# Patient Record
Sex: Female | Born: 1940 | Race: White | Hispanic: No | Marital: Married | Smoking: Former smoker
Health system: Southern US, Community
[De-identification: ages and names within clinical notes are randomized; demographics above are authoritative.]

## PROBLEM LIST (undated history)

## (undated) DIAGNOSIS — E039 Hypothyroidism, unspecified: Secondary | ICD-10-CM

## (undated) DIAGNOSIS — E785 Hyperlipidemia, unspecified: Secondary | ICD-10-CM

## (undated) DIAGNOSIS — G459 Transient cerebral ischemic attack, unspecified: Secondary | ICD-10-CM

## (undated) DIAGNOSIS — I639 Cerebral infarction, unspecified: Secondary | ICD-10-CM

## (undated) DIAGNOSIS — F0390 Unspecified dementia without behavioral disturbance: Secondary | ICD-10-CM

## (undated) DIAGNOSIS — G44209 Tension-type headache, unspecified, not intractable: Secondary | ICD-10-CM

## (undated) DIAGNOSIS — F39 Unspecified mood [affective] disorder: Secondary | ICD-10-CM

## (undated) DIAGNOSIS — I341 Nonrheumatic mitral (valve) prolapse: Secondary | ICD-10-CM

## (undated) DIAGNOSIS — R251 Tremor, unspecified: Secondary | ICD-10-CM

## (undated) DIAGNOSIS — G5600 Carpal tunnel syndrome, unspecified upper limb: Secondary | ICD-10-CM

## (undated) DIAGNOSIS — M1990 Unspecified osteoarthritis, unspecified site: Secondary | ICD-10-CM

## (undated) DIAGNOSIS — R2689 Other abnormalities of gait and mobility: Secondary | ICD-10-CM

## (undated) DIAGNOSIS — R221 Localized swelling, mass and lump, neck: Secondary | ICD-10-CM

## (undated) DIAGNOSIS — R600 Localized edema: Secondary | ICD-10-CM

## (undated) DIAGNOSIS — I10 Essential (primary) hypertension: Secondary | ICD-10-CM

## (undated) DIAGNOSIS — M5030 Other cervical disc degeneration, unspecified cervical region: Secondary | ICD-10-CM

## (undated) DIAGNOSIS — M75101 Unspecified rotator cuff tear or rupture of right shoulder, not specified as traumatic: Secondary | ICD-10-CM

## (undated) DIAGNOSIS — R413 Other amnesia: Secondary | ICD-10-CM

## (undated) DIAGNOSIS — K219 Gastro-esophageal reflux disease without esophagitis: Secondary | ICD-10-CM

## (undated) DIAGNOSIS — G540 Brachial plexus disorders: Secondary | ICD-10-CM

## (undated) DIAGNOSIS — M8580 Other specified disorders of bone density and structure, unspecified site: Secondary | ICD-10-CM

## (undated) DIAGNOSIS — M5430 Sciatica, unspecified side: Secondary | ICD-10-CM

## (undated) DIAGNOSIS — K589 Irritable bowel syndrome without diarrhea: Secondary | ICD-10-CM

## (undated) DIAGNOSIS — J302 Other seasonal allergic rhinitis: Secondary | ICD-10-CM

## (undated) DIAGNOSIS — M7520 Bicipital tendinitis, unspecified shoulder: Secondary | ICD-10-CM

## (undated) DIAGNOSIS — F419 Anxiety disorder, unspecified: Secondary | ICD-10-CM

## (undated) DIAGNOSIS — R609 Edema, unspecified: Secondary | ICD-10-CM

## (undated) DIAGNOSIS — M7541 Impingement syndrome of right shoulder: Secondary | ICD-10-CM

## (undated) HISTORY — DX: Other amnesia: R41.3

## (undated) HISTORY — DX: Tremor, unspecified: R25.1

## (undated) HISTORY — DX: Hyperlipidemia, unspecified: E78.5

## (undated) HISTORY — DX: Essential (primary) hypertension: I10

## (undated) HISTORY — DX: Other specified disorders of bone density and structure, unspecified site: M85.80

## (undated) HISTORY — DX: Localized swelling, mass and lump, neck: R22.1

## (undated) HISTORY — DX: Tension-type headache, unspecified, not intractable: G44.209

## (undated) HISTORY — DX: Gastro-esophageal reflux disease without esophagitis: K21.9

## (undated) HISTORY — DX: Other abnormalities of gait and mobility: R26.89

## (undated) HISTORY — PX: TONSILLECTOMY: SHX5217

## (undated) HISTORY — DX: Cerebral infarction, unspecified: I63.9

## (undated) HISTORY — DX: Other cervical disc degeneration, unspecified cervical region: M50.30

## (undated) HISTORY — PX: TUBAL LIGATION: SHX77

## (undated) HISTORY — DX: Carpal tunnel syndrome, unspecified upper limb: G56.00

## (undated) HISTORY — DX: Nonrheumatic mitral (valve) prolapse: I34.1

## (undated) HISTORY — DX: Sciatica, unspecified side: M54.30

## (undated) HISTORY — DX: Unspecified mood (affective) disorder: F39

## (undated) HISTORY — DX: Localized edema: R60.0

## (undated) HISTORY — DX: Edema, unspecified: R60.9

## (undated) HISTORY — DX: Unspecified osteoarthritis, unspecified site: M19.90

## (undated) HISTORY — PX: LASIK: SHX215

---
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
## 1993-09-27 DIAGNOSIS — G5600 Carpal tunnel syndrome, unspecified upper limb: Secondary | ICD-10-CM

## 1993-09-27 HISTORY — DX: Carpal tunnel syndrome, unspecified upper limb: G56.00

## 1998-09-27 HISTORY — PX: CARPAL TUNNEL RELEASE: SHX101

## 1999-08-10 ENCOUNTER — Ambulatory Visit (HOSPITAL_COMMUNITY): Admission: RE | Admit: 1999-08-10 | Discharge: 1999-08-10 | Payer: Self-pay | Admitting: Gastroenterology

## 1999-10-13 ENCOUNTER — Other Ambulatory Visit: Admission: RE | Admit: 1999-10-13 | Discharge: 1999-10-13 | Payer: Self-pay | Admitting: Gynecology

## 2000-10-13 ENCOUNTER — Other Ambulatory Visit: Admission: RE | Admit: 2000-10-13 | Discharge: 2000-10-13 | Payer: Self-pay | Admitting: Gynecology

## 2001-11-08 ENCOUNTER — Other Ambulatory Visit: Admission: RE | Admit: 2001-11-08 | Discharge: 2001-11-08 | Payer: Self-pay | Admitting: Gynecology

## 2002-02-07 ENCOUNTER — Ambulatory Visit (HOSPITAL_COMMUNITY): Admission: RE | Admit: 2002-02-07 | Discharge: 2002-02-07 | Payer: Self-pay | Admitting: Gastroenterology

## 2002-02-07 ENCOUNTER — Encounter (INDEPENDENT_AMBULATORY_CARE_PROVIDER_SITE_OTHER): Payer: Self-pay | Admitting: *Deleted

## 2002-02-23 ENCOUNTER — Encounter: Payer: Self-pay | Admitting: Gastroenterology

## 2002-02-23 ENCOUNTER — Encounter: Admission: RE | Admit: 2002-02-23 | Discharge: 2002-02-23 | Payer: Self-pay | Admitting: Gastroenterology

## 2002-09-27 HISTORY — PX: CARDIAC CATHETERIZATION: SHX172

## 2002-10-02 ENCOUNTER — Other Ambulatory Visit: Admission: RE | Admit: 2002-10-02 | Discharge: 2002-10-02 | Payer: Self-pay | Admitting: Gynecology

## 2003-03-20 ENCOUNTER — Encounter: Payer: Self-pay | Admitting: Neurosurgery

## 2003-03-20 ENCOUNTER — Ambulatory Visit (HOSPITAL_COMMUNITY): Admission: RE | Admit: 2003-03-20 | Discharge: 2003-03-20 | Payer: Self-pay | Admitting: Neurosurgery

## 2003-07-26 ENCOUNTER — Ambulatory Visit (HOSPITAL_COMMUNITY): Admission: RE | Admit: 2003-07-26 | Discharge: 2003-07-26 | Payer: Self-pay | Admitting: Cardiovascular Disease

## 2003-10-28 ENCOUNTER — Other Ambulatory Visit: Admission: RE | Admit: 2003-10-28 | Discharge: 2003-10-28 | Payer: Self-pay | Admitting: Gynecology

## 2004-11-20 ENCOUNTER — Other Ambulatory Visit: Admission: RE | Admit: 2004-11-20 | Discharge: 2004-11-20 | Payer: Self-pay | Admitting: *Deleted

## 2005-09-03 ENCOUNTER — Encounter: Admission: RE | Admit: 2005-09-03 | Discharge: 2005-09-03 | Payer: Self-pay | Admitting: Orthopedic Surgery

## 2005-10-15 ENCOUNTER — Encounter: Admission: RE | Admit: 2005-10-15 | Discharge: 2005-10-15 | Payer: Self-pay | Admitting: Gastroenterology

## 2006-09-30 ENCOUNTER — Encounter: Admission: RE | Admit: 2006-09-30 | Discharge: 2006-09-30 | Payer: Self-pay | Admitting: Gastroenterology

## 2006-10-24 ENCOUNTER — Encounter: Admission: RE | Admit: 2006-10-24 | Discharge: 2006-10-24 | Payer: Self-pay | Admitting: Gastroenterology

## 2006-12-15 ENCOUNTER — Other Ambulatory Visit: Admission: RE | Admit: 2006-12-15 | Discharge: 2006-12-15 | Payer: Self-pay | Admitting: Obstetrics & Gynecology

## 2008-09-27 HISTORY — PX: COSMETIC SURGERY: SHX468

## 2008-12-25 ENCOUNTER — Other Ambulatory Visit: Admission: RE | Admit: 2008-12-25 | Discharge: 2008-12-25 | Payer: Self-pay | Admitting: Obstetrics & Gynecology

## 2009-06-03 ENCOUNTER — Encounter: Admission: RE | Admit: 2009-06-03 | Discharge: 2009-06-03 | Payer: Self-pay | Admitting: Orthopedic Surgery

## 2009-08-01 ENCOUNTER — Encounter: Admission: RE | Admit: 2009-08-01 | Discharge: 2009-08-01 | Payer: Self-pay | Admitting: Internal Medicine

## 2009-09-05 ENCOUNTER — Encounter: Admission: RE | Admit: 2009-09-05 | Discharge: 2009-09-05 | Payer: Self-pay | Admitting: Allergy

## 2009-10-20 ENCOUNTER — Encounter: Admission: RE | Admit: 2009-10-20 | Discharge: 2009-10-20 | Payer: Self-pay | Admitting: Gastroenterology

## 2009-12-30 ENCOUNTER — Encounter: Admission: RE | Admit: 2009-12-30 | Discharge: 2009-12-30 | Payer: Self-pay | Admitting: Gastroenterology

## 2010-10-18 ENCOUNTER — Encounter: Payer: Self-pay | Admitting: Gastroenterology

## 2010-10-18 ENCOUNTER — Encounter: Payer: Self-pay | Admitting: Allergy

## 2010-12-16 ENCOUNTER — Other Ambulatory Visit: Payer: Self-pay | Admitting: Neurosurgery

## 2010-12-16 DIAGNOSIS — M545 Low back pain, unspecified: Secondary | ICD-10-CM

## 2010-12-16 DIAGNOSIS — M47812 Spondylosis without myelopathy or radiculopathy, cervical region: Secondary | ICD-10-CM

## 2010-12-23 ENCOUNTER — Ambulatory Visit
Admission: RE | Admit: 2010-12-23 | Discharge: 2010-12-23 | Disposition: A | Discharge status: Home / Self Care | Payer: Medicare Other | Source: Ambulatory Visit | Attending: Neurosurgery | Admitting: Neurosurgery

## 2010-12-23 DIAGNOSIS — M47812 Spondylosis without myelopathy or radiculopathy, cervical region: Secondary | ICD-10-CM

## 2010-12-23 DIAGNOSIS — M545 Low back pain, unspecified: Secondary | ICD-10-CM

## 2011-02-12 NOTE — H&P (Signed)
NAMEANNALEA, Bianca Garcia                        ACCOUNT NO.:  [id number]   MEDICAL RECORD NO.:  [id number]                   PATIENT TYPE:  OIB   LOCATION:  NA                                   FACILITY:  [facility]   PHYSICIAN:  Vesta Mixer, M.D.              DATE OF BIRTH:  11-06-1940   DATE OF ADMISSION:  07/25/2003  DATE OF DISCHARGE:                                HISTORY & PHYSICAL   HISTORY OF PRESENT ILLNESS:  Ms. Bianca Garcia is a 70 year old female with a  history of mitral valve prolapse.  She presents today for evaluation of some  chest pains.   Bianca Garcia has had intermittent episodes of chest pains over the past several  years, usually related to musculoskeletal pain or perhaps mitral valve  prolapse.  Over the past several days she has had a somewhat unusual chest  pain.  These episodes of chest pain are described as a burning pain in the  middle of her chest with radiation to both shoulders and then to the  intrascapular region.  They typically occur at various times and are not  necessarily associated with eating, drinking, changing position, or taking a  deep breath.  She was asked to stop doing whatever she was doing until they  resolved.   She presented to the office today and had one of these similar episodes of  chest pain.  We gave her a nitroglycerin and the chest pain resolved fairly  quickly.  In addition, when she first arrived she had an EKG which revealed  some ST segment depression which was new compared to a previous EKG several  months ago.  For this reason, she is admitted for heart catheterization.   The patient denies any symptoms of congestive heart failure, syncope, or  presyncope.  She denies any further cardiac symptoms.   CURRENT MEDICATIONS:  1. Synthroid 0.088 mg a day.  2. Fosamax once a day.  3. Vitamin E once a day.  4. Fish oil capsules once a day.   ALLERGIES:  1. PENICILLIN.  2. CODEINE.   PAST MEDICAL HISTORY:  1.  Hypothyroidism.  2. Recent onset of chest pain.   SOCIAL HISTORY:  The patient works in [employer], [employer].  She used  to smoke many years ago, but quit at an early age.  She drinks alcohol  occasionally.   FAMILY HISTORY:  Her father died at age 51 due to congestive heart failure.  He had aortic stenosis.  Her mother is 26 years old and has a history of  hypertension.   REVIEW OF SYSTEMS:  Was reviewed and is essentially negative.  She denies  any heat or cold intolerance, weight gain, weight loss.  She denies any  cough or sputum production.  She denies any problems with her GI or GU  tract.  She denies any rash or skin nodules.   PHYSICAL EXAMINATION:  GENERAL:  She is a middle-aged female in no acute  distress.  She is alert and oriented x3, and her mood and affect are normal.  VITAL SIGNS:  Her blood pressure is 130/80 with a heart rate of 71, weight  is 125.  HEENT:  Reveals 2+ carotids.  She has no bruits.  There is no JVD, no  thyromegaly.  LUNGS:  Clear to auscultation.  HEART:  Regular rate, S1 and S2.  She has a soft midsystolic click.  She has  a soft systolic murmur.  ABDOMEN:  Good bowel sounds and is nontender.  EXTREMITIES:  She has no cyanosis, clubbing, or edema.  NEUROLOGIC:  Nonfocal.  Her cranial nerves II-XII are intact, and her motor  and sensory function are intact.   LABORATORY DATA:  Her EKG from April 25, 2002, reveals normal sinus rhythm  with no ST or T-wave changes.  Her EKG from today reveals normal sinus  rhythm.  She has a very subtle, but a definite ST segment depression in the  inferior and lateral leads which is new from her previous EKG last year.  As  mentioned above, she had a nitroglycerin in the office which successfully  relieved her episodes of chest pain.   Laboratory data is pending.   Bianca Garcia presents with some episodes of chest pain that are associated with  ST segment depression as well as the fact that they are  relieved with  nitroglycerin.  I suspect that she may have a coronary stenosis.  We have  discussed the risks, benefits, and options of heart catheterization.  She  understands and agrees to proceed.  All other her other medical problems are  stable.                                                 Vesta Mixer, M.D.    PJN/MEDQ  D:  07/25/2003  T:  07/25/2003  Job:  [id number]   cc:   Bianca Garcia, M.D.  [address]  [place]  [place] [address]  Fax: [phone]

## 2011-02-12 NOTE — Procedures (Signed)
[facility]  Patient:    Bianca Garcia, Bianca Garcia Visit Number: [id number] MRN: [id number]          Service Type: END Location: ENDO Attending Physician:  Nelda Marseille Dictated by:   Petra Kuba, M.D. Proc. Date: 02/07/02 Admit Date:  02/07/2002   CC:         Jeannett Senior A. Evlyn Kanner, M.D.   Procedure Report  PROCEDURE:  Colonoscopy with biopsy.  INDICATION:  Patient with bloody diarrhea.  Cultures nondiagnostic currently. Want to rule out ischemic colitis or other etiologies.  Consent was signed after risks, benefits, methods, and options thoroughly discussed in the office.  MEDICATIONS:  Demerol 80 mg, Versed 8 mg.  DESCRIPTION OF PROCEDURE:  Rectal inspection was pertinent for external hemorrhoids.  Digital exam was negative.  The pediatric video adjustable colonoscope was inserted, easily advanced around the colon to the cecum.  It did require rolling her on her back and some abdominal pressures.  No obvious abnormality was seen on insertion.  The cecum was identified by the appendiceal orifice and the ileocecal valve.  In fact, the scope was inserted a short way into the terminal ileum, which was normal.  A few biopsies were obtained.  Photo documentation was obtained.  The scope was slowly withdrawn. On slow withdrawal through the colon, no abnormalities were seen, specifically no signs of ischemia, colitis, polyps, masses, or other abnormalities or even any diverticula.  Random scattered occasional biopsies were obtained.  Prep was adequate.  There was some liquid stool that required washing and suctioning.  Once back in the rectum the scope was retroflexed, pertinent for some tiny internal hemorrhoids.  The scope was straightened and readvanced a short way up the left side of the colon, air was suctioned, and the scope removed.  The patient tolerated the procedure well.  There was no obvious immediate complication.  ENDOSCOPIC  DIAGNOSES: 1. Internal-external hemorrhoids. 2. Otherwise within normal limits to the terminal ileum, status post random    biopsy throughout.  PLAN:  Await pathology and final culture results.  Follow up p.r.n. or in one month and touch base with her with the above results, recheck symptoms, and make sure no further workup plans are needed. Dictated by:   Petra Kuba, M.D. Attending Physician:  Nelda Marseille DD:  02/07/02 TD:  02/08/02 Job: [id number]

## 2011-02-12 NOTE — Cardiovascular Report (Signed)
   Bianca Garcia, Bianca Garcia                        ACCOUNT NO.:  [id number]   MEDICAL RECORD NO.:  [id number]                   PATIENT TYPE:  OIB   LOCATION:  2869                                 FACILITY:  [facility]   PHYSICIAN:  Vesta Mixer, M.D.              DATE OF BIRTH:  June 26, 1941   DATE OF PROCEDURE:  07/26/2003  DATE OF DISCHARGE:                              CARDIAC CATHETERIZATION   INDICATIONS:  Bianca Garcia is a 70 year old female who was seen in the office  yesterday with episodes of chest pain and ST segment depression.  The chest  pain resolved with nitroglycerin.  She was referred for a heart  catheterization for further evaluation.   PROCEDURE:  Left heart catheterization with coronary angiography.   The right femoral artery was easily cannulated using a modified Seldinger  technique.   HEMODYNAMICS:  The LV pressure was 135/9 with an aortic pressure of 134/70.   ANGIOGRAPHY:  The left main coronary artery is smooth and normal.   The left anterior descending artery is smooth and normal throughout its  course.  The diagonal vessel is normal.   The left circumflex artery is a moderate sized vessel.  It gives off several  marginal branches and several posterolateral branches.  They are all normal.   The right coronary artery is large and dominant.  It is fairly smooth and  normal throughout its course.  The posterior descending artery and the  posterolateral segment artery are normal.   LEFT VENTRICULOGRAM:  The left ventriculogram was performed in a 30 RAO  position.  It reveals overall normal left ventricular systolic function.  The ejection fraction is between 65-70%.  There was a small amount of mitral  regurgitation which appeared to be due to the dye injection, although I  cannot exclude a trace amount of mitral regurgitation at baseline.   COMPLICATIONS:  None.    CONCLUSION:  1. Smooth and normal coronary arteries.  2. Normal left ventricular systolic  function.  Will continue to watch her     closely.                                               Vesta Mixer, M.D.    PJN/MEDQ  D:  07/26/2003  T:  07/26/2003  Job:  [id number]   cc:   Larina Earthly, M.D.  [address]  [place]  [place] [address]  Fax: [phone]

## 2011-07-20 ENCOUNTER — Other Ambulatory Visit: Payer: Self-pay | Admitting: Gastroenterology

## 2011-07-22 ENCOUNTER — Ambulatory Visit
Admission: RE | Admit: 2011-07-22 | Discharge: 2011-07-22 | Disposition: A | Discharge status: Home / Self Care | Payer: Medicare Other | Source: Ambulatory Visit | Attending: Gastroenterology | Admitting: Gastroenterology

## 2011-10-06 DIAGNOSIS — E785 Hyperlipidemia, unspecified: Secondary | ICD-10-CM | POA: Diagnosis not present

## 2011-10-06 DIAGNOSIS — M899 Disorder of bone, unspecified: Secondary | ICD-10-CM | POA: Diagnosis not present

## 2011-10-06 DIAGNOSIS — M949 Disorder of cartilage, unspecified: Secondary | ICD-10-CM | POA: Diagnosis not present

## 2011-10-06 DIAGNOSIS — Z23 Encounter for immunization: Secondary | ICD-10-CM | POA: Diagnosis not present

## 2011-10-06 DIAGNOSIS — E039 Hypothyroidism, unspecified: Secondary | ICD-10-CM | POA: Diagnosis not present

## 2011-10-06 DIAGNOSIS — I10 Essential (primary) hypertension: Secondary | ICD-10-CM | POA: Diagnosis not present

## 2011-10-12 DIAGNOSIS — M25819 Other specified joint disorders, unspecified shoulder: Secondary | ICD-10-CM | POA: Diagnosis not present

## 2011-10-13 DIAGNOSIS — G44209 Tension-type headache, unspecified, not intractable: Secondary | ICD-10-CM | POA: Diagnosis not present

## 2011-10-13 DIAGNOSIS — Z1212 Encounter for screening for malignant neoplasm of rectum: Secondary | ICD-10-CM | POA: Diagnosis not present

## 2011-10-13 DIAGNOSIS — Z0000 Encounter for general adult medical examination without abnormal findings: Secondary | ICD-10-CM | POA: Diagnosis not present

## 2011-10-13 DIAGNOSIS — K219 Gastro-esophageal reflux disease without esophagitis: Secondary | ICD-10-CM | POA: Diagnosis not present

## 2011-10-13 DIAGNOSIS — J309 Allergic rhinitis, unspecified: Secondary | ICD-10-CM | POA: Diagnosis not present

## 2011-10-15 DIAGNOSIS — T7840XA Allergy, unspecified, initial encounter: Secondary | ICD-10-CM | POA: Diagnosis not present

## 2011-10-28 DIAGNOSIS — H353190 Nonexudative age-related macular degeneration, unspecified eye, stage unspecified: Secondary | ICD-10-CM | POA: Diagnosis not present

## 2011-11-04 ENCOUNTER — Other Ambulatory Visit: Payer: Self-pay | Admitting: *Deleted

## 2011-12-07 ENCOUNTER — Telehealth: Payer: Self-pay | Admitting: Cardiovascular Disease

## 2011-12-09 NOTE — Telephone Encounter (Signed)
Close  

## 2011-12-20 ENCOUNTER — Other Ambulatory Visit: Payer: Self-pay | Admitting: Dermatology

## 2011-12-20 DIAGNOSIS — C44529 Squamous cell carcinoma of skin of other part of trunk: Secondary | ICD-10-CM | POA: Diagnosis not present

## 2011-12-20 DIAGNOSIS — L299 Pruritus, unspecified: Secondary | ICD-10-CM | POA: Diagnosis not present

## 2011-12-20 DIAGNOSIS — L570 Actinic keratosis: Secondary | ICD-10-CM | POA: Diagnosis not present

## 2011-12-20 DIAGNOSIS — D239 Other benign neoplasm of skin, unspecified: Secondary | ICD-10-CM | POA: Diagnosis not present

## 2011-12-20 DIAGNOSIS — D0460 Carcinoma in situ of skin of unspecified upper limb, including shoulder: Secondary | ICD-10-CM | POA: Diagnosis not present

## 2011-12-20 DIAGNOSIS — L821 Other seborrheic keratosis: Secondary | ICD-10-CM | POA: Diagnosis not present

## 2011-12-20 DIAGNOSIS — L259 Unspecified contact dermatitis, unspecified cause: Secondary | ICD-10-CM | POA: Diagnosis not present

## 2011-12-21 ENCOUNTER — Encounter: Payer: Self-pay | Admitting: *Deleted

## 2011-12-22 ENCOUNTER — Encounter: Payer: Self-pay | Admitting: Cardiovascular Disease

## 2011-12-22 ENCOUNTER — Ambulatory Visit (INDEPENDENT_AMBULATORY_CARE_PROVIDER_SITE_OTHER): Payer: Medicare Other | Admitting: Cardiovascular Disease

## 2011-12-22 VITALS — BP 117/69 | HR 78 | Ht 61.5 in | Wt 118.0 lb

## 2011-12-22 DIAGNOSIS — I10 Essential (primary) hypertension: Secondary | ICD-10-CM | POA: Insufficient documentation

## 2011-12-22 DIAGNOSIS — E785 Hyperlipidemia, unspecified: Secondary | ICD-10-CM | POA: Diagnosis not present

## 2011-12-22 NOTE — Progress Notes (Signed)
Elray Buba Date of Birth  01/24/1941 [facility]     [place] [facility]  [address]    [address]   [address] [place], [place]  [address]    [place], [place]  [address] [phone]  Fax  [phone]  [phone]  Fax [phone]  Problem list: 1. History of chest pain-normal coronary artery heart catheterization 2. Hyperlipidemia 3. Hypertension 4. Hypothyroidism  History of Present Illness:  Pt is doing well from a cardiac standpoint.  She continues to have anxiety.  She takes Klonipin on occasion which helps.  She has not had any cardiac problems.   Current Outpatient Prescriptions  Medication Sig Dispense Refill  . aspirin 81 MG tablet Take 81 mg by mouth daily.      . Calcium Carbonate-Vitamin D (CALCIUM + D PO) Take by mouth daily.      . cetirizine (ZYRTEC) 10 MG tablet Take 10 mg by mouth daily.      . ClonazePAM (KLONOPIN PO) Take by mouth as needed.      . cycloSPORINE (RESTASIS) 0.05 % ophthalmic emulsion Place 1 drop into both eyes daily.      Marland Kitchen levothyroxine (SYNTHROID, LEVOTHROID) 100 MCG tablet Take 100 mcg by mouth daily.      . Multiple Vitamins-Minerals (MULTIVITAMIN PO) Take by mouth daily.      . Multiple Vitamins-Minerals (PRESERVISION AREDS PO) Take by mouth daily.      . Naproxen Sodium (ALEVE PO) Take by mouth as needed.      Marland Kitchen OMEPRAZOLE PO Take 20 mg by mouth daily.          Allergies  Allergen Reactions  . Codeine   . Crestor (Rosuvastatin Calcium)     Intolerant   . Lipitor (Atorvastatin Calcium)     Intolerant   . Penicillins     Past Medical History  Diagnosis Date  . Chest pain   . HTN (hypertension)   . Hypercholesterolemia   . Normal coronary arteries     by heart Cath  . MVP (mitral valve prolapse)     Hx of     Past Surgical History  Procedure Date  . Cardiac catheterization     which revealed smooth and normal coronary arteries  . Lasik   . Tonsillectomy   . Tubal ligation     History    Smoking status  . Former Smoker  Smokeless tobacco  . Not on file  Comment: Remote Hx    History  Alcohol Use: Not on file    No family history on file.  Reviw of Systems:  Reviewed in the HPI.  All other systems are negative.  Physical Exam: Blood pressure 117/69, pulse 78, height 5' 1.5" (1.562 m), weight 118 lb (53.524 kg). General: Well developed, well nourished, in no acute distress.  Head: Normocephalic, atraumatic, sclera non-icteric, mucus membranes are moist,   Neck: Supple. Carotids are 2 + without bruits. No JVD  Lungs: Clear bilaterally to auscultation.  Heart: regular rate.  normal  S1 S2. No murmurs, gallops or rubs.  Abdomen: Soft, non-tender, non-distended with normal bowel sounds. No hepatomegaly. No rebound/guarding. No masses.  Msk:  Strength and tone are normal  Extremities: No clubbing or cyanosis. No edema.  Distal pedal pulses are 2+ and equal bilaterally.  Neuro: Alert and oriented X 3. Moves all extremities spontaneously.  Psych:  Responds to questions appropriately with a normal affect.  ECG: 12/22/2011. NSR, non specific ST abnormalities.  Assessment / Plan:

## 2011-12-22 NOTE — Assessment & Plan Note (Signed)
She is doing well.  Normal BP.   Will see her in 1 year.  She will brings labs from Dr. Vicente Males office.

## 2011-12-22 NOTE — Patient Instructions (Addendum)
See Orbie Hurst for anxiety.  [phone].   Your physician wants you to follow-up in: 1 YEAR OR SOONER IF NEED,  You will receive a reminder letter in the mail two months in advance. If you don't receive a letter, please call our office to schedule the follow-up appointment.  Your physician recommends that you return for a FASTING lipid profile: 1 YEAR, OR PLEASE PROVIDE [name] WITH COPIES FOR YOUR CHART HERE. THANK YOU

## 2011-12-22 NOTE — Assessment & Plan Note (Signed)
She will bring labs labs from [facility]'s office.

## 2011-12-27 ENCOUNTER — Encounter: Payer: Self-pay | Admitting: Cardiovascular Disease

## 2012-01-07 DIAGNOSIS — C44611 Basal cell carcinoma of skin of unspecified upper limb, including shoulder: Secondary | ICD-10-CM | POA: Diagnosis not present

## 2012-01-13 DIAGNOSIS — Z1231 Encounter for screening mammogram for malignant neoplasm of breast: Secondary | ICD-10-CM | POA: Diagnosis not present

## 2012-01-13 DIAGNOSIS — R928 Other abnormal and inconclusive findings on diagnostic imaging of breast: Secondary | ICD-10-CM | POA: Diagnosis not present

## 2012-01-31 DIAGNOSIS — H353190 Nonexudative age-related macular degeneration, unspecified eye, stage unspecified: Secondary | ICD-10-CM | POA: Diagnosis not present

## 2012-03-08 DIAGNOSIS — G44209 Tension-type headache, unspecified, not intractable: Secondary | ICD-10-CM | POA: Diagnosis not present

## 2012-03-08 DIAGNOSIS — H6120 Impacted cerumen, unspecified ear: Secondary | ICD-10-CM | POA: Diagnosis not present

## 2012-03-08 DIAGNOSIS — R079 Chest pain, unspecified: Secondary | ICD-10-CM | POA: Diagnosis not present

## 2012-03-08 DIAGNOSIS — I10 Essential (primary) hypertension: Secondary | ICD-10-CM | POA: Diagnosis not present

## 2012-03-08 DIAGNOSIS — E039 Hypothyroidism, unspecified: Secondary | ICD-10-CM | POA: Diagnosis not present

## 2012-03-09 DIAGNOSIS — L910 Hypertrophic scar: Secondary | ICD-10-CM | POA: Diagnosis not present

## 2012-03-09 DIAGNOSIS — Z01419 Encounter for gynecological examination (general) (routine) without abnormal findings: Secondary | ICD-10-CM | POA: Diagnosis not present

## 2012-03-09 DIAGNOSIS — Z0000 Encounter for general adult medical examination without abnormal findings: Secondary | ICD-10-CM | POA: Diagnosis not present

## 2012-03-09 DIAGNOSIS — L723 Sebaceous cyst: Secondary | ICD-10-CM | POA: Diagnosis not present

## 2012-03-09 DIAGNOSIS — Z124 Encounter for screening for malignant neoplasm of cervix: Secondary | ICD-10-CM | POA: Diagnosis not present

## 2012-03-21 DIAGNOSIS — H43399 Other vitreous opacities, unspecified eye: Secondary | ICD-10-CM | POA: Insufficient documentation

## 2012-03-21 DIAGNOSIS — D3130 Benign neoplasm of unspecified choroid: Secondary | ICD-10-CM | POA: Insufficient documentation

## 2012-03-21 DIAGNOSIS — H3530 Unspecified macular degeneration: Secondary | ICD-10-CM | POA: Insufficient documentation

## 2012-03-21 DIAGNOSIS — H35369 Drusen (degenerative) of macula, unspecified eye: Secondary | ICD-10-CM | POA: Insufficient documentation

## 2012-03-21 DIAGNOSIS — H353190 Nonexudative age-related macular degeneration, unspecified eye, stage unspecified: Secondary | ICD-10-CM | POA: Diagnosis not present

## 2012-04-24 DIAGNOSIS — Z86010 Personal history of colonic polyps: Secondary | ICD-10-CM | POA: Diagnosis not present

## 2012-04-24 DIAGNOSIS — Z09 Encounter for follow-up examination after completed treatment for conditions other than malignant neoplasm: Secondary | ICD-10-CM | POA: Diagnosis not present

## 2012-05-23 DIAGNOSIS — H9209 Otalgia, unspecified ear: Secondary | ICD-10-CM | POA: Diagnosis not present

## 2012-05-23 DIAGNOSIS — H6120 Impacted cerumen, unspecified ear: Secondary | ICD-10-CM | POA: Diagnosis not present

## 2012-05-23 DIAGNOSIS — J309 Allergic rhinitis, unspecified: Secondary | ICD-10-CM | POA: Diagnosis not present

## 2012-05-23 DIAGNOSIS — G44209 Tension-type headache, unspecified, not intractable: Secondary | ICD-10-CM | POA: Diagnosis not present

## 2012-06-05 ENCOUNTER — Encounter: Payer: Self-pay | Admitting: Cardiovascular Disease

## 2012-06-07 DIAGNOSIS — I10 Essential (primary) hypertension: Secondary | ICD-10-CM | POA: Diagnosis not present

## 2012-06-07 DIAGNOSIS — F39 Unspecified mood [affective] disorder: Secondary | ICD-10-CM | POA: Diagnosis not present

## 2012-06-07 DIAGNOSIS — E039 Hypothyroidism, unspecified: Secondary | ICD-10-CM | POA: Diagnosis not present

## 2012-06-07 DIAGNOSIS — G44209 Tension-type headache, unspecified, not intractable: Secondary | ICD-10-CM | POA: Diagnosis not present

## 2012-07-06 DIAGNOSIS — Z23 Encounter for immunization: Secondary | ICD-10-CM | POA: Diagnosis not present

## 2012-07-25 DIAGNOSIS — H353190 Nonexudative age-related macular degeneration, unspecified eye, stage unspecified: Secondary | ICD-10-CM | POA: Diagnosis not present

## 2012-07-25 DIAGNOSIS — H43399 Other vitreous opacities, unspecified eye: Secondary | ICD-10-CM | POA: Diagnosis not present

## 2012-07-25 DIAGNOSIS — H35369 Drusen (degenerative) of macula, unspecified eye: Secondary | ICD-10-CM | POA: Diagnosis not present

## 2012-07-25 DIAGNOSIS — D3130 Benign neoplasm of unspecified choroid: Secondary | ICD-10-CM | POA: Diagnosis not present

## 2012-08-09 DIAGNOSIS — M67919 Unspecified disorder of synovium and tendon, unspecified shoulder: Secondary | ICD-10-CM | POA: Diagnosis not present

## 2012-08-18 DIAGNOSIS — Z85828 Personal history of other malignant neoplasm of skin: Secondary | ICD-10-CM | POA: Diagnosis not present

## 2012-08-18 DIAGNOSIS — L919 Hypertrophic disorder of the skin, unspecified: Secondary | ICD-10-CM | POA: Diagnosis not present

## 2012-08-18 DIAGNOSIS — L820 Inflamed seborrheic keratosis: Secondary | ICD-10-CM | POA: Diagnosis not present

## 2012-08-18 DIAGNOSIS — L909 Atrophic disorder of skin, unspecified: Secondary | ICD-10-CM | POA: Diagnosis not present

## 2012-08-18 DIAGNOSIS — L821 Other seborrheic keratosis: Secondary | ICD-10-CM | POA: Diagnosis not present

## 2012-08-18 DIAGNOSIS — L570 Actinic keratosis: Secondary | ICD-10-CM | POA: Diagnosis not present

## 2012-09-04 DIAGNOSIS — R1013 Epigastric pain: Secondary | ICD-10-CM | POA: Diagnosis not present

## 2012-09-04 DIAGNOSIS — M546 Pain in thoracic spine: Secondary | ICD-10-CM | POA: Diagnosis not present

## 2012-09-04 DIAGNOSIS — R198 Other specified symptoms and signs involving the digestive system and abdomen: Secondary | ICD-10-CM | POA: Diagnosis not present

## 2012-09-04 DIAGNOSIS — M1990 Unspecified osteoarthritis, unspecified site: Secondary | ICD-10-CM | POA: Diagnosis not present

## 2012-09-04 DIAGNOSIS — M542 Cervicalgia: Secondary | ICD-10-CM | POA: Diagnosis not present

## 2012-09-04 DIAGNOSIS — M5030 Other cervical disc degeneration, unspecified cervical region: Secondary | ICD-10-CM | POA: Diagnosis not present

## 2012-09-18 DIAGNOSIS — R0602 Shortness of breath: Secondary | ICD-10-CM | POA: Diagnosis not present

## 2012-09-18 DIAGNOSIS — J209 Acute bronchitis, unspecified: Secondary | ICD-10-CM | POA: Diagnosis not present

## 2012-09-18 DIAGNOSIS — R509 Fever, unspecified: Secondary | ICD-10-CM | POA: Diagnosis not present

## 2012-09-18 DIAGNOSIS — J309 Allergic rhinitis, unspecified: Secondary | ICD-10-CM | POA: Diagnosis not present

## 2012-10-03 DIAGNOSIS — I10 Essential (primary) hypertension: Secondary | ICD-10-CM | POA: Diagnosis not present

## 2012-10-03 DIAGNOSIS — E039 Hypothyroidism, unspecified: Secondary | ICD-10-CM | POA: Diagnosis not present

## 2012-10-03 DIAGNOSIS — E785 Hyperlipidemia, unspecified: Secondary | ICD-10-CM | POA: Diagnosis not present

## 2012-10-03 DIAGNOSIS — M899 Disorder of bone, unspecified: Secondary | ICD-10-CM | POA: Diagnosis not present

## 2012-10-09 DIAGNOSIS — J209 Acute bronchitis, unspecified: Secondary | ICD-10-CM | POA: Diagnosis not present

## 2012-10-09 DIAGNOSIS — R0989 Other specified symptoms and signs involving the circulatory and respiratory systems: Secondary | ICD-10-CM | POA: Diagnosis not present

## 2012-10-09 DIAGNOSIS — Z0000 Encounter for general adult medical examination without abnormal findings: Secondary | ICD-10-CM | POA: Diagnosis not present

## 2012-10-09 DIAGNOSIS — Z1331 Encounter for screening for depression: Secondary | ICD-10-CM | POA: Diagnosis not present

## 2012-10-11 DIAGNOSIS — Z1212 Encounter for screening for malignant neoplasm of rectum: Secondary | ICD-10-CM | POA: Diagnosis not present

## 2012-10-11 DIAGNOSIS — M47812 Spondylosis without myelopathy or radiculopathy, cervical region: Secondary | ICD-10-CM | POA: Diagnosis not present

## 2012-10-13 ENCOUNTER — Ambulatory Visit (INDEPENDENT_AMBULATORY_CARE_PROVIDER_SITE_OTHER): Payer: Medicare Other | Admitting: Vascular Surgery

## 2012-10-13 DIAGNOSIS — I6529 Occlusion and stenosis of unspecified carotid artery: Secondary | ICD-10-CM

## 2012-10-13 DIAGNOSIS — R0989 Other specified symptoms and signs involving the circulatory and respiratory systems: Secondary | ICD-10-CM | POA: Diagnosis not present

## 2012-11-28 DIAGNOSIS — H43399 Other vitreous opacities, unspecified eye: Secondary | ICD-10-CM | POA: Diagnosis not present

## 2012-11-28 DIAGNOSIS — D3130 Benign neoplasm of unspecified choroid: Secondary | ICD-10-CM | POA: Diagnosis not present

## 2012-11-28 DIAGNOSIS — H3530 Unspecified macular degeneration: Secondary | ICD-10-CM | POA: Diagnosis not present

## 2012-11-28 DIAGNOSIS — H35369 Drusen (degenerative) of macula, unspecified eye: Secondary | ICD-10-CM | POA: Diagnosis not present

## 2012-12-04 ENCOUNTER — Emergency Department (HOSPITAL_COMMUNITY): Payer: Medicare Other

## 2012-12-04 ENCOUNTER — Emergency Department (HOSPITAL_COMMUNITY)
Admission: EM | Admit: 2012-12-04 | Discharge: 2012-12-05 | Disposition: A | Discharge status: Home / Self Care | Payer: Medicare Other | Attending: Emergency Medicine | Admitting: Emergency Medicine

## 2012-12-04 DIAGNOSIS — S43006A Unspecified dislocation of unspecified shoulder joint, initial encounter: Secondary | ICD-10-CM | POA: Diagnosis not present

## 2012-12-04 DIAGNOSIS — I10 Essential (primary) hypertension: Secondary | ICD-10-CM | POA: Insufficient documentation

## 2012-12-04 DIAGNOSIS — Z79899 Other long term (current) drug therapy: Secondary | ICD-10-CM | POA: Insufficient documentation

## 2012-12-04 DIAGNOSIS — Z7982 Long term (current) use of aspirin: Secondary | ICD-10-CM | POA: Insufficient documentation

## 2012-12-04 DIAGNOSIS — S43004A Unspecified dislocation of right shoulder joint, initial encounter: Secondary | ICD-10-CM

## 2012-12-04 DIAGNOSIS — Z8679 Personal history of other diseases of the circulatory system: Secondary | ICD-10-CM | POA: Insufficient documentation

## 2012-12-04 DIAGNOSIS — S43016A Anterior dislocation of unspecified humerus, initial encounter: Secondary | ICD-10-CM | POA: Diagnosis not present

## 2012-12-04 DIAGNOSIS — W010XXA Fall on same level from slipping, tripping and stumbling without subsequent striking against object, initial encounter: Secondary | ICD-10-CM | POA: Insufficient documentation

## 2012-12-04 DIAGNOSIS — Z87891 Personal history of nicotine dependence: Secondary | ICD-10-CM | POA: Diagnosis not present

## 2012-12-04 DIAGNOSIS — E7800 Pure hypercholesterolemia, unspecified: Secondary | ICD-10-CM | POA: Insufficient documentation

## 2012-12-04 DIAGNOSIS — Y92009 Unspecified place in unspecified non-institutional (private) residence as the place of occurrence of the external cause: Secondary | ICD-10-CM | POA: Insufficient documentation

## 2012-12-04 DIAGNOSIS — Y939 Activity, unspecified: Secondary | ICD-10-CM | POA: Insufficient documentation

## 2012-12-04 MED ORDER — HYDROMORPHONE HCL PF 1 MG/ML IJ SOLN
1.0000 mg | Freq: Once | INTRAMUSCULAR | Status: AC
  Administered 2012-12-04: 1 mg via INTRAVENOUS
  Filled 2012-12-04: qty 1

## 2012-12-04 MED ORDER — ETOMIDATE 2 MG/ML IV SOLN
8.0000 mg | Freq: Once | INTRAVENOUS | Status: AC
  Administered 2012-12-05: 8 mg via INTRAVENOUS
  Filled 2012-12-04: qty 10

## 2012-12-04 MED ORDER — SODIUM CHLORIDE 0.9 % IV SOLN
Freq: Once | INTRAVENOUS | Status: AC
  Administered 2012-12-04: 20 mL/h via INTRAVENOUS

## 2012-12-04 NOTE — ED Notes (Signed)
Pt jumped over dog gate and hit her head,  No loc ,  Right arm numb and 'feels like dislocation"    Pt denies loc,

## 2012-12-04 NOTE — ED Provider Notes (Signed)
History     CSN: [id number]  Arrival date & time 12/04/12  2144   First MD Initiated Contact with Patient 12/04/12 2256      Chief Complaint  Patient presents with  . Fall  . Shoulder Injury    (Consider location/radiation/quality/duration/timing/severity/associated sxs/prior treatment) HPI Patient tripped over a gait in her home and injured her right shoulder 9 PM tonight she complains of right shoulder pain she denies any other complaint denies loss of consciousness denies neck pain pain is worse with attempting to move her shoulder she has some numbness in her right hand. No other associated symptoms. Felt well prior to the event. Last ate 6:30 PM tonight. Ambulatory since the event. Pain is severe worse with attempting to move her shoulder not improved by anything. Past Medical History  Diagnosis Date  . Chest pain   . HTN (hypertension)   . Hypercholesterolemia   . Normal coronary arteries     by heart Cath  . MVP (mitral valve prolapse)     Hx of     Past Surgical History  Procedure Laterality Date  . Cardiac catheterization      which revealed smooth and normal coronary arteries  . Lasik    . Tonsillectomy    . Tubal ligation      No family history on file.  History  Substance Use Topics  . Smoking status: Former Games developer  . Smokeless tobacco: Not on file     Comment: Remote Hx  . Alcohol Use: Not on file   no tobacco no alcohol no drugs  OB History   Grav Para Term Preterm Abortions TAB SAB Ect Mult Living                  Review of Systems  Constitutional: Negative.   HENT: Negative.   Respiratory: Negative.   Cardiovascular: Negative.   Gastrointestinal: Negative.   Musculoskeletal: Positive for arthralgias.       Pain in right shoulder otherwise negative  Skin: Negative.   Neurological: Positive for numbness.       Numbness right hand  Psychiatric/Behavioral: Negative.   All other systems reviewed and are negative.    Allergies  Codeine;  Crestor; Lipitor; Penicillins; and Sulfa antibiotics  Home Medications   Current Outpatient Rx  Name  Route  Sig  Dispense  Refill  . aspirin 81 MG tablet   Oral   Take 81 mg by mouth at bedtime.          . Calcium Carbonate-Vitamin D (CALCIUM + D PO)   Oral   Take by mouth daily.         . clidinium-chlordiazePOXIDE (LIBRAX) 2.5-5 MG per capsule   Oral   Take 1 capsule by mouth every morning.         . clonazePAM (KLONOPIN) 0.5 MG tablet   Oral   Take 0.25 mg by mouth daily as needed for anxiety.         . cycloSPORINE (RESTASIS) 0.05 % ophthalmic emulsion   Both Eyes   Place 1 drop into both eyes every 12 (twelve) hours.          Marland Kitchen levothyroxine (SYNTHROID, LEVOTHROID) 88 MCG tablet   Oral   Take 88 mcg by mouth daily.         . Multiple Vitamins-Minerals (PRESERVISION AREDS PO)   Oral   Take 1 tablet by mouth 2 (two) times daily.          Marland Kitchen  naproxen sodium (ANAPROX) 220 MG tablet   Oral   Take 220 mg by mouth daily as needed (pain/inflammation.).           BP 161/76  Pulse 70  Temp(Src) 98.6 F (37 C) (Oral)  Resp 22  SpO2 100%  Physical Exam  Nursing note and vitals reviewed. Constitutional: She is oriented to person, place, and time. She appears well-developed and well-nourished. She appears distressed.  Appears uncomfortable Glasgow Coma Score 15  HENT:  Head: Normocephalic and atraumatic.  Eyes: Conjunctivae are normal. Pupils are equal, round, and reactive to light.  Neck: Neck supple. No tracheal deviation present. No thyromegaly present.  Cardiovascular: Normal rate and regular rhythm.   No murmur heard. Pulmonary/Chest: Effort normal and breath sounds normal.  Abdominal: Soft. Bowel sounds are normal. She exhibits no distension. There is no tenderness.  Musculoskeletal: She exhibits no edema and no tenderness.  Right upper extremity deformities shoulder, skin intact. Limited range of motion of shoulder. Grip strength mildly weak  in right hand. Radial pulse 2+, good capillary refill. Good sensation over shoulder emblem area All other extremities no contusion abrasion or tenderness neurovascularly intact  Neurological: She is alert and oriented to person, place, and time. No cranial nerve deficit. Coordination normal.  Gait normal  Skin: Skin is warm and dry. No rash noted.  Psychiatric: She has a normal mood and affect.    ED Course  Procedures (including critical care time)  Labs Reviewed - No data to display No results found.   No diagnosis found. 12:07 AM the right shoulder was reduced by me. Procedure timeout was performed. N.p.o. status verified. Patient last ate 6:30 PM tonight. ASA category 2 Patient was premedicated with 8 mg of etomidate IV, with good relaxation. Suction available at bedside. Continuous pulse oximetry performed. Cardiac monitoring performed continuously. The right shoulder was reduced by me with traction and scapular rotation. She was placed in a shoulder immobilizer immediately. At 12:40 AM patient is alert Glasgow Coma Score 15, pain free. She still complains of some numbness in her hand the grip strength is now 5 over 5 overall radial pulse 2+ she has good sensation over shoulder emblem area X-rays reviewed by me  MDM  And shoulder immobilizer followup with Dr.Voytek (patient request.) Diagnosis dislocation of right shoulder       Doug Sou, MD 12/05/12 0111

## 2012-12-05 ENCOUNTER — Emergency Department (HOSPITAL_COMMUNITY): Payer: Medicare Other

## 2012-12-05 DIAGNOSIS — S43006A Unspecified dislocation of unspecified shoulder joint, initial encounter: Secondary | ICD-10-CM | POA: Diagnosis not present

## 2012-12-05 DIAGNOSIS — S43016A Anterior dislocation of unspecified humerus, initial encounter: Secondary | ICD-10-CM | POA: Diagnosis not present

## 2012-12-05 NOTE — ED Notes (Signed)
Pt ambulating in hall.

## 2012-12-05 NOTE — ED Notes (Signed)
XR at bedside

## 2012-12-14 DIAGNOSIS — M25819 Other specified joint disorders, unspecified shoulder: Secondary | ICD-10-CM | POA: Diagnosis not present

## 2012-12-14 DIAGNOSIS — S43006A Unspecified dislocation of unspecified shoulder joint, initial encounter: Secondary | ICD-10-CM | POA: Diagnosis not present

## 2012-12-14 DIAGNOSIS — M67919 Unspecified disorder of synovium and tendon, unspecified shoulder: Secondary | ICD-10-CM | POA: Diagnosis not present

## 2012-12-14 DIAGNOSIS — M25519 Pain in unspecified shoulder: Secondary | ICD-10-CM | POA: Diagnosis not present

## 2012-12-18 DIAGNOSIS — S43006A Unspecified dislocation of unspecified shoulder joint, initial encounter: Secondary | ICD-10-CM | POA: Diagnosis not present

## 2012-12-18 DIAGNOSIS — M25819 Other specified joint disorders, unspecified shoulder: Secondary | ICD-10-CM | POA: Diagnosis not present

## 2012-12-18 DIAGNOSIS — M719 Bursopathy, unspecified: Secondary | ICD-10-CM | POA: Diagnosis not present

## 2012-12-18 DIAGNOSIS — M25519 Pain in unspecified shoulder: Secondary | ICD-10-CM | POA: Diagnosis not present

## 2012-12-18 DIAGNOSIS — M67919 Unspecified disorder of synovium and tendon, unspecified shoulder: Secondary | ICD-10-CM | POA: Diagnosis not present

## 2012-12-22 DIAGNOSIS — M25519 Pain in unspecified shoulder: Secondary | ICD-10-CM | POA: Diagnosis not present

## 2012-12-22 DIAGNOSIS — S43006A Unspecified dislocation of unspecified shoulder joint, initial encounter: Secondary | ICD-10-CM | POA: Diagnosis not present

## 2012-12-22 DIAGNOSIS — M719 Bursopathy, unspecified: Secondary | ICD-10-CM | POA: Diagnosis not present

## 2012-12-22 DIAGNOSIS — M67919 Unspecified disorder of synovium and tendon, unspecified shoulder: Secondary | ICD-10-CM | POA: Diagnosis not present

## 2012-12-22 DIAGNOSIS — M25819 Other specified joint disorders, unspecified shoulder: Secondary | ICD-10-CM | POA: Diagnosis not present

## 2012-12-25 DIAGNOSIS — L821 Other seborrheic keratosis: Secondary | ICD-10-CM | POA: Diagnosis not present

## 2012-12-25 DIAGNOSIS — M25519 Pain in unspecified shoulder: Secondary | ICD-10-CM | POA: Diagnosis not present

## 2012-12-25 DIAGNOSIS — M67919 Unspecified disorder of synovium and tendon, unspecified shoulder: Secondary | ICD-10-CM | POA: Diagnosis not present

## 2012-12-25 DIAGNOSIS — M25819 Other specified joint disorders, unspecified shoulder: Secondary | ICD-10-CM | POA: Diagnosis not present

## 2012-12-25 DIAGNOSIS — Z85828 Personal history of other malignant neoplasm of skin: Secondary | ICD-10-CM | POA: Diagnosis not present

## 2012-12-25 DIAGNOSIS — I789 Disease of capillaries, unspecified: Secondary | ICD-10-CM | POA: Diagnosis not present

## 2012-12-25 DIAGNOSIS — L570 Actinic keratosis: Secondary | ICD-10-CM | POA: Diagnosis not present

## 2012-12-25 DIAGNOSIS — L819 Disorder of pigmentation, unspecified: Secondary | ICD-10-CM | POA: Diagnosis not present

## 2012-12-25 DIAGNOSIS — S43006A Unspecified dislocation of unspecified shoulder joint, initial encounter: Secondary | ICD-10-CM | POA: Diagnosis not present

## 2012-12-26 DIAGNOSIS — S43429A Sprain of unspecified rotator cuff capsule, initial encounter: Secondary | ICD-10-CM | POA: Diagnosis not present

## 2012-12-26 DIAGNOSIS — S43006A Unspecified dislocation of unspecified shoulder joint, initial encounter: Secondary | ICD-10-CM | POA: Diagnosis not present

## 2013-01-01 DIAGNOSIS — M19019 Primary osteoarthritis, unspecified shoulder: Secondary | ICD-10-CM | POA: Diagnosis not present

## 2013-01-02 DIAGNOSIS — M19019 Primary osteoarthritis, unspecified shoulder: Secondary | ICD-10-CM | POA: Diagnosis not present

## 2013-01-04 ENCOUNTER — Encounter (HOSPITAL_BASED_OUTPATIENT_CLINIC_OR_DEPARTMENT_OTHER): Payer: Self-pay | Admitting: *Deleted

## 2013-01-04 ENCOUNTER — Other Ambulatory Visit: Payer: Self-pay | Admitting: Orthopedic Surgery

## 2013-01-04 DIAGNOSIS — S43429A Sprain of unspecified rotator cuff capsule, initial encounter: Secondary | ICD-10-CM | POA: Diagnosis not present

## 2013-01-04 DIAGNOSIS — S43006A Unspecified dislocation of unspecified shoulder joint, initial encounter: Secondary | ICD-10-CM | POA: Diagnosis not present

## 2013-01-04 NOTE — Progress Notes (Signed)
Pt [facility]-fell 3/14 injured shoulder-had hx chest pressure worked up 2008-even had cardiac cath-clean No HTN meds Anxiety To bring all meds and overnight bag-was told she would stay

## 2013-01-05 ENCOUNTER — Encounter (HOSPITAL_BASED_OUTPATIENT_CLINIC_OR_DEPARTMENT_OTHER)
Admission: RE | Disposition: A | Discharge status: Home / Self Care | Payer: Self-pay | Source: Ambulatory Visit | Attending: Orthopedic Surgery

## 2013-01-05 ENCOUNTER — Encounter (HOSPITAL_BASED_OUTPATIENT_CLINIC_OR_DEPARTMENT_OTHER): Payer: Self-pay | Admitting: Certified Registered Nurse Anesthetist

## 2013-01-05 ENCOUNTER — Ambulatory Visit (HOSPITAL_BASED_OUTPATIENT_CLINIC_OR_DEPARTMENT_OTHER): Payer: Medicare Other | Admitting: Certified Registered Nurse Anesthetist

## 2013-01-05 ENCOUNTER — Ambulatory Visit (HOSPITAL_BASED_OUTPATIENT_CLINIC_OR_DEPARTMENT_OTHER)
Admission: RE | Admit: 2013-01-05 | Discharge: 2013-01-05 | Disposition: A | Discharge status: Home / Self Care | Payer: Medicare Other | Source: Ambulatory Visit | Attending: Orthopedic Surgery | Admitting: Orthopedic Surgery

## 2013-01-05 DIAGNOSIS — E7800 Pure hypercholesterolemia, unspecified: Secondary | ICD-10-CM | POA: Insufficient documentation

## 2013-01-05 DIAGNOSIS — S43429A Sprain of unspecified rotator cuff capsule, initial encounter: Secondary | ICD-10-CM | POA: Insufficient documentation

## 2013-01-05 DIAGNOSIS — Z87891 Personal history of nicotine dependence: Secondary | ICD-10-CM | POA: Insufficient documentation

## 2013-01-05 DIAGNOSIS — G540 Brachial plexus disorders: Secondary | ICD-10-CM

## 2013-01-05 DIAGNOSIS — M129 Arthropathy, unspecified: Secondary | ICD-10-CM | POA: Insufficient documentation

## 2013-01-05 DIAGNOSIS — E039 Hypothyroidism, unspecified: Secondary | ICD-10-CM | POA: Insufficient documentation

## 2013-01-05 DIAGNOSIS — M75101 Unspecified rotator cuff tear or rupture of right shoulder, not specified as traumatic: Secondary | ICD-10-CM

## 2013-01-05 DIAGNOSIS — Z79899 Other long term (current) drug therapy: Secondary | ICD-10-CM | POA: Diagnosis not present

## 2013-01-05 DIAGNOSIS — K589 Irritable bowel syndrome without diarrhea: Secondary | ICD-10-CM | POA: Insufficient documentation

## 2013-01-05 DIAGNOSIS — F411 Generalized anxiety disorder: Secondary | ICD-10-CM | POA: Insufficient documentation

## 2013-01-05 DIAGNOSIS — Z880 Allergy status to penicillin: Secondary | ICD-10-CM | POA: Insufficient documentation

## 2013-01-05 DIAGNOSIS — Z885 Allergy status to narcotic agent status: Secondary | ICD-10-CM | POA: Insufficient documentation

## 2013-01-05 DIAGNOSIS — M7541 Impingement syndrome of right shoulder: Secondary | ICD-10-CM

## 2013-01-05 DIAGNOSIS — Z882 Allergy status to sulfonamides status: Secondary | ICD-10-CM | POA: Diagnosis not present

## 2013-01-05 DIAGNOSIS — Z7982 Long term (current) use of aspirin: Secondary | ICD-10-CM | POA: Insufficient documentation

## 2013-01-05 DIAGNOSIS — X58XXXA Exposure to other specified factors, initial encounter: Secondary | ICD-10-CM | POA: Insufficient documentation

## 2013-01-05 DIAGNOSIS — M7520 Bicipital tendinitis, unspecified shoulder: Secondary | ICD-10-CM

## 2013-01-05 DIAGNOSIS — I10 Essential (primary) hypertension: Secondary | ICD-10-CM | POA: Insufficient documentation

## 2013-01-05 DIAGNOSIS — Z888 Allergy status to other drugs, medicaments and biological substances status: Secondary | ICD-10-CM | POA: Diagnosis not present

## 2013-01-05 DIAGNOSIS — M25819 Other specified joint disorders, unspecified shoulder: Secondary | ICD-10-CM | POA: Diagnosis not present

## 2013-01-05 DIAGNOSIS — M75120 Complete rotator cuff tear or rupture of unspecified shoulder, not specified as traumatic: Secondary | ICD-10-CM | POA: Diagnosis not present

## 2013-01-05 HISTORY — DX: Brachial plexus disorders: G54.0

## 2013-01-05 HISTORY — DX: Bicipital tendinitis, unspecified shoulder: M75.20

## 2013-01-05 HISTORY — DX: Unspecified rotator cuff tear or rupture of right shoulder, not specified as traumatic: M75.101

## 2013-01-05 HISTORY — DX: Anxiety disorder, unspecified: F41.9

## 2013-01-05 HISTORY — DX: Other seasonal allergic rhinitis: J30.2

## 2013-01-05 HISTORY — PX: SHOULDER ARTHROSCOPY WITH ROTATOR CUFF REPAIR: SHX5685

## 2013-01-05 HISTORY — DX: Impingement syndrome of right shoulder: M75.41

## 2013-01-05 HISTORY — DX: Irritable bowel syndrome, unspecified: K58.9

## 2013-01-05 HISTORY — DX: Hypothyroidism, unspecified: E03.9

## 2013-01-05 HISTORY — DX: Unspecified osteoarthritis, unspecified site: M19.90

## 2013-01-05 LAB — POCT HEMOGLOBIN-HEMACUE: Hemoglobin: 11.9 g/dL — ABNORMAL LOW (ref 12.0–15.0)

## 2013-01-05 SURGERY — ARTHROSCOPY, SHOULDER, WITH ROTATOR CUFF REPAIR
Anesthesia: General | Laterality: Right | Wound class: Clean

## 2013-01-05 MED ORDER — HYDROMORPHONE HCL PF 1 MG/ML IJ SOLN
0.2500 mg | INTRAMUSCULAR | Status: DC | PRN
  Administered 2013-01-05: 0.5 mg via INTRAVENOUS
  Administered 2013-01-05: 0.25 mg via INTRAVENOUS
  Administered 2013-01-05: 0.5 mg via INTRAVENOUS

## 2013-01-05 MED ORDER — SUCCINYLCHOLINE CHLORIDE 20 MG/ML IJ SOLN
INTRAMUSCULAR | Status: DC | PRN
  Administered 2013-01-05: 100 mg via INTRAVENOUS

## 2013-01-05 MED ORDER — LIDOCAINE HCL (CARDIAC) 20 MG/ML IV SOLN
INTRAVENOUS | Status: DC | PRN
  Administered 2013-01-05: 60 mg via INTRAVENOUS

## 2013-01-05 MED ORDER — SENNA-DOCUSATE SODIUM 8.6-50 MG PO TABS: 1.0000 | ORAL_TABLET | Freq: Every day | ORAL | Status: DC

## 2013-01-05 MED ORDER — OXYCODONE-ACETAMINOPHEN 5-325 MG PO TABS: 1.0000 | ORAL_TABLET | Freq: Four times a day (QID) | ORAL | Status: DC | PRN

## 2013-01-05 MED ORDER — SODIUM CHLORIDE 0.9 % IR SOLN
Status: DC | PRN
  Administered 2013-01-05: 21000 mL

## 2013-01-05 MED ORDER — VANCOMYCIN HCL IN DEXTROSE 1-5 GM/200ML-% IV SOLN
1000.0000 mg | INTRAVENOUS | Status: AC
  Administered 2013-01-05: 1000 mg via INTRAVENOUS

## 2013-01-05 MED ORDER — ONDANSETRON HCL 4 MG/2ML IJ SOLN: 4.0000 mg | Freq: Once | INTRAMUSCULAR | Status: DC | PRN

## 2013-01-05 MED ORDER — BUPIVACAINE HCL (PF) 0.5 % IJ SOLN
INTRAMUSCULAR | Status: DC | PRN
  Administered 2013-01-05: 20 mL

## 2013-01-05 MED ORDER — ONDANSETRON HCL 4 MG PO TABS: 4.0000 mg | ORAL_TABLET | Freq: Three times a day (TID) | ORAL | Status: DC | PRN

## 2013-01-05 MED ORDER — PROPOFOL 10 MG/ML IV BOLUS
INTRAVENOUS | Status: DC | PRN
  Administered 2013-01-05: 150 mg via INTRAVENOUS

## 2013-01-05 MED ORDER — FENTANYL CITRATE 0.05 MG/ML IJ SOLN
INTRAMUSCULAR | Status: DC | PRN
  Administered 2013-01-05 (×2): 50 ug via INTRAVENOUS

## 2013-01-05 MED ORDER — DEXAMETHASONE SODIUM PHOSPHATE 4 MG/ML IJ SOLN
INTRAMUSCULAR | Status: DC | PRN
  Administered 2013-01-05: 4 mg via INTRAVENOUS

## 2013-01-05 MED ORDER — OXYCODONE HCL 5 MG PO TABS
5.0000 mg | ORAL_TABLET | Freq: Once | ORAL | Status: AC | PRN
  Administered 2013-01-05: 5 mg via ORAL

## 2013-01-05 MED ORDER — MEPERIDINE HCL 25 MG/ML IJ SOLN: 6.2500 mg | INTRAMUSCULAR | Status: DC | PRN

## 2013-01-05 MED ORDER — OXYCODONE HCL 5 MG/5ML PO SOLN: 5.0000 mg | Freq: Once | ORAL | Status: AC | PRN

## 2013-01-05 MED ORDER — LACTATED RINGERS IV SOLN
INTRAVENOUS | Status: DC
  Administered 2013-01-05: 09:00:00 via INTRAVENOUS

## 2013-01-05 SURGICAL SUPPLY — 67 items
ANCHOR SUT BIO SW 4.75X19.1 (Anchor) ×4 IMPLANT
BENZOIN TINCTURE PRP APPL 2/3 (GAUZE/BANDAGES/DRESSINGS) ×2 IMPLANT
BLADE CUTTER GATOR 3.5 (BLADE) ×2 IMPLANT
BLADE GREAT WHITE 4.2 (BLADE) IMPLANT
BLADE SURG 15 STRL LF DISP TIS (BLADE) IMPLANT
BLADE SURG 15 STRL SS (BLADE)
BUR OVAL 4.0 (BURR) IMPLANT
BUR OVAL 6.0 (BURR) ×2 IMPLANT
CANISTER OMNI JUG 16 LITER (MISCELLANEOUS) IMPLANT
CANISTER SUCTION 2500CC (MISCELLANEOUS) ×6 IMPLANT
CANNULA 5.75X71 LONG (CANNULA) ×2 IMPLANT
CANNULA TWIST IN 8.25X7CM (CANNULA) ×2 IMPLANT
CLOTH BEACON ORANGE TIMEOUT ST (SAFETY) ×2 IMPLANT
DECANTER SPIKE VIAL GLASS SM (MISCELLANEOUS) IMPLANT
DRAPE INCISE IOBAN 66X45 STRL (DRAPES) ×2 IMPLANT
DRAPE SHOULDER BEACH CHAIR (DRAPES) ×2 IMPLANT
DRAPE U 20/CS (DRAPES) ×2 IMPLANT
DRAPE U-SHAPE 47X51 STRL (DRAPES) ×2 IMPLANT
DRSG PAD ABDOMINAL 8X10 ST (GAUZE/BANDAGES/DRESSINGS) ×2 IMPLANT
DURAPREP 26ML APPLICATOR (WOUND CARE) ×2 IMPLANT
ELECT REM PT RETURN 9FT ADLT (ELECTROSURGICAL)
ELECTRODE REM PT RTRN 9FT ADLT (ELECTROSURGICAL) IMPLANT
FIBERSTICK 2 (SUTURE) IMPLANT
GLOVE BIO SURGEON STRL SZ 6.5 (GLOVE) ×2 IMPLANT
GLOVE BIO SURGEON STRL SZ8 (GLOVE) ×2 IMPLANT
GLOVE BIOGEL PI IND STRL 7.0 (GLOVE) ×1 IMPLANT
GLOVE BIOGEL PI IND STRL 8 (GLOVE) ×2 IMPLANT
GLOVE BIOGEL PI INDICATOR 7.0 (GLOVE) ×1
GLOVE BIOGEL PI INDICATOR 8 (GLOVE) ×2
GLOVE EXAM NITRILE PF MED BLUE (GLOVE) ×2 IMPLANT
GLOVE ORTHO TXT STRL SZ7.5 (GLOVE) ×2 IMPLANT
GOWN BRE IMP PREV XXLGXLNG (GOWN DISPOSABLE) ×4 IMPLANT
IMMOBILIZER SHOULDER XLGE (ORTHOPEDIC SUPPLIES) IMPLANT
IV NS IRRIG 3000ML ARTHROMATIC (IV SOLUTION) ×14 IMPLANT
KIT SHOULDER TRACTION (DRAPES) ×2 IMPLANT
NEEDLE SCORPION MULTI FIRE (NEEDLE) ×2 IMPLANT
PACK ARTHROSCOPY DSU (CUSTOM PROCEDURE TRAY) ×2 IMPLANT
PACK BASIN DAY SURGERY FS (CUSTOM PROCEDURE TRAY) ×2 IMPLANT
SET ARTHROSCOPY TUBING (MISCELLANEOUS) ×1
SET ARTHROSCOPY TUBING LN (MISCELLANEOUS) ×1 IMPLANT
SHEET MEDIUM DRAPE 40X70 STRL (DRAPES) ×2 IMPLANT
SLEEVE SCD COMPRESS KNEE MED (MISCELLANEOUS) ×2 IMPLANT
SLING ARM FOAM STRAP LRG (SOFTGOODS) IMPLANT
SLING ARM FOAM STRAP MED (SOFTGOODS) IMPLANT
SLING ARM FOAM STRAP XLG (SOFTGOODS) IMPLANT
SLING ARM IMMOBILIZER LRG (SOFTGOODS) ×2 IMPLANT
SLING ARM IMMOBILIZER MED (SOFTGOODS) IMPLANT
SPONGE GAUZE 4X4 12PLY (GAUZE/BANDAGES/DRESSINGS) ×2 IMPLANT
STRIP CLOSURE SKIN 1/2X4 (GAUZE/BANDAGES/DRESSINGS) ×2 IMPLANT
SUT FIBERWIRE #2 38 T-5 BLUE (SUTURE)
SUT LASSO 45 DEGREE (SUTURE) IMPLANT
SUT LASSO 45 DEGREE LEFT (SUTURE) IMPLANT
SUT LASSO 45D RIGHT (SUTURE) IMPLANT
SUT MNCRL AB 4-0 PS2 18 (SUTURE) IMPLANT
SUT PDS AB 0 CT 36 (SUTURE) ×2 IMPLANT
SUT PDS AB 1 CT  36 (SUTURE)
SUT PDS AB 1 CT 36 (SUTURE) IMPLANT
SUT TIGER TAPE 7 IN WHITE (SUTURE) ×2 IMPLANT
SUT VIC AB 3-0 SH 27 (SUTURE)
SUT VIC AB 3-0 SH 27X BRD (SUTURE) IMPLANT
SUTURE FIBERWR #2 38 T-5 BLUE (SUTURE) IMPLANT
TAPE FIBER 2MM 7IN #2 BLUE (SUTURE) ×2 IMPLANT
TOWEL OR 17X24 6PK STRL BLUE (TOWEL DISPOSABLE) ×2 IMPLANT
TOWEL OR NON WOVEN STRL DISP B (DISPOSABLE) ×2 IMPLANT
TUBE CONNECTING 20X1/4 (TUBING) ×2 IMPLANT
WAND STAR VAC 90 (SURGICAL WAND) ×2 IMPLANT
WATER STERILE IRR 1000ML POUR (IV SOLUTION) ×2 IMPLANT

## 2013-01-05 NOTE — Anesthesia Postprocedure Evaluation (Signed)
Anesthesia Post Note  Patient: Bianca Garcia  Procedure(s) Performed: Procedure(s) (LRB): SHOULDER ARTHROSCOPY WITH ARTHROSCOPIC  ROTATOR CUFF REPAIR, ACROMIOPLASTY, EXTENSIVE DEBRIDEMENT (Right)  Anesthesia type: General  Patient location: PACU  Post pain: Pain level controlled and Adequate analgesia  Post assessment: Post-op Vital signs reviewed, Patient's Cardiovascular Status Stable, Respiratory Function Stable, Patent Airway and Pain level controlled  Last Vitals:  Filed Vitals:   01/05/13 1315  BP: 144/82  Pulse: 77  Temp:   Resp: 14    Post vital signs: Reviewed and stable  Level of consciousness: awake, alert  and oriented  Complications: No apparent anesthesia complications

## 2013-01-05 NOTE — H&P (Signed)
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 female who presents for preoperative history and physical with a diagnosis of right shoulder massive rotator cuff tear after a dislocation. Symptoms are rated as moderate to severe, and have been worsening.  This is significantly impairing activities of daily living.  She has elected for surgical management. Prior to her dislocation she was able to lift her arm, and do just about everything although she did get some pain from time to time, and had seen physicians for shoulder problems. Nonetheless her function was reasonably good. Since her dislocation she has not had the ability to lift her arm at all. She also had a period of time when her hand is completely non-, and she lost function of her fingers. This has returned, but she still has burning dysesthesias throughout her upper extremity.  Past Medical History  Diagnosis Date  . Chest pain   . Hypercholesterolemia   . Normal coronary arteries     by heart Cath  . MVP (mitral valve prolapse)     Hx of   . HTN (hypertension)     no meds  . Seasonal allergies   . IBS (irritable bowel syndrome)   . Anxiety   . Hypothyroidism   . Arthritis    Past Surgical History  Procedure Laterality Date  . Lasik    . Tonsillectomy    . Tubal ligation    . Cardiac catheterization  2004    which revealed smooth and normal coronary arteries  . Tonsillectomy    . Cosmetic surgery  2010    eyes-facial  . Carpal tunnel release  2000    lt  . Colonoscopy    . Dental surgery  1960  . Shoulder adhesion release  3/14    sedated for dislocation shoulder-   History   Social History  . Marital Status: Single    Spouse Name: N/A    Number of Children: N/A  . Years of Education: N/A   Social History Main Topics  . Smoking status: Former Smoker    Quit date: 01/05/1960  . Smokeless  tobacco: None     Comment: Remote Hx  . Alcohol Use: Yes     Comment: occ  . Drug Use: None  . Sexually Active: None   Other Topics Concern  . None   Social History Narrative  . None   History reviewed. No pertinent family history. Allergies  Allergen Reactions  . Codeine   . Crestor (Rosuvastatin Calcium)     Intolerant   . Lipitor (Atorvastatin Calcium)     Intolerant   . Penicillins   . Sulfa Antibiotics     vomiting   Prior to Admission medications   Medication Sig Start Date End Date Taking? Authorizing Provider  cetirizine (ZYRTEC) 10 MG tablet Take 10 mg by mouth as needed for allergies.   Yes Historical Provider, MD  aspirin 81 MG tablet Take 81 mg by mouth at bedtime.     Historical Provider, MD  Calcium Carbonate-Vitamin D (CALCIUM + D PO) Take by mouth daily.    Historical Provider, MD  clidinium-chlordiazePOXIDE (LIBRAX) 2.5-5 MG per capsule Take 1 capsule by mouth every morning.    Historical Provider, MD  clonazePAM (KLONOPIN) 0.5 MG tablet Take 0.25 mg by mouth daily as needed for anxiety.    Historical Provider, MD  cycloSPORINE (RESTASIS) 0.05 %  ophthalmic emulsion Place 1 drop into both eyes every 12 (twelve) hours.     Historical Provider, MD  levothyroxine (SYNTHROID, LEVOTHROID) 88 MCG tablet Take 88 mcg by mouth daily.    Historical Provider, MD  Multiple Vitamins-Minerals (PRESERVISION AREDS PO) Take 1 tablet by mouth 2 (two) times daily.     Historical Provider, MD  naproxen sodium (ANAPROX) 220 MG tablet Take 220 mg by mouth daily as needed (pain/inflammation.).    Historical Provider, MD     Positive ROS: All other systems have been reviewed and were otherwise negative with the exception of those mentioned in the HPI and as above.  Physical Exam: General: Alert, no acute distress Cardiovascular: No pedal edema Respiratory: No cyanosis, no use of accessory musculature GI: No organomegaly, abdomen is soft and non-tender Skin: No lesions in the  area of chief complaint Neurologic: Sensation intact distally Psychiatric: Patient is competent for consent with normal mood and affect Lymphatic: No axillary or cervical lymphadenopathy  MUSCULOSKELETAL: Right shoulder active forward flexion is 0-10. She has profound weakness with deltoid as well as rotator cuff testing. All fingers do flex extend and abduct at this point, although she describes paresthesias in the ulnar nerve distribution.  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 We've also discussed the risks for permanent nerve palsy, loss of function, inability to regain lifting capacity, the potential for revision surgery, recurrent rotator cuff tear, among others.  Nishika Parkhurst P, MD Cell [phone] Pager [phone]  01/05/2013 7:25 AM

## 2013-01-05 NOTE — Transfer of Care (Signed)
Immediate Anesthesia Transfer of Care Note  Patient: Bianca Garcia  Procedure(s) Performed: Procedure(s): SHOULDER ARTHROSCOPY WITH ARTHROSCOPIC  ROTATOR CUFF REPAIR, ACROMIOPLASTY, EXTENSIVE DEBRIDEMENT (Right)  Patient Location: PACU  Anesthesia Type:General  Level of Consciousness: sedated  Airway & Oxygen Therapy: Patient Spontanous Breathing and Patient connected to face mask oxygen  Post-op Assessment: Report given to PACU RN and Post -op Vital signs reviewed and stable  Post vital signs: Reviewed and stable  Complications: No apparent anesthesia complications

## 2013-01-05 NOTE — Anesthesia Preprocedure Evaluation (Signed)
Anesthesia Evaluation  Patient identified by MRN, date of birth, ID band Patient awake    Reviewed: Allergy & Precautions, H&P , NPO status , Patient's Chart, lab work & pertinent test results  Airway Mallampati: I TM Distance: >3 FB Neck ROM: Full    Dental   Pulmonary          Cardiovascular hypertension, Pt. on medications     Neuro/Psych    GI/Hepatic   Endo/Other  Hypothyroidism   Renal/GU      Musculoskeletal   Abdominal   Peds  Hematology   Anesthesia Other Findings   Reproductive/Obstetrics                           Anesthesia Physical Anesthesia Plan  ASA: II  Anesthesia Plan: General   Post-op Pain Management:    Induction: Intravenous  Airway Management Planned: Oral ETT  Additional Equipment:   Intra-op Plan:   Post-operative Plan: Extubation in OR  Informed Consent: I have reviewed the patients History and Physical, chart, labs and discussed the procedure including the risks, benefits and alternatives for the proposed anesthesia with the patient or authorized representative who has indicated his/her understanding and acceptance.     Plan Discussed with: CRNA and Surgeon  Anesthesia Plan Comments: (Pt has nerve injury from dislocation with current deficit. Plan GA only and intravenous analgesia. Arta Bruce MD)        Anesthesia Quick Evaluation

## 2013-01-05 NOTE — Op Note (Signed)
01/05/2013  12:48 PM  PATIENT:  Bianca Garcia    PRE-OPERATIVE DIAGNOSIS:  Right shoulder impingement and rotator cuff tear  POST-OPERATIVE DIAGNOSIS:  Same  PROCEDURE:  SHOULDER ARTHROSCOPY WITH ARTHROSCOPIC  ROTATOR CUFF REPAIR, ACROMIOPLASTY, EXTENSIVE DEBRIDEMENT with biceps tenolysis  SURGEON:  Eulas Post, MD  PHYSICIAN ASSISTANT: Janace Litten, OPA-C, present and scrubbed throughout the case, critical for completion in a timely fashion, and for retraction, instrumentation, and closure.  ANESTHESIA:   General  PREOPERATIVE INDICATIONS:  Bianca Garcia is a  72 y.o. female who had a shoulder dislocation with massive rotator cuff tear one month ago. This is associated with brachial plexus palsy. Her brachial plexus has improved slightly, and she can now use her hand, but still has not had any function in her shoulder, with also concomitant loss of sensation over the deltoid. Preoperative MRI demonstrated evidence for massive rotator cuff tear. There was also some atrophy. She elected for surgical intervention. This may have been an acute on chronic massive rotator cuff tear with dislocation.  The risks benefits and alternatives were discussed with the patient preoperatively including but not limited to the risks of infection, bleeding, nerve injury, cardiopulmonary complications, the need for revision surgery, among others, and the patient was willing to proceed. We also discussed the risks for recurrent dislocation, incomplete relief of neurologic function, incomplete healing of the rotator cuff tear, with the possibility of recurrent tear, as well as the progression to rotator cuff arthropathy, inability to regain function, among others.  OPERATIVE IMPLANTS: 4.75 mm swivel lock x2 with a total of 2 inverted fiber tape, one for the infraspinatus and 1 for the supraspinatus.  OPERATIVE FINDINGS: There was a massive 2 tendon rotator cuff tear with retraction back to the level of  the glenoid. The biceps tendon also was extremely damaged, with greater than 50% tearing. The glenohumeral articular surface was in reasonably good condition. The overall tissue quality was reasonably good. There was evidence for acute rupture of the tendons, with hemorrhage at the bed of the normal insertion for both the infraspinatus primarily, and to a lesser degree the supraspinatus. It may be that some of this tendon was torn chronically, it did appear that the infraspinatus at least the posterior aspect of the supraspinatus was achieved. She had moderate subacromial spurring, and for that I did perform an acromioplasty.  OPERATIVE PROCEDURE: The patient is brought to the operating room and placed in the supine position. General anesthesia was administered. She had full motion of the shoulder. Time out was performed. The right upper extremity was prepped and draped in usual sterile fashion. She was in a semilateral decubitus position with all bony prominences padded. I only used 5 pounds of traction in order to minimize additional injury to her brachial plexus.  Diagnostic arthroscopy was carried out the above-named findings. I used the arthroscopic biter to release the biceps tendon after debriding the frayed portions of it, and finding that there was not much tendon left. I debrided the stump of the remaining biceps, and it actually did not withdraw out of the joint that significantly. I also debrided the labrum. The labrum circumferentially was still reasonably well attached. The infraspinatus and supraspinatus were torn. I debrided the stump of the supraspinatus tendon, back to a good quality tendon. I performed a bursectomy, and mobilized the tissue. The quality was reasonably good and his mobility was still good. I then used a bird beak suture passer to pass a horizontal mattress stitch using  fiber tape, these were placed in the back. I then use the scorpion suture passer to place another horizontal  mattress suture anteriorly. I did perform a light acromioplasty with a bur, and also a tubercle plasty to prepare the bony bed for healing.   I minimized the release of the CA ligament with the ArthroCare cautery, in order to minimize the risk for future instability and escape.  I used a 4.75 mm swivel lock for the infraspinatus, and then a second one for the supraspinatus. Excellent fixation was achieved. The tendon was reapposed to the tuberosity. The tuberosity itself was fairly small, and the anchors and it up somewhat close to each other, but still had excellent fixation.  The instruments were removed, the shoulder was drained and injected, she did not receive a block do to her pre-existing plexus injury, and the portals were closed with Monocryl followed by Steri-Strips and sterile gauze. She was awakened and returned to the PACU in stable and satisfactory condition. There were no complications.    I then went to the subacromial space, and

## 2013-01-05 NOTE — Anesthesia Procedure Notes (Signed)
Procedure Name: Intubation Date/Time: 01/05/2013 10:53 AM Performed by: Sukhraj Esquivias D Pre-anesthesia Checklist: Patient identified, Emergency Drugs available, Suction available and Patient being monitored Patient Re-evaluated:Patient Re-evaluated prior to inductionOxygen Delivery Method: Circle System Utilized Preoxygenation: Pre-oxygenation with 100% oxygen Intubation Type: IV induction Ventilation: Mask ventilation without difficulty Laryngoscope Size: Mac and 3 Grade View: Grade I Tube type: Oral Tube size: 7.0 mm Number of attempts: 1 Airway Equipment and Method: stylet and LTA kit utilized Placement Confirmation: ETT inserted through vocal cords under direct vision,  positive ETCO2 and breath sounds checked- equal and bilateral Secured at: 21 cm Tube secured with: Tape Dental Injury: Teeth and Oropharynx as per pre-operative assessment

## 2013-01-08 ENCOUNTER — Encounter (HOSPITAL_BASED_OUTPATIENT_CLINIC_OR_DEPARTMENT_OTHER): Payer: Self-pay | Admitting: Orthopedic Surgery

## 2013-01-17 DIAGNOSIS — Z4789 Encounter for other orthopedic aftercare: Secondary | ICD-10-CM | POA: Diagnosis not present

## 2013-01-26 DIAGNOSIS — M76899 Other specified enthesopathies of unspecified lower limb, excluding foot: Secondary | ICD-10-CM | POA: Diagnosis not present

## 2013-01-29 DIAGNOSIS — IMO0002 Reserved for concepts with insufficient information to code with codable children: Secondary | ICD-10-CM | POA: Diagnosis not present

## 2013-01-29 DIAGNOSIS — H2510 Age-related nuclear cataract, unspecified eye: Secondary | ICD-10-CM | POA: Diagnosis not present

## 2013-01-29 DIAGNOSIS — H04129 Dry eye syndrome of unspecified lacrimal gland: Secondary | ICD-10-CM | POA: Diagnosis not present

## 2013-02-06 DIAGNOSIS — H04129 Dry eye syndrome of unspecified lacrimal gland: Secondary | ICD-10-CM | POA: Diagnosis not present

## 2013-02-23 DIAGNOSIS — M25669 Stiffness of unspecified knee, not elsewhere classified: Secondary | ICD-10-CM | POA: Diagnosis not present

## 2013-02-23 DIAGNOSIS — M75120 Complete rotator cuff tear or rupture of unspecified shoulder, not specified as traumatic: Secondary | ICD-10-CM | POA: Diagnosis not present

## 2013-02-23 DIAGNOSIS — M6281 Muscle weakness (generalized): Secondary | ICD-10-CM | POA: Diagnosis not present

## 2013-02-26 DIAGNOSIS — M25669 Stiffness of unspecified knee, not elsewhere classified: Secondary | ICD-10-CM | POA: Diagnosis not present

## 2013-02-26 DIAGNOSIS — M75120 Complete rotator cuff tear or rupture of unspecified shoulder, not specified as traumatic: Secondary | ICD-10-CM | POA: Diagnosis not present

## 2013-02-26 DIAGNOSIS — M6281 Muscle weakness (generalized): Secondary | ICD-10-CM | POA: Diagnosis not present

## 2013-02-28 DIAGNOSIS — M25669 Stiffness of unspecified knee, not elsewhere classified: Secondary | ICD-10-CM | POA: Diagnosis not present

## 2013-02-28 DIAGNOSIS — M75120 Complete rotator cuff tear or rupture of unspecified shoulder, not specified as traumatic: Secondary | ICD-10-CM | POA: Diagnosis not present

## 2013-02-28 DIAGNOSIS — M25819 Other specified joint disorders, unspecified shoulder: Secondary | ICD-10-CM | POA: Diagnosis not present

## 2013-02-28 DIAGNOSIS — M6281 Muscle weakness (generalized): Secondary | ICD-10-CM | POA: Diagnosis not present

## 2013-03-07 DIAGNOSIS — M6281 Muscle weakness (generalized): Secondary | ICD-10-CM | POA: Diagnosis not present

## 2013-03-07 DIAGNOSIS — M25669 Stiffness of unspecified knee, not elsewhere classified: Secondary | ICD-10-CM | POA: Diagnosis not present

## 2013-03-07 DIAGNOSIS — M75120 Complete rotator cuff tear or rupture of unspecified shoulder, not specified as traumatic: Secondary | ICD-10-CM | POA: Diagnosis not present

## 2013-03-09 DIAGNOSIS — M25669 Stiffness of unspecified knee, not elsewhere classified: Secondary | ICD-10-CM | POA: Diagnosis not present

## 2013-03-09 DIAGNOSIS — M75120 Complete rotator cuff tear or rupture of unspecified shoulder, not specified as traumatic: Secondary | ICD-10-CM | POA: Diagnosis not present

## 2013-03-09 DIAGNOSIS — M6281 Muscle weakness (generalized): Secondary | ICD-10-CM | POA: Diagnosis not present

## 2013-03-09 DIAGNOSIS — M25819 Other specified joint disorders, unspecified shoulder: Secondary | ICD-10-CM | POA: Diagnosis not present

## 2013-03-12 DIAGNOSIS — H04129 Dry eye syndrome of unspecified lacrimal gland: Secondary | ICD-10-CM | POA: Diagnosis not present

## 2013-03-13 DIAGNOSIS — M6281 Muscle weakness (generalized): Secondary | ICD-10-CM | POA: Diagnosis not present

## 2013-03-13 DIAGNOSIS — M25669 Stiffness of unspecified knee, not elsewhere classified: Secondary | ICD-10-CM | POA: Diagnosis not present

## 2013-03-13 DIAGNOSIS — M75120 Complete rotator cuff tear or rupture of unspecified shoulder, not specified as traumatic: Secondary | ICD-10-CM | POA: Diagnosis not present

## 2013-03-13 DIAGNOSIS — M25819 Other specified joint disorders, unspecified shoulder: Secondary | ICD-10-CM | POA: Diagnosis not present

## 2013-03-15 DIAGNOSIS — M25819 Other specified joint disorders, unspecified shoulder: Secondary | ICD-10-CM | POA: Diagnosis not present

## 2013-03-15 DIAGNOSIS — M75120 Complete rotator cuff tear or rupture of unspecified shoulder, not specified as traumatic: Secondary | ICD-10-CM | POA: Diagnosis not present

## 2013-03-15 DIAGNOSIS — M6281 Muscle weakness (generalized): Secondary | ICD-10-CM | POA: Diagnosis not present

## 2013-03-15 DIAGNOSIS — M25669 Stiffness of unspecified knee, not elsewhere classified: Secondary | ICD-10-CM | POA: Diagnosis not present

## 2013-03-19 DIAGNOSIS — M25669 Stiffness of unspecified knee, not elsewhere classified: Secondary | ICD-10-CM | POA: Diagnosis not present

## 2013-03-19 DIAGNOSIS — M25819 Other specified joint disorders, unspecified shoulder: Secondary | ICD-10-CM | POA: Diagnosis not present

## 2013-03-19 DIAGNOSIS — M75120 Complete rotator cuff tear or rupture of unspecified shoulder, not specified as traumatic: Secondary | ICD-10-CM | POA: Diagnosis not present

## 2013-03-19 DIAGNOSIS — Z1231 Encounter for screening mammogram for malignant neoplasm of breast: Secondary | ICD-10-CM | POA: Diagnosis not present

## 2013-03-19 DIAGNOSIS — M6281 Muscle weakness (generalized): Secondary | ICD-10-CM | POA: Diagnosis not present

## 2013-03-20 DIAGNOSIS — E039 Hypothyroidism, unspecified: Secondary | ICD-10-CM | POA: Diagnosis not present

## 2013-03-20 DIAGNOSIS — I10 Essential (primary) hypertension: Secondary | ICD-10-CM | POA: Diagnosis not present

## 2013-03-20 DIAGNOSIS — R5381 Other malaise: Secondary | ICD-10-CM | POA: Diagnosis not present

## 2013-03-21 DIAGNOSIS — M75120 Complete rotator cuff tear or rupture of unspecified shoulder, not specified as traumatic: Secondary | ICD-10-CM | POA: Diagnosis not present

## 2013-03-21 DIAGNOSIS — M25819 Other specified joint disorders, unspecified shoulder: Secondary | ICD-10-CM | POA: Diagnosis not present

## 2013-03-21 DIAGNOSIS — M6281 Muscle weakness (generalized): Secondary | ICD-10-CM | POA: Diagnosis not present

## 2013-03-21 DIAGNOSIS — M25669 Stiffness of unspecified knee, not elsewhere classified: Secondary | ICD-10-CM | POA: Diagnosis not present

## 2013-03-24 DIAGNOSIS — M67919 Unspecified disorder of synovium and tendon, unspecified shoulder: Secondary | ICD-10-CM | POA: Diagnosis not present

## 2013-03-26 DIAGNOSIS — M76899 Other specified enthesopathies of unspecified lower limb, excluding foot: Secondary | ICD-10-CM | POA: Diagnosis not present

## 2013-03-26 DIAGNOSIS — M25559 Pain in unspecified hip: Secondary | ICD-10-CM | POA: Diagnosis not present

## 2013-03-28 ENCOUNTER — Encounter: Payer: Self-pay | Admitting: Obstetrics & Gynecology

## 2013-03-29 ENCOUNTER — Encounter: Payer: Self-pay | Admitting: Obstetrics & Gynecology

## 2013-03-29 ENCOUNTER — Ambulatory Visit (INDEPENDENT_AMBULATORY_CARE_PROVIDER_SITE_OTHER): Payer: Medicare Other | Admitting: Obstetrics & Gynecology

## 2013-03-29 VITALS — BP 138/80 | HR 60 | Resp 12 | Ht 61.0 in | Wt 114.4 lb

## 2013-03-29 DIAGNOSIS — R1900 Intra-abdominal and pelvic swelling, mass and lump, unspecified site: Secondary | ICD-10-CM | POA: Diagnosis not present

## 2013-03-29 DIAGNOSIS — M75120 Complete rotator cuff tear or rupture of unspecified shoulder, not specified as traumatic: Secondary | ICD-10-CM | POA: Diagnosis not present

## 2013-03-29 DIAGNOSIS — Z01419 Encounter for gynecological examination (general) (routine) without abnormal findings: Secondary | ICD-10-CM

## 2013-03-29 DIAGNOSIS — M25819 Other specified joint disorders, unspecified shoulder: Secondary | ICD-10-CM | POA: Diagnosis not present

## 2013-03-29 DIAGNOSIS — M25669 Stiffness of unspecified knee, not elsewhere classified: Secondary | ICD-10-CM | POA: Diagnosis not present

## 2013-03-29 DIAGNOSIS — M6281 Muscle weakness (generalized): Secondary | ICD-10-CM | POA: Diagnosis not present

## 2013-03-29 NOTE — Patient Instructions (Signed)

## 2013-03-29 NOTE — Progress Notes (Addendum)
72 y.o. [id number] SingleCaucasianF here for annual exam.  Larey Seat jumping over a dog gate and she dislocated right shoulder.  Ultimately ended up with major surgery 01/05/13.  Still in physical therapy.    On Librax for IBS symptoms.  Really under control.    Patient's last menstrual period was 09/28/1979.          Sexually active: no  The current method of family planning is post menopausal status.    Exercising: yes  walking Smoker:  no  Health Maintenance: Pap:  03/09/12 WNL History of abnormal Pap:  no MMG:  6/14 normal [facility] Colonoscopy:02/2007, Dr Ewing Schlein follow up due.  Patient will check on this. BMD:   2011 TDaP:  Up to date Screening Labs: PCP, Hb today: PCP, Urine today: PCP   reports that she quit smoking about 53 years ago. She has never used smokeless tobacco. She reports that she drinks about 2.5 ounces of alcohol per week. She reports that she does not use illicit drugs.  Past Medical History  Diagnosis Date  . Chest pain   . Hypercholesterolemia   . Normal coronary arteries     by heart Cath  . MVP (mitral valve prolapse)     Hx of   . HTN (hypertension)     no meds  . Seasonal allergies   . IBS (irritable bowel syndrome)   . Anxiety   . Hypothyroidism   . Arthritis   . Right rotator cuff tear 01/05/2013  . Impingement syndrome of right shoulder 01/05/2013  . 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 Biceps tendonitis 01/05/2013  . Hypothyroid   . Osteopenia     Past Surgical History  Procedure Laterality Date  . Lasik    . Tonsillectomy    . Tubal ligation    . Cardiac catheterization  2004    which revealed smooth and normal coronary arteries  . Tonsillectomy    . Cosmetic surgery  2010    eyes-facial  . Carpal tunnel release  2000    lt  . Colonoscopy    . Dental surgery  1960  . Shoulder adhesion release  3/14    sedated for dislocation shoulder-  . Shoulder arthroscopy with rotator cuff repair Right 01/05/2013    Procedure: SHOULDER ARTHROSCOPY WITH  ARTHROSCOPIC  ROTATOR CUFF REPAIR, ACROMIOPLASTY, EXTENSIVE DEBRIDEMENT;  Surgeon: Eulas Post, MD;  Location: [facility];  Service: Orthopedics;  Laterality: Right;    Current Outpatient Prescriptions  Medication Sig Dispense Refill  . aspirin 81 MG tablet Take 81 mg by mouth at bedtime.       . Calcium Carbonate-Vitamin D (CALCIUM + D PO) Take by mouth daily.      . clidinium-chlordiazePOXIDE (LIBRAX) 2.5-5 MG per capsule Take 1 capsule by mouth every morning.      . clonazePAM (KLONOPIN) 0.5 MG tablet Take 0.25 mg by mouth daily as needed for anxiety.      . cycloSPORINE (RESTASIS) 0.05 % ophthalmic emulsion Place 1 drop into both eyes every 12 (twelve) hours.       Marland Kitchen levothyroxine (SYNTHROID, LEVOTHROID) 88 MCG tablet Take 88 mcg by mouth daily.      . Multiple Vitamins-Minerals (PRESERVISION AREDS PO) Take 1 tablet by mouth 2 (two) times daily.       . naproxen sodium (ANAPROX) 220 MG tablet Take 220 mg by mouth 2 (two) times daily with a meal.      . omeprazole (PRILOSEC) 20 MG capsule Take 20  mg by mouth daily.       No current facility-administered medications for this visit.    Family History  Problem Relation Age of Onset  . Bladder Cancer Father   . Cancer Maternal Grandfather   . Mitral valve prolapse Mother     ROS:  Pertinent items are noted in HPI.  Otherwise, a comprehensive ROS was negative.  Exam:   BP 138/80  Pulse 60  Resp 12  Ht 5\' 1"  (1.549 m)  Wt 114 lb 6.4 oz (51.891 kg)  BMI 21.63 kg/m2  LMP 09/28/1979  Weight change: -5lbs  Height: 5\' 1"  (154.9 cm)  Ht Readings from Last 3 Encounters:  03/29/13 5\' 1"  (1.549 m)  01/04/13 5' 1.5" (1.562 m)  01/04/13 5' 1.5" (1.562 m)    General appearance: alert, cooperative and appears stated age Head: Normocephalic, without obvious abnormality, atraumatic Neck: no adenopathy, supple, symmetrical, trachea midline and thyroid normal to inspection and palpation Lungs: clear to auscultation  bilaterally Breasts: normal appearance, no masses or tenderness Heart: regular rate and rhythm Abdomen: soft, non-tender; bowel sounds normal; no masses,  no organomegaly Extremities: extremities normal, atraumatic, no cyanosis or edema Skin: Skin color, texture, turgor normal. No rashes or lesions Lymph nodes: Cervical, supraclavicular, and axillary nodes normal. No abnormal inguinal nodes palpated Neurologic: Grossly normal   Pelvic: External genitalia:  no lesions              Urethra:  normal appearing urethra with no masses, tenderness or lesions              Bartholins and Skenes: normal                 Vagina: normal appearing vagina with normal color and discharge, no lesions              Cervix: no lesions              Pap taken: no Bimanual Exam:  Uterus:  normal size, contour, position, consistency, mobility, non-tender              Adnexa:  Normal left side, fullness on right--possible stool               Rectovaginal: Confirms               Anus:  normal sphincter tone, no lesions  A:  Well Woman with normal exam PMP, No HRT IBS, long history Hypothyroidism Osteopenia Recent right shoulder surgery  P:   Mammogram yearly pap smear every other year.  Patient and I have discussed guidelines and she feels more comfortable with every other year screening. Release for colonoscopy, labs, and MMG (MMG and Lab results scanned into system.  Stool test neg.  No colonoscopy report by 05/10/13) Return for PUS.  I may be feeling stool and she is aware of this.  Just want to be sure. return annually or prn  An After Visit Summary was printed and given to the patient.

## 2013-04-02 DIAGNOSIS — M6281 Muscle weakness (generalized): Secondary | ICD-10-CM | POA: Diagnosis not present

## 2013-04-02 DIAGNOSIS — M25669 Stiffness of unspecified knee, not elsewhere classified: Secondary | ICD-10-CM | POA: Diagnosis not present

## 2013-04-02 DIAGNOSIS — M75120 Complete rotator cuff tear or rupture of unspecified shoulder, not specified as traumatic: Secondary | ICD-10-CM | POA: Diagnosis not present

## 2013-04-02 DIAGNOSIS — M25819 Other specified joint disorders, unspecified shoulder: Secondary | ICD-10-CM | POA: Diagnosis not present

## 2013-04-04 DIAGNOSIS — M19019 Primary osteoarthritis, unspecified shoulder: Secondary | ICD-10-CM | POA: Diagnosis not present

## 2013-04-05 DIAGNOSIS — M25819 Other specified joint disorders, unspecified shoulder: Secondary | ICD-10-CM | POA: Diagnosis not present

## 2013-04-05 DIAGNOSIS — M25669 Stiffness of unspecified knee, not elsewhere classified: Secondary | ICD-10-CM | POA: Diagnosis not present

## 2013-04-05 DIAGNOSIS — M75120 Complete rotator cuff tear or rupture of unspecified shoulder, not specified as traumatic: Secondary | ICD-10-CM | POA: Diagnosis not present

## 2013-04-05 DIAGNOSIS — M6281 Muscle weakness (generalized): Secondary | ICD-10-CM | POA: Diagnosis not present

## 2013-04-09 DIAGNOSIS — M25819 Other specified joint disorders, unspecified shoulder: Secondary | ICD-10-CM | POA: Diagnosis not present

## 2013-04-09 DIAGNOSIS — M6281 Muscle weakness (generalized): Secondary | ICD-10-CM | POA: Diagnosis not present

## 2013-04-09 DIAGNOSIS — M25669 Stiffness of unspecified knee, not elsewhere classified: Secondary | ICD-10-CM | POA: Diagnosis not present

## 2013-04-09 DIAGNOSIS — M75120 Complete rotator cuff tear or rupture of unspecified shoulder, not specified as traumatic: Secondary | ICD-10-CM | POA: Diagnosis not present

## 2013-04-11 DIAGNOSIS — M6281 Muscle weakness (generalized): Secondary | ICD-10-CM | POA: Diagnosis not present

## 2013-04-11 DIAGNOSIS — M25819 Other specified joint disorders, unspecified shoulder: Secondary | ICD-10-CM | POA: Diagnosis not present

## 2013-04-11 DIAGNOSIS — M25669 Stiffness of unspecified knee, not elsewhere classified: Secondary | ICD-10-CM | POA: Diagnosis not present

## 2013-04-11 DIAGNOSIS — M75120 Complete rotator cuff tear or rupture of unspecified shoulder, not specified as traumatic: Secondary | ICD-10-CM | POA: Diagnosis not present

## 2013-04-16 ENCOUNTER — Ambulatory Visit (INDEPENDENT_AMBULATORY_CARE_PROVIDER_SITE_OTHER): Payer: Medicare Other | Admitting: Obstetrics & Gynecology

## 2013-04-16 ENCOUNTER — Encounter: Payer: Self-pay | Admitting: Obstetrics & Gynecology

## 2013-04-16 ENCOUNTER — Ambulatory Visit (INDEPENDENT_AMBULATORY_CARE_PROVIDER_SITE_OTHER): Payer: Medicare Other

## 2013-04-16 DIAGNOSIS — R1031 Right lower quadrant pain: Secondary | ICD-10-CM | POA: Diagnosis not present

## 2013-04-16 DIAGNOSIS — R1900 Intra-abdominal and pelvic swelling, mass and lump, unspecified site: Secondary | ICD-10-CM

## 2013-04-16 DIAGNOSIS — R198 Other specified symptoms and signs involving the digestive system and abdomen: Secondary | ICD-10-CM

## 2013-04-16 DIAGNOSIS — M75120 Complete rotator cuff tear or rupture of unspecified shoulder, not specified as traumatic: Secondary | ICD-10-CM | POA: Diagnosis not present

## 2013-04-16 DIAGNOSIS — M25669 Stiffness of unspecified knee, not elsewhere classified: Secondary | ICD-10-CM | POA: Diagnosis not present

## 2013-04-16 DIAGNOSIS — N83339 Acquired atrophy of ovary and fallopian tube, unspecified side: Secondary | ICD-10-CM | POA: Diagnosis not present

## 2013-04-16 NOTE — Patient Instructions (Addendum)
Please call if you have any new issues.

## 2013-04-16 NOTE — Progress Notes (Signed)
72 y.o.Singlefemale here for a pelvic ultrasound.  Questionable pelvic mass vs stool noted on physical exam.  She has IBS and always has bowel issues/bloating.  PUS recommended.  She is here for this today.  Patient's last menstrual period was 09/28/1979.  Images reviewed personally with patient.   FINDINGS: UTERUS: 3.9 x 3.3 x 3.2cm EMS: 1.64mm ADNEXA:   Left ovary 1.3 x 1.0 x 0.6cm, atrohpic   Right ovary 1.5 x 1.2 x 1.4cm with 3mm cyst noted, otherwise atrohpic CUL DE SAC: no free fluid noted  Assessment:  Normal PMP findings with atrophic ovaries Plan: follow-up for routine exam in one year.  Patient reassured.  All questions answered.    ~15 minutes spent with patient >50% of time was in face to face discussion of above.

## 2013-04-17 DIAGNOSIS — M75120 Complete rotator cuff tear or rupture of unspecified shoulder, not specified as traumatic: Secondary | ICD-10-CM | POA: Diagnosis not present

## 2013-04-17 DIAGNOSIS — M6281 Muscle weakness (generalized): Secondary | ICD-10-CM | POA: Diagnosis not present

## 2013-04-17 DIAGNOSIS — M25819 Other specified joint disorders, unspecified shoulder: Secondary | ICD-10-CM | POA: Diagnosis not present

## 2013-04-17 DIAGNOSIS — M25669 Stiffness of unspecified knee, not elsewhere classified: Secondary | ICD-10-CM | POA: Diagnosis not present

## 2013-04-18 DIAGNOSIS — M25669 Stiffness of unspecified knee, not elsewhere classified: Secondary | ICD-10-CM | POA: Diagnosis not present

## 2013-04-18 DIAGNOSIS — M25519 Pain in unspecified shoulder: Secondary | ICD-10-CM | POA: Diagnosis not present

## 2013-04-18 DIAGNOSIS — M75120 Complete rotator cuff tear or rupture of unspecified shoulder, not specified as traumatic: Secondary | ICD-10-CM | POA: Diagnosis not present

## 2013-04-18 DIAGNOSIS — M25819 Other specified joint disorders, unspecified shoulder: Secondary | ICD-10-CM | POA: Diagnosis not present

## 2013-04-18 DIAGNOSIS — M6281 Muscle weakness (generalized): Secondary | ICD-10-CM | POA: Diagnosis not present

## 2013-04-19 DIAGNOSIS — M75120 Complete rotator cuff tear or rupture of unspecified shoulder, not specified as traumatic: Secondary | ICD-10-CM | POA: Diagnosis not present

## 2013-04-19 DIAGNOSIS — M25669 Stiffness of unspecified knee, not elsewhere classified: Secondary | ICD-10-CM | POA: Diagnosis not present

## 2013-04-19 DIAGNOSIS — M25519 Pain in unspecified shoulder: Secondary | ICD-10-CM | POA: Diagnosis not present

## 2013-04-19 DIAGNOSIS — M6281 Muscle weakness (generalized): Secondary | ICD-10-CM | POA: Diagnosis not present

## 2013-04-19 DIAGNOSIS — M25819 Other specified joint disorders, unspecified shoulder: Secondary | ICD-10-CM | POA: Diagnosis not present

## 2013-04-23 ENCOUNTER — Other Ambulatory Visit: Payer: Self-pay | Admitting: Obstetrics & Gynecology

## 2013-04-23 ENCOUNTER — Other Ambulatory Visit: Payer: Self-pay

## 2013-04-23 DIAGNOSIS — M25669 Stiffness of unspecified knee, not elsewhere classified: Secondary | ICD-10-CM | POA: Diagnosis not present

## 2013-04-23 DIAGNOSIS — M6281 Muscle weakness (generalized): Secondary | ICD-10-CM | POA: Diagnosis not present

## 2013-04-23 DIAGNOSIS — M75120 Complete rotator cuff tear or rupture of unspecified shoulder, not specified as traumatic: Secondary | ICD-10-CM | POA: Diagnosis not present

## 2013-04-23 DIAGNOSIS — M25819 Other specified joint disorders, unspecified shoulder: Secondary | ICD-10-CM | POA: Diagnosis not present

## 2013-05-04 DIAGNOSIS — M6281 Muscle weakness (generalized): Secondary | ICD-10-CM | POA: Diagnosis not present

## 2013-05-04 DIAGNOSIS — M25819 Other specified joint disorders, unspecified shoulder: Secondary | ICD-10-CM | POA: Diagnosis not present

## 2013-05-04 DIAGNOSIS — M75120 Complete rotator cuff tear or rupture of unspecified shoulder, not specified as traumatic: Secondary | ICD-10-CM | POA: Diagnosis not present

## 2013-05-04 DIAGNOSIS — M25669 Stiffness of unspecified knee, not elsewhere classified: Secondary | ICD-10-CM | POA: Diagnosis not present

## 2013-05-07 DIAGNOSIS — M6281 Muscle weakness (generalized): Secondary | ICD-10-CM | POA: Diagnosis not present

## 2013-05-07 DIAGNOSIS — M75120 Complete rotator cuff tear or rupture of unspecified shoulder, not specified as traumatic: Secondary | ICD-10-CM | POA: Diagnosis not present

## 2013-05-07 DIAGNOSIS — M25819 Other specified joint disorders, unspecified shoulder: Secondary | ICD-10-CM | POA: Diagnosis not present

## 2013-05-07 DIAGNOSIS — M25669 Stiffness of unspecified knee, not elsewhere classified: Secondary | ICD-10-CM | POA: Diagnosis not present

## 2013-05-11 DIAGNOSIS — M6281 Muscle weakness (generalized): Secondary | ICD-10-CM | POA: Diagnosis not present

## 2013-05-11 DIAGNOSIS — M25819 Other specified joint disorders, unspecified shoulder: Secondary | ICD-10-CM | POA: Diagnosis not present

## 2013-05-11 DIAGNOSIS — M75120 Complete rotator cuff tear or rupture of unspecified shoulder, not specified as traumatic: Secondary | ICD-10-CM | POA: Diagnosis not present

## 2013-05-11 DIAGNOSIS — M25669 Stiffness of unspecified knee, not elsewhere classified: Secondary | ICD-10-CM | POA: Diagnosis not present

## 2013-05-14 DIAGNOSIS — M25819 Other specified joint disorders, unspecified shoulder: Secondary | ICD-10-CM | POA: Diagnosis not present

## 2013-05-14 DIAGNOSIS — M6281 Muscle weakness (generalized): Secondary | ICD-10-CM | POA: Diagnosis not present

## 2013-05-14 DIAGNOSIS — M75120 Complete rotator cuff tear or rupture of unspecified shoulder, not specified as traumatic: Secondary | ICD-10-CM | POA: Diagnosis not present

## 2013-05-14 DIAGNOSIS — M25669 Stiffness of unspecified knee, not elsewhere classified: Secondary | ICD-10-CM | POA: Diagnosis not present

## 2013-05-21 DIAGNOSIS — E039 Hypothyroidism, unspecified: Secondary | ICD-10-CM | POA: Diagnosis not present

## 2013-05-21 DIAGNOSIS — J309 Allergic rhinitis, unspecified: Secondary | ICD-10-CM | POA: Diagnosis not present

## 2013-05-21 DIAGNOSIS — L0291 Cutaneous abscess, unspecified: Secondary | ICD-10-CM | POA: Diagnosis not present

## 2013-05-21 DIAGNOSIS — IMO0002 Reserved for concepts with insufficient information to code with codable children: Secondary | ICD-10-CM | POA: Diagnosis not present

## 2013-05-21 DIAGNOSIS — I10 Essential (primary) hypertension: Secondary | ICD-10-CM | POA: Diagnosis not present

## 2013-05-31 DIAGNOSIS — D3130 Benign neoplasm of unspecified choroid: Secondary | ICD-10-CM | POA: Diagnosis not present

## 2013-05-31 DIAGNOSIS — H3530 Unspecified macular degeneration: Secondary | ICD-10-CM | POA: Diagnosis not present

## 2013-05-31 DIAGNOSIS — H35369 Drusen (degenerative) of macula, unspecified eye: Secondary | ICD-10-CM | POA: Diagnosis not present

## 2013-05-31 DIAGNOSIS — H43399 Other vitreous opacities, unspecified eye: Secondary | ICD-10-CM | POA: Diagnosis not present

## 2013-06-16 DIAGNOSIS — Z79899 Other long term (current) drug therapy: Secondary | ICD-10-CM | POA: Diagnosis not present

## 2013-06-16 DIAGNOSIS — E079 Disorder of thyroid, unspecified: Secondary | ICD-10-CM | POA: Diagnosis not present

## 2013-06-16 DIAGNOSIS — K589 Irritable bowel syndrome without diarrhea: Secondary | ICD-10-CM | POA: Diagnosis not present

## 2013-06-16 DIAGNOSIS — R109 Unspecified abdominal pain: Secondary | ICD-10-CM | POA: Diagnosis not present

## 2013-06-16 DIAGNOSIS — R079 Chest pain, unspecified: Secondary | ICD-10-CM | POA: Diagnosis not present

## 2013-06-18 DIAGNOSIS — R198 Other specified symptoms and signs involving the digestive system and abdomen: Secondary | ICD-10-CM | POA: Diagnosis not present

## 2013-06-18 DIAGNOSIS — R1013 Epigastric pain: Secondary | ICD-10-CM | POA: Diagnosis not present

## 2013-06-20 ENCOUNTER — Telehealth: Payer: Self-pay | Admitting: Cardiovascular Disease

## 2013-06-20 NOTE — Telephone Encounter (Signed)
New Problem  pt states Saturday (9/20) she spent 6 hours in [facility]// was discharged and is still having problems w/ heart issues and pain in the abdomen// blood test came back in good standing.however pt is still experiencing pain.. She inquires if an appt is needed.Marland Kitchen

## 2013-06-20 NOTE — Telephone Encounter (Signed)
Pt states blood work and ct abd w/contrast was all normal from ED visit. Pt continues to have abd pain currently across upper abd, denies diarrhea/ constipation/ cp/nausea/ pt has abd discomfort only. Pt will contact pcp to follow up.

## 2013-06-22 DIAGNOSIS — IMO0002 Reserved for concepts with insufficient information to code with codable children: Secondary | ICD-10-CM | POA: Diagnosis not present

## 2013-06-22 DIAGNOSIS — R1013 Epigastric pain: Secondary | ICD-10-CM | POA: Diagnosis not present

## 2013-06-22 DIAGNOSIS — R079 Chest pain, unspecified: Secondary | ICD-10-CM | POA: Diagnosis not present

## 2013-06-22 DIAGNOSIS — Z23 Encounter for immunization: Secondary | ICD-10-CM | POA: Diagnosis not present

## 2013-06-27 ENCOUNTER — Telehealth: Payer: Self-pay | Admitting: Obstetrics & Gynecology

## 2013-06-27 NOTE — Telephone Encounter (Signed)
Patient calling to speak with Shanda Bumps about a bill she received.

## 2013-07-19 DIAGNOSIS — M25669 Stiffness of unspecified knee, not elsewhere classified: Secondary | ICD-10-CM | POA: Diagnosis not present

## 2013-07-19 DIAGNOSIS — M25819 Other specified joint disorders, unspecified shoulder: Secondary | ICD-10-CM | POA: Diagnosis not present

## 2013-07-19 DIAGNOSIS — M75120 Complete rotator cuff tear or rupture of unspecified shoulder, not specified as traumatic: Secondary | ICD-10-CM | POA: Diagnosis not present

## 2013-07-31 DIAGNOSIS — K589 Irritable bowel syndrome without diarrhea: Secondary | ICD-10-CM | POA: Diagnosis not present

## 2013-07-31 DIAGNOSIS — R079 Chest pain, unspecified: Secondary | ICD-10-CM | POA: Diagnosis not present

## 2013-07-31 DIAGNOSIS — D649 Anemia, unspecified: Secondary | ICD-10-CM | POA: Diagnosis not present

## 2013-07-31 DIAGNOSIS — R109 Unspecified abdominal pain: Secondary | ICD-10-CM | POA: Diagnosis not present

## 2013-07-31 DIAGNOSIS — F39 Unspecified mood [affective] disorder: Secondary | ICD-10-CM | POA: Diagnosis not present

## 2013-07-31 DIAGNOSIS — IMO0002 Reserved for concepts with insufficient information to code with codable children: Secondary | ICD-10-CM | POA: Diagnosis not present

## 2013-07-31 DIAGNOSIS — R1013 Epigastric pain: Secondary | ICD-10-CM | POA: Diagnosis not present

## 2013-09-04 DIAGNOSIS — J309 Allergic rhinitis, unspecified: Secondary | ICD-10-CM | POA: Diagnosis not present

## 2013-09-04 DIAGNOSIS — H6980 Other specified disorders of Eustachian tube, unspecified ear: Secondary | ICD-10-CM | POA: Diagnosis not present

## 2013-09-04 DIAGNOSIS — IMO0002 Reserved for concepts with insufficient information to code with codable children: Secondary | ICD-10-CM | POA: Diagnosis not present

## 2013-09-04 DIAGNOSIS — H6120 Impacted cerumen, unspecified ear: Secondary | ICD-10-CM | POA: Diagnosis not present

## 2013-09-07 DIAGNOSIS — L299 Pruritus, unspecified: Secondary | ICD-10-CM | POA: Diagnosis not present

## 2013-09-07 DIAGNOSIS — D239 Other benign neoplasm of skin, unspecified: Secondary | ICD-10-CM | POA: Diagnosis not present

## 2013-09-07 DIAGNOSIS — L821 Other seborrheic keratosis: Secondary | ICD-10-CM | POA: Diagnosis not present

## 2013-09-07 DIAGNOSIS — L820 Inflamed seborrheic keratosis: Secondary | ICD-10-CM | POA: Diagnosis not present

## 2013-10-15 DIAGNOSIS — E039 Hypothyroidism, unspecified: Secondary | ICD-10-CM | POA: Diagnosis not present

## 2013-10-15 DIAGNOSIS — M899 Disorder of bone, unspecified: Secondary | ICD-10-CM | POA: Diagnosis not present

## 2013-10-15 DIAGNOSIS — M949 Disorder of cartilage, unspecified: Secondary | ICD-10-CM | POA: Diagnosis not present

## 2013-10-15 DIAGNOSIS — R82998 Other abnormal findings in urine: Secondary | ICD-10-CM | POA: Diagnosis not present

## 2013-10-15 DIAGNOSIS — E785 Hyperlipidemia, unspecified: Secondary | ICD-10-CM | POA: Diagnosis not present

## 2013-10-15 DIAGNOSIS — I10 Essential (primary) hypertension: Secondary | ICD-10-CM | POA: Diagnosis not present

## 2013-10-17 DIAGNOSIS — Z1331 Encounter for screening for depression: Secondary | ICD-10-CM | POA: Diagnosis not present

## 2013-10-17 DIAGNOSIS — K589 Irritable bowel syndrome without diarrhea: Secondary | ICD-10-CM | POA: Diagnosis not present

## 2013-10-17 DIAGNOSIS — E039 Hypothyroidism, unspecified: Secondary | ICD-10-CM | POA: Diagnosis not present

## 2013-10-17 DIAGNOSIS — F39 Unspecified mood [affective] disorder: Secondary | ICD-10-CM | POA: Diagnosis not present

## 2013-10-17 DIAGNOSIS — E785 Hyperlipidemia, unspecified: Secondary | ICD-10-CM | POA: Diagnosis not present

## 2013-10-17 DIAGNOSIS — K219 Gastro-esophageal reflux disease without esophagitis: Secondary | ICD-10-CM | POA: Diagnosis not present

## 2013-10-17 DIAGNOSIS — M899 Disorder of bone, unspecified: Secondary | ICD-10-CM | POA: Diagnosis not present

## 2013-10-17 DIAGNOSIS — M949 Disorder of cartilage, unspecified: Secondary | ICD-10-CM | POA: Diagnosis not present

## 2013-10-17 DIAGNOSIS — I10 Essential (primary) hypertension: Secondary | ICD-10-CM | POA: Diagnosis not present

## 2013-10-17 DIAGNOSIS — Z0000 Encounter for general adult medical examination without abnormal findings: Secondary | ICD-10-CM | POA: Diagnosis not present

## 2013-10-18 DIAGNOSIS — M25669 Stiffness of unspecified knee, not elsewhere classified: Secondary | ICD-10-CM | POA: Diagnosis not present

## 2013-10-18 DIAGNOSIS — M75120 Complete rotator cuff tear or rupture of unspecified shoulder, not specified as traumatic: Secondary | ICD-10-CM | POA: Diagnosis not present

## 2013-10-19 DIAGNOSIS — Z1212 Encounter for screening for malignant neoplasm of rectum: Secondary | ICD-10-CM | POA: Diagnosis not present

## 2013-11-21 DIAGNOSIS — H04129 Dry eye syndrome of unspecified lacrimal gland: Secondary | ICD-10-CM | POA: Diagnosis not present

## 2013-11-21 DIAGNOSIS — H43819 Vitreous degeneration, unspecified eye: Secondary | ICD-10-CM | POA: Diagnosis not present

## 2013-11-21 DIAGNOSIS — H353190 Nonexudative age-related macular degeneration, unspecified eye, stage unspecified: Secondary | ICD-10-CM | POA: Diagnosis not present

## 2013-11-29 DIAGNOSIS — H35369 Drusen (degenerative) of macula, unspecified eye: Secondary | ICD-10-CM | POA: Diagnosis not present

## 2013-11-29 DIAGNOSIS — H3530 Unspecified macular degeneration: Secondary | ICD-10-CM | POA: Diagnosis not present

## 2013-11-29 DIAGNOSIS — D3130 Benign neoplasm of unspecified choroid: Secondary | ICD-10-CM | POA: Diagnosis not present

## 2013-11-29 DIAGNOSIS — H43399 Other vitreous opacities, unspecified eye: Secondary | ICD-10-CM | POA: Diagnosis not present

## 2013-12-13 DIAGNOSIS — H04129 Dry eye syndrome of unspecified lacrimal gland: Secondary | ICD-10-CM | POA: Diagnosis not present

## 2013-12-31 DIAGNOSIS — L570 Actinic keratosis: Secondary | ICD-10-CM | POA: Diagnosis not present

## 2013-12-31 DIAGNOSIS — L821 Other seborrheic keratosis: Secondary | ICD-10-CM | POA: Diagnosis not present

## 2013-12-31 DIAGNOSIS — L259 Unspecified contact dermatitis, unspecified cause: Secondary | ICD-10-CM | POA: Diagnosis not present

## 2014-01-03 DIAGNOSIS — R1013 Epigastric pain: Secondary | ICD-10-CM | POA: Diagnosis not present

## 2014-02-06 ENCOUNTER — Encounter: Payer: Self-pay | Admitting: Obstetrics & Gynecology

## 2014-03-14 DIAGNOSIS — M75120 Complete rotator cuff tear or rupture of unspecified shoulder, not specified as traumatic: Secondary | ICD-10-CM | POA: Diagnosis not present

## 2014-04-18 ENCOUNTER — Ambulatory Visit: Payer: Medicare Other | Admitting: Obstetrics & Gynecology

## 2014-04-30 ENCOUNTER — Encounter: Payer: Self-pay | Admitting: Obstetrics & Gynecology

## 2014-04-30 ENCOUNTER — Ambulatory Visit (INDEPENDENT_AMBULATORY_CARE_PROVIDER_SITE_OTHER): Payer: Medicare Other | Admitting: Obstetrics & Gynecology

## 2014-04-30 VITALS — BP 150/90 | HR 60 | Resp 16 | Ht 61.0 in | Wt 116.0 lb

## 2014-04-30 DIAGNOSIS — Z01419 Encounter for gynecological examination (general) (routine) without abnormal findings: Secondary | ICD-10-CM | POA: Diagnosis not present

## 2014-04-30 DIAGNOSIS — Z124 Encounter for screening for malignant neoplasm of cervix: Secondary | ICD-10-CM

## 2014-04-30 NOTE — Progress Notes (Signed)
73 y.o. G30P2002 Married CaucasianF here for annual exam.  Husband had DVT for several weeks until this was diagnosed.  This has bene very stressful for pt.  Ended up having shoulder surgery last year after having shoulder dislocation.  Ultimately had arthroscopy and had severed tendons.  Doing really well, from that standpoint.    Pt reports she has had a lot of anxiety and the sensation of this in her chest and throat.  No SOB.  Reports had GI issues and saw Dr. Watt Climes several times.  Figured out that GI issues were related to yogurt.  Decreasing this really has helped.    No vaginal bleeding.    Dr. Dagmar Hait is PCP.  Saw him sometime over the last year.  Does have AEX scheduled.    Patient's last menstrual period was 09/28/1979.          Sexually active: No.  The current method of family planning is post menopausal status.    Exercising: Yes.    walking and stretching Smoker:  Former smoker  Health Maintenance: Pap:  03/09/12 WNL History of abnormal Pap:  no MMG:  03/19/13-normal Colonoscopy:  6/08-repeat in 5 years BMD:   2011-osteopenia TDaP:  PCP Screening Labs: PCP, Hb today: PCP, Urine today: PCP   reports that she quit smoking about 54 years ago. She has never used smokeless tobacco. She reports that she drinks alcohol. She reports that she does not use illicit drugs.  Past Medical History  Diagnosis Date  . Chest pain   . Hypercholesterolemia   . Normal coronary arteries     by heart Cath  . MVP (mitral valve prolapse)     Hx of   . HTN (hypertension)     no meds  . Seasonal allergies   . IBS (irritable bowel syndrome)   . Anxiety   . Hypothyroidism   . Arthritis   . Right rotator cuff tear 01/05/2013  . Impingement syndrome of right shoulder 01/05/2013  . 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 Biceps tendonitis 01/05/2013  . Hypothyroid   . Osteopenia     Past Surgical History  Procedure Laterality Date  . Lasik    . Tonsillectomy    . Tubal ligation    . Cardiac  catheterization  2004    which revealed smooth and normal coronary arteries  . Cosmetic surgery  2010    eyes-facial  . Carpal tunnel release  2000    lt  . Dental surgery  1960  . Shoulder adhesion release  3/14    sedated for dislocation shoulder-  . Shoulder arthroscopy with rotator cuff repair Right 01/05/2013    Procedure: SHOULDER ARTHROSCOPY WITH ARTHROSCOPIC  ROTATOR CUFF REPAIR, ACROMIOPLASTY, EXTENSIVE DEBRIDEMENT;  Surgeon: Johnny Bridge, MD;  Location: [facility];  Service: Orthopedics;  Laterality: Right;    Current Outpatient Prescriptions  Medication Sig Dispense Refill  . aspirin 81 MG tablet Take 81 mg by mouth at bedtime.       . Calcium Carbonate-Vitamin D (CALCIUM + D PO) Take by mouth daily.      . clidinium-chlordiazePOXIDE (LIBRAX) 2.5-5 MG per capsule Take 1 capsule by mouth every morning.      . clonazePAM (KLONOPIN) 0.5 MG tablet Take 0.25 mg by mouth daily as needed for anxiety.      . cycloSPORINE (RESTASIS) 0.05 % ophthalmic emulsion Place 1 drop into both eyes every 12 (twelve) hours.       Marland Kitchen levothyroxine (SYNTHROID, LEVOTHROID)  88 MCG tablet Take 88 mcg by mouth daily.      . Multiple Vitamins-Minerals (PRESERVISION AREDS PO) Take 1 tablet by mouth 2 (two) times daily.       . naproxen sodium (ANAPROX) 220 MG tablet Take 220 mg by mouth 2 (two) times daily with a meal.       No current facility-administered medications for this visit.    Family History  Problem Relation Age of Onset  . Bladder Cancer Father   . Cancer Maternal Grandfather   . Mitral valve prolapse Mother     ROS:  Pertinent items are noted in HPI.  Otherwise, a comprehensive ROS was negative.  Exam:   BP 160/84  Pulse 60  Resp 16  Ht 5\' 1"  (1.549 m)  Wt 116 lb (52.617 kg)  BMI 21.93 kg/m2  LMP 09/28/1979   Height: 5\' 1"  (154.9 cm)  Ht Readings from Last 3 Encounters:  04/30/14 5\' 1"  (1.549 m)  03/29/13 5\' 1"  (1.549 m)  01/04/13 5' 1.5" (1.562 m)     General appearance: alert, cooperative and appears stated age Head: Normocephalic, without obvious abnormality, atraumatic Neck: no adenopathy, supple, symmetrical, trachea midline and thyroid normal to inspection and palpation Lungs: clear to auscultation bilaterally Breasts: normal appearance, no masses or tenderness Heart: regular rate and rhythm Abdomen: soft, non-tender; bowel sounds normal; no masses,  no organomegaly Extremities: extremities normal, atraumatic, no cyanosis or edema Skin: Skin color, texture, turgor normal. No rashes or lesions Lymph nodes: Cervical, supraclavicular, and axillary nodes normal. No abnormal inguinal nodes palpated Neurologic: Grossly normal   Pelvic: External genitalia:  no lesions              Urethra:  normal appearing urethra with no masses, tenderness or lesions              Bartholins and Skenes: normal                 Vagina: normal appearing vagina with normal color and discharge, no lesions              Cervix: no lesions              Pap taken: Yes.   Bimanual Exam:  Uterus:  normal size, contour, position, consistency, mobility, non-tender              Adnexa: normal adnexa and no mass, fullness, tenderness               Rectovaginal: Confirms               Anus:  normal sphincter tone, no lesions  A:  Well Woman with normal exam  PMP, No HRT  IBS, long history  Hypothyroidism  Osteopenia  Dislocated shoulder 2013, with surgery 2014  P: Mammogram yearly.  We will schedule for her.   pap smear every other year. Patient and I have discussed guidelines and she feels more comfortable with every other year screening.  Pap today. Labs with Dr. Dagmar Hait every year. return annually or prn  An After Visit Summary was printed and given to the patient.

## 2014-05-01 LAB — IPS PAP SMEAR ONLY

## 2014-05-07 ENCOUNTER — Telehealth: Payer: Self-pay

## 2014-05-07 NOTE — Telephone Encounter (Signed)
Patient notified that MMG has been scheduled at [facility] on 05/15/14 at 3:00. She will call back if this does not work for her schedule.//kn

## 2014-05-15 DIAGNOSIS — Z1231 Encounter for screening mammogram for malignant neoplasm of breast: Secondary | ICD-10-CM | POA: Diagnosis not present

## 2014-05-24 DIAGNOSIS — J309 Allergic rhinitis, unspecified: Secondary | ICD-10-CM | POA: Diagnosis not present

## 2014-05-24 DIAGNOSIS — J329 Chronic sinusitis, unspecified: Secondary | ICD-10-CM | POA: Diagnosis not present

## 2014-05-29 ENCOUNTER — Other Ambulatory Visit: Payer: Self-pay | Admitting: Allergy

## 2014-05-29 DIAGNOSIS — J329 Chronic sinusitis, unspecified: Secondary | ICD-10-CM

## 2014-05-31 ENCOUNTER — Ambulatory Visit
Admission: RE | Admit: 2014-05-31 | Discharge: 2014-05-31 | Disposition: A | Discharge status: Home / Self Care | Payer: Medicare Other | Source: Ambulatory Visit | Attending: Allergy | Admitting: Allergy

## 2014-05-31 DIAGNOSIS — J329 Chronic sinusitis, unspecified: Secondary | ICD-10-CM | POA: Diagnosis not present

## 2014-06-22 DIAGNOSIS — Z23 Encounter for immunization: Secondary | ICD-10-CM | POA: Diagnosis not present

## 2014-06-26 DIAGNOSIS — R198 Other specified symptoms and signs involving the digestive system and abdomen: Secondary | ICD-10-CM | POA: Diagnosis not present

## 2014-06-26 DIAGNOSIS — R1013 Epigastric pain: Secondary | ICD-10-CM | POA: Diagnosis not present

## 2014-07-01 DIAGNOSIS — L821 Other seborrheic keratosis: Secondary | ICD-10-CM | POA: Diagnosis not present

## 2014-07-01 DIAGNOSIS — L820 Inflamed seborrheic keratosis: Secondary | ICD-10-CM | POA: Diagnosis not present

## 2014-07-01 DIAGNOSIS — L649 Androgenic alopecia, unspecified: Secondary | ICD-10-CM | POA: Diagnosis not present

## 2014-07-11 DIAGNOSIS — Z87891 Personal history of nicotine dependence: Secondary | ICD-10-CM | POA: Diagnosis not present

## 2014-07-11 DIAGNOSIS — H3531 Nonexudative age-related macular degeneration: Secondary | ICD-10-CM | POA: Diagnosis not present

## 2014-07-11 DIAGNOSIS — Z7982 Long term (current) use of aspirin: Secondary | ICD-10-CM | POA: Diagnosis not present

## 2014-07-11 DIAGNOSIS — H04123 Dry eye syndrome of bilateral lacrimal glands: Secondary | ICD-10-CM | POA: Diagnosis not present

## 2014-07-11 DIAGNOSIS — Z79899 Other long term (current) drug therapy: Secondary | ICD-10-CM | POA: Diagnosis not present

## 2014-07-11 DIAGNOSIS — D3131 Benign neoplasm of right choroid: Secondary | ICD-10-CM | POA: Diagnosis not present

## 2014-07-18 DIAGNOSIS — H6123 Impacted cerumen, bilateral: Secondary | ICD-10-CM | POA: Diagnosis not present

## 2014-07-18 DIAGNOSIS — J310 Chronic rhinitis: Secondary | ICD-10-CM | POA: Diagnosis not present

## 2014-07-20 ENCOUNTER — Ambulatory Visit (INDEPENDENT_AMBULATORY_CARE_PROVIDER_SITE_OTHER): Payer: Medicare Other | Admitting: Family Medicine

## 2014-07-20 ENCOUNTER — Ambulatory Visit (INDEPENDENT_AMBULATORY_CARE_PROVIDER_SITE_OTHER): Payer: Medicare Other

## 2014-07-20 VITALS — BP 142/82 | HR 67 | Temp 98.4°F | Resp 20 | Ht 61.5 in | Wt 116.2 lb

## 2014-07-20 DIAGNOSIS — R0789 Other chest pain: Secondary | ICD-10-CM

## 2014-07-20 DIAGNOSIS — R059 Cough, unspecified: Secondary | ICD-10-CM

## 2014-07-20 DIAGNOSIS — R05 Cough: Secondary | ICD-10-CM

## 2014-07-20 DIAGNOSIS — R042 Hemoptysis: Secondary | ICD-10-CM

## 2014-07-20 MED ORDER — AZITHROMYCIN 250 MG PO TABS: ORAL_TABLET | ORAL | Status: DC

## 2014-07-20 NOTE — Patient Instructions (Addendum)
mucinex or mucinex DM for cough, fluids and voice rest. Can try zantac over the counter in case some heartburn cause of upper chest soreness and cough. Tylenol or ibuprofen as needed, but if chest pain changes, worsens, or any shortness of breath - recheck here or in emergency room.   If cough not improving in next 2-3 days - can start Zpak, but your initial symptoms sound like a virus that should usually start to improve by 10 days.   Return to the clinic or go to the nearest emergency room if any of your symptoms worsen or new symptoms occur.    Chest Wall Pain Chest wall pain is pain in or around the bones and muscles of your chest. It may take up to 6 weeks to get better. It may take longer if you must stay physically active in your work and activities.  CAUSES  Chest wall pain may happen on its own. However, it may be caused by:  A viral illness like the flu.  Injury.  Coughing.  Exercise.  Arthritis.  Fibromyalgia.  Shingles. HOME CARE INSTRUCTIONS   Avoid overtiring physical activity. Try not to strain or perform activities that cause pain. This includes any activities using your chest or your abdominal and side muscles, especially if heavy weights are used.  Put ice on the sore area.  Put ice in a plastic bag.  Place a towel between your skin and the bag.  Leave the ice on for 15-20 minutes per hour while awake for the first 2 days.  Only take over-the-counter or prescription medicines for pain, discomfort, or fever as directed by your caregiver. SEEK IMMEDIATE MEDICAL CARE IF:   Your pain increases, or you are very uncomfortable.  You have a fever.  Your chest pain becomes worse.  You have new, unexplained symptoms.  You have nausea or vomiting.  You feel sweaty or lightheaded.  You have a cough with phlegm (sputum), or you cough up blood. MAKE SURE YOU:   Understand these instructions.  Will watch your condition.  Will get help right away if you  are not doing well or get worse. Document Released: 09/13/2005 Document Revised: 12/06/2011 Document Reviewed: 05/10/2011 [facility] Patient Information 2015 [place], [place]. This information is not intended to replace advice given to you by your health care provider. Make sure you discuss any questions you have with your health care provider.   Return to the clinic or go to the nearest emergency room if any of your symptoms worsen or new symptoms occur. Cough, Adult  A cough is a reflex that helps clear your throat and airways. It can help heal the body or may be a reaction to an irritated airway. A cough may only last 2 or 3 weeks (acute) or may last more than 8 weeks (chronic).  CAUSES Acute cough:  Viral or bacterial infections. Chronic cough:  Infections.  Allergies.  Asthma.  Post-nasal drip.  Smoking.  Heartburn or acid reflux.  Some medicines.  Chronic lung problems (COPD).  Cancer. SYMPTOMS   Cough.  Fever.  Chest pain.  Increased breathing rate.  High-pitched whistling sound when breathing (wheezing).  Colored mucus that you cough up (sputum). TREATMENT   A bacterial cough may be treated with antibiotic medicine.  A viral cough must run its course and will not respond to antibiotics.  Your caregiver may recommend other treatments if you have a chronic cough. HOME CARE INSTRUCTIONS   Only take over-the-counter or prescription medicines for pain,  discomfort, or fever as directed by your caregiver. Use cough suppressants only as directed by your caregiver.  Use a cold steam vaporizer or humidifier in your bedroom or home to help loosen secretions.  Sleep in a semi-upright position if your cough is worse at night.  Rest as needed.  Stop smoking if you smoke. SEEK IMMEDIATE MEDICAL CARE IF:   You have pus in your sputum.  Your cough starts to worsen.  You cannot control your cough with suppressants and are losing sleep.  You begin coughing up  blood.  You have difficulty breathing.  You develop pain which is getting worse or is uncontrolled with medicine.  You have a fever. MAKE SURE YOU:   Understand these instructions.  Will watch your condition.  Will get help right away if you are not doing well or get worse. Document Released: 03/12/2011 Document Revised: 12/06/2011 Document Reviewed: 03/12/2011 [facility] Patient Information 2015 [place], [place]. This information is not intended to replace advice given to you by your health care provider. Make sure you discuss any questions you have with your health care provider.

## 2014-07-20 NOTE — Progress Notes (Addendum)
Subjective:    Patient ID: Bianca Garcia, female    DOB: [birth year], 73 y.o.   MRN: [id number]  Cough Associated symptoms include chest pain and a sore throat. Pertinent negatives include no chills, fever, rash or rhinorrhea.   Chief Complaint  Patient presents with  . Cough    hoarseness this morning, cough with chest congestion light brown/clear    This chart was scribed for Merri Ray, MD by Thea Alken, ED Scribe. This patient was seen in room 10 and the patient's care was started at 3:14 PM.  HPI Comments: Bianca Garcia is a 73 y.o. female who presents to the [facility] complaining of productive cough consisting light brown/clear mucous. Pt reports symptoms started with cough and sore throat but states she woke up this morning hoarse, and has had constant upper chest pressure for the past week and congestion. Pt states she called PCP this morning with symptoms and advised her to be seen. She reports 2 days ago she coughed up mucous consisting a little blood. She describes CP as burning that radiates up to bilateral ears. She denies hx of heart problems. She also denies CP changing with activity.  Pt states her temperature was 98.6 this morning. Pt is taking Singulair and flonase for allergies. Pt is a former smoker who quit 15-20 years ago. Pt denies fever, chills, rash, rhinorrhea. Pt is allergic to penicillin, sulfa and codeine.    Patient Active Problem List   Diagnosis Date Noted  . Hypothyroid   . Right rotator cuff tear 01/05/2013  . Impingement syndrome of right shoulder 01/05/2013  . 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 Biceps tendonitis 01/05/2013  . HTN (hypertension) 12/22/2011  . Hyperlipidemia 12/22/2011   Past Medical History  Diagnosis Date  . Chest pain   . Hypercholesterolemia   . Normal coronary arteries     by heart Cath  . MVP (mitral valve prolapse)     Hx of   . HTN (hypertension)     no meds  . Seasonal allergies   . IBS  (irritable bowel syndrome)   . Anxiety   . Hypothyroidism   . Arthritis   . Right rotator cuff tear 01/05/2013  . Impingement syndrome of right shoulder 01/05/2013  . 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 Biceps tendonitis 01/05/2013  . Hypothyroid   . Osteopenia    Past Surgical History  Procedure Laterality Date  . Lasik    . Tonsillectomy    . Tubal ligation    . Cardiac catheterization  2004    which revealed smooth and normal coronary arteries  . Cosmetic surgery  2010    eyes-facial  . Carpal tunnel release  2000    lt  . Dental surgery  1960  . Shoulder adhesion release  3/14    sedated for dislocation shoulder-  . Shoulder arthroscopy with rotator cuff repair Right 01/05/2013    Procedure: SHOULDER ARTHROSCOPY WITH ARTHROSCOPIC  ROTATOR CUFF REPAIR, ACROMIOPLASTY, EXTENSIVE DEBRIDEMENT;  Surgeon: Johnny Bridge, MD;  Location: [facility];  Service: Orthopedics;  Laterality: Right;   Allergies  Allergen Reactions  . Codeine   . Crestor [Rosuvastatin Calcium]     Intolerant   . Lipitor [Atorvastatin Calcium]     Intolerant   . Penicillins   . Sulfa Antibiotics     vomiting   Prior to Admission medications   Medication Sig Start Date End Date Taking? Authorizing Provider  aspirin 81  MG tablet Take 81 mg by mouth at bedtime.    Yes Historical Provider, MD  [place] alk-PHENObarbital (DONNATAL) 16.2 MG tablet Take 1 tablet by mouth as needed.   Yes Historical Provider, MD  Calcium Carbonate-Vitamin D (CALCIUM + D PO) Take by mouth daily.   Yes Historical Provider, MD  clonazePAM (KLONOPIN) 0.5 MG tablet Take 0.25 mg by mouth daily as needed for anxiety.   Yes Historical Provider, MD  cycloSPORINE (RESTASIS) 0.05 % ophthalmic emulsion Place 1 drop into both eyes every 12 (twelve) hours.    Yes Historical Provider, MD  fluticasone (FLONASE) 50 MCG/ACT nasal spray Place 2 sprays into both nostrils daily.   Yes Historical Provider, MD  levothyroxine (SYNTHROID,  LEVOTHROID) 88 MCG tablet Take 88 mcg by mouth daily.   Yes Historical Provider, MD  montelukast (SINGULAIR) 10 MG tablet Take 10 mg by mouth at bedtime.   Yes Historical Provider, MD  Multiple Vitamins-Minerals (PRESERVISION AREDS PO) Take 1 tablet by mouth 2 (two) times daily.    Yes Historical Provider, MD  naproxen sodium (ANAPROX) 220 MG tablet Take 220 mg by mouth 2 (two) times daily with a meal.   Yes Historical Provider, MD  Probiotic Product (ALIGN) 4 MG CAPS Take 1 capsule by mouth daily.   Yes Historical Provider, MD   History   Social History  . Marital Status: Single    Spouse Name: N/A    Number of Children: N/A  . Years of Education: N/A   Occupational History  . Not on file.   Social History Main Topics  . Smoking status: Former Smoker    Quit date: 01/05/1960  . Smokeless tobacco: Never Used     Comment: Remote Hx  . Alcohol Use: Yes  . Drug Use: No  . Sexual Activity: No   Other Topics Concern  . Not on file   Social History Narrative  . No narrative on file   Review of Systems  Constitutional: Negative for fever and chills.  HENT: Positive for congestion, sore throat and voice change. Negative for rhinorrhea.   Respiratory: Positive for cough.   Cardiovascular: Positive for chest pain.  Skin: Negative for rash.   Objective:   Physical Exam  Nursing note and vitals reviewed. Constitutional: She is oriented to person, place, and time. She appears well-developed and well-nourished. No distress.  HENT:  Head: Normocephalic and atraumatic.  Right Ear: Hearing, tympanic membrane, external ear and ear canal normal.  Left Ear: Hearing, tympanic membrane, external ear and ear canal normal.  Nose: Nose normal. Right sinus exhibits no maxillary sinus tenderness and no frontal sinus tenderness. Left sinus exhibits no maxillary sinus tenderness and no frontal sinus tenderness.  Mouth/Throat: Oropharynx is clear and moist. No oropharyngeal exudate.  Eyes:  Conjunctivae and EOM are normal. Pupils are equal, round, and reactive to light.  Neck: Neck supple.  Cardiovascular: Normal rate, regular rhythm, normal heart sounds and intact distal pulses.   No murmur heard. Pulmonary/Chest: Effort normal and breath sounds normal. No respiratory distress. She has no wheezes. She has no rhonchi.  Musculoskeletal: Normal range of motion. She exhibits no edema.  Neurological: She is alert and oriented to person, place, and time.  Skin: Skin is warm and dry. No rash noted.  Psychiatric: She has a normal mood and affect. Her behavior is normal.   Filed Vitals:   07/20/14 1459  BP: 142/82  Pulse: 67  Temp: 98.4 F (36.9 C)  TempSrc: Oral  Resp: 20  Height: 5' 1.5" (1.562 m)  Weight: 116 lb 3.2 oz (52.708 kg)  SpO2: 99%   EKG: sr, no acute findings.   [facility] reading (PRIMARY) by  Dr. Carlota Raspberry: CXR: no acute findings.    Assessment & Plan:   Bianca Garcia is a 73 y.o. female Cough - Plan: DG Chest 2 View, azithromycin (ZITHROMAX) 250 MG tablet  Chest wall pain - Plan: DG Chest 2 View, EKG 12-Lead  Hemoptysis  Suspected URI progressing to LRTI/bronchitis and chest wall pain from cough. EKG reassuring, CXR without acute findings. Few small amounts of blood from repeat coughing likely cause, but if recurs - RTC to discuss further. -sx care, voice rest, mucinex and can try zantac in case some LPR cause of cough. Zpak if needed if not improving in next few days, but RTC if any worsening. Understanding expressed.   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  No acute infiltrate.    Meds ordered this encounter  Medications  . fluticasone (FLONASE) 50 MCG/ACT nasal spray    Sig: Place 2 sprays into both nostrils daily.  . montelukast (SINGULAIR) 10 MG tablet    Sig: Take 10 mg by mouth at bedtime.  . Probiotic Product (ALIGN) 4 MG CAPS    Sig: Take 1 capsule by mouth daily.  [place] alk-PHENObarbital  (DONNATAL) 16.2 MG tablet    Sig: Take 1 tablet by mouth as needed.  Marland Kitchen azithromycin (ZITHROMAX) 250 MG tablet    Sig: Take 2 pills by mouth on day 1, then 1 pill by mouth per day on days 2 through 5.    Dispense:  6 tablet    Refill:  0   Patient Instructions  mucinex or mucinex DM for cough, fluids and voice rest. Can try zantac over the counter in case some heartburn cause of upper chest soreness and cough. Tylenol or ibuprofen as needed, but if chest pain changes, worsens, or any shortness of breath - recheck here or in emergency room.   If cough not improving in next 2-3 days - can start Zpak, but your initial symptoms sound like a virus that should usually start to improve by 10 days.   Return to the clinic or go to the nearest emergency room if any of your symptoms worsen or new symptoms occur.    Chest Wall Pain Chest wall pain is pain in or around the bones and muscles of your chest. It may take up to 6 weeks to get better. It may take longer if you must stay physically active in your work and activities.  CAUSES  Chest wall pain may happen on its own. However, it may be caused by:  A viral illness like the flu.  Injury.  Coughing.  Exercise.  Arthritis.  Fibromyalgia.  Shingles. HOME CARE INSTRUCTIONS   Avoid overtiring physical activity. Try not to strain or perform activities that cause pain. This includes any activities using your chest or your abdominal and side muscles, especially if heavy weights are used.  Put ice on the sore area.  Put ice in a plastic bag.  Place a towel between your skin and the bag.  Leave the ice on for 15-20 minutes per hour while awake for the first 2 days.  Only take over-the-counter or prescription medicines for pain, discomfort, or fever as directed by your caregiver. SEEK IMMEDIATE MEDICAL CARE IF:   Your pain increases, or you are very uncomfortable.  You have a fever.  Your chest pain becomes worse.  You have new,  unexplained symptoms.  You have nausea or vomiting.  You feel sweaty or lightheaded.  You have a cough with phlegm (sputum), or you cough up blood. MAKE SURE YOU:   Understand these instructions.  Will watch your condition.  Will get help right away if you are not doing well or get worse. Document Released: 09/13/2005 Document Revised: 12/06/2011 Document Reviewed: 05/10/2011 [facility] Patient Information 2015 [place], [place]. This information is not intended to replace advice given to you by your health care provider. Make sure you discuss any questions you have with your health care provider.   Return to the clinic or go to the nearest emergency room if any of your symptoms worsen or new symptoms occur. Cough, Adult  A cough is a reflex that helps clear your throat and airways. It can help heal the body or may be a reaction to an irritated airway. A cough may only last 2 or 3 weeks (acute) or may last more than 8 weeks (chronic).  CAUSES Acute cough:  Viral or bacterial infections. Chronic cough:  Infections.  Allergies.  Asthma.  Post-nasal drip.  Smoking.  Heartburn or acid reflux.  Some medicines.  Chronic lung problems (COPD).  Cancer. SYMPTOMS   Cough.  Fever.  Chest pain.  Increased breathing rate.  High-pitched whistling sound when breathing (wheezing).  Colored mucus that you cough up (sputum). TREATMENT   A bacterial cough may be treated with antibiotic medicine.  A viral cough must run its course and will not respond to antibiotics.  Your caregiver may recommend other treatments if you have a chronic cough. HOME CARE INSTRUCTIONS   Only take over-the-counter or prescription medicines for pain, discomfort, or fever as directed by your caregiver. Use cough suppressants only as directed by your caregiver.  Use a cold steam vaporizer or humidifier in your bedroom or home to help loosen secretions.  Sleep in a semi-upright position if your  cough is worse at night.  Rest as needed.  Stop smoking if you smoke. SEEK IMMEDIATE MEDICAL CARE IF:   You have pus in your sputum.  Your cough starts to worsen.  You cannot control your cough with suppressants and are losing sleep.  You begin coughing up blood.  You have difficulty breathing.  You develop pain which is getting worse or is uncontrolled with medicine.  You have a fever. MAKE SURE YOU:   Understand these instructions.  Will watch your condition.  Will get help right away if you are not doing well or get worse. Document Released: 03/12/2011 Document Revised: 12/06/2011 Document Reviewed: 03/12/2011 [facility] Patient Information 2015 [place], [place]. This information is not intended to replace advice given to you by your health care provider. Make sure you discuss any questions you have with your health care provider.     I personally performed the services described in this documentation, which was scribed in my presence. The recorded information has been reviewed and considered, and addended by me as needed.

## 2014-07-22 DIAGNOSIS — Z6821 Body mass index (BMI) 21.0-21.9, adult: Secondary | ICD-10-CM | POA: Diagnosis not present

## 2014-07-22 DIAGNOSIS — J069 Acute upper respiratory infection, unspecified: Secondary | ICD-10-CM | POA: Diagnosis not present

## 2014-07-22 DIAGNOSIS — R05 Cough: Secondary | ICD-10-CM | POA: Diagnosis not present

## 2014-09-25 ENCOUNTER — Telehealth: Payer: Self-pay | Admitting: Obstetrics & Gynecology

## 2014-09-25 NOTE — Telephone Encounter (Signed)
Patient calling with request to schedule appointment with Dr. Sabra Heck to request "reassurance." Patient's best friend recently diagnosed with ovarian cancer. Feels that she has been having symptoms as her friend and these symptoms have been occurring for two years. Patient reports intermittent abdominal pain on the L side of navel, when this pain occurs, it radiates to L flank as well. Patient states she has "everything you can think of" for relief and the pain continues.   Last Pelvic ultrasound with Dr. Sabra Heck 03/2013-no masses.   Patient with hx of IBS followed by Dr. Watt Climes, last visit with him was "a couple of months ago." Patient reports she described same symptoms to provider at last visit and "he thought it was nothing."  Patient not currently having pain during triage. Denies fevers and urinary complaints.   Patient scheduled for office visit with Dr. Sabra Heck 10/10/14.   Patient advised to return call to our office if has any worsening symptoms. Patient agreeable to instructions.   Dr. Sabra Heck, okay to leave as scheduled?

## 2014-09-25 NOTE — Telephone Encounter (Signed)
Unless she wants to come earlier, this is fine.   Thanks.

## 2014-09-25 NOTE — Telephone Encounter (Signed)
Pt is having abdominal pain and would like to see Dr Sabra Heck.

## 2014-09-27 DIAGNOSIS — M1990 Unspecified osteoarthritis, unspecified site: Secondary | ICD-10-CM

## 2014-09-27 HISTORY — DX: Unspecified osteoarthritis, unspecified site: M19.90

## 2014-09-30 DIAGNOSIS — H04123 Dry eye syndrome of bilateral lacrimal glands: Secondary | ICD-10-CM | POA: Diagnosis not present

## 2014-10-10 ENCOUNTER — Ambulatory Visit (INDEPENDENT_AMBULATORY_CARE_PROVIDER_SITE_OTHER): Payer: Medicare Other | Admitting: Obstetrics & Gynecology

## 2014-10-10 ENCOUNTER — Encounter: Payer: Self-pay | Admitting: Obstetrics & Gynecology

## 2014-10-10 VITALS — BP 120/72 | HR 68 | Resp 12 | Wt 116.2 lb

## 2014-10-10 DIAGNOSIS — R1011 Right upper quadrant pain: Secondary | ICD-10-CM

## 2014-10-10 DIAGNOSIS — R1012 Left upper quadrant pain: Secondary | ICD-10-CM | POA: Diagnosis not present

## 2014-10-10 DIAGNOSIS — R195 Other fecal abnormalities: Secondary | ICD-10-CM | POA: Diagnosis not present

## 2014-10-10 LAB — HEPATIC FUNCTION PANEL
ALT: 13 U/L (ref 0–35)
AST: 18 U/L (ref 0–37)
Albumin: 3.9 g/dL (ref 3.5–5.2)
Alkaline Phosphatase: 69 U/L (ref 39–117)
Bilirubin, Direct: 0.1 mg/dL (ref 0.0–0.3)
Indirect Bilirubin: 0.2 mg/dL (ref 0.2–1.2)
Total Bilirubin: 0.3 mg/dL (ref 0.2–1.2)
Total Protein: 6.9 g/dL (ref 6.0–8.3)

## 2014-10-10 LAB — CBC
HCT: 40.1 % (ref 36.0–46.0)
Hemoglobin: 13.6 g/dL (ref 12.0–15.0)
MCH: 31.3 pg (ref 26.0–34.0)
MCHC: 33.9 g/dL (ref 30.0–36.0)
MCV: 92.2 fL (ref 78.0–100.0)
MPV: 10.2 fL (ref 8.6–12.4)
Platelets: 294 10*3/uL (ref 150–400)
RBC: 4.35 MIL/uL (ref 3.87–5.11)
RDW: 13.5 % (ref 11.5–15.5)
WBC: 6.3 10*3/uL (ref 4.0–10.5)

## 2014-10-10 LAB — BASIC METABOLIC PANEL
BUN: 22 mg/dL (ref 6–23)
CO2: 31 mEq/L (ref 19–32)
Calcium: 9.1 mg/dL (ref 8.4–10.5)
Chloride: 100 mEq/L (ref 96–112)
Creat: 0.73 mg/dL (ref 0.50–1.10)
Glucose, Bld: 84 mg/dL (ref 70–99)
Potassium: 4.1 mEq/L (ref 3.5–5.3)
Sodium: 138 mEq/L (ref 135–145)

## 2014-10-10 NOTE — Progress Notes (Signed)
Subjective:     Patient ID: Bianca Garcia, female   DOB: Jul 31, 1941, 74 y.o.   MRN: [id number]  HPI 74 yo MWF here for complaint of worsening mid epigastric, RUQ and LUQ pain.  Pt reports she feels it mildly much of the time but over the last several weeks, she can feel it first thing in the mornign when she wake up and when she goes to get out of bed, the pain is much more severe--as if bending makes it worse.  Denies burping.  No chest pain.  Pt has long hx of loose bowel movements and of IBS symptoms.  Can have up to 12 bowel movements in a day but recently has been having more like 7-8.  Denies any fever or weight loss.  Does report out 10 days ago, she had a day or so of white stools.  This was very different for her so she took note.  In the past, she has undergone evaluation with CT scan (2008), abdominal ultrasound (2012), and pelvic ultrasound last year in July here in my office.  She has been followed for years by Dr. Watt Climes and had a colonoscopy in 2008.  Pt has never had endoscopy for evaluation of this.  Pt also reports a good friend who has had similar symptoms was diagnosed with stage IV ovarian cancer.  Of course, this has made patient even more nervous.    Reports  Review of Systems  Constitutional: Negative.  Negative for unexpected weight change.  Gastrointestinal: Positive for abdominal pain and diarrhea.  Genitourinary: Negative.   Musculoskeletal: Negative.   Skin: Negative.   Hematological: Negative.   All other systems reviewed and are negative.      Objective:   Physical Exam  Constitutional: She appears well-developed and well-nourished.  Cardiovascular: Normal rate and regular rhythm.   Pulmonary/Chest: Effort normal and breath sounds normal.  Abdominal: Soft. Bowel sounds are normal. She exhibits no distension and no mass. There is tenderness (across all of upper abdomen). There is no rebound and no guarding.  Genitourinary: Vagina normal and uterus normal. There  is no rash, tenderness or lesion on the right labia. There is no rash, tenderness or lesion on the left labia. Uterus is not enlarged, not fixed and not tender. Cervix exhibits no motion tenderness. Right adnexum displays no mass, no tenderness and no fullness. Left adnexum displays no mass, no tenderness and no fullness.  Lymphadenopathy:       Right: No inguinal adenopathy present.       Left: No inguinal adenopathy present.  Skin: Skin is warm and dry.  Psychiatric: She has a normal mood and affect. Her behavior is normal.  Vitals reviewed.      Assessment:     Mid-epigastric, RUQ and LUQ pain H/O IBS     Plan:     CBC, BMP, liver profile Plan CT abdomen and pelvic with IV/PO contrast

## 2014-10-10 NOTE — Progress Notes (Signed)
Patient scheduled for CT Abdomen and pelvis with contrast for 10/16/14 at 1230 at [facility] at [address], [place] [address]. Patient agreeable. Labs Pending.

## 2014-10-15 DIAGNOSIS — H04123 Dry eye syndrome of bilateral lacrimal glands: Secondary | ICD-10-CM | POA: Diagnosis not present

## 2014-10-16 ENCOUNTER — Ambulatory Visit
Admission: RE | Admit: 2014-10-16 | Discharge: 2014-10-16 | Disposition: A | Discharge status: Home / Self Care | Payer: Medicare Other | Source: Ambulatory Visit | Attending: Obstetrics & Gynecology | Admitting: Obstetrics & Gynecology

## 2014-10-16 DIAGNOSIS — R1011 Right upper quadrant pain: Secondary | ICD-10-CM

## 2014-10-16 DIAGNOSIS — R109 Unspecified abdominal pain: Secondary | ICD-10-CM | POA: Diagnosis not present

## 2014-10-16 DIAGNOSIS — R1012 Left upper quadrant pain: Secondary | ICD-10-CM

## 2014-10-16 DIAGNOSIS — R195 Other fecal abnormalities: Secondary | ICD-10-CM | POA: Diagnosis not present

## 2014-10-16 MED ORDER — IOHEXOL 300 MG/ML  SOLN
100.0000 mL | Freq: Once | INTRAMUSCULAR | Status: AC | PRN
Start: 2014-10-16 — End: 2014-10-16
  Administered 2014-10-16: 100 mL via INTRAVENOUS

## 2014-10-22 ENCOUNTER — Telehealth: Payer: Self-pay

## 2014-10-22 NOTE — Telephone Encounter (Signed)
Spoke with patient. Advised patient of results as seen below. Patient is agreeable and verbalizes understanding. Patient is currently in the car and will call back to reschedule 2 month follow up with Dr.Miller.  Routing to provider for final review. Patient agreeable to disposition. Will close encounter

## 2014-10-22 NOTE — Telephone Encounter (Signed)
-----   Message from Lyman Speller, MD sent at 10/20/2014  8:10 AM EST ----- Notified via myChart but pt needs to be called.  CT showed "large stool burden".  Also, some degenerative changes noted in lumbar spine.  Otherwise was normal.  Pt needs to start colace 100mg  1-2 times daily to see if improves what appears to be constipation.  May also use Miralax.  May need to start every other day and can increase to daily if needed.  Recheck with me 2 months.  Note to pt via MyChart: Bianca Garcia, Your CT was negative for anything worrisome.  No evidence of cancer.  It showed a large volume of stool throughout your colon.  This could cause all the symptoms you are having.  One of my nurses will call on Monday (hopefully) and go over it with you in more detail and offer some suggestions.   Dr. Sabra Heck

## 2014-11-27 DIAGNOSIS — R030 Elevated blood-pressure reading, without diagnosis of hypertension: Secondary | ICD-10-CM | POA: Diagnosis not present

## 2014-11-27 DIAGNOSIS — M47816 Spondylosis without myelopathy or radiculopathy, lumbar region: Secondary | ICD-10-CM | POA: Diagnosis not present

## 2014-11-27 DIAGNOSIS — M47812 Spondylosis without myelopathy or radiculopathy, cervical region: Secondary | ICD-10-CM | POA: Diagnosis not present

## 2014-12-04 DIAGNOSIS — H3530 Unspecified macular degeneration: Secondary | ICD-10-CM | POA: Diagnosis not present

## 2014-12-04 DIAGNOSIS — H1045 Other chronic allergic conjunctivitis: Secondary | ICD-10-CM | POA: Diagnosis not present

## 2014-12-04 DIAGNOSIS — H04123 Dry eye syndrome of bilateral lacrimal glands: Secondary | ICD-10-CM | POA: Diagnosis not present

## 2014-12-04 DIAGNOSIS — H25043 Posterior subcapsular polar age-related cataract, bilateral: Secondary | ICD-10-CM | POA: Diagnosis not present

## 2014-12-04 DIAGNOSIS — H2513 Age-related nuclear cataract, bilateral: Secondary | ICD-10-CM | POA: Diagnosis not present

## 2014-12-04 DIAGNOSIS — H43813 Vitreous degeneration, bilateral: Secondary | ICD-10-CM | POA: Diagnosis not present

## 2014-12-11 DIAGNOSIS — R030 Elevated blood-pressure reading, without diagnosis of hypertension: Secondary | ICD-10-CM | POA: Diagnosis not present

## 2014-12-11 DIAGNOSIS — M5416 Radiculopathy, lumbar region: Secondary | ICD-10-CM | POA: Diagnosis not present

## 2014-12-13 ENCOUNTER — Ambulatory Visit (INDEPENDENT_AMBULATORY_CARE_PROVIDER_SITE_OTHER): Payer: Medicare Other | Admitting: Obstetrics & Gynecology

## 2014-12-13 ENCOUNTER — Encounter: Payer: Self-pay | Admitting: Obstetrics & Gynecology

## 2014-12-13 VITALS — BP 120/76 | HR 78 | Resp 12 | Ht 61.5 in | Wt 114.6 lb

## 2014-12-13 DIAGNOSIS — R1084 Generalized abdominal pain: Secondary | ICD-10-CM

## 2014-12-13 NOTE — Progress Notes (Signed)
Subjective:     Patient ID: SALOMA CADENA, female   DOB: 1940-10-11, 74 y.o.   MRN: [id number]  HPI 74 yo G2P2 MWF here for follow up for abdominal pain.  Pt has long term issues with this, however, pt was having so much issue in January that she came to seem me.  Abdominal exam with increased tenderness along left side.  CT was obtained showing only increased stool burden.  Colace and miralax suggested.  Pt reports Miralax was too much and made her loose but the colace has worked very well.  Stools are larger now and within a few days the pain started to improve.  Reports she has an occasional episode of pain but this is fleeting.  She also reports her abdomen is much softer and is not tender when she touches it.  Really feels she is overall, much better.  Review of Systems  All other systems reviewed and are negative.      Objective:   Physical Exam  Constitutional: She is oriented to person, place, and time. She appears well-developed and well-nourished.  Abdominal: Soft. Bowel sounds are normal. She exhibits no distension and no mass. There is no tenderness. There is no rebound and no guarding.  Neurological: She is alert and oriented to person, place, and time.  Psychiatric: She has a normal mood and affect.       Assessment:     Abdominal pain Large stool burden on CT scan H/O IBS.  Has been seen for years by Dr. Watt Climes     Plan:     Pt will follow up for her AEX as scheduled  Continue colace daily or every other day.

## 2014-12-17 ENCOUNTER — Other Ambulatory Visit: Payer: Self-pay | Admitting: Neurosurgery

## 2014-12-17 DIAGNOSIS — M47812 Spondylosis without myelopathy or radiculopathy, cervical region: Secondary | ICD-10-CM

## 2014-12-17 DIAGNOSIS — M47816 Spondylosis without myelopathy or radiculopathy, lumbar region: Secondary | ICD-10-CM

## 2014-12-20 DIAGNOSIS — M4806 Spinal stenosis, lumbar region: Secondary | ICD-10-CM | POA: Diagnosis not present

## 2014-12-20 DIAGNOSIS — M542 Cervicalgia: Secondary | ICD-10-CM | POA: Diagnosis not present

## 2014-12-20 DIAGNOSIS — M5136 Other intervertebral disc degeneration, lumbar region: Secondary | ICD-10-CM | POA: Diagnosis not present

## 2014-12-20 DIAGNOSIS — M47812 Spondylosis without myelopathy or radiculopathy, cervical region: Secondary | ICD-10-CM | POA: Diagnosis not present

## 2014-12-20 DIAGNOSIS — M9973 Connective tissue and disc stenosis of intervertebral foramina of lumbar region: Secondary | ICD-10-CM | POA: Diagnosis not present

## 2014-12-20 DIAGNOSIS — M47816 Spondylosis without myelopathy or radiculopathy, lumbar region: Secondary | ICD-10-CM | POA: Diagnosis not present

## 2014-12-31 ENCOUNTER — Other Ambulatory Visit: Payer: Self-pay | Admitting: Neurosurgery

## 2014-12-31 DIAGNOSIS — M47816 Spondylosis without myelopathy or radiculopathy, lumbar region: Secondary | ICD-10-CM | POA: Diagnosis not present

## 2015-01-03 DIAGNOSIS — H9193 Unspecified hearing loss, bilateral: Secondary | ICD-10-CM | POA: Diagnosis not present

## 2015-01-03 DIAGNOSIS — Z6821 Body mass index (BMI) 21.0-21.9, adult: Secondary | ICD-10-CM | POA: Diagnosis not present

## 2015-01-03 DIAGNOSIS — J209 Acute bronchitis, unspecified: Secondary | ICD-10-CM | POA: Diagnosis not present

## 2015-01-03 DIAGNOSIS — H6123 Impacted cerumen, bilateral: Secondary | ICD-10-CM | POA: Diagnosis not present

## 2015-01-06 DIAGNOSIS — Z7982 Long term (current) use of aspirin: Secondary | ICD-10-CM | POA: Diagnosis not present

## 2015-01-06 DIAGNOSIS — H3531 Nonexudative age-related macular degeneration: Secondary | ICD-10-CM | POA: Diagnosis not present

## 2015-01-06 DIAGNOSIS — H35363 Drusen (degenerative) of macula, bilateral: Secondary | ICD-10-CM | POA: Diagnosis not present

## 2015-01-06 DIAGNOSIS — H43393 Other vitreous opacities, bilateral: Secondary | ICD-10-CM | POA: Diagnosis not present

## 2015-01-06 DIAGNOSIS — Z9889 Other specified postprocedural states: Secondary | ICD-10-CM | POA: Diagnosis not present

## 2015-01-06 DIAGNOSIS — Z7952 Long term (current) use of systemic steroids: Secondary | ICD-10-CM | POA: Diagnosis not present

## 2015-01-06 DIAGNOSIS — Z87891 Personal history of nicotine dependence: Secondary | ICD-10-CM | POA: Diagnosis not present

## 2015-01-06 DIAGNOSIS — H04123 Dry eye syndrome of bilateral lacrimal glands: Secondary | ICD-10-CM | POA: Diagnosis not present

## 2015-01-06 DIAGNOSIS — D3131 Benign neoplasm of right choroid: Secondary | ICD-10-CM | POA: Diagnosis not present

## 2015-01-08 DIAGNOSIS — M47816 Spondylosis without myelopathy or radiculopathy, lumbar region: Secondary | ICD-10-CM | POA: Diagnosis not present

## 2015-01-13 DIAGNOSIS — M5416 Radiculopathy, lumbar region: Secondary | ICD-10-CM | POA: Diagnosis not present

## 2015-01-13 DIAGNOSIS — M4806 Spinal stenosis, lumbar region: Secondary | ICD-10-CM | POA: Diagnosis not present

## 2015-01-13 DIAGNOSIS — M47816 Spondylosis without myelopathy or radiculopathy, lumbar region: Secondary | ICD-10-CM | POA: Diagnosis not present

## 2015-01-15 DIAGNOSIS — M4806 Spinal stenosis, lumbar region: Secondary | ICD-10-CM | POA: Diagnosis not present

## 2015-01-15 DIAGNOSIS — M5416 Radiculopathy, lumbar region: Secondary | ICD-10-CM | POA: Diagnosis not present

## 2015-01-20 DIAGNOSIS — L821 Other seborrheic keratosis: Secondary | ICD-10-CM | POA: Diagnosis not present

## 2015-01-20 DIAGNOSIS — L814 Other melanin hyperpigmentation: Secondary | ICD-10-CM | POA: Diagnosis not present

## 2015-01-20 DIAGNOSIS — L570 Actinic keratosis: Secondary | ICD-10-CM | POA: Diagnosis not present

## 2015-01-20 DIAGNOSIS — M47816 Spondylosis without myelopathy or radiculopathy, lumbar region: Secondary | ICD-10-CM | POA: Diagnosis not present

## 2015-01-22 DIAGNOSIS — E039 Hypothyroidism, unspecified: Secondary | ICD-10-CM | POA: Diagnosis not present

## 2015-01-22 DIAGNOSIS — E785 Hyperlipidemia, unspecified: Secondary | ICD-10-CM | POA: Diagnosis not present

## 2015-01-22 DIAGNOSIS — M859 Disorder of bone density and structure, unspecified: Secondary | ICD-10-CM | POA: Diagnosis not present

## 2015-01-22 DIAGNOSIS — N390 Urinary tract infection, site not specified: Secondary | ICD-10-CM | POA: Diagnosis not present

## 2015-01-22 DIAGNOSIS — R8299 Other abnormal findings in urine: Secondary | ICD-10-CM | POA: Diagnosis not present

## 2015-01-22 DIAGNOSIS — I10 Essential (primary) hypertension: Secondary | ICD-10-CM | POA: Diagnosis not present

## 2015-01-29 DIAGNOSIS — M859 Disorder of bone density and structure, unspecified: Secondary | ICD-10-CM | POA: Diagnosis not present

## 2015-01-29 DIAGNOSIS — I10 Essential (primary) hypertension: Secondary | ICD-10-CM | POA: Diagnosis not present

## 2015-01-29 DIAGNOSIS — E039 Hypothyroidism, unspecified: Secondary | ICD-10-CM | POA: Diagnosis not present

## 2015-01-29 DIAGNOSIS — Z6821 Body mass index (BMI) 21.0-21.9, adult: Secondary | ICD-10-CM | POA: Diagnosis not present

## 2015-01-29 DIAGNOSIS — F39 Unspecified mood [affective] disorder: Secondary | ICD-10-CM | POA: Diagnosis not present

## 2015-01-29 DIAGNOSIS — Z1389 Encounter for screening for other disorder: Secondary | ICD-10-CM | POA: Diagnosis not present

## 2015-01-29 DIAGNOSIS — K589 Irritable bowel syndrome without diarrhea: Secondary | ICD-10-CM | POA: Diagnosis not present

## 2015-01-29 DIAGNOSIS — Z23 Encounter for immunization: Secondary | ICD-10-CM | POA: Diagnosis not present

## 2015-01-29 DIAGNOSIS — Z0000 Encounter for general adult medical examination without abnormal findings: Secondary | ICD-10-CM | POA: Diagnosis not present

## 2015-01-29 DIAGNOSIS — N390 Urinary tract infection, site not specified: Secondary | ICD-10-CM | POA: Diagnosis not present

## 2015-01-29 DIAGNOSIS — M1990 Unspecified osteoarthritis, unspecified site: Secondary | ICD-10-CM | POA: Diagnosis not present

## 2015-01-29 DIAGNOSIS — E785 Hyperlipidemia, unspecified: Secondary | ICD-10-CM | POA: Diagnosis not present

## 2015-01-30 DIAGNOSIS — Z1212 Encounter for screening for malignant neoplasm of rectum: Secondary | ICD-10-CM | POA: Diagnosis not present

## 2015-02-04 DIAGNOSIS — H04123 Dry eye syndrome of bilateral lacrimal glands: Secondary | ICD-10-CM | POA: Diagnosis not present

## 2015-02-12 DIAGNOSIS — E039 Hypothyroidism, unspecified: Secondary | ICD-10-CM | POA: Diagnosis not present

## 2015-03-03 ENCOUNTER — Other Ambulatory Visit (HOSPITAL_COMMUNITY): Payer: Medicare Other

## 2015-03-18 ENCOUNTER — Inpatient Hospital Stay (HOSPITAL_COMMUNITY): Admission: RE | Admit: 2015-03-18 | Payer: Medicare Other | Source: Ambulatory Visit | Admitting: Neurosurgery

## 2015-03-18 ENCOUNTER — Encounter (HOSPITAL_COMMUNITY): Admission: RE | Payer: Self-pay | Source: Ambulatory Visit

## 2015-03-18 SURGERY — ANTERIOR CERVICAL DECOMPRESSION/DISCECTOMY FUSION 2 LEVELS
Anesthesia: General

## 2015-03-24 DIAGNOSIS — N390 Urinary tract infection, site not specified: Secondary | ICD-10-CM | POA: Diagnosis not present

## 2015-03-26 DIAGNOSIS — Z6821 Body mass index (BMI) 21.0-21.9, adult: Secondary | ICD-10-CM | POA: Diagnosis not present

## 2015-03-26 DIAGNOSIS — N898 Other specified noninflammatory disorders of vagina: Secondary | ICD-10-CM | POA: Diagnosis not present

## 2015-04-03 ENCOUNTER — Encounter: Payer: Self-pay | Admitting: Obstetrics and Gynecology

## 2015-04-03 ENCOUNTER — Ambulatory Visit (INDEPENDENT_AMBULATORY_CARE_PROVIDER_SITE_OTHER): Payer: Medicare Other | Admitting: Obstetrics and Gynecology

## 2015-04-03 VITALS — BP 106/70 | HR 76 | Resp 16 | Ht 61.5 in | Wt 115.0 lb

## 2015-04-03 DIAGNOSIS — N905 Atrophy of vulva: Secondary | ICD-10-CM

## 2015-04-03 DIAGNOSIS — R102 Pelvic and perineal pain: Secondary | ICD-10-CM

## 2015-04-03 DIAGNOSIS — N949 Unspecified condition associated with female genital organs and menstrual cycle: Secondary | ICD-10-CM | POA: Diagnosis not present

## 2015-04-03 DIAGNOSIS — N952 Postmenopausal atrophic vaginitis: Secondary | ICD-10-CM

## 2015-04-03 MED ORDER — ESTRADIOL 0.1 MG/GM VA CREA: TOPICAL_CREAM | VAGINAL | Status: DC

## 2015-04-03 NOTE — Patient Instructions (Signed)
Apply a pea sized amount of estrogen on the tender area every night for 2 weeks

## 2015-04-03 NOTE — Progress Notes (Signed)
GYNECOLOGY  VISIT   HPI: 74 y.o.   Married  Caucasian  female   G2P2002 with Patient's last menstrual period was 09/28/1979.   here for   Vaginal irritation that started 2 weeks ago. She saw her primary, normal exam and negative urine. She c/o a constant burning pain of her vulva, no itching, no bleeding, not worsened by touching. Baseline discomfort is a 3/10 in severity, feels aware of it all day, not waking her up at night.  Not sexually active. Never had these symptoms before. She tried vagasil, minimal help.   GYNECOLOGIC HISTORY: Patient's last menstrual period was 09/28/1979. Contraception: Post Menopausal  Menopausal hormone therapy: None Last mammogram: 05/25/14 BIRADS2:Benign  Last pap smear: 04/30/14 Neg        OB History    Gravida Para Term Preterm AB TAB SAB Ectopic Multiple Living   2 2 2       2          Patient Active Problem List   Diagnosis Date Noted  . Hypothyroid   . Right rotator cuff tear 01/05/2013  . Impingement syndrome of right shoulder 01/05/2013  . 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 Biceps tendonitis 01/05/2013  . HTN (hypertension) 12/22/2011  . Hyperlipidemia 12/22/2011    Past Medical History  Diagnosis Date  . Chest pain   . Hypercholesterolemia   . Normal coronary arteries     by heart Cath  . MVP (mitral valve prolapse)     Hx of   . HTN (hypertension)     no meds  . Seasonal allergies   . IBS (irritable bowel syndrome)   . Anxiety   . Hypothyroidism   . Arthritis   . Right rotator cuff tear 01/05/2013  . Impingement syndrome of right shoulder 01/05/2013  . 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 Biceps tendonitis 01/05/2013  . Hypothyroid   . Osteopenia     Past Surgical History  Procedure Laterality Date  . Lasik    . Tonsillectomy    . Tubal ligation    . Cardiac catheterization  2004    which revealed smooth and normal coronary arteries  . Cosmetic surgery  2010    eyes-facial  . Carpal tunnel release  2000    lt  . Dental  surgery  1960  . Shoulder adhesion release  3/14    sedated for dislocation shoulder-  . Shoulder arthroscopy with rotator cuff repair Right 01/05/2013    Procedure: SHOULDER ARTHROSCOPY WITH ARTHROSCOPIC  ROTATOR CUFF REPAIR, ACROMIOPLASTY, EXTENSIVE DEBRIDEMENT;  Surgeon: Johnny Bridge, MD;  Location: [facility];  Service: Orthopedics;  Laterality: Right;    Current Outpatient Prescriptions  Medication Sig Dispense Refill  . aspirin 81 MG tablet Take 81 mg by mouth at bedtime.     . Calcium Carbonate-Vitamin D (CALCIUM + D PO) Take by mouth daily.    . clonazePAM (KLONOPIN) 0.5 MG tablet Take 0.25 mg by mouth daily as needed for anxiety.    . cycloSPORINE (RESTASIS) 0.05 % ophthalmic emulsion Place 1 drop into both eyes every 12 (twelve) hours.     Mariane Baumgarten Sodium (COLACE PO) Take by mouth.    . fluconazole (DIFLUCAN) 150 MG tablet   0  . levothyroxine (SYNTHROID, LEVOTHROID) 88 MCG tablet Take 88 mcg by mouth daily.    Marland Kitchen LOTEMAX 0.5 % GEL INSTILL 1 DROP IN OU TID FOR 2 WEEKS  1  . Multiple Vitamins-Minerals (PRESERVISION AREDS PO) Take 1 tablet by  mouth 2 (two) times daily.     . naproxen sodium (ANAPROX) 220 MG tablet Take 220 mg by mouth as needed.     Marland Kitchen Propylene Glycol (SYSTANE BALANCE OP) Apply to eye.     No current facility-administered medications for this visit.     ALLERGIES: Codeine; Crestor; Lipitor; Penicillins; and Sulfa antibiotics  Family History  Problem Relation Age of Onset  . Bladder Cancer Father   . Cancer Maternal Grandfather   . Mitral valve prolapse Mother     History   Social History  . Marital Status: Married    Spouse Name: N/A  . Number of Children: N/A  . Years of Education: N/A   Occupational History  . Not on file.   Social History Main Topics  . Smoking status: Former Smoker    Quit date: 01/05/1960  . Smokeless tobacco: Never Used     Comment: Remote Hx  . Alcohol Use: Yes  . Drug Use: No  . Sexual Activity: No    Other Topics Concern  . Not on file   Social History Narrative    ROS:  Pertinent items are noted in HPI.  PHYSICAL EXAMINATION:    BP 106/70 mmHg  Pulse 76  Resp 16  Ht 5' 1.5" (1.562 m)  Wt 115 lb (52.164 kg)  BMI 21.38 kg/m2  LMP 09/28/1979    General appearance: alert, cooperative and appears stated age  Pelvic: External genitalia:  no lesions, atrophic, tender with palpation of the vestibule with a cotton swab. No other tenderness.               Urethra:  normal appearing urethra with no masses, tenderness or lesions              Bartholins and Skenes: normal                 Vagina:atrophic vaginal mucosa, no vaginal d/c noted  Cervix without lesions Chaperone was present for exam.  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 Other than atrophy her exam is normal.   PLAN Treat with estrace vaginal cream, apply a pea sized amount to the vestibule qhs x 2 weeks F/U in 2 weeks If not better consider compounded gabapentin to the vulva Reviewed plan with the patient, she understands Send wet prep molecular probe   An After Visit Summary was printed and given to the patient.

## 2015-04-04 LAB — WET PREP BY MOLECULAR PROBE
Candida species: NEGATIVE
Gardnerella vaginalis: NEGATIVE
Trichomonas vaginosis: NEGATIVE

## 2015-04-17 ENCOUNTER — Ambulatory Visit (INDEPENDENT_AMBULATORY_CARE_PROVIDER_SITE_OTHER): Payer: Medicare Other | Admitting: Obstetrics and Gynecology

## 2015-04-17 VITALS — BP 126/60 | HR 76 | Resp 18 | Ht 61.5 in | Wt 114.0 lb

## 2015-04-17 DIAGNOSIS — N94819 Vulvodynia, unspecified: Secondary | ICD-10-CM | POA: Diagnosis not present

## 2015-04-17 DIAGNOSIS — L820 Inflamed seborrheic keratosis: Secondary | ICD-10-CM | POA: Diagnosis not present

## 2015-04-17 MED ORDER — NONFORMULARY OR COMPOUNDED ITEM: Status: DC

## 2015-04-17 MED ORDER — LIDOCAINE 5 % EX OINT: TOPICAL_OINTMENT | CUTANEOUS | Status: DC

## 2015-04-17 NOTE — Patient Instructions (Signed)
Follow vulvar skin care directions (handout)  Use the lidocaine ointment and gabapentin cream as directed, if no improvement in 2 weeks call  Otherwise f/u in 1 month

## 2015-04-17 NOTE — Progress Notes (Signed)
GYNECOLOGY  VISIT   HPI: 74 y.o.   Married  Caucasian  female   G2P2002 with Patient's last menstrual period was 09/28/1979.   here for   Follow up - Vaginal atrophy. The patient was seen 2 weeks ago with vestibularl burning and was treated with topical estrogen for atrophy. It didn't help. The burning is almost constant, any pressure makes it burn.  She c/o dry mouth, extremly dry eyes, stuffy nose (doesn't run, feels dry and uncomfortable) she is wondering if this is connected to her vaginal symptoms and if she could have Sjogren syndrome.  GYNECOLOGIC HISTORY: Patient's last menstrual period was 09/28/1979. Contraception: Post menopausal  Menopausal hormone therapy: Estrace vaginal cream Last mammogram: 05/15/14 BIRADS2:benign  Last pap smear: 04/30/14 neg         OB History    Gravida Para Term Preterm AB TAB SAB Ectopic Multiple Living   2 2 2       2          Patient Active Problem List   Diagnosis Date Noted  . Hypothyroid   . Right rotator cuff tear 01/05/2013  . Impingement syndrome of right shoulder 01/05/2013  . 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 Biceps tendonitis 01/05/2013  . HTN (hypertension) 12/22/2011  . Hyperlipidemia 12/22/2011    Past Medical History  Diagnosis Date  . Chest pain   . Hypercholesterolemia   . Normal coronary arteries     by heart Cath  . MVP (mitral valve prolapse)     Hx of   . HTN (hypertension)     no meds  . Seasonal allergies   . IBS (irritable bowel syndrome)   . Anxiety   . Hypothyroidism   . Arthritis   . Right rotator cuff tear 01/05/2013  . Impingement syndrome of right shoulder 01/05/2013  . 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 Biceps tendonitis 01/05/2013  . Hypothyroid   . Osteopenia     Past Surgical History  Procedure Laterality Date  . Lasik    . Tonsillectomy    . Tubal ligation    . Cardiac catheterization  2004    which revealed smooth and normal coronary arteries  . Cosmetic surgery  2010    eyes-facial  .  Carpal tunnel release  2000    lt  . Dental surgery  1960  . Shoulder adhesion release  3/14    sedated for dislocation shoulder-  . Shoulder arthroscopy with rotator cuff repair Right 01/05/2013    Procedure: SHOULDER ARTHROSCOPY WITH ARTHROSCOPIC  ROTATOR CUFF REPAIR, ACROMIOPLASTY, EXTENSIVE DEBRIDEMENT;  Surgeon: Johnny Bridge, MD;  Location: [facility];  Service: Orthopedics;  Laterality: Right;    Current Outpatient Prescriptions  Medication Sig Dispense Refill  . acetaminophen (TYLENOL) 325 MG tablet Take 650 mg by mouth every 6 (six) hours as needed.    Marland Kitchen aspirin 81 MG tablet Take 81 mg by mouth at bedtime.     . Calcium Carbonate-Vitamin D (CALCIUM + D PO) Take by mouth daily.    . clonazePAM (KLONOPIN) 0.5 MG tablet Take 0.25 mg by mouth daily as needed for anxiety.    . cycloSPORINE (RESTASIS) 0.05 % ophthalmic emulsion Place 1 drop into both eyes every 12 (twelve) hours.     Marland Kitchen estradiol (ESTRACE) 0.1 MG/GM vaginal cream Apply a pea sized amount to the tender area every night for 2 weeks. 42.5 g 1  . levothyroxine (SYNTHROID, LEVOTHROID) 88 MCG tablet Take 88 mcg by mouth daily.    Marland Kitchen  LOTEMAX 0.5 % GEL INSTILL 1 DROP IN OU TID FOR 2 WEEKS  1  . Multiple Vitamins-Minerals (PRESERVISION AREDS PO) Take 1 tablet by mouth 2 (two) times daily.     . naproxen sodium (ANAPROX) 220 MG tablet Take 220 mg by mouth as needed.     Marland Kitchen Propylene Glycol (SYSTANE BALANCE OP) Apply to eye.    Mariane Baumgarten Sodium (COLACE PO) Take by mouth.     No current facility-administered medications for this visit.     ALLERGIES: Codeine; Crestor; Lipitor; Penicillins; and Sulfa antibiotics  Family History  Problem Relation Age of Onset  . Bladder Cancer Father   . Cancer Maternal Grandfather   . Mitral valve prolapse Mother     History   Social History  . Marital Status: Married    Spouse Name: N/A  . Number of Children: N/A  . Years of Education: N/A   Occupational History  .  Not on file.   Social History Main Topics  . Smoking status: Former Smoker    Quit date: 01/05/1960  . Smokeless tobacco: Never Used     Comment: Remote Hx  . Alcohol Use: Yes  . Drug Use: No  . Sexual Activity: No   Other Topics Concern  . Not on file   Social History Narrative    ROS:  Pertinent items are noted in HPI.  PHYSICAL EXAMINATION:    BP 126/60 mmHg  Pulse 76  Resp 18  Ht 5' 1.5" (1.562 m)  Wt 114 lb (51.71 kg)  BMI 21.19 kg/m2  LMP 09/28/1979    General appearance: alert, cooperative and appears stated age  Pelvic: External genitalia:  no lesions              Urethra:  normal appearing urethra with no masses, tenderness or lesions              Extreme tenderness of the vestibule with palpation of a cotton swab (with lubricating jelly)  Chaperone was present for exam.  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  20 minutes face to face time of which over 50% was spent in counseling.

## 2015-04-28 HISTORY — PX: ANTERIOR CERVICAL DECOMP/DISCECTOMY FUSION: SHX1161

## 2015-05-06 DIAGNOSIS — M5416 Radiculopathy, lumbar region: Secondary | ICD-10-CM | POA: Diagnosis not present

## 2015-05-06 DIAGNOSIS — M4806 Spinal stenosis, lumbar region: Secondary | ICD-10-CM | POA: Diagnosis not present

## 2015-05-16 ENCOUNTER — Ambulatory Visit: Payer: Medicare Other | Admitting: Obstetrics & Gynecology

## 2015-05-19 DIAGNOSIS — Z1231 Encounter for screening mammogram for malignant neoplasm of breast: Secondary | ICD-10-CM | POA: Diagnosis not present

## 2015-05-21 ENCOUNTER — Telehealth: Payer: Self-pay | Admitting: Obstetrics and Gynecology

## 2015-05-21 ENCOUNTER — Ambulatory Visit: Payer: Medicare Other | Admitting: Obstetrics and Gynecology

## 2015-05-21 NOTE — Telephone Encounter (Signed)
Patient called and cancelled her appointment with Dr. Sumner Boast today for a one month recheck due to "taking a family member for an MRI." She declined to reschedule at this time as she wants to make sure her family member is okay. She will call back to reschedule.

## 2015-05-30 DIAGNOSIS — M545 Low back pain: Secondary | ICD-10-CM | POA: Diagnosis not present

## 2015-05-30 DIAGNOSIS — M9973 Connective tissue and disc stenosis of intervertebral foramina of lumbar region: Secondary | ICD-10-CM | POA: Diagnosis not present

## 2015-05-30 DIAGNOSIS — M47812 Spondylosis without myelopathy or radiculopathy, cervical region: Secondary | ICD-10-CM | POA: Diagnosis not present

## 2015-05-30 DIAGNOSIS — M5020 Other cervical disc displacement, unspecified cervical region: Secondary | ICD-10-CM | POA: Diagnosis not present

## 2015-06-09 DIAGNOSIS — E039 Hypothyroidism, unspecified: Secondary | ICD-10-CM | POA: Insufficient documentation

## 2015-06-09 NOTE — Telephone Encounter (Signed)
Yes, we can close this encounter. She will call if she wants to be seen.

## 2015-06-09 NOTE — Telephone Encounter (Addendum)
Can this encounter be closed or is further follow up needed? The patient has not called back to reschedule.

## 2015-06-10 DIAGNOSIS — M5020 Other cervical disc displacement, unspecified cervical region: Secondary | ICD-10-CM | POA: Diagnosis not present

## 2015-06-10 DIAGNOSIS — R51 Headache: Secondary | ICD-10-CM | POA: Diagnosis present

## 2015-06-10 DIAGNOSIS — I10 Essential (primary) hypertension: Secondary | ICD-10-CM | POA: Diagnosis present

## 2015-06-10 DIAGNOSIS — M5002 Cervical disc disorder with myelopathy, mid-cervical region: Secondary | ICD-10-CM | POA: Diagnosis not present

## 2015-06-10 DIAGNOSIS — M549 Dorsalgia, unspecified: Secondary | ICD-10-CM | POA: Diagnosis present

## 2015-06-10 DIAGNOSIS — M79604 Pain in right leg: Secondary | ICD-10-CM | POA: Diagnosis present

## 2015-06-10 DIAGNOSIS — F419 Anxiety disorder, unspecified: Secondary | ICD-10-CM | POA: Diagnosis present

## 2015-06-10 DIAGNOSIS — Z87891 Personal history of nicotine dependence: Secondary | ICD-10-CM | POA: Diagnosis not present

## 2015-06-10 DIAGNOSIS — M5000 Cervical disc disorder with myelopathy, unspecified cervical region: Secondary | ICD-10-CM | POA: Diagnosis not present

## 2015-06-10 DIAGNOSIS — Z8261 Family history of arthritis: Secondary | ICD-10-CM | POA: Diagnosis not present

## 2015-06-10 DIAGNOSIS — M4802 Spinal stenosis, cervical region: Secondary | ICD-10-CM | POA: Diagnosis not present

## 2015-06-10 DIAGNOSIS — G8918 Other acute postprocedural pain: Secondary | ICD-10-CM | POA: Diagnosis not present

## 2015-06-10 DIAGNOSIS — R200 Anesthesia of skin: Secondary | ICD-10-CM | POA: Diagnosis present

## 2015-06-10 DIAGNOSIS — M1990 Unspecified osteoarthritis, unspecified site: Secondary | ICD-10-CM | POA: Diagnosis present

## 2015-06-10 DIAGNOSIS — R05 Cough: Secondary | ICD-10-CM | POA: Diagnosis present

## 2015-06-10 DIAGNOSIS — E784 Other hyperlipidemia: Secondary | ICD-10-CM | POA: Diagnosis present

## 2015-06-10 DIAGNOSIS — Z9851 Tubal ligation status: Secondary | ICD-10-CM | POA: Diagnosis not present

## 2015-06-10 DIAGNOSIS — M5022 Other cervical disc displacement, mid-cervical region: Secondary | ICD-10-CM | POA: Diagnosis not present

## 2015-06-10 DIAGNOSIS — M4312 Spondylolisthesis, cervical region: Secondary | ICD-10-CM | POA: Diagnosis not present

## 2015-06-10 DIAGNOSIS — K589 Irritable bowel syndrome without diarrhea: Secondary | ICD-10-CM | POA: Diagnosis present

## 2015-06-10 DIAGNOSIS — M79605 Pain in left leg: Secondary | ICD-10-CM | POA: Diagnosis present

## 2015-06-10 DIAGNOSIS — E039 Hypothyroidism, unspecified: Secondary | ICD-10-CM | POA: Diagnosis present

## 2015-06-10 DIAGNOSIS — M5012 Cervical disc disorder with radiculopathy, mid-cervical region: Secondary | ICD-10-CM | POA: Diagnosis present

## 2015-07-10 DIAGNOSIS — H25813 Combined forms of age-related cataract, bilateral: Secondary | ICD-10-CM | POA: Diagnosis not present

## 2015-07-11 DIAGNOSIS — Z23 Encounter for immunization: Secondary | ICD-10-CM | POA: Diagnosis not present

## 2015-07-23 DIAGNOSIS — M4806 Spinal stenosis, lumbar region: Secondary | ICD-10-CM | POA: Diagnosis not present

## 2015-07-23 DIAGNOSIS — M5416 Radiculopathy, lumbar region: Secondary | ICD-10-CM | POA: Diagnosis not present

## 2015-07-25 DIAGNOSIS — M4313 Spondylolisthesis, cervicothoracic region: Secondary | ICD-10-CM | POA: Diagnosis not present

## 2015-07-25 DIAGNOSIS — Z981 Arthrodesis status: Secondary | ICD-10-CM | POA: Diagnosis not present

## 2015-07-25 DIAGNOSIS — M5020 Other cervical disc displacement, unspecified cervical region: Secondary | ICD-10-CM | POA: Diagnosis not present

## 2015-08-18 DIAGNOSIS — H353110 Nonexudative age-related macular degeneration, right eye, stage unspecified: Secondary | ICD-10-CM | POA: Diagnosis not present

## 2015-08-18 DIAGNOSIS — H353130 Nonexudative age-related macular degeneration, bilateral, stage unspecified: Secondary | ICD-10-CM | POA: Diagnosis not present

## 2015-08-18 DIAGNOSIS — H25013 Cortical age-related cataract, bilateral: Secondary | ICD-10-CM | POA: Diagnosis not present

## 2015-08-18 DIAGNOSIS — H2511 Age-related nuclear cataract, right eye: Secondary | ICD-10-CM | POA: Diagnosis not present

## 2015-08-18 DIAGNOSIS — H353120 Nonexudative age-related macular degeneration, left eye, stage unspecified: Secondary | ICD-10-CM | POA: Diagnosis not present

## 2015-08-18 DIAGNOSIS — H16223 Keratoconjunctivitis sicca, not specified as Sjogren's, bilateral: Secondary | ICD-10-CM | POA: Diagnosis not present

## 2015-08-18 DIAGNOSIS — H2513 Age-related nuclear cataract, bilateral: Secondary | ICD-10-CM | POA: Diagnosis not present

## 2015-09-24 DIAGNOSIS — H25011 Cortical age-related cataract, right eye: Secondary | ICD-10-CM | POA: Diagnosis not present

## 2015-09-24 DIAGNOSIS — H2511 Age-related nuclear cataract, right eye: Secondary | ICD-10-CM | POA: Diagnosis not present

## 2015-09-24 DIAGNOSIS — H2512 Age-related nuclear cataract, left eye: Secondary | ICD-10-CM | POA: Diagnosis not present

## 2015-09-24 DIAGNOSIS — H25012 Cortical age-related cataract, left eye: Secondary | ICD-10-CM | POA: Diagnosis not present

## 2015-09-26 DIAGNOSIS — R0789 Other chest pain: Secondary | ICD-10-CM | POA: Diagnosis not present

## 2015-09-26 DIAGNOSIS — I10 Essential (primary) hypertension: Secondary | ICD-10-CM | POA: Diagnosis not present

## 2015-09-26 DIAGNOSIS — Z6821 Body mass index (BMI) 21.0-21.9, adult: Secondary | ICD-10-CM | POA: Diagnosis not present

## 2015-09-26 DIAGNOSIS — M1990 Unspecified osteoarthritis, unspecified site: Secondary | ICD-10-CM | POA: Diagnosis not present

## 2015-09-26 DIAGNOSIS — M546 Pain in thoracic spine: Secondary | ICD-10-CM | POA: Diagnosis not present

## 2015-09-28 HISTORY — PX: CATARACT EXTRACTION W/ INTRAOCULAR LENS  IMPLANT, BILATERAL: SHX1307

## 2015-10-07 DIAGNOSIS — H2511 Age-related nuclear cataract, right eye: Secondary | ICD-10-CM | POA: Diagnosis not present

## 2015-10-07 DIAGNOSIS — H25011 Cortical age-related cataract, right eye: Secondary | ICD-10-CM | POA: Diagnosis not present

## 2015-10-08 DIAGNOSIS — H2512 Age-related nuclear cataract, left eye: Secondary | ICD-10-CM | POA: Diagnosis not present

## 2015-10-08 DIAGNOSIS — H25012 Cortical age-related cataract, left eye: Secondary | ICD-10-CM | POA: Diagnosis not present

## 2015-10-09 DIAGNOSIS — I10 Essential (primary) hypertension: Secondary | ICD-10-CM | POA: Diagnosis not present

## 2015-10-09 DIAGNOSIS — M791 Myalgia: Secondary | ICD-10-CM | POA: Diagnosis not present

## 2015-10-09 DIAGNOSIS — E038 Other specified hypothyroidism: Secondary | ICD-10-CM | POA: Diagnosis not present

## 2015-10-09 DIAGNOSIS — Z6821 Body mass index (BMI) 21.0-21.9, adult: Secondary | ICD-10-CM | POA: Diagnosis not present

## 2015-10-09 DIAGNOSIS — J069 Acute upper respiratory infection, unspecified: Secondary | ICD-10-CM | POA: Diagnosis not present

## 2015-10-09 DIAGNOSIS — R0789 Other chest pain: Secondary | ICD-10-CM | POA: Diagnosis not present

## 2015-10-09 DIAGNOSIS — Z1389 Encounter for screening for other disorder: Secondary | ICD-10-CM | POA: Diagnosis not present

## 2015-10-21 DIAGNOSIS — H2512 Age-related nuclear cataract, left eye: Secondary | ICD-10-CM | POA: Diagnosis not present

## 2015-10-28 DIAGNOSIS — L570 Actinic keratosis: Secondary | ICD-10-CM | POA: Diagnosis not present

## 2015-10-28 DIAGNOSIS — L821 Other seborrheic keratosis: Secondary | ICD-10-CM | POA: Diagnosis not present

## 2015-12-09 DIAGNOSIS — Z6821 Body mass index (BMI) 21.0-21.9, adult: Secondary | ICD-10-CM | POA: Diagnosis not present

## 2015-12-09 DIAGNOSIS — K589 Irritable bowel syndrome without diarrhea: Secondary | ICD-10-CM | POA: Diagnosis not present

## 2015-12-09 DIAGNOSIS — R1013 Epigastric pain: Secondary | ICD-10-CM | POA: Diagnosis not present

## 2015-12-15 ENCOUNTER — Other Ambulatory Visit: Payer: Self-pay | Admitting: Internal Medicine

## 2015-12-15 DIAGNOSIS — R197 Diarrhea, unspecified: Secondary | ICD-10-CM

## 2015-12-17 ENCOUNTER — Ambulatory Visit
Admission: RE | Admit: 2015-12-17 | Discharge: 2015-12-17 | Disposition: A | Discharge status: Home / Self Care | Payer: Medicare Other | Source: Ambulatory Visit | Attending: Internal Medicine | Admitting: Internal Medicine

## 2015-12-17 DIAGNOSIS — R1084 Generalized abdominal pain: Secondary | ICD-10-CM | POA: Diagnosis not present

## 2015-12-17 DIAGNOSIS — R197 Diarrhea, unspecified: Secondary | ICD-10-CM

## 2016-01-12 DIAGNOSIS — H04123 Dry eye syndrome of bilateral lacrimal glands: Secondary | ICD-10-CM | POA: Diagnosis not present

## 2016-01-19 DIAGNOSIS — R1084 Generalized abdominal pain: Secondary | ICD-10-CM | POA: Diagnosis not present

## 2016-01-28 DIAGNOSIS — M4806 Spinal stenosis, lumbar region: Secondary | ICD-10-CM | POA: Diagnosis not present

## 2016-01-28 DIAGNOSIS — M5416 Radiculopathy, lumbar region: Secondary | ICD-10-CM | POA: Diagnosis not present

## 2016-01-30 DIAGNOSIS — Z6821 Body mass index (BMI) 21.0-21.9, adult: Secondary | ICD-10-CM | POA: Diagnosis not present

## 2016-01-30 DIAGNOSIS — K589 Irritable bowel syndrome without diarrhea: Secondary | ICD-10-CM | POA: Diagnosis not present

## 2016-01-30 DIAGNOSIS — I25119 Atherosclerotic heart disease of native coronary artery with unspecified angina pectoris: Secondary | ICD-10-CM | POA: Diagnosis not present

## 2016-01-30 DIAGNOSIS — R42 Dizziness and giddiness: Secondary | ICD-10-CM | POA: Diagnosis not present

## 2016-01-30 DIAGNOSIS — E784 Other hyperlipidemia: Secondary | ICD-10-CM | POA: Diagnosis not present

## 2016-01-30 DIAGNOSIS — I10 Essential (primary) hypertension: Secondary | ICD-10-CM | POA: Diagnosis not present

## 2016-01-30 DIAGNOSIS — F39 Unspecified mood [affective] disorder: Secondary | ICD-10-CM | POA: Diagnosis not present

## 2016-02-11 DIAGNOSIS — M4802 Spinal stenosis, cervical region: Secondary | ICD-10-CM | POA: Diagnosis not present

## 2016-02-11 DIAGNOSIS — R51 Headache: Secondary | ICD-10-CM | POA: Diagnosis not present

## 2016-02-11 DIAGNOSIS — M542 Cervicalgia: Secondary | ICD-10-CM | POA: Diagnosis not present

## 2016-02-18 DIAGNOSIS — L570 Actinic keratosis: Secondary | ICD-10-CM | POA: Diagnosis not present

## 2016-02-18 DIAGNOSIS — L309 Dermatitis, unspecified: Secondary | ICD-10-CM | POA: Diagnosis not present

## 2016-02-20 ENCOUNTER — Ambulatory Visit (INDEPENDENT_AMBULATORY_CARE_PROVIDER_SITE_OTHER): Payer: Medicare Other | Admitting: Cardiovascular Disease

## 2016-02-20 ENCOUNTER — Other Ambulatory Visit: Payer: Self-pay | Admitting: Surgical

## 2016-02-20 ENCOUNTER — Encounter: Payer: Self-pay | Admitting: Cardiovascular Disease

## 2016-02-20 VITALS — BP 150/80 | HR 62 | Ht 61.5 in | Wt 114.0 lb

## 2016-02-20 DIAGNOSIS — I2510 Atherosclerotic heart disease of native coronary artery without angina pectoris: Secondary | ICD-10-CM

## 2016-02-20 DIAGNOSIS — R519 Headache, unspecified: Secondary | ICD-10-CM

## 2016-02-20 DIAGNOSIS — I2584 Coronary atherosclerosis due to calcified coronary lesion: Secondary | ICD-10-CM | POA: Diagnosis not present

## 2016-02-20 DIAGNOSIS — I10 Essential (primary) hypertension: Secondary | ICD-10-CM | POA: Diagnosis not present

## 2016-02-20 DIAGNOSIS — E785 Hyperlipidemia, unspecified: Secondary | ICD-10-CM

## 2016-02-20 DIAGNOSIS — R51 Headache: Secondary | ICD-10-CM

## 2016-02-20 DIAGNOSIS — M542 Cervicalgia: Secondary | ICD-10-CM

## 2016-02-20 MED ORDER — ATORVASTATIN CALCIUM 20 MG PO TABS: 20.0000 mg | ORAL_TABLET | Freq: Every day | ORAL | Status: DC

## 2016-02-20 NOTE — Progress Notes (Signed)
Bianca Garcia Date of Birth  1940-12-12       [address]    [place]     [place], [place]  [id number]      Problem list: 1. History of chest pain-normal coronary artery heart catheterization Had coronary artery calcifications by CT scan   2. Hyperlipidemia 3. Hypertension 4. Hypothyroidism  History of Present Illness:  Pt is doing well from a cardiac standpoint.  She continues to have anxiety.  She takes Klonipin on occasion which helps.  She has not had any cardiac problems.  Feb 20, 2016:  Bianca Garcia was seen by [facility] of abdominal issues.   Got a CT scan  Was noted to incidentally found to have right coronary artery calcifications.   Does have some mild chest pressure like sensation with working outside, especially if on an incline.  Records from [facility] were reviewed.   Chol = 247 Trigs = 89 HDL = 71 LDL 158.   Is having lots of dizziness with the Buspar.    Current Outpatient Prescriptions  Medication Sig Dispense Refill  . acetaminophen (TYLENOL) 325 MG tablet Take 650 mg by mouth every 6 (six) hours as needed.    Marland Kitchen aspirin 81 MG tablet Take 81 mg by mouth at bedtime.     . busPIRone (BUSPAR) 5 MG tablet Take 5 mg by mouth 2 (two) times daily.  2  . Calcium Carbonate-Vitamin D (CALCIUM + D PO) Take by mouth daily.    . cycloSPORINE (RESTASIS) 0.05 % ophthalmic emulsion Place 1 drop into both eyes every 12 (twelve) hours.     Mariane Baumgarten Sodium (COLACE PO) Take 1 tablet by mouth daily as needed (as needed for constipation).     Marland Kitchen levothyroxine (SYNTHROID, LEVOTHROID) 88 MCG tablet Take 88 mcg by mouth daily.    . Multiple Vitamins-Minerals (PRESERVISION AREDS PO) Take 1 tablet by mouth 2 (two) times daily.     Marland Kitchen Propylene Glycol (SYSTANE BALANCE OP) Apply 1 drop to eye daily.      No current facility-administered medications for this visit.   Family History  Problem Relation Age of Onset  . Bladder Cancer Father   . Cancer  Maternal Grandfather   . Mitral valve prolapse Mother    Smoked briefly as a teenager.   Allergies  Allergen Reactions  . Fluticasone Other (See Comments)    DRY EYES  . Codeine   . Crestor [Rosuvastatin Calcium]     Intolerant   . Lipitor [Atorvastatin Calcium]     Intolerant   . Penicillins   . Sulfa Antibiotics     vomiting    Past Medical History  Diagnosis Date  . Chest pain   . Hypercholesterolemia   . Normal coronary arteries     by heart Cath  . MVP (mitral valve prolapse)     Hx of   . HTN (hypertension)     no meds  . Seasonal allergies   . IBS (irritable bowel syndrome)   . Anxiety   . Hypothyroidism   . Arthritis   . Right rotator cuff tear 01/05/2013  . Impingement syndrome of right shoulder 01/05/2013  . 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 Biceps tendonitis 01/05/2013  . Hypothyroid   . Osteopenia     Past Surgical History  Procedure Laterality Date  . Lasik    . Tonsillectomy    . Tubal ligation    . Cardiac catheterization  2004  which revealed smooth and normal coronary arteries  . Cosmetic surgery  2010    eyes-facial  . Carpal tunnel release  2000    lt  . Dental surgery  1960  . Shoulder adhesion release  3/14    sedated for dislocation shoulder-  . Shoulder arthroscopy with rotator cuff repair Right 01/05/2013    Procedure: SHOULDER ARTHROSCOPY WITH ARTHROSCOPIC  ROTATOR CUFF REPAIR, ACROMIOPLASTY, EXTENSIVE DEBRIDEMENT;  Surgeon: Johnny Bridge, MD;  Location: [facility];  Service: Orthopedics;  Laterality: Right;    History  Smoking status  . Former Smoker  . Quit date: 01/05/1960  Smokeless tobacco  . Never Used    Comment: Remote Hx    History  Alcohol Use  . Yes    Family History  Problem Relation Age of Onset  . Bladder Cancer Father   . Cancer Maternal Grandfather   . Mitral valve prolapse Mother     Reviw of Systems:  Reviewed in the HPI.  All other systems are negative.  Physical  Exam: Blood pressure 150/80, pulse 62, height 5' 1.5" (1.562 m), weight 114 lb (51.71 kg), last menstrual period 09/28/1979. General: Well developed, well nourished, in no acute distress.  Head: Normocephalic, atraumatic, sclera non-icteric, mucus membranes are moist,   Neck: Supple. Carotids are 2 + without bruits. No JVD  Lungs: Clear bilaterally to auscultation.  Heart: regular rate.  normal  S1 S2. No murmurs, gallops or rubs.  Abdomen: Soft, non-tender, non-distended with normal bowel sounds. No hepatomegaly. No rebound/guarding. No masses.  Msk:  Strength and tone are normal  Extremities: No clubbing or cyanosis. No edema.  Distal pedal pulses are 2+ and equal bilaterally.  Neuro: Alert and oriented X 3. Moves all extremities spontaneously.  Psych:  Responds to questions appropriately with a normal affect.  ECG: Feb 20, 2016:  NSR at 44.    Normal ECG  Assessment / Plan:   1. Coronary artery calcification :   Had a normal cath 10-15 years ago  She has some CP with exertion and has hyperlipidemia Will get a [employer] for further evaluation   2. Hyperlipidemia:    Chol = 247 Trigs = 89 HDL = 71 LDL 158.   will start Atorvastatin 20 a day  Check lipids and cmet in 3 months .     [name], MD  02/20/2016 12:19 PM    [address],  [place], [place]  [address] Pager [phone] Phone: [phone]; Fax: [phone]

## 2016-02-20 NOTE — Patient Instructions (Addendum)
Medication Instructions:  START Atorvastatin 20 mg once daily   Labwork: Your physician recommends that you return for lab work in: 3 months on the day of or a few days before your office visit with Dr. Acie Fredrickson.  You will need to FAST for this appointment - nothing to eat or drink after midnight the night before except water.   Testing/Procedures: Your physician has requested that you have a lexiscan myoview. For further information please visit [email or web address]. Please follow instruction sheet, as given.    Follow-Up: Your physician recommends that you schedule a follow-up appointment in: 3 months with Dr. Acie Fredrickson   If you need a refill on your cardiac medications before your next appointment, please call your pharmacy.   Thank you for choosing [facility]! Christen Bame, RN [phone]

## 2016-02-20 NOTE — Addendum Note (Signed)
Addended by: Thayer Headings on: 02/20/2016 12:40 PM   Modules accepted: Miquel Dunn

## 2016-02-27 ENCOUNTER — Ambulatory Visit (INDEPENDENT_AMBULATORY_CARE_PROVIDER_SITE_OTHER): Payer: Medicare Other | Admitting: Obstetrics & Gynecology

## 2016-02-27 ENCOUNTER — Encounter: Payer: Self-pay | Admitting: Obstetrics & Gynecology

## 2016-02-27 VITALS — BP 120/60 | HR 66 | Resp 16 | Ht 60.5 in | Wt 113.6 lb

## 2016-02-27 DIAGNOSIS — Z01419 Encounter for gynecological examination (general) (routine) without abnormal findings: Secondary | ICD-10-CM

## 2016-02-27 DIAGNOSIS — K589 Irritable bowel syndrome without diarrhea: Secondary | ICD-10-CM | POA: Insufficient documentation

## 2016-02-27 DIAGNOSIS — Z124 Encounter for screening for malignant neoplasm of cervix: Secondary | ICD-10-CM | POA: Diagnosis not present

## 2016-02-27 DIAGNOSIS — F419 Anxiety disorder, unspecified: Secondary | ICD-10-CM | POA: Insufficient documentation

## 2016-02-27 NOTE — Progress Notes (Signed)
75 y.o. [id number] MarriedCaucasianF here for annual exam.  Denies vaginal bleeding.  Has experienced about a year's worth of LLQ pain that is sharp and mostly when lying down.  Movement will make it better.  No changes in bowel function which are soft.  She does have 10-12 BM's a day.  This is not a change for her.    Pt reports she's been having stomach pain issues for several months.  Saw Dr. Dagmar Hait and had a CT.  This showed atherosclerosis of the right coronary artery.  CT was otherwise completely negative.  Colonoscopy is up to date with Dr. Watt Climes.    From heart standpoint.  Has now seen Dr. Cathie Olden.  Has stress test scheduled and Lexiscan myoview scheduled as well.  She has started on Lipitor.  Has switched off the Klonopin and is now on buspar.  Reports this hasn't helped as much.  Is ok to stay on it but thinks the Klonopin was "better".  PCP:  Dr. Dagmar Hait.  Blood work was all good and this was a few months ago.  Patient's last menstrual period was 09/28/1979.          Sexually active: No.  The current method of family planning is none.    Exercising: Yes.    walking Smoker:  no  Health Maintenance: Pap:  04/30/2014 negative  History of abnormal Pap:  no MMG:  05/19/2015 BIRADS 1 negative  Colonoscopy:  Pt states <5 years BMD:   2011 osteopenia  TDaP:  declined Pneumonia vaccine(s):  Pt states current  Zostavax:   Pt states "a long time ago" Hep C testing: screens out  Screening Labs: PCP, Hb today: PCP, Urine today: PCP   reports that she quit smoking about 56 years ago. She has never used smokeless tobacco. She reports that she drinks alcohol. She reports that she does not use illicit drugs.  Past Medical History  Diagnosis Date  . Chest pain   . Hypercholesterolemia   . Normal coronary arteries     by heart Cath  . MVP (mitral valve prolapse)     Hx of   . HTN (hypertension)     no meds  . Seasonal allergies   . IBS (irritable bowel syndrome)   . Anxiety   .  Hypothyroidism   . Arthritis   . Right rotator cuff tear 01/05/2013  . Impingement syndrome of right shoulder 01/05/2013  . 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 Biceps tendonitis 01/05/2013  . Hypothyroid   . Osteopenia     Past Surgical History  Procedure Laterality Date  . Lasik    . Tonsillectomy    . Tubal ligation    . Cardiac catheterization  2004    which revealed smooth and normal coronary arteries  . Cosmetic surgery  2010    eyes-facial  . Carpal tunnel release  2000    lt  . Dental surgery  1960  . Shoulder adhesion release  3/14    sedated for dislocation shoulder-  . Shoulder arthroscopy with rotator cuff repair Right 01/05/2013    Procedure: SHOULDER ARTHROSCOPY WITH ARTHROSCOPIC  ROTATOR CUFF REPAIR, ACROMIOPLASTY, EXTENSIVE DEBRIDEMENT;  Surgeon: Johnny Bridge, MD;  Location: [facility];  Service: Orthopedics;  Laterality: Right;    Current Outpatient Prescriptions  Medication Sig Dispense Refill  . acetaminophen (TYLENOL) 325 MG tablet Take 650 mg by mouth every 6 (six) hours as needed.    Marland Kitchen aspirin 81 MG tablet Take 81 mg  by mouth at bedtime.     Marland Kitchen atorvastatin (LIPITOR) 20 MG tablet Take 1 tablet (20 mg total) by mouth daily. 30 tablet 11  . busPIRone (BUSPAR) 5 MG tablet Take 5 mg by mouth 2 (two) times daily.  2  . Calcium Carbonate-Vitamin D (CALCIUM + D PO) Take by mouth daily.    . cycloSPORINE (RESTASIS) 0.05 % ophthalmic emulsion Place 1 drop into both eyes every 12 (twelve) hours.     Mariane Baumgarten Sodium (COLACE PO) Take 1 tablet by mouth daily as needed (as needed for constipation).     Marland Kitchen levothyroxine (SYNTHROID, LEVOTHROID) 88 MCG tablet Take 88 mcg by mouth daily.    . Multiple Vitamins-Minerals (PRESERVISION AREDS PO) Take 1 tablet by mouth 2 (two) times daily.     Marland Kitchen Propylene Glycol (SYSTANE BALANCE OP) Apply 1 drop to eye daily.      No current facility-administered medications for this visit.    Family History  Problem Relation  Age of Onset  . Bladder Cancer Father   . Cancer Maternal Grandfather   . Mitral valve prolapse Mother     ROS:  Pertinent items are noted in HPI.  Otherwise, a comprehensive ROS was negative.  Exam:   Filed Vitals:   02/27/16 1034  BP: 120/60  Pulse: 66  Resp: 16  Height: 5' 0.5" (1.537 m)  Weight: 113 lb 9.6 oz (51.529 kg)    General appearance: alert, cooperative and appears stated age Head: Normocephalic, without obvious abnormality, atraumatic Neck: no adenopathy, supple, symmetrical, trachea midline and thyroid normal to inspection and palpation Lungs: clear to auscultation bilaterally Breasts: normal appearance, no masses or tenderness Heart: regular rate and rhythm Abdomen: soft, non-tender; bowel sounds normal; no masses,  no organomegaly Extremities: extremities normal, atraumatic, no cyanosis or edema Skin: Skin color, texture, turgor normal. No rashes or lesions Lymph nodes: Cervical, supraclavicular, and axillary nodes normal. No abnormal inguinal nodes palpated Neurologic: Grossly normal   Pelvic: External genitalia:  no lesions              Urethra:  normal appearing urethra with no masses, tenderness or lesions              Bartholins and Skenes: normal                 Vagina: normal appearing vagina with normal color and discharge, no lesions              Cervix: no lesions              Pap taken: Yes.   Bimanual Exam:  Uterus:  normal size, contour, position, consistency, mobility, non-tender              Adnexa: normal adnexa               Rectovaginal: Confirms               Anus:  normal sphincter tone, no lesions  Chaperone was present for exam.  A:  Well Woman with normal exam  PMP, No HRT  Long hx of IBS with bowel movement changes this past year.  Evaluation included a CT of abdomen Atherosclerosis of abdominal aorta, now being followed by Dr. Cathie Olden Hypothyroidism  Osteopenia   P: Mammogram yearly recommended.  pap smear every  other year. Patient and I have discussed guidelines and she feels more comfortable with every other year screening. Pap today. Labs with Dr. Dagmar Hait every year. Vaccines with  Dr. Dagmar Hait as well. return annually or prn

## 2016-03-01 LAB — IPS PAP SMEAR ONLY

## 2016-03-02 ENCOUNTER — Telehealth (HOSPITAL_COMMUNITY): Payer: Self-pay | Admitting: *Deleted

## 2016-03-02 NOTE — Telephone Encounter (Signed)
Left message on voicemail in reference to upcoming appointment scheduled for 03/05/16. Phone number given for a call back so details instructions can be given. Mcdaniel Ohms, Ranae Palms

## 2016-03-03 ENCOUNTER — Ambulatory Visit
Admission: RE | Admit: 2016-03-03 | Discharge: 2016-03-03 | Disposition: A | Discharge status: Home / Self Care | Payer: Medicare Other | Source: Ambulatory Visit | Attending: Surgical | Admitting: Surgical

## 2016-03-03 DIAGNOSIS — M542 Cervicalgia: Secondary | ICD-10-CM

## 2016-03-03 DIAGNOSIS — R519 Headache, unspecified: Secondary | ICD-10-CM

## 2016-03-03 DIAGNOSIS — M50221 Other cervical disc displacement at C4-C5 level: Secondary | ICD-10-CM | POA: Diagnosis not present

## 2016-03-03 DIAGNOSIS — R51 Headache: Secondary | ICD-10-CM

## 2016-03-05 ENCOUNTER — Ambulatory Visit (HOSPITAL_COMMUNITY): Payer: Medicare Other | Attending: Cardiology

## 2016-03-05 DIAGNOSIS — I2510 Atherosclerotic heart disease of native coronary artery without angina pectoris: Secondary | ICD-10-CM | POA: Diagnosis not present

## 2016-03-05 DIAGNOSIS — I10 Essential (primary) hypertension: Secondary | ICD-10-CM | POA: Diagnosis not present

## 2016-03-05 DIAGNOSIS — R079 Chest pain, unspecified: Secondary | ICD-10-CM | POA: Insufficient documentation

## 2016-03-05 DIAGNOSIS — I2584 Coronary atherosclerosis due to calcified coronary lesion: Secondary | ICD-10-CM | POA: Diagnosis not present

## 2016-03-05 LAB — MYOCARDIAL PERFUSION IMAGING
LV dias vol: 79 mL (ref 46–106)
LV sys vol: 30 mL
Peak HR: 96 {beats}/min
RATE: 0.27
Rest HR: 60 {beats}/min
SDS: 2
SRS: 0
SSS: 2
TID: 1.06

## 2016-03-05 MED ORDER — TECHNETIUM TC 99M TETROFOSMIN IV KIT
9.6000 | PACK | Freq: Once | INTRAVENOUS | Status: AC | PRN
  Administered 2016-03-05: 10 via INTRAVENOUS
  Filled 2016-03-05: qty 10

## 2016-03-05 MED ORDER — REGADENOSON 0.4 MG/5ML IV SOLN
0.4000 mg | Freq: Once | INTRAVENOUS | Status: AC
  Administered 2016-03-05: 0.4 mg via INTRAVENOUS

## 2016-03-05 MED ORDER — TECHNETIUM TC 99M TETROFOSMIN IV KIT
32.5000 | PACK | Freq: Once | INTRAVENOUS | Status: AC | PRN
  Administered 2016-03-05: 33 via INTRAVENOUS
  Filled 2016-03-05: qty 33

## 2016-03-09 ENCOUNTER — Telehealth: Payer: Self-pay | Admitting: Cardiovascular Disease

## 2016-03-09 DIAGNOSIS — M4806 Spinal stenosis, lumbar region: Secondary | ICD-10-CM | POA: Diagnosis not present

## 2016-03-09 NOTE — Telephone Encounter (Signed)
Notes Recorded by Thayer Headings, MD on 03/05/2016 at 2:07 PM Normal myoview   Notified the pt that per Dr Acie Fredrickson, her Bianca Garcia was normal.  Pt verbalized understanding.

## 2016-03-09 NOTE — Telephone Encounter (Signed)
New message     Patient calling - nuclear stress test done on  6/9 - test results.

## 2016-03-22 DIAGNOSIS — M5431 Sciatica, right side: Secondary | ICD-10-CM | POA: Diagnosis not present

## 2016-03-22 DIAGNOSIS — F39 Unspecified mood [affective] disorder: Secondary | ICD-10-CM | POA: Diagnosis not present

## 2016-03-22 DIAGNOSIS — M5416 Radiculopathy, lumbar region: Secondary | ICD-10-CM | POA: Diagnosis not present

## 2016-03-22 DIAGNOSIS — K219 Gastro-esophageal reflux disease without esophagitis: Secondary | ICD-10-CM | POA: Diagnosis not present

## 2016-03-22 DIAGNOSIS — Z6821 Body mass index (BMI) 21.0-21.9, adult: Secondary | ICD-10-CM | POA: Diagnosis not present

## 2016-03-24 DIAGNOSIS — R262 Difficulty in walking, not elsewhere classified: Secondary | ICD-10-CM | POA: Diagnosis not present

## 2016-03-24 DIAGNOSIS — M5416 Radiculopathy, lumbar region: Secondary | ICD-10-CM | POA: Diagnosis not present

## 2016-03-24 DIAGNOSIS — M4806 Spinal stenosis, lumbar region: Secondary | ICD-10-CM | POA: Diagnosis not present

## 2016-03-24 DIAGNOSIS — R1084 Generalized abdominal pain: Secondary | ICD-10-CM | POA: Diagnosis not present

## 2016-03-29 DIAGNOSIS — M5416 Radiculopathy, lumbar region: Secondary | ICD-10-CM | POA: Diagnosis not present

## 2016-03-31 DIAGNOSIS — M5416 Radiculopathy, lumbar region: Secondary | ICD-10-CM | POA: Diagnosis not present

## 2016-03-31 DIAGNOSIS — R262 Difficulty in walking, not elsewhere classified: Secondary | ICD-10-CM | POA: Diagnosis not present

## 2016-03-31 DIAGNOSIS — M4806 Spinal stenosis, lumbar region: Secondary | ICD-10-CM | POA: Diagnosis not present

## 2016-04-02 DIAGNOSIS — M4806 Spinal stenosis, lumbar region: Secondary | ICD-10-CM | POA: Diagnosis not present

## 2016-04-02 DIAGNOSIS — R262 Difficulty in walking, not elsewhere classified: Secondary | ICD-10-CM | POA: Diagnosis not present

## 2016-04-02 DIAGNOSIS — M5416 Radiculopathy, lumbar region: Secondary | ICD-10-CM | POA: Diagnosis not present

## 2016-04-05 DIAGNOSIS — M4806 Spinal stenosis, lumbar region: Secondary | ICD-10-CM | POA: Diagnosis not present

## 2016-04-05 DIAGNOSIS — R262 Difficulty in walking, not elsewhere classified: Secondary | ICD-10-CM | POA: Diagnosis not present

## 2016-04-05 DIAGNOSIS — M5416 Radiculopathy, lumbar region: Secondary | ICD-10-CM | POA: Diagnosis not present

## 2016-04-09 DIAGNOSIS — M5416 Radiculopathy, lumbar region: Secondary | ICD-10-CM | POA: Diagnosis not present

## 2016-04-09 DIAGNOSIS — M4806 Spinal stenosis, lumbar region: Secondary | ICD-10-CM | POA: Diagnosis not present

## 2016-04-09 DIAGNOSIS — R262 Difficulty in walking, not elsewhere classified: Secondary | ICD-10-CM | POA: Diagnosis not present

## 2016-04-12 DIAGNOSIS — R262 Difficulty in walking, not elsewhere classified: Secondary | ICD-10-CM | POA: Diagnosis not present

## 2016-04-12 DIAGNOSIS — H16223 Keratoconjunctivitis sicca, not specified as Sjogren's, bilateral: Secondary | ICD-10-CM | POA: Diagnosis not present

## 2016-04-12 DIAGNOSIS — M4806 Spinal stenosis, lumbar region: Secondary | ICD-10-CM | POA: Diagnosis not present

## 2016-04-12 DIAGNOSIS — M5416 Radiculopathy, lumbar region: Secondary | ICD-10-CM | POA: Diagnosis not present

## 2016-04-14 DIAGNOSIS — H6123 Impacted cerumen, bilateral: Secondary | ICD-10-CM | POA: Diagnosis not present

## 2016-04-16 DIAGNOSIS — M5416 Radiculopathy, lumbar region: Secondary | ICD-10-CM | POA: Diagnosis not present

## 2016-04-16 DIAGNOSIS — R262 Difficulty in walking, not elsewhere classified: Secondary | ICD-10-CM | POA: Diagnosis not present

## 2016-04-16 DIAGNOSIS — M4806 Spinal stenosis, lumbar region: Secondary | ICD-10-CM | POA: Diagnosis not present

## 2016-04-20 DIAGNOSIS — M5416 Radiculopathy, lumbar region: Secondary | ICD-10-CM | POA: Diagnosis not present

## 2016-05-12 ENCOUNTER — Encounter: Payer: Self-pay | Admitting: Cardiovascular Disease

## 2016-05-19 DIAGNOSIS — Z1231 Encounter for screening mammogram for malignant neoplasm of breast: Secondary | ICD-10-CM | POA: Diagnosis not present

## 2016-05-25 DIAGNOSIS — L918 Other hypertrophic disorders of the skin: Secondary | ICD-10-CM | POA: Diagnosis not present

## 2016-05-25 DIAGNOSIS — L821 Other seborrheic keratosis: Secondary | ICD-10-CM | POA: Diagnosis not present

## 2016-05-25 DIAGNOSIS — L661 Lichen planopilaris: Secondary | ICD-10-CM | POA: Diagnosis not present

## 2016-05-25 DIAGNOSIS — I788 Other diseases of capillaries: Secondary | ICD-10-CM | POA: Diagnosis not present

## 2016-06-01 ENCOUNTER — Encounter: Payer: Self-pay | Admitting: Obstetrics & Gynecology

## 2016-06-03 ENCOUNTER — Other Ambulatory Visit: Payer: Medicare Other | Admitting: *Deleted

## 2016-06-03 DIAGNOSIS — I2510 Atherosclerotic heart disease of native coronary artery without angina pectoris: Secondary | ICD-10-CM

## 2016-06-03 DIAGNOSIS — I10 Essential (primary) hypertension: Secondary | ICD-10-CM | POA: Diagnosis not present

## 2016-06-03 DIAGNOSIS — I2584 Coronary atherosclerosis due to calcified coronary lesion: Secondary | ICD-10-CM

## 2016-06-03 DIAGNOSIS — E785 Hyperlipidemia, unspecified: Secondary | ICD-10-CM

## 2016-06-03 LAB — COMPREHENSIVE METABOLIC PANEL
ALT: 17 U/L (ref 6–29)
AST: 19 U/L (ref 10–35)
Albumin: 4.1 g/dL (ref 3.6–5.1)
Alkaline Phosphatase: 72 U/L (ref 33–130)
BUN: 15 mg/dL (ref 7–25)
CO2: 27 mmol/L (ref 20–31)
Calcium: 9.3 mg/dL (ref 8.6–10.4)
Chloride: 104 mmol/L (ref 98–110)
Creat: 0.72 mg/dL (ref 0.60–0.93)
Glucose, Bld: 81 mg/dL (ref 65–99)
Potassium: 4.3 mmol/L (ref 3.5–5.3)
Sodium: 138 mmol/L (ref 135–146)
Total Bilirubin: 0.5 mg/dL (ref 0.2–1.2)
Total Protein: 6.6 g/dL (ref 6.1–8.1)

## 2016-06-03 LAB — LIPID PANEL
Cholesterol: 168 mg/dL (ref 125–200)
HDL: 86 mg/dL (ref >=46)
LDL Cholesterol: 67 mg/dL (ref <130)
Total CHOL/HDL Ratio: 2 Ratio (ref <=5.0)
Triglycerides: 77 mg/dL (ref <150)
VLDL: 15 mg/dL (ref <30)

## 2016-06-07 ENCOUNTER — Ambulatory Visit: Payer: Medicare Other | Admitting: Cardiovascular Disease

## 2016-06-07 DIAGNOSIS — M47812 Spondylosis without myelopathy or radiculopathy, cervical region: Secondary | ICD-10-CM | POA: Diagnosis not present

## 2016-06-07 DIAGNOSIS — M5416 Radiculopathy, lumbar region: Secondary | ICD-10-CM | POA: Diagnosis not present

## 2016-06-08 ENCOUNTER — Telehealth: Payer: Self-pay | Admitting: Cardiovascular Disease

## 2016-06-08 MED ORDER — AMLODIPINE BESYLATE 10 MG PO TABS: 10.0000 mg | ORAL_TABLET | Freq: Every day | ORAL | 6 refills | Status: DC

## 2016-06-08 NOTE — Telephone Encounter (Signed)
Called patient about her BP. Patient complaining of some upper chest tightness, patient stated it is from her shoulder to shoulder. Patient stated this is not chest pain. Patient's last readings of her BP 170/100, 182/99, and 188/99. Patient has an appointment with her PCP tomorrow morning. Informed patient that her BP is high. Informed patient  I would consult a doctor in the office and call her back. Consulted Dr. Johnsie Cancel, due to the time of the call, he recommends patient to take Norvasc 10 mg by mouth daily, starting tonight. Called patient with Dr. Kyla Balzarine advise and sent prescription into her pharmacy of choice. Informed patient if her symptoms do not get better or worse with the new medication to go to Urgent Care or ED. Patient has an appointment with Dr. Acie Fredrickson in October. Will forward to Dr. Acie Fredrickson for further advisement.

## 2016-06-08 NOTE — Telephone Encounter (Signed)
New Message  Pt husband called requesting to speak with RN  Pt c/o BP issue: STAT if pt c/o blurred vision, one-sided weakness or slurred speech  1. What are your last 5 BP readings? Per pt took one reading 188/99 pulse 72.. Pt husbands states he tool another reading but does not remember the exact reading he stated the reading was the almost the same. The bottom number was lower.   2. Are you having any other symptoms (ex. Dizziness, headache, blurred vision, passed out)? Per pt husband pt states chest tightness.  3. What is your BP issue? Pt husband called because the high bp scared her and would like further instructions on what to do next. Please call back to discuss

## 2016-06-09 DIAGNOSIS — I10 Essential (primary) hypertension: Secondary | ICD-10-CM | POA: Diagnosis not present

## 2016-06-09 DIAGNOSIS — R42 Dizziness and giddiness: Secondary | ICD-10-CM | POA: Diagnosis not present

## 2016-06-09 DIAGNOSIS — K219 Gastro-esophageal reflux disease without esophagitis: Secondary | ICD-10-CM | POA: Diagnosis not present

## 2016-06-09 DIAGNOSIS — F349 Persistent mood [affective] disorder, unspecified: Secondary | ICD-10-CM | POA: Diagnosis not present

## 2016-06-09 DIAGNOSIS — R0789 Other chest pain: Secondary | ICD-10-CM | POA: Diagnosis not present

## 2016-06-09 DIAGNOSIS — R6889 Other general symptoms and signs: Secondary | ICD-10-CM | POA: Diagnosis not present

## 2016-06-09 NOTE — Telephone Encounter (Signed)
I called Bianca Garcia. She is feeling much better. Chest tightness and headache are gone. I cautioned her that 10 mg of Amlodipine was a moderte - large dose . She should take her BP daily and call us if the BP drops below 100 of if she develops symptoms of orthostasis.

## 2016-06-09 NOTE — Telephone Encounter (Signed)
See Dr. Elmarie Shiley note

## 2016-06-10 ENCOUNTER — Telehealth: Payer: Self-pay | Admitting: Cardiovascular Disease

## 2016-06-10 NOTE — Telephone Encounter (Signed)
Spoke with patient and advised that BP readings she sent in look great, continue current medications.  She states tightness in chest resolved after taking first pill of amlodipine and has not returned.  She states she feels well.  I advised her to continue to monitor BP and send in readings next week.  I advised that if BP gets low (approximately 123XX123 mmHg systolic, [id number] mmHg diastolic) to decrease the amlodipine to 5 mg and to notify us.  She is scheduled for follow-up with Dr. Acie Fredrickson 10/18 and will call back with questions or concerns prior to that appointment.  She thanked me for the call.

## 2016-06-10 NOTE — Telephone Encounter (Signed)
New Message  Pt c/o BP issue: STAT if pt c/o blurred vision, one-sided weakness or slurred speech  1. What are your last 5 BP readings? 9.14.17-(1050 am 123/82, 232 pm 125/78)  2. Are you having any other symptoms (ex. Dizziness, headache, blurred vision, passed out)? No  3. What is your BP issue? Pt voiced MD Nahser informed pt to call today and tomorrow to submit BP readings.

## 2016-06-16 DIAGNOSIS — M47812 Spondylosis without myelopathy or radiculopathy, cervical region: Secondary | ICD-10-CM | POA: Diagnosis not present

## 2016-06-19 DIAGNOSIS — Z23 Encounter for immunization: Secondary | ICD-10-CM | POA: Diagnosis not present

## 2016-06-22 DIAGNOSIS — L661 Lichen planopilaris: Secondary | ICD-10-CM | POA: Diagnosis not present

## 2016-06-30 DIAGNOSIS — M47816 Spondylosis without myelopathy or radiculopathy, lumbar region: Secondary | ICD-10-CM | POA: Diagnosis not present

## 2016-07-02 ENCOUNTER — Telehealth: Payer: Self-pay | Admitting: Cardiovascular Disease

## 2016-07-02 NOTE — Telephone Encounter (Signed)
Overall these look good.

## 2016-07-02 NOTE — Telephone Encounter (Signed)
Ms.Capps is calling because she was instructed to do so by Dr.Nahser to give blood pressure reading... Sept 11 170/90 Sept 16 110/62 Sept 17 123/74 Error 179/100 took it again it was 182/89 Few days later 130/72 Today 119/76

## 2016-07-05 DIAGNOSIS — M859 Disorder of bone density and structure, unspecified: Secondary | ICD-10-CM | POA: Diagnosis not present

## 2016-07-05 DIAGNOSIS — E038 Other specified hypothyroidism: Secondary | ICD-10-CM | POA: Diagnosis not present

## 2016-07-05 DIAGNOSIS — E784 Other hyperlipidemia: Secondary | ICD-10-CM | POA: Diagnosis not present

## 2016-07-05 DIAGNOSIS — I10 Essential (primary) hypertension: Secondary | ICD-10-CM | POA: Diagnosis not present

## 2016-07-05 LAB — TSH: TSH: 0.52 (ref <=5.90)

## 2016-07-06 NOTE — Telephone Encounter (Signed)
Spoke with patient and advised her that BP readings look good according to Dr. Acie Fredrickson.  She verbalized understanding and thanked me for the call.  She is aware of follow-up appointment on 10/18

## 2016-07-09 DIAGNOSIS — R0789 Other chest pain: Secondary | ICD-10-CM | POA: Diagnosis not present

## 2016-07-09 DIAGNOSIS — E784 Other hyperlipidemia: Secondary | ICD-10-CM | POA: Diagnosis not present

## 2016-07-09 DIAGNOSIS — E038 Other specified hypothyroidism: Secondary | ICD-10-CM | POA: Diagnosis not present

## 2016-07-09 DIAGNOSIS — Z0000 Encounter for general adult medical examination without abnormal findings: Secondary | ICD-10-CM | POA: Diagnosis not present

## 2016-07-09 DIAGNOSIS — F39 Unspecified mood [affective] disorder: Secondary | ICD-10-CM | POA: Diagnosis not present

## 2016-07-09 DIAGNOSIS — Z1389 Encounter for screening for other disorder: Secondary | ICD-10-CM | POA: Diagnosis not present

## 2016-07-09 DIAGNOSIS — R42 Dizziness and giddiness: Secondary | ICD-10-CM | POA: Diagnosis not present

## 2016-07-09 DIAGNOSIS — K589 Irritable bowel syndrome without diarrhea: Secondary | ICD-10-CM | POA: Diagnosis not present

## 2016-07-09 DIAGNOSIS — I25119 Atherosclerotic heart disease of native coronary artery with unspecified angina pectoris: Secondary | ICD-10-CM | POA: Diagnosis not present

## 2016-07-09 DIAGNOSIS — I10 Essential (primary) hypertension: Secondary | ICD-10-CM | POA: Diagnosis not present

## 2016-07-09 DIAGNOSIS — Z6821 Body mass index (BMI) 21.0-21.9, adult: Secondary | ICD-10-CM | POA: Diagnosis not present

## 2016-07-09 DIAGNOSIS — K219 Gastro-esophageal reflux disease without esophagitis: Secondary | ICD-10-CM | POA: Diagnosis not present

## 2016-07-13 ENCOUNTER — Encounter: Payer: Self-pay | Admitting: Cardiovascular Disease

## 2016-07-14 ENCOUNTER — Encounter: Payer: Self-pay | Admitting: Cardiovascular Disease

## 2016-07-14 ENCOUNTER — Ambulatory Visit (INDEPENDENT_AMBULATORY_CARE_PROVIDER_SITE_OTHER): Payer: Medicare Other | Admitting: Cardiovascular Disease

## 2016-07-14 VITALS — BP 122/70 | HR 72 | Ht 60.5 in | Wt 116.4 lb

## 2016-07-14 DIAGNOSIS — I2510 Atherosclerotic heart disease of native coronary artery without angina pectoris: Secondary | ICD-10-CM

## 2016-07-14 DIAGNOSIS — I2584 Coronary atherosclerosis due to calcified coronary lesion: Secondary | ICD-10-CM

## 2016-07-14 DIAGNOSIS — I10 Essential (primary) hypertension: Secondary | ICD-10-CM

## 2016-07-14 DIAGNOSIS — E785 Hyperlipidemia, unspecified: Secondary | ICD-10-CM | POA: Diagnosis not present

## 2016-07-14 NOTE — Progress Notes (Signed)
Ernie Hew Date of Birth  1941-08-18       [address]    [place]     [place],   [address]      Problem list: 1. History of chest pain-normal coronary artery heart catheterization Had coronary artery calcifications by CT scan   2. Hyperlipidemia 3. Hypertension 4. Hypothyroidism  History of Present Illness:  Pt is doing well from a cardiac standpoint.  She continues to have anxiety.  She takes Klonipin on occasion which helps.  She has not had any cardiac problems.  Feb 20, 2016:  [facility] was seen by [facility] of abdominal issues.   Got a CT scan  Was noted to incidentally found to have right coronary artery calcifications.   Does have some mild chest pressure like sensation with working outside, especially if on an incline.  Records from [facility] were reviewed.   Chol = 247 Trigs = 89 HDL = 71 LDL 158.   Is having lots of dizziness with the Buspar.    Oct. 18, 2017: She is doing well Seems to be tolerating the amlodipine  No longer has this vague chest tightness. ( ? Esophageal spasm )    Current Outpatient Prescriptions  Medication Sig Dispense Refill  . acetaminophen (TYLENOL) 325 MG tablet Take 650 mg by mouth every 6 (six) hours as needed.    Marland Kitchen amLODipine (NORVASC) 10 MG tablet Take 1 tablet (10 mg total) by mouth daily. 30 tablet 6  . aspirin 81 MG tablet Take 81 mg by mouth at bedtime.     Marland Kitchen atorvastatin (LIPITOR) 20 MG tablet Take 1 tablet (20 mg total) by mouth daily. 30 tablet 11  . Calcium Carbonate-Vitamin D (CALCIUM + D PO) Take by mouth daily.    . clonazePAM (KLONOPIN) 0.5 MG tablet Take 0.25 mg by mouth 2 (two) times daily as needed for anxiety.    . cycloSPORINE (RESTASIS) 0.05 % ophthalmic emulsion Place 1 drop into both eyes every 12 (twelve) hours.     Marland Kitchen levothyroxine (SYNTHROID, LEVOTHROID) 88 MCG tablet Take 88 mcg by mouth daily.    . Multiple Vitamins-Minerals (PRESERVISION AREDS PO) Take 1  tablet by mouth 2 (two) times daily.     Marland Kitchen Propylene Glycol (SYSTANE BALANCE OP) Apply 1 drop to eye daily. Reported on 02/27/2016     No current facility-administered medications for this visit.    Family History  Problem Relation Age of Onset  . Bladder Cancer Father   . Mitral valve prolapse Mother   . Cancer Maternal Grandfather    Smoked briefly as a teenager.   Allergies  Allergen Reactions  . Fluticasone Other (See Comments)    DRY EYES  . Codeine   . Crestor [Rosuvastatin Calcium]     Intolerant   . Lipitor [Atorvastatin Calcium]     Intolerant   . Penicillins   . Sulfa Antibiotics     vomiting    Past Medical History:  Diagnosis Date  . Anxiety   . Arthritis   . Biceps tendonitis 01/05/2013  . 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 Chest pain   . HTN (hypertension)    no meds  . Hypercholesterolemia   . Hypothyroid   . Hypothyroidism   . IBS (irritable bowel syndrome)   . Impingement syndrome of right shoulder 01/05/2013  . MVP (mitral valve prolapse)    Hx of   . Normal coronary arteries    by  heart Cath  . Osteopenia   . Right rotator cuff tear 01/05/2013  . Seasonal allergies     Past Surgical History:  Procedure Laterality Date  . ANTERIOR CERVICAL DECOMP/DISCECTOMY FUSION  04/2015    Dr. Pamala Hurry, [facility]  . CARDIAC CATHETERIZATION  2004   which revealed smooth and normal coronary arteries  . CARPAL TUNNEL RELEASE  2000   lt  . CATARACT EXTRACTION W/ INTRAOCULAR LENS  IMPLANT, BILATERAL  1/17   Dr. Lucita Ferrara  . COSMETIC SURGERY  2010   eyes-facial  . DENTAL SURGERY  1960  . LASIK    . SHOULDER ARTHROSCOPY WITH ROTATOR CUFF REPAIR Right 01/05/2013   Procedure: SHOULDER ARTHROSCOPY WITH ARTHROSCOPIC  ROTATOR CUFF REPAIR, ACROMIOPLASTY, EXTENSIVE DEBRIDEMENT;  Surgeon: Johnny Bridge, MD;  Location: [facility];  Service: Orthopedics;  Laterality: Right;  . TONSILLECTOMY    . TUBAL LIGATION      History  Smoking Status  . Former  Smoker  . Quit date: 01/05/1960  Smokeless Tobacco  . Never Used    Comment: Remote Hx    History  Alcohol Use  . Yes    Family History  Problem Relation Age of Onset  . Bladder Cancer Father   . Mitral valve prolapse Mother   . Cancer Maternal Grandfather     Reviw of Systems:  Reviewed in the HPI.  All other systems are negative.  Physical Exam: Blood pressure 122/70, pulse 72, height 5' 0.5" (1.537 m), weight 116 lb 6.4 oz (52.8 kg), last menstrual period 09/28/1979. General: Well developed, well nourished, in no acute distress. Head: Normocephalic, atraumatic, sclera non-icteric, mucus membranes are moist,  Neck: Supple. Carotids are 2 + without bruits. No JVD Lungs: Clear bilaterally to auscultation. Heart: regular rate.  normal  S1 S2. No murmurs, gallops or rubs. Abdomen: Soft, non-tender, non-distended with normal bowel sounds. No hepatomegaly. No rebound/guarding. No masses. Msk:  Strength and tone are normal Extremities: No clubbing or cyanosis. No edema.  Distal pedal pulses are 2+ and equal bilaterally. Neuro: Alert and oriented X 3. Moves all extremities spontaneously. Psych:  Responds to questions appropriately with a normal affect.  ECG:  Assessment / Plan:   1. Coronary artery calcification :   Had a normal cath 10-15 years ago  She has some CP with exertion and has hyperlipidemia Will get a [employer] for further evaluation   2. Hyperlipidemia:    Initial lipids  Chol = 247 Trigs = 89 HDL = 71 LDL 158.     Started Atorvastatin 20 a day  Lab Results  Component Value Date   CHOL 168 06/03/2016   HDL 86 06/03/2016   LDLCALC 67 06/03/2016   TRIG 77 06/03/2016   CHOLHDL 2.0 06/03/2016   Continue. Will see her In 6 months. We'll check fasting labs at that time.   [name], MD  07/14/2016 2:42 PM    [facility] [place],  [address] [place], [place]  [address] Pager [phone] Phone: [phone];  Fax: [phone]

## 2016-07-14 NOTE — Patient Instructions (Signed)

## 2016-08-04 DIAGNOSIS — Z6822 Body mass index (BMI) 22.0-22.9, adult: Secondary | ICD-10-CM | POA: Diagnosis not present

## 2016-08-04 DIAGNOSIS — R42 Dizziness and giddiness: Secondary | ICD-10-CM | POA: Diagnosis not present

## 2016-08-04 DIAGNOSIS — R609 Edema, unspecified: Secondary | ICD-10-CM | POA: Diagnosis not present

## 2016-08-04 DIAGNOSIS — I10 Essential (primary) hypertension: Secondary | ICD-10-CM | POA: Diagnosis not present

## 2016-08-04 DIAGNOSIS — R600 Localized edema: Secondary | ICD-10-CM | POA: Diagnosis not present

## 2016-08-04 DIAGNOSIS — M5416 Radiculopathy, lumbar region: Secondary | ICD-10-CM | POA: Diagnosis not present

## 2016-08-05 ENCOUNTER — Telehealth: Payer: Self-pay | Admitting: Cardiovascular Disease

## 2016-08-05 NOTE — Telephone Encounter (Signed)
I agree with note from Christen Bame, RN. I would prefer that she decrease her amlodipine to 5 mg a day which will help with both the leg swelling and dizziness. Her her continue to monitor her BP

## 2016-08-05 NOTE — Telephone Encounter (Signed)
Spoke with patient who states she thinks she may have some of her medications confused. She states since she saw Dr. Acie Fredrickson on 10/18 she has had dizziness and bilateral ankle swelling. She realized last night that she takes 2 medications that start with "A" and she wants to verify that she is supposed to take both.  I verified that she has been prescribed amlodipine 10 mg daily and atorvastatin 20 mg daily.  She reports BP consistently [id number] systolic and [id number] diastolic but she does not monitor it daily at home.  She was recently seen in Dr. Jaynie Bream office for a nurse visit and was told to call and ask if she could lower the amlodipine dose to 5 mg daily to help with dizziness and leg swelling.  I advised that I will review with Dr. Acie Fredrickson and call her back, likely tomorrow since he is off this afternoon.  She verbalized understanding and agreement and thanked me for the call.

## 2016-08-05 NOTE — Telephone Encounter (Signed)
New message  Pt c/o medication issue:  1. Name of Medication: amlodipine ...Marland KitchenMarland KitchenMarland Kitchen Atorvastatin   2. How are you currently taking this medication (dosage and times per day)? 10mg .... 20mg ...  3. Are you having a reaction (difficulty breathing--STAT)? Dizziness and ankle swelling   4. What is your medication issue? Per pt thinks she has been taking medication incorrectly. Pt states she does not know if she needs to be taking both medications together Please call back to discuss

## 2016-08-06 MED ORDER — AMLODIPINE BESYLATE 10 MG PO TABS: 5.0000 mg | ORAL_TABLET | Freq: Every day | ORAL | 6 refills | Status: DC

## 2016-08-06 NOTE — Telephone Encounter (Signed)
Spoke with patient and reviewed Dr. Elmarie Shiley advice.  She verbalized understanding and agreement and thanked me for the call.

## 2016-08-09 DIAGNOSIS — D3131 Benign neoplasm of right choroid: Secondary | ICD-10-CM | POA: Diagnosis not present

## 2016-08-09 DIAGNOSIS — Z9889 Other specified postprocedural states: Secondary | ICD-10-CM | POA: Diagnosis not present

## 2016-08-09 DIAGNOSIS — H43393 Other vitreous opacities, bilateral: Secondary | ICD-10-CM | POA: Diagnosis not present

## 2016-08-09 DIAGNOSIS — Z961 Presence of intraocular lens: Secondary | ICD-10-CM | POA: Diagnosis not present

## 2016-08-09 DIAGNOSIS — Z87891 Personal history of nicotine dependence: Secondary | ICD-10-CM | POA: Diagnosis not present

## 2016-08-09 DIAGNOSIS — H02834 Dermatochalasis of left upper eyelid: Secondary | ICD-10-CM | POA: Diagnosis not present

## 2016-08-09 DIAGNOSIS — R030 Elevated blood-pressure reading, without diagnosis of hypertension: Secondary | ICD-10-CM | POA: Diagnosis not present

## 2016-08-09 DIAGNOSIS — M47816 Spondylosis without myelopathy or radiculopathy, lumbar region: Secondary | ICD-10-CM | POA: Diagnosis not present

## 2016-08-09 DIAGNOSIS — H02831 Dermatochalasis of right upper eyelid: Secondary | ICD-10-CM | POA: Diagnosis not present

## 2016-08-09 DIAGNOSIS — Z9841 Cataract extraction status, right eye: Secondary | ICD-10-CM | POA: Diagnosis not present

## 2016-08-09 DIAGNOSIS — Z9842 Cataract extraction status, left eye: Secondary | ICD-10-CM | POA: Diagnosis not present

## 2016-08-09 DIAGNOSIS — H43813 Vitreous degeneration, bilateral: Secondary | ICD-10-CM | POA: Diagnosis not present

## 2016-08-09 DIAGNOSIS — H353132 Nonexudative age-related macular degeneration, bilateral, intermediate dry stage: Secondary | ICD-10-CM | POA: Diagnosis not present

## 2016-09-06 DIAGNOSIS — H35363 Drusen (degenerative) of macula, bilateral: Secondary | ICD-10-CM | POA: Diagnosis not present

## 2016-09-15 DIAGNOSIS — R030 Elevated blood-pressure reading, without diagnosis of hypertension: Secondary | ICD-10-CM | POA: Diagnosis not present

## 2016-09-15 DIAGNOSIS — M47816 Spondylosis without myelopathy or radiculopathy, lumbar region: Secondary | ICD-10-CM | POA: Diagnosis not present

## 2016-09-23 ENCOUNTER — Telehealth: Payer: Self-pay | Admitting: *Deleted

## 2016-09-23 MED ORDER — AMLODIPINE BESYLATE 5 MG PO TABS: 5.0000 mg | ORAL_TABLET | Freq: Every day | ORAL | 3 refills | Status: DC

## 2016-09-23 NOTE — Telephone Encounter (Signed)
New Rx for amlodipine 5 mg sent to patient's pharmacy per request

## 2016-10-04 DIAGNOSIS — L661 Lichen planopilaris: Secondary | ICD-10-CM | POA: Diagnosis not present

## 2016-10-04 DIAGNOSIS — L820 Inflamed seborrheic keratosis: Secondary | ICD-10-CM | POA: Diagnosis not present

## 2016-10-04 DIAGNOSIS — L720 Epidermal cyst: Secondary | ICD-10-CM | POA: Diagnosis not present

## 2016-10-22 DIAGNOSIS — M1990 Unspecified osteoarthritis, unspecified site: Secondary | ICD-10-CM | POA: Diagnosis not present

## 2016-10-22 DIAGNOSIS — R221 Localized swelling, mass and lump, neck: Secondary | ICD-10-CM | POA: Diagnosis not present

## 2016-10-22 DIAGNOSIS — K219 Gastro-esophageal reflux disease without esophagitis: Secondary | ICD-10-CM | POA: Diagnosis not present

## 2016-10-22 DIAGNOSIS — M2669 Other specified disorders of temporomandibular joint: Secondary | ICD-10-CM | POA: Diagnosis not present

## 2016-10-26 ENCOUNTER — Other Ambulatory Visit: Payer: Self-pay | Admitting: Internal Medicine

## 2016-10-26 DIAGNOSIS — R221 Localized swelling, mass and lump, neck: Secondary | ICD-10-CM

## 2016-10-29 ENCOUNTER — Ambulatory Visit
Admission: RE | Admit: 2016-10-29 | Discharge: 2016-10-29 | Disposition: A | Discharge status: Home / Self Care | Payer: Medicare Other | Source: Ambulatory Visit | Attending: Internal Medicine | Admitting: Internal Medicine

## 2016-10-29 DIAGNOSIS — R221 Localized swelling, mass and lump, neck: Secondary | ICD-10-CM

## 2016-10-29 MED ORDER — IOPAMIDOL (ISOVUE-300) INJECTION 61%
75.0000 mL | Freq: Once | INTRAVENOUS | Status: AC | PRN
  Administered 2016-10-29: 75 mL via INTRAVENOUS

## 2016-11-01 ENCOUNTER — Other Ambulatory Visit: Payer: Self-pay | Admitting: Cardiovascular Disease

## 2016-11-01 ENCOUNTER — Telehealth: Payer: Self-pay | Admitting: Cardiovascular Disease

## 2016-11-01 NOTE — Telephone Encounter (Signed)
REFILL FOR  AMLODIPINE  SENT  TO PHARMACY./CY

## 2016-11-01 NOTE — Telephone Encounter (Signed)
Patient has refills on file at the pharmacy for the 5 mg tab. Patient stated that she cannot find the medication but the pharmacy claims that the patient picked the refill up in December for #90. I made her aware that if she has misplaced the medication then she would have to pay out of pocket until her insurance would allow her to refill it again. She wanted to know if she should still be taking the medication as she c/o dizziness and feels that it could be related to this medication. Patient can be reached at [phone]

## 2016-11-01 NOTE — Telephone Encounter (Signed)
Medication Detail    Disp Refills Start End   amLODipine (NORVASC) 5 MG tablet 90 tablet 3 09/23/2016 12/22/2016   Sig - Route: Take 1 tablet (5 mg total) by mouth daily. - Oral   E-Prescribing Status: Receipt confirmed by pharmacy (09/23/2016 1:56 PM EST)   Pharmacy   [facility] [id number] - , [place]

## 2016-11-01 NOTE — Telephone Encounter (Signed)
Follow up ° ° °Pt verbalized that she is returning call for rn °

## 2016-11-01 NOTE — Telephone Encounter (Signed)
Patient has missed placed her medication, amlodipine. Patient stated she can go to pharmacy and pay out of pocket for additional medication. Patient stated she only feels dizzy once in a while, and when she turns her head to the left. Patient stated her last BP was fine with SBP in the 140's. Encouraged patient to keep taking her amlodipine and record her BP's. Encouraged patient change positions lowly and to be careful when turning her head to the left. Patient is going to pharmacy now to pick up her medication.

## 2016-11-01 NOTE — Telephone Encounter (Signed)
Pt c/o medication issue:  1. Name of Medication: Amlodipine  2. How are you currently taking this medication (dosage and times per day)? once  3. Are you having a reaction (difficulty breathing--STAT)? no  4. What is your medication issue? Dosage was changed form 10 to 5 and pharmacy still has 10 on file, needs 30 day called into [place] and [place] for 5mg 

## 2016-11-10 ENCOUNTER — Telehealth: Payer: Self-pay | Admitting: Cardiovascular Disease

## 2016-11-10 NOTE — Telephone Encounter (Signed)
Spoke with patient who called to c/o dizziness.  I advised that the BP readings that were given to the operator were normal. She reports pulse has been running 66-78 bpm. She states she notices the pain more when she turns her head from side to side. I advised her to call her PCP to have this evaluated as it does not seem to be related to BP or pulse. She also c/o intermittent pain below collar bone in the front of her chest over the last few months. She denies that it worsens with movement. She denies SOB, nausea or diaphoresis. States the pain does not radiate and that she takes Klonopin, which relieves the pain. She states she wonders if it is anxiety. Her nuclear stress test in June was normal per Dr. Acie Fredrickson. I advised her to contact her PCP for evaluation of the dizziness and chest pain and to call back if chest pain worsens or if she develops SOB or other symptoms that accompany the chest discomfort. She verbalized understanding and agreement with plan and thanked me for the call.

## 2016-11-10 NOTE — Telephone Encounter (Signed)
New message    Pt c/o BP issue: STAT if pt c/o blurred vision, one-sided weakness or slurred speech  1. What are your last 5 BP readings? 2/8-126/67 2/10-131/75 2/11-132/78 today-140/83   2. Are you having any other symptoms (ex. Dizziness, headache, blurred vision, passed out)? Dizzy. Pt states she has tightness in her chest at shoulder level.   3. What is your BP issue? Blood pressure running high.

## 2016-11-11 DIAGNOSIS — H353131 Nonexudative age-related macular degeneration, bilateral, early dry stage: Secondary | ICD-10-CM | POA: Diagnosis not present

## 2016-11-11 DIAGNOSIS — H16223 Keratoconjunctivitis sicca, not specified as Sjogren's, bilateral: Secondary | ICD-10-CM | POA: Diagnosis not present

## 2016-11-11 DIAGNOSIS — Z961 Presence of intraocular lens: Secondary | ICD-10-CM | POA: Diagnosis not present

## 2016-11-11 NOTE — Telephone Encounter (Signed)
Agree with note from Christen Bame, RN. Her VS are normal.   I doubt her dizziness is due to a cardiac etiology

## 2016-12-18 DIAGNOSIS — F411 Generalized anxiety disorder: Secondary | ICD-10-CM | POA: Diagnosis not present

## 2016-12-20 ENCOUNTER — Telehealth: Payer: Self-pay | Admitting: Cardiovascular Disease

## 2016-12-20 NOTE — Telephone Encounter (Signed)
Spoke with patient who states she is having issues with memory loss and her psychologist would like her to hold lipitor for a few weeks to see if it improves. She is scheduled for office visit with fasting lab work on 4/26. I advised she may hold lipitor until that visit. She verbalized understanding and agreement with plan and thanked me for the call.

## 2016-12-20 NOTE — Telephone Encounter (Signed)
New Message     Please call pt has question on the   atorvastatin (LIPITOR) 20 MG tablet Take 1 tablet (20 mg total) by mouth daily.   Her Psychologist wants her to stop taking it for 4 weeks to see if it helps with her memory loss.   Please call and advise

## 2016-12-31 DIAGNOSIS — M542 Cervicalgia: Secondary | ICD-10-CM | POA: Diagnosis not present

## 2016-12-31 DIAGNOSIS — H6123 Impacted cerumen, bilateral: Secondary | ICD-10-CM | POA: Diagnosis not present

## 2017-01-03 ENCOUNTER — Encounter: Payer: Self-pay | Admitting: Cardiovascular Disease

## 2017-01-11 DIAGNOSIS — Z6821 Body mass index (BMI) 21.0-21.9, adult: Secondary | ICD-10-CM | POA: Diagnosis not present

## 2017-01-11 DIAGNOSIS — R42 Dizziness and giddiness: Secondary | ICD-10-CM | POA: Diagnosis not present

## 2017-01-11 DIAGNOSIS — F419 Anxiety disorder, unspecified: Secondary | ICD-10-CM | POA: Diagnosis not present

## 2017-01-11 DIAGNOSIS — I10 Essential (primary) hypertension: Secondary | ICD-10-CM | POA: Diagnosis not present

## 2017-01-15 DIAGNOSIS — F411 Generalized anxiety disorder: Secondary | ICD-10-CM | POA: Diagnosis not present

## 2017-01-20 ENCOUNTER — Ambulatory Visit (INDEPENDENT_AMBULATORY_CARE_PROVIDER_SITE_OTHER): Payer: Medicare Other | Admitting: Cardiovascular Disease

## 2017-01-20 ENCOUNTER — Encounter: Payer: Self-pay | Admitting: Cardiovascular Disease

## 2017-01-20 VITALS — BP 120/76 | HR 70 | Ht 60.5 in | Wt 116.2 lb

## 2017-01-20 DIAGNOSIS — I10 Essential (primary) hypertension: Secondary | ICD-10-CM | POA: Diagnosis not present

## 2017-01-20 DIAGNOSIS — I2584 Coronary atherosclerosis due to calcified coronary lesion: Secondary | ICD-10-CM

## 2017-01-20 DIAGNOSIS — I2510 Atherosclerotic heart disease of native coronary artery without angina pectoris: Secondary | ICD-10-CM

## 2017-01-20 NOTE — Patient Instructions (Signed)
Medication Instructions:  STOP Atorvastatin   Labwork: None Ordered   Testing/Procedures: None Ordered   Follow-Up: Your physician wants you to follow-up in: 6 months with Dr. Acie Fredrickson.  You will receive a reminder letter in the mail two months in advance. If you don't receive a letter, please call our office to schedule the follow-up appointment.   If you need a refill on your cardiac medications before your next appointment, please call your pharmacy.   Thank you for choosing [facility]! Christen Bame, RN [phone]

## 2017-01-20 NOTE — Progress Notes (Signed)
Bianca Garcia Date of Birth  05-Oct-1940       [address]    [place]     [place], [place]  [address]      Problem list: 1. History of chest pain-normal coronary artery heart catheterization Had coronary artery calcifications by CT scan   2. Hyperlipidemia 3. Hypertension 4. Hypothyroidism  History of Present Illness:  Pt is doing well from a cardiac standpoint.  She continues to have anxiety.  She takes Klonipin on occasion which helps.  She has not had any cardiac problems.  Feb 20, 2016:  [name] was seen by [facility] of abdominal issues.   Got a CT scan  Was noted to incidentally found to have right coronary artery calcifications.   Does have some mild chest pressure like sensation with working outside, especially if on an incline.  Records from [facility] were reviewed.   Chol = 247 Trigs = 89 HDL = 71 LDL 158.   Is having lots of dizziness with the Buspar.    Oct. 18, 2017: She is doing well Seems to be tolerating the amlodipine  No longer has this vague chest tightness. ( ? Esophageal spasm )   January 20, 2017:  Bianca Garcia is seen today for follow-up visit. BP is doing well.  Still has some CP ,  myoview was negative in 2017.   Has had a normal cath in the past.    Saw her primary MD - tried Sertraline . She is feeling better after stopping the Atorvastatin  Doing well on the amlodipine    Current Outpatient Prescriptions  Medication Sig Dispense Refill  . acetaminophen (TYLENOL) 325 MG tablet Take 650 mg by mouth every 6 (six) hours as needed.    Marland Kitchen amLODipine (NORVASC) 5 MG tablet TAKE 1 TABLET(5 MG) BY MOUTH DAILY 90 tablet 3  . aspirin 81 MG tablet Take 81 mg by mouth at bedtime.     Marland Kitchen atorvastatin (LIPITOR) 20 MG tablet Take 1 tablet (20 mg total) by mouth daily. (Patient taking differently: Take 20 mg by mouth daily. ) 30 tablet 11  . Biotin 5000 MCG TABS Take 1 tablet by mouth daily.    . Calcium Carbonate-Vitamin D  (CALCIUM + D PO) Take by mouth daily.    . clonazePAM (KLONOPIN) 0.5 MG tablet Take 0.25 mg by mouth 2 (two) times daily as needed for anxiety.    . cycloSPORINE (RESTASIS) 0.05 % ophthalmic emulsion Place 1 drop into both eyes every 12 (twelve) hours.     Marland Kitchen levothyroxine (SYNTHROID, LEVOTHROID) 88 MCG tablet Take 88 mcg by mouth daily.    . Multiple Vitamins-Minerals (PRESERVISION AREDS PO) Take 1 tablet by mouth 2 (two) times daily.     Marland Kitchen omeprazole (PRILOSEC) 40 MG capsule Take 40 mg by mouth daily.  3  . Propylene Glycol (SYSTANE BALANCE OP) Apply 1 drop to eye daily. Reported on 02/27/2016     No current facility-administered medications for this visit.    Family History  Problem Relation Age of Onset  . Bladder Cancer Father   . Mitral valve prolapse Mother   . Cancer Maternal Grandfather    Smoked briefly as a teenager.   Allergies  Allergen Reactions  . Fluticasone Other (See Comments)    DRY EYES  . Codeine   . Crestor [Rosuvastatin Calcium]     Intolerant   . Lipitor [Atorvastatin Calcium]     Intolerant   .  Penicillins   . Sulfa Antibiotics     vomiting    Past Medical History:  Diagnosis Date  . Anxiety   . Arthritis   . Biceps tendonitis 01/05/2013  . 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 Chest pain   . HTN (hypertension)    no meds  . Hypercholesterolemia   . Hypothyroid   . Hypothyroidism   . IBS (irritable bowel syndrome)   . Impingement syndrome of right shoulder 01/05/2013  . MVP (mitral valve prolapse)    Hx of   . Normal coronary arteries    by heart Cath  . Osteopenia   . Right rotator cuff tear 01/05/2013  . Seasonal allergies     Past Surgical History:  Procedure Laterality Date  . ANTERIOR CERVICAL DECOMP/DISCECTOMY FUSION  04/2015    Dr. Pamala Hurry, [place]  . CARDIAC CATHETERIZATION  2004   which revealed smooth and normal coronary arteries  . CARPAL TUNNEL RELEASE  2000   lt  . CATARACT EXTRACTION W/ INTRAOCULAR LENS  IMPLANT, BILATERAL   1/17   Dr. Lucita Ferrara  . COSMETIC SURGERY  2010   eyes-facial  . DENTAL SURGERY  1960  . LASIK    . SHOULDER ARTHROSCOPY WITH ROTATOR CUFF REPAIR Right 01/05/2013   Procedure: SHOULDER ARTHROSCOPY WITH ARTHROSCOPIC  ROTATOR CUFF REPAIR, ACROMIOPLASTY, EXTENSIVE DEBRIDEMENT;  Surgeon: Johnny Bridge, MD;  Location: [facility];  Service: Orthopedics;  Laterality: Right;  . TONSILLECTOMY    . TUBAL LIGATION      History  Smoking Status  . Former Smoker  . Quit date: 01/05/1960  Smokeless Tobacco  . Never Used    Comment: Remote Hx    History  Alcohol Use  . Yes    Family History  Problem Relation Age of Onset  . Bladder Cancer Father   . Mitral valve prolapse Mother   . Cancer Maternal Grandfather     Reviw of Systems:  Reviewed in the HPI.  All other systems are negative.  Physical Exam: Blood pressure 120/76, pulse 70, height 5' 0.5" (1.537 m), weight 116 lb 3.2 oz (52.7 kg), last menstrual period 09/28/1979, SpO2 98 %. General: Well developed, well nourished, in no acute distress. Head: Normocephalic, atraumatic, sclera non-icteric, mucus membranes are moist,  Neck: Supple. Carotids are 2 + without bruits. No JVD Lungs: Clear bilaterally to auscultation. Heart: regular rate.  normal  S1 S2. No murmurs, gallops or rubs. Abdomen: Soft, non-tender, non-distended with normal bowel sounds. No hepatomegaly. No rebound/guarding. No masses. Msk:  Strength and tone are normal Extremities: No clubbing or cyanosis. No edema.  Distal pedal pulses are 2+ and equal bilaterally. Neuro: Alert and oriented X 3. Moves all extremities spontaneously. Psych:  Responds to questions appropriately with a normal affect.  ECG: January 20, 2017,  NSR at 70.   NS ST abn  Assessment / Plan:   1. Coronary artery calcification :   Had a normal cath 10-15 years ago  She has some CP with exertion and has hyperlipidemia Will get a [employer] for further evaluation   2.  Hyperlipidemia:   She did not tolerate atorvastatin  Follow up with Dr. Dagmar Hait.      [name], MD  01/20/2017 3:16 PM    [facility],  [place], [place]  [address] Pager [phone] Phone: [phone]; Fax: [phone]

## 2017-02-02 DIAGNOSIS — R1013 Epigastric pain: Secondary | ICD-10-CM | POA: Diagnosis not present

## 2017-02-02 DIAGNOSIS — R198 Other specified symptoms and signs involving the digestive system and abdomen: Secondary | ICD-10-CM | POA: Diagnosis not present

## 2017-02-15 DIAGNOSIS — Z6821 Body mass index (BMI) 21.0-21.9, adult: Secondary | ICD-10-CM | POA: Diagnosis not present

## 2017-02-15 DIAGNOSIS — F419 Anxiety disorder, unspecified: Secondary | ICD-10-CM | POA: Diagnosis not present

## 2017-02-15 DIAGNOSIS — K589 Irritable bowel syndrome without diarrhea: Secondary | ICD-10-CM | POA: Diagnosis not present

## 2017-02-15 DIAGNOSIS — I10 Essential (primary) hypertension: Secondary | ICD-10-CM | POA: Diagnosis not present

## 2017-02-15 DIAGNOSIS — R42 Dizziness and giddiness: Secondary | ICD-10-CM | POA: Diagnosis not present

## 2017-03-03 DIAGNOSIS — K210 Gastro-esophageal reflux disease with esophagitis: Secondary | ICD-10-CM | POA: Diagnosis not present

## 2017-03-03 DIAGNOSIS — R1084 Generalized abdominal pain: Secondary | ICD-10-CM | POA: Diagnosis not present

## 2017-03-03 DIAGNOSIS — Z86010 Personal history of colonic polyps: Secondary | ICD-10-CM | POA: Diagnosis not present

## 2017-04-06 DIAGNOSIS — L570 Actinic keratosis: Secondary | ICD-10-CM | POA: Diagnosis not present

## 2017-04-13 DIAGNOSIS — K449 Diaphragmatic hernia without obstruction or gangrene: Secondary | ICD-10-CM | POA: Diagnosis not present

## 2017-04-13 DIAGNOSIS — R1084 Generalized abdominal pain: Secondary | ICD-10-CM | POA: Diagnosis not present

## 2017-04-13 DIAGNOSIS — K5730 Diverticulosis of large intestine without perforation or abscess without bleeding: Secondary | ICD-10-CM | POA: Diagnosis not present

## 2017-04-13 DIAGNOSIS — Z86010 Personal history of colonic polyps: Secondary | ICD-10-CM | POA: Diagnosis not present

## 2017-04-13 DIAGNOSIS — D126 Benign neoplasm of colon, unspecified: Secondary | ICD-10-CM | POA: Diagnosis not present

## 2017-04-13 DIAGNOSIS — K219 Gastro-esophageal reflux disease without esophagitis: Secondary | ICD-10-CM | POA: Diagnosis not present

## 2017-04-18 DIAGNOSIS — D126 Benign neoplasm of colon, unspecified: Secondary | ICD-10-CM | POA: Diagnosis not present

## 2017-05-16 DIAGNOSIS — H43393 Other vitreous opacities, bilateral: Secondary | ICD-10-CM | POA: Diagnosis not present

## 2017-05-16 DIAGNOSIS — Z87891 Personal history of nicotine dependence: Secondary | ICD-10-CM | POA: Diagnosis not present

## 2017-05-16 DIAGNOSIS — H43813 Vitreous degeneration, bilateral: Secondary | ICD-10-CM | POA: Diagnosis not present

## 2017-05-16 DIAGNOSIS — H353132 Nonexudative age-related macular degeneration, bilateral, intermediate dry stage: Secondary | ICD-10-CM | POA: Diagnosis not present

## 2017-05-16 DIAGNOSIS — Z880 Allergy status to penicillin: Secondary | ICD-10-CM | POA: Diagnosis not present

## 2017-05-16 DIAGNOSIS — Z882 Allergy status to sulfonamides status: Secondary | ICD-10-CM | POA: Diagnosis not present

## 2017-05-16 DIAGNOSIS — H35363 Drusen (degenerative) of macula, bilateral: Secondary | ICD-10-CM | POA: Diagnosis not present

## 2017-05-16 DIAGNOSIS — Z7982 Long term (current) use of aspirin: Secondary | ICD-10-CM | POA: Diagnosis not present

## 2017-05-16 DIAGNOSIS — Z79899 Other long term (current) drug therapy: Secondary | ICD-10-CM | POA: Diagnosis not present

## 2017-05-16 DIAGNOSIS — D3131 Benign neoplasm of right choroid: Secondary | ICD-10-CM | POA: Diagnosis not present

## 2017-05-16 DIAGNOSIS — Z885 Allergy status to narcotic agent status: Secondary | ICD-10-CM | POA: Diagnosis not present

## 2017-05-16 DIAGNOSIS — Z888 Allergy status to other drugs, medicaments and biological substances status: Secondary | ICD-10-CM | POA: Diagnosis not present

## 2017-05-23 DIAGNOSIS — L570 Actinic keratosis: Secondary | ICD-10-CM | POA: Diagnosis not present

## 2017-05-23 DIAGNOSIS — Z1231 Encounter for screening mammogram for malignant neoplasm of breast: Secondary | ICD-10-CM | POA: Diagnosis not present

## 2017-05-23 DIAGNOSIS — D1801 Hemangioma of skin and subcutaneous tissue: Secondary | ICD-10-CM | POA: Diagnosis not present

## 2017-05-23 DIAGNOSIS — L661 Lichen planopilaris: Secondary | ICD-10-CM | POA: Diagnosis not present

## 2017-05-23 DIAGNOSIS — L821 Other seborrheic keratosis: Secondary | ICD-10-CM | POA: Diagnosis not present

## 2017-06-06 DIAGNOSIS — R42 Dizziness and giddiness: Secondary | ICD-10-CM | POA: Diagnosis not present

## 2017-06-06 DIAGNOSIS — D649 Anemia, unspecified: Secondary | ICD-10-CM | POA: Diagnosis not present

## 2017-06-06 DIAGNOSIS — F419 Anxiety disorder, unspecified: Secondary | ICD-10-CM | POA: Diagnosis not present

## 2017-06-06 DIAGNOSIS — K589 Irritable bowel syndrome without diarrhea: Secondary | ICD-10-CM | POA: Diagnosis not present

## 2017-06-06 DIAGNOSIS — R251 Tremor, unspecified: Secondary | ICD-10-CM | POA: Diagnosis not present

## 2017-06-06 DIAGNOSIS — I10 Essential (primary) hypertension: Secondary | ICD-10-CM | POA: Diagnosis not present

## 2017-06-06 DIAGNOSIS — Z6821 Body mass index (BMI) 21.0-21.9, adult: Secondary | ICD-10-CM | POA: Diagnosis not present

## 2017-06-06 DIAGNOSIS — R2689 Other abnormalities of gait and mobility: Secondary | ICD-10-CM | POA: Diagnosis not present

## 2017-06-06 DIAGNOSIS — E038 Other specified hypothyroidism: Secondary | ICD-10-CM | POA: Diagnosis not present

## 2017-06-06 DIAGNOSIS — Z79899 Other long term (current) drug therapy: Secondary | ICD-10-CM | POA: Diagnosis not present

## 2017-06-06 DIAGNOSIS — R0989 Other specified symptoms and signs involving the circulatory and respiratory systems: Secondary | ICD-10-CM | POA: Diagnosis not present

## 2017-06-06 DIAGNOSIS — R609 Edema, unspecified: Secondary | ICD-10-CM | POA: Diagnosis not present

## 2017-06-06 DIAGNOSIS — I25119 Atherosclerotic heart disease of native coronary artery with unspecified angina pectoris: Secondary | ICD-10-CM | POA: Diagnosis not present

## 2017-06-10 ENCOUNTER — Other Ambulatory Visit: Payer: Self-pay | Admitting: Internal Medicine

## 2017-06-10 DIAGNOSIS — R2689 Other abnormalities of gait and mobility: Secondary | ICD-10-CM

## 2017-06-10 DIAGNOSIS — R251 Tremor, unspecified: Secondary | ICD-10-CM

## 2017-06-10 DIAGNOSIS — R42 Dizziness and giddiness: Secondary | ICD-10-CM

## 2017-06-16 DIAGNOSIS — J029 Acute pharyngitis, unspecified: Secondary | ICD-10-CM | POA: Diagnosis not present

## 2017-06-16 DIAGNOSIS — R05 Cough: Secondary | ICD-10-CM | POA: Diagnosis not present

## 2017-06-16 DIAGNOSIS — Z6821 Body mass index (BMI) 21.0-21.9, adult: Secondary | ICD-10-CM | POA: Diagnosis not present

## 2017-06-23 ENCOUNTER — Ambulatory Visit
Admission: RE | Admit: 2017-06-23 | Discharge: 2017-06-23 | Disposition: A | Discharge status: Home / Self Care | Payer: Medicare Other | Source: Ambulatory Visit | Attending: Internal Medicine | Admitting: Internal Medicine

## 2017-06-23 DIAGNOSIS — R2689 Other abnormalities of gait and mobility: Secondary | ICD-10-CM

## 2017-06-23 DIAGNOSIS — R42 Dizziness and giddiness: Secondary | ICD-10-CM | POA: Diagnosis not present

## 2017-06-23 DIAGNOSIS — R251 Tremor, unspecified: Secondary | ICD-10-CM

## 2017-06-23 MED ORDER — GADOBENATE DIMEGLUMINE 529 MG/ML IV SOLN
10.0000 mL | Freq: Once | INTRAVENOUS | Status: AC | PRN
  Administered 2017-06-23: 10 mL via INTRAVENOUS

## 2017-07-19 ENCOUNTER — Telehealth: Payer: Self-pay | Admitting: Cardiovascular Disease

## 2017-07-19 NOTE — Telephone Encounter (Signed)
Left message for patient to call back  

## 2017-07-19 NOTE — Telephone Encounter (Signed)
New message    Pt c/o of Chest Pain: STAT if CP now or developed within 24 hours  1. Are you having CP right now? yes  2. Are you experiencing any other symptoms (ex. SOB, nausea, vomiting, sweating)? no  3. How long have you been experiencing CP? A couple of months  4. Is your CP continuous or coming and going? Coming and going   5. Have you taken Nitroglycerin? no ?

## 2017-07-20 NOTE — Telephone Encounter (Signed)
Spoke with patient who state she is having worsening chest pain with exertion and/or stress. She states she is stressed because she they moving from their home to [place]. She states the pain is occurring more frequently and not just with activity. She states it prevents her from sleeping well through the night. I scheduled her for an office visit with Dr. Acie Fredrickson for tomorrow 10/25. She verbalized understanding and agreement with plan and thanked me for the call.

## 2017-07-21 ENCOUNTER — Encounter: Payer: Self-pay | Admitting: Cardiovascular Disease

## 2017-07-21 ENCOUNTER — Ambulatory Visit (INDEPENDENT_AMBULATORY_CARE_PROVIDER_SITE_OTHER): Payer: Medicare Other | Admitting: Cardiovascular Disease

## 2017-07-21 VITALS — BP 122/64 | HR 74 | Ht 62.0 in | Wt 114.4 lb

## 2017-07-21 DIAGNOSIS — I2584 Coronary atherosclerosis due to calcified coronary lesion: Secondary | ICD-10-CM | POA: Diagnosis not present

## 2017-07-21 DIAGNOSIS — R0789 Other chest pain: Secondary | ICD-10-CM

## 2017-07-21 DIAGNOSIS — I2510 Atherosclerotic heart disease of native coronary artery without angina pectoris: Secondary | ICD-10-CM | POA: Diagnosis not present

## 2017-07-21 MED ORDER — ROSUVASTATIN CALCIUM 5 MG PO TABS: 5.0000 mg | ORAL_TABLET | Freq: Every day | ORAL | 3 refills | Status: DC

## 2017-07-21 NOTE — Addendum Note (Signed)
Addended by: Emmaline Life on: 07/21/2017 11:29 AM   Modules accepted: Orders

## 2017-07-21 NOTE — Patient Instructions (Addendum)
Medication Instructions:  Your physician has recommended you make the following change in your medication: STOP Atorvastatin START Rosuvastatin (Crestor) 5 mg once daily   Labwork: Your physician recommends that you return for lab work in: 3 months  You will need to FAST for this appointment - nothing to eat or drink after midnight the night before except water.  Your physician recommends that you return for lab work in: basic metabolic panel approximately 1 week before your Coronary CT   Testing/Procedures: Please arrive at the [facility] main entrance of [facility] at xx:xx AM (30-45 minutes prior to test start time)  [facility] [place], [place] [address] [phone]  Proceed to the [facility] Radiology Department (First Floor).  Please follow these instructions carefully (unless otherwise directed):  Hold all erectile dysfunction medications at least 48 hours prior to test.  On the Night Before the Test: . Drink plenty of water. . Do not consume any caffeinated/decaffeinated beverages or chocolate 12 hours prior to your test. . Do not take any antihistamines 12 hours prior to your test.   On the Day of the Test: . Drink plenty of water. Do not drink any water within one hour of the test. . Do not eat any food 4 hours prior to the test. . You may take your regular medications prior to the test. . IF NOT ON A BETA BLOCKER - Take 50 mg of lopressor (metoprolol) one hour before the test. Sharyn Lull will send this to your pharmacy a few days before your test)  After the Test: . Drink plenty of water. . After receiving IV contrast, you may experience a mild flushed feeling. This is normal. . On occasion, you may experience a mild rash up to 24 hours after the test. This is not dangerous. If this occurs, you can take Benadryl 25 mg and increase your fluid intake. . If you experience trouble breathing, this can be serious. If it is severe  call 911 IMMEDIATELY. If it is mild, please call our office. . If you take any of these medications: Glipizide/Metformin, Avandament, Glucavance, please do not take 48 hours after completing test.   Follow-Up: Your physician wants you to follow-up in: 6 months with Dr. Acie Fredrickson.  You will receive a reminder letter in the mail two months in advance. If you don't receive a letter, please call our office to schedule the follow-up appointment.   If you need a refill on your cardiac medications before your next appointment, please call your pharmacy.   Thank you for choosing [facility]! Christen Bame, RN [phone]

## 2017-07-21 NOTE — Progress Notes (Signed)
Bianca Garcia Date of Birth  1941/09/06       [address]    [place]     [place], [place]  [address]      Problem list: 1. History of chest pain-normal coronary artery heart catheterization Had coronary artery calcifications by CT scan   2. Hyperlipidemia 3. Hypertension 4. Hypothyroidism  History of Present Illness:  Pt is doing well from a cardiac standpoint.  She continues to have anxiety.  She takes Klonipin on occasion which helps.  She has not had any cardiac problems.  Feb 20, 2016:  Bianca Garcia was seen by [facility] of abdominal issues.   Got a CT scan  Was noted to incidentally found to have right coronary artery calcifications.   Does have some mild chest pressure like sensation with working outside, especially if on an incline.  Records from [facility] were reviewed.   Chol = 247 Trigs = 89 HDL = 71 LDL 158.   Is having lots of dizziness with the Buspar.    Oct. 18, 2017: She is doing well Seems to be tolerating the amlodipine  No longer has this vague chest tightness. ( ? Esophageal spasm )   January 20, 2017:  Bianca Garcia is seen today for follow-up visit. BP is doing well.  Still has some CP ,  myoview was negative in 2017.   Has had a normal cath in the past.    Saw her primary MD - tried Sertraline . She is feeling better after stopping the Atorvastatin  Doing well on the amlodipine   Oct. 25, 2018 Continues to have frequent episodes of CP  Seems to be associated frequently with anxiety . Is having some CP right now Will last for a few minuets Constant ache  No worsened with exercise or walking   Was not able to tolerate the atrovastatin     Current Outpatient Prescriptions  Medication Sig Dispense Refill  . amLODipine (NORVASC) 5 MG tablet TAKE 1 TABLET(5 MG) BY MOUTH DAILY 90 tablet 3  . aspirin 81 MG tablet Take 81 mg by mouth at bedtime.     . Calcium Carbonate-Vitamin D (CALCIUM + D PO) Take by mouth  daily.    . clonazePAM (KLONOPIN) 0.5 MG tablet Take 0.25 mg by mouth 2 (two) times daily as needed for anxiety.    . cycloSPORINE (RESTASIS) 0.05 % ophthalmic emulsion Place 1 drop into both eyes every 12 (twelve) hours.     Marland Kitchen levothyroxine (SYNTHROID, LEVOTHROID) 88 MCG tablet Take 88 mcg by mouth daily.    . Multiple Vitamins-Minerals (PRESERVISION AREDS PO) Take 1 tablet by mouth 2 (two) times daily.     Marland Kitchen omeprazole (PRILOSEC) 40 MG capsule Take 40 mg by mouth daily.  3  . Propylene Glycol (SYSTANE BALANCE OP) Apply 1 drop to eye daily. Reported on 02/27/2016     No current facility-administered medications for this visit.    Family History  Problem Relation Age of Onset  . Bladder Cancer Father   . Mitral valve prolapse Mother   . Cancer Maternal Grandfather    Smoked briefly as a teenager.   Allergies  Allergen Reactions  . Fluticasone Other (See Comments)    DRY EYES  . Codeine   . Crestor [Rosuvastatin Calcium]     Intolerant   . Lipitor [Atorvastatin Calcium]     Intolerant   . Penicillins   . Sulfa Antibiotics     vomiting  Past Medical History:  Diagnosis Date  . Anxiety   . Arthritis   . Biceps tendonitis 01/05/2013  . 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 Chest pain   . HTN (hypertension)    no meds  . Hypercholesterolemia   . Hypothyroid   . Hypothyroidism   . IBS (irritable bowel syndrome)   . Impingement syndrome of right shoulder 01/05/2013  . MVP (mitral valve prolapse)    Hx of   . Normal coronary arteries    by heart Cath  . Osteopenia   . Right rotator cuff tear 01/05/2013  . Seasonal allergies     Past Surgical History:  Procedure Laterality Date  . ANTERIOR CERVICAL DECOMP/DISCECTOMY FUSION  04/2015    Dr. Pamala Hurry, [place]  . CARDIAC CATHETERIZATION  2004   which revealed smooth and normal coronary arteries  . CARPAL TUNNEL RELEASE  2000   lt  . CATARACT EXTRACTION W/ INTRAOCULAR LENS  IMPLANT, BILATERAL  1/17   Dr. Lucita Ferrara  .  COSMETIC SURGERY  2010   eyes-facial  . DENTAL SURGERY  1960  . LASIK    . SHOULDER ARTHROSCOPY WITH ROTATOR CUFF REPAIR Right 01/05/2013   Procedure: SHOULDER ARTHROSCOPY WITH ARTHROSCOPIC  ROTATOR CUFF REPAIR, ACROMIOPLASTY, EXTENSIVE DEBRIDEMENT;  Surgeon: Johnny Bridge, MD;  Location: [facility];  Service: Orthopedics;  Laterality: Right;  . TONSILLECTOMY    . TUBAL LIGATION      History  Smoking Status  . Former Smoker  . Quit date: 01/05/1960  Smokeless Tobacco  . Never Used    Comment: Remote Hx    History  Alcohol Use  . Yes    Family History  Problem Relation Age of Onset  . Bladder Cancer Father   . Mitral valve prolapse Mother   . Cancer Maternal Grandfather     Reviw of Systems:  Reviewed in the HPI.  All other systems are negative.  Physical Exam: Blood pressure 122/64, pulse 74, height 5\' 2"  (1.575 m), weight 114 lb 6.4 oz (51.9 kg), last menstrual period 09/28/1979, SpO2 97 %.  GEN:  Well nourished, well developed in no acute distress HEENT: Normal NECK: No JVD; No carotid bruits LYMPHATICS: No lymphadenopathy CARDIAC: RR, no murmurs, rubs, gallops RESPIRATORY:  Clear to auscultation without rales, wheezing or rhonchi  ABDOMEN: Soft, non-tender, non-distended MUSCULOSKELETAL:  No edema; No deformity  SKIN: Warm and dry NEUROLOGIC:  Alert and oriented x 3   ECG: Oct. 25, 2018:   NSR , TWI in aVF ( no changes from previous ECG )   Assessment / Plan:   1. Coronary artery calcification : She is having some CP but these sound very atypical. Will get a coronary CT angiogram for further eval . She had a normal cath in 2004.        2.  Hyperlipidemia: Did not tolerate atorvastatin  Will try Crestor 5 mg a day .     [name], MD  07/21/2017 10:43 AM    [facility],  [place], [place]  [address] Pager [phone] Phone: [phone]; Fax: [phone]

## 2017-07-23 DIAGNOSIS — Z23 Encounter for immunization: Secondary | ICD-10-CM | POA: Diagnosis not present

## 2017-07-28 DIAGNOSIS — M859 Disorder of bone density and structure, unspecified: Secondary | ICD-10-CM | POA: Diagnosis not present

## 2017-07-28 DIAGNOSIS — R82998 Other abnormal findings in urine: Secondary | ICD-10-CM | POA: Diagnosis not present

## 2017-07-28 DIAGNOSIS — I10 Essential (primary) hypertension: Secondary | ICD-10-CM | POA: Diagnosis not present

## 2017-07-28 DIAGNOSIS — E7849 Other hyperlipidemia: Secondary | ICD-10-CM | POA: Diagnosis not present

## 2017-07-28 DIAGNOSIS — E038 Other specified hypothyroidism: Secondary | ICD-10-CM | POA: Diagnosis not present

## 2017-07-28 LAB — LIPID PANEL
HDL: 77 — AB (ref 35–70)
LDL Cholesterol: 112

## 2017-08-01 ENCOUNTER — Encounter: Payer: Self-pay | Admitting: Neurology

## 2017-08-01 ENCOUNTER — Ambulatory Visit (INDEPENDENT_AMBULATORY_CARE_PROVIDER_SITE_OTHER): Payer: Medicare Other | Admitting: Neurology

## 2017-08-01 ENCOUNTER — Other Ambulatory Visit: Payer: Medicare Other | Admitting: *Deleted

## 2017-08-01 DIAGNOSIS — I2584 Coronary atherosclerosis due to calcified coronary lesion: Secondary | ICD-10-CM | POA: Diagnosis not present

## 2017-08-01 DIAGNOSIS — R251 Tremor, unspecified: Secondary | ICD-10-CM | POA: Diagnosis not present

## 2017-08-01 DIAGNOSIS — R799 Abnormal finding of blood chemistry, unspecified: Secondary | ICD-10-CM | POA: Diagnosis not present

## 2017-08-01 DIAGNOSIS — I2510 Atherosclerotic heart disease of native coronary artery without angina pectoris: Secondary | ICD-10-CM | POA: Diagnosis not present

## 2017-08-01 DIAGNOSIS — R202 Paresthesia of skin: Secondary | ICD-10-CM | POA: Diagnosis not present

## 2017-08-01 DIAGNOSIS — R0789 Other chest pain: Secondary | ICD-10-CM

## 2017-08-01 DIAGNOSIS — E559 Vitamin D deficiency, unspecified: Secondary | ICD-10-CM | POA: Diagnosis not present

## 2017-08-01 LAB — BASIC METABOLIC PANEL
BUN/Creatinine Ratio: 17 (ref 12–28)
BUN: 14 mg/dL (ref 8–27)
CO2: 26 mmol/L (ref 20–29)
Calcium: 9.5 mg/dL (ref 8.7–10.3)
Chloride: 100 mmol/L (ref 96–106)
Creatinine, Ser: 0.82 mg/dL (ref 0.57–1.00)
GFR calc Af Amer: 80 mL/min/{1.73_m2} (ref >59)
GFR calc non Af Amer: 69 mL/min/{1.73_m2} (ref >59)
Glucose: 93 mg/dL (ref 65–99)
Potassium: 3.9 mmol/L (ref 3.5–5.2)
Sodium: 139 mmol/L (ref 134–144)

## 2017-08-01 MED ORDER — PROPRANOLOL HCL 40 MG PO TABS: 40.0000 mg | ORAL_TABLET | Freq: Two times a day (BID) | ORAL | 11 refills | Status: DC

## 2017-08-01 NOTE — Progress Notes (Signed)
PATIENT: Bianca Garcia DOB: [birth year]  Chief Complaint  Patient presents with  . Tremors    Reports worsening tremors in her bilateral hands.  Clonidine is helpful for her symptoms but causes drowsiness.  . Dizziness    Orthostatic Vitals:  Lying: 152/83, 68, Sitting: 157/79, 65, Standing: 145/82, 69, Standing x 3 minutes: 128/81, 73.  States sudden position changes cause her feel unsteady.  She also complains of ringing in her ears.  Marland Kitchen PCP    Prince Solian, MD     HISTORICAL  Bianca Garcia is a 76 year old female, seen in refer by her primary care doctor Avva, Ravisankar for evaluation of tremor, dizziness,  Initial evaluation was on August 01 2017.   I reviewed and summarized the referring note, she has past medical history of hyperlipidemia, mood disorder, hypothyroidism, hypertension, acid reflux disease,  She reported a six-month history of gradual onset bilateral hands tremor, difficulty writing, holding utensils, she had history of chronic anxiety, has been taking clonazepam 0.5 milligrams half tablets every morning as needed, which has been helpful,  Now with increased bilateral hands tremor, she continue take clonazepam 0.25 milligrams which has been helpful, but was concerned about the long-term side effect, she was started on Zoloft 25 mg a few days ago, complains some side effect, did not notice any significant improvement, she denied loss sense of smell, no gait abnormality,  She also complains of gradual onset bilateral feet numbness, no pain, she has mild lightheadedness dizziness when she stand up from seated position, she denies vertigo, transient, no fall, no passing out episode  Laboratory evaluations in September 2018, normal CMP, creatinine 0.8, normal CBC, hemoglobin of 14.1,  REVIEW OF SYSTEMS: Full 14 system review of systems performed and notable only for chest pain, hearing loss, ringing ears, spinning sensation, blurred vision, tremor, anxiety,  racing thoughts   ALLERGIES: Allergies  Allergen Reactions  . Fluticasone Other (See Comments)    DRY EYES  . Codeine   . Lipitor [Atorvastatin Calcium]     Intolerant   . Penicillins   . Sulfa Antibiotics     vomiting    HOME MEDICATIONS: Current Outpatient Medications  Medication Sig Dispense Refill  . amLODipine (NORVASC) 5 MG tablet TAKE 1 TABLET(5 MG) BY MOUTH DAILY 90 tablet 3  . aspirin 81 MG tablet Take 81 mg by mouth at bedtime.     . Calcium Carbonate-Vitamin D (CALCIUM + D PO) Take by mouth daily.    . clonazePAM (KLONOPIN) 0.5 MG tablet Take 0.25 mg by mouth 2 (two) times daily as needed for anxiety.    . cycloSPORINE (RESTASIS) 0.05 % ophthalmic emulsion Place 1 drop into both eyes every 12 (twelve) hours.     Marland Kitchen levothyroxine (SYNTHROID, LEVOTHROID) 88 MCG tablet Take 88 mcg by mouth daily.    . Multiple Vitamins-Minerals (PRESERVISION AREDS PO) Take 1 tablet by mouth 2 (two) times daily.     Marland Kitchen omeprazole (PRILOSEC) 40 MG capsule Take 40 mg by mouth daily.  3  . Propylene Glycol (SYSTANE BALANCE OP) Apply 1 drop to eye daily. Reported on 02/27/2016    . rosuvastatin (CRESTOR) 5 MG tablet Take 1 tablet (5 mg total) by mouth daily. 90 tablet 3  . sertraline (ZOLOFT) 25 MG tablet Take 25 mg daily by mouth.     No current facility-administered medications for this visit.     PAST MEDICAL HISTORY: Past Medical History:  Diagnosis Date  . Anxiety   .  Arthritis   . Biceps tendonitis 01/05/2013  . 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 Chest pain   . Dizziness   . HTN (hypertension)    no meds  . Hypercholesterolemia   . Hypothyroid   . Hypothyroidism   . IBS (irritable bowel syndrome)   . Impingement syndrome of right shoulder 01/05/2013  . MVP (mitral valve prolapse)    Hx of   . Normal coronary arteries    by heart Cath  . Osteopenia   . Right rotator cuff tear 01/05/2013  . Seasonal allergies   . Tremor     PAST SURGICAL HISTORY: Past Surgical History:    Procedure Laterality Date  . ANTERIOR CERVICAL DECOMP/DISCECTOMY FUSION  04/2015    Dr. Pamala Hurry, [place]  . CARDIAC CATHETERIZATION  2004   which revealed smooth and normal coronary arteries  . CARPAL TUNNEL RELEASE  2000   lt  . CATARACT EXTRACTION W/ INTRAOCULAR LENS  IMPLANT, BILATERAL  1/17   Dr. Lucita Ferrara  . COSMETIC SURGERY  2010   eyes-facial  . DENTAL SURGERY  1960  . LASIK    . TONSILLECTOMY    . TUBAL LIGATION      FAMILY HISTORY: Family History  Problem Relation Age of Onset  . Bladder Cancer Father   . Mitral valve prolapse Mother   . Cancer Maternal Grandfather     SOCIAL HISTORY:  Social History   Socioeconomic History  . Marital status: Married    Spouse name: Not on file  . Number of children: 2  . Years of education: 16 years  . Highest education level: Bachelor's degree (e.g., BA, AB, BS)  Social Needs  . Financial resource strain: Not on file  . Food insecurity - worry: Not on file  . Food insecurity - inability: Not on file  . Transportation needs - medical: Not on file  . Transportation needs - non-medical: Not on file  Occupational History  . Occupation: Retired  Tobacco Use  . Smoking status: Former Smoker    Last attempt to quit: 01/05/1960    Years since quitting: 57.6  . Smokeless tobacco: Never Used  . Tobacco comment: Remote Hx  Substance and Sexual Activity  . Alcohol use: No    Frequency: Never  . Drug use: No  . Sexual activity: No    Birth control/protection: Post-menopausal  Other Topics Concern  . Not on file  Social History Narrative   Lives at home with her husband.   Left-handed.   Occasional caffeine use.     PHYSICAL EXAM   Vitals:   08/01/17 1420  BP: (!) 152/83  Pulse: 68  Weight: 112 lb 12 oz (51.1 kg)  Height: 5\' 2"  (1.575 m)    Not recorded    Blood pressure lying down 153/76, heart rate of 66, standing up, 156/85 heart rate of 70  Body mass index is 20.62 kg/m.  PHYSICAL EXAMNIATION:  Gen:  NAD, conversant, well nourised, obese, well groomed                     Cardiovascular: Regular rate rhythm, no peripheral edema, warm, nontender. Eyes: Conjunctivae clear without exudates or hemorrhage Neck: Supple, no carotid bruits. Pulmonary: Clear to auscultation bilaterally   NEUROLOGICAL EXAM:  MENTAL STATUS: Speech:    Speech is normal; fluent and spontaneous with normal comprehension.  Cognition:     Orientation to time, place and person     Normal recent and remote memory  Normal Attention span and concentration     Normal Language, naming, repeating,spontaneous speech     Fund of knowledge   CRANIAL NERVES: CN II: Visual fields are full to confrontation. Fundoscopic exam is normal with sharp discs and no vascular changes. Pupils are round equal and briskly reactive to light. CN III, IV, VI: extraocular movement are normal. No ptosis. CN V: Facial sensation is intact to pinprick in all 3 divisions bilaterally. Corneal responses are intact.  CN VII: Face is symmetric with normal eye closure and smile. CN VIII: Hearing is normal to rubbing fingers CN IX, X: Palate elevates symmetrically. Phonation is normal. CN XI: Head turning and shoulder shrug are intact CN XII: Tongue is midline with normal movements and no atrophy.  MOTOR: She has bilateral hand posturing tremor, no rigidity, no weakness, no bradykinesia  REFLEXES: Reflexes are 2+ and symmetric at the biceps, triceps, knees, and trace at ankles. Plantar responses are flexor.  SENSORY: Mildly decreased light touch, vibratory sensation at toes  COORDINATION: Rapid alternating movements and fine finger movements are intact. There is no dysmetria on finger-to-nose and heel-knee-shin.    GAIT/STANCE: Posture is normal. Gait is steady with normal steps, base, arm swing, and turning. Heel and toe walking are normal. Tandem gait is normal.  Romberg is absent.   DIAGNOSTIC DATA (LABS, IMAGING, TESTING) - I  reviewed patient records, labs, notes, testing and imaging myself where available.   ASSESSMENT AND PLAN  Bianca Garcia is a 76 y.o. female    Essential tremor  Laboratory evaluations including thyroid function test Dizziness:  Even though I did not demonstrate orthostatic blood pressure changes today, her mild length dependent sensory changes, positional related complaints suggestive of orthostatic phenomenon,  Proceed with EMG nerve conduction study  Laboratory evaluations for possible peripheral neuropathy  Marcial Pacas, M.D. Ph.D.  [facility] [address] [place], [place] [address] Ph: [phone] Fax: [phone]  CC: Prince Solian, MD

## 2017-08-03 LAB — SEDIMENTATION RATE: Sed Rate: 2 mm/hr (ref 0–40)

## 2017-08-03 LAB — PROTEIN ELECTROPHORESIS
A/G Ratio: 1.3 (ref 0.7–1.7)
Albumin ELP: 4.1 g/dL (ref 2.9–4.4)
Alpha 1: 0.2 g/dL (ref 0.0–0.4)
Alpha 2: 0.7 g/dL (ref 0.4–1.0)
Beta: 1.1 g/dL (ref 0.7–1.3)
Gamma Globulin: 1 g/dL (ref 0.4–1.8)
Globulin, Total: 3.1 g/dL (ref 2.2–3.9)
Total Protein: 7.2 g/dL (ref 6.0–8.5)

## 2017-08-03 LAB — RPR: RPR Ser Ql: NONREACTIVE

## 2017-08-03 LAB — FERRITIN: Ferritin: 141 ng/mL (ref 15–150)

## 2017-08-03 LAB — VITAMIN D 25 HYDROXY (VIT D DEFICIENCY, FRACTURES): Vit D, 25-Hydroxy: 29.8 ng/mL — ABNORMAL LOW (ref 30.0–100.0)

## 2017-08-03 LAB — VITAMIN B12: Vitamin B-12: 708 pg/mL (ref 232–1245)

## 2017-08-03 LAB — CK: Total CK: 76 U/L (ref 24–173)

## 2017-08-03 LAB — ANA W/REFLEX IF POSITIVE: Anti Nuclear Antibody(ANA): NEGATIVE

## 2017-08-03 LAB — TSH: TSH: 0.774 u[IU]/mL (ref 0.450–4.500)

## 2017-08-03 LAB — HGB A1C W/O EAG: Hgb A1c MFr Bld: 5.5 % (ref 4.8–5.6)

## 2017-08-03 LAB — FOLATE: Folate: 17.8 ng/mL (ref >3.0)

## 2017-08-03 LAB — C-REACTIVE PROTEIN: CRP: 0.5 mg/L (ref 0.0–4.9)

## 2017-08-04 DIAGNOSIS — F39 Unspecified mood [affective] disorder: Secondary | ICD-10-CM | POA: Diagnosis not present

## 2017-08-04 DIAGNOSIS — K219 Gastro-esophageal reflux disease without esophagitis: Secondary | ICD-10-CM | POA: Diagnosis not present

## 2017-08-04 DIAGNOSIS — E038 Other specified hypothyroidism: Secondary | ICD-10-CM | POA: Diagnosis not present

## 2017-08-04 DIAGNOSIS — Z6821 Body mass index (BMI) 21.0-21.9, adult: Secondary | ICD-10-CM | POA: Diagnosis not present

## 2017-08-04 DIAGNOSIS — Z1389 Encounter for screening for other disorder: Secondary | ICD-10-CM | POA: Diagnosis not present

## 2017-08-04 DIAGNOSIS — F418 Other specified anxiety disorders: Secondary | ICD-10-CM | POA: Diagnosis not present

## 2017-08-04 DIAGNOSIS — I10 Essential (primary) hypertension: Secondary | ICD-10-CM | POA: Diagnosis not present

## 2017-08-04 DIAGNOSIS — Z0000 Encounter for general adult medical examination without abnormal findings: Secondary | ICD-10-CM | POA: Diagnosis not present

## 2017-08-04 DIAGNOSIS — K589 Irritable bowel syndrome without diarrhea: Secondary | ICD-10-CM | POA: Diagnosis not present

## 2017-08-04 DIAGNOSIS — I25119 Atherosclerotic heart disease of native coronary artery with unspecified angina pectoris: Secondary | ICD-10-CM | POA: Diagnosis not present

## 2017-08-04 DIAGNOSIS — E7849 Other hyperlipidemia: Secondary | ICD-10-CM | POA: Diagnosis not present

## 2017-08-04 DIAGNOSIS — M5030 Other cervical disc degeneration, unspecified cervical region: Secondary | ICD-10-CM | POA: Diagnosis not present

## 2017-08-04 NOTE — Progress Notes (Signed)
Ms. Joyner:

## 2017-08-08 ENCOUNTER — Telehealth: Payer: Self-pay | Admitting: *Deleted

## 2017-08-08 NOTE — Telephone Encounter (Signed)
-----   Message from Marcial Pacas, MD sent at 08/04/2017  4:34 PM EST ----- Please call patient: There is no significant abnormality on laboratory evaluations.

## 2017-08-08 NOTE — Telephone Encounter (Signed)
Spoke to patient she is aware of results

## 2017-08-15 ENCOUNTER — Telehealth: Payer: Self-pay | Admitting: *Deleted

## 2017-08-15 NOTE — Telephone Encounter (Signed)
Labs received from PCP [facility]) - collected 08/08/17:  CMP: wnl  CBC: wnl  LIPID: HDL 77 (H) LDL/HDL RATIO (L) (other values wnl)  TSH: wnl  FREE T4: wnl  VITAMIN D: wnl  UA W/ REFLEX CULTURE: wnl  MICROALBUMIN/CREATININE: wnl

## 2017-08-30 ENCOUNTER — Ambulatory Visit (HOSPITAL_COMMUNITY)
Admission: RE | Admit: 2017-08-30 | Discharge: 2017-08-30 | Disposition: A | Discharge status: Home / Self Care | Payer: Medicare Other | Source: Ambulatory Visit | Attending: Cardiovascular Disease | Admitting: Cardiovascular Disease

## 2017-08-30 ENCOUNTER — Ambulatory Visit (HOSPITAL_COMMUNITY): Admission: RE | Admit: 2017-08-30 | Payer: Medicare Other | Source: Ambulatory Visit

## 2017-08-30 DIAGNOSIS — R0789 Other chest pain: Secondary | ICD-10-CM | POA: Diagnosis not present

## 2017-08-30 DIAGNOSIS — I2584 Coronary atherosclerosis due to calcified coronary lesion: Secondary | ICD-10-CM | POA: Diagnosis not present

## 2017-08-30 DIAGNOSIS — I2510 Atherosclerotic heart disease of native coronary artery without angina pectoris: Secondary | ICD-10-CM

## 2017-08-30 DIAGNOSIS — R079 Chest pain, unspecified: Secondary | ICD-10-CM | POA: Diagnosis not present

## 2017-08-30 LAB — POCT I-STAT CREATININE: Creatinine, Ser: 0.8 mg/dL (ref 0.44–1.00)

## 2017-08-30 MED ORDER — IOPAMIDOL (ISOVUE-370) INJECTION 76%
INTRAVENOUS | Status: AC
  Administered 2017-08-30: 80 mL
  Filled 2017-08-30: qty 100

## 2017-08-30 MED ORDER — NITROGLYCERIN 0.4 MG SL SUBL
0.4000 mg | SUBLINGUAL_TABLET | Freq: Once | SUBLINGUAL | Status: AC
  Administered 2017-08-30: 0.4 mg via SUBLINGUAL

## 2017-08-30 MED ORDER — NITROGLYCERIN 0.4 MG SL SUBL
SUBLINGUAL_TABLET | SUBLINGUAL | Status: AC
  Filled 2017-08-30: qty 1

## 2017-08-31 ENCOUNTER — Encounter: Payer: Medicare Other | Admitting: Neurology

## 2017-09-20 ENCOUNTER — Other Ambulatory Visit: Payer: Self-pay

## 2017-09-20 ENCOUNTER — Emergency Department (HOSPITAL_COMMUNITY): Payer: Medicare Other

## 2017-09-20 ENCOUNTER — Encounter (HOSPITAL_COMMUNITY): Payer: Self-pay

## 2017-09-20 ENCOUNTER — Inpatient Hospital Stay (HOSPITAL_COMMUNITY): Payer: Medicare Other

## 2017-09-20 ENCOUNTER — Inpatient Hospital Stay (HOSPITAL_COMMUNITY)
Admission: EM | Admit: 2017-09-20 | Discharge: 2017-09-22 | DRG: 041 | Disposition: A | Discharge status: Home Health | Payer: Medicare Other | Attending: Neurology | Admitting: Neurology

## 2017-09-20 DIAGNOSIS — I340 Nonrheumatic mitral (valve) insufficiency: Secondary | ICD-10-CM | POA: Diagnosis not present

## 2017-09-20 DIAGNOSIS — R269 Unspecified abnormalities of gait and mobility: Secondary | ICD-10-CM | POA: Diagnosis not present

## 2017-09-20 DIAGNOSIS — G250 Essential tremor: Secondary | ICD-10-CM | POA: Diagnosis present

## 2017-09-20 DIAGNOSIS — I639 Cerebral infarction, unspecified: Secondary | ICD-10-CM | POA: Diagnosis not present

## 2017-09-20 DIAGNOSIS — R29702 NIHSS score 2: Secondary | ICD-10-CM | POA: Diagnosis present

## 2017-09-20 DIAGNOSIS — R29818 Other symptoms and signs involving the nervous system: Secondary | ICD-10-CM | POA: Diagnosis not present

## 2017-09-20 DIAGNOSIS — F419 Anxiety disorder, unspecified: Secondary | ICD-10-CM | POA: Diagnosis present

## 2017-09-20 DIAGNOSIS — R1310 Dysphagia, unspecified: Secondary | ICD-10-CM | POA: Diagnosis present

## 2017-09-20 DIAGNOSIS — Z87891 Personal history of nicotine dependence: Secondary | ICD-10-CM

## 2017-09-20 DIAGNOSIS — R471 Dysarthria and anarthria: Secondary | ICD-10-CM | POA: Diagnosis present

## 2017-09-20 DIAGNOSIS — E7800 Pure hypercholesterolemia, unspecified: Secondary | ICD-10-CM | POA: Diagnosis present

## 2017-09-20 DIAGNOSIS — Z79899 Other long term (current) drug therapy: Secondary | ICD-10-CM

## 2017-09-20 DIAGNOSIS — I6340 Cerebral infarction due to embolism of unspecified cerebral artery: Secondary | ICD-10-CM

## 2017-09-20 DIAGNOSIS — Z8673 Personal history of transient ischemic attack (TIA), and cerebral infarction without residual deficits: Secondary | ICD-10-CM

## 2017-09-20 DIAGNOSIS — Z885 Allergy status to narcotic agent status: Secondary | ICD-10-CM | POA: Diagnosis not present

## 2017-09-20 DIAGNOSIS — K589 Irritable bowel syndrome without diarrhea: Secondary | ICD-10-CM | POA: Diagnosis present

## 2017-09-20 DIAGNOSIS — G8191 Hemiplegia, unspecified affecting right dominant side: Secondary | ICD-10-CM | POA: Diagnosis present

## 2017-09-20 DIAGNOSIS — E039 Hypothyroidism, unspecified: Secondary | ICD-10-CM | POA: Diagnosis present

## 2017-09-20 DIAGNOSIS — Z7982 Long term (current) use of aspirin: Secondary | ICD-10-CM | POA: Diagnosis not present

## 2017-09-20 DIAGNOSIS — Z882 Allergy status to sulfonamides status: Secondary | ICD-10-CM | POA: Diagnosis not present

## 2017-09-20 DIAGNOSIS — I63412 Cerebral infarction due to embolism of left middle cerebral artery: Principal | ICD-10-CM | POA: Diagnosis present

## 2017-09-20 DIAGNOSIS — I10 Essential (primary) hypertension: Secondary | ICD-10-CM | POA: Diagnosis present

## 2017-09-20 DIAGNOSIS — R4701 Aphasia: Secondary | ICD-10-CM | POA: Diagnosis present

## 2017-09-20 DIAGNOSIS — I341 Nonrheumatic mitral (valve) prolapse: Secondary | ICD-10-CM | POA: Diagnosis not present

## 2017-09-20 DIAGNOSIS — I63512 Cerebral infarction due to unspecified occlusion or stenosis of left middle cerebral artery: Secondary | ICD-10-CM | POA: Diagnosis not present

## 2017-09-20 DIAGNOSIS — R531 Weakness: Secondary | ICD-10-CM | POA: Diagnosis not present

## 2017-09-20 DIAGNOSIS — Z888 Allergy status to other drugs, medicaments and biological substances status: Secondary | ICD-10-CM

## 2017-09-20 LAB — COMPREHENSIVE METABOLIC PANEL
ALT: 18 U/L (ref 14–54)
AST: 22 U/L (ref 15–41)
Albumin: 4.1 g/dL (ref 3.5–5.0)
Alkaline Phosphatase: 74 U/L (ref 38–126)
Anion gap: 8 (ref 5–15)
BUN: 10 mg/dL (ref 6–20)
CO2: 28 mmol/L (ref 22–32)
Calcium: 9.2 mg/dL (ref 8.9–10.3)
Chloride: 100 mmol/L — ABNORMAL LOW (ref 101–111)
Creatinine, Ser: 0.94 mg/dL (ref 0.44–1.00)
GFR calc Af Amer: >60 mL/min (ref >60)
GFR calc non Af Amer: 57 mL/min — ABNORMAL LOW (ref >60)
Glucose, Bld: 103 mg/dL — ABNORMAL HIGH (ref 65–99)
Potassium: 4.2 mmol/L (ref 3.5–5.1)
Sodium: 136 mmol/L (ref 135–145)
Total Bilirubin: 0.6 mg/dL (ref 0.3–1.2)
Total Protein: 6.9 g/dL (ref 6.5–8.1)

## 2017-09-20 LAB — I-STAT CHEM 8, ED
BUN: 13 mg/dL (ref 6–20)
Calcium, Ion: 1.17 mmol/L (ref 1.15–1.40)
Chloride: 99 mmol/L — ABNORMAL LOW (ref 101–111)
Creatinine, Ser: 0.9 mg/dL (ref 0.44–1.00)
Glucose, Bld: 100 mg/dL — ABNORMAL HIGH (ref 65–99)
HCT: 42 % (ref 36.0–46.0)
Hemoglobin: 14.3 g/dL (ref 12.0–15.0)
Potassium: 4.3 mmol/L (ref 3.5–5.1)
Sodium: 138 mmol/L (ref 135–145)
TCO2: 28 mmol/L (ref 22–32)

## 2017-09-20 LAB — CBC
HCT: 42.5 % (ref 36.0–46.0)
Hemoglobin: 14.3 g/dL (ref 12.0–15.0)
MCH: 31.6 pg (ref 26.0–34.0)
MCHC: 33.6 g/dL (ref 30.0–36.0)
MCV: 94 fL (ref 78.0–100.0)
Platelets: 260 10*3/uL (ref 150–400)
RBC: 4.52 MIL/uL (ref 3.87–5.11)
RDW: 13.5 % (ref 11.5–15.5)
WBC: 5.7 10*3/uL (ref 4.0–10.5)

## 2017-09-20 LAB — PROTIME-INR
INR: 0.95
Prothrombin Time: 12.6 seconds (ref 11.4–15.2)

## 2017-09-20 LAB — DIFFERENTIAL
Basophils Absolute: 0 10*3/uL (ref 0.0–0.1)
Basophils Relative: 0 %
Eosinophils Absolute: 0.2 10*3/uL (ref 0.0–0.7)
Eosinophils Relative: 3 %
Lymphocytes Relative: 28 %
Lymphs Abs: 1.6 10*3/uL (ref 0.7–4.0)
Monocytes Absolute: 0.6 10*3/uL (ref 0.1–1.0)
Monocytes Relative: 10 %
Neutro Abs: 3.4 10*3/uL (ref 1.7–7.7)
Neutrophils Relative %: 59 %

## 2017-09-20 LAB — I-STAT TROPONIN, ED: Troponin i, poc: 0 ng/mL (ref 0.00–0.08)

## 2017-09-20 LAB — APTT: aPTT: 27 seconds (ref 24–36)

## 2017-09-20 LAB — MRSA PCR SCREENING: MRSA by PCR: NEGATIVE

## 2017-09-20 MED ORDER — ACETAMINOPHEN 650 MG RE SUPP: 650.0000 mg | RECTAL | Status: DC | PRN

## 2017-09-20 MED ORDER — SODIUM CHLORIDE 0.9 % IV SOLN
INTRAVENOUS | Status: DC
  Administered 2017-09-20 – 2017-09-21 (×2): via INTRAVENOUS

## 2017-09-20 MED ORDER — IOPAMIDOL (ISOVUE-370) INJECTION 76%
INTRAVENOUS | Status: AC
  Administered 2017-09-20: 90 mL
  Filled 2017-09-20: qty 100

## 2017-09-20 MED ORDER — ACETAMINOPHEN 325 MG PO TABS
650.0000 mg | ORAL_TABLET | ORAL | Status: DC | PRN
  Administered 2017-09-21: 650 mg via ORAL
  Filled 2017-09-20: qty 2

## 2017-09-20 MED ORDER — ACETAMINOPHEN 160 MG/5ML PO SOLN: 650.0000 mg | ORAL | Status: DC | PRN

## 2017-09-20 MED ORDER — STROKE: EARLY STAGES OF RECOVERY BOOK
Freq: Once | Status: DC
  Filled 2017-09-20: qty 1

## 2017-09-20 MED ORDER — ALTEPLASE (STROKE) FULL DOSE INFUSION
0.9000 mg/kg | Freq: Once | INTRAVENOUS | Status: AC
  Administered 2017-09-20: 47 mg via INTRAVENOUS
  Filled 2017-09-20: qty 100

## 2017-09-20 MED ORDER — SENNOSIDES-DOCUSATE SODIUM 8.6-50 MG PO TABS: 1.0000 | ORAL_TABLET | Freq: Every evening | ORAL | Status: DC | PRN

## 2017-09-20 NOTE — ED Provider Notes (Signed)
[facility] EMERGENCY DEPARTMENT Provider Note   CSN: [id number] Arrival date & time: 09/20/17  1320   An emergency department physician performed an initial assessment on this suspected stroke patient at 1340.  History   Chief Complaint Chief Complaint  Patient presents with  . Aphasia  . Code Stroke    HPI Bianca Garcia is a 76 y.o. female.  HPI Patient presents with difficulty speaking and difficulty walking due to right-sided weakness starting at 10 AM today.  She has had similar episodes in the past.  Most recently yesterday which spontaneously resolved after a few minutes.  She denies any recent trauma.  No chest pain, abdominal pain, headache or neck pain. Past Medical History:  Diagnosis Date  . Anxiety   . Arthritis   . Biceps tendonitis 01/05/2013  . 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 Chest pain   . Dizziness   . HTN (hypertension)    no meds  . Hypercholesterolemia   . Hypothyroid   . Hypothyroidism   . IBS (irritable bowel syndrome)   . Impingement syndrome of right shoulder 01/05/2013  . MVP (mitral valve prolapse)    Hx of   . Normal coronary arteries    by heart Cath  . Osteopenia   . Right rotator cuff tear 01/05/2013  . Seasonal allergies   . Tremor     Patient Active Problem List   Diagnosis Date Noted  . 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 Paresthesia 08/01/2017  . Tremor 08/01/2017  . Anxiety 02/27/2016  . 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 Coronary artery calcification 02/20/2016  . Hypothyroid   . Right rotator cuff tear 01/05/2013  . Impingement syndrome of right shoulder 01/05/2013  . 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 Biceps tendonitis 01/05/2013  . Floater, vitreous 03/21/2012  . 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 HTN (hypertension) 12/22/2011  . Hyperlipidemia 12/22/2011    Past Surgical History:  Procedure Laterality Date  . ANTERIOR CERVICAL  DECOMP/DISCECTOMY FUSION  04/2015    Dr. Pamala Hurry, [place]  . CARDIAC CATHETERIZATION  2004   which revealed smooth and normal coronary arteries  . CARPAL TUNNEL RELEASE  2000   lt  . CATARACT EXTRACTION W/ INTRAOCULAR LENS  IMPLANT, BILATERAL  1/17   Dr. Lucita Ferrara  . COSMETIC SURGERY  2010   eyes-facial  . DENTAL SURGERY  1960  . LASIK    . SHOULDER ARTHROSCOPY WITH ROTATOR CUFF REPAIR Right 01/05/2013   Procedure: SHOULDER ARTHROSCOPY WITH ARTHROSCOPIC  ROTATOR CUFF REPAIR, ACROMIOPLASTY, EXTENSIVE DEBRIDEMENT;  Surgeon: Johnny Bridge, MD;  Location: [facility];  Service: Orthopedics;  Laterality: Right;  . TONSILLECTOMY    . TUBAL LIGATION      OB History    Gravida Para Term Preterm AB Living   2 2 2     2    SAB TAB Ectopic Multiple Live Births                   Home Medications    Prior to Admission medications   Medication Sig Start Date End Date Taking? Authorizing Provider  amLODipine (NORVASC) 5 MG tablet TAKE 1 TABLET(5 MG) BY MOUTH DAILY 11/01/16   Nahser, Wonda Cheng, MD  aspirin 81 MG tablet Take 81 mg by mouth at bedtime.     [provider]  Calcium Carbonate-Vitamin D (CALCIUM + D PO) Take by mouth daily.    [provider]  clonazePAM (KLONOPIN) 0.5 MG tablet Take 0.25 mg by mouth 2 (two) times daily as needed for anxiety.    [provider]  cycloSPORINE (RESTASIS) 0.05 % ophthalmic emulsion Place 1 drop into both eyes every 12 (twelve) hours.     [provider]  levothyroxine (SYNTHROID, LEVOTHROID) 88 MCG tablet Take 88 mcg by mouth daily.    [provider]  Multiple Vitamins-Minerals (PRESERVISION AREDS PO) Take 1 tablet by mouth 2 (two) times daily.     [provider]  omeprazole (PRILOSEC) 40 MG capsule Take 40 mg by mouth daily. 10/22/16   [provider]  propranolol (INDERAL) 40 MG tablet Take 1 tablet (40 mg total) 2 (two) times daily by mouth. 08/01/17   Marcial Pacas, MD    Propylene Glycol (SYSTANE BALANCE OP) Apply 1 drop to eye daily. Reported on 02/27/2016    [provider]  rosuvastatin (CRESTOR) 5 MG tablet Take 1 tablet (5 mg total) by mouth daily. 07/21/17 10/19/17  Nahser, Wonda Cheng, MD  sertraline (ZOLOFT) 25 MG tablet Take 25 mg daily by mouth.    [provider]    Family History Family History  Problem Relation Age of Onset  . Bladder Cancer Father   . Mitral valve prolapse Mother   . Cancer Maternal Grandfather     Social History Social History   Tobacco Use  . Smoking status: Former Smoker    Last attempt to quit: 01/05/1960    Years since quitting: 57.7  . Smokeless tobacco: Never Used  . Tobacco comment: Remote Hx  Substance Use Topics  . Alcohol use: No    Frequency: Never  . Drug use: No     Allergies   Fluticasone; Codeine; Lipitor [atorvastatin calcium]; Penicillins; and Sulfa antibiotics   Review of Systems Review of Systems  Constitutional: Negative for chills and fever.  HENT: Negative for ear pain and sore throat.   Eyes: Negative for pain and visual disturbance.  Respiratory: Negative for cough and shortness of breath.   Cardiovascular: Negative for chest pain and palpitations.  Gastrointestinal: Negative for abdominal pain and vomiting.  Genitourinary: Negative for dysuria and hematuria.  Musculoskeletal: Negative for arthralgias and back pain.  Skin: Negative for color change and rash.  Neurological: Positive for speech difficulty and weakness. Negative for seizures and syncope.  Psychiatric/Behavioral: The patient is nervous/anxious.   All other systems reviewed and are negative.    Physical Exam Updated Vital Signs BP (!) 131/110   Pulse 61   Temp 97.8 F (36.6 C)   Resp (!) 25   Ht 5' (1.524 m)   Wt 52.6 kg (116 lb)   LMP 09/28/1979   SpO2 100%   BMI 22.65 kg/m   Physical Exam  Constitutional: She is oriented to person, place, and time. She appears well-developed and  well-nourished.  HENT:  Head: Normocephalic and atraumatic.  Mouth/Throat: Oropharynx is clear and moist.  Eyes: EOM are normal. Pupils are equal, round, and reactive to light.  Neck: Normal range of motion. Neck supple.  Cardiovascular: Normal rate and regular rhythm. Exam reveals no gallop and no friction rub.  No murmur heard. Pulmonary/Chest: Effort normal and breath sounds normal.  Abdominal: Soft. Bowel sounds are normal. There is no tenderness. There is no rebound and no guarding.  Musculoskeletal: Normal range of motion. She exhibits no edema or tenderness.  Neurological: She is alert and oriented to person, place, and time.  Mild dysarthria.  Question mild right hip flexor  weakness compared to left.  Sensation intact.  Skin: Skin is warm and dry. No rash noted. No erythema.  Psychiatric: She has a normal mood and affect. Her behavior is normal.  Nursing note and vitals reviewed.    ED Treatments / Results  Labs (all labs ordered are listed, but only abnormal results are displayed) Labs Reviewed  COMPREHENSIVE METABOLIC PANEL - Abnormal; Notable for the following components:      Result Value   Chloride 100 (*)    Glucose, Bld 103 (*)    GFR calc non Af Amer 57 (*)    All other components within normal limits  I-STAT CHEM 8, ED - Abnormal; Notable for the following components:   Chloride 99 (*)    Glucose, Bld 100 (*)    All other components within normal limits  PROTIME-INR  APTT  CBC  DIFFERENTIAL  I-STAT TROPONIN, ED  CBG MONITORING, ED    EKG  EKG Interpretation None       Radiology Ct Angio Head W Or Wo Contrast  Result Date: 09/20/2017 CLINICAL DATA:  Right-sided weakness.  Last seen normal 1130 EXAM: CT ANGIOGRAPHY HEAD AND NECK CT PERFUSION BRAIN TECHNIQUE: Multidetector CT imaging of the head and neck was performed using the standard protocol during bolus administration of intravenous contrast. Multiplanar CT image reconstructions and MIPs were  obtained to evaluate the vascular anatomy. Carotid stenosis measurements (when applicable) are obtained utilizing NASCET criteria, using the distal internal carotid diameter as the denominator. Multiphase CT imaging of the brain was performed following IV bolus contrast injection. Subsequent parametric perfusion maps were calculated using RAPID software. CONTRAST:  9mL ISOVUE-370 IOPAMIDOL (ISOVUE-370) INJECTION 76% COMPARISON:  Head CT from earlier today. FINDINGS: CTA NECK FINDINGS Aortic arch: Atherosclerotic calcification. No acute finding. Three vessel branching. Right carotid system: Mild atheromatous plaque at the bifurcation. No stenosis or ulceration. Left carotid system: Moderate calcified plaque at the ICA bulb. No flow limiting stenosis or ulceration. Vertebral arteries: No proximal subclavian stenosis. Both vertebral arteries are tortuous and intermittently beaded appearing. No stenosis or ulceration. Negative for dissection. Skeleton: C5-6 and C6-7 ACDF with solid arthrodesis. Diffuse facet arthropathy and disc narrowing. Other neck: No incidental mass or inflammation. Upper chest: No acute finding. Review of the MIP images confirms the above findings CTA HEAD FINDINGS Anterior circulation: Mild atherosclerotic plaque on the carotid siphons. No stenosis or branch occlusion. Negative for aneurysm. Posterior circulation: Codominant vertebral arteries. Standard vertebrobasilar branching. Vessels are diffusely smooth and widely patent. Venous sinuses: Patent Anatomic variants: None significant Delayed phase: Not performed in the emergent setting. Review of the MIP images confirms the above findings CT Brain Perfusion Findings: CBF (<30%) Volume: 36mL Perfusion (Tmax>6.0s) volume: 47mL Prelim results were communicated to Dr. Rory Percy at 2:26 pmon 12/25/2018by text page via the [facility] messaging system. IMPRESSION: 1. No emergent large vessel occlusion. No infarct or penumbra by CT perfusion. 2. Atherosclerosis  without major vessel flow limiting stenosis in the head or neck. 3. 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 Electronically Signed   By: Monte Fantasia M.D.   On: 09/20/2017 14:29   Ct Angio Neck W Or Wo Contrast  Result Date: 09/20/2017 CLINICAL DATA:  Right-sided weakness.  Last seen normal 1130 EXAM: CT ANGIOGRAPHY HEAD AND NECK CT PERFUSION BRAIN TECHNIQUE: Multidetector CT imaging of the head and neck was performed using the standard protocol during bolus administration of intravenous contrast. Multiplanar CT image reconstructions and MIPs were obtained to evaluate the vascular anatomy. Carotid  stenosis measurements (when applicable) are obtained utilizing NASCET criteria, using the distal internal carotid diameter as the denominator. Multiphase CT imaging of the brain was performed following IV bolus contrast injection. Subsequent parametric perfusion maps were calculated using RAPID software. CONTRAST:  51mL ISOVUE-370 IOPAMIDOL (ISOVUE-370) INJECTION 76% COMPARISON:  Head CT from earlier today. FINDINGS: CTA NECK FINDINGS Aortic arch: Atherosclerotic calcification. No acute finding. Three vessel branching. Right carotid system: Mild atheromatous plaque at the bifurcation. No stenosis or ulceration. Left carotid system: Moderate calcified plaque at the ICA bulb. No flow limiting stenosis or ulceration. Vertebral arteries: No proximal subclavian stenosis. Both vertebral arteries are tortuous and intermittently beaded appearing. No stenosis or ulceration. Negative for dissection. Skeleton: C5-6 and C6-7 ACDF with solid arthrodesis. Diffuse facet arthropathy and disc narrowing. Other neck: No incidental mass or inflammation. Upper chest: No acute finding. Review of the MIP images confirms the above findings CTA HEAD FINDINGS Anterior circulation: Mild atherosclerotic plaque on the carotid siphons. No stenosis or branch occlusion. Negative for aneurysm. Posterior circulation: Codominant vertebral  arteries. Standard vertebrobasilar branching. Vessels are diffusely smooth and widely patent. Venous sinuses: Patent Anatomic variants: None significant Delayed phase: Not performed in the emergent setting. Review of the MIP images confirms the above findings CT Brain Perfusion Findings: CBF (<30%) Volume: 53mL Perfusion (Tmax>6.0s) volume: 63mL Prelim results were communicated to Dr. Rory Percy at 2:26 pmon 12/25/2018by text page via the [facility] messaging system. IMPRESSION: 1. No emergent large vessel occlusion. No infarct or penumbra by CT perfusion. 2. Atherosclerosis without major vessel flow limiting stenosis in the head or neck. 3. 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 Electronically Signed   By: Monte Fantasia M.D.   On: 09/20/2017 14:29   Ct Cerebral Perfusion W Contrast  Result Date: 09/20/2017 CLINICAL DATA:  Right-sided weakness.  Last seen normal 1130 EXAM: CT ANGIOGRAPHY HEAD AND NECK CT PERFUSION BRAIN TECHNIQUE: Multidetector CT imaging of the head and neck was performed using the standard protocol during bolus administration of intravenous contrast. Multiplanar CT image reconstructions and MIPs were obtained to evaluate the vascular anatomy. Carotid stenosis measurements (when applicable) are obtained utilizing NASCET criteria, using the distal internal carotid diameter as the denominator. Multiphase CT imaging of the brain was performed following IV bolus contrast injection. Subsequent parametric perfusion maps were calculated using RAPID software. CONTRAST:  56mL ISOVUE-370 IOPAMIDOL (ISOVUE-370) INJECTION 76% COMPARISON:  Head CT from earlier today. FINDINGS: CTA NECK FINDINGS Aortic arch: Atherosclerotic calcification. No acute finding. Three vessel branching. Right carotid system: Mild atheromatous plaque at the bifurcation. No stenosis or ulceration. Left carotid system: Moderate calcified plaque at the ICA bulb. No flow limiting stenosis or ulceration. Vertebral arteries: No proximal  subclavian stenosis. Both vertebral arteries are tortuous and intermittently beaded appearing. No stenosis or ulceration. Negative for dissection. Skeleton: C5-6 and C6-7 ACDF with solid arthrodesis. Diffuse facet arthropathy and disc narrowing. Other neck: No incidental mass or inflammation. Upper chest: No acute finding. Review of the MIP images confirms the above findings CTA HEAD FINDINGS Anterior circulation: Mild atherosclerotic plaque on the carotid siphons. No stenosis or branch occlusion. Negative for aneurysm. Posterior circulation: Codominant vertebral arteries. Standard vertebrobasilar branching. Vessels are diffusely smooth and widely patent. Venous sinuses: Patent Anatomic variants: None significant Delayed phase: Not performed in the emergent setting. Review of the MIP images confirms the above findings CT Brain Perfusion Findings: CBF (<30%) Volume: 84mL Perfusion (Tmax>6.0s) volume: 12mL Prelim results were communicated to Dr. Rory Percy at 2:26 pmon 12/25/2018by text page via  the [employer] system. IMPRESSION: 1. No emergent large vessel occlusion. No infarct or penumbra by CT perfusion. 2. Atherosclerosis without major vessel flow limiting stenosis in the head or neck. 3. 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 Electronically Signed   By: Monte Fantasia M.D.   On: 09/20/2017 14:29   Ct Head Code Stroke Wo Contrast  Result Date: 09/20/2017 CLINICAL DATA:  Code stroke. Right-sided weakness. Last seen normal 11:30 EXAM: CT HEAD WITHOUT CONTRAST TECHNIQUE: Contiguous axial images were obtained from the base of the skull through the vertex without intravenous contrast. COMPARISON:  Brain MRI 06/23/2017 FINDINGS: Brain: No evidence of acute infarction, hemorrhage, hydrocephalus, extra-axial collection or mass lesion/mass effect. Generalized atrophy. Vascular: No hyperdense vessel or unexpected calcification. Skull: Normal. Negative for fracture or focal lesion. Sinuses/Orbits: Negative  Other: These results were communicated to Dr. Rory Percy at 2:02 pmon 12/25/2018by text page via the [facility] messaging system. ASPECTS [facility] Stroke Program Early CT Score) - Ganglionic level infarction (caudate, lentiform nuclei, internal capsule, insula, M1-M3 cortex): 7 - Supraganglionic infarction (M4-M6 cortex): 3 Total score (0-10 with 10 being normal): 10 IMPRESSION: No acute finding.ASPECTS is 10. Electronically Signed   By: Monte Fantasia M.D.   On: 09/20/2017 14:04    Procedures Procedures (including critical care time)  Medications Ordered in ED Medications  alteplase (ACTIVASE) 1 mg/mL infusion 47 mg (47 mg Intravenous New Bag/Given 09/20/17 1401)   stroke: mapping our early stages of recovery book (not administered)  0.9 %  sodium chloride infusion (not administered)  acetaminophen (TYLENOL) tablet 650 mg (not administered)    Or  acetaminophen (TYLENOL) solution 650 mg (not administered)    Or  acetaminophen (TYLENOL) suppository 650 mg (not administered)  senna-docusate (Senokot-S) tablet 1 tablet (not administered)  iopamidol (ISOVUE-370) 76 % injection (90 mLs  Contrast Given 09/20/17 1400)   CRITICAL CARE Performed by: Julianne Rice Total critical care time: 25 minutes Critical care time was exclusive of separately billable procedures and treating other patients. Critical care was necessary to treat or prevent imminent or life-threatening deterioration. Critical care was time spent personally by me on the following activities: development of treatment plan with patient and/or surrogate as well as nursing, discussions with consultants, evaluation of patient's response to treatment, examination of patient, obtaining history from patient or surrogate, ordering and performing treatments and interventions, ordering and review of laboratory studies, ordering and review of radiographic studies, pulse oximetry and re-evaluation of patient's condition.  Initial Impression /  Assessment and Plan / ED Course  I have reviewed the triage vital signs and the nursing notes.  Pertinent labs & imaging results that were available during my care of the patient were reviewed by me and considered in my medical decision making (see chart for details).     Code stroke called in triage.  Seen by neuro hospitalist.  Hospitalist discussed TPA with patient and patient's husband.  Decision made to give TPA.  No hospitalist to admit to ICU.  Final Clinical Impressions(s) / ED Diagnoses   Final diagnoses:  Stroke (cerebrum) [facility])    ED Discharge Orders    None       Julianne Rice, MD 09/20/17 1444

## 2017-09-20 NOTE — Code Documentation (Signed)
Per Husband she was LKW at 10am this am.  She had an acute onset of difficulty walking with right weakness and difficulty with word finding.  Per husband she has a similar episode yesterday with some dizziness, but improved quickly.  Patient arrived via private vehicle at 1320.  Code Stroke was called at 1340.  Stat labs and head CT done.  Dr Rory Percy at bedside to assess patient.  NIHSS 2,  Mild aphasia and mild dysarthria.  2 PIVs placed.  TPA started at 1400.   Patient admitted to 10N22.

## 2017-09-20 NOTE — H&P (Signed)
STROKE H&P  CC: right sided weakness, word finding difficulty, slurred speech History is obtained from: patient, husband  HPI: Bianca Garcia is a 76 y.o. female with PMH of anxiety, HTN, HLD, hypothyroid, IBS, who was in usual state of heath, till 10am this morning when she had difficulty walking due to right sided weakness, and also problems with speech. She had a similar episode yesterday that lasted for a few minutes and spontaneously resolved.  She went to bed normal.  She woke up normal.  When they were getting ready to go sometimes, that is when the symptoms of the weakness and the speech problems which are word finding difficulty and slurred speech came about.  The symptoms did not resolve and she was brought into the hospital by personal vehicle for evaluation.  She was seen at the triage, a code stroke was activated from the ER triage at that time. The patient was seen and examined in the CT scanner. No preceding illnesses.  No chest pain nausea vomiting shortness of breath.  No fevers chills.  LKW: 10 AM on 09/20/2017 tpa given?:  Yes Premorbid modified Rankin scale (mRS): 0  ROS: A 14 point ROS was performed and is negative except as noted in the HPI.   Past Medical History:  Diagnosis Date  . Anxiety   . Arthritis   . Biceps tendonitis 01/05/2013  . 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 Chest pain   . Dizziness   . HTN (hypertension)    no meds  . Hypercholesterolemia   . Hypothyroid   . Hypothyroidism   . IBS (irritable bowel syndrome)   . Impingement syndrome of right shoulder 01/05/2013  . MVP (mitral valve prolapse)    Hx of   . Normal coronary arteries    by heart Cath  . Osteopenia   . Right rotator cuff tear 01/05/2013  . Seasonal allergies   . Tremor    Family History  Problem Relation Age of Onset  . Bladder Cancer Father   . Mitral valve prolapse Mother   . Cancer Maternal Grandfather    Social History:   reports that she quit smoking about 57 years  ago. she has never used smokeless tobacco. She reports that she does not drink alcohol or use drugs.  Medications  Current Facility-Administered Medications:  .   stroke: mapping our early stages of recovery book, , Does not apply, Once, Amie Portland, MD .  0.9 %  sodium chloride infusion, , Intravenous, Continuous, Amie Portland, MD .  acetaminophen (TYLENOL) tablet 650 mg, 650 mg, Oral, Q4H PRN **OR** acetaminophen (TYLENOL) solution 650 mg, 650 mg, Per Tube, Q4H PRN **OR** acetaminophen (TYLENOL) suppository 650 mg, 650 mg, Rectal, Q4H PRN, Amie Portland, MD .  alteplase (ACTIVASE) 1 mg/mL infusion 47 mg, 0.9 mg/kg, Intravenous, Once, Amie Portland, MD .  iopamidol (ISOVUE-370) 76 % injection, , , ,  .  senna-docusate (Senokot-S) tablet 1 tablet, 1 tablet, Oral, QHS PRN, Amie Portland, MD  Current Outpatient Medications:  .  amLODipine (NORVASC) 5 MG tablet, TAKE 1 TABLET(5 MG) BY MOUTH DAILY, Disp: 90 tablet, Rfl: 3 .  aspirin 81 MG tablet, Take 81 mg by mouth at bedtime. , Disp: , Rfl:  .  Calcium Carbonate-Vitamin D (CALCIUM + D PO), Take by mouth daily., Disp: , Rfl:  .  clonazePAM (KLONOPIN) 0.5 MG tablet, Take 0.25 mg by mouth 2 (two) times daily as needed for anxiety., Disp: , Rfl:  .  cycloSPORINE (RESTASIS) 0.05 %  ophthalmic emulsion, Place 1 drop into both eyes every 12 (twelve) hours. , Disp: , Rfl:  .  levothyroxine (SYNTHROID, LEVOTHROID) 88 MCG tablet, Take 88 mcg by mouth daily., Disp: , Rfl:  .  Multiple Vitamins-Minerals (PRESERVISION AREDS PO), Take 1 tablet by mouth 2 (two) times daily. , Disp: , Rfl:  .  omeprazole (PRILOSEC) 40 MG capsule, Take 40 mg by mouth daily., Disp: , Rfl: 3 .  propranolol (INDERAL) 40 MG tablet, Take 1 tablet (40 mg total) 2 (two) times daily by mouth., Disp: 60 tablet, Rfl: 11 .  Propylene Glycol (SYSTANE BALANCE OP), Apply 1 drop to eye daily. Reported on 02/27/2016, Disp: , Rfl:  .  rosuvastatin (CRESTOR) 5 MG tablet, Take 1 tablet (5 mg  total) by mouth daily., Disp: 90 tablet, Rfl: 3 .  sertraline (ZOLOFT) 25 MG tablet, Take 25 mg daily by mouth., Disp: , Rfl:   Exam: Current vital signs: Ht 5' (1.524 m)   Wt 52.6 kg (116 lb)   LMP 09/28/1979   BMI 22.65 kg/m  Vital signs in last 24 hours: Weight:  [51.7 kg (114 lb)-52.6 kg (116 lb)] 52.6 kg (116 lb) (12/25 1400)  GENERAL: Awake, alert in NAD HEENT: - Normocephalic and atraumatic, dry mm, no LN++, no Thyromegally LUNGS - Clear to auscultation bilaterally with no wheezes CV - S1S2 RRR, no m/r/g, equal pulses bilaterally. ABDOMEN - Soft, nontender, nondistended with normoactive BS Ext: warm, well perfused, intact peripheral pulses, no edema  NEURO:  Mental Status: AA&Ox3  Language: speech is mildly dysarthric.  Naming, fluency, and comprehension intact but she does have problem with repetition as well as during normal conversation she did have some problems with word finding. Cranial Nerves: PERRL. EOMI, visual fields full, no facial asymmetry, facial sensation intact, hearing intact, tongue/uvula/soft palate midline, normal sternocleidomastoid and trapezius muscle strength. No evidence of tongue atrophy or fibrillations Motor: 5/5 in all extremities with normal tone normal range of motion with a fine tremor in both outstretched hands Sensation- Intact to light touch bilaterally, no extinction Coordination: FTN intact bilaterally, no ataxia in BLE. Gait- deferred  NIHSS - 2   Labs I have reviewed labs in epic and the results pertinent to this consultation are:  CBC    Component Value Date/Time   WBC 5.7 09/20/2017 1336   RBC 4.52 09/20/2017 1336   HGB 14.3 09/20/2017 1356   HCT 42.0 09/20/2017 1356   PLT 260 09/20/2017 1336   MCV 94.0 09/20/2017 1336   MCH 31.6 09/20/2017 1336   MCHC 33.6 09/20/2017 1336   RDW 13.5 09/20/2017 1336   LYMPHSABS 1.6 09/20/2017 1336   MONOABS 0.6 09/20/2017 1336   EOSABS 0.2 09/20/2017 1336   BASOSABS 0.0 09/20/2017 1336     CMP     Component Value Date/Time   NA 138 09/20/2017 1356   NA 139 08/01/2017 1112   K 4.3 09/20/2017 1356   CL 99 (L) 09/20/2017 1356   CO2 26 08/01/2017 1112   GLUCOSE 100 (H) 09/20/2017 1356   BUN 13 09/20/2017 1356   BUN 14 08/01/2017 1112   CREATININE 0.90 09/20/2017 1356   CREATININE 0.72 06/03/2016 0905   CALCIUM 9.5 08/01/2017 1112   PROT 7.2 08/01/2017 1507   ALBUMIN 4.1 06/03/2016 0905   AST 19 06/03/2016 0905   ALT 17 06/03/2016 0905   ALKPHOS 72 06/03/2016 0905   BILITOT 0.5 06/03/2016 0905   GFRNONAA 69 08/01/2017 1112   GFRAA 80 08/01/2017 1112  Imaging I have reviewed the images obtained:  CT-scan of the brain showed no acute changes.  Aspects 10. CT perfusion showed no abnormality. CT angiogram head and neck pending at this time.  Assessment:  76 year old woman with a past medical history of specified above who had an episode of right-sided weakness and word finding difficulty yesterday that resolved spontaneously and then with the last known normal being 10 AM, the episode happened again and persisted until her presentation to the hospital.  She was within the 4-1/2-hour window for IV TPA, which was given at 1400 hrs. on 09/20/2017. No evidence of large vessel occlusion based on CT perfusion study. Most likely a small embolic stroke in the left MCA territory.  Impression: Acute ischemic stroke-atheroembolic versus cardio embolic Cerebral infarction due to embolism of left middle cerebral artery  Acuity: Acute Current Suspected Etiology: Continue Evaluation:  -Admit to: NICU -Hold Aspirin until 24 hour post tPA neuroimaging is stable and without evidence of bleeding -Blood pressure control, goal of SYS <180 -MRI/ECHO/A1C/Lipid panel. -Hyperglycemia management per SSI to maintain glucose 140-180mg /dL. -PT/OT/ST therapies and recommendations when able  CNS -Close neuro monitoring -PT OT  Dysarthria Dysphagia following cerebral infarction   -NPO until cleared by speech -ST  RESP Monitor clinically No active issues  CV Essential (primary) hypertension -Aggressive BP control, goal SBP < 180 -TTE -Telemetry for any evidence of arrhythmia  Hyperlipidemia, unspecified  - Statin for goal LDL < 70  HEME -Monitor -transfuse for hgb < 7 -PT INR in AM  ENDO -A1c -No active issues  GI/GU -Gentle hydration  ID Possible Aspiration PNA -CXR -NPO -Monitor  Prophylaxis DVT: SCDs- no antiplatelet or anticoag   GI: NA Bowel: Doc senna   Diet: NPO until cleared by bedside eval  Code Status: Full Code  THE FOLLOWING WERE PRESENT ON ADMISSION: CNS -  Acute Ischemic Stroke  Delays in process: arrival to code stroke activation. Code stroke to needle time 77min. Door to needle 14min  -- Amie Portland, MD [facility] [phone] If 7pm to 7am, please call on call as listed on AMION.    CRITICAL CARE Performed by: Amie Portland Total critical care time: 45 minutes Critical care time was exclusive of separately billable procedures and treating other patients. Critical care was necessary to treat or prevent imminent or life-threatening deterioration. Critical care was time spent personally by me on the following activities: development of treatment plan with patient and/or surrogate as well as nursing, discussions with consultants, evaluation of patient's response to treatment, examination of patient, obtaining history from patient or surrogate, ordering and performing treatments and interventions, ordering and review of laboratory studies, ordering and review of radiographic studies, pulse oximetry and re-evaluation of patient's condition.

## 2017-09-20 NOTE — ED Notes (Signed)
[facility] Called @ 1340/Code Stroke Social research officer, government

## 2017-09-20 NOTE — ED Triage Notes (Signed)
Pt had an episode of aphasia yesterday. The RN at [facility] checked pt out yesterday morning and she seemed to improve. Today at noon pt was having a hard time walking and getting her words out

## 2017-09-20 NOTE — ED Notes (Addendum)
TPA started at 1400, bolus is 5

## 2017-09-20 NOTE — ED Notes (Signed)
Rod Holler RN at pt bedside now.

## 2017-09-21 ENCOUNTER — Inpatient Hospital Stay (HOSPITAL_COMMUNITY): Payer: Medicare Other

## 2017-09-21 DIAGNOSIS — I63412 Cerebral infarction due to embolism of left middle cerebral artery: Secondary | ICD-10-CM

## 2017-09-21 DIAGNOSIS — I341 Nonrheumatic mitral (valve) prolapse: Secondary | ICD-10-CM

## 2017-09-21 LAB — ECHOCARDIOGRAM COMPLETE
E decel time: 215 msec
E/e' ratio: 11.33
FS: 30 % (ref 28–44)
Height: 60 in
IVS/LV PW RATIO, ED: 0.94
LA ID, A-P, ES: 34 mm
LA diam end sys: 34 mm
LA diam index: 2.27 cm/m2
LA vol A4C: 38.7 ml
LA vol index: 27.9 mL/m2
LA vol: 41.9 mL
LV E/e' medial: 11.33
LV E/e'average: 11.33
LV PW d: 8.19 mm — AB (ref 0.6–1.1)
LV e' LATERAL: 7.54 cm/s
LVOT SV: 63 mL
LVOT VTI: 27.7 cm
LVOT area: 2.27 cm2
LVOT diameter: 17 mm
LVOT peak grad rest: 5 mmHg
LVOT peak vel: 113 cm/s
Lateral S' vel: 11.3 cm/s
MV Dec: 215
MV Peak grad: 3 mmHg
MV pk A vel: 72.3 m/s
MV pk E vel: 85.4 m/s
RV sys press: 36 mmHg
Reg peak vel: 287 cm/s
TAPSE: 18.6 mm
TDI e' lateral: 7.54
TDI e' medial: 6.98
TR max vel: 287 cm/s
Weight: 1855.39 oz

## 2017-09-21 LAB — LIPID PANEL
Cholesterol: 151 mg/dL (ref 0–200)
HDL: 73 mg/dL (ref >40)
LDL Cholesterol: 71 mg/dL (ref 0–99)
Total CHOL/HDL Ratio: 2.1 RATIO
Triglycerides: 35 mg/dL (ref <150)
VLDL: 7 mg/dL (ref 0–40)

## 2017-09-21 LAB — URINALYSIS, ROUTINE W REFLEX MICROSCOPIC
Bilirubin Urine: NEGATIVE
Glucose, UA: NEGATIVE mg/dL
Hgb urine dipstick: NEGATIVE
Ketones, ur: NEGATIVE mg/dL
Nitrite: NEGATIVE
Protein, ur: NEGATIVE mg/dL
Specific Gravity, Urine: 1.006 (ref 1.005–1.030)
Squamous Epithelial / LPF: NONE SEEN
pH: 7 (ref 5.0–8.0)

## 2017-09-21 LAB — HEMOGLOBIN A1C
Hgb A1c MFr Bld: 5.6 % (ref 4.8–5.6)
Mean Plasma Glucose: 114.02 mg/dL

## 2017-09-21 LAB — PHOSPHORUS: Phosphorus: 4.5 mg/dL (ref 2.5–4.6)

## 2017-09-21 LAB — MAGNESIUM: Magnesium: 2.2 mg/dL (ref 1.7–2.4)

## 2017-09-21 MED ORDER — ROSUVASTATIN CALCIUM 5 MG PO TABS
5.0000 mg | ORAL_TABLET | Freq: Every day | ORAL | Status: DC
  Administered 2017-09-21 – 2017-09-22 (×2): 5 mg via ORAL
  Filled 2017-09-21 (×2): qty 1

## 2017-09-21 MED ORDER — AMLODIPINE BESYLATE 2.5 MG PO TABS
2.5000 mg | ORAL_TABLET | Freq: Every day | ORAL | Status: DC
  Administered 2017-09-21 – 2017-09-22 (×2): 2.5 mg via ORAL
  Filled 2017-09-21 (×2): qty 1

## 2017-09-21 MED ORDER — CLONAZEPAM 0.5 MG PO TABS: 0.5000 mg | ORAL_TABLET | Freq: Two times a day (BID) | ORAL | Status: DC | PRN

## 2017-09-21 MED ORDER — LEVOTHYROXINE SODIUM 88 MCG PO TABS
88.0000 ug | ORAL_TABLET | Freq: Every day | ORAL | Status: DC
  Administered 2017-09-21 – 2017-09-22 (×2): 88 ug via ORAL
  Filled 2017-09-21 (×2): qty 1

## 2017-09-21 MED ORDER — SERTRALINE HCL 50 MG PO TABS
25.0000 mg | ORAL_TABLET | Freq: Every day | ORAL | Status: DC
  Administered 2017-09-21: 25 mg via ORAL
  Filled 2017-09-21 (×2): qty 1

## 2017-09-21 NOTE — Evaluation (Signed)
Physical Therapy Evaluation Patient Details Name: Bianca Garcia MRN: [id number] DOB: 12-25-1940 Today's Date: 09/21/2017   History of Present Illness  76 y.o. female admitted on 09/20/17 for difficulty walking due to R sided weakness and slurred speech.  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 MRI pending.  Pt with significant PMH of essential tremor, R rotator cuff tear s/p repair 2014, MVP, HTN, dizziness, and anterior cervical decompression and fusion.     Clinical Impression  Pt is mildly unsteady on her feet, but generally mobilizes well with and without support of the IV pole and therapist.  She is more unsteady than her baseline and would therefore benefit from acute therapy and follow up therapy at discharge.   PT to follow acutely for deficits listed below.       Follow Up Recommendations Home health PT    Equipment Recommendations  None recommended by PT    Recommendations for Other Services   NA    Precautions / Restrictions Precautions Precautions: Fall Precaution Comments: pt is milldy unstable on her feet Restrictions Weight Bearing Restrictions: No      Mobility  Bed Mobility Overal bed mobility: Needs Assistance Bed Mobility: Supine to Sit;Sit to Supine     Supine to sit: Supervision;HOB elevated Sit to supine: Supervision   General bed mobility comments: supervision for safety  Transfers Overall transfer level: Needs assistance Equipment used: None Transfers: Sit to/from Stand Sit to Stand: Min guard         General transfer comment: Min Guard for safety  Ambulation/Gait Ambulation/Gait assistance: Min guard Ambulation Distance (Feet): 300 Feet Assistive device: 1 person hand held assist;None(IV pole) Gait Pattern/deviations: Step-through pattern;Staggering right;Staggering left     General Gait Details: Mildly staggering gait pattern requiring min guard assist for safety with min hand held assist, IV pole or  nothing.        Modified Rankin (Stroke Patients Only) Modified Rankin (Stroke Patients Only) Pre-Morbid Rankin Score: No symptoms Modified Rankin: Moderately severe disability     Balance Overall balance assessment: Needs assistance Sitting-balance support: No upper extremity supported;Feet supported Sitting balance-Leahy Scale: Fair     Standing balance support: No upper extremity supported;During functional activity Standing balance-Leahy Scale: Fair                               Pertinent Vitals/Pain Pain Assessment: No/denies pain    Home Living Family/patient expects to be discharged to:: Private residence(independent living [facility] with her husband) Living Arrangements: Spouse/significant other;Other (Comment)(husband can likely only provide supervision) Available Help at Discharge: Family;Available 24 hours/day(husband supervision) Type of Home: Independent living facility Home Access: Level entry     Home Layout: One level Home Equipment: Walker - 2 wheels(possibly in the storage room)      Prior Function Level of Independence: Independent         Comments: still drives, likes to play Bridge, 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 )    Cervical / Trunk Assessment Cervical / Trunk Assessment: Other exceptions Cervical / Trunk Exceptions: h/o ACDF  Communication   Communication: No difficulties  Cognition Arousal/Alertness: Awake/alert Behavior During Therapy: [facility]  for tasks assessed/performed Overall Cognitive Status: No family/caregiver present to determine baseline cognitive functioning                                 General Comments: Seems to have  some memory deficits, such as stating that she had been at [facility] since December 1st then saying she came in December 26 (today).              Assessment/Plan    PT Assessment Patient needs continued PT services  PT Problem List Decreased activity tolerance;Decreased balance;Decreased mobility       PT Treatment Interventions DME instruction;Gait training;Functional mobility training;Therapeutic activities;Therapeutic exercise;Balance training;Neuromuscular re-education;Patient/family education    PT Goals (Current goals can be found in the Care Plan section)  Acute Rehab PT Goals Patient Stated Goal: Go home PT Goal Formulation: With patient Time For Goal Achievement: 10/05/17 Potential to Achieve Goals: Good    Frequency Min 4X/week    AM-PAC PT "6 Clicks" Daily Activity  Outcome Measure Difficulty turning over in bed (including adjusting bedclothes, sheets and blankets)?: None Difficulty moving from lying on back to sitting on the side of the bed? : None Difficulty sitting down on and standing up from a chair with arms (e.g., wheelchair, bedside commode, etc,.)?: Unable Help needed moving to and from a bed to chair (including a wheelchair)?: A Little Help needed walking in hospital room?: A Little Help needed climbing 3-5 steps with a railing? : A Little 6 Click Score: 18    End of Session Equipment Utilized During Treatment: Gait belt Activity Tolerance: Patient tolerated treatment well Patient left: in chair;with call bell/phone within reach;with chair alarm set Nurse Communication: Mobility status PT Visit Diagnosis: Unsteadiness on feet (R26.81);Other symptoms and signs involving the nervous system (L89.211)    Time: 9417-4081 PT Time Calculation (min) (ACUTE ONLY): 22 min   Charges:        Wells Guiles B. Kalin Amrhein, PT, DPT [phone]   PT Evaluation $PT Eval Moderate Complexity: 1 Mod     09/21/2017, 9:59 PM

## 2017-09-21 NOTE — Evaluation (Signed)
Speech Language Pathology Evaluation Patient Details Name: Bianca Garcia MRN: [id number] DOB: 01-01-1941 Today's Date: 09/21/2017 Time: 7425-9563 SLP Time Calculation (min) (ACUTE ONLY): 28 min  Problem List:  Patient Active Problem List   Diagnosis Date Noted  . 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 Stroke (cerebrum) ([place]) 09/20/2017  . Paresthesia 08/01/2017  . Tremor 08/01/2017  . Anxiety 02/27/2016  . 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 Coronary artery calcification 02/20/2016  . Hypothyroid   . Right rotator cuff tear 01/05/2013  . Impingement syndrome of right shoulder 01/05/2013  . 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 Biceps tendonitis 01/05/2013  . Floater, vitreous 03/21/2012  . 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 HTN (hypertension) 12/22/2011  . Hyperlipidemia 12/22/2011   Past Medical History:  Past Medical History:  Diagnosis Date  . Anxiety   . Arthritis   . Biceps tendonitis 01/05/2013  . 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 Chest pain   . Dizziness   . HTN (hypertension)    no meds  . Hypercholesterolemia   . Hypothyroid   . Hypothyroidism   . IBS (irritable bowel syndrome)   . Impingement syndrome of right shoulder 01/05/2013  . MVP (mitral valve prolapse)    Hx of   . Normal coronary arteries    by heart Cath  . Osteopenia   . Right rotator cuff tear 01/05/2013  . Seasonal allergies   . Tremor    Past Surgical History:  Past Surgical History:  Procedure Laterality Date  . ANTERIOR CERVICAL DECOMP/DISCECTOMY FUSION  04/2015    Dr. Pamala Hurry, [place]  . CARDIAC CATHETERIZATION  2004   which revealed smooth and normal coronary arteries  . CARPAL TUNNEL RELEASE  2000   lt  . CATARACT EXTRACTION W/ INTRAOCULAR LENS  IMPLANT, BILATERAL  1/17   Dr. Lucita Ferrara  . COSMETIC SURGERY  2010   eyes-facial  . DENTAL SURGERY  1960  . LASIK    . SHOULDER ARTHROSCOPY WITH ROTATOR CUFF REPAIR Right 01/05/2013   Procedure: SHOULDER ARTHROSCOPY WITH  ARTHROSCOPIC  ROTATOR CUFF REPAIR, ACROMIOPLASTY, EXTENSIVE DEBRIDEMENT;  Surgeon: Johnny Bridge, MD;  Location: [facility];  Service: Orthopedics;  Laterality: Right;  . TONSILLECTOMY    . TUBAL LIGATION     HPI:  76 y.o. female admitted on 09/20/17 for difficulty walking due to R sided weakness and slurred speech.  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 MRI pending.  Pt with significant PMH of essential tremor, R rotator cuff tear s/p repair 2014, MVP, HTN, dizziness, and anterior cervical decompression and fusion.   Assessment / Plan / Recommendation Clinical Impression  Pt presents with suspected mild deficits in cognitive linguistics following a "likely a small embolic stroke in the left MCA territory" per MD notes. Pt states new difficulty with "ability to think". Pt is conversational with her speech however does have some word finding difficulties at times. Informal cognitive assessment given revealing deficits in executive function, novel recall, organization, and speed of processing. Pt with some disorientation at times, thinking she was in [facility] and disoriented to day of the month. Continued ST intervention indicated for cogntiive linguistics to facilitate return to baseline as PLOF includes independence with all ADLs. Pt will have 24 hour care avaliable at discharge with her spouse and sons.     SLP Assessment  SLP Recommendation/Assessment: Patient needs continued Speech Lanaguage Pathology Services SLP Visit Diagnosis: Cognitive communication deficit (R41.841)    Follow Up  Recommendations  24 hour supervision/assistance    Frequency and Duration min 1 x/week  1 week      SLP Evaluation Cognition  Overall Cognitive Status: No family/caregiver present to determine baseline cognitive functioning Arousal/Alertness: Awake/alert Orientation Level: Oriented X4 Memory: Impaired Awareness: (fluctuating ) Executive Function:  Sequencing;Organizing Sequencing: Impaired Sequencing Impairment: Verbal complex;Functional complex Organizing: Impaired       Comprehension  Auditory Comprehension Overall Auditory Comprehension: Appears within functional limits for tasks assessed    Expression Expression Primary Mode of Expression: Verbal Verbal Expression Overall Verbal Expression: Impaired Initiation: No impairment Written Expression Dominant Hand: Left   Oral / Motor  Oral Motor/Sensory Function Overall Oral Motor/Sensory Function: Within functional limits Motor Speech Overall Motor Speech: Appears within functional limits for tasks assessed   GO                   Jannette Spanner MA, CCC-SLP Acute Care Speech Language Pathologist    Victorino Fatzinger E Neysha Criado 09/21/2017, 4:08 PM

## 2017-09-21 NOTE — Progress Notes (Signed)
NEUROHOSPITALISTS STROKE TEAM - DAILY PROGRESS NOTE   ADMISSION HISTORY:  Bianca Garcia is a 76 y.o. female with PMH of anxiety, HTN, HLD, hypothyroid, IBS, who was in usual state of heath, till 10am this morning when she had difficulty walking due to right sided weakness, and also problems with speech. She had a similar episode yesterday that lasted for a few minutes and spontaneously resolved.  She went to bed normal.  She woke up normal.  When they were getting ready to go sometimes, that is when the symptoms of the weakness and the speech problems which are word finding difficulty and slurred speech came about.  The symptoms did not resolve and she was brought into the hospital by personal vehicle for evaluation.  She was seen at the triage, a code stroke was activated from the ER triage at that time. The patient was seen and examined in the CT scanner. No preceding illnesses.  No chest pain nausea vomiting shortness of breath.  No fevers chills.  LKW: 10 AM on 09/20/2017 tpa given?:  Yes Premorbid modified Rankin scale (mRS): 0 NIHSS - 2  SUBJECTIVE (INTERVAL HISTORY) No family is at the bedside. Patient is found laying in bed in NAD. Overall she feels her condition is completely resolved. Voices no new complaints. No new events reported overnight.  Patient describes a 2-year history of bilateral hand tremors right greater than left that improves after alcohol.  She has not seen a neurologist for the symptoms.  OBJECTIVE Lab Results: CBC:  Recent Labs  Lab 09/20/17 1336 09/20/17 1356  WBC 5.7  --   HGB 14.3 14.3  HCT 42.5 42.0  MCV 94.0  --   PLT 260  --    BMP: Recent Labs  Lab 09/20/17 1336 09/20/17 1356 09/21/17 0250  NA 136 138  --   K 4.2 4.3  --   CL 100* 99*  --   CO2 28  --   --   GLUCOSE 103* 100*  --   BUN 10 13  --   CREATININE 0.94 0.90  --   CALCIUM 9.2  --   --   MG  --   --  2.2  PHOS  --   --   4.5   Liver Function Tests:  Recent Labs  Lab 09/20/17 1336  AST 22  ALT 18  ALKPHOS 74  BILITOT 0.6  PROT 6.9  ALBUMIN 4.1   Coagulation Studies:  Recent Labs    09/20/17 1336  APTT 27  INR 0.95   PHYSICAL EXAM Temp:  [97.6 F (36.4 C)-99.1 F (37.3 C)] 97.6 F (36.4 C) (12/26 0800) Pulse Rate:  [52-158] 67 (12/26 1100) Resp:  [11-25] 17 (12/26 1100) BP: (100-190)/(64-174) 138/75 (12/26 1100) SpO2:  [94 %-100 %] 100 % (12/26 1100) Weight:  [51.7 kg (114 lb)-52.6 kg (116 lb)] 52.6 kg (115 lb 15.4 oz) (12/25 1504) General - Well nourished, well developed, in no apparent distress Respiratory - Lungs clear bilaterally. No wheezing. Cardiovascular - Regular rate and rhythm, occasional PVCs on cardiac monitoring  NEURO:  Mental Status: AA&Ox3  Language: speech is mildly dysarthric.  Naming, fluency, and comprehension intact, some mild problems with repetition and word finding. Cranial Nerves: PERRL. EOMI, visual fields full, no facial asymmetry, facial sensation intact, hearing intact, tongue/uvula/soft palate midline, normal sternocleidomastoid and trapezius muscle strength. No evidence of tongue atrophy or fibrillations Motor: 5/5 in all extremities with normal tone normal range of motion with a fine  tremor in both outstretched hands, worse in right hand.  This is a baseline finding Sensation- Intact to light touch bilaterally, no extinction Coordination: FTN intact bilaterally, no ataxia in BLE. Gait- deferred  IMAGING: I have personally reviewed the radiological images below and agree with the radiology interpretations. Ct Angio Head and neck and perfusion W Or Wo Contrast Result Date: 09/20/2017 IMPRESSION: 1. No emergent large vessel occlusion. No infarct or penumbra by CT perfusion. 2. Atherosclerosis without major vessel flow limiting stenosis in the head or neck. 3. 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  Moderate calcified plaque at the LICA bulb. No  flow limiting stenosis or ulceration. Mild atheromatous plaque at the bifurcation RICA, No stenosis or ulceration.  Dg Chest 2 View Result Date: 09/20/2017 IMPRESSION: No active cardiopulmonary disease.  Ct Head Code Stroke Wo Contrast Result Date: 09/20/2017 IMPRESSION: No acute finding.ASPECTS is 10.   B/L Carotid U/S:  1-39% internal carotid artery stenosis bilaterally. Vertebral arteries are patent with antegrade flow.  Echocardiogram:   Completed and results pending MRI Head:               pending     ASSESSMENT: Ms. Bianca Garcia is a 76 y.o. female with PMH of anxiety, HTN, HLD, hypothyroid, IBS, who had an episode of right-sided weakness and word finding difficulty that resolved spontaneously. The episode happened again and persisted until her presentation to the hospital.  She was within the 4-1/2-hour window for IV TPA, which was given at 1400 hrs. on 09/20/2017. No evidence of large vessel occlusion based on CT perfusion study. Most likely a small embolic stroke in the left MCA territory.  STROKE: likely a small embolic stroke in the left MCA territory  Suspected Etiology: atheroembolic versus cardio embolic Resultant Symptoms: right-sided weakness and word finding difficulty  Stroke Risk Factors: hyperlipidemia, hypertension and Hypothyroidism Other Stroke Risk Factors: Advanced age, CAD  Outstanding Stroke Work-up Studies:     Echocardiogram:   Completed and results pending MRI Head:               pending  09/21/2017: Neuro exam stable.  Patient continues to have very mild dysarthria and intermittent word finding difficulty.  Much improved from yesterday's exam.  MRI pending.  Continue to hold antiplatelet until MRI resulted.  Carotid ultrasound negative for acute stenosis.  Transfer to 3 W. after 2 PM today.  Obtain TEE and loop recorder placement in a.m.   PLAN  09/21/2017: Transfer patient to 3 W. today after 2 PM Continue Statin HOLD ASA until 24 hour post  tPA neuroimaging is stable & without evidence of bleeding Frequent neuro checks Telemetry monitoring PT/OT/SLP Consult PM & Rehab Obtain TEE and Loop Recorder Placement in AM Ongoing aggressive stroke risk factor management Patient counseled to be compliant with herantithrombotic medications Patient counseled on Lifestyle modifications including, Diet, Exercise, and Stress Follow up with [facility] in 6 weeks  INTRACRANIAL/NECK Atherosclerosis &Stenosis:  Without major vessel flow limiting stenosis, carotid ultrasound negative May consider changing home aspirin to Plavix after MRI imaging  Rule out AFIB: Obtain TEE and Loop Recorder Placement in AM  MEDICAL ISSUES: Hypothyroidism Synthroid resumed  Anxiety/depression Zoloft resumed Klonopin resumed as needed  Possible UTI Urinalysis sent for evaluation  Benign essential tremor But no need for medication management at this time May follow-up with outpatient neurology  HYPERTENSION: Stable, Norvasc resumed at 2.5 mg daily SBP goal of < 180. DBP goal of < 105.  Labetolol  PRN Long term BP goal normotensive. Home Meds: Norvasc, Inderal  HYPERLIPIDEMIA:    Component Value Date/Time   CHOL 151 09/21/2017 0250   TRIG 35 09/21/2017 0250   HDL 73 09/21/2017 0250   CHOLHDL 2.1 09/21/2017 0250   VLDL 7 09/21/2017 0250   LDLCALC 71 09/21/2017 0250  Home Meds:  Crestor 5 mg LDL  goal < 70 Continued on Crestor 5 mg daily Continue statin at discharge  R/O DIABETES: Lab Results  Component Value Date   HGBA1C 5.6 09/21/2017  HgbA1c goal < 7.0  Other Active Problems: Active Problems:   Cerebral embolism with cerebral infarction   Stroke (cerebrum) [facility])  Hospital day # 1 VTE prophylaxis: SCD's  Diet : Diet Heart Room service appropriate? Yes; Fluid consistency: Thin   FAMILY UPDATES: No family at bedside  TEAM UPDATES: Garvin Fila, MD     Prior Home Stroke Medications:  aspirin 81 mg daily    Discharge Stroke Meds:  Please discharge patient on TBD   Disposition: 01-Home or Self Care Therapy Recs:               PENDING Home Equipment:         PENDING Follow Up:  Follow-up Information    Garvin Fila, MD. Schedule an appointment as soon as possible for a visit in 6 week(s).   Specialties:  Neurology, Radiology Contact information: [address] [place] [address] [phone]          Prince Solian, MD -PCP Follow up in 1-2 weeks     Renie Ora Stroke Neurology Team 09/21/2017 12:49 PM I have personally examined this patient, reviewed notes, independently viewed imaging studies, participated in medical decision making and plan of care.ROS completed by me personally and pertinent positives fully documented  I have made any additions or clarifications directly to the above note. Agree with note above. She has presented with expressive aphasia and right-sided weakness likely due to small left MCA embolic infarct source to be determined. She received IV tPA and has made near complete recovery. Maintain strict blood pressure control as per post TPA protocol and close neurological monitoring. Continue ongoing stroke workup. This patient is critically ill and at significant risk of neurological worsening, death and care requires constant monitoring of vital signs, hemodynamics,respiratory and cardiac monitoring, extensive review of multiple databases, frequent neurological assessment, discussion with family, other specialists and medical decision making of high complexity.I have made any additions or clarifications directly to the above note.This critical care time does not reflect procedure time, or teaching time or supervisory time of PA/NP/Med Resident etc but could involve care discussion time.  I spent 30 minutes of neurocritical care time  in the care of  this patient.    Antony Contras, MD Medical Director [facility] Pager:  [phone] 09/21/2017 2:00 PM  To contact Stroke Continuity provider, please refer to [email or web address] After hours, contact General Neurology

## 2017-09-21 NOTE — Progress Notes (Signed)
*  PRELIMINARY RESULTS* Vascular Ultrasound Carotid Duplex (Doppler) has been completed.  Findings suggest 1-39% internal carotid artery stenosis bilaterally. Vertebral arteries are patent with antegrade flow.  09/21/2017 12:15 PM Maudry Mayhew, BS, RVT, RDCS, RDMS

## 2017-09-21 NOTE — Progress Notes (Signed)
    [facility] has been requested to perform a transesophageal echocardiogram on 09/22/17 for CVA.  After careful review of history and examination, the risks and benefits of transesophageal echocardiogram have been explained including risks of esophageal damage, perforation (1:10,000 risk), bleeding, pharyngeal hematoma as well as other potential complications associated with conscious sedation including aspiration, arrhythmia, respiratory failure and death. Alternatives to treatment were discussed, questions were answered. Patient is willing to proceed. Labs and vital signs stable.   Reino Bellis, NP-C 09/21/2017 2:09 PM

## 2017-09-21 NOTE — Progress Notes (Signed)
  Echocardiogram 2D Echocardiogram has been performed.  Bianca Garcia 09/21/2017, 12:23 PM

## 2017-09-21 NOTE — Evaluation (Signed)
Occupational Therapy Evaluation Patient Details Name: Bianca Garcia MRN: [id number] DOB: 1940/09/30 Today's Date: 09/21/2017    History of Present Illness 76 y.o. female admitted on 09/20/17 for difficulty walking due to R sided weakness and slurred speech.  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 MRI pending.  Pt with significant PMH of essential tremor, R rotator cuff tear s/p repair 2014, MVP, HTN, dizziness, and anterior cervical decompression and fusion.      Clinical Impression   PTA, pt was independent and living with her husband at an ILF. Pt currently performing ADLs and functional mobility at [facility] level. Pt presenting with decreased balance and tremors in RUE. Pt would benefit form further acute OT to facilitate safe dc. Recommend dc home with HHOT to increase safety and independence with ADLs and functional mobility as well as decrease risk for falls.     Follow Up Recommendations  Home health OT;Supervision/Assistance - 24 hour    Equipment Recommendations  3 in 1 bedside commode    Recommendations for Other Services PT consult     Precautions / Restrictions Precautions Precautions: Fall Precaution Comments: pt is milldy unstable on her feet Restrictions Weight Bearing Restrictions: No      Mobility Bed Mobility Overal bed mobility: Needs Assistance Bed Mobility: Supine to Sit;Sit to Supine     Supine to sit: Supervision;HOB elevated Sit to supine: Supervision   General bed mobility comments: supervision for safety  Transfers Overall transfer level: Needs assistance Equipment used: None Transfers: Sit to/from Stand Sit to Stand: Min guard         General transfer comment: Min Guard for safety    Balance Overall balance assessment: Needs assistance Sitting-balance support: No upper extremity supported;Feet supported Sitting balance-Leahy Scale: Fair     Standing balance support: No upper extremity supported;During  functional activity Standing balance-Leahy Scale: Fair                             ADL either performed or assessed with clinical judgement   ADL Overall ADL's : Needs assistance/impaired Eating/Feeding: Set up;Sitting   Grooming: Oral care;Min guard;Standing   Upper Body Bathing: Set up;Sitting;Supervision/ safety   Lower Body Bathing: Min guard;Sit to/from stand   Upper Body Dressing : Set up;Supervision/safety;Sitting   Lower Body Dressing: Min guard;Sit to/from stand Lower Body Dressing Details (indicate cue type and reason): Pt able to adjust socks by bringing ankle to knees Toilet Transfer: Min guard;Ambulation;RW Toilet Transfer Details (indicate cue type and reason): Simulated in room         Functional mobility during ADLs: Min guard;Rolling walker General ADL Comments: Pt demonstrating near baseline funcitonal with deficits with balance and tremor in RUE. Pt reporting fatigue after performing grooming at sink     Vision Baseline Vision/History: Wears glasses Wears Glasses: Reading only Patient Visual Report: No change from baseline Vision Assessment?: Yes Eye Alignment: Within Functional Limits Ocular Range of Motion: Within Functional Limits Alignment/Gaze Preference: Within Defined Limits Tracking/Visual Pursuits: Able to track stimulus in all quads without difficulty Convergence: Within functional limits     Perception     Praxis      Pertinent Vitals/Pain Pain Assessment: No/denies pain     Hand Dominance Left   Extremity/Trunk Assessment Upper Extremity Assessment Upper Extremity Assessment: RUE deficits/detail RUE Deficits / Details: Tremors and weakness in RUE/hand. Pt with decreased grasp and slow finger opposition RUE Coordination: decreased  fine motor   Lower Extremity Assessment Lower Extremity Assessment: Defer to PT evaluation   Cervical / Trunk Assessment Cervical / Trunk Assessment: Other exceptions Cervical / Trunk  Exceptions: h/o ACDF   Communication Communication Communication: No difficulties   Cognition Arousal/Alertness: Awake/alert Behavior During Therapy: WFL for tasks assessed/performed Overall Cognitive Status: No family/caregiver present to determine baseline cognitive functioning                                 General Comments: Seems to have some memory deficits, such as stating that she had been at [facility] since December 1st then saying she came in December 26 (today).    General Comments       Exercises     Shoulder Instructions      Home Living Family/patient expects to be discharged to:: [facility] with [facility] husband) Living Arrangements: Spouse/significant other;Other (Comment)(husband can likely only provide supervision) Available Help at Discharge: Family;Available 24 hours/day(husband supervision) Type of Home: Independent living facility Home Access: Level entry     Home Layout: One level               Home Equipment: Walker - 2 wheels(possibly in the storage room)          Prior Functioning/Environment Level of Independence: Independent        Comments: still drives, likes to play Bridge, walk her dog Belle        OT Problem List: Decreased activity tolerance;Impaired balance (sitting and/or standing);Decreased knowledge of use of DME or AE;Impaired UE functional use      OT Treatment/Interventions: Self-care/ADL training;Therapeutic exercise;Energy conservation;DME and/or AE instruction;Therapeutic activities;Patient/family education    OT Goals(Current goals can be found in the care plan section) Acute Rehab OT Goals Patient Stated Goal: Go home OT Goal Formulation: With patient Time For Goal Achievement: 10/05/17 Potential to Achieve Goals: Good ADL Goals Pt Will Perform Grooming: with modified independence;standing Pt Will Perform Upper Body Dressing: with modified  independence;standing;sitting Pt Will Perform Lower Body Dressing: with modified independence;sit to/from stand Pt Will Transfer to Toilet: bedside commode;with modified independence;ambulating Pt Will Perform Toileting - Clothing Manipulation and hygiene: with modified independence;sit to/from stand  OT Frequency: Min 2X/week   Barriers to D/C:            Co-evaluation              AM-PAC PT "6 Clicks" Daily Activity     Outcome Measure Help from another person eating meals?: None Help from another person taking care of personal grooming?: A Little Help from another person toileting, which includes using toliet, bedpan, or urinal?: A Little Help from another person bathing (including washing, rinsing, drying)?: A Little Help from another person to put on and taking off regular upper body clothing?: A Little Help from another person to put on and taking off regular lower body clothing?: A Little 6 Click Score: 19   End of Session Equipment Utilized During Treatment: Gait belt;Rolling walker Nurse Communication: Mobility status  Activity Tolerance: Patient tolerated treatment well Patient left: in bed;with call bell/phone within reach;with bed alarm set  OT Visit Diagnosis: Unsteadiness on feet (R26.81);Other abnormalities of gait and mobility (R26.89);Muscle weakness (generalized) (M62.81)                Time: 6195-0932 OT Time Calculation (min): 23 min Charges:  OT General Charges $OT Visit: 1 Visit OT Evaluation $OT  Eval Moderate Complexity: 1 Mod OT Treatments $Self Care/Home Management : 8-22 mins G-Codes:     Bianca Garcia MSOT, OTR/L Acute Rehab Pager: [phone] Office: [place] 09/21/2017, 3:22 PM

## 2017-09-22 ENCOUNTER — Encounter (HOSPITAL_COMMUNITY)
Admission: EM | Disposition: A | Discharge status: Home Health | Payer: Self-pay | Source: Home / Self Care | Attending: Neurology

## 2017-09-22 ENCOUNTER — Inpatient Hospital Stay (HOSPITAL_COMMUNITY)
Admit: 2017-09-22 | Discharge: 2017-09-22 | Disposition: A | Discharge status: Home / Self Care | Payer: Medicare Other | Attending: Physician Assistant | Admitting: Physician Assistant

## 2017-09-22 ENCOUNTER — Inpatient Hospital Stay (HOSPITAL_COMMUNITY): Payer: Medicare Other

## 2017-09-22 ENCOUNTER — Encounter (HOSPITAL_COMMUNITY): Payer: Self-pay | Admitting: *Deleted

## 2017-09-22 DIAGNOSIS — I340 Nonrheumatic mitral (valve) insufficiency: Secondary | ICD-10-CM

## 2017-09-22 DIAGNOSIS — I63412 Cerebral infarction due to embolism of left middle cerebral artery: Principal | ICD-10-CM

## 2017-09-22 HISTORY — PX: TEE WITHOUT CARDIOVERSION: SHX5443

## 2017-09-22 HISTORY — PX: LOOP RECORDER INSERTION: EP1214

## 2017-09-22 LAB — VAS US CAROTID
LEFT ECA DIAS: -12 cm/s
LEFT VERTEBRAL DIAS: 11 cm/s
Left CCA dist dias: -22 cm/s
Left CCA dist sys: -68 cm/s
Left CCA prox dias: 20 cm/s
Left CCA prox sys: 75 cm/s
Left ICA dist dias: -41 cm/s
Left ICA dist sys: -123 cm/s
Left ICA prox dias: 31 cm/s
Left ICA prox sys: 83 cm/s
RIGHT ECA DIAS: -7 cm/s
RIGHT VERTEBRAL DIAS: 17 cm/s
Right CCA prox dias: 14 cm/s
Right CCA prox sys: 62 cm/s
Right cca dist sys: -109 cm/s

## 2017-09-22 SURGERY — ECHOCARDIOGRAM, TRANSESOPHAGEAL
Anesthesia: Moderate Sedation

## 2017-09-22 SURGERY — LOOP RECORDER INSERTION

## 2017-09-22 MED ORDER — BUTAMBEN-TETRACAINE-BENZOCAINE 2-2-14 % EX AERO
INHALATION_SPRAY | CUTANEOUS | Status: DC | PRN
  Administered 2017-09-22: 2 via TOPICAL

## 2017-09-22 MED ORDER — CLOPIDOGREL BISULFATE 75 MG PO TABS
75.0000 mg | ORAL_TABLET | Freq: Every day | ORAL | Status: DC
  Administered 2017-09-22: 75 mg via ORAL
  Filled 2017-09-22: qty 1

## 2017-09-22 MED ORDER — LIDOCAINE-EPINEPHRINE 1 %-1:100000 IJ SOLN
INTRAMUSCULAR | Status: AC
  Filled 2017-09-22: qty 1

## 2017-09-22 MED ORDER — CLOPIDOGREL BISULFATE 75 MG PO TABS: 75.0000 mg | ORAL_TABLET | Freq: Every day | ORAL | 1 refills | Status: DC

## 2017-09-22 MED ORDER — SODIUM CHLORIDE 0.9 % IV SOLN
INTRAVENOUS | Status: DC
  Administered 2017-09-22: 500 mL via INTRAVENOUS

## 2017-09-22 MED ORDER — MIDAZOLAM HCL 10 MG/2ML IJ SOLN
INTRAMUSCULAR | Status: DC | PRN
  Administered 2017-09-22 (×2): 2 mg via INTRAVENOUS
  Administered 2017-09-22 (×2): 1 mg via INTRAVENOUS

## 2017-09-22 MED ORDER — FENTANYL CITRATE (PF) 100 MCG/2ML IJ SOLN
INTRAMUSCULAR | Status: AC
  Filled 2017-09-22: qty 2

## 2017-09-22 MED ORDER — LIDOCAINE-EPINEPHRINE 1 %-1:100000 IJ SOLN
INTRAMUSCULAR | Status: DC | PRN
  Administered 2017-09-22: 20 mL

## 2017-09-22 MED ORDER — FENTANYL CITRATE (PF) 100 MCG/2ML IJ SOLN
INTRAMUSCULAR | Status: DC | PRN
  Administered 2017-09-22: 25 ug via INTRAVENOUS
  Administered 2017-09-22 (×2): 12.5 ug via INTRAVENOUS

## 2017-09-22 MED ORDER — SODIUM CHLORIDE 0.9 % IV SOLN: INTRAVENOUS | Status: DC

## 2017-09-22 MED ORDER — AMLODIPINE BESYLATE 5 MG PO TABS: 2.5000 mg | ORAL_TABLET | Freq: Every day | ORAL | 3 refills | Status: DC

## 2017-09-22 MED ORDER — MIDAZOLAM HCL 5 MG/ML IJ SOLN
INTRAMUSCULAR | Status: AC
  Filled 2017-09-22: qty 2

## 2017-09-22 SURGICAL SUPPLY — 2 items
LOOP REVEAL LINQSYS (Prosthesis & Implant Heart) ×2 IMPLANT
PACK LOOP INSERTION (CUSTOM PROCEDURE TRAY) ×2 IMPLANT

## 2017-09-22 NOTE — Consult Note (Signed)
ELECTROPHYSIOLOGY CONSULT NOTE  Patient ID: Bianca Garcia MRN: [id number], DOB/AGE: [birth year]   Admit date: 09/20/2017 Date of Consult: 09/22/2017  Primary Physician: Prince Solian, MD Primary Cardiologist: Dr. Rockie Neighbours Reason for Consultation: Cryptogenic stroke ; recommendations regarding Implantable Loop Recorder requested by Dr. Leonie Man  History of Present Illness Bianca Garcia was admitted on 09/20/2017 with CVA.  PMHx noted for HLD, HTN, hypothyridism.  They first developed symptoms of R sided weakness and difficult ambulation while at home, treated initially with tPA.  Imaging demonstrated likely a small embolic stroke in the left MCA territory.  she has undergone workup for stroke including echocardiogram and carotid angio.  The patient has been monitored on telemetry which has demonstrated sinus rhythm with no arrhythmias.  Inpatient stroke work-up was completed with a TEE.   TEE today  FINDINGS:  Normal TEE. No cardiac or aortic source of embolism was identified.  09/21/17: TTE Study Conclusions - Left ventricle: The cavity size was normal. Wall thickness was   normal. Systolic function was normal. The estimated ejection   fraction was in the range of 55% to 60%. Wall motion was normal;   there were no regional wall motion abnormalities. Features are   consistent with a pseudonormal left ventricular filling pattern,   with concomitant abnormal relaxation and increased filling   pressure (grade 2 diastolic dysfunction). - Aortic valve: There was trivial regurgitation. - Mitral valve: There was mild regurgitation. - Pulmonary arteries: Systolic pressure was mildly increased. PA   peak pressure: 36 mm Hg (S). Impressions: - Normal LV systolic function; moderate diastolic dysfunction;   trace AI; mild MR and TR; mildly elevated pulmonary pressure.   Lab work is reviewed.  Prior to admission, the patient denies any ongoing chest pain, shortness of breath,  dizziness, she feels her heart speed up when anxious or startled only, no palpitations otherwise, no syncope.  She is recovering from their stroke with plans to home at discharge.   Past Medical History:  Diagnosis Date  . Anxiety   . Arthritis   . Biceps tendonitis 01/05/2013  . 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 Chest pain   . Dizziness   . HTN (hypertension)    no meds  . Hypercholesterolemia   . Hypothyroid   . Hypothyroidism   . IBS (irritable bowel syndrome)   . Impingement syndrome of right shoulder 01/05/2013  . MVP (mitral valve prolapse)    Hx of   . Normal coronary arteries    by heart Cath  . Osteopenia   . Right rotator cuff tear 01/05/2013  . Seasonal allergies   . Tremor      Surgical History:  Past Surgical History:  Procedure Laterality Date  . ANTERIOR CERVICAL DECOMP/DISCECTOMY FUSION  04/2015    Dr. Pamala Hurry, [place]  . CARDIAC CATHETERIZATION  2004   which revealed smooth and normal coronary arteries  . CARPAL TUNNEL RELEASE  2000   lt  . CATARACT EXTRACTION W/ INTRAOCULAR LENS  IMPLANT, BILATERAL  1/17   Dr. Lucita Ferrara  . COSMETIC SURGERY  2010   eyes-facial  . DENTAL SURGERY  1960  . LASIK    . SHOULDER ARTHROSCOPY WITH ROTATOR CUFF REPAIR Right 01/05/2013   Procedure: SHOULDER ARTHROSCOPY WITH ARTHROSCOPIC  ROTATOR CUFF REPAIR, ACROMIOPLASTY, EXTENSIVE DEBRIDEMENT;  Surgeon: Johnny Bridge, MD;  Location: [facility];  Service: Orthopedics;  Laterality: Right;  . TONSILLECTOMY    . TUBAL LIGATION       Medications  Prior to Admission  Medication Sig Dispense Refill Last Dose  . amLODipine (NORVASC) 5 MG tablet TAKE 1 TABLET(5 MG) BY MOUTH DAILY 90 tablet 3 09/19/2017 at Unknown time  . aspirin 81 MG tablet Take 81 mg by mouth at bedtime.    09/19/2017 at Unknown time  . Calcium Carbonate-Vitamin D (CALCIUM + D PO) Take by mouth daily.   09/20/2017 at Unknown time  . clonazePAM (KLONOPIN) 0.5 MG tablet Take 0.25 mg by mouth 2 (two)  times daily as needed for anxiety.   prn  . levothyroxine (SYNTHROID, LEVOTHROID) 88 MCG tablet Take 88 mcg by mouth daily.   09/20/2017 at Unknown time  . Multiple Vitamins-Minerals (PRESERVISION AREDS PO) Take 1 tablet by mouth 2 (two) times daily.    09/20/2017 at Unknown time  . propranolol (INDERAL) 40 MG tablet Take 1 tablet (40 mg total) 2 (two) times daily by mouth. 60 tablet 11 09/19/2017 at 8pm  . Propylene Glycol (SYSTANE BALANCE OP) Apply 1 drop to eye daily. Reported on 02/27/2016   09/20/2017 at Unknown time  . rosuvastatin (CRESTOR) 5 MG tablet Take 1 tablet (5 mg total) by mouth daily. 90 tablet 3 09/19/2017 at Unknown time  . sertraline (ZOLOFT) 25 MG tablet Take 25 mg daily by mouth.   09/19/2017 at Unknown time    Inpatient Medications:  .  stroke: mapping our early stages of recovery book   Does not apply Once  . amLODipine  2.5 mg Oral Daily  . clopidogrel  75 mg Oral Daily  . levothyroxine  88 mcg Oral QAC breakfast  . rosuvastatin  5 mg Oral Daily  . sertraline  25 mg Oral Daily    Allergies:  Allergies  Allergen Reactions  . Fluticasone Other (See Comments)    DRY EYES  . Codeine   . Lipitor [Atorvastatin Calcium]     Intolerant   . Sulfa Antibiotics     vomiting    Social History   Socioeconomic History  . Marital status: Married    Spouse name: Not on file  . Number of children: 2  . Years of education: 16 years  . Highest education level: Bachelor's degree (e.g., BA, AB, BS)  Social Needs  . Financial resource strain: Not on file  . Food insecurity - worry: Not on file  . Food insecurity - inability: Not on file  . Transportation needs - medical: Not on file  . Transportation needs - non-medical: Not on file  Occupational History  . Occupation: Retired  Tobacco Use  . Smoking status: Former Smoker    Last attempt to quit: 01/05/1960    Years since quitting: 57.7  . Smokeless tobacco: Never Used  . Tobacco comment: Remote Hx  Substance and  Sexual Activity  . Alcohol use: No    Frequency: Never  . Drug use: No  . Sexual activity: No    Birth control/protection: Post-menopausal  Other Topics Concern  . Not on file  Social History Narrative   Lives at home with her husband.   Left-handed.   Occasional caffeine use.     Family History  Problem Relation Age of Onset  . Bladder Cancer Father   . Mitral valve prolapse Mother   . Cancer Maternal Grandfather       Review of Systems: All other systems reviewed and are otherwise negative except as noted above.  Physical Exam: Vitals:   09/22/17 1154 09/22/17 1200 09/22/17 1210 09/22/17 1212  BP: 137/76  Marland Kitchen)  147/60 138/79  Pulse: 70 71 73 69  Resp: 13 14 13 15   Temp: 97.9 F (36.6 C)     TempSrc: Oral     SpO2: 97% 96% 97% 97%  Weight:      Height:        GEN- The patient is well appearing, alert and oriented x 3 today.   Head- normocephalic, atraumatic Eyes-  Sclera clear, conjunctiva pink Ears- hearing intact Oropharynx- clear Neck- supple Lungs- CTA b/l, normal work of breathing Heart- RRR, no murmurs, rubs or gallops  GI- soft, NT, ND Extremities- no clubbing, cyanosis, or edema MS- no significant deformity, age appropriate atrophy Skin- no rash or lesion Psych- euthymic mood, full affect   Labs:   Lab Results  Component Value Date   WBC 5.7 09/20/2017   HGB 14.3 09/20/2017   HCT 42.0 09/20/2017   MCV 94.0 09/20/2017   PLT 260 09/20/2017    Recent Labs  Lab 09/20/17 1336 09/20/17 1356  NA 136 138  K 4.2 4.3  CL 100* 99*  CO2 28  --   BUN 10 13  CREATININE 0.94 0.90  CALCIUM 9.2  --   PROT 6.9  --   BILITOT 0.6  --   ALKPHOS 74  --   ALT 18  --   AST 22  --   GLUCOSE 103* 100*   Lab Results  Component Value Date   CKTOTAL 76 08/01/2017   Lab Results  Component Value Date   CHOL 151 09/21/2017   CHOL 168 06/03/2016   Lab Results  Component Value Date   HDL 73 09/21/2017   HDL 86 06/03/2016   Lab Results  Component  Value Date   LDLCALC 71 09/21/2017   LDLCALC 67 06/03/2016   Lab Results  Component Value Date   TRIG 35 09/21/2017   TRIG 77 06/03/2016   Lab Results  Component Value Date   CHOLHDL 2.1 09/21/2017   CHOLHDL 2.0 06/03/2016   No results found for: LDLDIRECT  No results found for: DDIMER   Radiology/Studies:   Ct Angio Head W Or Wo Contrast Result Date: 09/20/2017 CLINICAL DATA:  Right-sided weakness.  Last seen normal 1130 EXAM: CT ANGIOGRAPHY HEAD AND NECK CT PERFUSION BRAIN TECHNIQUE: Multidetector CT imaging of the head and neck was performed using the standard protocol during bolus administration of intravenous contrast. Multiplanar CT image reconstructions and MIPs were obtained to evaluate the vascular anatomy. Carotid stenosis measurements (when applicable) are obtained utilizing NASCET criteria, using the distal internal carotid diameter as the denominator. Multiphase CT imaging of the brain was performed following IV bolus contrast injection. Subsequent parametric perfusion maps were calculated using RAPID software. CONTRAST:  1mL ISOVUE-370 IOPAMIDOL (ISOVUE-370) INJECTION 76% COMPARISON:  Head CT from earlier today. FINDINGS: CTA NECK FINDINGS Aortic arch: Atherosclerotic calcification. No acute finding. Three vessel branching. Right carotid system: Mild atheromatous plaque at the bifurcation. No stenosis or ulceration. Left carotid system: Moderate calcified plaque at the ICA bulb. No flow limiting stenosis or ulceration. Vertebral arteries: No proximal subclavian stenosis. Both vertebral arteries are tortuous and intermittently beaded appearing. No stenosis or ulceration. Negative for dissection. Skeleton: C5-6 and C6-7 ACDF with solid arthrodesis. Diffuse facet arthropathy and disc narrowing. Other neck: No incidental mass or inflammation. Upper chest: No acute finding. Review of the MIP images confirms the above findings CTA HEAD FINDINGS Anterior circulation: Mild atherosclerotic  plaque on the carotid siphons. No stenosis or branch occlusion. Negative for aneurysm. Posterior circulation: Codominant  vertebral arteries. Standard vertebrobasilar branching. Vessels are diffusely smooth and widely patent. Venous sinuses: Patent Anatomic variants: None significant Delayed phase: Not performed in the emergent setting. Review of the MIP images confirms the above findings CT Brain Perfusion Findings: CBF (<30%) Volume: 8mL Perfusion (Tmax>6.0s) volume: 50mL Prelim results were communicated to Dr. Rory Percy at 2:26 pmon 12/25/2018by text page via the [facility] messaging system. IMPRESSION: 1. No emergent large vessel occlusion. No infarct or penumbra by CT perfusion. 2. Atherosclerosis without major vessel flow limiting stenosis in the head or neck. 3. 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 Electronically Signed   By: Monte Fantasia M.D.   On: 09/20/2017 14:29    Dg Chest 2 View Result Date: 09/20/2017 CLINICAL DATA:  Stroke EXAM: CHEST  2 VIEW COMPARISON:  07/20/2014 FINDINGS: There is no focal parenchymal opacity. There is no pleural effusion or pneumothorax. The heart and mediastinal contours are unremarkable. Mild osteoarthritis of bilateral glenohumeral joints. Left glenohumeral loose bodies. IMPRESSION: No active cardiopulmonary disease. Electronically Signed   By: Kathreen Devoid   On: 09/20/2017 14:58    Ct Head Wo Contrast Result Date: 09/21/2017 CLINICAL DATA:  Followup stroke. Acute presentation yesterday with right-sided weakness. EXAM: CT HEAD WITHOUT CONTRAST TECHNIQUE: Contiguous axial images were obtained from the base of the skull through the vertex without intravenous contrast. COMPARISON:  09/20/2017 FINDINGS: Brain: No change since yesterday. No sign of acute infarction, mass lesion, hemorrhage, hydrocephalus or extra-axial collection. Mild chronic small-vessel ischemic changes of the deep white matter. Vascular: There is atherosclerotic calcification of the major vessels  at the base of the brain. Skull: Normal Sinuses/Orbits: Clear/normal Other: None IMPRESSION: No change. No acute finding. Mild chronic small-vessel ischemic change of the hemispheric white matter. Electronically Signed   By: Nelson Chimes M.D.   On: 09/21/2017 14:55    Mr Brain Wo Contrast Result Date: 09/22/2017 CLINICAL DATA:  Acute onset of gait disturbance and right-sided weakness beginning 09/20/2017. EXAM: MRI HEAD WITHOUT CONTRAST TECHNIQUE: Multiplanar, multiecho pulse sequences of the brain and surrounding structures were obtained without intravenous contrast. COMPARISON:  Recent head CTs.  MRI 06/23/2017 FINDINGS: Brain: Diffusion imaging does not show any acute or subacute infarction. The brainstem and cerebellum are normal. Cerebral hemispheres show mild to moderate chronic appearing small vessel ischemic changes of the hemispheric white matter. No cortical or large vessel territory infarction. No mass lesion, hemorrhage, hydrocephalus or extra-axial collection. Vascular: Major vessels at the base of the brain show flow. Skull and upper cervical spine: Negative Sinuses/Orbits: Clear/normal Other: None IMPRESSION: No acute finding by MRI. Mild to moderate chronic small-vessel ischemic changes of the cerebral hemispheric white matter with central volume loss. Electronically Signed   By: Nelson Chimes M.D.   On: 09/22/2017 11:23    Ct Head Code Stroke Wo Contrast Result Date: 09/20/2017 CLINICAL DATA:  Code stroke. Right-sided weakness. Last seen normal 11:30 EXAM: CT HEAD WITHOUT CONTRAST TECHNIQUE: Contiguous axial images were obtained from the base of the skull through the vertex without intravenous contrast. COMPARISON:  Brain MRI 06/23/2017 FINDINGS: Brain: No evidence of acute infarction, hemorrhage, hydrocephalus, extra-axial collection or mass lesion/mass effect. Generalized atrophy. Vascular: No hyperdense vessel or unexpected calcification. Skull: Normal. Negative for fracture or focal  lesion. Sinuses/Orbits: Negative Other: These results were communicated to Dr. Rory Percy at 2:02 pmon 12/25/2018by text page via the [facility] messaging system. ASPECTS [facility] Stroke Program Early CT Score) - Ganglionic level infarction (caudate, lentiform nuclei, internal capsule, insula, M1-M3 cortex): 7 - Supraganglionic  infarction (M4-M6 cortex): 3 Total score (0-10 with 10 being normal): 10 IMPRESSION: No acute finding.ASPECTS is 10. Electronically Signed   By: Monte Fantasia M.D.   On: 09/20/2017 14:04    12-lead ECG SR only All prior EKG's in EPIC reviewed with no documented atrial fibrillation Telemetry SR only  Assessment and Plan:  1. Cryptogenic stroke The patient presents with cryptogenic stroke.  The patient has a TEE planned for this AM.  I spoke at length with the patient about monitoring for afib with either a 30 day event monitor or an implantable loop recorder.  Risks, benefits, and alteratives to implantable loop recorder were discussed with the patient today.   At this time, the patient is very clear in their decision to proceed with implantable loop recorder.   Wound care was reviewed with the patient (keep incision clean and dry for 3 days).  Wound check will be scheduled for the patient  Please call with questions.   Baldwin Jamaica, PA-C 09/22/2017  I have seen and examined this patient with Tommye Standard.  Agree with above, note added to reflect my findings.  On exam, RRR, no murmurs, lungs clear. Presented to the hospital with cryptogenic stroke. TEE negative. Plan for LINQ implant. Risks and benefits include bleeding and infection. The patient understands the risks and has agreed to the procedure.    Will M. Camnitz MD 09/22/2017 3:53 PM

## 2017-09-22 NOTE — Progress Notes (Addendum)
Pt discharge education and instructions completed with pt and spouse at bedside; both voices understanding and denies any questions. Pt IV and telemetry removed; pt discharge home with spouse to transport her home. Pt handed her prescriptions for Norvasc and Plavix. Pt home DME 3 in 1 equipment delivered to pt at bedside. Pt transported off unit via wheelchair with spouse and belongings to the side. Delia Heady RN

## 2017-09-22 NOTE — Progress Notes (Signed)
PT Cancellation Note  Patient Details Name: Bianca Garcia MRN: [id number] DOB: 1941-03-28   Cancelled Treatment:    Reason Eval/Treat Not Completed: Patient at procedure or test/unavailable Pt off floor in endo. Will follow.   Marguarite Arbour A Lupe Bonner 09/22/2017, 10:08 AM Wray Kearns, PT, DPT [phone]

## 2017-09-22 NOTE — Interval H&P Note (Signed)
History and Physical Interval Note:  09/22/2017 10:19 AM  Bianca Garcia  has presented today for surgery, with the diagnosis of STROKE  The various methods of treatment have been discussed with the patient and family. After consideration of risks, benefits and other options for treatment, the patient has consented to  Procedure(s): TRANSESOPHAGEAL ECHOCARDIOGRAM (TEE) (N/A) as a surgical intervention .  The patient's history has been reviewed, patient examined, no change in status, stable for surgery.  I have reviewed the patient's chart and labs.  Questions were answered to the patient's satisfaction.     Enos Muhl

## 2017-09-22 NOTE — Progress Notes (Signed)
OT Cancellation Note  Patient Details Name: Bianca Garcia MRN: [id number] DOB: [birth year]   Cancelled Treatment:    Reason Eval/Treat Not Completed: Patient at procedure or test/ unavailable.  [name] M.S., OTR/L Pager: [phone]  09/22/2017, 11:09 AM

## 2017-09-22 NOTE — Progress Notes (Signed)
Returned to unit at 12:45

## 2017-09-22 NOTE — Discharge Summary (Signed)
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 Anxiety   . Arthritis   . Biceps tendonitis 01/05/2013  . 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 Chest pain   . Dizziness   . HTN (hypertension)    no meds  . Hypercholesterolemia   . Hypothyroid   . Hypothyroidism   . IBS (irritable bowel syndrome)   . Impingement syndrome of right shoulder 01/05/2013  . MVP (mitral valve prolapse)    Hx of   . Normal coronary arteries    by heart Cath  . Osteopenia   . Right rotator cuff tear 01/05/2013  . Seasonal allergies   . Tremor    Past Surgical History:  Procedure Laterality Date  . ANTERIOR CERVICAL DECOMP/DISCECTOMY FUSION  04/2015    Dr. Pamala Hurry, [place]  . CARDIAC CATHETERIZATION  2004   which revealed smooth and normal coronary arteries  . CARPAL TUNNEL RELEASE  2000   lt  . CATARACT EXTRACTION W/ INTRAOCULAR LENS  IMPLANT, BILATERAL  1/17   Dr. Lucita Ferrara  . COSMETIC SURGERY  2010   eyes-facial  . DENTAL SURGERY  1960  . LASIK    . SHOULDER ARTHROSCOPY WITH ROTATOR CUFF REPAIR Right 01/05/2013   Procedure: SHOULDER ARTHROSCOPY WITH ARTHROSCOPIC  ROTATOR CUFF REPAIR, ACROMIOPLASTY, EXTENSIVE DEBRIDEMENT;  Surgeon: Johnny Bridge, MD;  Location: [place];  Service: Orthopedics;  Laterality: Right;  . TONSILLECTOMY    . TUBAL LIGATION      Allergies as of 09/22/2017      Reactions   Fluticasone Other (See Comments)   DRY EYES   Codeine    Lipitor [atorvastatin Calcium]     Intolerant    Sulfa Antibiotics    vomiting      Medication List    STOP taking these medications   aspirin 81 MG tablet   sertraline 25 MG tablet Commonly known as:  ZOLOFT     TAKE these medications   amLODipine 5 MG tablet Commonly known as:  NORVASC Take 0.5 tablets (2.5 mg total) by mouth daily. What changed:  See the new instructions.   CALCIUM + D PO Take by mouth daily.   clonazePAM 0.5 MG tablet Commonly known as:  KLONOPIN Take 0.25 mg by mouth 2 (two) times daily as needed for anxiety.   clopidogrel 75 MG tablet Commonly known as:  PLAVIX Take 1 tablet (75 mg total) by mouth daily. Start taking on:  09/23/2017   levothyroxine 88 MCG tablet Commonly known as:  SYNTHROID, LEVOTHROID Take 88 mcg by mouth daily.   PRESERVISION AREDS PO Take 1 tablet by mouth 2 (two) times daily.   propranolol 40 MG tablet Commonly known as:  INDERAL Take 1 tablet (40 mg total) 2 (two) times daily by mouth.   rosuvastatin 5 MG tablet Commonly known as:  CRESTOR Take 1 tablet (5 mg total) by mouth daily.   SYSTANE BALANCE OP Apply 1 drop to eye daily. Reported on 02/27/2016  Durable Medical Equipment  (From admission, onward)        Start     Ordered   09/22/17 1316  For home use only DME 3 n 1  Once     09/22/17 1316     LABORATORY STUDIES CBC    Component Value Date/Time   WBC 5.7 09/20/2017 1336   RBC 4.52 09/20/2017 1336   HGB 14.3 09/20/2017 1356   HCT 42.0 09/20/2017 1356   PLT 260 09/20/2017 1336   MCV 94.0 09/20/2017 1336   MCH 31.6 09/20/2017 1336   MCHC 33.6 09/20/2017 1336   RDW 13.5 09/20/2017 1336   LYMPHSABS 1.6 09/20/2017 1336   MONOABS 0.6 09/20/2017 1336   EOSABS 0.2 09/20/2017 1336   BASOSABS 0.0 09/20/2017 1336   CMP    Component Value Date/Time   NA 138 09/20/2017 1356   NA 139 08/01/2017 1112   K 4.3 09/20/2017 1356   CL 99 (L) 09/20/2017 1356   CO2 28 09/20/2017 1336   GLUCOSE 100 (H) 09/20/2017 1356   BUN  13 09/20/2017 1356   BUN 14 08/01/2017 1112   CREATININE 0.90 09/20/2017 1356   CREATININE 0.72 06/03/2016 0905   CALCIUM 9.2 09/20/2017 1336   PROT 6.9 09/20/2017 1336   PROT 7.2 08/01/2017 1507   ALBUMIN 4.1 09/20/2017 1336   AST 22 09/20/2017 1336   ALT 18 09/20/2017 1336   ALKPHOS 74 09/20/2017 1336   BILITOT 0.6 09/20/2017 1336   GFRNONAA 57 (L) 09/20/2017 1336   GFRAA >60 09/20/2017 1336   COAGS Lab Results  Component Value Date   INR 0.95 09/20/2017   Lipid Panel    Component Value Date/Time   CHOL 151 09/21/2017 0250   TRIG 35 09/21/2017 0250   HDL 73 09/21/2017 0250   CHOLHDL 2.1 09/21/2017 0250   VLDL 7 09/21/2017 0250   LDLCALC 71 09/21/2017 0250   HgbA1C  Lab Results  Component Value Date   HGBA1C 5.6 09/21/2017   Urinalysis    Component Value Date/Time   COLORURINE STRAW (A) 09/21/2017 1330   APPEARANCEUR CLEAR 09/21/2017 1330   LABSPEC 1.006 09/21/2017 1330   PHURINE 7.0 09/21/2017 1330   GLUCOSEU NEGATIVE 09/21/2017 1330   HGBUR NEGATIVE 09/21/2017 1330   BILIRUBINUR NEGATIVE 09/21/2017 1330   KETONESUR NEGATIVE 09/21/2017 1330   PROTEINUR NEGATIVE 09/21/2017 1330   NITRITE NEGATIVE 09/21/2017 1330   LEUKOCYTESUR MODERATE (A) 09/21/2017 1330   Urine Drug Screen No results found for: LABOPIA, COCAINSCRNUR, LABBENZ, AMPHETMU, THCU, LABBARB  Alcohol Level No results found for: [facility]  SIGNIFICANT DIAGNOSTIC STUDIES Ct Angio Head and neck and perfusion W Or Wo Contrast Result Date: 09/20/2017 IMPRESSION: 1. No emergent large vessel occlusion. No infarct or penumbra by CT perfusion. 2. Atherosclerosis without major vessel flow limiting stenosis in the head or neck. 3. 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  Moderate calcified plaque at the LICA bulb. No flow limiting stenosis or ulceration. Mild atheromatous plaque at the bifurcation RICA, No stenosis or ulceration.  Dg Chest 2 View Result Date: 09/20/2017 IMPRESSION: No active  cardiopulmonary disease.  Ct Head Code Stroke Wo Contrast Result Date: 09/20/2017 IMPRESSION: No acute finding.ASPECTS is 10.   B/L Carotid U/S:  1-39% internal carotid artery stenosis bilaterally. Vertebral arteries are patent with antegrade flow.  Echocardiogram: Study Conclusions - Left ventricle: The cavity size was normal. Wall thickness was   normal. Systolic function was normal. The estimated ejection   fraction was in the range of  55% to 60%. Wall motion was normal;   there were no regional wall motion abnormalities. Features are   consistent with a pseudonormal left ventricular filling pattern,   with concomitant abnormal relaxation and increased filling   pressure (grade 2 diastolic dysfunction). - Aortic valve: There was trivial regurgitation. - Mitral valve: There was mild regurgitation. - Pulmonary arteries: Systolic pressure was mildly increased. PA   peak pressure: 36 mm Hg (S). Impressions: - Normal LV systolic function; moderate diastolic dysfunction;   trace AI; mild MR and TR; mildly elevated pulmonary pressure  MRI Head:  IMPRESSION: No acute finding by MRI. Mild to moderate chronic small-vessel ischemic changes of the cerebral hemispheric white matter with central volume loss.  TEE: Normal TEE. No cardiac or aortic source of embolism was identified  [place] COURSE ASSESSMENT: Bianca Garcia is a 76 y.o. female with PMH of anxiety, HTN, HLD, hypothyroid, IBS, who had an episode of right-sided weakness and word finding difficulty that resolved spontaneously. The episode happened again and persisted until her presentation to the hospital. She was within the 4-1/2-hour window for IV TPA, which was given at 1400 hrs. on 09/20/2017. No evidence of large vessel occlusion based on CT perfusion study. Most likely a small embolic stroke in the left MCA territory.  STROKE: likely a small embolic stroke in the left MCA  territory  Suspected Etiology:  Cryptogenic as negative workup for large vessel or cardiac source Resultant Symptoms: right-sided weakness and word finding difficulty  Stroke Risk Factors: hyperlipidemia, hypertension and Hypothyroidism Other Stroke Risk Factors: Advanced age, CAD  Outstanding Stroke Work-up Studies:     Workup completed  09/21/2017: Neuro exam stable.  Patient continues to have very mild dysarthria and intermittent word finding difficulty.  Much improved from yesterday's exam.  MRI pending.  Continue to hold antiplatelet until MRI resulted.  Carotid ultrasound negative for acute stenosis.  Transfer to 3 W. after 2 PM today.  Obtain TEE and loop recorder placement in a.m.  Attending Note: She has presented with expressive aphasia and right-sided weakness likely due to small left MCA embolic infarct source to be determined. She received IV tPA and has made near complete recovery. Maintain strict blood pressure control as per post TPA protocol and close neurological monitoring. Continue ongoing stroke workup  09/22/2017: Neuro exam remained stable.  TEE negative for embolic source.  Patient to have a loop recorder implantation prior to discharge.  Aspirin discontinued and patient started on Plavix.  Patient will be discharged home with home health today.  Neurology follow-up in 6 weeks.  Patient will follow up with primary care within the next 2 weeks.  PLAN  09/22/2017: Discharge patient home with [facility] Continue Plavix/Statin Loop Recorder Placement to discharge Ongoing aggressive stroke risk factor management Patient counseled to be compliant with her antithrombotic medications Patient counseled on Lifestyle modifications including, Diet, Exercise, and Stress Follow up with [facility] in 6 weeks  INTRACRANIAL/NECK Atherosclerosis &Stenosis:  Without major vessel flow limiting stenosis, carotid ultrasound negative ASA discontinued and Plavix started  09/22/2017  Rule out AFIB: TEE - Negative Loop Recorder Placement prior to discharge home  MEDICAL ISSUES: Hypothyroidism Synthroid resumed  Anxiety/depression Zoloft resumed Klonopin resumed as needed  Possible UTI Urinalysis negative for acute findings PCP to monitor  Benign essential tremor But no need for medication management at this time May follow-up with outpatient neurology  HYPERTENSION: Stable, SBP goal of <180.  DBP goal of <105.  Long term BP goal normotensive. Home Meds: Norvasc, Inderal  HYPERLIPIDEMIA: Labs(Brief)          Component Value Date/Time   CHOL 151 09/21/2017 0250   TRIG 35 09/21/2017 0250   HDL 73 09/21/2017 0250   CHOLHDL 2.1 09/21/2017 0250   VLDL 7 09/21/2017 0250   LDLCALC 71 09/21/2017 0250    Home Meds:  Crestor 5 mg LDL  goal < 70 Continued on Crestor 5 mg daily Continue statin at discharge  R/O DIABETES: RecentLabs       Lab Results  Component Value Date   HGBA1C 5.6 09/21/2017    HgbA1c goal < 7.0  Other Active Problems: Active Problems:   Cerebral embolism with cerebral infarction   Stroke (cerebrum) (HCC)  DISCHARGE EXAM Blood pressure 137/79, pulse 66, temperature 97.9 F (36.6 C), temperature source Oral, resp. rate 17, height 5' (1.524 m), weight 52.6 kg (115 lb 15.4 oz), last menstrual period 09/28/1979, SpO2 100 %. General - Well nourished, well developed, in no apparent distress Respiratory - Lungs clear bilaterally. No wheezing. Cardiovascular - Regular rate and rhythm, occasional PVCs on cardiac monitoring  NEURO:  Mental Status: AA&Ox3  Language: speech ismildly dysarthric. Naming, fluency, and comprehension intact, some mild problems with repetition and word finding. Cranial Nerves: PERRL. EOMI, visual fields full, no facial asymmetry, facial sensation intact, hearing intact, tongue/uvula/soft palate midline, normal sternocleidomastoid and trapezius muscle strength. No evidence  of tongue atrophy or fibrillations Motor:5/5 in all extremities with normal tone normal range of motion with a fine tremor in both outstretched hands, worse in right hand.  This is a baseline finding Sensation- Intact to light touch bilaterally,no extinction Coordination: FTN intact bilaterally, no ataxia in BLE. Gait- deferred  Discharge Diet   Diet Heart Room service appropriate? Yes; Fluid consistency: Thin liquids  DISCHARGE PLAN  Disposition:  HOME with HH Prior Home Stroke Medications:  aspirin 81 mg daily  Discharge Stroke Meds:  Please discharge patient on Plavix 75 mg daily and Crestor 5 mg  Disposition: 01-Home or Self Care Therapy Recs:              HOME Home Equipment:         3 in 1 bedside commode  Follow Up:     Follow-up Information    Garvin Fila, MD. Schedule an appointment as soon as possible for a visit in 6 week(s).   Specialties:  Neurology, Radiology Contact information: [address] [place] [address] [phone]          Prince Solian, MD -PCP Follow up in 1-2 weeks     Greater than 35 minutes were spent preparing discharge.  Mary Sella, ANP-C Stroke Neurology Team 09/22/2017 3:05 PM I have personally examined this patient, reviewed notes, independently viewed imaging studies, participated in medical decision making and plan of care.ROS completed by me personally and pertinent positives fully documented  I have made any additions or clarifications directly to the above note. Agree with note above.    Antony Contras, MD Medical Director [facility] Pager: [phone] 09/22/2017 3:15 PM

## 2017-09-22 NOTE — Discharge Instructions (Signed)
Implant site care °Keep incision clean and dry for 3 days.  °You can remove outer dressing tomorrow. °Leave steri-strips (little pieces of tape) on until seen in the office for wound check appointment. °Call the office ([phone]) for redness, drainage, swelling, or fever. ° °

## 2017-09-22 NOTE — Interval H&P Note (Signed)
History and Physical Interval Note:  09/22/2017 8:25 AM  Bianca Garcia  has presented today for surgery, with the diagnosis of STROKE  The various methods of treatment have been discussed with the patient and family. After consideration of risks, benefits and other options for treatment, the patient has consented to  Procedure(s): TRANSESOPHAGEAL ECHOCARDIOGRAM (TEE) (N/A) as a surgical intervention .  The patient's history has been reviewed, patient examined, no change in status, stable for surgery.  I have reviewed the patient's chart and labs.  Questions were answered to the patient's satisfaction.     Griselda Bramblett

## 2017-09-22 NOTE — Progress Notes (Signed)
Physical Therapy Treatment Patient Details Name: Bianca Garcia MRN: [id number] DOB: [birth year] Today's Date: 09/22/2017    History of Present Illness 76 y.o. female admitted on 09/20/17 for difficulty walking due to R sided weakness and slurred speech.  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 MRI unremarkable. s/p TEE 12/27- unremarkable. Pt with significant PMH of essential tremor, R rotator cuff tear s/p repair 2014, MVP, HTN, dizziness, and anterior cervical decompression and fusion.       PT Comments    Patient progressing well towards PT goals. Continues to demonstrate imbalance and staggering like gait pattern requiring Min A for support/safety. Pt aware that she is unsteady. May try RW next session. Pt also with memory deficits. Recently moved to [place]. Will continue to follow and progress as tolerated.     Follow Up Recommendations  Home health PT;Supervision - Intermittent     Equipment Recommendations  None recommended by PT    Recommendations for Other Services       Precautions / Restrictions Precautions Precautions: Fall Restrictions Weight Bearing Restrictions: No    Mobility  Bed Mobility Overal bed mobility: Needs Assistance Bed Mobility: Supine to Sit     Supine to sit: Supervision;HOB elevated     General bed mobility comments: supervision for safety; no assist needed.   Transfers Overall transfer level: Needs assistance Equipment used: None Transfers: Sit to/from Stand Sit to Stand: Min guard;Min assist         General transfer comment: Min guard-Min A to steady in standing with pt's BLEs bracing on bed rails. Stood from [employer]. Stood from toilet x1. Transferred to chair post ambulation.  Ambulation/Gait Ambulation/Gait assistance: Min assist Ambulation Distance (Feet): 300 Feet Assistive device: 1 person hand held assist Gait Pattern/deviations: Step-through pattern;Staggering right;Staggering  left;Drifts right/left;Narrow base of support Gait velocity: decreased   General Gait Details: Mildly staggering gait pattern requiring min assist for safety (handheld assist); "I don't trust myself yet." Furniture walking at times when not providing HHA.    Stairs            Wheelchair Mobility    Modified Rankin (Stroke Patients Only) Modified Rankin (Stroke Patients Only) Pre-Morbid Rankin Score: No symptoms Modified Rankin: Moderately severe disability     Balance Overall balance assessment: Needs assistance Sitting-balance support: Feet supported;No upper extremity supported Sitting balance-Leahy Scale: Fair Sitting balance - Comments: Able to perform pericare without difficulty or assist.    Standing balance support: During functional activity;Single extremity supported Standing balance-Leahy Scale: Fair Standing balance comment: Requires UE support at times; able to stand statically without UE support but reaching for counter at times esp during dynamic tasks.                             Cognition Arousal/Alertness: Awake/alert Behavior During Therapy: WFL for tasks assessed/performed Overall Cognitive Status: No family/caregiver present to determine baseline cognitive functioning                                 General Comments: Memory deficits noted. "I keep getting confused about the date."       Exercises      General Comments        Pertinent Vitals/Pain Pain Assessment: Faces Faces Pain Scale: Hurts a little bit Pain Location: chronic back pain and bil hips Pain Descriptors /  Indicators: Sore;Aching;Dull Pain Intervention(s): Monitored during session;Repositioned    Home Living                      Prior Function            PT Goals (current goals can now be found in the care plan section) Progress towards PT goals: Progressing toward goals    Frequency    Min 4X/week      PT Plan Current plan  remains appropriate    Co-evaluation              AM-PAC PT "6 Clicks" Daily Activity  Outcome Measure  Difficulty turning over in bed (including adjusting bedclothes, sheets and blankets)?: None Difficulty moving from lying on back to sitting on the side of the bed? : None Difficulty sitting down on and standing up from a chair with arms (e.g., wheelchair, bedside commode, etc,.)?: Unable Help needed moving to and from a bed to chair (including a wheelchair)?: A Little Help needed walking in hospital room?: A Little Help needed climbing 3-5 steps with a railing? : A Little 6 Click Score: 18    End of Session Equipment Utilized During Treatment: Gait belt Activity Tolerance: Patient tolerated treatment well Patient left: in chair;with call bell/phone within reach Nurse Communication: Mobility status PT Visit Diagnosis: Unsteadiness on feet (R26.81);Other symptoms and signs involving the nervous system (R29.898)     Time: 1320-1340 PT Time Calculation (min) (ACUTE ONLY): 20 min  Charges:  $Gait Training: 8-22 mins                    G Codes:       Wray Kearns, PT, DPT [phone]     [place] 09/22/2017, 1:45 PM

## 2017-09-22 NOTE — Op Note (Signed)
INDICATIONS: cryptogenic stroke  PROCEDURE:   Informed consent was obtained prior to the procedure. The risks, benefits and alternatives for the procedure were discussed and the patient comprehended these risks.  Risks include, but are not limited to, cough, sore throat, vomiting, nausea, somnolence, esophageal and stomach trauma or perforation, bleeding, low blood pressure, aspiration, pneumonia, infection, trauma to the teeth and death.    After a procedural time-out, the oropharynx was anesthetized with 20% benzocaine spray.   During this procedure the patient was administered a total of Versed 6 mg and Fentanyl 50 mg to achieve and maintain moderate conscious sedation.  The patient's heart rate, blood pressure, and oxygen saturationweare monitored continuously during the procedure. The period of conscious sedation was 25 minutes, of which I was present face-to-face 100% of this time.  The transesophageal probe was inserted in the esophagus and stomach without difficulty and multiple views were obtained.  The patient was kept under observation until the patient left the procedure room.  The patient left the procedure room in stable condition.   Agitated microbubble saline contrast was not administered.  COMPLICATIONS:    There were no immediate complications.  FINDINGS:  Normal TEE. No cardiac or aortic source of embolism was identified.  RECOMMENDATIONS:     Consider arrhythmia monitor/loop recorder.  Time Spent Directly with the Patient:  30 minutes   Bianca Garcia 09/22/2017, 11:44 AM

## 2017-09-22 NOTE — Progress Notes (Signed)
  Echocardiogram Echocardiogram Transesophageal has been performed.  Zierra Laroque T Laquenta Whitsell 09/22/2017, 11:54 AM

## 2017-09-22 NOTE — Progress Notes (Signed)
Pt back form cath lab; A&O x4; VSS; left chest loop recorder incision dsg has small bloody stain. Dsg marked for monitoring. Pt denies any discomfort or pain. Will closely monitor pt. Delia Heady RN

## 2017-09-22 NOTE — Progress Notes (Signed)
Occupational Therapy Treatment Patient Details Name: Bianca Garcia MRN: [id number] DOB: 1941-07-27 Today's Date: 09/22/2017    History of present illness 76 y.o. female admitted on 09/20/17 for difficulty walking due to R sided weakness and slurred speech.  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 MRI unremarkable. s/p TEE 12/27- unremarkable. Pt with significant PMH of essential tremor, R rotator cuff tear s/p repair 2014, MVP, HTN, dizziness, and anterior cervical decompression and fusion.      OT comments  Pt able to perform toilet transfer with min guard assist; grooming, peri care, and LB dressing with supervision. Educated pt on use of shower chair for safety with bathing. D/c plan remains appropriate. Will continue to follow acutely.   Follow Up Recommendations  Home health OT;Supervision/Assistance - 24 hour    Equipment Recommendations  3 in 1 bedside commode    Recommendations for Other Services      Precautions / Restrictions Precautions Precautions: Fall Restrictions Weight Bearing Restrictions: No       Mobility Bed Mobility      General bed mobility comments: Pt OOB in chair upon arrival.  Transfers Overall transfer level: Needs assistance Equipment used: Rolling walker (2 wheeled) Transfers: Sit to/from Stand Sit to Stand: Supervision         General transfer comment: Supervision for safety. Cues for hand placement and technique    Balance Overall balance assessment: Needs assistance Sitting-balance support: Feet supported;No upper extremity supported Sitting balance-Leahy Scale: Good Sitting balance - Comments: Able to perform pericare without difficulty or assist.    Standing balance support: No upper extremity supported;During functional activity Standing balance-Leahy Scale: Fair Standing balance comment: for grooming in standing                           ADL either performed or assessed with clinical  judgement   ADL Overall ADL's : Needs assistance/impaired     Grooming: Supervision/safety;Standing;Wash/dry hands               Lower Body Dressing: Supervision/safety;Sit to/from stand   Toilet Transfer: Ambulation;Regular Toilet;RW;Min guard   Toileting- Water quality scientist and Hygiene: Supervision/safety;Sit to/from Nurse, children's Details (indicate cue type and reason): Educated pt and husband on use of shower seat for safety with bathing due to balance deficits. Functional mobility during ADLs: Rolling walker;Min guard       Vision       Perception     Praxis      Cognition Arousal/Alertness: Awake/alert Behavior During Therapy: WFL for tasks assessed/performed Overall Cognitive Status: Impaired/Different from baseline Area of Impairment: Memory                     Memory: Decreased short-term memory                 Exercises     Shoulder Instructions       General Comments      Pertinent Vitals/ Pain       Pain Assessment: Faces Faces Pain Scale: Hurts a little bit Pain Location: back, L hip Pain Descriptors / Indicators: Discomfort;Sore Pain Intervention(s): Monitored during session  Home Living                                          Prior  Functioning/Environment              Frequency  Min 2X/week        Progress Toward Goals  OT Goals(current goals can now be found in the care plan section)  Progress towards OT goals: Progressing toward goals  Acute Rehab OT Goals Patient Stated Goal: Go home OT Goal Formulation: With patient/family  Plan Discharge plan remains appropriate    Co-evaluation                 AM-PAC PT "6 Clicks" Daily Activity     Outcome Measure   Help from another person eating meals?: None Help from another person taking care of personal grooming?: A Little Help from another person toileting, which includes using toliet, bedpan, or urinal?: A  Little Help from another person bathing (including washing, rinsing, drying)?: A Little Help from another person to put on and taking off regular upper body clothing?: A Little Help from another person to put on and taking off regular lower body clothing?: A Little 6 Click Score: 19    End of Session Equipment Utilized During Treatment: Rolling walker  OT Visit Diagnosis: Unsteadiness on feet (R26.81);Other abnormalities of gait and mobility (R26.89);Muscle weakness (generalized) (M62.81)   Activity Tolerance Patient tolerated treatment well   Patient Left in chair;with call bell/phone within reach;with family/visitor present   Nurse Communication          Time: 2706-2376 OT Time Calculation (min): 15 min  Charges: OT General Charges $OT Visit: 1 Visit OT Treatments $Self Care/Home Management : 8-22 mins  Kashis Penley A. Ulice Brilliant, M.S., OTR/L Pager: [place] 09/22/2017, 3:26 PM

## 2017-09-23 ENCOUNTER — Encounter (HOSPITAL_COMMUNITY): Payer: Self-pay | Admitting: Cardiology

## 2017-09-23 NOTE — Care Management Note (Signed)
Case Management Note  Patient Details  Name: Bianca Garcia MRN: [id number] Date of Birth: 05/26/1941  Subjective/Objective:                    Action/Plan: Pt discharged home with orders for [facility] services. Pt lives at [place]. CM spoke to [facility] and they are able to provide the [facility] services. CM faxed them the orders for [facility] services: [phone]. Pt with orders for 3 in 1. Dan with [facility] notified and delivered the equipment to the room. Patient has transportation back to [employer]   Expected Discharge Date:  09/22/17               Expected Discharge Plan:  [place]  In-House Referral:     Discharge planning Services  CM Consult  Post Acute Care Choice:  Durable Medical Equipment Choice offered to:  Patient  DME Arranged:  3-N-1 DME Agency:  [place]:    [place]:     Status of Service:  Completed, signed off  If discussed at [place] of Stay Meetings, dates discussed:    Additional Comments:  Pollie Friar, RN 09/23/2017, 8:33 AM

## 2017-09-28 ENCOUNTER — Other Ambulatory Visit: Payer: Medicare Other

## 2017-10-03 ENCOUNTER — Ambulatory Visit (INDEPENDENT_AMBULATORY_CARE_PROVIDER_SITE_OTHER): Payer: Self-pay | Admitting: *Deleted

## 2017-10-03 DIAGNOSIS — I639 Cerebral infarction, unspecified: Secondary | ICD-10-CM

## 2017-10-03 NOTE — Progress Notes (Signed)
Wound Loop check in clinic. Steri- strips removed prior to OV. Incision site scabbed over. Battery status: Good  R-waves 0.83 mV. 0 symptom episodes, 0 tachy episodes, 24pause episodes ~ all from date of implant, 0 brady episodes. 0 AF episodes. Pt educated about remote monitoring and wound care. Monthly summary reports and ROV with WC PRN

## 2017-10-04 ENCOUNTER — Ambulatory Visit: Payer: Medicare Other | Admitting: Cardiovascular Disease

## 2017-10-06 DIAGNOSIS — I6389 Other cerebral infarction: Secondary | ICD-10-CM | POA: Diagnosis not present

## 2017-10-06 DIAGNOSIS — R278 Other lack of coordination: Secondary | ICD-10-CM | POA: Diagnosis not present

## 2017-10-06 DIAGNOSIS — R2689 Other abnormalities of gait and mobility: Secondary | ICD-10-CM | POA: Diagnosis not present

## 2017-10-06 DIAGNOSIS — M6389 Disorders of muscle in diseases classified elsewhere, multiple sites: Secondary | ICD-10-CM | POA: Diagnosis not present

## 2017-10-06 DIAGNOSIS — R2681 Unsteadiness on feet: Secondary | ICD-10-CM | POA: Diagnosis not present

## 2017-10-10 ENCOUNTER — Other Ambulatory Visit: Payer: Medicare Other

## 2017-10-12 DIAGNOSIS — M859 Disorder of bone density and structure, unspecified: Secondary | ICD-10-CM | POA: Diagnosis not present

## 2017-10-14 ENCOUNTER — Encounter: Payer: Medicare Other | Admitting: Neurology

## 2017-10-14 DIAGNOSIS — I69320 Aphasia following cerebral infarction: Secondary | ICD-10-CM | POA: Diagnosis not present

## 2017-10-14 DIAGNOSIS — I6389 Other cerebral infarction: Secondary | ICD-10-CM | POA: Diagnosis not present

## 2017-10-24 ENCOUNTER — Ambulatory Visit (INDEPENDENT_AMBULATORY_CARE_PROVIDER_SITE_OTHER): Payer: Medicare Other | Admitting: *Deleted

## 2017-10-24 DIAGNOSIS — I639 Cerebral infarction, unspecified: Secondary | ICD-10-CM | POA: Diagnosis not present

## 2017-10-24 NOTE — Progress Notes (Signed)
Carelink Summary Report / Loop Recorder 

## 2017-10-28 ENCOUNTER — Encounter: Payer: Self-pay | Admitting: Neurology

## 2017-10-28 ENCOUNTER — Encounter: Payer: Medicare Other | Admitting: Neurology

## 2017-10-31 ENCOUNTER — Encounter: Payer: Self-pay | Admitting: Neurology

## 2017-11-03 ENCOUNTER — Other Ambulatory Visit: Payer: Self-pay | Admitting: Cardiovascular Disease

## 2017-11-03 LAB — CUP PACEART REMOTE DEVICE CHECK
Date Time Interrogation Session: 20190126203720
Implantable Pulse Generator Implant Date: 20181227

## 2017-11-23 ENCOUNTER — Encounter: Payer: Self-pay | Admitting: Neurology

## 2017-11-23 ENCOUNTER — Ambulatory Visit (INDEPENDENT_AMBULATORY_CARE_PROVIDER_SITE_OTHER): Payer: Medicare Other | Admitting: Neurology

## 2017-11-23 VITALS — BP 107/58 | HR 63 | Ht 62.0 in | Wt 115.0 lb

## 2017-11-23 DIAGNOSIS — E785 Hyperlipidemia, unspecified: Secondary | ICD-10-CM

## 2017-11-23 DIAGNOSIS — G459 Transient cerebral ischemic attack, unspecified: Secondary | ICD-10-CM | POA: Diagnosis not present

## 2017-11-23 DIAGNOSIS — I639 Cerebral infarction, unspecified: Secondary | ICD-10-CM | POA: Diagnosis not present

## 2017-11-23 DIAGNOSIS — I10 Essential (primary) hypertension: Secondary | ICD-10-CM

## 2017-11-23 DIAGNOSIS — E039 Hypothyroidism, unspecified: Secondary | ICD-10-CM | POA: Diagnosis not present

## 2017-11-23 NOTE — Patient Instructions (Addendum)
I had a long d/w patient about his recent stroke, risk for recurrent stroke/TIAs, personally independently reviewed imaging studies and stroke evaluation results and answered questions.Continue clopidogrel 75 mg daily  for secondary stroke prevention and maintain strict control of hypertension with blood pressure goal below 130/90, diabetes with hemoglobin A1c goal below 6.5% and lipids with LDL cholesterol goal below 70 mg/dL. I also advised the patient to eat a healthy diet with plenty of whole grains, cereals, fruits and vegetables, exercise regularly and maintain ideal body weight Followup in the future with Janett Billow, NP in 6 months  -stop aspirin and take plavix 75mg  only  -recommend to follow up with PCP regarding blood pressure management and cholesterol management  -stress reduction needed!  -check blood pressure at home to ensure your levels are around the goal (110-140/60-90)  -follow up in 3 months or call earlier if needed    Mindfulness-Based Stress Reduction Mindfulness-based stress reduction (MBSR) is a program that helps people learn to practice mindfulness. Mindfulness is the practice of intentionally paying attention to the present moment. It can be learned and practiced through techniques such as education, breathing exercises, meditation, and yoga. MBSR includes several mindfulness techniques in one program. MBSR works best when you understand the treatment, are willing to try new things, and can commit to spending time practicing what you learn. MBSR training may include learning about:  How your emotions, thoughts, and reactions affect your body.  New ways to respond to things that cause negative thoughts to start (triggers).  How to notice your thoughts and let go of them.  Practicing awareness of everyday things that you normally do without thinking.  The techniques and goals of different types of meditation.  What are the benefits of MBSR? MBSR can have many  benefits, which include helping you to:  Develop self-awareness. This refers to knowing and understanding yourself.  Learn skills and attitudes that help you to participate in your own health care.  Learn new ways to care for yourself.  Be more accepting about how things are, and let things go.  Be less judgmental and approach things with an open mind.  Be patient with yourself and trust yourself more.  MBSR has also been shown to:  Reduce negative emotions, such as depression and anxiety.  Improve memory and focus.  Change how you sense and approach pain.  Boost your body's ability to fight infections.  Help you connect better with other people.  Improve your sense of well-being.  Follow these instructions at home:  Find a local in-person or online MBSR program.  Set aside some time regularly for mindfulness practice.  Find a mindfulness practice that works best for you. This may include one or more of the following: ? Meditation. Meditation involves focusing your mind on a certain thought or activity. ? Breathing awareness exercises. These help you to stay present by focusing on your breath. ? Body scan. For this practice, you lie down and pay attention to each part of your body from head to toe. You can identify tension and soreness and intentionally relax parts of your body. ? Yoga. Yoga involves stretching and breathing, and it can improve your ability to move and be flexible. It can also provide an experience of testing your body's limits, which can help you release stress. ? Mindful eating. This way of eating involves focusing on the taste, texture, color, and smell of each bite of food. Because this slows down eating and helps you feel full  sooner, it can be an important part of a weight-loss plan.  Find a podcast or recording that provides guidance for breathing awareness, body scan, or meditation exercises. You can listen to these any time when you have a free  moment to rest without distractions.  Follow your treatment plan as told by your health care provider. This may include taking regular medicines and making changes to your diet or lifestyle as recommended. How to practice mindfulness To do a basic awareness exercise:  Find a comfortable place to sit.  Pay attention to the present moment. Observe your thoughts, feelings, and surroundings just as they are.  Avoid placing judgment on yourself, your feelings, or your surroundings. Make note of any judgment that comes up, and let it go.  Your mind may wander, and that is okay. Make note of when your thoughts drift, and return your attention to the present moment.  To do basic mindfulness meditation:  Find a comfortable place to sit. This may include a stable chair or a firm floor cushion. ? Sit upright with your back straight. Let your arms fall next to your side with your hands resting on your legs. ? If sitting in a chair, rest your feet flat on the floor. ? If sitting on a cushion, cross your legs in front of you.  Keep your head in a neutral position with your chin dropped slightly. Relax your jaw and rest the tip of your tongue on the roof of your mouth. Drop your gaze to the floor. You can close your eyes if you like.  Breathe normally and pay attention to your breath. Feel the air moving in and out of your nose. Feel your belly expanding and relaxing with each breath.  Your mind may wander, and that is okay. Make note of when your thoughts drift, and return your attention to your breath.  Avoid placing judgment on yourself, your feelings, or your surroundings. Make note of any judgment or feelings that come up, let them go, and bring your attention back to your breath.  When you are ready, lift your gaze or open your eyes. Pay attention to how your body feels after the meditation.  Where to find more information: You can find more information about MBSR from:  Your health care  provider.  Community-based meditation centers or programs.  Programs offered near you.  Summary  Mindfulness-based stress reduction (MBSR) is a program that teaches you how to intentionally pay attention to the present moment. It is used with other treatments to help you cope better with daily stress, emotions, and pain.  MBSR focuses on developing self-awareness, which allows you to respond to life stress without judgment or negative emotions.  MBSR programs may involve learning different mindfulness practices, such as breathing exercises, meditation, yoga, body scan, or mindful eating. Find a mindfulness practice that works best for you, and set aside time for it on a regular basis. This information is not intended to replace advice given to you by your health care provider. Make sure you discuss any questions you have with your health care provider. Document Released: 01/20/2017 Document Revised: 01/20/2017 Document Reviewed: 01/20/2017 Elsevier Interactive Patient Education  [employer].

## 2017-11-23 NOTE — Progress Notes (Signed)
[facility] [address] [place]. [place] [address] [phone]       OFFICE FOLLOW UP NOTE  Bianca. Bianca Garcia Date of Birth:  [birth year] Medical Record Number:  [id number]   Reason for Referral:  Hospital TIA follow up  CHIEF COMPLAINT:  Chief Complaint  Patient presents with  . Follow-up    Stroke follow up, saw Dr, Leonie Man in the hospital . Pt has seen Dr. Krista Blue in the past for tremors in 07/2017    HPI: Bianca Garcia is being seen today in the office for TIA on 09/20/17. History obtained from patient and chart review. Reviewed all radiology images and labs personally.  Bianca Garcia is a 77 year old female with PMH of anxiety, hypertension, hyperlipidemia, hypothyroid, and IBS who had an episode of right-sided weakness and word finding difficulty that resolved spontaneously on 09/20/2017.  The episode did occur again and persisted until her presentation to the hospital.  CT head showed no acute findings.  As she was within the 4-1/2-hour TPA window, she received TPA at 1400 hrs. on 09/20/2017.  CTA of head and neck showed no emergent large vessel occlusion.  MRI of the head was reviewed and did not show any acute findings.  2D echo showed an EF of 55-60%.  Bilateral carotid ultrasound showed a 1-39% internal carotid artery stenosis bilaterally.  TEE was performed and showed no cardiac or aortic source of embolism.  Patient was discharged with recommendations of starting Plavix as patient was on aspirin PTA.  Loop recorder placed on 09/22/2017 as TIA was cardiogenic in nature.  LDL checked was at 71, patient advised to continue Crestor 5 mg.  A1c satisfactory at 5.6.  Patient discharged home in stable condition.  Since discharge, patient has been doing well.  Patient has been continued to take her Crestor 5 mg without any issues of myalgias.  Patient patient continues to take Plavix as recommended at hospital discharge but the patient also continues to take aspirin 81  mg as she misunderstood discharge directions from hospital where she was supposed to D/C aspirin 81 mg.  Patient denies increase in bleeding or bruising with both Plavix and aspirin.  Patient does state that her memory has become worse since his stroke where she is talking in a sentence and forgets what she is talking about.  Patient complains of frontal headaches that have been happening since his stroke.  She states that it is a pressure-like feeling that can also go up the back of her neck.  She states that she does feel these almost every day but they come and go throughout the day.  She has not tried any OTC treatment for these headaches at this time.  Patient also complains of mild dizziness while walking.  She denies falls.  Patient's blood pressures on the low side is satisfactory 107/58.  Patient is prescribed to be taking amlodipine and propranolol which was a PTA prescription.  Patient does not believe she continues to take propranolol but is compliant on amlodipine.  Patient does state that she has had increasing amount of lorazepam she takes that she does take half a tablet (0.25 mg) twice daily.  Patient does state that she has been under an increased amount of stress as she moved a few days prior to TIA admission.  She states that she is still trying to get house cleanout in order to put on the market and does state that this causes increased amount of stress.  Patient states  that she does drink approximately 1 can of beer nightly.  Loop recorder reviewed and it has been negative for atrial fibrillation or any heart arrhythmias so far.  Patient denies new stroke/TIA symptoms.   ROS:   14 system review of systems performed and negative with exception of ringing in ears, chest pain, memory loss, and dizziness  PMH:  Past Medical History:  Diagnosis Date  . Anxiety   . Arthritis   . Biceps tendonitis 01/05/2013  . 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 Chest pain   . Dizziness   . HTN  (hypertension)    no meds  . Hypercholesterolemia   . Hypothyroid   . Hypothyroidism   . IBS (irritable bowel syndrome)   . Impingement syndrome of right shoulder 01/05/2013  . MVP (mitral valve prolapse)    Hx of   . Normal coronary arteries    by heart Cath  . Osteopenia   . Right rotator cuff tear 01/05/2013  . Seasonal allergies   . Stroke ([place])   . Tremor     PSH:  Past Surgical History:  Procedure Laterality Date  . ANTERIOR CERVICAL DECOMP/DISCECTOMY FUSION  04/2015    Dr. Pamala Hurry, [place]  . CARDIAC CATHETERIZATION  2004   which revealed smooth and normal coronary arteries  . CARPAL TUNNEL RELEASE  2000   lt  . CATARACT EXTRACTION W/ INTRAOCULAR LENS  IMPLANT, BILATERAL  1/17   Dr. Lucita Ferrara  . COSMETIC SURGERY  2010   eyes-facial  . DENTAL SURGERY  1960  . LASIK    . LOOP RECORDER INSERTION N/A 09/22/2017   Procedure: LOOP RECORDER INSERTION;  Surgeon: Constance Haw, MD;  Location: [facility];  Service: Cardiovascular;  Laterality: N/A;  . SHOULDER ARTHROSCOPY WITH ROTATOR CUFF REPAIR Right 01/05/2013   Procedure: SHOULDER ARTHROSCOPY WITH ARTHROSCOPIC  ROTATOR CUFF REPAIR, ACROMIOPLASTY, EXTENSIVE DEBRIDEMENT;  Surgeon: Johnny Bridge, MD;  Location: [facility];  Service: Orthopedics;  Laterality: Right;  . TEE WITHOUT CARDIOVERSION N/A 09/22/2017   Procedure: TRANSESOPHAGEAL ECHOCARDIOGRAM (TEE);  Surgeon: Sanda Klein, MD;  Location: [facility];  Service: Cardiovascular;  Laterality: N/A;  . TONSILLECTOMY    . TUBAL LIGATION      Social History:  Social History   Socioeconomic History  . Marital status: Married    Spouse name: Not on file  . Number of children: 2  . Years of education: 16 years  . Highest education level: Bachelor's degree (e.g., BA, AB, BS)  Social Needs  . Financial resource strain: Not on file  . Food insecurity - worry: Not on file  . Food insecurity - inability: Not on file  . Transportation  needs - medical: Not on file  . Transportation needs - non-medical: Not on file  Occupational History  . Occupation: Retired  Tobacco Use  . Smoking status: Former Smoker    Last attempt to quit: 01/05/1960    Years since quitting: 57.9  . Smokeless tobacco: Never Used  . Tobacco comment: Remote Hx  Substance and Sexual Activity  . Alcohol use: Yes    Frequency: Never    Comment: daily beer  . Drug use: No  . Sexual activity: No    Birth control/protection: Post-menopausal  Other Topics Concern  . Not on file  Social History Narrative   Lives at home with her husband.   Left-handed.   Occasional caffeine use.    Family History:  Family History  Problem Relation Age of Onset  .  Bladder Cancer Father   . Mitral valve prolapse Mother   . Cancer Maternal Grandfather     Medications:   Current Outpatient Medications on File Prior to Visit  Medication Sig Dispense Refill  . amLODipine (NORVASC) 5 MG tablet Take 0.5 tablets (2.5 mg total) by mouth daily. 45 tablet 3  . Calcium Carbonate-Vitamin D (CALCIUM + D PO) Take by mouth daily.    . clonazePAM (KLONOPIN) 0.5 MG tablet Take 0.25 mg by mouth 2 (two) times daily as needed for anxiety.    . clopidogrel (PLAVIX) 75 MG tablet Take 1 tablet (75 mg total) by mouth daily. 30 tablet 1  . cycloSPORINE (RESTASIS) 0.05 % ophthalmic emulsion Apply to eye.    . levothyroxine (SYNTHROID, LEVOTHROID) 88 MCG tablet Take 88 mcg by mouth daily.    . Multiple Vitamins-Minerals (PRESERVISION AREDS PO) Take 1 tablet by mouth 2 (two) times daily.     . Multiple Vitamins-Minerals (SUPER THERA VITE M) TABS Take by mouth.    . propranolol (INDERAL) 40 MG tablet Take 1 tablet (40 mg total) 2 (two) times daily by mouth. 60 tablet 11  . Propylene Glycol (SYSTANE BALANCE OP) Apply 1 drop to eye daily. Reported on 02/27/2016    . rosuvastatin (CRESTOR) 5 MG tablet Take 1 tablet (5 mg total) by mouth daily. 90 tablet 3   No current facility-administered  medications on file prior to visit.     Allergies:   Allergies  Allergen Reactions  . Fluticasone Other (See Comments)    DRY EYES  . Codeine   . Lipitor [Atorvastatin Calcium]     Intolerant   . Sulfa Antibiotics     vomiting    Physical Exam  Vitals:   11/23/17 1431  BP: (!) 107/58  Pulse: 63  Weight: 115 lb (52.2 kg)  Height: 5\' 2"  (1.575 m)   Body mass index is 21.03 kg/m. No exam data present  General: Frail petite, elderly Caucasian female well nourished, seated, in no evident distress Head: head normocephalic and atraumatic.   Neck: supple with no carotid or supraclavicular bruits Cardiovascular: regular rate and rhythm, no murmurs Musculoskeletal: no deformity Skin:  no rash/petichiae Vascular:  Normal pulses all extremities  Neurologic Exam Mental Status: Awake and fully alert. Oriented to place and time. Remote memory intact. Attention span, concentration and fund of knowledge appropriate. Mood and affect appropriate.  Clock drawing 4/4.  Animal naming test 11.  Recall 0/3. Cranial Nerves: Fundoscopic exam reveals sharp disc margins. Pupils equal, briskly reactive to light. Extraocular movements full without nystagmus. Visual fields full to confrontation. Hearing intact. Facial sensation intact. Face, tongue, palate moves normally and symmetrically.  Motor: Normal bulk and tone. Normal strength in all tested extremity muscles. Sensory.: intact to touch , pinprick , position and vibratory sensation.  Coordination: Rapid alternating movements normal in all extremities. Finger-to-nose and heel-to-shin performed accurately bilaterally. Gait and Station: Arises from chair without difficulty. Stance is normal. Gait demonstrates normal stride length and balance . Able to heel, toe and tandem walk without difficulty.  Reflexes: 1+ and symmetric. Toes downgoing.    NIHSS  0 Modified Rankin  1   Diagnostic Data (Labs, Imaging, Testing)  Ct Angio Headand neck and  perfusionW Or Wo Contrast Result Date: 09/20/2017 IMPRESSION: 1. No emergent large vessel occlusion.No infarct or penumbra by CT perfusion. 2.Atherosclerosis without major vessel flow limiting stenosisin the head or neck. 3. 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  Moderate calcified plaque at  theLICA bulb.No flow limiting stenosis or ulceration. Mild atheromatous plaque at the bifurcationRICA,No stenosis or ulceration.  Dg Chest 2 View Result Date: 09/20/2017 IMPRESSION: No active cardiopulmonary disease.  Ct Head Code Stroke Wo Contrast Result Date: 09/20/2017 IMPRESSION: No acute finding.ASPECTS is 10.  B/L Carotid U/S:1-39% internal carotid artery stenosis bilaterally. Vertebral arteries are patent with antegrade flow.  Echocardiogram: Study Conclusions - Left ventricle: The cavity size was normal. Wall thickness was normal. Systolic function was normal. The estimated ejection fraction was in the range of 55% to 60%. Wall motion was normal; there were no regional wall motion abnormalities. Features are consistent with a pseudonormal left ventricular filling pattern, with concomitant abnormal relaxation and increased filling pressure (grade 2 diastolic dysfunction). - Aortic valve: There was trivial regurgitation. - Mitral valve: There was mild regurgitation. - Pulmonary arteries: Systolic pressure was mildly increased. PA peak pressure: 36 mm Hg (S). Impressions: - Normal LV systolic function; moderate diastolic dysfunction; trace AI; mild MR and TR; mildly elevated pulmonary pressure  MRI Head: IMPRESSION: No acute finding by MRI. Mild to moderate chronic small-vessel ischemic changes of the cerebral hemispheric white matter with central volume loss.  TEE: Normal TEE. No cardiac or aortic source of embolism was identified      ASSESSMENT: Drisana Schweickert is a  77 y.o. year old female presented with left hemispheric infarct  symptoms on 09/20/2017. And was treated with IV TPA with excellent clinical recovery and no infarct noted on MRI  Patient had a loop recorder placed prior to discharge from hospital and no episodes of A. fib have been found at this point.  Vascular risk factors include hypertension, hyperlipidemia, and hypothyroidism.    PLAN: I had a long d/w patient about his recent stroke, risk for recurrent stroke/TIAs, personally independently reviewed imaging studies and stroke evaluation results and answered questions.Continue clopidogrel 75 mg daily  for secondary stroke prevention and maintain strict control of hypertension with blood pressure goal below 130/90, diabetes with hemoglobin A1c goal below 6.5% and lipids with LDL cholesterol goal below 70 mg/dL. I also advised the patient to eat a healthy diet with plenty of whole grains, cereals, fruits and vegetables, exercise regularly and maintain ideal body weight Followup in the future with Janett Billow, NP in 6 months  -Continue loop recorder monitoring  -stop aspirin 81mg  and continue plavix 75mg  only  -PCP to manage HTN and HLD   -stress reduction - symptoms of dizziness, pressure/throbbing frontal and neck headache, and worsening memory may all possibly be because by increased stress as patient did not have an infarct on her MRI.  Patient was advised to perform stress reduction exercises like meditation, yoga and regular exercises.  -Monitor BP at home to check for hypertension/hypotension  -follow up in 3 months or call earlier if needed  Greater than 50% of time during this 25  minute visit was spent on counseling,explanation of diagnosis TIA and stroke, planning of further management, discussion with patient and family and coordination of care.  Antony Contras, MD  [facility] [address] [place], [place] [address]  Phone [phone] Fax [phone]

## 2017-11-24 ENCOUNTER — Ambulatory Visit (INDEPENDENT_AMBULATORY_CARE_PROVIDER_SITE_OTHER): Payer: Medicare Other | Admitting: *Deleted

## 2017-11-24 DIAGNOSIS — I639 Cerebral infarction, unspecified: Secondary | ICD-10-CM

## 2017-11-25 NOTE — Progress Notes (Signed)
Carelink Summary Report / Loop Recorder 

## 2017-12-03 ENCOUNTER — Telehealth: Payer: Self-pay | Admitting: Neurology

## 2017-12-03 NOTE — Telephone Encounter (Signed)
Pt called in and stated she had dizziness episode yesterday.  When asking what she was doing when dizziness happened, she cannot give any clear answer.  She said she felt lightheaded, shaky, lying down does not help, however take Klonopin and slept for 2 hours, and then she felt okay.  Today again she was sitting table writing checks, felt tightness at the top of her head, fused to explode, also felt like her inside was shaky, but nothing physically shaking.  Check blood pressure 139/78.  She took Klonopin 11 AM, had very short nap, got up for lunch, still did not feel well.  On talking to her, she worries about stroke recurrence, seems anxious, with shaky voice, and cannot describe what was really going on.  She had history of anxiety on Klonopin but low-dose 0.25 twice daily as needed.  When asked about stress and anxiety, she said she did not feel that way.  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  I told her to increase 0.5 mg twice a day as needed.  And I told her to relax and de-stress herself.  She agreed.  I also told her that if she felt the symptoms concerning for strokelike symptoms, such as vertigo, nausea, vomiting, difficulty speak, slurry speech, weakness or numbness, she should call 911 for emergent evaluation.  She expressed understanding and appreciation.  Rosalin Hawking, MD PhD Stroke Neurology 12/03/2017 2:25 PM

## 2017-12-05 NOTE — Telephone Encounter (Signed)
Thanks

## 2017-12-21 ENCOUNTER — Other Ambulatory Visit: Payer: Self-pay

## 2017-12-21 ENCOUNTER — Emergency Department (HOSPITAL_COMMUNITY): Payer: Medicare Other

## 2017-12-21 ENCOUNTER — Encounter (HOSPITAL_COMMUNITY): Payer: Self-pay

## 2017-12-21 ENCOUNTER — Emergency Department (HOSPITAL_COMMUNITY)
Admission: EM | Admit: 2017-12-21 | Discharge: 2017-12-21 | Disposition: A | Discharge status: Home / Self Care | Payer: Medicare Other | Attending: Emergency Medicine | Admitting: Emergency Medicine

## 2017-12-21 DIAGNOSIS — I10 Essential (primary) hypertension: Secondary | ICD-10-CM | POA: Insufficient documentation

## 2017-12-21 DIAGNOSIS — Z7902 Long term (current) use of antithrombotics/antiplatelets: Secondary | ICD-10-CM | POA: Diagnosis not present

## 2017-12-21 DIAGNOSIS — R202 Paresthesia of skin: Secondary | ICD-10-CM | POA: Insufficient documentation

## 2017-12-21 DIAGNOSIS — R200 Anesthesia of skin: Secondary | ICD-10-CM | POA: Insufficient documentation

## 2017-12-21 DIAGNOSIS — R42 Dizziness and giddiness: Secondary | ICD-10-CM | POA: Insufficient documentation

## 2017-12-21 DIAGNOSIS — Z8673 Personal history of transient ischemic attack (TIA), and cerebral infarction without residual deficits: Secondary | ICD-10-CM | POA: Diagnosis not present

## 2017-12-21 DIAGNOSIS — Z79899 Other long term (current) drug therapy: Secondary | ICD-10-CM | POA: Diagnosis not present

## 2017-12-21 DIAGNOSIS — E039 Hypothyroidism, unspecified: Secondary | ICD-10-CM | POA: Insufficient documentation

## 2017-12-21 DIAGNOSIS — Z87891 Personal history of nicotine dependence: Secondary | ICD-10-CM | POA: Diagnosis not present

## 2017-12-21 DIAGNOSIS — R51 Headache: Secondary | ICD-10-CM | POA: Diagnosis not present

## 2017-12-21 DIAGNOSIS — R262 Difficulty in walking, not elsewhere classified: Secondary | ICD-10-CM | POA: Diagnosis not present

## 2017-12-21 LAB — COMPREHENSIVE METABOLIC PANEL
ALT: 15 U/L (ref 14–54)
AST: 19 U/L (ref 15–41)
Albumin: 3.9 g/dL (ref 3.5–5.0)
Alkaline Phosphatase: 76 U/L (ref 38–126)
Anion gap: 7 (ref 5–15)
BUN: 12 mg/dL (ref 6–20)
CO2: 27 mmol/L (ref 22–32)
Calcium: 9.1 mg/dL (ref 8.9–10.3)
Chloride: 105 mmol/L (ref 101–111)
Creatinine, Ser: 0.78 mg/dL (ref 0.44–1.00)
GFR calc Af Amer: >60 mL/min (ref >60)
GFR calc non Af Amer: >60 mL/min (ref >60)
Glucose, Bld: 122 mg/dL — ABNORMAL HIGH (ref 65–99)
Potassium: 3.9 mmol/L (ref 3.5–5.1)
Sodium: 139 mmol/L (ref 135–145)
Total Bilirubin: 0.6 mg/dL (ref 0.3–1.2)
Total Protein: 6.9 g/dL (ref 6.5–8.1)

## 2017-12-21 LAB — APTT: aPTT: 27 seconds (ref 24–36)

## 2017-12-21 LAB — DIFFERENTIAL
Basophils Absolute: 0 10*3/uL (ref 0.0–0.1)
Basophils Relative: 0 %
Eosinophils Absolute: 0.1 10*3/uL (ref 0.0–0.7)
Eosinophils Relative: 1 %
Lymphocytes Relative: 27 %
Lymphs Abs: 1.7 10*3/uL (ref 0.7–4.0)
Monocytes Absolute: 0.3 10*3/uL (ref 0.1–1.0)
Monocytes Relative: 5 %
Neutro Abs: 4.2 10*3/uL (ref 1.7–7.7)
Neutrophils Relative %: 67 %

## 2017-12-21 LAB — RAPID URINE DRUG SCREEN, HOSP PERFORMED
Amphetamines: NOT DETECTED
Barbiturates: NOT DETECTED
Benzodiazepines: NOT DETECTED
Cocaine: NOT DETECTED
Opiates: NOT DETECTED
Tetrahydrocannabinol: NOT DETECTED

## 2017-12-21 LAB — URINALYSIS, ROUTINE W REFLEX MICROSCOPIC
Bilirubin Urine: NEGATIVE
Glucose, UA: NEGATIVE mg/dL
Hgb urine dipstick: NEGATIVE
Ketones, ur: NEGATIVE mg/dL
Leukocytes, UA: NEGATIVE
Nitrite: NEGATIVE
Protein, ur: NEGATIVE mg/dL
Specific Gravity, Urine: 1.01 (ref 1.005–1.030)
pH: 6 (ref 5.0–8.0)

## 2017-12-21 LAB — I-STAT CHEM 8, ED
BUN: 15 mg/dL (ref 6–20)
Calcium, Ion: 1.12 mmol/L — ABNORMAL LOW (ref 1.15–1.40)
Chloride: 103 mmol/L (ref 101–111)
Creatinine, Ser: 0.8 mg/dL (ref 0.44–1.00)
Glucose, Bld: 116 mg/dL — ABNORMAL HIGH (ref 65–99)
HCT: 41 % (ref 36.0–46.0)
Hemoglobin: 13.9 g/dL (ref 12.0–15.0)
Potassium: 3.9 mmol/L (ref 3.5–5.1)
Sodium: 140 mmol/L (ref 135–145)
TCO2: 26 mmol/L (ref 22–32)

## 2017-12-21 LAB — I-STAT TROPONIN, ED: Troponin i, poc: 0 ng/mL (ref 0.00–0.08)

## 2017-12-21 LAB — CBC
HCT: 42 % (ref 36.0–46.0)
Hemoglobin: 13.9 g/dL (ref 12.0–15.0)
MCH: 31.4 pg (ref 26.0–34.0)
MCHC: 33.1 g/dL (ref 30.0–36.0)
MCV: 94.8 fL (ref 78.0–100.0)
Platelets: 253 10*3/uL (ref 150–400)
RBC: 4.43 MIL/uL (ref 3.87–5.11)
RDW: 13.6 % (ref 11.5–15.5)
WBC: 6.2 10*3/uL (ref 4.0–10.5)

## 2017-12-21 LAB — PROTIME-INR
INR: 0.95
Prothrombin Time: 12.6 seconds (ref 11.4–15.2)

## 2017-12-21 LAB — ETHANOL: Alcohol, Ethyl (B): <10 mg/dL (ref <10)

## 2017-12-21 MED ORDER — GADOBENATE DIMEGLUMINE 529 MG/ML IV SOLN
10.0000 mL | Freq: Once | INTRAVENOUS | Status: AC
  Administered 2017-12-21: 10 mL via INTRAVENOUS

## 2017-12-21 NOTE — ED Triage Notes (Signed)
Patient complains of left sided facial numbness with tongue tingling since 0900. Denies any other numbness or discomfort. Grips equal, smile symmetrical, speech clear. Has hx of stroke 08/2017

## 2017-12-21 NOTE — ED Provider Notes (Signed)
[facility] EMERGENCY DEPARTMENT Provider Note   CSN: [id number] Arrival date & time: 12/21/17  1239     History   Chief Complaint No chief complaint on file.   HPI Bianca Garcia is a 77 y.o. female.  HPI  This is a 77 year old female history of stroke, hypertension, presents today with complaint of waking this morning with some dizziness and left facial numbness.  She has not noted any weakness.  She states that she has chronic problems with balance that she describes as having been present "a lifetime".  She denies any headache or head injury.  She denies any anticoagulant use.  She has not had any visual changes.  She has not noted any word finding, speech difficulties, or lateralized deficits.  Patient was hospitalized in December and given TPA for right-sided weakness with complete resolution of symptoms. Past Medical History:  Diagnosis Date  . Anxiety   . Arthritis   . Biceps tendonitis 01/05/2013  . 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 Chest pain   . Dizziness   . HTN (hypertension)    no meds  . Hypercholesterolemia   . Hypothyroid   . Hypothyroidism   . IBS (irritable bowel syndrome)   . Impingement syndrome of right shoulder 01/05/2013  . MVP (mitral valve prolapse)    Hx of   . Normal coronary arteries    by heart Cath  . Osteopenia   . Right rotator cuff tear 01/05/2013  . Seasonal allergies   . Stroke ([place])   . Tremor     Patient Active Problem List   Diagnosis Date Noted  . 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 Stroke (cerebrum) ([place]) 09/20/2017  . Paresthesia 08/01/2017  . Tremor 08/01/2017  . Anxiety 02/27/2016  . 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 Coronary artery calcification 02/20/2016  . Hypothyroid   . Right rotator cuff tear 01/05/2013  . Impingement syndrome of right shoulder 01/05/2013  . 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 Biceps tendonitis 01/05/2013  . Floater, vitreous 03/21/2012  . 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 HTN (hypertension) 12/22/2011  . Hyperlipidemia 12/22/2011    Past Surgical History:  Procedure Laterality Date  . ANTERIOR CERVICAL DECOMP/DISCECTOMY FUSION  04/2015    Dr. Pamala Hurry, [place]  . CARDIAC CATHETERIZATION  2004   which revealed smooth and normal coronary arteries  . CARPAL TUNNEL RELEASE  2000   lt  . CATARACT EXTRACTION W/ INTRAOCULAR LENS  IMPLANT, BILATERAL  1/17   Dr. Lucita Ferrara  . COSMETIC SURGERY  2010   eyes-facial  . DENTAL SURGERY  1960  . LASIK    . LOOP RECORDER INSERTION N/A 09/22/2017   Procedure: LOOP RECORDER INSERTION;  Surgeon: Constance Haw, MD;  Location: [facility];  Service: Cardiovascular;  Laterality: N/A;  . SHOULDER ARTHROSCOPY WITH ROTATOR CUFF REPAIR Right 01/05/2013   Procedure: SHOULDER ARTHROSCOPY WITH ARTHROSCOPIC  ROTATOR CUFF REPAIR, ACROMIOPLASTY, EXTENSIVE DEBRIDEMENT;  Surgeon: Johnny Bridge, MD;  Location: [facility];  Service: Orthopedics;  Laterality: Right;  . TEE WITHOUT CARDIOVERSION N/A 09/22/2017   Procedure: TRANSESOPHAGEAL ECHOCARDIOGRAM (TEE);  Surgeon: Sanda Klein, MD;  Location: [facility];  Service: Cardiovascular;  Laterality: N/A;  . TONSILLECTOMY    . TUBAL LIGATION       OB History    Gravida  2   Para  2   Term  2   Preterm      AB      Living  2     SAB      TAB      Ectopic      Multiple      Live Births               Home Medications    Prior to Admission medications   Medication Sig Start Date End Date Taking? Authorizing Provider  amLODipine (NORVASC) 5 MG tablet Take 0.5 tablets (2.5 mg total) by mouth daily. 11/03/17   Nahser, Wonda Cheng, MD  Calcium Carbonate-Vitamin D (CALCIUM + D PO) Take by mouth daily.    [provider]  clonazePAM (KLONOPIN) 0.5 MG tablet Take 0.25 mg by mouth 2 (two) times daily as needed for anxiety.    [provider]  clopidogrel (PLAVIX) 75 MG tablet Take 1 tablet (75 mg total)  by mouth daily. 09/23/17   Mary Sella, NP  cycloSPORINE (RESTASIS) 0.05 % ophthalmic emulsion Apply to eye. 04/21/15   [provider]  levothyroxine (SYNTHROID, LEVOTHROID) 88 MCG tablet Take 88 mcg by mouth daily.    [provider]  Multiple Vitamins-Minerals (PRESERVISION AREDS PO) Take 1 tablet by mouth 2 (two) times daily.     [provider]  Multiple Vitamins-Minerals (SUPER THERA VITE M) TABS Take by mouth.    [provider]  propranolol (INDERAL) 40 MG tablet Take 1 tablet (40 mg total) 2 (two) times daily by mouth. 08/01/17   Marcial Pacas, MD  Propylene Glycol (SYSTANE BALANCE OP) Apply 1 drop to eye daily. Reported on 02/27/2016    [provider]  rosuvastatin (CRESTOR) 5 MG tablet Take 1 tablet (5 mg total) by mouth daily. 07/21/17 11/23/17  Nahser, Wonda Cheng, MD    Family History Family History  Problem Relation Age of Onset  . Bladder Cancer Father   . Mitral valve prolapse Mother   . Cancer Maternal Grandfather     Social History Social History   Tobacco Use  . Smoking status: Former Smoker    Last attempt to quit: 01/05/1960    Years since quitting: 58.0  . Smokeless tobacco: Never Used  . Tobacco comment: Remote Hx  Substance Use Topics  . Alcohol use: Yes    Frequency: Never    Comment: daily beer  . Drug use: No     Allergies   Fluticasone; Codeine; Lipitor [atorvastatin calcium]; and Sulfa antibiotics   Review of Systems Review of Systems  All other systems reviewed and are negative.    Physical Exam Updated Vital Signs BP 134/63   Pulse 60   Temp 97.6 F (36.4 C)   Resp 16   LMP 09/28/1979   SpO2 99%   Physical Exam  Constitutional: She is oriented to person, place, and time. She appears well-developed and well-nourished. No distress.  HENT:  Head: Normocephalic and atraumatic.  Right Ear: External ear normal.  Left Ear: External ear normal.  Eyes: Pupils are equal, round, and reactive to  light. Conjunctivae and EOM are normal.  Neck: Normal range of motion. Neck supple.  Cardiovascular: Normal rate, regular rhythm and normal heart sounds.  Pulmonary/Chest: Effort normal and breath sounds normal.  Abdominal: Soft. Bowel sounds are normal.  Musculoskeletal: Normal range of motion.  Neurological: She is alert and oriented to person, place, and time. She displays normal reflexes. No cranial nerve deficit or sensory deficit. She exhibits normal muscle tone. Coordination normal.  Skin: Skin is warm and dry. Capillary refill takes less than 2 seconds.  Psychiatric:  She has a normal mood and affect.  Nursing note and vitals reviewed.    ED Treatments / Results  Labs (all labs ordered are listed, but only abnormal results are displayed) Labs Reviewed  I-STAT CHEM 8, ED - Abnormal; Notable for the following components:      Result Value   Glucose, Bld 116 (*)    Calcium, Ion 1.12 (*)    All other components within normal limits  PROTIME-INR  APTT  CBC  DIFFERENTIAL  ETHANOL  COMPREHENSIVE METABOLIC PANEL  RAPID URINE DRUG SCREEN, HOSP PERFORMED  URINALYSIS, ROUTINE W REFLEX MICROSCOPIC  I-STAT TROPONIN, ED    EKG EKG Interpretation  Date/Time:  Wednesday December 21 2017 13:01:00 EDT Ventricular Rate:  58 PR Interval:  154 QRS Duration: 88 QT Interval:  426 QTC Calculation: 418 R Axis:   13 Text Interpretation:  Sinus bradycardia Nonspecific ST abnormality Abnormal ECG No significant change since last tracing Confirmed by Pattricia Boss [id number]) on 12/21/2017 2:01:47 PM   Radiology Ct Head Wo Contrast  Result Date: 12/21/2017 CLINICAL DATA:  Headache, left-sided facial numbness. EXAM: CT HEAD WITHOUT CONTRAST TECHNIQUE: Contiguous axial images were obtained from the base of the skull through the vertex without intravenous contrast. COMPARISON:  CT scan of September 21, 2017. MRI of September 22, 2017. FINDINGS: Brain: Mild chronic ischemic white matter disease is  noted. No mass effect or midline shift is noted. Ventricular size is within normal limits. There is no evidence of mass lesion, hemorrhage or acute infarction. Vascular: No hyperdense vessel or unexpected calcification. Skull: Normal. Negative for fracture or focal lesion. Sinuses/Orbits: No acute finding. Other: None. IMPRESSION: Mild chronic ischemic white matter disease. No acute intracranial abnormality seen. Electronically Signed   By: Marijo Conception, M.D.   On: 12/21/2017 14:19    Procedures Procedures (including critical care time)  Medications Ordered in ED Medications - No data to display   Initial Impression / Assessment and Plan / ED Course  I have reviewed the triage vital signs and the nursing notes.  Pertinent labs & imaging results that were available during my care of the patient were reviewed by me and considered in my medical decision making (see chart for details).     Care signed out to Dr. Ellender Hose.  Patient will be able to be discharged if MRI of brain is normal.  I discussed the plan with the patient and husband and they voiced understanding.  Final Clinical Impressions(s) / ED Diagnoses   Final diagnoses:  Vertigo    ED Discharge Orders    None       Pattricia Boss, MD 12/21/17 1550

## 2017-12-21 NOTE — ED Provider Notes (Signed)
Assumed care from Dr. Jeanell Sparrow at 3:31 PM. Briefly, the patient is a 77 y.o. 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 here with dizziness, transient L facial numbness. Low suspicion for stroke but MR pending. Labs are o/w reassuring. Neurology has seen pt. Can likely d/c if MR negative. Awaiting UA.   Labs Reviewed  COMPREHENSIVE METABOLIC PANEL - Abnormal; Notable for the following components:      Result Value   Glucose, Bld 122 (*)    All other components within normal limits  I-STAT CHEM 8, ED - Abnormal; Notable for the following components:   Glucose, Bld 116 (*)    Calcium, Ion 1.12 (*)    All other components within normal limits  ETHANOL  PROTIME-INR  APTT  CBC  DIFFERENTIAL  RAPID URINE DRUG SCREEN, HOSP PERFORMED  URINALYSIS, ROUTINE W REFLEX MICROSCOPIC  I-STAT TROPONIN, ED    Course of Care: -MR negative. Pt cleared from neuro perspective. Suspect transient paresthesia, possible small TIA but pt already medically optimized, just had stroke w/u. Pt has had no recurrence of sx. She's well appearing on exam. Updated family, tp with plan and iwll d/c home. Return precautions given.     Duffy Bruce, MD 12/21/17 1840

## 2017-12-21 NOTE — ED Notes (Addendum)
Pt arrives via POV from [place]. Sent over due to new onset dizziness, left facial numbness and left sided headache. LSN 86min PTA per patient.  Pt awake, alert, appropriate, speech clear, VAN neg, no droop. Hx of stroke, HTN. Lenn Sink, PA-C notified to assess pt for further orders.

## 2017-12-21 NOTE — ED Provider Notes (Signed)
Patient placed in Quick Look pathway, seen and evaluated   Chief Complaint: Facial numbness  HPI:   77 year old female with past medical history of hypertension, hyperlipidemia, CVA presents today with complaints of balance issues, numbness and tingling in the left side of her face.  Patient notes that she woke up this morning around 9 AM approximately 4 hours prior to arrival and felt off balance, she also reports some numbness and tingling in the left side of her face.  She denies any distal neurological deficits, denies any chest pain.  Patient does note that she has some memory issues status post second stroke.  Patient does not recall what day of the week it is what month or what year.  ROS: Numbness (one)  Physical Exam:   Gen: No distress  Neuro: Awake and Alert  Skin: Warm    Focused Exam:   NIH 2-unable to answer questions appropriately  No acute sensory strength or motor deficits noted on my exam  I spoke with neurology team, stroke order set initiated they will see patient   Initiation of care has begun. The patient has been counseled on the process, plan, and necessity for staying for the completion/evaluation, and the remainder of the medical screening examination    Okey Regal, Hershal Coria 12/21/17 [place], Wenda Overland, MD 12/22/17 [phone]

## 2017-12-21 NOTE — ED Notes (Signed)
Patient transported to MRI 

## 2017-12-21 NOTE — Consult Note (Addendum)
Requesting Physician: Dr. Jeanell Sparrow    Chief Complaint: Left facial tingling  History obtained from:  Patient     HPI:                                                                                                                                         Bianca Garcia is an 77 y.o. female with a history of TIA, hypercholesterolemia, anxiety, mitral valve prolapse.  Patient states that she has been doing well since her TIA back in December.  TIA in December consisted of right-sided weakness and word finding difficulties that resolved spontaneously.  At that time she had a CT which was negative, CTA of head and neck which showed no large vessel occlusion, MRI of the head which did not show any acute findings, 2D echo which showed an EF of 55 to 60%.  Bilateral carotid ultrasound showing 1-39% internal carotid artery stenosis bilaterally.  TEE which showed no cardiac or aortic source of embolism.  She also had a loop recorder placed on 09/22/2017.  Her A1c was 5.6 and her LDL was 71.  Today patient comes to the emergency department due to sudden onset of left cheek numbness, left eye droopiness that resolved over period of 1 hour.  At this time she is completely resolved but feels possibly some numbness on the right side of her lower lip.  Other than that she has no neuro deficits.  She denies any headache at this time.  She states that she has been taking her Plavix on a daily basis.  Date last known well: Date: 12/21/2017 Time last known well: Time: 09:00 tPA Given: No: Minor symptoms NIH stroke of 0 Modified Rankin: Rankin Score=0    Past Medical History:  Diagnosis Date  . Anxiety   . Arthritis   . Biceps tendonitis 01/05/2013  . 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 Chest pain   . Dizziness   . HTN (hypertension)    no meds  . Hypercholesterolemia   . Hypothyroid   . Hypothyroidism   . IBS (irritable bowel syndrome)   . Impingement syndrome of right shoulder 01/05/2013  . MVP  (mitral valve prolapse)    Hx of   . Normal coronary arteries    by heart Cath  . Osteopenia   . Right rotator cuff tear 01/05/2013  . Seasonal allergies   . Stroke ([place])   . Tremor     Past Surgical History:  Procedure Laterality Date  . ANTERIOR CERVICAL DECOMP/DISCECTOMY FUSION  04/2015    Dr. Pamala Hurry, [place]  . CARDIAC CATHETERIZATION  2004   which revealed smooth and normal coronary arteries  . CARPAL TUNNEL RELEASE  2000   lt  . CATARACT EXTRACTION W/ INTRAOCULAR LENS  IMPLANT, BILATERAL  1/17   Dr. Lucita Ferrara  . COSMETIC SURGERY  2010   eyes-facial  .  DENTAL SURGERY  1960  . LASIK    . LOOP RECORDER INSERTION N/A 09/22/2017   Procedure: LOOP RECORDER INSERTION;  Surgeon: Constance Haw, MD;  Location: [facility];  Service: Cardiovascular;  Laterality: N/A;  . SHOULDER ARTHROSCOPY WITH ROTATOR CUFF REPAIR Right 01/05/2013   Procedure: SHOULDER ARTHROSCOPY WITH ARTHROSCOPIC  ROTATOR CUFF REPAIR, ACROMIOPLASTY, EXTENSIVE DEBRIDEMENT;  Surgeon: Johnny Bridge, MD;  Location: [facility];  Service: Orthopedics;  Laterality: Right;  . TEE WITHOUT CARDIOVERSION N/A 09/22/2017   Procedure: TRANSESOPHAGEAL ECHOCARDIOGRAM (TEE);  Surgeon: Sanda Klein, MD;  Location: [facility];  Service: Cardiovascular;  Laterality: N/A;  . TONSILLECTOMY    . TUBAL LIGATION      Family History  Problem Relation Age of Onset  . Bladder Cancer Father   . Mitral valve prolapse Mother   . Cancer Maternal Grandfather          Social History:  reports that she quit smoking about 58 years ago. She has never used smokeless tobacco. She reports that she drinks alcohol. She reports that she does not use drugs.  Allergies:  Allergies  Allergen Reactions  . Fluticasone Other (See Comments)    DRY EYES  . Codeine   . Lipitor [Atorvastatin Calcium]     Intolerant   . Sulfa Antibiotics     vomiting    Medications:                                                                                                                           No current facility-administered medications for this encounter.    Current Outpatient Medications  Medication Sig Dispense Refill  . amLODipine (NORVASC) 5 MG tablet Take 0.5 tablets (2.5 mg total) by mouth daily. 45 tablet 3  . busPIRone (BUSPAR) 5 MG tablet Take 2.5 mg by mouth as needed (anxiety).    . Calcium Carbonate-Vitamin D (CALCIUM + D PO) Take by mouth daily.    . clonazePAM (KLONOPIN) 0.5 MG tablet Take 0.25 mg by mouth 2 (two) times daily as needed for anxiety.    . clopidogrel (PLAVIX) 75 MG tablet Take 1 tablet (75 mg total) by mouth daily. 30 tablet 1  . cycloSPORINE (RESTASIS) 0.05 % ophthalmic emulsion Apply to eye.    . levothyroxine (SYNTHROID, LEVOTHROID) 88 MCG tablet Take 88 mcg by mouth daily.    . Multiple Vitamins-Minerals (PRESERVISION AREDS PO) Take 1 tablet by mouth 2 (two) times daily.     . Multiple Vitamins-Minerals (SUPER THERA VITE M) TABS Take by mouth.    . naproxen sodium (ALEVE) 220 MG tablet Take 220 mg by mouth as needed (chest pain).    . propranolol (INDERAL) 40 MG tablet Take 1 tablet (40 mg total) 2 (two) times daily by mouth. 60 tablet 11  . Propylene Glycol (SYSTANE BALANCE OP) Apply 1 drop to eye daily. Reported on 02/27/2016    . rosuvastatin (CRESTOR) 5 MG tablet Take 1 tablet (  5 mg total) by mouth daily. 90 tablet 3  . simethicone (MYLICON) 80 MG chewable tablet Chew 80 mg by mouth every 6 (six) hours as needed for flatulence.       ROS:                                                                                                                                       History obtained from the patient  General ROS: negative for - chills, fatigue, fever, night sweats, weight gain or weight loss Psychological ROS: negative for - , hallucinations, memory difficulties, mood swings or  Ophthalmic ROS: negative for - blurry vision, double vision, eye pain or loss of vision ENT  ROS: negative for - epistaxis, nasal discharge, oral lesions, sore throat, tinnitus or vertigo Respiratory ROS: negative for - cough,  shortness of breath or wheezing Cardiovascular ROS: negative for - chest pain, dyspnea on exertion,  Gastrointestinal ROS: negative for - abdominal pain, diarrhea,  nausea/vomiting or stool incontinence Genito-Urinary ROS: negative for - dysuria, hematuria, incontinence or urinary frequency/urgency Musculoskeletal ROS: negative for - joint swelling or muscular weakness Neurological ROS: as noted in HPI   General Examination:                                                                                                      Blood pressure 131/69, pulse (!) 47, temperature 97.6 F (36.4 C), resp. rate 18, last menstrual period 09/28/1979, SpO2 98 %.  HEENT-  Normocephalic, no lesions, without obvious abnormality.  Normal external eye and conjunctiva.   Cardiovascular- S1-S2 audible, pulses palpable throughout   Lungs-no rhonchi or wheezing noted, no excessive working breathing.  Saturations within normal limits Abdomen- All 4 quadrants palpated and nontender Extremities- Warm, dry and intact Musculoskeletal-no joint tenderness, deformity or swelling Skin-warm and dry, no hyperpigmentation, vitiligo, or suspicious lesions  Neurological Examination Mental Status: Alert, oriented, thought content appropriate.  Speech fluent without evidence of aphasia.  Able to follow 3 step commands without difficulty. Cranial Nerves: II:  Visual fields grossly normal,  III,IV, VI: ptosis not present, extra-ocular motions intact bilaterally, pupils equal, round, reactive to light and accommodation V,VII: smile symmetric, facial light touch sensation normal bilaterally VIII: hearing normal bilaterally IX,X: uvula rises symmetrically XI: bilateral shoulder shrug XII: midline tongue extension Motor: Right : Upper extremity   5/5    Left:     Upper extremity   5/5  Lower  extremity   5/5  Lower extremity   5/5 Tone and bulk:normal tone throughout; no atrophy noted Sensory: Pinprick and light touch intact throughout, bilaterally Deep Tendon Reflexes: 2+ and symmetric throughout Plantars: Right: downgoing   Left: downgoing Cerebellar: normal finger-to-nose, normal rapid alternating movements and normal heel-to-shin test Gait: Not tested   Lab Results: Basic Metabolic Panel: Recent Labs  Lab 12/21/17 1300 12/21/17 1309  NA 139 140  K 3.9 3.9  CL 105 103  CO2 27  --   GLUCOSE 122* 116*  BUN 12 15  CREATININE 0.78 0.80  CALCIUM 9.1  --     CBC: Recent Labs  Lab 12/21/17 1300 12/21/17 1309  WBC 6.2  --   NEUTROABS 4.2  --   HGB 13.9 13.9  HCT 42.0 41.0  MCV 94.8  --   PLT 253  --     Lipid Panel: No results for input(s): CHOL, TRIG, HDL, CHOLHDL, VLDL, LDLCALC in the last 168 hours.  CBG: No results for input(s): GLUCAP in the last 168 hours.  Imaging: Ct Head Wo Contrast  Result Date: 12/21/2017 CLINICAL DATA:  Headache, left-sided facial numbness. EXAM: CT HEAD WITHOUT CONTRAST TECHNIQUE: Contiguous axial images were obtained from the base of the skull through the vertex without intravenous contrast. COMPARISON:  CT scan of September 21, 2017. MRI of September 22, 2017. FINDINGS: Brain: Mild chronic ischemic white matter disease is noted. No mass effect or midline shift is noted. Ventricular size is within normal limits. There is no evidence of mass lesion, hemorrhage or acute infarction. Vascular: No hyperdense vessel or unexpected calcification. Skull: Normal. Negative for fracture or focal lesion. Sinuses/Orbits: No acute finding. Other: None. IMPRESSION: Mild chronic ischemic white matter disease. No acute intracranial abnormality seen. Electronically Signed   By: Marijo Conception, M.D.   On: 12/21/2017 14:19    Assessment and plan discussed with with attending physician and they are in agreement.    Etta Quill PA-C [facility] [phone]  12/21/2017, 2:36 PM   Assessment: 77 y.o. female with risk factors for stroke including hyperlipidemia and hypertension.  Patient recently had a TIA that included right-sided weakness which resolved on itself.  Today patient presents to hospital with transient left facial tingling in the V2 distribution.  Symptoms have improved.   CT of head is negative.  MRI brain is negative for an acute  Infarct. Low suspicion this is a TIA, however she also has has extensive stroke workup recently. Given negative MRI brian, I feel patient can be discharged with follow up. Loop monitor will need to be interrogated.    Recommend -Continue Crestor and Plavix -- Follow  Up with Dr Leonie Man in clinic  NEUROHOSPITALIST ADDENDUM Seen and examined the patient today. I have made changes to note as needed after reviewing history, exam.  Plan documented as above.     Karena Addison Santasia Rew MD [facility] [phone]  If 7pm to 7am, please call on call as listed on AMION.

## 2017-12-27 ENCOUNTER — Ambulatory Visit (INDEPENDENT_AMBULATORY_CARE_PROVIDER_SITE_OTHER): Payer: Medicare Other | Admitting: *Deleted

## 2017-12-27 DIAGNOSIS — I639 Cerebral infarction, unspecified: Secondary | ICD-10-CM

## 2017-12-28 NOTE — Progress Notes (Signed)
Carelink Summary Report / Loop Recorder 

## 2017-12-30 ENCOUNTER — Other Ambulatory Visit: Payer: Self-pay

## 2018-01-02 ENCOUNTER — Other Ambulatory Visit: Payer: Self-pay

## 2018-01-02 LAB — CUP PACEART REMOTE DEVICE CHECK
Date Time Interrogation Session: 20190228204038
Implantable Pulse Generator Implant Date: 20181227

## 2018-01-02 NOTE — Patient Outreach (Signed)
Telephone outreach to patient to obtain mRS was successfully completed. mRS = 0 

## 2018-01-03 DIAGNOSIS — Z961 Presence of intraocular lens: Secondary | ICD-10-CM | POA: Diagnosis not present

## 2018-01-19 ENCOUNTER — Encounter: Payer: Self-pay | Admitting: Cardiovascular Disease

## 2018-01-19 ENCOUNTER — Ambulatory Visit (INDEPENDENT_AMBULATORY_CARE_PROVIDER_SITE_OTHER): Payer: Medicare Other | Admitting: Cardiovascular Disease

## 2018-01-19 VITALS — BP 132/64 | HR 58 | Ht 62.0 in | Wt 116.6 lb

## 2018-01-19 DIAGNOSIS — I951 Orthostatic hypotension: Secondary | ICD-10-CM

## 2018-01-19 DIAGNOSIS — I10 Essential (primary) hypertension: Secondary | ICD-10-CM | POA: Diagnosis not present

## 2018-01-19 DIAGNOSIS — I639 Cerebral infarction, unspecified: Secondary | ICD-10-CM

## 2018-01-19 NOTE — Progress Notes (Signed)
Bianca Garcia Date of Birth  1940-11-08       [address]    [place]     [place], [place]  [address]      Problem list: 1. History of chest pain-normal coronary artery heart catheterization Had coronary artery calcifications by CT scan   2. Hyperlipidemia 3. Hypertension 4. Hypothyroidism 5. CVA     Pt is doing well from a cardiac standpoint.  She continues to have anxiety.  She takes Klonipin on occasion which helps.  She has not had any cardiac problems.  Feb 20, 2016:  Bianca Garcia was seen by [facility] of abdominal issues.   Got a CT scan  Was noted to incidentally found to have right coronary artery calcifications.   Does have some mild chest pressure like sensation with working outside, especially if on an incline.  Records from [facility] were reviewed.   Chol = 247 Trigs = 89 HDL = 71 LDL 158.   Is having lots of dizziness with the Buspar.    Oct. 18, 2017: She is doing well Seems to be tolerating the amlodipine  No longer has this vague chest tightness. ( ? Esophageal spasm )   January 20, 2017:  Bianca Garcia is seen today for follow-up visit. BP is doing well.  Still has some CP ,  myoview was negative in 2017.   Has had a normal cath in the past.    Saw her primary MD - tried Sertraline . She is feeling better after stopping the Atorvastatin  Doing well on the amlodipine   Oct. 25, 2018 Continues to have frequent episodes of CP  Seems to be associated frequently with anxiety . Is having some CP right now Will last for a few minuets Constant ache  No worsened with exercise or walking   Was not able to tolerate the atrovastatin   January 19, 2018: Bianca Garcia is seen today for follow-up of her hypertension, hyperlipidemia and chest pains.  She was admitted on December 25 with a stroke.  She received TPA with good functional recovery.  Transesophageal echocardiogram was unremarkable.  She has an implantable loop  recorder. Echocardiogram during that admission reveals normal left ventricular systolic function with EF of 55 to 60%.  She has grade 2 diastolic dysfunction.  She has mild pulmonary artery hypertension with estimated PA pressure of 36 mmHg.  She presented to the emergency room again on December 21, 2017 with some dizziness and left facial numbness.  Her symptoms resolved spontaneously.  MRI of the brain was negative for an acute infarction.  The patient was discharged in stable condition.  She continues to fall on occasion .    Falling episodes are much worse since the CVA .   Symptoms seem to be related to orthostatic hypotension  Chronic CP .     Current Outpatient Medications  Medication Sig Dispense Refill  . amLODipine (NORVASC) 5 MG tablet Take 0.5 tablets (2.5 mg total) by mouth daily. 45 tablet 3  . busPIRone (BUSPAR) 5 MG tablet Take 2.5 mg by mouth as needed (anxiety).    . Calcium Carbonate-Vitamin D (CALCIUM + D PO) Take by mouth daily.    . clonazePAM (KLONOPIN) 0.5 MG tablet Take 0.25 mg by mouth 2 (two) times daily as needed for anxiety.    . clopidogrel (PLAVIX) 75 MG tablet Take 1 tablet (75 mg total) by mouth daily. 30 tablet 1  . cycloSPORINE (RESTASIS) 0.05 % ophthalmic  emulsion Apply to eye.    . levothyroxine (SYNTHROID, LEVOTHROID) 88 MCG tablet Take 88 mcg by mouth daily.    . Multiple Vitamins-Minerals (PRESERVISION AREDS PO) Take 1 tablet by mouth 2 (two) times daily.     . Multiple Vitamins-Minerals (SUPER THERA VITE M) TABS Take by mouth.    . naproxen sodium (ALEVE) 220 MG tablet Take 220 mg by mouth as needed (chest pain).    . propranolol (INDERAL) 40 MG tablet Take 1 tablet (40 mg total) 2 (two) times daily by mouth. 60 tablet 11  . Propylene Glycol (SYSTANE BALANCE OP) Apply 1 drop to eye daily. Reported on 02/27/2016    . rosuvastatin (CRESTOR) 5 MG tablet Take 1 tablet (5 mg total) by mouth daily. 90 tablet 3  . simethicone (MYLICON) 80 MG chewable tablet Chew  80 mg by mouth every 6 (six) hours as needed for flatulence.     No current facility-administered medications for this visit.    Family History  Problem Relation Age of Onset  . Bladder Cancer Father   . Mitral valve prolapse Mother   . Cancer Maternal Grandfather    Smoked briefly as a teenager.   Allergies  Allergen Reactions  . Fluticasone Other (See Comments)    DRY EYES  . Codeine   . Lipitor [Atorvastatin Calcium]     Intolerant   . Sulfa Antibiotics     vomiting    Past Medical History:  Diagnosis Date  . Anxiety   . Arthritis   . Biceps tendonitis 01/05/2013  . 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 Chest pain   . Dizziness   . HTN (hypertension)    no meds  . Hypercholesterolemia   . Hypothyroid   . Hypothyroidism   . IBS (irritable bowel syndrome)   . Impingement syndrome of right shoulder 01/05/2013  . MVP (mitral valve prolapse)    Hx of   . Normal coronary arteries    by heart Cath  . Osteopenia   . Right rotator cuff tear 01/05/2013  . Seasonal allergies   . Stroke ([place])   . Tremor     Past Surgical History:  Procedure Laterality Date  . ANTERIOR CERVICAL DECOMP/DISCECTOMY FUSION  04/2015    Dr. Pamala Hurry, [place]  . CARDIAC CATHETERIZATION  2004   which revealed smooth and normal coronary arteries  . CARPAL TUNNEL RELEASE  2000   lt  . CATARACT EXTRACTION W/ INTRAOCULAR LENS  IMPLANT, BILATERAL  1/17   Dr. Lucita Ferrara  . COSMETIC SURGERY  2010   eyes-facial  . DENTAL SURGERY  1960  . LASIK    . LOOP RECORDER INSERTION N/A 09/22/2017   Procedure: LOOP RECORDER INSERTION;  Surgeon: Constance Haw, MD;  Location: [facility];  Service: Cardiovascular;  Laterality: N/A;  . SHOULDER ARTHROSCOPY WITH ROTATOR CUFF REPAIR Right 01/05/2013   Procedure: SHOULDER ARTHROSCOPY WITH ARTHROSCOPIC  ROTATOR CUFF REPAIR, ACROMIOPLASTY, EXTENSIVE DEBRIDEMENT;  Surgeon: Johnny Bridge, MD;  Location: [facility];  Service: Orthopedics;   Laterality: Right;  . TEE WITHOUT CARDIOVERSION N/A 09/22/2017   Procedure: TRANSESOPHAGEAL ECHOCARDIOGRAM (TEE);  Surgeon: Sanda Klein, MD;  Location: [facility] ENDOSCOPY;  Service: Cardiovascular;  Laterality: N/A;  . TONSILLECTOMY    . TUBAL LIGATION      Social History   Tobacco Use  Smoking Status Former Smoker  . Last attempt to quit: 01/05/1960  . Years since quitting: 58.0  Smokeless Tobacco Never Used  Tobacco Comment  Remote Hx    Social History   Substance and Sexual Activity  Alcohol Use Yes  . Frequency: Never   Comment: daily beer    Family History  Problem Relation Age of Onset  . Bladder Cancer Father   . Mitral valve prolapse Mother   . Cancer Maternal Grandfather     Reviw of Systems:  Reviewed in the HPI.  All other systems are negative.  Physical Exam: Last menstrual period 09/28/1979.  GEN:  Well nourished, well developed in no acute distress HEENT: Normal NECK: No JVD; No carotid bruits LYMPHATICS: No lymphadenopathy CARDIAC: RR, no murmurs, rubs, gallops RESPIRATORY:  Clear to auscultation without rales, wheezing or rhonchi  ABDOMEN: Soft, non-tender, non-distended MUSCULOSKELETAL:  No edema; No deformity  SKIN: Warm and dry NEUROLOGIC:  Alert and oriented x 3   ECG: Oct. 25, 2018:   NSR , TWI in aVF ( no changes from previous ECG )   Assessment / Plan:   1.  Dizziness: She is having lots of dizziness.  This might be residual deficit from her stroke.  There also may be a component of orthostatic hypotension.  We will Discontinue the amlodipine and see if this helps her symptoms.  I will see her in 3 months.   2.  Hyperlipidemia: Labs per primary MD  .     [name], MD  01/19/2018 5:51 AM    [address],  [place], [place]  [address] Pager [phone] Phone: [phone]; Fax: [phone]

## 2018-01-19 NOTE — Patient Instructions (Signed)
Medication Instructions:  Your physician has recommended you make the following change in your medication:   STOP Amlodipine (Norvasc)   Labwork: None Ordered   Testing/Procedures: None Ordered   Follow-Up: Your physician recommends that you schedule a follow-up appointment in: 3 months with Dr. Nahser   If you need a refill on your cardiac medications before your next appointment, please call your pharmacy.   Thank you for choosing [facility]! Michelle Swinyer, RN [phone]    

## 2018-01-24 ENCOUNTER — Encounter: Payer: Self-pay | Admitting: Obstetrics & Gynecology

## 2018-01-24 DIAGNOSIS — R5383 Other fatigue: Secondary | ICD-10-CM | POA: Diagnosis not present

## 2018-01-24 DIAGNOSIS — R109 Unspecified abdominal pain: Secondary | ICD-10-CM | POA: Diagnosis not present

## 2018-01-24 DIAGNOSIS — R05 Cough: Secondary | ICD-10-CM | POA: Diagnosis not present

## 2018-01-24 DIAGNOSIS — Z6821 Body mass index (BMI) 21.0-21.9, adult: Secondary | ICD-10-CM | POA: Diagnosis not present

## 2018-01-24 DIAGNOSIS — R197 Diarrhea, unspecified: Secondary | ICD-10-CM | POA: Diagnosis not present

## 2018-01-26 ENCOUNTER — Encounter: Payer: Self-pay | Admitting: Obstetrics & Gynecology

## 2018-01-26 DIAGNOSIS — R197 Diarrhea, unspecified: Secondary | ICD-10-CM | POA: Diagnosis not present

## 2018-01-30 ENCOUNTER — Ambulatory Visit (INDEPENDENT_AMBULATORY_CARE_PROVIDER_SITE_OTHER): Payer: Medicare Other | Admitting: *Deleted

## 2018-01-30 DIAGNOSIS — I639 Cerebral infarction, unspecified: Secondary | ICD-10-CM

## 2018-01-30 NOTE — Progress Notes (Signed)
Carelink Summary Report / Loop Recorder 

## 2018-02-01 DIAGNOSIS — K219 Gastro-esophageal reflux disease without esophagitis: Secondary | ICD-10-CM | POA: Diagnosis not present

## 2018-02-01 DIAGNOSIS — E038 Other specified hypothyroidism: Secondary | ICD-10-CM | POA: Diagnosis not present

## 2018-02-01 DIAGNOSIS — K589 Irritable bowel syndrome without diarrhea: Secondary | ICD-10-CM | POA: Diagnosis not present

## 2018-02-01 DIAGNOSIS — I10 Essential (primary) hypertension: Secondary | ICD-10-CM | POA: Diagnosis not present

## 2018-02-01 DIAGNOSIS — R197 Diarrhea, unspecified: Secondary | ICD-10-CM | POA: Diagnosis not present

## 2018-02-01 DIAGNOSIS — F39 Unspecified mood [affective] disorder: Secondary | ICD-10-CM | POA: Diagnosis not present

## 2018-02-01 DIAGNOSIS — Z1389 Encounter for screening for other disorder: Secondary | ICD-10-CM | POA: Diagnosis not present

## 2018-02-01 DIAGNOSIS — I25119 Atherosclerotic heart disease of native coronary artery with unspecified angina pectoris: Secondary | ICD-10-CM | POA: Diagnosis not present

## 2018-02-01 DIAGNOSIS — Z6821 Body mass index (BMI) 21.0-21.9, adult: Secondary | ICD-10-CM | POA: Diagnosis not present

## 2018-02-01 LAB — CUP PACEART REMOTE DEVICE CHECK
Date Time Interrogation Session: 20190402214016
Implantable Pulse Generator Implant Date: 20181227

## 2018-02-03 ENCOUNTER — Telehealth: Payer: Self-pay

## 2018-02-03 NOTE — Telephone Encounter (Signed)
Attached notes to file, has April appt with Dr. Acie Fredrickson

## 2018-02-16 ENCOUNTER — Telehealth: Payer: Self-pay | Admitting: Neurology

## 2018-02-16 NOTE — Telephone Encounter (Signed)
Lab evaluation showed   CBC Hg 14.9, Creat  0.9, negative C. Diff

## 2018-02-21 ENCOUNTER — Ambulatory Visit: Payer: Medicare Other | Admitting: Adult Health

## 2018-02-21 LAB — CUP PACEART REMOTE DEVICE CHECK
Date Time Interrogation Session: 20190505224029
Implantable Pulse Generator Implant Date: 20181227

## 2018-02-22 ENCOUNTER — Telehealth: Payer: Self-pay

## 2018-02-22 ENCOUNTER — Encounter: Payer: Self-pay | Admitting: Adult Health

## 2018-02-22 NOTE — Telephone Encounter (Signed)
Patient no show for appt on 02/21/2018.

## 2018-02-27 DIAGNOSIS — R1084 Generalized abdominal pain: Secondary | ICD-10-CM | POA: Diagnosis not present

## 2018-03-01 ENCOUNTER — Encounter: Payer: Self-pay | Admitting: Adult Health

## 2018-03-01 ENCOUNTER — Ambulatory Visit (INDEPENDENT_AMBULATORY_CARE_PROVIDER_SITE_OTHER): Payer: Medicare Other | Admitting: Adult Health

## 2018-03-01 VITALS — BP 102/59 | HR 66 | Ht 62.0 in | Wt 112.2 lb

## 2018-03-01 DIAGNOSIS — R42 Dizziness and giddiness: Secondary | ICD-10-CM

## 2018-03-01 DIAGNOSIS — F0631 Mood disorder due to known physiological condition with depressive features: Secondary | ICD-10-CM

## 2018-03-01 DIAGNOSIS — I69398 Other sequelae of cerebral infarction: Secondary | ICD-10-CM

## 2018-03-01 DIAGNOSIS — G459 Transient cerebral ischemic attack, unspecified: Secondary | ICD-10-CM | POA: Diagnosis not present

## 2018-03-01 DIAGNOSIS — F419 Anxiety disorder, unspecified: Secondary | ICD-10-CM

## 2018-03-01 NOTE — Progress Notes (Signed)
[facility] [address] [place]. [place] [address] [phone]       OFFICE FOLLOW UP NOTE  Bianca. Bianca Garcia Date of Birth:  1941-01-11 Medical Record Number:  [id number]   Reason for Referral:  Hospital TIA follow up  CHIEF COMPLAINT:  Chief Complaint  Patient presents with  . Follow-up    TIA follow up patient in room 9 IS ALONE     HPI: Bianca Garcia is being seen today in the office for TIA on 09/20/17. History obtained from patient and chart review. Reviewed all radiology images and labs personally.  Bianca Garcia is a 77 year old female with PMH of anxiety, hypertension, hyperlipidemia, hypothyroid, and IBS who had an episode of right-sided weakness and word finding difficulty that resolved spontaneously on 09/20/2017.  The episode did occur again and persisted until her presentation to the hospital.  CT head showed no acute findings.  As she was within the 4-1/2-hour TPA window, she received TPA at 1400 hrs. on 09/20/2017.  CTA of head and neck showed no emergent large vessel occlusion.  MRI of the head was reviewed and did not show any acute findings.  2D echo showed an EF of 55-60%.  Bilateral carotid ultrasound showed a 1-39% internal carotid artery stenosis bilaterally.  TEE was performed and showed no cardiac or aortic source of embolism.  Patient was discharged with recommendations of starting Plavix as patient was on aspirin PTA.  Loop recorder placed on 09/22/2017 as TIA was cardiogenic in nature.  LDL checked was at 71, patient advised to continue Crestor 5 mg.  A1c satisfactory at 5.6.  Patient discharged home in stable condition.   11/23/17 visit: Since discharge, patient has been doing well.  Patient has been continued to take her Crestor 5 mg without any issues of myalgias.  Patient patient continues to take Plavix as recommended at hospital discharge but the patient also continues to take aspirin 81 mg as she misunderstood discharge directions  from hospital where she was supposed to D/C aspirin 81 mg.  Patient denies increase in bleeding or bruising with both Plavix and aspirin.  Patient does state that her memory has become worse since his stroke where she is talking in a sentence and forgets what she is talking about.  Patient complains of frontal headaches that have been happening since his stroke.  She states that it is a pressure-like feeling that can also go up the back of her neck.  She states that she does feel these almost every day but they come and go throughout the day.  She has not tried any OTC treatment for these headaches at this time.  Patient also complains of mild dizziness while walking.  She denies falls.  Patient's blood pressures on the low side is satisfactory 107/58.  Patient is prescribed to be taking amlodipine and propranolol which was a PTA prescription.  Patient does not believe she continues to take propranolol but is compliant on amlodipine.  Patient does state that she has had increasing amount of lorazepam she takes that she does take half a tablet (0.25 mg) twice daily.  Patient does state that she has been under an increased amount of stress as she moved a few days prior to TIA admission.  She states that she is still trying to get house cleanout in order to put on the market and does state that this causes increased amount of stress.  Patient states that she does drink approximately 1 can of beer  nightly.  Loop recorder reviewed and it has been negative for atrial fibrillation or any heart arrhythmias so far.  Patient denies new stroke/TIA symptoms.  03/01/18 UPDATE: Patient returns today for 33-month follow-up.  Continues to take Plavix without side effects of bleeding or bruising.  Continues to take Crestor without side effects myalgias.  Blood pressure today satisfactory 102/59.  Patient continues to complain about "tight band" around her head with a ear ringing daily.  She will take a dose of her Klonopin and this  will subside.  Continues to have memory complaints as this has not resolved or improved.  States she sleeps well and exercises 2-3 times per week.  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  Patient states she also continues to have dizziness that has only mildly improved and denies sessions of physical therapy.  Loop recorder has not shown atrial fibrillation thus far.  Continues to take propranolol prescribed by Dr. Krista Blue for tremors.  Denies new or worsening stroke/TIA symptoms.    ROS:   14 system review of systems performed and negative with exception of fatigue, ringing in ears, eye pain, chest pain, memory loss, dizziness, and speech difficulty  PMH:  Past Medical History:  Diagnosis Date  . Anxiety   . Arthritis   . Biceps tendonitis 01/05/2013  . 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 Chest pain   . Dizziness   . HTN (hypertension)    no meds  . Hypercholesterolemia   . Hypothyroid   . Hypothyroidism   . IBS (irritable bowel syndrome)   . Impingement syndrome of right shoulder 01/05/2013  . MVP (mitral valve prolapse)    Hx of   . Normal coronary arteries    by heart Cath  . Osteopenia   . Right rotator cuff tear 01/05/2013  . Seasonal allergies   . Stroke ([place])   . Tremor     PSH:  Past Surgical History:  Procedure Laterality Date  . ANTERIOR CERVICAL DECOMP/DISCECTOMY FUSION  04/2015    Dr. Pamala Hurry, [place]  . CARDIAC CATHETERIZATION  2004   which revealed smooth and normal coronary arteries  . CARPAL TUNNEL RELEASE  2000   lt  . CATARACT EXTRACTION W/ INTRAOCULAR LENS  IMPLANT, BILATERAL  1/17   Dr. Lucita Ferrara  . COSMETIC SURGERY  2010   eyes-facial  . DENTAL SURGERY  1960  . LASIK    . LOOP RECORDER INSERTION N/A 09/22/2017   Procedure: LOOP RECORDER INSERTION;  Surgeon: Constance Haw, MD;  Location: [place] [facility];  Service:  Cardiovascular;  Laterality: N/A;  . SHOULDER ARTHROSCOPY WITH ROTATOR CUFF REPAIR Right 01/05/2013   Procedure: SHOULDER ARTHROSCOPY WITH ARTHROSCOPIC  ROTATOR CUFF REPAIR, ACROMIOPLASTY, EXTENSIVE DEBRIDEMENT;  Surgeon: Johnny Bridge, MD;  Location: [place];  Service: Orthopedics;  Laterality: Right;  . TEE WITHOUT CARDIOVERSION N/A 09/22/2017   Procedure: TRANSESOPHAGEAL ECHOCARDIOGRAM (TEE);  Surgeon: Sanda Klein, MD;  Location: [facility];  Service: Cardiovascular;  Laterality: N/A;  . TONSILLECTOMY    . TUBAL LIGATION      Social History:  Social History   Socioeconomic History  . Marital status: Married    Spouse name: Not on file  . Number of children: 2  . Years of education: 16 years  . Highest education level: Bachelor's degree (e.g., BA, AB, BS)  Occupational History  . Occupation: Retired  Science writer  Needs  . Financial resource strain: Not on file  . Food insecurity:    Worry: Not on file    Inability: Not on file  . Transportation needs:    Medical: Not on file    Non-medical: Not on file  Tobacco Use  . Smoking status: Former Smoker    Last attempt to quit: 01/05/1960    Years since quitting: 58.1  . Smokeless tobacco: Never Used  . Tobacco comment: Remote Hx  Substance and Sexual Activity  . Alcohol use: Yes    Frequency: Never    Comment: daily beer  . Drug use: No  . Sexual activity: Never    Birth control/protection: Post-menopausal  Lifestyle  . Physical activity:    Days per week: Not on file    Minutes per session: Not on file  . Stress: Not on file  Relationships  . Social connections:    Talks on phone: Not on file    Gets together: Not on file    Attends religious service: Not on file    Active member of club or organization: Not on file    Attends meetings of clubs or organizations: Not on file    Relationship status: Not on file  . Intimate partner violence:    Fear of current or ex partner: Not on file     Emotionally abused: Not on file    Physically abused: Not on file    Forced sexual activity: Not on file  Other Topics Concern  . Not on file  Social History Narrative   Lives at home with her husband.   Left-handed.   Occasional caffeine use.    Family History:  Family History  Problem Relation Age of Onset  . Bladder Cancer Father   . Mitral valve prolapse Mother   . Cancer Maternal Grandfather     Medications:   Current Outpatient Medications on File Prior to Visit  Medication Sig Dispense Refill  . Calcium Carbonate-Vitamin D (CALCIUM + D PO) Take by mouth daily.    . clonazePAM (KLONOPIN) 0.5 MG tablet Take 0.25 mg by mouth 2 (two) times daily as needed for anxiety.    . clopidogrel (PLAVIX) 75 MG tablet Take 1 tablet (75 mg total) by mouth daily. 30 tablet 1  . cycloSPORINE (RESTASIS) 0.05 % ophthalmic emulsion Apply to eye.    . levothyroxine (SYNTHROID, LEVOTHROID) 88 MCG tablet Take 88 mcg by mouth daily.    . Multiple Vitamins-Minerals (PRESERVISION AREDS PO) Take 1 tablet by mouth 2 (two) times daily.     . Multiple Vitamins-Minerals (SUPER THERA VITE M) TABS Take by mouth.    . naproxen sodium (ALEVE) 220 MG tablet Take 220 mg by mouth as needed (chest pain).    . propranolol (INDERAL) 40 MG tablet Take 1 tablet (40 mg total) 2 (two) times daily by mouth. 60 tablet 11  . Propylene Glycol (SYSTANE BALANCE OP) Apply 1 drop to eye daily. Reported on 02/27/2016    . rosuvastatin (CRESTOR) 5 MG tablet Take 1 tablet (5 mg total) by mouth daily. 90 tablet 3  . simethicone (MYLICON) 80 MG chewable tablet Chew 80 mg by mouth every 6 (six) hours as needed for flatulence.     No current facility-administered medications on file prior to visit.     Allergies:   Allergies  Allergen Reactions  . Fluticasone Other (See Comments)    DRY EYES  . Codeine Nausea Only  . Lipitor [Atorvastatin Calcium]  Intolerant   . Sulfa Antibiotics     vomiting    Physical  Exam  Vitals:   03/01/18 1444  BP: (!) 102/59  Pulse: 66  Weight: 112 lb 3.2 oz (50.9 kg)  Height: 5\' 2"  (1.575 m)   Body mass index is 20.52 kg/m. No exam data present  General: Frail petite, elderly Caucasian female well nourished, seated, in no evident distress Head: head normocephalic and atraumatic.   Neck: supple with no carotid or supraclavicular bruits Cardiovascular: regular rate and rhythm, no murmurs Musculoskeletal: no deformity Skin:  no rash/petichiae Vascular:  Normal pulses all extremities  Neurologic Exam Mental Status: Awake and fully alert. Oriented to place and time. Remote memory intact. Attention span, concentration and fund of knowledge appropriate. Mood and affect appropriate.  Cranial Nerves: Fundoscopic exam reveals sharp disc margins. Pupils equal, briskly reactive to light. Extraocular movements full without nystagmus. Visual fields full to confrontation. Hearing intact. Facial sensation intact. Face, tongue, palate moves normally and symmetrically.  Motor: Normal bulk and tone. Normal strength in all tested extremity muscles. Sensory.: intact to touch , pinprick , position and vibratory sensation.  Coordination: Rapid alternating movements normal in all extremities. Finger-to-nose and heel-to-shin performed accurately bilaterally. Gait and Station: Arises from chair without difficulty. Stance is normal. Gait demonstrates normal stride length and balance . Able to heel, toe and tandem walk without difficulty.  Reflexes: 1+ and symmetric. Toes downgoing.     Diagnostic Data (Labs, Imaging, Testing)  Ct Angio Headand neck and perfusionW Or Wo Contrast Result Date: 09/20/2017 IMPRESSION: 1. No emergent large vessel occlusion.No infarct or penumbra by CT perfusion. 2.Atherosclerosis without major vessel flow limiting stenosisin the head or neck. 3. 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  Moderate calcified plaque at theLICA bulb.No flow  limiting stenosis or ulceration. Mild atheromatous plaque at the bifurcationRICA,No stenosis or ulceration.  Dg Chest 2 View Result Date: 09/20/2017 IMPRESSION: No active cardiopulmonary disease.  Ct Head Code Stroke Wo Contrast Result Date: 09/20/2017 IMPRESSION: No acute finding.ASPECTS is 10.  B/L Carotid U/S:1-39% internal carotid artery stenosis bilaterally. Vertebral arteries are patent with antegrade flow.  Echocardiogram: Study Conclusions - Left ventricle: The cavity size was normal. Wall thickness was normal. Systolic function was normal. The estimated ejection fraction was in the range of 55% to 60%. Wall motion was normal; there were no regional wall motion abnormalities. Features are consistent with a pseudonormal left ventricular filling pattern, with concomitant abnormal relaxation and increased filling pressure (grade 2 diastolic dysfunction). - Aortic valve: There was trivial regurgitation. - Mitral valve: There was mild regurgitation. - Pulmonary arteries: Systolic pressure was mildly increased. PA peak pressure: 36 mm Hg (S). Impressions: - Normal LV systolic function; moderate diastolic dysfunction; trace AI; mild MR and TR; mildly elevated pulmonary pressure  MRI Head: IMPRESSION: No acute finding by MRI. Mild to moderate chronic small-vessel ischemic changes of the cerebral hemispheric white matter with central volume loss.  TEE: Normal TEE. No cardiac or aortic source of embolism was identified      ASSESSMENT: Bianca Garcia is a  77 y.o. year old female presented with left hemispheric infarct symptoms on 09/20/2017. And was treated with IV TPA with excellent clinical recovery and no infarct noted on MRI  Patient had a loop recorder placed prior to discharge from hospital and no episodes of A. fib have been found at this point.  Vascular risk factors include hypertension, hyperlipidemia, and hypothyroidism.  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    PLAN: -Continue clopidogrel 75 mg daily  and Crestor for secondary stroke prevention -Referral for psychiatry to help with post stroke depression, anxiety and medication management -Referral for PT for continued dizziness and imbalance issues -F/u with PCP regarding your HLD and HTN management -continue to monitor BP at home -stress reduction - symptoms of dizziness, pressure/throbbing frontal and neck headache, and worsening memory may all possibly be because by increased stress as patient did not have an infarct on her MRI.  Patient was advised to perform stress reduction exercises like meditation, yoga and regular exercises. -Maintain strict control of hypertension with blood pressure goal below 130/90, diabetes with hemoglobin A1c goal below 6.5% and cholesterol with LDL cholesterol (bad cholesterol) goal below 70 mg/dL. I also advised the patient to eat a healthy diet with plenty of whole grains, cereals, fruits and vegetables, exercise regularly and maintain ideal body weight.  Patient is to schedule appointment with Dr. Krista Blue in the fall which will be approximately 1 year out from previous visit for continued propranolol prescription for tremors or call earlier if needed  Greater than 50% of time during this 25  minute visit was spent on counseling,explanation of diagnosis TIA and stroke, planning of further management, discussion with patient and family and coordination of care.   Venancio Poisson, AGNP-BC  [facility] [address] [place], [place] [address]  Phone [phone] Fax [phone]

## 2018-03-01 NOTE — Patient Instructions (Signed)
Continue clopidogrel 75 mg daily  and crestor  for secondary stroke prevention  Referral placed for psychiatry to help with medication management  Referral placed for physical therapy for dizziness and imbalance issues  Continue to follow up with PCP regarding cholesterol and blood pressure management   Continue to monitor blood pressure at home  Maintain strict control of hypertension with blood pressure goal below 130/90, diabetes with hemoglobin A1c goal below 6.5% and cholesterol with LDL cholesterol (bad cholesterol) goal below 70 mg/dL. I also advised the patient to eat a healthy diet with plenty of whole grains, cereals, fruits and vegetables, exercise regularly and maintain ideal body weight.  Followup with Dr. Krista Blue in the fall for tremor mamangement or call earlier if needed          Thank you for coming to see [name] at [facility]. I hope we have been able to provide you high quality care today.  You may receive a patient satisfaction survey over the next few weeks. We would appreciate your feedback and comments so that we may continue to improve ourselves and the health of our patients.

## 2018-03-03 ENCOUNTER — Ambulatory Visit (INDEPENDENT_AMBULATORY_CARE_PROVIDER_SITE_OTHER): Payer: Medicare Other | Admitting: *Deleted

## 2018-03-03 DIAGNOSIS — I639 Cerebral infarction, unspecified: Secondary | ICD-10-CM | POA: Diagnosis not present

## 2018-03-03 NOTE — Progress Notes (Signed)
I agree with the above plan 

## 2018-03-06 NOTE — Progress Notes (Signed)
Carelink Summary Report / Loop Recorder 

## 2018-03-16 DIAGNOSIS — Z8673 Personal history of transient ischemic attack (TIA), and cerebral infarction without residual deficits: Secondary | ICD-10-CM | POA: Diagnosis not present

## 2018-03-16 DIAGNOSIS — R42 Dizziness and giddiness: Secondary | ICD-10-CM | POA: Diagnosis not present

## 2018-03-16 DIAGNOSIS — F411 Generalized anxiety disorder: Secondary | ICD-10-CM | POA: Diagnosis not present

## 2018-03-16 DIAGNOSIS — H3530 Unspecified macular degeneration: Secondary | ICD-10-CM | POA: Diagnosis not present

## 2018-03-16 DIAGNOSIS — I10 Essential (primary) hypertension: Secondary | ICD-10-CM | POA: Diagnosis not present

## 2018-03-16 DIAGNOSIS — I951 Orthostatic hypotension: Secondary | ICD-10-CM | POA: Diagnosis not present

## 2018-03-16 DIAGNOSIS — G458 Other transient cerebral ischemic attacks and related syndromes: Secondary | ICD-10-CM | POA: Diagnosis not present

## 2018-03-16 DIAGNOSIS — R278 Other lack of coordination: Secondary | ICD-10-CM | POA: Diagnosis not present

## 2018-03-20 DIAGNOSIS — F411 Generalized anxiety disorder: Secondary | ICD-10-CM | POA: Diagnosis not present

## 2018-03-20 DIAGNOSIS — H3530 Unspecified macular degeneration: Secondary | ICD-10-CM | POA: Diagnosis not present

## 2018-03-20 DIAGNOSIS — R42 Dizziness and giddiness: Secondary | ICD-10-CM | POA: Diagnosis not present

## 2018-03-20 DIAGNOSIS — R278 Other lack of coordination: Secondary | ICD-10-CM | POA: Diagnosis not present

## 2018-03-20 DIAGNOSIS — I10 Essential (primary) hypertension: Secondary | ICD-10-CM | POA: Diagnosis not present

## 2018-03-20 DIAGNOSIS — G458 Other transient cerebral ischemic attacks and related syndromes: Secondary | ICD-10-CM | POA: Diagnosis not present

## 2018-03-23 DIAGNOSIS — I10 Essential (primary) hypertension: Secondary | ICD-10-CM | POA: Diagnosis not present

## 2018-03-23 DIAGNOSIS — F411 Generalized anxiety disorder: Secondary | ICD-10-CM | POA: Diagnosis not present

## 2018-03-23 DIAGNOSIS — R278 Other lack of coordination: Secondary | ICD-10-CM | POA: Diagnosis not present

## 2018-03-23 DIAGNOSIS — R42 Dizziness and giddiness: Secondary | ICD-10-CM | POA: Diagnosis not present

## 2018-03-23 DIAGNOSIS — H3530 Unspecified macular degeneration: Secondary | ICD-10-CM | POA: Diagnosis not present

## 2018-03-23 DIAGNOSIS — G458 Other transient cerebral ischemic attacks and related syndromes: Secondary | ICD-10-CM | POA: Diagnosis not present

## 2018-03-29 DIAGNOSIS — F411 Generalized anxiety disorder: Secondary | ICD-10-CM | POA: Diagnosis not present

## 2018-03-29 DIAGNOSIS — I10 Essential (primary) hypertension: Secondary | ICD-10-CM | POA: Diagnosis not present

## 2018-03-29 DIAGNOSIS — I951 Orthostatic hypotension: Secondary | ICD-10-CM | POA: Diagnosis not present

## 2018-03-29 DIAGNOSIS — G458 Other transient cerebral ischemic attacks and related syndromes: Secondary | ICD-10-CM | POA: Diagnosis not present

## 2018-03-29 DIAGNOSIS — Z8673 Personal history of transient ischemic attack (TIA), and cerebral infarction without residual deficits: Secondary | ICD-10-CM | POA: Diagnosis not present

## 2018-03-29 DIAGNOSIS — R278 Other lack of coordination: Secondary | ICD-10-CM | POA: Diagnosis not present

## 2018-03-29 DIAGNOSIS — H3530 Unspecified macular degeneration: Secondary | ICD-10-CM | POA: Diagnosis not present

## 2018-03-29 DIAGNOSIS — R42 Dizziness and giddiness: Secondary | ICD-10-CM | POA: Diagnosis not present

## 2018-04-03 DIAGNOSIS — R42 Dizziness and giddiness: Secondary | ICD-10-CM | POA: Diagnosis not present

## 2018-04-03 DIAGNOSIS — R278 Other lack of coordination: Secondary | ICD-10-CM | POA: Diagnosis not present

## 2018-04-03 DIAGNOSIS — I10 Essential (primary) hypertension: Secondary | ICD-10-CM | POA: Diagnosis not present

## 2018-04-03 DIAGNOSIS — G458 Other transient cerebral ischemic attacks and related syndromes: Secondary | ICD-10-CM | POA: Diagnosis not present

## 2018-04-03 DIAGNOSIS — F411 Generalized anxiety disorder: Secondary | ICD-10-CM | POA: Diagnosis not present

## 2018-04-03 DIAGNOSIS — H3530 Unspecified macular degeneration: Secondary | ICD-10-CM | POA: Diagnosis not present

## 2018-04-05 ENCOUNTER — Ambulatory Visit (INDEPENDENT_AMBULATORY_CARE_PROVIDER_SITE_OTHER): Payer: Medicare Other | Admitting: *Deleted

## 2018-04-05 DIAGNOSIS — I639 Cerebral infarction, unspecified: Secondary | ICD-10-CM

## 2018-04-06 DIAGNOSIS — I10 Essential (primary) hypertension: Secondary | ICD-10-CM | POA: Diagnosis not present

## 2018-04-06 DIAGNOSIS — R278 Other lack of coordination: Secondary | ICD-10-CM | POA: Diagnosis not present

## 2018-04-06 DIAGNOSIS — F411 Generalized anxiety disorder: Secondary | ICD-10-CM | POA: Diagnosis not present

## 2018-04-06 DIAGNOSIS — H3530 Unspecified macular degeneration: Secondary | ICD-10-CM | POA: Diagnosis not present

## 2018-04-06 DIAGNOSIS — R42 Dizziness and giddiness: Secondary | ICD-10-CM | POA: Diagnosis not present

## 2018-04-06 DIAGNOSIS — G458 Other transient cerebral ischemic attacks and related syndromes: Secondary | ICD-10-CM | POA: Diagnosis not present

## 2018-04-06 NOTE — Progress Notes (Signed)
Carelink Summary Report / Loop Recorder 

## 2018-04-10 LAB — CUP PACEART REMOTE DEVICE CHECK
Date Time Interrogation Session: 20190607233804
Implantable Pulse Generator Implant Date: 20181227

## 2018-04-13 DIAGNOSIS — H3530 Unspecified macular degeneration: Secondary | ICD-10-CM | POA: Diagnosis not present

## 2018-04-13 DIAGNOSIS — I10 Essential (primary) hypertension: Secondary | ICD-10-CM | POA: Diagnosis not present

## 2018-04-13 DIAGNOSIS — G458 Other transient cerebral ischemic attacks and related syndromes: Secondary | ICD-10-CM | POA: Diagnosis not present

## 2018-04-13 DIAGNOSIS — R42 Dizziness and giddiness: Secondary | ICD-10-CM | POA: Diagnosis not present

## 2018-04-13 DIAGNOSIS — R278 Other lack of coordination: Secondary | ICD-10-CM | POA: Diagnosis not present

## 2018-04-13 DIAGNOSIS — F411 Generalized anxiety disorder: Secondary | ICD-10-CM | POA: Diagnosis not present

## 2018-04-14 ENCOUNTER — Other Ambulatory Visit: Payer: Self-pay | Admitting: Neurology

## 2018-04-27 LAB — HM PAP SMEAR

## 2018-05-04 ENCOUNTER — Ambulatory Visit (INDEPENDENT_AMBULATORY_CARE_PROVIDER_SITE_OTHER): Payer: Medicare Other | Admitting: Obstetrics & Gynecology

## 2018-05-04 ENCOUNTER — Encounter: Payer: Self-pay | Admitting: Obstetrics & Gynecology

## 2018-05-04 VITALS — BP 106/70 | HR 64 | Resp 14 | Ht 60.5 in | Wt 115.0 lb

## 2018-05-04 DIAGNOSIS — Z01419 Encounter for gynecological examination (general) (routine) without abnormal findings: Secondary | ICD-10-CM

## 2018-05-04 DIAGNOSIS — Z124 Encounter for screening for malignant neoplasm of cervix: Secondary | ICD-10-CM

## 2018-05-04 NOTE — Progress Notes (Signed)
77 y.o. [id number] MarriedCaucasianF here for annual exam.  Had a TIA in December.  This has changed her memory.    Moved from home as they had an opportunity to move into [place].  Had about a month to move as a space opened.  Home has not sold yet.  This is stressful.  Didn't really need to downsize.  Feels [facility] home is better use of space than her old home.  Really happy with the move.  Patient's last menstrual period was 09/28/1979.          Sexually active: No.  The current method of family planning is post menopausal status.    Exercising: Yes.    walking Smoker:  no  Health Maintenance: Pap:  02/27/16 Neg   04/30/14 Neg  History of abnormal Pap:  no MMG: 05/23/17 BIRADS1:neg. has appt 05/31/18  Colonoscopy:  2008 f/u 5 years.    BMD:   2011 Osteopenia  TDaP:  PCP Pneumonia vaccine(s):  Unsure  Shingrix: No Hep C testing: n/a Screening Labs: PCP   reports that she quit smoking about 58 years ago. She has never used smokeless tobacco. She reports that she drinks about 7.0 standard drinks of alcohol per week. She reports that she does not use drugs.  Past Medical History:  Diagnosis Date  . Anxiety   . Arthritis   . Biceps tendonitis 01/05/2013  . 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 Chest pain   . Dizziness   . HTN (hypertension)    no meds  . Hypercholesterolemia   . Hypothyroid   . Hypothyroidism   . IBS (irritable bowel syndrome)   . Impingement syndrome of right shoulder 01/05/2013  . MVP (mitral valve prolapse)    Hx of   . Normal coronary arteries    by heart Cath  . Osteopenia   . Right rotator cuff tear 01/05/2013  . Seasonal allergies   . Stroke ([place])   . Tremor     Past Surgical History:  Procedure Laterality Date  . ANTERIOR CERVICAL DECOMP/DISCECTOMY FUSION  04/2015    Dr. Pamala Hurry, [place]  . CARDIAC CATHETERIZATION  2004   which revealed smooth and normal coronary arteries  . CARPAL TUNNEL RELEASE  2000   lt  . CATARACT EXTRACTION W/ INTRAOCULAR  LENS  IMPLANT, BILATERAL  1/17   Dr. Lucita Ferrara  . COSMETIC SURGERY  2010   eyes-facial  . DENTAL SURGERY  1960  . LASIK    . LOOP RECORDER INSERTION N/A 09/22/2017   Procedure: LOOP RECORDER INSERTION;  Surgeon: Constance Haw, MD;  Location: [place] [facility];  Service: Cardiovascular;  Laterality: N/A;  . SHOULDER ARTHROSCOPY WITH ROTATOR CUFF REPAIR Right 01/05/2013   Procedure: SHOULDER ARTHROSCOPY WITH ARTHROSCOPIC  ROTATOR CUFF REPAIR, ACROMIOPLASTY, EXTENSIVE DEBRIDEMENT;  Surgeon: Johnny Bridge, MD;  Location: [place];  Service: Orthopedics;  Laterality: Right;  . TEE WITHOUT CARDIOVERSION N/A 09/22/2017   Procedure: TRANSESOPHAGEAL ECHOCARDIOGRAM (TEE);  Surgeon: Sanda Klein, MD;  Location: [facility];  Service: Cardiovascular;  Laterality: N/A;  . TONSILLECTOMY    . TUBAL LIGATION      Current Outpatient Medications  Medication Sig Dispense Refill  . Calcium Carbonate-Vitamin D (CALCIUM + D PO) Take by mouth daily.    . clonazePAM (KLONOPIN) 0.5 MG tablet Take 0.25 mg by mouth 2 (two) times daily as needed for anxiety.    . cycloSPORINE (RESTASIS) 0.05 % ophthalmic emulsion Apply to eye.    . levothyroxine (SYNTHROID,  LEVOTHROID) 88 MCG tablet Take 88 mcg by mouth daily.    . Multiple Vitamins-Minerals (PRESERVISION AREDS PO) Take 1 tablet by mouth 2 (two) times daily.     . naproxen sodium (ALEVE) 220 MG tablet Take 220 mg by mouth as needed (chest pain).    . propranolol (INDERAL) 40 MG tablet Take 1 tablet (40 mg total) 2 (two) times daily by mouth. 60 tablet 11  . Propylene Glycol (SYSTANE BALANCE OP) Apply 1 drop to eye daily. Reported on 02/27/2016    . simethicone (MYLICON) 80 MG chewable tablet Chew 80 mg by mouth every 6 (six) hours as needed for flatulence.    Marland Kitchen amLODipine (NORVASC) 5 MG tablet Take 2.5 mg by mouth daily.  2  . clopidogrel (PLAVIX) 75 MG tablet Take 1 tablet (75 mg total) by mouth daily. 30 tablet 1  . rosuvastatin  (CRESTOR) 5 MG tablet Take 1 tablet (5 mg total) by mouth daily. 90 tablet 3   No current facility-administered medications for this visit.     Family History  Problem Relation Age of Onset  . Bladder Cancer Father   . Mitral valve prolapse Mother   . Cancer Maternal Grandfather     Review of Systems  All other systems reviewed and are negative.   Exam:   BP 106/70 (BP Location: Right Arm, Patient Position: Sitting, Cuff Size: Normal)   Pulse 64   Resp 14   Ht 5' 0.5" (1.537 m)   Wt 115 lb (52.2 kg)   LMP 09/28/1979   BMI 22.09 kg/m    Height: 5' 0.5" (153.7 cm)  Ht Readings from Last 3 Encounters:  05/04/18 5' 0.5" (1.537 m)  03/01/18 5\' 2"  (1.575 m)  01/19/18 5\' 2"  (1.575 m)    General appearance: alert, cooperative and appears stated age Head: Normocephalic, without obvious abnormality, atraumatic Neck: no adenopathy, supple, symmetrical, trachea midline and thyroid normal to inspection and palpation Lungs: clear to auscultation bilaterally Breasts: normal appearance, no masses or tenderness, leep recorded noted under skin on left breast Heart: regular rate and rhythm Abdomen: soft, non-tender; bowel sounds normal; no masses,  no organomegaly Extremities: extremities normal, atraumatic, no cyanosis or edema Skin: Skin color, texture, turgor normal. No rashes or lesions Lymph nodes: Cervical, supraclavicular, and axillary nodes normal. No abnormal inguinal nodes palpated Neurologic: Grossly normal   Pelvic: External genitalia:  no lesions              Urethra:  normal appearing urethra with no masses, tenderness or lesions              Bartholins and Skenes: normal                 Vagina: normal appearing vagina with normal color and discharge, no lesions              Cervix: no lesions              Pap taken: No. Bimanual Exam:  Uterus:  normal size, contour, position, consistency, mobility, non-tender              Adnexa: normal adnexa and no mass, fullness,  tenderness               Rectovaginal: Confirms               Anus:  normal sphincter tone, no lesions  Chaperone was present for exam.  A:  Well Woman with normal exam PMP, no HRT H/O  IBS Atherosclerosis and abdominal aorta, followed by Dr. Cathie Olden Hypohtyroidism TIA 12/18 Osteopenia  P:   Mammogram guidelines reviewed.  She has appt scheduled in September pap smear not indicated due to age Lab work and vaccines are done with Dr. Leta Jungling to pt to write down all of pertinent medical information in a booklet for her if desired so she can keep this with her all of the time.  She will consider. return annually or prn

## 2018-05-04 NOTE — Patient Instructions (Addendum)
Double check to see if you are taking an asprin.  If you are, stop it.  You don't need it the Plavix (clopidogrel).

## 2018-05-05 DIAGNOSIS — D3131 Benign neoplasm of right choroid: Secondary | ICD-10-CM | POA: Diagnosis not present

## 2018-05-05 DIAGNOSIS — H04123 Dry eye syndrome of bilateral lacrimal glands: Secondary | ICD-10-CM | POA: Diagnosis not present

## 2018-05-05 DIAGNOSIS — H353132 Nonexudative age-related macular degeneration, bilateral, intermediate dry stage: Secondary | ICD-10-CM | POA: Diagnosis not present

## 2018-05-05 DIAGNOSIS — Z961 Presence of intraocular lens: Secondary | ICD-10-CM | POA: Diagnosis not present

## 2018-05-05 DIAGNOSIS — Z9841 Cataract extraction status, right eye: Secondary | ICD-10-CM | POA: Diagnosis not present

## 2018-05-05 DIAGNOSIS — H43393 Other vitreous opacities, bilateral: Secondary | ICD-10-CM | POA: Diagnosis not present

## 2018-05-05 DIAGNOSIS — Z79899 Other long term (current) drug therapy: Secondary | ICD-10-CM | POA: Diagnosis not present

## 2018-05-05 DIAGNOSIS — Z87891 Personal history of nicotine dependence: Secondary | ICD-10-CM | POA: Diagnosis not present

## 2018-05-05 DIAGNOSIS — Z8673 Personal history of transient ischemic attack (TIA), and cerebral infarction without residual deficits: Secondary | ICD-10-CM | POA: Diagnosis not present

## 2018-05-05 DIAGNOSIS — Z9842 Cataract extraction status, left eye: Secondary | ICD-10-CM | POA: Diagnosis not present

## 2018-05-08 ENCOUNTER — Ambulatory Visit (INDEPENDENT_AMBULATORY_CARE_PROVIDER_SITE_OTHER): Payer: Medicare Other | Admitting: *Deleted

## 2018-05-08 DIAGNOSIS — I639 Cerebral infarction, unspecified: Secondary | ICD-10-CM | POA: Diagnosis not present

## 2018-05-09 NOTE — Progress Notes (Signed)
Carelink Summary Report / Loop Recorder 

## 2018-05-11 ENCOUNTER — Other Ambulatory Visit: Payer: Self-pay | Admitting: Neurology

## 2018-05-11 LAB — CUP PACEART REMOTE DEVICE CHECK
Date Time Interrogation Session: 20190711000725
Implantable Pulse Generator Implant Date: 20181227

## 2018-05-15 DIAGNOSIS — Z23 Encounter for immunization: Secondary | ICD-10-CM | POA: Diagnosis not present

## 2018-05-25 ENCOUNTER — Ambulatory Visit (INDEPENDENT_AMBULATORY_CARE_PROVIDER_SITE_OTHER): Payer: Medicare Other | Admitting: Cardiovascular Disease

## 2018-05-25 ENCOUNTER — Encounter: Payer: Self-pay | Admitting: Cardiovascular Disease

## 2018-05-25 VITALS — BP 132/76 | HR 60 | Ht 60.6 in | Wt 117.4 lb

## 2018-05-25 DIAGNOSIS — I10 Essential (primary) hypertension: Secondary | ICD-10-CM | POA: Diagnosis not present

## 2018-05-25 DIAGNOSIS — I639 Cerebral infarction, unspecified: Secondary | ICD-10-CM | POA: Diagnosis not present

## 2018-05-25 DIAGNOSIS — I951 Orthostatic hypotension: Secondary | ICD-10-CM

## 2018-05-25 NOTE — Patient Instructions (Signed)
Medication Instructions:  Your physician has recommended you make the following change in your medication:   STOP Amlodipine (Norvasc)   Labwork: None Ordered   Testing/Procedures: None Ordered   Follow-Up: Your physician recommends that you schedule a follow-up appointment in: 3 months with Dr. Acie Fredrickson   If you need a refill on your cardiac medications before your next appointment, please call your pharmacy.   Thank you for choosing [facility]! Christen Bame, RN [phone]

## 2018-05-25 NOTE — Progress Notes (Signed)
Ernie Hew Date of Birth  1941-07-16       [address]    [place]     [place],   [address]      Problem list: 1. History of chest pain-normal coronary artery heart catheterization Had coronary artery calcifications by CT scan   2. Hyperlipidemia 3. Hypertension 4. Hypothyroidism 5. CVA     Pt is doing well from a cardiac standpoint.  She continues to have anxiety.  She takes Klonipin on occasion which helps.  She has not had any cardiac problems.  Feb 20, 2016:  [name] was seen by [place] of abdominal issues.   Got a CT scan  Was noted to incidentally found to have right coronary artery calcifications.   Does have some mild chest pressure like sensation with working outside, especially if on an incline.  Records from [place] were reviewed.   Chol = 247 Trigs = 89 HDL = 71 LDL 158.   Is having lots of dizziness with the Buspar.    Oct. 18, 2017: She is doing well Seems to be tolerating the amlodipine  No longer has this vague chest tightness. ( ? Esophageal spasm )   January 20, 2017:  [name] is seen today for follow-up visit. BP is doing well.  Still has some CP ,  myoview was negative in 2017.   Has had a normal cath in the past.    Saw her primary MD - tried Sertraline . She is feeling better after stopping the Atorvastatin  Doing well on the amlodipine   Oct. 25, 2018 Continues to have frequent episodes of CP  Seems to be associated frequently with anxiety . Is having some CP right now Will last for a few minuets Constant ache  No worsened with exercise or walking   Was not able to tolerate the atrovastatin   January 19, 2018: [name] is seen today for follow-up of her hypertension, hyperlipidemia and chest pains.  She was admitted on December 25 with a stroke.  She received TPA with good functional recovery.  Transesophageal echocardiogram was unremarkable.  She has an implantable loop  recorder. Echocardiogram during that admission reveals normal left ventricular systolic function with EF of 55 to 60%.  She has grade 2 diastolic dysfunction.  She has mild pulmonary artery hypertension with estimated PA pressure of 36 mmHg.  She presented to the emergency room again on December 21, 2017 with some dizziness and left facial numbness.  Her symptoms resolved spontaneously.  MRI of the brain was negative for an acute infarction.  The patient was discharged in stable condition.  She continues to fall on occasion .    Falling episodes are much worse since the CVA .   Symptoms seem to be related to orthostatic hypotension  Chronic CP .   May 25, 2017:  [name] seen today for follow-up visit.  She has a history of chest pain.  She is had heart catheterization in the past which did not reveal any significant coronary artery disease.  She had a stroke on September 20, 2017: She had TPA with resolution of her symptoms.  She was started on aspirin and Plavix.  Is in [place].   Is under lots of stress  Has constant chest pain , also has a headache , has had difficulty getting her words out since her stroke  Very fatigued .   Coronary CT angiogram performed in December, 2018 revealed very mild  plaque in the proximal LAD.  There is no significant stenosis.  Her calcium score is 74 Agatston units placing the patient in the 51st percentile for age and gender  Having lots of orthostatic symptoms , Still eats lots of salty foods ( salty foods )   Current Outpatient Medications  Medication Sig Dispense Refill  . amLODipine (NORVASC) 5 MG tablet Take 2.5 mg by mouth daily.  2  . Calcium Carbonate-Vitamin D (CALCIUM + D PO) Take by mouth daily.    . clonazePAM (KLONOPIN) 0.5 MG tablet Take 0.25 mg by mouth 2 (two) times daily as needed for anxiety.    . clopidogrel (PLAVIX) 75 MG tablet Take 1 tablet (75 mg total) by mouth daily. 30 tablet 1  . cycloSPORINE (RESTASIS) 0.05 % ophthalmic  emulsion Apply to eye.    . levothyroxine (SYNTHROID, LEVOTHROID) 88 MCG tablet Take 88 mcg by mouth daily.    . Multiple Vitamins-Minerals (PRESERVISION AREDS PO) Take 1 tablet by mouth 2 (two) times daily.     . naproxen sodium (ALEVE) 220 MG tablet Take 220 mg by mouth as needed (chest pain).    . propranolol (INDERAL) 40 MG tablet Take 1 tablet (40 mg total) 2 (two) times daily by mouth. 60 tablet 11  . Propylene Glycol (SYSTANE BALANCE OP) Apply 1 drop to eye daily. Reported on 02/27/2016    . rosuvastatin (CRESTOR) 5 MG tablet Take 1 tablet (5 mg total) by mouth daily. 90 tablet 3  . simethicone (MYLICON) 80 MG chewable tablet Chew 80 mg by mouth every 6 (six) hours as needed for flatulence.     No current facility-administered medications for this visit.    Family History  Problem Relation Age of Onset  . Bladder Cancer Father   . Mitral valve prolapse Mother   . Cancer Maternal Grandfather    Smoked briefly as a teenager.   Allergies  Allergen Reactions  . Fluticasone Other (See Comments)    DRY EYES  . Codeine Nausea Only  . Lipitor [Atorvastatin Calcium]     Intolerant   . Sulfa Antibiotics     vomiting    Past Medical History:  Diagnosis Date  . Adverse reaction to female HRT   . Anxiety   . Arthritis   . Biceps tendonitis 01/05/2013  . 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 Chest pain   . CTS (carpal tunnel syndrome) 1995  . Dizziness   . DJD (degenerative joint disease) 2016   DDD with spinal stenosis. Street of back injections by Dr. Brien Few 2016 with good relief, history of cervical DD with possiblity of surgery by Dr. Trenton Gammon.  Marland Kitchen GERD (gastroesophageal reflux disease)    IBS with moderate refluc seen on upper GI study form January 2011 Dr. Watt Climes  . HTN (hypertension)    no meds  . Hypercholesterolemia   . Hyperlipidemia   . Hypothyroid   . Hypothyroidism   . IBS (irritable bowel syndrome)   . Impingement syndrome of right shoulder 01/05/2013  . Mass of left  side of neck   . Mood disorder ([place])   . MVP (mitral valve prolapse)    Hx of   . Neck pain   . Normal coronary arteries    by heart Cath  . Osteopenia   . Peripheral edema   . Right rotator cuff tear 01/05/2013  . Sciatic pain    recurrent  . Seasonal allergies   . Stroke ([place])   . Tension headache   .  Tremor   . Viral syndrome     Past Surgical History:  Procedure Laterality Date  . ANTERIOR CERVICAL DECOMP/DISCECTOMY FUSION  04/2015    Dr. Pamala Hurry, [address]  . CARDIAC CATHETERIZATION  2004   which revealed smooth and normal coronary arteries  . CARPAL TUNNEL RELEASE  2000   lt  . CATARACT EXTRACTION W/ INTRAOCULAR LENS  IMPLANT, BILATERAL  1/17   Dr. Lucita Ferrara  . COSMETIC SURGERY  2010   eyes-facial  . DENTAL SURGERY  1960  . LASIK    . LOOP RECORDER INSERTION N/A 09/22/2017   Procedure: LOOP RECORDER INSERTION;  Surgeon: Constance Haw, MD;  Location: [facility];  Service: Cardiovascular;  Laterality: N/A;  . SHOULDER ARTHROSCOPY WITH ROTATOR CUFF REPAIR Right 01/05/2013   Procedure: SHOULDER ARTHROSCOPY WITH ARTHROSCOPIC  ROTATOR CUFF REPAIR, ACROMIOPLASTY, EXTENSIVE DEBRIDEMENT;  Surgeon: Johnny Bridge, MD;  Location: [facility];  Service: Orthopedics;  Laterality: Right;  . TEE WITHOUT CARDIOVERSION N/A 09/22/2017   Procedure: TRANSESOPHAGEAL ECHOCARDIOGRAM (TEE);  Surgeon: Sanda Klein, MD;  Location: [facility];  Service: Cardiovascular;  Laterality: N/A;  . TONSILLECTOMY    . TUBAL LIGATION      Social History   Tobacco Use  Smoking Status Former Smoker  . Last attempt to quit: 01/05/1960  . Years since quitting: 58.4  Smokeless Tobacco Never Used  Tobacco Comment   Remote Hx    Social History   Substance and Sexual Activity  Alcohol Use Yes  . Alcohol/week: 7.0 standard drinks  . Types: 7 Cans of beer per week  . Frequency: Never   Comment: daily beer    Family History  Problem Relation Age of Onset  . Bladder  Cancer Father   . Mitral valve prolapse Mother   . Cancer Maternal Grandfather     Reviw of Systems:  Reviewed in the HPI.  All other systems are negative.  Physical Exam: Last menstrual period 09/28/1979.  GEN:  Well nourished, well developed in no acute distress HEENT: Normal NECK: No JVD; No carotid bruits LYMPHATICS: No lymphadenopathy CARDIAC: RRR , no murmurs, rubs, gallops RESPIRATORY:  Clear to auscultation without rales, wheezing or rhonchi  ABDOMEN: Soft, non-tender, non-distended MUSCULOSKELETAL:  No edema; No deformity  SKIN: Warm and dry NEUROLOGIC:  Alert and oriented x 3     ECG:   Assessment / Plan:   1.  Transient ischemic attack- September 20, 2017: Has some persistent dizziness and headache that presumably is due to the previous stroke.  2.  Essential hypertension:  Still eats some salty foods.   3-4 times a week she will eat chips. Her amlodipine may be contributing to her orthostatic hypotension symptoms . We will DC  the amlodipine and see how she does.  Advised her to decrease her salt intake.  3.  Hyperlipidemia:   Managed by Dr. Dagmar Hait.     4.  Hypothyroidism:  Managed by Dr. Dagmar Hait   5.  Chest pain :   Noncardiac.   Cath ~ 10 years ago and Coronary CT angiogram both show very minimal CAD .    [name], MD  05/25/2018 5:25 AM    [address] [place] [place],  [place], [place]  [address] Pager [phone] Phone: [phone]; Fax: [phone]

## 2018-05-31 DIAGNOSIS — Z1231 Encounter for screening mammogram for malignant neoplasm of breast: Secondary | ICD-10-CM | POA: Diagnosis not present

## 2018-06-06 ENCOUNTER — Other Ambulatory Visit: Payer: Self-pay | Admitting: Neurology

## 2018-06-12 ENCOUNTER — Ambulatory Visit (INDEPENDENT_AMBULATORY_CARE_PROVIDER_SITE_OTHER): Payer: Medicare Other | Admitting: *Deleted

## 2018-06-12 DIAGNOSIS — I639 Cerebral infarction, unspecified: Secondary | ICD-10-CM | POA: Diagnosis not present

## 2018-06-12 NOTE — Progress Notes (Signed)
Carelink Summary Report / Loop Recorder 

## 2018-06-13 ENCOUNTER — Encounter: Payer: Self-pay | Admitting: Obstetrics & Gynecology

## 2018-06-15 LAB — CUP PACEART REMOTE DEVICE CHECK
Date Time Interrogation Session: 20190813000951
Implantable Pulse Generator Implant Date: 20181227

## 2018-06-20 DIAGNOSIS — M25551 Pain in right hip: Secondary | ICD-10-CM | POA: Diagnosis not present

## 2018-06-25 LAB — CUP PACEART REMOTE DEVICE CHECK
Date Time Interrogation Session: 20190915003714
Implantable Pulse Generator Implant Date: 20181227

## 2018-06-28 ENCOUNTER — Other Ambulatory Visit: Payer: Self-pay | Admitting: Neurology

## 2018-06-28 DIAGNOSIS — L820 Inflamed seborrheic keratosis: Secondary | ICD-10-CM | POA: Diagnosis not present

## 2018-06-28 DIAGNOSIS — L821 Other seborrheic keratosis: Secondary | ICD-10-CM | POA: Diagnosis not present

## 2018-06-28 DIAGNOSIS — L814 Other melanin hyperpigmentation: Secondary | ICD-10-CM | POA: Diagnosis not present

## 2018-06-28 DIAGNOSIS — D225 Melanocytic nevi of trunk: Secondary | ICD-10-CM | POA: Diagnosis not present

## 2018-07-03 ENCOUNTER — Telehealth: Payer: Self-pay | Admitting: *Deleted

## 2018-07-03 ENCOUNTER — Ambulatory Visit: Payer: Medicare Other | Admitting: Neurology

## 2018-07-03 NOTE — Telephone Encounter (Signed)
Patient called to cancel follow up same day.  Reports her husband fell and he is going to have to be taken to the doctor.  She will call back to reschedule.

## 2018-07-05 ENCOUNTER — Encounter: Payer: Self-pay | Admitting: Obstetrics & Gynecology

## 2018-07-13 ENCOUNTER — Ambulatory Visit (INDEPENDENT_AMBULATORY_CARE_PROVIDER_SITE_OTHER): Payer: Medicare Other | Admitting: *Deleted

## 2018-07-13 DIAGNOSIS — I639 Cerebral infarction, unspecified: Secondary | ICD-10-CM | POA: Diagnosis not present

## 2018-07-14 NOTE — Progress Notes (Signed)
Carelink Summary Report / Loop Recorder 

## 2018-07-17 ENCOUNTER — Other Ambulatory Visit: Payer: Self-pay | Admitting: Neurology

## 2018-07-25 LAB — CUP PACEART REMOTE DEVICE CHECK
Date Time Interrogation Session: 20191018003754
Implantable Pulse Generator Implant Date: 20181227

## 2018-08-10 ENCOUNTER — Telehealth: Payer: Self-pay | Admitting: *Deleted

## 2018-08-10 NOTE — Telephone Encounter (Signed)
Spoke with patient regarding 3 second pause 08/09/18 at 9:02. Patient reports no symptoms and was most likely asleep at this time. Advised patient we will place episode in Dr. Curt Bears folder for review.

## 2018-08-14 DIAGNOSIS — H04122 Dry eye syndrome of left lacrimal gland: Secondary | ICD-10-CM | POA: Diagnosis not present

## 2018-08-14 DIAGNOSIS — H04202 Unspecified epiphora, left lacrimal gland: Secondary | ICD-10-CM | POA: Diagnosis not present

## 2018-08-15 ENCOUNTER — Ambulatory Visit (INDEPENDENT_AMBULATORY_CARE_PROVIDER_SITE_OTHER): Payer: Medicare Other

## 2018-08-15 DIAGNOSIS — I639 Cerebral infarction, unspecified: Secondary | ICD-10-CM | POA: Diagnosis not present

## 2018-08-16 ENCOUNTER — Other Ambulatory Visit: Payer: Self-pay | Admitting: Nurse Practitioner

## 2018-08-16 MED ORDER — ROSUVASTATIN CALCIUM 5 MG PO TABS: 5.0000 mg | ORAL_TABLET | Freq: Every day | ORAL | 3 refills | Status: DC

## 2018-08-17 NOTE — Progress Notes (Signed)
Carelink Summary Report / Loop Recorder 

## 2018-08-28 ENCOUNTER — Ambulatory Visit: Payer: Medicare Other | Admitting: Cardiovascular Disease

## 2018-08-28 DIAGNOSIS — E038 Other specified hypothyroidism: Secondary | ICD-10-CM | POA: Diagnosis not present

## 2018-08-28 DIAGNOSIS — E7849 Other hyperlipidemia: Secondary | ICD-10-CM | POA: Diagnosis not present

## 2018-08-28 DIAGNOSIS — M859 Disorder of bone density and structure, unspecified: Secondary | ICD-10-CM | POA: Diagnosis not present

## 2018-08-28 DIAGNOSIS — R82998 Other abnormal findings in urine: Secondary | ICD-10-CM | POA: Diagnosis not present

## 2018-08-28 DIAGNOSIS — I10 Essential (primary) hypertension: Secondary | ICD-10-CM | POA: Diagnosis not present

## 2018-08-28 LAB — TSH: TSH: 0.59 (ref <=5.90)

## 2018-08-31 DIAGNOSIS — Z1212 Encounter for screening for malignant neoplasm of rectum: Secondary | ICD-10-CM | POA: Diagnosis not present

## 2018-08-31 LAB — IFOBT (OCCULT BLOOD): IFOBT: NEGATIVE

## 2018-09-04 DIAGNOSIS — I10 Essential (primary) hypertension: Secondary | ICD-10-CM | POA: Diagnosis not present

## 2018-09-04 DIAGNOSIS — R413 Other amnesia: Secondary | ICD-10-CM | POA: Diagnosis not present

## 2018-09-04 DIAGNOSIS — K589 Irritable bowel syndrome without diarrhea: Secondary | ICD-10-CM | POA: Diagnosis not present

## 2018-09-04 DIAGNOSIS — Z0000 Encounter for general adult medical examination without abnormal findings: Secondary | ICD-10-CM | POA: Diagnosis not present

## 2018-09-04 DIAGNOSIS — Z6822 Body mass index (BMI) 22.0-22.9, adult: Secondary | ICD-10-CM | POA: Diagnosis not present

## 2018-09-04 DIAGNOSIS — I25119 Atherosclerotic heart disease of native coronary artery with unspecified angina pectoris: Secondary | ICD-10-CM | POA: Diagnosis not present

## 2018-09-04 DIAGNOSIS — E038 Other specified hypothyroidism: Secondary | ICD-10-CM | POA: Diagnosis not present

## 2018-09-04 DIAGNOSIS — K219 Gastro-esophageal reflux disease without esophagitis: Secondary | ICD-10-CM | POA: Diagnosis not present

## 2018-09-04 DIAGNOSIS — F39 Unspecified mood [affective] disorder: Secondary | ICD-10-CM | POA: Diagnosis not present

## 2018-09-04 DIAGNOSIS — I639 Cerebral infarction, unspecified: Secondary | ICD-10-CM | POA: Diagnosis not present

## 2018-09-04 DIAGNOSIS — E7849 Other hyperlipidemia: Secondary | ICD-10-CM | POA: Diagnosis not present

## 2018-09-04 DIAGNOSIS — M859 Disorder of bone density and structure, unspecified: Secondary | ICD-10-CM | POA: Diagnosis not present

## 2018-09-18 ENCOUNTER — Ambulatory Visit (INDEPENDENT_AMBULATORY_CARE_PROVIDER_SITE_OTHER): Payer: Medicare Other

## 2018-09-18 DIAGNOSIS — I639 Cerebral infarction, unspecified: Secondary | ICD-10-CM

## 2018-09-18 LAB — CUP PACEART REMOTE DEVICE CHECK
Date Time Interrogation Session: 20191223013634
Implantable Pulse Generator Implant Date: 20181227

## 2018-09-18 NOTE — Progress Notes (Signed)
Carelink Summary Report / Loop Recorder 

## 2018-10-08 LAB — CUP PACEART REMOTE DEVICE CHECK
Date Time Interrogation Session: 20191120010612
Implantable Pulse Generator Implant Date: 20181227

## 2018-10-20 ENCOUNTER — Ambulatory Visit (INDEPENDENT_AMBULATORY_CARE_PROVIDER_SITE_OTHER): Payer: Medicare Other

## 2018-10-20 DIAGNOSIS — I639 Cerebral infarction, unspecified: Secondary | ICD-10-CM

## 2018-10-22 ENCOUNTER — Other Ambulatory Visit: Payer: Self-pay | Admitting: Neurology

## 2018-10-22 LAB — CUP PACEART REMOTE DEVICE CHECK
Date Time Interrogation Session: 20200125013528
Implantable Pulse Generator Implant Date: 20181227

## 2018-10-23 NOTE — Progress Notes (Signed)
Carelink Summary Report / Loop Recorder 

## 2018-10-27 ENCOUNTER — Encounter: Payer: Self-pay | Admitting: Cardiovascular Disease

## 2018-10-27 ENCOUNTER — Ambulatory Visit (INDEPENDENT_AMBULATORY_CARE_PROVIDER_SITE_OTHER): Payer: Medicare Other | Admitting: Cardiovascular Disease

## 2018-10-27 ENCOUNTER — Encounter

## 2018-10-27 VITALS — BP 130/78 | HR 59 | Ht 62.0 in | Wt 118.4 lb

## 2018-10-27 DIAGNOSIS — I63412 Cerebral infarction due to embolism of left middle cerebral artery: Secondary | ICD-10-CM

## 2018-10-27 DIAGNOSIS — I10 Essential (primary) hypertension: Secondary | ICD-10-CM | POA: Diagnosis not present

## 2018-10-27 DIAGNOSIS — E782 Mixed hyperlipidemia: Secondary | ICD-10-CM

## 2018-10-27 NOTE — Patient Instructions (Signed)

## 2018-10-27 NOTE — Progress Notes (Signed)
Bianca Garcia Date of Birth  1941-07-16       [address]    [place]     [place],   [address]      Problem list: 1. History of chest pain-normal coronary artery heart catheterization Had coronary artery calcifications by CT scan   2. Hyperlipidemia 3. Hypertension 4. Hypothyroidism 5. CVA     Pt is doing well from a cardiac standpoint.  She continues to have anxiety.  She takes Klonipin on occasion which helps.  She has not had any cardiac problems.  Feb 20, 2016:  Bianca Garcia was seen by [place] of abdominal issues.   Got a CT scan  Was noted to incidentally found to have right coronary artery calcifications.   Does have some mild chest pressure like sensation with working outside, especially if on an incline.  Records from [place] were reviewed.   Chol = 247 Trigs = 89 HDL = 71 LDL 158.   Is having lots of dizziness with the Buspar.    Oct. 18, 2017: She is doing well Seems to be tolerating the amlodipine  No longer has this vague chest tightness. ( ? Esophageal spasm )   January 20, 2017:  Bianca Garcia is seen today for follow-up visit. BP is doing well.  Still has some CP ,  myoview was negative in 2017.   Has had a normal cath in the past.    Saw her primary MD - tried Sertraline . She is feeling better after stopping the Atorvastatin  Doing well on the amlodipine   Oct. 25, 2018 Continues to have frequent episodes of CP  Seems to be associated frequently with anxiety . Is having some CP right now Will last for a few minuets Constant ache  No worsened with exercise or walking   Was not able to tolerate the atrovastatin   January 19, 2018: [name] is seen today for follow-up of her hypertension, hyperlipidemia and chest pains.  She was admitted on December 25 with a stroke.  She received TPA with good functional recovery.  Transesophageal echocardiogram was unremarkable.  She has an implantable loop  recorder. Echocardiogram during that admission reveals normal left ventricular systolic function with EF of 55 to 60%.  She has grade 2 diastolic dysfunction.  She has mild pulmonary artery hypertension with estimated PA pressure of 36 mmHg.  She presented to the emergency room again on December 21, 2017 with some dizziness and left facial numbness.  Her symptoms resolved spontaneously.  MRI of the brain was negative for an acute infarction.  The patient was discharged in stable condition.  She continues to fall on occasion .    Falling episodes are much worse since the CVA .   Symptoms seem to be related to orthostatic hypotension  Chronic CP .   May 25, 2017:  Bianca Garcia seen today for follow-up visit.  She has a history of chest pain.  She is had heart catheterization in the past which did not reveal any significant coronary artery disease.  She had a stroke on September 20, 2017: She had TPA with resolution of her symptoms.  She was started on aspirin and Plavix.  Is in [place].   Is under lots of stress  Has constant chest pain , also has a headache , has had difficulty getting her words out since her stroke  Very fatigued .   Coronary CT angiogram performed in December, 2018 revealed very mild  plaque in the proximal LAD.  There is no significant stenosis.  Her calcium score is 74 Agatston units placing the patient in the 51st percentile for age and gender  Having lots of orthostatic symptoms , Still eats lots of salty foods ( salty foods )   October 27, 2018:  Bianca Garcia is seen today for follow-up visit.  She has a long history of hypertension.  She has a history of noncardiac chest pain.  We have performed a heart catheterization and a coronary CT angiogram both of which showed normal coronary arteries.  She has a history of hyperlipidemia and is on Crestor. Has had a history of a stroke and is on Plavix.  Seems to be doing well. Is still dizzy. No CP  Overall seems to be doing  well     Current Outpatient Medications  Medication Sig Dispense Refill  . Calcium Carbonate-Vitamin D (CALCIUM + D PO) Take 1 tablet by mouth daily.     . clonazePAM (KLONOPIN) 0.5 MG tablet Take 0.25 mg by mouth 2 (two) times daily as needed for anxiety.    . clopidogrel (PLAVIX) 75 MG tablet Take 1 tablet (75 mg total) by mouth daily. 30 tablet 1  . cycloSPORINE (RESTASIS) 0.05 % ophthalmic emulsion Apply to eye.    . levothyroxine (SYNTHROID, LEVOTHROID) 88 MCG tablet Take 88 mcg by mouth daily.    . Multiple Vitamins-Minerals (PRESERVISION AREDS PO) Take 1 tablet by mouth 2 (two) times daily.     . naproxen sodium (ALEVE) 220 MG tablet Take 220 mg by mouth as needed (chest pain).    . propranolol (INDERAL) 40 MG tablet Take 1 tablet (40 mg total) by mouth 2 (two) times daily. Please call [phone] to schedule an appt. 60 tablet 2  . Propylene Glycol (SYSTANE BALANCE OP) Apply 1 drop to eye daily. Reported on 02/27/2016    . rosuvastatin (CRESTOR) 5 MG tablet Take 1 tablet (5 mg total) by mouth daily. 90 tablet 3   No current facility-administered medications for this visit.    Family History  Problem Relation Age of Onset  . Bladder Cancer Father   . Mitral valve prolapse Mother   . Cancer Maternal Grandfather    Smoked briefly as a teenager.   Allergies  Allergen Reactions  . Fluticasone Other (See Comments)    DRY EYES  . Codeine Nausea Only  . Lipitor [Atorvastatin Calcium]     Intolerant   . Rosuvastatin     Intolerant   . Sulfa Antibiotics     vomiting    Past Medical History:  Diagnosis Date  . Anxiety   . Arthritis   . Biceps tendonitis 01/05/2013  . 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 CTS (carpal tunnel syndrome) 1995  . DJD (degenerative joint disease) 2016   DDD with spinal stenosis. Street of back injections by Dr. Brien Few 2016 with good relief, history of cervical DD with possiblity of surgery by Dr. Trenton Gammon.  Marland Kitchen GERD (gastroesophageal reflux disease)    IBS  with moderate refluc seen on upper GI study form January 2011 Dr. Watt Climes  . HTN (hypertension)    no meds  . Hyperlipidemia   . Hypothyroidism   . IBS (irritable bowel syndrome)   . Impingement syndrome of right shoulder 01/05/2013  . Mass of left side of neck   . Mood disorder ([place])   . MVP (mitral valve prolapse)    Hx of   . Osteopenia   . Peripheral edema   .  Right rotator cuff tear 01/05/2013  . Sciatic pain    recurrent  . Seasonal allergies   . Stroke ([place])   . Tension headache   . Tremor     Past Surgical History:  Procedure Laterality Date  . ANTERIOR CERVICAL DECOMP/DISCECTOMY FUSION  04/2015    Dr. Pamala Hurry, [place]  . CARDIAC CATHETERIZATION  2004   which revealed smooth and normal coronary arteries  . CARPAL TUNNEL RELEASE  2000   lt  . CATARACT EXTRACTION W/ INTRAOCULAR LENS  IMPLANT, BILATERAL  1/17   Dr. Lucita Ferrara  . COSMETIC SURGERY  2010   eyes-facial  . DENTAL SURGERY  1960  . LASIK    . LOOP RECORDER INSERTION N/A 09/22/2017   Procedure: LOOP RECORDER INSERTION;  Surgeon: Constance Haw, MD;  Location: [facility];  Service: Cardiovascular;  Laterality: N/A;  . SHOULDER ARTHROSCOPY WITH ROTATOR CUFF REPAIR Right 01/05/2013   Procedure: SHOULDER ARTHROSCOPY WITH ARTHROSCOPIC  ROTATOR CUFF REPAIR, ACROMIOPLASTY, EXTENSIVE DEBRIDEMENT;  Surgeon: Johnny Bridge, MD;  Location: [facility];  Service: Orthopedics;  Laterality: Right;  . TEE WITHOUT CARDIOVERSION N/A 09/22/2017   Procedure: TRANSESOPHAGEAL ECHOCARDIOGRAM (TEE);  Surgeon: Sanda Klein, MD;  Location: [facility];  Service: Cardiovascular;  Laterality: N/A;  . TONSILLECTOMY    . TUBAL LIGATION      Social History   Tobacco Use  Smoking Status Former Smoker  . Last attempt to quit: 01/05/1960  . Years since quitting: 58.8  Smokeless Tobacco Never Used  Tobacco Comment   Remote Hx    Social History   Substance and Sexual Activity  Alcohol Use Yes  .  Alcohol/week: 7.0 standard drinks  . Types: 7 Cans of beer per week  . Frequency: Never   Comment: daily beer    Family History  Problem Relation Age of Onset  . Bladder Cancer Father   . Mitral valve prolapse Mother   . Cancer Maternal Grandfather     Reviw of Systems:  Reviewed in the HPI.  All other systems are negative.   Physical Exam: Blood pressure 130/78, pulse (!) 59, height 5\' 2"  (1.575 m), weight 118 lb 6.4 oz (53.7 kg), last menstrual period 09/28/1979, SpO2 98 %.  GEN:  Well nourished, well developed in no acute distress HEENT: Normal NECK: No JVD; No carotid bruits LYMPHATICS: No lymphadenopathy CARDIAC: RRR , no murmurs, rubs, gallops RESPIRATORY:  Clear to auscultation without rales, wheezing or rhonchi  ABDOMEN: Soft, non-tender, non-distended MUSCULOSKELETAL:  No edema; No deformity  SKIN: Warm and dry NEUROLOGIC:  Alert and oriented x 3    ECG:   Assessment / Plan:   1.  Transient ischemic attack- continue Plavix.  2.  Essential hypertension:   Seems to be well controlled.  Continue current medications.  3.  Hyperlipidemia:     Crestor.  Her recent labs from November, 2019 look great.  4.  Hypothyroidism:    Followed by Dr.Avva   5.  Chest pain :   Noncardiac.   Cath ~ 10 years ago and Coronary CT angiogram both show very minimal CAD .   Again in 1 year.  [name], MD  10/27/2018 9:46 AM    [address] [place],  [place], [place]  [address] Pager [phone] Phone: [phone]; Fax: [phone]

## 2018-11-22 ENCOUNTER — Ambulatory Visit (INDEPENDENT_AMBULATORY_CARE_PROVIDER_SITE_OTHER): Payer: Medicare Other | Admitting: *Deleted

## 2018-11-22 DIAGNOSIS — I639 Cerebral infarction, unspecified: Secondary | ICD-10-CM | POA: Diagnosis not present

## 2018-11-23 LAB — CUP PACEART REMOTE DEVICE CHECK
Date Time Interrogation Session: 20200227013848
Implantable Pulse Generator Implant Date: 20181227

## 2018-11-29 NOTE — Progress Notes (Signed)
Carelink Summary Report / Loop Recorder 

## 2018-12-09 ENCOUNTER — Other Ambulatory Visit: Payer: Self-pay | Admitting: Neurology

## 2018-12-12 ENCOUNTER — Other Ambulatory Visit: Payer: Self-pay | Admitting: Cardiology

## 2018-12-16 ENCOUNTER — Other Ambulatory Visit: Payer: Self-pay | Admitting: Neurology

## 2018-12-25 ENCOUNTER — Ambulatory Visit (INDEPENDENT_AMBULATORY_CARE_PROVIDER_SITE_OTHER): Payer: Medicare Other | Admitting: *Deleted

## 2018-12-25 ENCOUNTER — Other Ambulatory Visit: Payer: Self-pay

## 2018-12-25 DIAGNOSIS — I639 Cerebral infarction, unspecified: Secondary | ICD-10-CM | POA: Diagnosis not present

## 2018-12-26 LAB — CUP PACEART REMOTE DEVICE CHECK
Date Time Interrogation Session: 20200331020607
Implantable Pulse Generator Implant Date: 20181227

## 2019-01-02 NOTE — Progress Notes (Signed)
Carelink Summary Report / Loop Recorder 

## 2019-01-14 IMAGING — CT CT HEAD W/O CM
4 series · 17 of 47 positions shown, 19 images · non-contrast
Comparison: 09/20/2017

CLINICAL DATA: Followup stroke. Acute presentation yesterday with
right-sided weakness.

EXAM:
CT HEAD WITHOUT CONTRAST
TECHNIQUE: Contiguous axial images were obtained from the base of the skull
through the vertex without intravenous contrast.

[Series 3: head without · axial · non-contrast · 0.41mm/px · z∈[-100,+15]mm · 7 of 31 slices shown, 9 images]
[im 4/31  brain]
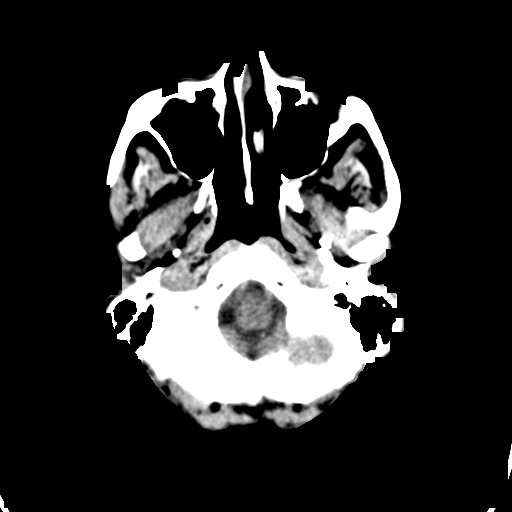
[im 4/31  bone]
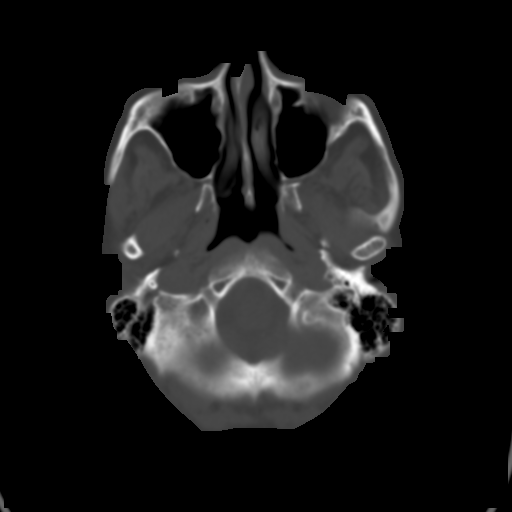
[im 8/31  brain]
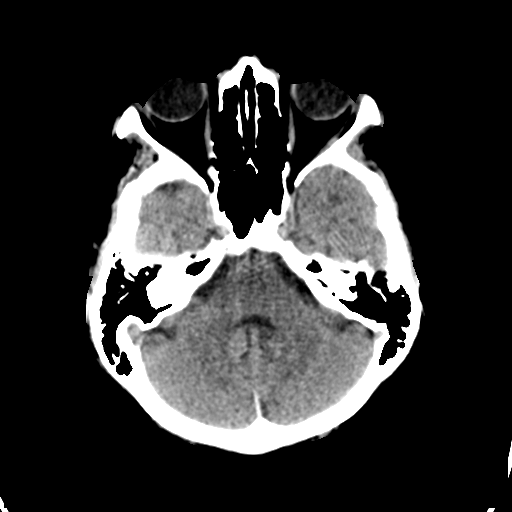
[im 12/31  brain]
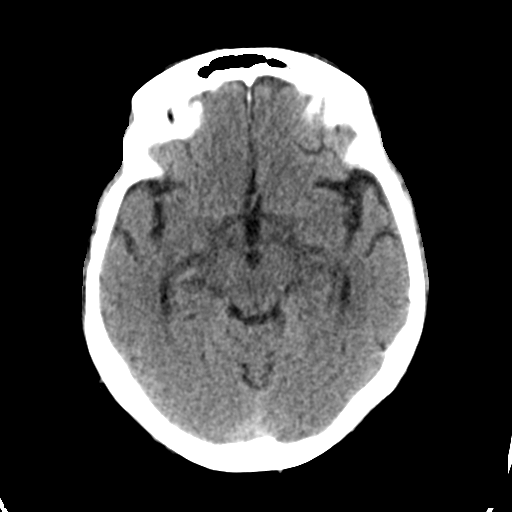
[im 16/31  brain]
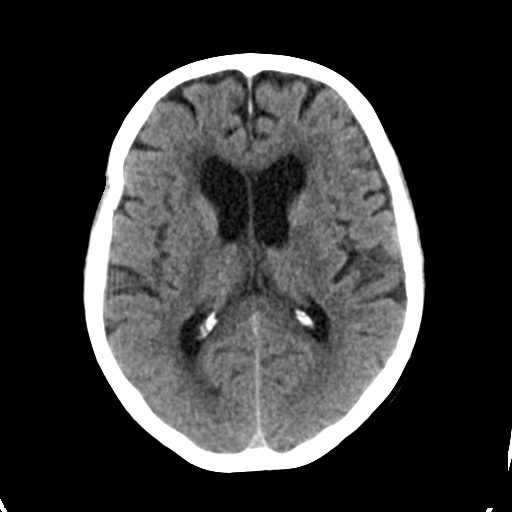
[im 19/31  brain]
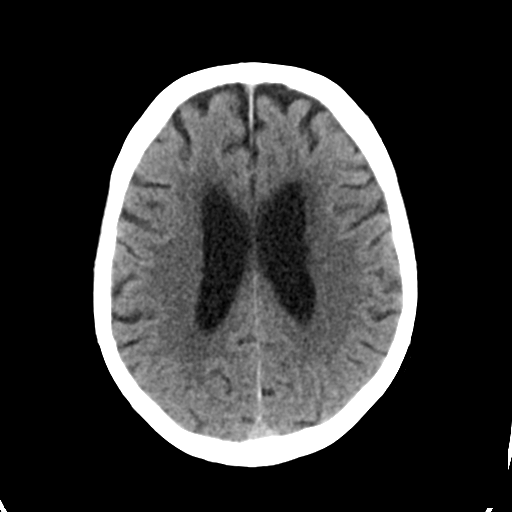
[im 19/31  bone]
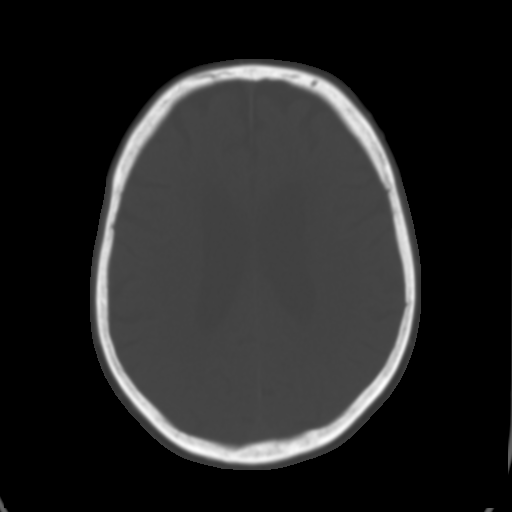
[im 23/31  brain]
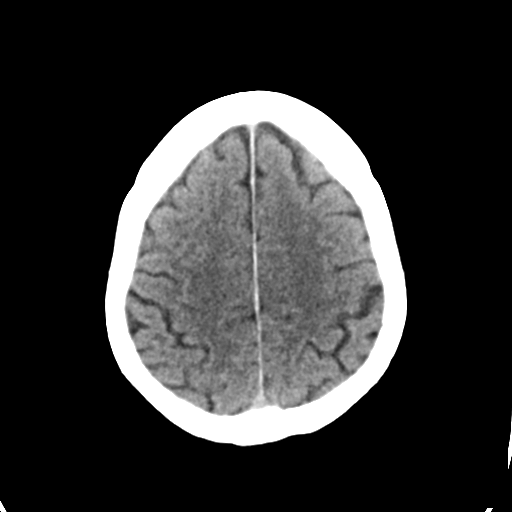
[im 27/31  brain]
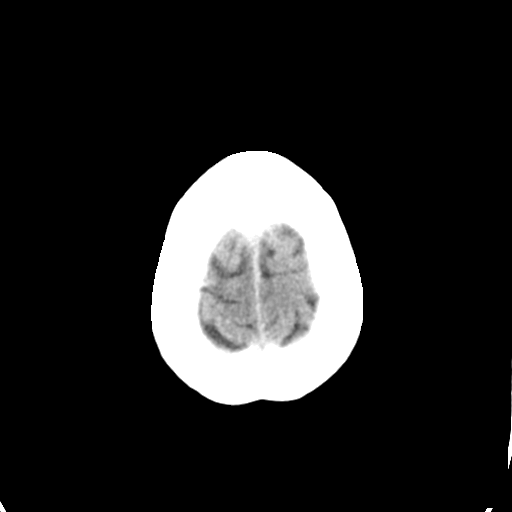

[Series 4: head bone · axial · 0.41mm/px · z∈[-101,-49]mm · 4 of 76 slices shown]
[im 8/76  bone]
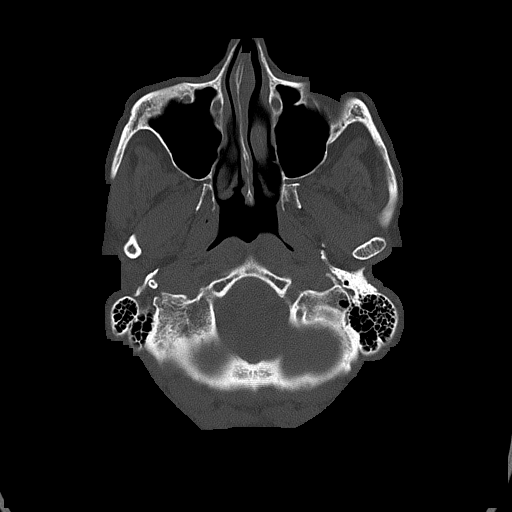
[im 16/76  bone]
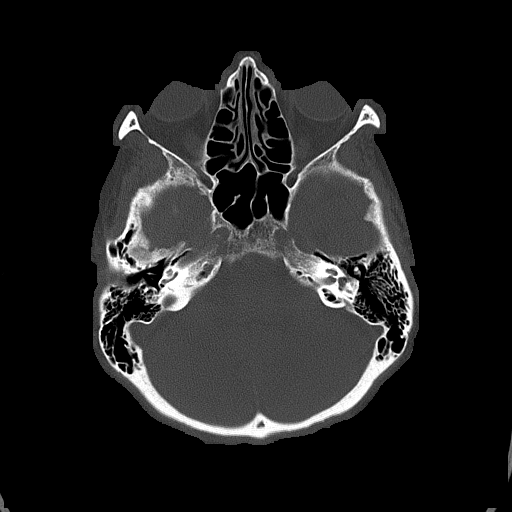
[im 23/76  bone]
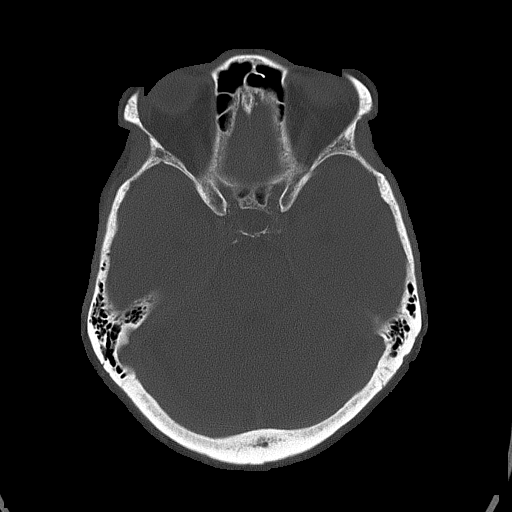
[im 34/76  bone]
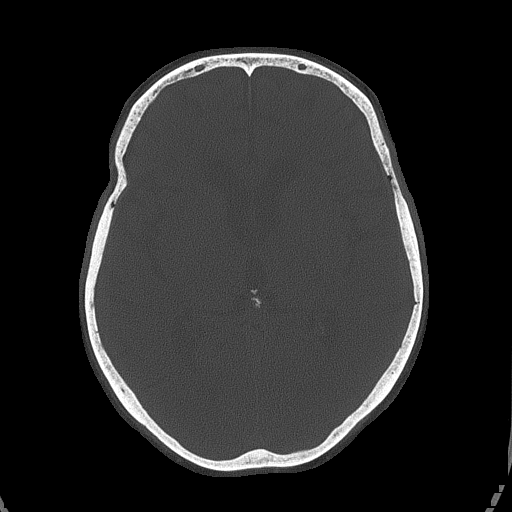

[Series 5: head without cor · coronal · non-contrast · 0.31mm/px · 3 of 67 slices shown]
[im 23/67  brain]
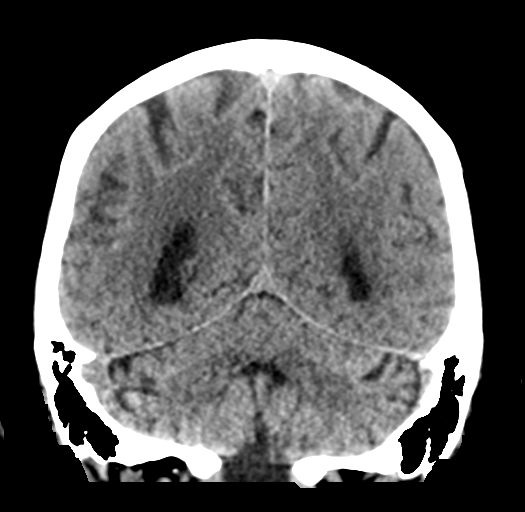
[im 30/67  brain]
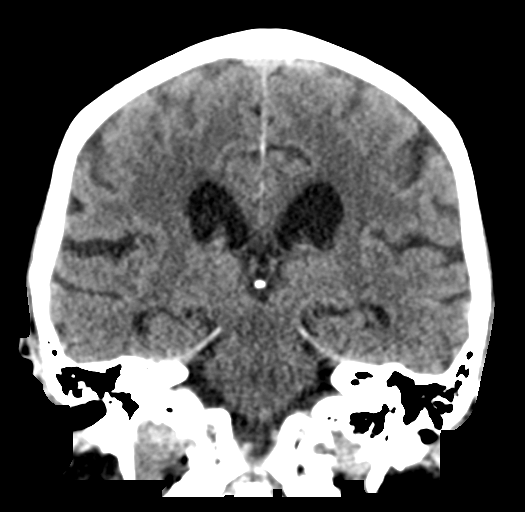
[im 37/67  brain]
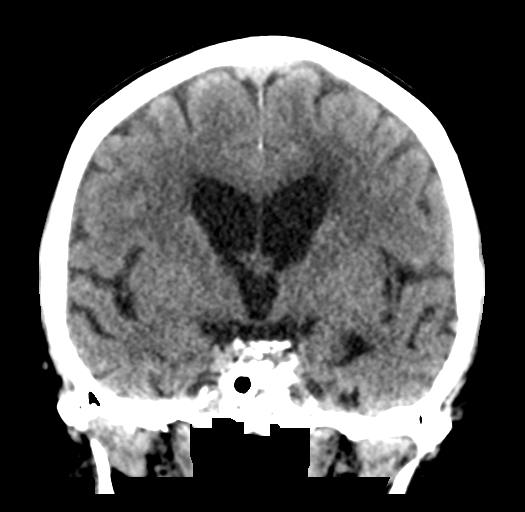

[Series 6: head without sag · sagittal · non-contrast · 0.32mm/px · 3 of 53 slices shown]
[im 18/53  brain]
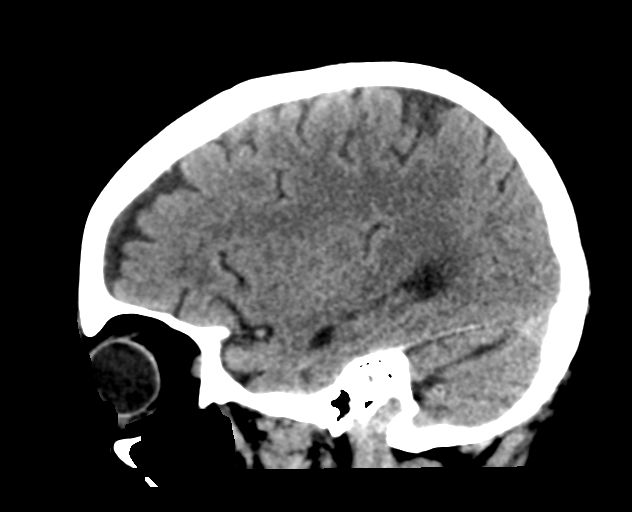
[im 27/53  brain]
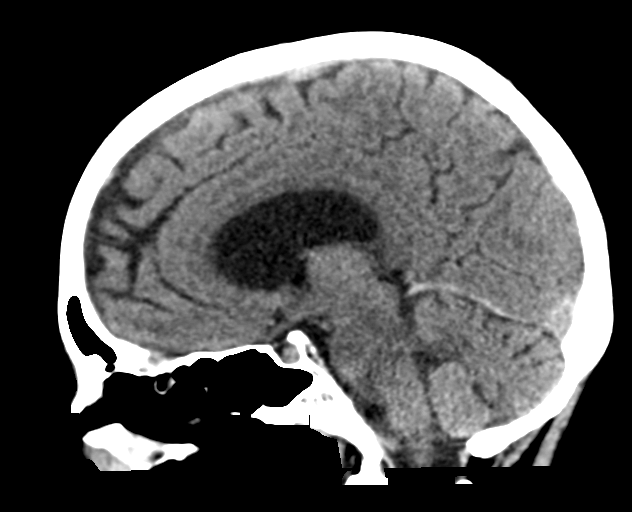
[im 35/53  brain]
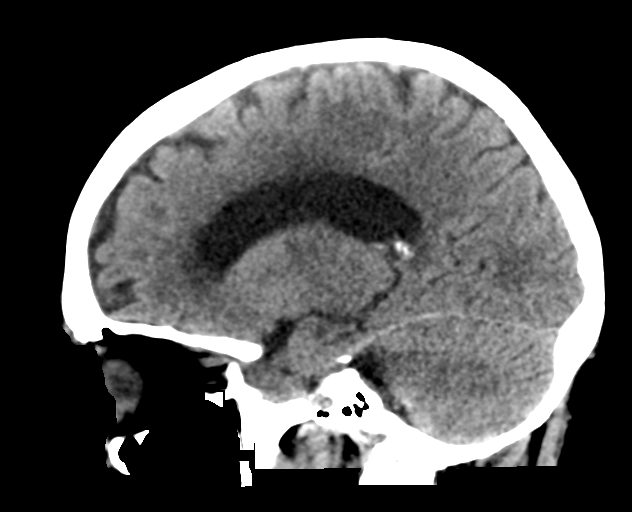

[17 of 47 positions shown; findings below may reference images not displayed]

FINDINGS: Brain: No change since yesterday. No sign of acute infarction, mass
lesion, hemorrhage, hydrocephalus or extra-axial collection. Mild
chronic small-vessel ischemic changes of the deep white matter.

Vascular: There is atherosclerotic calcification of the major
vessels at the base of the brain.

Skull: Normal

Sinuses/Orbits: Clear/normal

Other: None
IMPRESSION: No change. No acute finding. Mild chronic small-vessel ischemic
change of the hemispheric white matter.

## 2019-02-05 ENCOUNTER — Ambulatory Visit (INDEPENDENT_AMBULATORY_CARE_PROVIDER_SITE_OTHER): Payer: Medicare Other | Admitting: *Deleted

## 2019-02-05 ENCOUNTER — Other Ambulatory Visit: Payer: Self-pay

## 2019-02-05 DIAGNOSIS — I639 Cerebral infarction, unspecified: Secondary | ICD-10-CM | POA: Diagnosis not present

## 2019-02-05 LAB — CUP PACEART REMOTE DEVICE CHECK
Date Time Interrogation Session: 20200511121102
Implantable Pulse Generator Implant Date: 20181227

## 2019-02-14 NOTE — Progress Notes (Signed)
Carelink Summary Report / Loop Recorder 

## 2019-03-11 ENCOUNTER — Other Ambulatory Visit: Payer: Self-pay | Admitting: Adult Health

## 2019-03-11 LAB — CUP PACEART REMOTE DEVICE CHECK
Date Time Interrogation Session: 20200613123605
Implantable Pulse Generator Implant Date: 20181227

## 2019-03-12 ENCOUNTER — Ambulatory Visit (INDEPENDENT_AMBULATORY_CARE_PROVIDER_SITE_OTHER): Payer: Medicare Other | Admitting: *Deleted

## 2019-03-12 DIAGNOSIS — I639 Cerebral infarction, unspecified: Secondary | ICD-10-CM | POA: Diagnosis not present

## 2019-03-15 ENCOUNTER — Telehealth: Payer: Self-pay

## 2019-03-15 NOTE — Telephone Encounter (Signed)
IF patient calls back she should be schedule with Judson Roch NP or Dr.Yan. IF pt needs refills on propranolol please forward to Dr. Krista Blue.

## 2019-03-20 DIAGNOSIS — F419 Anxiety disorder, unspecified: Secondary | ICD-10-CM | POA: Diagnosis not present

## 2019-03-20 DIAGNOSIS — R413 Other amnesia: Secondary | ICD-10-CM | POA: Diagnosis not present

## 2019-03-20 DIAGNOSIS — K589 Irritable bowel syndrome without diarrhea: Secondary | ICD-10-CM | POA: Diagnosis not present

## 2019-03-20 DIAGNOSIS — E785 Hyperlipidemia, unspecified: Secondary | ICD-10-CM | POA: Diagnosis not present

## 2019-03-20 DIAGNOSIS — I639 Cerebral infarction, unspecified: Secondary | ICD-10-CM | POA: Diagnosis not present

## 2019-03-20 DIAGNOSIS — I10 Essential (primary) hypertension: Secondary | ICD-10-CM | POA: Diagnosis not present

## 2019-03-20 DIAGNOSIS — M1990 Unspecified osteoarthritis, unspecified site: Secondary | ICD-10-CM | POA: Diagnosis not present

## 2019-03-20 DIAGNOSIS — E039 Hypothyroidism, unspecified: Secondary | ICD-10-CM | POA: Diagnosis not present

## 2019-03-20 DIAGNOSIS — Z1331 Encounter for screening for depression: Secondary | ICD-10-CM | POA: Diagnosis not present

## 2019-03-20 DIAGNOSIS — I25119 Atherosclerotic heart disease of native coronary artery with unspecified angina pectoris: Secondary | ICD-10-CM | POA: Diagnosis not present

## 2019-03-20 NOTE — Progress Notes (Signed)
Carelink Summary Report / Loop Recorder 

## 2019-04-04 ENCOUNTER — Other Ambulatory Visit: Payer: Self-pay | Admitting: Adult Health

## 2019-04-11 ENCOUNTER — Other Ambulatory Visit: Payer: Self-pay | Admitting: Adult Health

## 2019-04-12 ENCOUNTER — Ambulatory Visit (INDEPENDENT_AMBULATORY_CARE_PROVIDER_SITE_OTHER): Payer: Medicare Other | Admitting: *Deleted

## 2019-04-12 DIAGNOSIS — I639 Cerebral infarction, unspecified: Secondary | ICD-10-CM | POA: Diagnosis not present

## 2019-04-12 LAB — CUP PACEART REMOTE DEVICE CHECK
Date Time Interrogation Session: 20200716161200
Implantable Pulse Generator Implant Date: 20181227

## 2019-04-19 NOTE — Progress Notes (Signed)
Carelink Summary Report / Loop Recorder 

## 2019-04-22 ENCOUNTER — Other Ambulatory Visit: Payer: Self-pay | Admitting: Adult Health

## 2019-04-23 ENCOUNTER — Telehealth: Payer: Self-pay | Admitting: *Deleted

## 2019-04-23 NOTE — Telephone Encounter (Signed)
Received request for propranolol refill.  Pt needs appt with SS/NP or Dr. Krista Blue prior to fill. Will send mychart message since phone down at this time.

## 2019-05-15 ENCOUNTER — Ambulatory Visit (INDEPENDENT_AMBULATORY_CARE_PROVIDER_SITE_OTHER): Payer: Medicare Other | Admitting: *Deleted

## 2019-05-15 DIAGNOSIS — I639 Cerebral infarction, unspecified: Secondary | ICD-10-CM

## 2019-05-15 LAB — CUP PACEART REMOTE DEVICE CHECK
Date Time Interrogation Session: 20200818164025
Implantable Pulse Generator Implant Date: 20181227

## 2019-05-23 NOTE — Progress Notes (Signed)
Carelink Summary Report / Loop Recorder 

## 2019-06-18 ENCOUNTER — Ambulatory Visit (INDEPENDENT_AMBULATORY_CARE_PROVIDER_SITE_OTHER): Payer: Medicare Other | Admitting: *Deleted

## 2019-06-18 DIAGNOSIS — I639 Cerebral infarction, unspecified: Secondary | ICD-10-CM | POA: Diagnosis not present

## 2019-06-18 LAB — CUP PACEART REMOTE DEVICE CHECK
Date Time Interrogation Session: 20200920173607
Implantable Pulse Generator Implant Date: 20181227

## 2019-06-25 NOTE — Progress Notes (Signed)
Carelink Summary Report / Loop Recorder 

## 2019-07-03 DIAGNOSIS — Z23 Encounter for immunization: Secondary | ICD-10-CM | POA: Diagnosis not present

## 2019-07-18 DIAGNOSIS — M4180 Other forms of scoliosis, site unspecified: Secondary | ICD-10-CM | POA: Diagnosis not present

## 2019-07-18 DIAGNOSIS — M5136 Other intervertebral disc degeneration, lumbar region: Secondary | ICD-10-CM | POA: Diagnosis not present

## 2019-07-18 DIAGNOSIS — M545 Low back pain: Secondary | ICD-10-CM | POA: Diagnosis not present

## 2019-07-18 DIAGNOSIS — M4310 Spondylolisthesis, site unspecified: Secondary | ICD-10-CM | POA: Diagnosis not present

## 2019-07-20 ENCOUNTER — Ambulatory Visit (INDEPENDENT_AMBULATORY_CARE_PROVIDER_SITE_OTHER): Payer: Medicare Other | Admitting: *Deleted

## 2019-07-20 DIAGNOSIS — I63412 Cerebral infarction due to embolism of left middle cerebral artery: Secondary | ICD-10-CM

## 2019-07-22 LAB — CUP PACEART REMOTE DEVICE CHECK
Date Time Interrogation Session: 20201023180338
Implantable Pulse Generator Implant Date: 20181227

## 2019-07-25 DIAGNOSIS — Z1231 Encounter for screening mammogram for malignant neoplasm of breast: Secondary | ICD-10-CM | POA: Diagnosis not present

## 2019-07-27 NOTE — Progress Notes (Signed)
Carelink Summary Report / Loop Recorder 

## 2019-08-22 ENCOUNTER — Ambulatory Visit (INDEPENDENT_AMBULATORY_CARE_PROVIDER_SITE_OTHER): Payer: Medicare Other | Admitting: *Deleted

## 2019-08-22 DIAGNOSIS — I63412 Cerebral infarction due to embolism of left middle cerebral artery: Secondary | ICD-10-CM

## 2019-08-22 LAB — CUP PACEART REMOTE DEVICE CHECK
Date Time Interrogation Session: 20201125130016
Implantable Pulse Generator Implant Date: 20181227

## 2019-08-28 ENCOUNTER — Other Ambulatory Visit: Payer: Self-pay

## 2019-08-31 ENCOUNTER — Ambulatory Visit: Payer: Medicare Other | Admitting: Obstetrics & Gynecology

## 2019-08-31 ENCOUNTER — Encounter: Payer: Self-pay | Admitting: Obstetrics & Gynecology

## 2019-08-31 NOTE — Progress Notes (Deleted)
78 y.o. G2P2002 Married White or Caucasian female here for annual exam.    Patient's last menstrual period was 09/28/1979.          Sexually active: {yes no:314532}  The current method of family planning is {contraception:315051}.    Exercising: {yes no:314532}  {types:19826} Smoker:  {YES V2345720  Health Maintenance: Pap:  02/27/16 Neg              04/30/14 Neg  History of abnormal Pap:  no MMG: 07/25/2019  BIRADS 2 :neg. Colonoscopy:  2008 f/u 5 years.    BMD:   2011 Osteopenia  TDaP:  PCP Pneumonia vaccine(s): *** Shingrix: No Hep C testing: n/a Screening Labs: PCP    reports that she quit smoking about 59 years ago. She has never used smokeless tobacco. She reports current alcohol use of about 7.0 standard drinks of alcohol per week. She reports that she does not use drugs.  Past Medical History:  Diagnosis Date  . Anxiety   . Arthritis   . Biceps tendonitis 01/05/2013  . 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 CTS (carpal tunnel syndrome) 1995  . DJD (degenerative joint disease) 2016   DDD with spinal stenosis. Street of back injections by Dr. Brien Few 2016 with good relief, history of cervical DD with possiblity of surgery by Dr. Trenton Gammon.  Marland Kitchen GERD (gastroesophageal reflux disease)    IBS with moderate refluc seen on upper GI study form January 2011 Dr. Watt Climes  . HTN (hypertension)    no meds  . Hyperlipidemia   . Hypothyroidism   . IBS (irritable bowel syndrome)   . Impingement syndrome of right shoulder 01/05/2013  . Mass of left side of neck   . Mood disorder ([place])   . MVP (mitral valve prolapse)    Hx of   . Osteopenia   . Peripheral edema   . Right rotator cuff tear 01/05/2013  . Sciatic pain    recurrent  . Seasonal allergies   . Stroke ([place])   . Tension headache   . Tremor     Past Surgical History:  Procedure Laterality Date  . ANTERIOR CERVICAL DECOMP/DISCECTOMY FUSION  04/2015    Dr. Pamala Hurry, [place]  . CARDIAC CATHETERIZATION  2004   which revealed smooth and  normal coronary arteries  . CARPAL TUNNEL RELEASE  2000   lt  . CATARACT EXTRACTION W/ INTRAOCULAR LENS  IMPLANT, BILATERAL  1/17   Dr. Lucita Ferrara  . COSMETIC SURGERY  2010   eyes-facial  . DENTAL SURGERY  1960  . LASIK    . LOOP RECORDER INSERTION N/A 09/22/2017   Procedure: LOOP RECORDER INSERTION;  Surgeon: Constance Haw, MD;  Location: [facility];  Service: Cardiovascular;  Laterality: N/A;  . SHOULDER ARTHROSCOPY WITH ROTATOR CUFF REPAIR Right 01/05/2013   Procedure: SHOULDER ARTHROSCOPY WITH ARTHROSCOPIC  ROTATOR CUFF REPAIR, ACROMIOPLASTY, EXTENSIVE DEBRIDEMENT;  Surgeon: Johnny Bridge, MD;  Location: [facility];  Service: Orthopedics;  Laterality: Right;  . TEE WITHOUT CARDIOVERSION N/A 09/22/2017   Procedure: TRANSESOPHAGEAL ECHOCARDIOGRAM (TEE);  Surgeon: Sanda Klein, MD;  Location: [facility];  Service: Cardiovascular;  Laterality: N/A;  . TONSILLECTOMY    . TUBAL LIGATION      Current Outpatient Medications  Medication Sig Dispense Refill  . Calcium Carbonate-Vitamin D (CALCIUM + D PO) Take 1 tablet by mouth daily.     . clonazePAM (KLONOPIN) 0.5 MG tablet Take 0.25 mg by mouth 2 (two) times daily as needed for anxiety.    Marland Kitchen  clopidogrel (PLAVIX) 75 MG tablet Take 1 tablet (75 mg total) by mouth daily. 30 tablet 1  . cycloSPORINE (RESTASIS) 0.05 % ophthalmic emulsion Apply to eye.    . levothyroxine (SYNTHROID, LEVOTHROID) 88 MCG tablet Take 88 mcg by mouth daily.    . Multiple Vitamins-Minerals (PRESERVISION AREDS PO) Take 1 tablet by mouth 2 (two) times daily.     . naproxen sodium (ALEVE) 220 MG tablet Take 220 mg by mouth as needed (chest pain).    . propranolol (INDERAL) 40 MG tablet TAKE 1 TABLET (40 MG TOTAL) BY MOUTH 2 (TWO) TIMES DAILY. PLEASE CALL [phone] TO SCHEDULE AN APPT. 180 tablet 0  . Propylene Glycol (SYSTANE BALANCE OP) Apply 1 drop to eye daily. Reported on 02/27/2016    . rosuvastatin (CRESTOR) 5 MG tablet Take 1  tablet (5 mg total) by mouth daily. 90 tablet 3   No current facility-administered medications for this visit.     Family History  Problem Relation Age of Onset  . Bladder Cancer Father   . Mitral valve prolapse Mother   . Cancer Maternal Grandfather     Review of Systems  Exam:   LMP 09/28/1979   Height:      Ht Readings from Last 3 Encounters:  10/27/18 5\' 2"  (1.575 m)  05/25/18 5' 0.6" (1.539 m)  05/04/18 5' 0.5" (1.537 m)    General appearance: alert, cooperative and appears stated age Head: Normocephalic, without obvious abnormality, atraumatic Neck: no adenopathy, supple, symmetrical, trachea midline and thyroid {EXAM; THYROID:18604} Lungs: clear to auscultation bilaterally Breasts: {Exam; breast:13139::"normal appearance, no masses or tenderness"} Heart: regular rate and rhythm Abdomen: soft, non-tender; bowel sounds normal; no masses,  no organomegaly Extremities: extremities normal, atraumatic, no cyanosis or edema Skin: Skin color, texture, turgor normal. No rashes or lesions Lymph nodes: Cervical, supraclavicular, and axillary nodes normal. No abnormal inguinal nodes palpated Neurologic: Grossly normal   Pelvic: External genitalia:  no lesions              Urethra:  normal appearing urethra with no masses, tenderness or lesions              Bartholins and Skenes: normal                 Vagina: normal appearing vagina with normal color and discharge, no lesions              Cervix: {exam; cervix:14595}              Pap taken: {yes no:314532} Bimanual Exam:  Uterus:  {exam; uterus:12215}              Adnexa: {exam; adnexa:12223}               Rectovaginal: Confirms               Anus:  normal sphincter tone, no lesions  Chaperone was present for exam.  A:  Well Woman with normal exam  P:   {plan; gyn:5269::"mammogram","pap smear","return annually or prn"}

## 2019-09-14 ENCOUNTER — Other Ambulatory Visit: Payer: Self-pay | Admitting: Cardiovascular Disease

## 2019-09-18 NOTE — Progress Notes (Signed)
ILR remote 

## 2019-09-24 ENCOUNTER — Ambulatory Visit (INDEPENDENT_AMBULATORY_CARE_PROVIDER_SITE_OTHER): Payer: Medicare Other | Admitting: *Deleted

## 2019-09-24 DIAGNOSIS — I63412 Cerebral infarction due to embolism of left middle cerebral artery: Secondary | ICD-10-CM | POA: Diagnosis not present

## 2019-09-24 LAB — CUP PACEART REMOTE DEVICE CHECK
Date Time Interrogation Session: 20201228132630
Implantable Pulse Generator Implant Date: 20181227

## 2019-09-25 DIAGNOSIS — I10 Essential (primary) hypertension: Secondary | ICD-10-CM | POA: Diagnosis not present

## 2019-09-25 DIAGNOSIS — Z1212 Encounter for screening for malignant neoplasm of rectum: Secondary | ICD-10-CM | POA: Diagnosis not present

## 2019-10-02 DIAGNOSIS — R413 Other amnesia: Secondary | ICD-10-CM | POA: Diagnosis not present

## 2019-10-02 DIAGNOSIS — E039 Hypothyroidism, unspecified: Secondary | ICD-10-CM | POA: Diagnosis not present

## 2019-10-02 DIAGNOSIS — F419 Anxiety disorder, unspecified: Secondary | ICD-10-CM | POA: Diagnosis not present

## 2019-10-02 DIAGNOSIS — E785 Hyperlipidemia, unspecified: Secondary | ICD-10-CM | POA: Diagnosis not present

## 2019-10-02 DIAGNOSIS — I25119 Atherosclerotic heart disease of native coronary artery with unspecified angina pectoris: Secondary | ICD-10-CM | POA: Diagnosis not present

## 2019-10-02 DIAGNOSIS — M8580 Other specified disorders of bone density and structure, unspecified site: Secondary | ICD-10-CM | POA: Diagnosis not present

## 2019-10-02 DIAGNOSIS — K219 Gastro-esophageal reflux disease without esophagitis: Secondary | ICD-10-CM | POA: Diagnosis not present

## 2019-10-02 DIAGNOSIS — K589 Irritable bowel syndrome without diarrhea: Secondary | ICD-10-CM | POA: Diagnosis not present

## 2019-10-02 DIAGNOSIS — Z0000 Encounter for general adult medical examination without abnormal findings: Secondary | ICD-10-CM | POA: Diagnosis not present

## 2019-10-02 DIAGNOSIS — I639 Cerebral infarction, unspecified: Secondary | ICD-10-CM | POA: Diagnosis not present

## 2019-10-02 DIAGNOSIS — Z1331 Encounter for screening for depression: Secondary | ICD-10-CM | POA: Diagnosis not present

## 2019-10-02 DIAGNOSIS — I10 Essential (primary) hypertension: Secondary | ICD-10-CM | POA: Diagnosis not present

## 2019-10-11 DIAGNOSIS — Z23 Encounter for immunization: Secondary | ICD-10-CM | POA: Diagnosis not present

## 2019-10-15 DIAGNOSIS — I639 Cerebral infarction, unspecified: Secondary | ICD-10-CM | POA: Diagnosis not present

## 2019-10-15 DIAGNOSIS — F0150 Vascular dementia without behavioral disturbance: Secondary | ICD-10-CM | POA: Diagnosis not present

## 2019-10-15 DIAGNOSIS — R41841 Cognitive communication deficit: Secondary | ICD-10-CM | POA: Diagnosis not present

## 2019-10-16 DIAGNOSIS — I639 Cerebral infarction, unspecified: Secondary | ICD-10-CM | POA: Diagnosis not present

## 2019-10-16 DIAGNOSIS — F0150 Vascular dementia without behavioral disturbance: Secondary | ICD-10-CM | POA: Diagnosis not present

## 2019-10-16 DIAGNOSIS — R41841 Cognitive communication deficit: Secondary | ICD-10-CM | POA: Diagnosis not present

## 2019-10-22 DIAGNOSIS — F0150 Vascular dementia without behavioral disturbance: Secondary | ICD-10-CM | POA: Diagnosis not present

## 2019-10-22 DIAGNOSIS — I639 Cerebral infarction, unspecified: Secondary | ICD-10-CM | POA: Diagnosis not present

## 2019-10-22 DIAGNOSIS — R41841 Cognitive communication deficit: Secondary | ICD-10-CM | POA: Diagnosis not present

## 2019-10-25 ENCOUNTER — Ambulatory Visit (INDEPENDENT_AMBULATORY_CARE_PROVIDER_SITE_OTHER): Payer: Medicare Other | Admitting: *Deleted

## 2019-10-25 DIAGNOSIS — I639 Cerebral infarction, unspecified: Secondary | ICD-10-CM | POA: Diagnosis not present

## 2019-10-25 DIAGNOSIS — I63412 Cerebral infarction due to embolism of left middle cerebral artery: Secondary | ICD-10-CM

## 2019-10-25 DIAGNOSIS — F0150 Vascular dementia without behavioral disturbance: Secondary | ICD-10-CM | POA: Diagnosis not present

## 2019-10-25 DIAGNOSIS — R41841 Cognitive communication deficit: Secondary | ICD-10-CM | POA: Diagnosis not present

## 2019-10-25 LAB — CUP PACEART REMOTE DEVICE CHECK
Date Time Interrogation Session: 20210128134205
Implantable Pulse Generator Implant Date: 20181227

## 2019-10-26 NOTE — Progress Notes (Signed)
ILR Remote 

## 2019-10-29 DIAGNOSIS — F0150 Vascular dementia without behavioral disturbance: Secondary | ICD-10-CM | POA: Diagnosis not present

## 2019-10-29 DIAGNOSIS — R41841 Cognitive communication deficit: Secondary | ICD-10-CM | POA: Diagnosis not present

## 2019-10-29 DIAGNOSIS — I639 Cerebral infarction, unspecified: Secondary | ICD-10-CM | POA: Diagnosis not present

## 2019-11-01 DIAGNOSIS — F0150 Vascular dementia without behavioral disturbance: Secondary | ICD-10-CM | POA: Diagnosis not present

## 2019-11-01 DIAGNOSIS — R41841 Cognitive communication deficit: Secondary | ICD-10-CM | POA: Diagnosis not present

## 2019-11-01 DIAGNOSIS — I639 Cerebral infarction, unspecified: Secondary | ICD-10-CM | POA: Diagnosis not present

## 2019-11-06 DIAGNOSIS — Z23 Encounter for immunization: Secondary | ICD-10-CM | POA: Diagnosis not present

## 2019-11-12 DIAGNOSIS — I639 Cerebral infarction, unspecified: Secondary | ICD-10-CM | POA: Diagnosis not present

## 2019-11-12 DIAGNOSIS — R41841 Cognitive communication deficit: Secondary | ICD-10-CM | POA: Diagnosis not present

## 2019-11-12 DIAGNOSIS — F0150 Vascular dementia without behavioral disturbance: Secondary | ICD-10-CM | POA: Diagnosis not present

## 2019-11-26 ENCOUNTER — Ambulatory Visit (INDEPENDENT_AMBULATORY_CARE_PROVIDER_SITE_OTHER): Payer: Medicare Other | Admitting: *Deleted

## 2019-11-26 DIAGNOSIS — I63412 Cerebral infarction due to embolism of left middle cerebral artery: Secondary | ICD-10-CM | POA: Diagnosis not present

## 2019-11-26 LAB — CUP PACEART REMOTE DEVICE CHECK
Date Time Interrogation Session: 20210228232717
Implantable Pulse Generator Implant Date: 20181227

## 2019-11-26 NOTE — Progress Notes (Signed)
ILR Remote 

## 2019-12-09 ENCOUNTER — Other Ambulatory Visit: Payer: Self-pay | Admitting: Cardiovascular Disease

## 2019-12-10 ENCOUNTER — Other Ambulatory Visit: Payer: Self-pay | Admitting: Internal Medicine

## 2019-12-24 ENCOUNTER — Other Ambulatory Visit: Payer: Self-pay | Admitting: Cardiovascular Disease

## 2019-12-27 ENCOUNTER — Ambulatory Visit (INDEPENDENT_AMBULATORY_CARE_PROVIDER_SITE_OTHER): Payer: Medicare Other | Admitting: *Deleted

## 2019-12-27 DIAGNOSIS — I63412 Cerebral infarction due to embolism of left middle cerebral artery: Secondary | ICD-10-CM

## 2019-12-27 LAB — CUP PACEART REMOTE DEVICE CHECK
Date Time Interrogation Session: 20210401002956
Implantable Pulse Generator Implant Date: 20181227

## 2019-12-27 NOTE — Progress Notes (Signed)
ILR Remote 

## 2020-01-07 DIAGNOSIS — Z8673 Personal history of transient ischemic attack (TIA), and cerebral infarction without residual deficits: Secondary | ICD-10-CM | POA: Diagnosis not present

## 2020-01-07 DIAGNOSIS — E039 Hypothyroidism, unspecified: Secondary | ICD-10-CM | POA: Diagnosis not present

## 2020-01-07 DIAGNOSIS — I10 Essential (primary) hypertension: Secondary | ICD-10-CM | POA: Diagnosis not present

## 2020-01-07 DIAGNOSIS — R413 Other amnesia: Secondary | ICD-10-CM | POA: Diagnosis not present

## 2020-01-07 DIAGNOSIS — Z1331 Encounter for screening for depression: Secondary | ICD-10-CM | POA: Diagnosis not present

## 2020-01-07 DIAGNOSIS — M1990 Unspecified osteoarthritis, unspecified site: Secondary | ICD-10-CM | POA: Diagnosis not present

## 2020-01-07 DIAGNOSIS — Z66 Do not resuscitate: Secondary | ICD-10-CM | POA: Diagnosis not present

## 2020-01-09 ENCOUNTER — Encounter: Payer: Medicare Other | Admitting: Internal Medicine

## 2020-01-09 ENCOUNTER — Other Ambulatory Visit: Payer: Self-pay

## 2020-01-10 ENCOUNTER — Other Ambulatory Visit: Payer: Self-pay

## 2020-01-10 ENCOUNTER — Ambulatory Visit (INDEPENDENT_AMBULATORY_CARE_PROVIDER_SITE_OTHER): Payer: Medicare Other | Admitting: Cardiovascular Disease

## 2020-01-10 ENCOUNTER — Encounter: Payer: Self-pay | Admitting: Cardiovascular Disease

## 2020-01-10 VITALS — BP 124/82 | HR 54 | Ht 62.0 in | Wt 121.5 lb

## 2020-01-10 DIAGNOSIS — I10 Essential (primary) hypertension: Secondary | ICD-10-CM

## 2020-01-10 DIAGNOSIS — I63412 Cerebral infarction due to embolism of left middle cerebral artery: Secondary | ICD-10-CM | POA: Diagnosis not present

## 2020-01-10 DIAGNOSIS — E782 Mixed hyperlipidemia: Secondary | ICD-10-CM

## 2020-01-10 NOTE — Progress Notes (Signed)
Bianca Garcia Date of Birth  1941-07-16       [address]    [place]     [place],   [address]      Problem list: 1. History of chest pain-normal coronary artery heart catheterization Had coronary artery calcifications by CT scan   2. Hyperlipidemia 3. Hypertension 4. Hypothyroidism 5. CVA     Pt is doing well from a cardiac standpoint.  She continues to have anxiety.  She takes Klonipin on occasion which helps.  She has not had any cardiac problems.  Feb 20, 2016:  Bianca Garcia was seen by [place] of abdominal issues.   Got a CT scan  Was noted to incidentally found to have right coronary artery calcifications.   Does have some mild chest pressure like sensation with working outside, especially if on an incline.  Records from [place] were reviewed.   Chol = 247 Trigs = 89 HDL = 71 LDL 158.   Is having lots of dizziness with the Bus[place].    Oct. 18, 2017: She is doing well Seems to be tolerating the amlodipine  No longer has this vague chest tightness. ( ? Esophageal spasm )   January 20, 2017:  Bianca Garcia is seen today for follow-up visit. BP is doing well.  Still has some CP ,  myoview was negative in 2017.   Has had a normal cath in the past.    Saw her primary MD - tried Sertraline . She is feeling better after stopping the Atorvastatin  Doing well on the amlodipine   Oct. 25, 2018 Continues to have frequent episodes of CP  Seems to be associated frequently with anxiety . Is having some CP right now Will last for a few minuets Constant ache  No worsened with exercise or walking   Was not able to tolerate the atrovastatin   January 19, 2018: Bianca Garcia is seen today for follow-up of her hypertension, hyperlipidemia and chest pains.  She was admitted on December 25 with a stroke.  She received TPA with good functional recovery.  Transesophageal echocardiogram was unremarkable.  She has an implantable loop  recorder. Echocardiogram during that admission reveals normal left ventricular systolic function with EF of 55 to 60%.  She has grade 2 diastolic dysfunction.  She has mild pulmonary artery hypertension with estimated PA pressure of 36 mmHg.  She presented to the emergency room again on December 21, 2017 with some dizziness and left facial numbness.  Her symptoms resolved spontaneously.  MRI of the brain was negative for an acute infarction.  The patient was discharged in stable condition.  She continues to fall on occasion .    Falling episodes are much worse since the CVA .   Symptoms seem to be related to orthostatic hypotension  Chronic CP .   May 25, 2017:  Bianca Garcia seen today for follow-up visit.  She has a history of chest pain.  She is had heart catheterization in the past which did not reveal any significant coronary artery disease.  She had a stroke on September 20, 2017: She had TPA with resolution of her symptoms.  She was started on aspirin and Plavix.  Is in [place].   Is under lots of stress  Has constant chest pain , also has a headache , has had difficulty getting her words out since her stroke  Very fatigued .   Coronary CT angiogram performed in December, 2018 revealed very mild  plaque in the proximal LAD.  There is no significant stenosis.  Her calcium score is 74 Agatston units placing the patient in the 51st percentile for age and gender  Having lots of orthostatic symptoms , Still eats lots of salty foods ( salty foods )   October 27, 2018:  Bianca Garcia is seen today for follow-up visit.  She has a long history of hypertension.  She has a history of noncardiac chest pain.  We have performed a heart catheterization and a coronary CT angiogram both of which showed normal coronary arteries.  She has a history of hyperlipidemia and is on Crestor. Has had a history of a stroke and is on Plavix.  Seems to be doing well. Is still dizzy. No CP  Overall seems to be doing  well   January 10, 2020:  Bianca Garcia seen back for follow-up visit.  She has a history of hypertension.  She is had a TIA in the past.  She is for heart catheterization for some atypical chest pain but she had normal coronary arteries.  She has a history of hyperlipidemia and is on Crestor. Dr. Dagmar Hait has  Says she has constant dyspnea .  Not worsens with exertion Seems to be related to anxiety .  Had a CVA.  Had a loop recorder placed in Dec. 2018.     Current Outpatient Medications  Medication Sig Dispense Refill  . Calcium Carbonate-Vitamin D (CALCIUM + D PO) Take 1 tablet by mouth daily.     . clonazePAM (KLONOPIN) 0.5 MG tablet Take 0.025 mg by mouth as needed.    . clopidogrel (PLAVIX) 75 MG tablet Take 1 tablet (75 mg total) by mouth daily. 30 tablet 1  . cycloSPORINE (RESTASIS) 0.05 % ophthalmic emulsion Apply to eye.    . levothyroxine (SYNTHROID, LEVOTHROID) 88 MCG tablet Take 88 mcg by mouth daily.    . Multiple Vitamins-Minerals (PRESERVISION AREDS PO) Take 1 tablet by mouth 2 (two) times daily.     . naproxen sodium (ALEVE) 220 MG tablet Take 220 mg by mouth as needed (chest pain).    . propranolol (INDERAL) 40 MG tablet TAKE 1 TABLET (40 MG TOTAL) BY MOUTH 2 (TWO) TIMES DAILY. PLEASE CALL [phone] TO SCHEDULE AN APPT. 180 tablet 0  . Propylene Glycol (SYSTANE BALANCE OP) Apply 1 drop to eye daily. Reported on 02/27/2016    . rosuvastatin (CRESTOR) 5 MG tablet Take 1 tablet (5 mg total) by mouth daily at 6 PM. Please make overdue appt with Dr. Acie Fredrickson before anymore refills. 2nd attempt 15 tablet 0   No current facility-administered medications for this visit.   Family History  Problem Relation Age of Onset  . Bladder Cancer Father   . Mitral valve prolapse Mother   . Cancer Maternal Grandfather    Smoked briefly as a teenager.   Allergies  Allergen Reactions  . Fluticasone Other (See Comments)    DRY EYES  . Codeine Nausea Only  . Lipitor [Atorvastatin Calcium]      Intolerant   . Rosuvastatin     Intolerant   . Sulfa Antibiotics     vomiting    Past Medical History:  Diagnosis Date  . Anxiety   . Arthritis   . Biceps tendonitis 01/05/2013  . 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 CTS (carpal tunnel syndrome) 1995  . DJD (degenerative joint disease) 2016   DDD with spinal stenosis. Street of back injections by Dr. Brien Few 2016 with good relief, history of cervical  DD with possiblity of surgery by Dr. Trenton Gammon.  Marland Kitchen GERD (gastroesophageal reflux disease)    IBS with moderate refluc seen on upper GI study form January 2011 Dr. Watt Climes  . HTN (hypertension)    no meds  . Hyperlipidemia   . Hypothyroidism   . IBS (irritable bowel syndrome)   . Impingement syndrome of right shoulder 01/05/2013  . Mass of left side of neck   . Mood disorder ([place])   . MVP (mitral valve prolapse)    Hx of   . Osteopenia   . Peripheral edema   . Right rotator cuff tear 01/05/2013  . Sciatic pain    recurrent  . Seasonal allergies   . Stroke ([place])   . Tension headache   . Tremor     Past Surgical History:  Procedure Laterality Date  . ANTERIOR CERVICAL DECOMP/DISCECTOMY FUSION  04/2015    Dr. Pamala Hurry, [place]  . CARDIAC CATHETERIZATION  2004   which revealed smooth and normal coronary arteries  . CARPAL TUNNEL RELEASE  2000   lt  . CATARACT EXTRACTION W/ INTRAOCULAR LENS  IMPLANT, BILATERAL  1/17   Dr. Lucita Ferrara  . COSMETIC SURGERY  2010   eyes-facial  . DENTAL SURGERY  1960  . LASIK    . LOOP RECORDER INSERTION N/A 09/22/2017   Procedure: LOOP RECORDER INSERTION;  Surgeon: Constance Haw, MD;  Location: [facility];  Service: Cardiovascular;  Laterality: N/A;  . SHOULDER ARTHROSCOPY WITH ROTATOR CUFF REPAIR Right 01/05/2013   Procedure: SHOULDER ARTHROSCOPY WITH ARTHROSCOPIC  ROTATOR CUFF REPAIR, ACROMIOPLASTY, EXTENSIVE DEBRIDEMENT;  Surgeon: Johnny Bridge, MD;  Location: [facility];  Service: Orthopedics;  Laterality: Right;  . TEE  WITHOUT CARDIOVERSION N/A 09/22/2017   Procedure: TRANSESOPHAGEAL ECHOCARDIOGRAM (TEE);  Surgeon: Sanda Klein, MD;  Location: [facility] ENDOSCOPY;  Service: Cardiovascular;  Laterality: N/A;  . TONSILLECTOMY    . TUBAL LIGATION      Social History   Tobacco Use  Smoking Status Former Smoker  . Quit date: 01/05/1960  . Years since quitting: 60.0  Smokeless Tobacco Never Used  Tobacco Comment   Remote Hx    Social History   Substance and Sexual Activity  Alcohol Use Yes  . Alcohol/week: 7.0 standard drinks  . Types: 7 Cans of beer per week   Comment: daily beer    Family History  Problem Relation Age of Onset  . Bladder Cancer Father   . Mitral valve prolapse Mother   . Cancer Maternal Grandfather     Reviw of Systems:  Reviewed in the HPI.  All other systems are negative.  Physical Exam: Blood pressure 124/82, pulse (!) 54, height 5\' 2"  (1.575 m), weight 121 lb 8 oz (55.1 kg), last menstrual period 09/28/1979, SpO2 99 %.  GEN:  Elderly female , NAD  HEENT: Normal NECK: No JVD; No carotid bruits LYMPHATICS: No lymphadenopathy CARDIAC: RRR , no murmurs, rubs, gallops RESPIRATORY:  Clear to auscultation without rales, wheezing or rhonchi  ABDOMEN: Soft, non-tender, non-distended MUSCULOSKELETAL:  No edema; No deformity  SKIN: Warm and dry NEUROLOGIC:  Alert and oriented x 3    ECG: January 10, 2020: Sinus bradycardia 54 beats a minute.  No ST or T wave changes.  Assessment / Plan:   1.  Transient ischemic attack-she still has an implantable loop in place.  So for the episodes of paroxysmal atrial fibrillation.  2.  Essential hypertension:   Blood pressures well controlled.  Continue current medications.  3.  Hyperlipidemia:     Lipid levels are managed by Dr. Elsworth Soho.  Continue Crestor.  4.  Hypothyroidism:      5.  Chest pain : She has fairly constant episodes of chest pain but these do not sound ischemic.  Some of this might be due to anxiety.  See her again in  1 year.    [name], MD  01/10/2020 4:35 PM    [address],  [place], [place]  [address] Pager [phone] Phone: [phone]; Fax: [phone]

## 2020-01-10 NOTE — Patient Instructions (Signed)

## 2020-01-13 DIAGNOSIS — E039 Hypothyroidism, unspecified: Secondary | ICD-10-CM | POA: Diagnosis not present

## 2020-01-13 DIAGNOSIS — Z8673 Personal history of transient ischemic attack (TIA), and cerebral infarction without residual deficits: Secondary | ICD-10-CM | POA: Diagnosis not present

## 2020-01-13 DIAGNOSIS — R42 Dizziness and giddiness: Secondary | ICD-10-CM | POA: Diagnosis not present

## 2020-01-13 DIAGNOSIS — Z882 Allergy status to sulfonamides status: Secondary | ICD-10-CM | POA: Diagnosis not present

## 2020-01-13 DIAGNOSIS — Z79899 Other long term (current) drug therapy: Secondary | ICD-10-CM | POA: Diagnosis not present

## 2020-01-13 DIAGNOSIS — R531 Weakness: Secondary | ICD-10-CM | POA: Diagnosis not present

## 2020-01-13 DIAGNOSIS — R0789 Other chest pain: Secondary | ICD-10-CM | POA: Diagnosis not present

## 2020-01-13 DIAGNOSIS — H532 Diplopia: Secondary | ICD-10-CM | POA: Diagnosis not present

## 2020-01-13 DIAGNOSIS — G459 Transient cerebral ischemic attack, unspecified: Secondary | ICD-10-CM | POA: Diagnosis not present

## 2020-01-13 DIAGNOSIS — Z880 Allergy status to penicillin: Secondary | ICD-10-CM | POA: Diagnosis not present

## 2020-01-13 DIAGNOSIS — R2689 Other abnormalities of gait and mobility: Secondary | ICD-10-CM | POA: Diagnosis not present

## 2020-01-13 DIAGNOSIS — I10 Essential (primary) hypertension: Secondary | ICD-10-CM | POA: Diagnosis not present

## 2020-01-13 DIAGNOSIS — Z9181 History of falling: Secondary | ICD-10-CM | POA: Diagnosis not present

## 2020-01-13 DIAGNOSIS — R270 Ataxia, unspecified: Secondary | ICD-10-CM | POA: Diagnosis not present

## 2020-01-13 DIAGNOSIS — Z888 Allergy status to other drugs, medicaments and biological substances status: Secondary | ICD-10-CM | POA: Diagnosis not present

## 2020-01-14 DIAGNOSIS — R42 Dizziness and giddiness: Secondary | ICD-10-CM | POA: Diagnosis not present

## 2020-01-14 DIAGNOSIS — R0789 Other chest pain: Secondary | ICD-10-CM | POA: Diagnosis not present

## 2020-01-14 DIAGNOSIS — R270 Ataxia, unspecified: Secondary | ICD-10-CM | POA: Diagnosis not present

## 2020-01-27 LAB — CUP PACEART REMOTE DEVICE CHECK
Date Time Interrogation Session: 20210502003351
Implantable Pulse Generator Implant Date: 20181227

## 2020-01-28 ENCOUNTER — Ambulatory Visit (INDEPENDENT_AMBULATORY_CARE_PROVIDER_SITE_OTHER): Payer: Medicare Other | Admitting: *Deleted

## 2020-01-28 DIAGNOSIS — I639 Cerebral infarction, unspecified: Secondary | ICD-10-CM | POA: Diagnosis not present

## 2020-01-29 NOTE — Progress Notes (Signed)
Carelink Summary Report / Loop Recorder 

## 2020-02-15 DIAGNOSIS — M545 Low back pain: Secondary | ICD-10-CM | POA: Diagnosis not present

## 2020-02-28 ENCOUNTER — Ambulatory Visit (INDEPENDENT_AMBULATORY_CARE_PROVIDER_SITE_OTHER): Payer: Medicare Other | Admitting: *Deleted

## 2020-02-28 DIAGNOSIS — I639 Cerebral infarction, unspecified: Secondary | ICD-10-CM

## 2020-02-28 LAB — CUP PACEART REMOTE DEVICE CHECK
Date Time Interrogation Session: 20210602230311
Implantable Pulse Generator Implant Date: 20181227

## 2020-03-01 ENCOUNTER — Other Ambulatory Visit: Payer: Self-pay | Admitting: Cardiovascular Disease

## 2020-03-04 NOTE — Progress Notes (Signed)
Carelink Summary Report / Loop Recorder 

## 2020-04-01 ENCOUNTER — Ambulatory Visit (INDEPENDENT_AMBULATORY_CARE_PROVIDER_SITE_OTHER): Payer: Medicare Other | Admitting: *Deleted

## 2020-04-01 DIAGNOSIS — I639 Cerebral infarction, unspecified: Secondary | ICD-10-CM

## 2020-04-01 LAB — CUP PACEART REMOTE DEVICE CHECK
Date Time Interrogation Session: 20210706021827
Implantable Pulse Generator Implant Date: 20181227

## 2020-04-02 NOTE — Progress Notes (Signed)
Carelink Summary Report / Loop Recorder 

## 2020-04-30 ENCOUNTER — Encounter: Payer: Medicare Other | Admitting: Internal Medicine

## 2020-04-30 ENCOUNTER — Other Ambulatory Visit: Payer: Self-pay

## 2020-05-01 ENCOUNTER — Encounter: Payer: Medicare Other | Admitting: Internal Medicine

## 2020-05-05 ENCOUNTER — Ambulatory Visit (INDEPENDENT_AMBULATORY_CARE_PROVIDER_SITE_OTHER): Payer: Medicare Other | Admitting: *Deleted

## 2020-05-05 DIAGNOSIS — I63412 Cerebral infarction due to embolism of left middle cerebral artery: Secondary | ICD-10-CM

## 2020-05-05 LAB — CUP PACEART REMOTE DEVICE CHECK
Date Time Interrogation Session: 20210808021833
Implantable Pulse Generator Implant Date: 20181227

## 2020-05-06 NOTE — Progress Notes (Signed)
Carelink Summary Report / Loop Recorder 

## 2020-05-07 DIAGNOSIS — M47816 Spondylosis without myelopathy or radiculopathy, lumbar region: Secondary | ICD-10-CM | POA: Diagnosis not present

## 2020-05-15 DIAGNOSIS — M47816 Spondylosis without myelopathy or radiculopathy, lumbar region: Secondary | ICD-10-CM | POA: Diagnosis not present

## 2020-05-15 DIAGNOSIS — M545 Low back pain: Secondary | ICD-10-CM | POA: Diagnosis not present

## 2020-05-21 LAB — LIPID PANEL
HDL: 75 — AB (ref 35–70)
LDL Cholesterol: 86

## 2020-05-22 DIAGNOSIS — M47812 Spondylosis without myelopathy or radiculopathy, cervical region: Secondary | ICD-10-CM | POA: Diagnosis not present

## 2020-05-22 DIAGNOSIS — M5416 Radiculopathy, lumbar region: Secondary | ICD-10-CM | POA: Diagnosis not present

## 2020-05-22 DIAGNOSIS — I10 Essential (primary) hypertension: Secondary | ICD-10-CM | POA: Diagnosis not present

## 2020-05-22 DIAGNOSIS — F419 Anxiety disorder, unspecified: Secondary | ICD-10-CM | POA: Diagnosis not present

## 2020-05-22 DIAGNOSIS — E039 Hypothyroidism, unspecified: Secondary | ICD-10-CM | POA: Diagnosis not present

## 2020-05-22 DIAGNOSIS — M47816 Spondylosis without myelopathy or radiculopathy, lumbar region: Secondary | ICD-10-CM | POA: Diagnosis not present

## 2020-05-22 DIAGNOSIS — R2689 Other abnormalities of gait and mobility: Secondary | ICD-10-CM | POA: Diagnosis not present

## 2020-05-22 DIAGNOSIS — K219 Gastro-esophageal reflux disease without esophagitis: Secondary | ICD-10-CM | POA: Diagnosis not present

## 2020-05-22 DIAGNOSIS — M8580 Other specified disorders of bone density and structure, unspecified site: Secondary | ICD-10-CM | POA: Diagnosis not present

## 2020-05-22 DIAGNOSIS — K589 Irritable bowel syndrome without diarrhea: Secondary | ICD-10-CM | POA: Diagnosis not present

## 2020-05-22 DIAGNOSIS — I25119 Atherosclerotic heart disease of native coronary artery with unspecified angina pectoris: Secondary | ICD-10-CM | POA: Diagnosis not present

## 2020-05-22 DIAGNOSIS — I639 Cerebral infarction, unspecified: Secondary | ICD-10-CM | POA: Diagnosis not present

## 2020-05-28 ENCOUNTER — Encounter: Payer: Self-pay | Admitting: Internal Medicine

## 2020-05-28 ENCOUNTER — Non-Acute Institutional Stay: Payer: Medicare Other | Admitting: Internal Medicine

## 2020-05-28 ENCOUNTER — Other Ambulatory Visit: Payer: Self-pay

## 2020-05-28 VITALS — BP 138/82 | HR 75 | Temp 97.7°F | Ht 62.0 in | Wt 126.1 lb

## 2020-05-28 DIAGNOSIS — F0150 Vascular dementia without behavioral disturbance: Secondary | ICD-10-CM | POA: Diagnosis not present

## 2020-05-28 DIAGNOSIS — G540 Brachial plexus disorders: Secondary | ICD-10-CM

## 2020-05-28 DIAGNOSIS — F419 Anxiety disorder, unspecified: Secondary | ICD-10-CM | POA: Diagnosis not present

## 2020-05-28 DIAGNOSIS — E039 Hypothyroidism, unspecified: Secondary | ICD-10-CM | POA: Diagnosis not present

## 2020-05-28 DIAGNOSIS — I10 Essential (primary) hypertension: Secondary | ICD-10-CM | POA: Diagnosis not present

## 2020-05-28 DIAGNOSIS — H3530 Unspecified macular degeneration: Secondary | ICD-10-CM | POA: Diagnosis not present

## 2020-05-28 DIAGNOSIS — G250 Essential tremor: Secondary | ICD-10-CM

## 2020-05-28 DIAGNOSIS — M25562 Pain in left knee: Secondary | ICD-10-CM | POA: Diagnosis not present

## 2020-05-28 DIAGNOSIS — I679 Cerebrovascular disease, unspecified: Secondary | ICD-10-CM

## 2020-05-28 DIAGNOSIS — I63412 Cerebral infarction due to embolism of left middle cerebral artery: Secondary | ICD-10-CM | POA: Diagnosis not present

## 2020-05-28 DIAGNOSIS — K589 Irritable bowel syndrome without diarrhea: Secondary | ICD-10-CM | POA: Diagnosis not present

## 2020-05-28 DIAGNOSIS — E782 Mixed hyperlipidemia: Secondary | ICD-10-CM
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
## 2020-05-29 DIAGNOSIS — M25562 Pain in left knee: Secondary | ICD-10-CM | POA: Diagnosis not present

## 2020-05-30 ENCOUNTER — Encounter: Payer: Self-pay | Admitting: Adult Health

## 2020-05-30 ENCOUNTER — Non-Acute Institutional Stay: Payer: Medicare Other | Admitting: Adult Health

## 2020-05-30 DIAGNOSIS — I10 Essential (primary) hypertension: Secondary | ICD-10-CM | POA: Diagnosis not present

## 2020-05-30 DIAGNOSIS — E039 Hypothyroidism, unspecified: Secondary | ICD-10-CM | POA: Diagnosis not present

## 2020-05-30 DIAGNOSIS — I639 Cerebral infarction, unspecified: Secondary | ICD-10-CM | POA: Diagnosis not present

## 2020-05-30 DIAGNOSIS — S8002XA Contusion of left knee, initial encounter: Secondary | ICD-10-CM

## 2020-05-30 DIAGNOSIS — W19XXXA Unspecified fall, initial encounter: Secondary | ICD-10-CM

## 2020-05-30 DIAGNOSIS — M25562 Pain in left knee: Secondary | ICD-10-CM

## 2020-05-30 DIAGNOSIS — D519 Vitamin B12 deficiency anemia, unspecified: Secondary | ICD-10-CM | POA: Diagnosis not present

## 2020-05-30 DIAGNOSIS — I25119 Atherosclerotic heart disease of native coronary artery with unspecified angina pectoris: Secondary | ICD-10-CM | POA: Diagnosis not present

## 2020-05-30 DIAGNOSIS — D649 Anemia, unspecified: Secondary | ICD-10-CM | POA: Diagnosis not present

## 2020-05-30 LAB — BASIC METABOLIC PANEL
BUN: 10 (ref 4–21)
CO2: 25 — AB (ref 13–22)
Chloride: 101 (ref 99–108)
Creatinine: 0.7 (ref 0.5–1.1)
Glucose: 112
Potassium: 4.2 (ref 3.4–5.3)
Sodium: 139 (ref 137–147)

## 2020-05-30 LAB — HEPATIC FUNCTION PANEL
ALT: 14 (ref 7–35)
AST: 17 (ref 13–35)

## 2020-05-30 LAB — LIPID PANEL
Cholesterol: 209 — AB (ref 0–200)
HDL: 74 — AB (ref 35–70)
LDL Cholesterol: 105
Triglycerides: 151 (ref 40–160)

## 2020-05-30 LAB — COMPREHENSIVE METABOLIC PANEL
Albumin: 4.2 (ref 3.5–5.0)
Calcium: 9.7 (ref 8.7–10.7)
Globulin: 2.6

## 2020-05-30 NOTE — Progress Notes (Signed)
Location:  Occupational psychologist of Service:  ALF (13) Provider:   Cindi Carbon, ANP [place] [phone]   Gayland Curry, DO  Patient Care Team: Gayland Curry, DO as PCP - General (Geriatric Medicine) Nahser, Wonda Cheng, MD as PCP - Cardiology (Cardiology) Clarene Essex, MD as Consulting Physician (Gastroenterology) Marcial Pacas, MD as Consulting Physician (Neurology) Kristeen Miss, MD as Consulting Physician (Neurosurgery) [place], Amy, MD as Consulting Physician (Dermatology) Megan Salon, MD as Consulting Physician (Gynecology) [place], Heather, Dola [facility])  Extended Emergency Contact Information Primary Emergency Contact: Forker,Robert J Address: [address]          [place], [place] [address] [name] of [place] Phone: [phone] Work Phone: [phone] Mobile Phone: [phone] Relation: Spouse Secondary Emergency Contact: Quitman Livings of [place] Phone: [phone] Mobile Phone: [phone] Relation: Son  Goals of care: Advanced Directive information Advanced Directives 05/28/2020  Does Patient Have a Medical Advance Directive? Yes  Does patient want to make changes to medical advance directive? No - Patient declined  Would patient like information on creating a medical advance directive? -     Chief Complaint  Patient presents with  . Acute Visit    fall with left knee pain    HPI:  Pt is a 79 y.o. female seen today for an acute visit for a fall and left knee pain. On 9/2 she had a mechanical fall and injured her left knee. Prior to the fall she had been experiencing some left knee pain and muscle stiffness and PT was ordered. A left knee xray was ordered which showed no acute fracture, however, an osteophyte was noted. At this time Ms. Kartes has some swelling and bruising to the left knee. It hurts to bear weight. She does not have any radicular pain, numbness, tingling, etc.    Past  Medical History:  Diagnosis Date  . Anxiety   . Arthritis   . Biceps tendonitis 01/05/2013  . 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 CTS (carpal tunnel syndrome) 1995  . DJD (degenerative joint disease) 2016   DDD with spinal stenosis. Street of back injections by Dr. Brien Few 2016 with good relief, history of cervical DD with possiblity of surgery by Dr. [place] Gammon.  Marland Kitchen GERD (gastroesophageal reflux disease)    IBS with moderate refluc seen on upper GI study form January 2011 Dr. Watt Climes  . HTN (hypertension)    no meds  . Hyperlipidemia   . Hypothyroidism   . IBS (irritable bowel syndrome)   . Impingement syndrome of right shoulder 01/05/2013  . Mass of left side of neck   . Mood disorder ([place])   . MVP (mitral valve prolapse)    Hx of   . Osteopenia   . Peripheral edema   . Right rotator cuff tear 01/05/2013  . Sciatic pain    recurrent  . Seasonal allergies   . Stroke ([place])   . Tension headache   . Tremor    Past Surgical History:  Procedure Laterality Date  . ANTERIOR CERVICAL DECOMP/DISCECTOMY FUSION  04/2015    Dr. Pamala Hurry, [place]  . CARDIAC CATHETERIZATION  2004   which revealed smooth and normal coronary arteries  . CARPAL TUNNEL RELEASE  2000   lt  . CATARACT EXTRACTION W/ INTRAOCULAR LENS  IMPLANT, BILATERAL  1/17   Dr. Lucita Ferrara  . COSMETIC SURGERY  2010   eyes-facial  . DENTAL SURGERY  1960  . LASIK    .  LOOP RECORDER INSERTION N/A 09/22/2017   Procedure: LOOP RECORDER INSERTION;  Surgeon: Constance Haw, MD;  Location: [place] [facility];  Service: Cardiovascular;  Laterality: N/A;  . SHOULDER ARTHROSCOPY WITH ROTATOR CUFF REPAIR Right 01/05/2013   Procedure: SHOULDER ARTHROSCOPY WITH ARTHROSCOPIC  ROTATOR CUFF REPAIR, ACROMIOPLASTY, EXTENSIVE DEBRIDEMENT;  Surgeon: Johnny Bridge, MD;  Location: [facility];  Service: Orthopedics;  Laterality: Right;  . TEE WITHOUT CARDIOVERSION N/A 09/22/2017   Procedure: TRANSESOPHAGEAL ECHOCARDIOGRAM (TEE);   Surgeon: Sanda Klein, MD;  Location: [facility] ENDOSCOPY;  Service: Cardiovascular;  Laterality: N/A;  . TONSILLECTOMY    . TUBAL LIGATION      Allergies  Allergen Reactions  . Fluticasone Other (See Comments)    DRY EYES  . Codeine Nausea Only  . Lipitor [Atorvastatin Calcium]     Intolerant   . Rosuvastatin     Intolerant   . Sulfa Antibiotics     vomiting  . Pneumovax 23 [Pneumococcal Vac Polyvalent] Rash    Injection site reaction    Outpatient Encounter Medications as of 05/30/2020  Medication Sig  . aspirin (ASPIRIN 81) 81 MG chewable tablet Chew 81 mg by mouth daily.  . Calcium Carbonate-Vitamin D (CALCIUM + D PO) Take 1 tablet by mouth daily.   . clonazePAM (KLONOPIN) 0.5 MG tablet Take 0.025 mg by mouth as needed.  . clopidogrel (PLAVIX) 75 MG tablet Take 1 tablet (75 mg total) by mouth daily.  Marland Kitchen donepezil (ARICEPT) 10 MG tablet Take 10 mg by mouth at bedtime.  Marland Kitchen levothyroxine (SYNTHROID, LEVOTHROID) 88 MCG tablet Take 88 mcg by mouth daily.  . Multiple Vitamins-Minerals (PRESERVISION AREDS PO) Take 1 tablet by mouth 2 (two) times daily.   Marland Kitchen omeprazole (PRILOSEC) 40 MG capsule Take 40 mg by mouth daily.  . propranolol (INDERAL) 40 MG tablet TAKE 1 TABLET (40 MG TOTAL) BY MOUTH 2 (TWO) TIMES DAILY. PLEASE CALL [phone] TO SCHEDULE AN APPT.  . rosuvastatin (CRESTOR) 5 MG tablet Take 1 tablet (5 mg total) by mouth daily.   No facility-administered encounter medications on file as of 05/30/2020.    Review of Systems  Constitutional: Negative for activity change, appetite change, chills, diaphoresis, fatigue, fever and unexpected weight change.  Musculoskeletal: Positive for arthralgias, gait problem and joint swelling.       Left knee pain  Skin: Positive for color change. Negative for pallor, rash and wound.  Psychiatric/Behavioral: Positive for confusion. Negative for agitation and behavioral problems.    Immunization History  Administered Date(s) Administered  .  Influenza, High Dose Seasonal PF 07/23/2017, 07/03/2019  . Tdap 09/27/2001   Pertinent  Health Maintenance Due  Topic Date Due  . PNA vac Low Risk Adult (1 of 2 - PCV13) Never done  . INFLUENZA VACCINE  04/27/2020  . DEXA SCAN  Completed   Fall Risk  05/28/2020 03/01/2018 11/23/2017  Falls in the past year? 0 No Yes  Number falls in past yr: 0 - 2 or more  Injury with Fall? 0 - No   Functional Status Survey:    There were no vitals filed for this visit. There is no height or weight on file to calculate BMI. Physical Exam Vitals and nursing note reviewed.  Constitutional:      Appearance: Normal appearance.  HENT:     Head: Normocephalic and atraumatic.  Cardiovascular:     Comments: BPPP 2+ Musculoskeletal:        General: Swelling (left knee. Would not allow ROM to  be performed), tenderness and signs of injury present. No deformity.     Right lower leg: No edema.     Left lower leg: No edema.  Skin:    Coloration: Skin is not pale.     Findings: Bruising (left anterior knee) present. No erythema or rash.  Neurological:     General: No focal deficit present.     Mental Status: She is alert. Mental status is at baseline.  Psychiatric:        Mood and Affect: Mood normal.     Labs reviewed: No results for input(s): NA, K, CL, CO2, GLUCOSE, BUN, CREATININE, CALCIUM, MG, PHOS in the last 8760 hours. No results for input(s): AST, ALT, ALKPHOS, BILITOT, PROT, ALBUMIN in the last 8760 hours. No results for input(s): WBC, NEUTROABS, HGB, HCT, MCV, PLT in the last 8760 hours. Lab Results  Component Value Date   TSH 0.774 08/01/2017   Lab Results  Component Value Date   HGBA1C 5.6 09/21/2017   Lab Results  Component Value Date   CHOL 151 09/21/2017   HDL 73 09/21/2017   LDLCALC 71 09/21/2017   TRIG 35 09/21/2017   CHOLHDL 2.1 09/21/2017    Significant Diagnostic Results in last 30 days:  CUP PACEART REMOTE DEVICE CHECK  Result Date: 05/05/2020 Carelink summary report  received. Battery status OK. Normal device function. No new symptom episodes, tachy episodes, brady, or pause episodes. No new AF episodes. Monthly summary reports and ROV/PRN JM   Assessment/Plan 1. Fall, initial encounter Mechanical in nature, unclear how it happened per pt and her son Recommend calling for help with transfers, to the BR, and walker use until knee improves  2. Contusion of left knee, initial encounter Ice 15 min tid with light ace wrap x 72 hrs Elevate Monitor and report if no improvement or worsening   3. Acute pain of left knee Due OA/osteophyte, along with recent contusion Increase tylenol 1000 mg tid to complete 14 day course  PT eval pending until next week    Family/ staff Communication: discussed with the resident and her son  Labs/tests ordered:  NA

## 2020-06-04 DIAGNOSIS — S8002XS Contusion of left knee, sequela: Secondary | ICD-10-CM | POA: Diagnosis not present

## 2020-06-04 DIAGNOSIS — R278 Other lack of coordination: Secondary | ICD-10-CM | POA: Diagnosis not present

## 2020-06-04 DIAGNOSIS — R2689 Other abnormalities of gait and mobility: Secondary | ICD-10-CM | POA: Diagnosis not present

## 2020-06-04 DIAGNOSIS — Z9181 History of falling: Secondary | ICD-10-CM | POA: Diagnosis not present

## 2020-06-04 DIAGNOSIS — M25562 Pain in left knee: Secondary | ICD-10-CM | POA: Diagnosis not present

## 2020-06-04 DIAGNOSIS — Z8673 Personal history of transient ischemic attack (TIA), and cerebral infarction without residual deficits: Secondary | ICD-10-CM | POA: Diagnosis not present

## 2020-06-05 DIAGNOSIS — E039 Hypothyroidism, unspecified: Secondary | ICD-10-CM | POA: Diagnosis not present

## 2020-06-05 DIAGNOSIS — Z8673 Personal history of transient ischemic attack (TIA), and cerebral infarction without residual deficits: Secondary | ICD-10-CM | POA: Diagnosis not present

## 2020-06-05 DIAGNOSIS — I25119 Atherosclerotic heart disease of native coronary artery with unspecified angina pectoris: Secondary | ICD-10-CM | POA: Diagnosis not present

## 2020-06-05 DIAGNOSIS — I10 Essential (primary) hypertension: Secondary | ICD-10-CM | POA: Diagnosis not present

## 2020-06-05 DIAGNOSIS — S8002XS Contusion of left knee, sequela: Secondary | ICD-10-CM | POA: Diagnosis not present

## 2020-06-05 DIAGNOSIS — R413 Other amnesia: Secondary | ICD-10-CM | POA: Diagnosis not present

## 2020-06-05 DIAGNOSIS — K589 Irritable bowel syndrome without diarrhea: Secondary | ICD-10-CM | POA: Diagnosis not present

## 2020-06-05 DIAGNOSIS — E785 Hyperlipidemia, unspecified: Secondary | ICD-10-CM | POA: Diagnosis not present

## 2020-06-05 DIAGNOSIS — I639 Cerebral infarction, unspecified: Secondary | ICD-10-CM | POA: Diagnosis not present

## 2020-06-05 DIAGNOSIS — R2689 Other abnormalities of gait and mobility: Secondary | ICD-10-CM | POA: Diagnosis not present

## 2020-06-05 DIAGNOSIS — Z66 Do not resuscitate: Secondary | ICD-10-CM | POA: Diagnosis not present

## 2020-06-05 DIAGNOSIS — F419 Anxiety disorder, unspecified: Secondary | ICD-10-CM | POA: Diagnosis not present

## 2020-06-05 DIAGNOSIS — M5416 Radiculopathy, lumbar region: Secondary | ICD-10-CM | POA: Diagnosis not present

## 2020-06-05 DIAGNOSIS — Z9181 History of falling: Secondary | ICD-10-CM | POA: Diagnosis not present

## 2020-06-05 DIAGNOSIS — M25562 Pain in left knee: Secondary | ICD-10-CM | POA: Diagnosis not present

## 2020-06-05 DIAGNOSIS — R278 Other lack of coordination: Secondary | ICD-10-CM | POA: Diagnosis not present

## 2020-06-05 DIAGNOSIS — K219 Gastro-esophageal reflux disease without esophagitis: Secondary | ICD-10-CM | POA: Diagnosis not present

## 2020-06-05 DIAGNOSIS — F39 Unspecified mood [affective] disorder: Secondary | ICD-10-CM | POA: Diagnosis not present

## 2020-06-09 ENCOUNTER — Ambulatory Visit (INDEPENDENT_AMBULATORY_CARE_PROVIDER_SITE_OTHER): Payer: Medicare Other | Admitting: *Deleted

## 2020-06-09 DIAGNOSIS — Z8673 Personal history of transient ischemic attack (TIA), and cerebral infarction without residual deficits: Secondary | ICD-10-CM | POA: Diagnosis not present

## 2020-06-09 DIAGNOSIS — I639 Cerebral infarction, unspecified: Secondary | ICD-10-CM | POA: Diagnosis not present

## 2020-06-09 DIAGNOSIS — Z9181 History of falling: Secondary | ICD-10-CM | POA: Diagnosis not present

## 2020-06-09 DIAGNOSIS — R2689 Other abnormalities of gait and mobility: Secondary | ICD-10-CM | POA: Diagnosis not present

## 2020-06-09 DIAGNOSIS — R278 Other lack of coordination: Secondary | ICD-10-CM | POA: Diagnosis not present

## 2020-06-09 DIAGNOSIS — M25562 Pain in left knee: Secondary | ICD-10-CM | POA: Diagnosis not present

## 2020-06-09 DIAGNOSIS — S8002XS Contusion of left knee, sequela: Secondary | ICD-10-CM | POA: Diagnosis not present

## 2020-06-09 LAB — CUP PACEART REMOTE DEVICE CHECK
Date Time Interrogation Session: 20210910022141
Implantable Pulse Generator Implant Date: 20181227

## 2020-06-10 DIAGNOSIS — M25561 Pain in right knee: Secondary | ICD-10-CM | POA: Diagnosis not present

## 2020-06-10 DIAGNOSIS — R030 Elevated blood-pressure reading, without diagnosis of hypertension: Secondary | ICD-10-CM | POA: Diagnosis not present

## 2020-06-10 DIAGNOSIS — G8929 Other chronic pain: Secondary | ICD-10-CM | POA: Diagnosis not present

## 2020-06-11 DIAGNOSIS — R2689 Other abnormalities of gait and mobility: Secondary | ICD-10-CM | POA: Diagnosis not present

## 2020-06-11 DIAGNOSIS — Z9181 History of falling: Secondary | ICD-10-CM | POA: Diagnosis not present

## 2020-06-11 DIAGNOSIS — R278 Other lack of coordination: Secondary | ICD-10-CM | POA: Diagnosis not present

## 2020-06-11 DIAGNOSIS — Z8673 Personal history of transient ischemic attack (TIA), and cerebral infarction without residual deficits: Secondary | ICD-10-CM | POA: Diagnosis not present

## 2020-06-11 DIAGNOSIS — S8002XS Contusion of left knee, sequela: Secondary | ICD-10-CM | POA: Diagnosis not present

## 2020-06-11 DIAGNOSIS — M25562 Pain in left knee: Secondary | ICD-10-CM | POA: Diagnosis not present

## 2020-06-11 NOTE — Progress Notes (Signed)
Carelink Summary Report / Loop Recorder 

## 2020-06-12 DIAGNOSIS — S8002XS Contusion of left knee, sequela: Secondary | ICD-10-CM | POA: Diagnosis not present

## 2020-06-12 DIAGNOSIS — R278 Other lack of coordination: Secondary | ICD-10-CM | POA: Diagnosis not present

## 2020-06-12 DIAGNOSIS — Z8673 Personal history of transient ischemic attack (TIA), and cerebral infarction without residual deficits: Secondary | ICD-10-CM | POA: Diagnosis not present

## 2020-06-12 DIAGNOSIS — R2689 Other abnormalities of gait and mobility: Secondary | ICD-10-CM | POA: Diagnosis not present

## 2020-06-12 DIAGNOSIS — M25562 Pain in left knee: Secondary | ICD-10-CM | POA: Diagnosis not present

## 2020-06-12 DIAGNOSIS — Z9181 History of falling: Secondary | ICD-10-CM | POA: Diagnosis not present

## 2020-06-16 DIAGNOSIS — Z9181 History of falling: Secondary | ICD-10-CM | POA: Diagnosis not present

## 2020-06-16 DIAGNOSIS — M25562 Pain in left knee: Secondary | ICD-10-CM | POA: Diagnosis not present

## 2020-06-16 DIAGNOSIS — Z8673 Personal history of transient ischemic attack (TIA), and cerebral infarction without residual deficits: Secondary | ICD-10-CM | POA: Diagnosis not present

## 2020-06-16 DIAGNOSIS — S8002XS Contusion of left knee, sequela: Secondary | ICD-10-CM | POA: Diagnosis not present

## 2020-06-16 DIAGNOSIS — R278 Other lack of coordination: Secondary | ICD-10-CM | POA: Diagnosis not present

## 2020-06-16 DIAGNOSIS — R2689 Other abnormalities of gait and mobility: Secondary | ICD-10-CM | POA: Diagnosis not present

## 2020-06-20 ENCOUNTER — Encounter: Payer: Self-pay | Admitting: Internal Medicine

## 2020-06-20 DIAGNOSIS — S8002XS Contusion of left knee, sequela: Secondary | ICD-10-CM | POA: Diagnosis not present

## 2020-06-20 DIAGNOSIS — M25562 Pain in left knee: Secondary | ICD-10-CM | POA: Diagnosis not present

## 2020-06-20 DIAGNOSIS — R2689 Other abnormalities of gait and mobility: Secondary | ICD-10-CM | POA: Diagnosis not present

## 2020-06-20 DIAGNOSIS — R278 Other lack of coordination: Secondary | ICD-10-CM | POA: Diagnosis not present

## 2020-06-20 DIAGNOSIS — Z8673 Personal history of transient ischemic attack (TIA), and cerebral infarction without residual deficits: Secondary | ICD-10-CM | POA: Diagnosis not present

## 2020-06-20 DIAGNOSIS — Z9181 History of falling: Secondary | ICD-10-CM | POA: Diagnosis not present

## 2020-06-25 DIAGNOSIS — Z8673 Personal history of transient ischemic attack (TIA), and cerebral infarction without residual deficits: Secondary | ICD-10-CM | POA: Diagnosis not present

## 2020-06-25 DIAGNOSIS — Z9181 History of falling: Secondary | ICD-10-CM | POA: Diagnosis not present

## 2020-06-25 DIAGNOSIS — M25562 Pain in left knee: Secondary | ICD-10-CM | POA: Diagnosis not present

## 2020-06-25 DIAGNOSIS — R278 Other lack of coordination: Secondary | ICD-10-CM | POA: Diagnosis not present

## 2020-06-25 DIAGNOSIS — S8002XS Contusion of left knee, sequela: Secondary | ICD-10-CM | POA: Diagnosis not present

## 2020-06-25 DIAGNOSIS — R2689 Other abnormalities of gait and mobility: Secondary | ICD-10-CM | POA: Diagnosis not present

## 2020-07-03 ENCOUNTER — Encounter: Payer: Self-pay | Admitting: Internal Medicine

## 2020-08-07 DIAGNOSIS — Z23 Encounter for immunization: Secondary | ICD-10-CM | POA: Diagnosis not present

## 2020-08-11 ENCOUNTER — Telehealth: Payer: Self-pay

## 2020-08-11 NOTE — Telephone Encounter (Signed)
Pt misplaced her monitor when she moved. I told her I will order her a new monitor and she should receive it in 7-10 business days.

## 2020-08-14 DIAGNOSIS — E039 Hypothyroidism, unspecified: Secondary | ICD-10-CM | POA: Diagnosis not present

## 2020-08-14 LAB — TSH: TSH: 4.59 (ref 0.41–5.90)

## 2020-08-26 ENCOUNTER — Other Ambulatory Visit: Payer: Self-pay

## 2020-08-26 MED ORDER — PROPRANOLOL HCL 40 MG PO TABS
40.0000 mg | ORAL_TABLET | Freq: Two times a day (BID) | ORAL | 0 refills | Status: DC
Start: 2020-08-26 — End: 2021-07-27

## 2020-08-27 ENCOUNTER — Encounter: Payer: Self-pay | Admitting: Internal Medicine

## 2020-09-10 ENCOUNTER — Other Ambulatory Visit: Payer: Self-pay

## 2020-09-10 ENCOUNTER — Encounter: Payer: Medicare Other | Admitting: Internal Medicine

## 2020-09-10 ENCOUNTER — Encounter: Payer: Self-pay | Admitting: Internal Medicine

## 2020-09-12 NOTE — Progress Notes (Signed)
This encounter was created in error - please disregard.

## 2020-09-17 ENCOUNTER — Encounter: Payer: Self-pay | Admitting: Internal Medicine

## 2020-09-17 ENCOUNTER — Other Ambulatory Visit: Payer: Self-pay

## 2020-09-17 ENCOUNTER — Non-Acute Institutional Stay: Payer: Medicare Other | Admitting: Internal Medicine

## 2020-09-17 VITALS — BP 142/78 | HR 60 | Temp 97.4°F | Ht 61.0 in | Wt 125.0 lb

## 2020-09-17 DIAGNOSIS — G8929 Other chronic pain: Secondary | ICD-10-CM

## 2020-09-17 DIAGNOSIS — E039 Hypothyroidism, unspecified: Secondary | ICD-10-CM | POA: Diagnosis not present

## 2020-09-17 DIAGNOSIS — G540 Brachial plexus disorders: Secondary | ICD-10-CM

## 2020-09-17 DIAGNOSIS — I63412 Cerebral infarction due to embolism of left middle cerebral artery: Secondary | ICD-10-CM

## 2020-09-17 DIAGNOSIS — M25562 Pain in left knee: Secondary | ICD-10-CM | POA: Diagnosis not present

## 2020-09-17 DIAGNOSIS — G250 Essential tremor: Secondary | ICD-10-CM

## 2020-09-17 DIAGNOSIS — I10 Essential (primary) hypertension: Secondary | ICD-10-CM

## 2020-09-17 DIAGNOSIS — F0150 Vascular dementia without behavioral disturbance: Secondary | ICD-10-CM

## 2020-09-17 DIAGNOSIS — F419 Anxiety disorder, unspecified: Secondary | ICD-10-CM | POA: Diagnosis not present

## 2020-09-17 NOTE — Progress Notes (Signed)
Location:  [facility] of Service:  Clinic (12)  Provider: Kaylaann Mountz L. Mariea Clonts, D.O., C.M.D.  Code Status: DNR, has living will and HCPOA also, but not yet scanned into vynca  Goals of Care:  Advanced Directives 09/17/2020  Does Patient Have a Medical Advance Directive? Yes  Type of Advance Directive Living will;Out of facility DNR (pink MOST or yellow form)  Does patient want to make changes to medical advance directive? No - Patient declined  Would patient like information on creating a medical advance directive? -  Pre-existing out of facility DNR order (yellow form or pink MOST form) Pink MOST/Yellow Form most recent copy in chart - Physician notified to receive inpatient order   Chief Complaint  Patient presents with  . Medical Management of Chronic Issues    3 month follow up    HPI: Patient is a 79 y.o. female seen today for medical management of chronic diseases.    Left knee does act up and limit her walking.  Cannot go for long walks with the dog.  Got halfway around it.  Suggested use of otc voltaren gel when pain is severe.  No longer using sleeve for support--says it does not give way.  Got 25/30 on 06/01/20 on MMSE.    BP slightly better at end of visit as listed.    Past Medical History:  Diagnosis Date  . Anxiety   . Arthritis   . Biceps tendonitis 01/05/2013  . 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 CTS (carpal tunnel syndrome) 1995  . Degenerative disc disease, cervical   . DJD (degenerative joint disease) 2016   DDD with spinal stenosis. Street of back injections by Dr. Brien Few 2016 with good relief, history of cervical DD with possiblity of surgery by Dr. Trenton Gammon.  Marland Kitchen GERD (gastroesophageal reflux disease)    IBS with moderate refluc seen on upper GI study form January 2011 Dr. Watt Climes  . HTN (hypertension)    no meds  . Hyperlipidemia   . Hypothyroidism   . IBS (irritable bowel syndrome)   . Imbalance   . Impingement syndrome of  right shoulder 01/05/2013  . Mass of left side of neck   . Memory loss   . Mood disorder ([place])   . MVP (mitral valve prolapse)    Hx of   . Osteopenia   . Peripheral edema   . Right rotator cuff tear 01/05/2013  . Sciatic pain    recurrent  . Seasonal allergies   . Stroke ([place])   . Tension headache   . Tremor     Past Surgical History:  Procedure Laterality Date  . ANTERIOR CERVICAL DECOMP/DISCECTOMY FUSION  04/2015    Dr. Pamala Hurry, [place]  . CARDIAC CATHETERIZATION  2004   which revealed smooth and normal coronary arteries  . CARPAL TUNNEL RELEASE  2000   lt  . CATARACT EXTRACTION W/ INTRAOCULAR LENS  IMPLANT, BILATERAL  1/17   Dr. Lucita Ferrara  . COSMETIC SURGERY  2010   eyes-facial  . DENTAL SURGERY  1960  . LASIK    . LOOP RECORDER INSERTION N/A 09/22/2017   Procedure: LOOP RECORDER INSERTION;  Surgeon: Constance Haw, MD;  Location: [facility];  Service: Cardiovascular;  Laterality: N/A;  . SHOULDER ARTHROSCOPY WITH ROTATOR CUFF REPAIR Right 01/05/2013   Procedure: SHOULDER ARTHROSCOPY WITH ARTHROSCOPIC  ROTATOR CUFF REPAIR, ACROMIOPLASTY, EXTENSIVE DEBRIDEMENT;  Surgeon: Johnny Bridge, MD;  Location: [facility];  Service: Orthopedics;  Laterality: Right;  . TEE WITHOUT CARDIOVERSION N/A 09/22/2017   Procedure: TRANSESOPHAGEAL ECHOCARDIOGRAM (TEE);  Surgeon: Sanda Klein, MD;  Location: [facility] ENDOSCOPY;  Service: Cardiovascular;  Laterality: N/A;  . TONSILLECTOMY    . TUBAL LIGATION      Allergies  Allergen Reactions  . Fluticasone Other (See Comments)    DRY EYES  . Aciphex [Rabeprazole]     Critical   . Codeine Nausea Only  . Cymbalta [Duloxetine Hcl]     Critical   . Lexapro [Escitalopram]     critical  . Lipitor [Atorvastatin Calcium]     Intolerant   . Pneumovax [Pneumococcal Polysaccharide Vaccine]     moderate  . Prevacid [Lansoprazole]   . Rosuvastatin     Intolerant   . Sertraline     critical  . Statins      Critical   . Sulfa Antibiotics     vomiting  . Tetracyclines & Related     Morphine Sulfate  . Pneumovax 23 [Pneumococcal Vac Polyvalent] Rash    Injection site reaction    Outpatient Encounter Medications as of 09/17/2020  Medication Sig  . acetaminophen (TYLENOL) 325 MG tablet Take 650 mg by mouth 2 (two) times daily.  Marland Kitchen aspirin 81 MG chewable tablet Chew 81 mg by mouth daily.  . Calcium Carbonate-Vitamin D (CALCIUM + D PO) Take 1 tablet by mouth daily.   . clonazePAM (KLONOPIN) 0.5 MG tablet Take 0.25 mg by mouth 2 (two) times daily as needed (for anxiety and shakiness).  . clopidogrel (PLAVIX) 75 MG tablet Take 1 tablet (75 mg total) by mouth daily.  Marland Kitchen donepezil (ARICEPT) 10 MG tablet Take 10 mg by mouth at bedtime.  Marland Kitchen levothyroxine (SYNTHROID) 100 MCG tablet Take 100 mcg by mouth daily before breakfast.  . Multiple Vitamins-Minerals (PRESERVISION AREDS PO) Take 1 tablet by mouth 2 (two) times daily.   Marland Kitchen omeprazole (PRILOSEC) 40 MG capsule Take 40 mg by mouth daily.  . propranolol (INDERAL) 40 MG tablet Take 1 tablet (40 mg total) by mouth 2 (two) times daily. Please call [phone] to schedule an appt.  . rosuvastatin (CRESTOR) 5 MG tablet Take 1 tablet (5 mg total) by mouth daily.   No facility-administered encounter medications on file as of 09/17/2020.    Review of Systems:  Review of Systems  Constitutional: Negative for chills, fever and malaise/fatigue.  HENT: Positive for hearing loss. Negative for congestion and sore throat.   Eyes: Negative for blurred vision.  Respiratory: Negative for cough and shortness of breath.   Cardiovascular: Negative for chest pain, palpitations and leg swelling.  Gastrointestinal: Negative for abdominal pain, blood in stool, constipation, diarrhea and melena.  Genitourinary: Negative for dysuria.  Musculoskeletal: Positive for joint pain and myalgias. Negative for falls.  Skin: Negative for itching and rash.  Neurological: Negative for  dizziness and loss of consciousness.  Endo/Heme/Allergies: Bruises/bleeds easily.  Psychiatric/Behavioral: Positive for memory loss. Negative for depression. The patient is nervous/anxious. The patient does not have insomnia.        Says nerves are a lot better now than when she moved into [place]    Health Maintenance  Topic Date Due  . PNA vac Low Risk Adult (2 of 2 - PCV13) 01/29/2016  . TETANUS/TDAP  08/18/2022  . INFLUENZA VACCINE  Completed  . DEXA SCAN  Completed  . COVID-19 Vaccine  Completed    Physical Exam: Vitals:   09/17/20 1123  BP: (!) 148/74  Pulse: 60  Temp: Marland Kitchen)  97.4 F (36.3 C)  SpO2: 98%  Weight: 125 lb (56.7 kg)  Height: 5\' 1"  (1.549 m)   Body mass index is 23.62 kg/m. Physical Exam Vitals reviewed.  Constitutional:      General: She is not in acute distress.    Appearance: Normal appearance. She is not toxic-appearing.  HENT:     Head: Normocephalic and atraumatic.  Cardiovascular:     Rate and Rhythm: Normal rate and regular rhythm.     Pulses: Normal pulses.     Heart sounds: Normal heart sounds.  Pulmonary:     Effort: Pulmonary effort is normal.     Breath sounds: Normal breath sounds. No wheezing, rhonchi or rales.  Abdominal:     General: Bowel sounds are normal.  Musculoskeletal:        General: Normal range of motion.     Right lower leg: No edema.     Left lower leg: No edema.     Comments: Left medial knee tenderness with small effusion  Neurological:     General: No focal deficit present.     Mental Status: She is alert.     Gait: Gait abnormal.     Comments: Oriented to person, place, not time and unable to find her way around the building  Psychiatric:        Mood and Affect: Mood normal.     Labs reviewed: Basic Metabolic Panel: Recent Labs    05/30/20 0000  NA 139  K 4.2  CL 101  CO2 25*  BUN 10  CREATININE 0.7  CALCIUM 9.7   Liver Function Tests: Recent Labs    05/30/20 0000  AST 17  ALT 14  ALBUMIN 4.2    No results for input(s): LIPASE, AMYLASE in the last 8760 hours. No results for input(s): AMMONIA in the last 8760 hours. CBC: No results for input(s): WBC, NEUTROABS, HGB, HCT, MCV, PLT in the last 8760 hours. Lipid Panel: Recent Labs    05/21/20 0000 05/30/20 0000  CHOL  --  209*  HDL 75* 74*  LDLCALC 86 105  TRIG  --  151   Lab Results  Component Value Date   HGBA1C 5.6 09/21/2017    Assessment/Plan 1. Vascular dementia without behavioral disturbance (HCC) -mild on mmse but seems to be moderate  -cont aricept and [place] level of care  2. Essential tremor -stable on propranolol  3. 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 Essential hypertension -bp a bit elevated at visit today; appears to range on unit from 120s to 150s so might add med for bp next time if remaining higher more often (she's not able to be as active as she once was)  5. Anxiety -chronic, cont klonopin bid prn--has been longstanding for her  6. Hypothyroidism, unspecified type -cont current levothyroxine therapy -last tsh was November, but I'm now seeing it was not abstracted  Lab Results  Component Value Date   TSH 0.59 08/28/2018    7.  Left knee pain:  Now chronic -? Meniscal injury with fall -remains ambulatory just not long distances -cont tylenol, may also use otc voltaren gel 4g qid prn severe pain  Labs/tests ordered:  No new added today Next appt:  6 mos in [place]. Tomie Elko, D.O. [facility] [address], [place] [address] Cell Phone (Mon-Fri 8am-5pm):  [phone] On Call:  [phone] & follow prompts after 5pm & weekends Office Phone:  [phone] Office Fax:  [phone]

## 2020-09-29 ENCOUNTER — Telehealth: Payer: Self-pay | Admitting: *Deleted

## 2020-09-29 NOTE — Telephone Encounter (Signed)
Received fax from [facility], [place] #[phone].   In receipt of a LTC Claim filed on behalf of patient. Requesting additional information of NEXT and LAST OV.   Form filled out and faxed back to [employer] Fax: [phone]  Paperwork sent for scanning.

## 2020-10-22 ENCOUNTER — Telehealth: Payer: Self-pay

## 2020-10-22 NOTE — Telephone Encounter (Signed)
Dr.Fox called requesting to speak directly with Dr.Reed regarding her responses to a form completed on 07/02/2019 for patient. Dr.Fox works for [employer] and is available M-F 7:00 am- 3:30 pm

## 2020-10-24 NOTE — Telephone Encounter (Signed)
Please advise if you spoke with Dr.Fox, he had questions about the paperwork and some of your responses, this information was not disclosed to me as he wanted to speak with you directly.   I understand the patient established in September, the paperwork was completed a month after in October.

## 2020-10-24 NOTE — Telephone Encounter (Signed)
Just left him a message this morning in case the form was filled out in October last year not 2020.  Await call back.

## 2020-10-24 NOTE — Telephone Encounter (Signed)
It looks like paperwork was completed by me here recently (she lives in [place] now) but she was not a [facility] patient until September of 2021.

## 2020-11-17 ENCOUNTER — Encounter: Payer: Self-pay | Admitting: Internal Medicine

## 2020-11-18 DIAGNOSIS — Z1231 Encounter for screening mammogram for malignant neoplasm of breast: Secondary | ICD-10-CM | POA: Diagnosis not present

## 2020-11-19 ENCOUNTER — Encounter: Payer: Self-pay | Admitting: Internal Medicine

## 2020-11-19 ENCOUNTER — Other Ambulatory Visit: Payer: Self-pay

## 2020-11-19 ENCOUNTER — Non-Acute Institutional Stay: Payer: Medicare Other | Admitting: Internal Medicine

## 2020-11-19 VITALS — BP 122/78 | HR 64 | Temp 97.1°F | Ht 61.0 in | Wt 133.4 lb

## 2020-11-19 DIAGNOSIS — F0150 Vascular dementia without behavioral disturbance: Secondary | ICD-10-CM | POA: Diagnosis not present

## 2020-11-19 DIAGNOSIS — M25562 Pain in left knee: Secondary | ICD-10-CM | POA: Diagnosis not present

## 2020-11-19 DIAGNOSIS — E039 Hypothyroidism, unspecified: Secondary | ICD-10-CM

## 2020-11-19 DIAGNOSIS — G250 Essential tremor: Secondary | ICD-10-CM

## 2020-11-19 DIAGNOSIS — F419 Anxiety disorder, unspecified: Secondary | ICD-10-CM

## 2020-11-19 DIAGNOSIS — G8929 Other chronic pain: Secondary | ICD-10-CM

## 2020-11-19 DIAGNOSIS — G540 Brachial plexus disorders: Secondary | ICD-10-CM

## 2020-11-19 DIAGNOSIS — R635 Abnormal weight gain: Secondary | ICD-10-CM | POA: Diagnosis not present

## 2020-11-19 DIAGNOSIS — M48062 Spinal stenosis, lumbar region with neurogenic claudication: Secondary | ICD-10-CM

## 2020-11-19 MED ORDER — GABAPENTIN 100 MG PO CAPS: 100.0000 mg | ORAL_CAPSULE | Freq: Every day | ORAL | 11 refills | Status: DC

## 2020-11-19 NOTE — Progress Notes (Signed)
Location:  [place] of Service:  Clinic (12)  Provider: Ofilia Rayon L. Mariea Clonts, D.O., C.M.D.  Code Status: DNR Goals of Care:  Advanced Directives 09/17/2020  Does Patient Have a Medical Advance Directive? Yes  Type of Advance Directive Living will;Out of facility DNR (pink MOST or yellow form)  Does patient want to make changes to medical advance directive? No - Patient declined  Would patient like information on creating a medical advance directive? -  Pre-existing out of facility DNR order (yellow form or pink MOST form) Pink MOST/Yellow Form most recent copy in chart - Physician notified to receive inpatient order     Chief Complaint  Patient presents with  . Medical Management of Chronic Issues    Medical Management of Chronic Issues. To discuss costs of medications and sleeping issues.     HPI: Patient is a 80 y.o. female seen today for an acute visit for  She's had spinal curvature when she saw Dr. Annette Stable.  She aches across the lower back and burns down the backs of her legs.  Lying down also gives her pain relief.  She also does it because she doesn't have anything else to do.  She is always ready to go eat--eats three meals per day on schedule--even early if she can.  She has gained 12 lbs since October.  They had to buy her new pants.  She used to just eat coffee yogurt.    Past Medical History:  Diagnosis Date  . Anxiety   . Arthritis   . Biceps tendonitis 01/05/2013  . 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 CTS (carpal tunnel syndrome) 1995  . Degenerative disc disease, cervical   . DJD (degenerative joint disease) 2016   DDD with spinal stenosis. Street of back injections by Dr. Brien Few 2016 with good relief, history of cervical DD with possiblity of surgery by Dr. Trenton Gammon.  Marland Kitchen GERD (gastroesophageal reflux disease)    IBS with moderate refluc seen on upper GI study form January 2011 Dr. Watt Climes  . HTN (hypertension)    no meds  . Hyperlipidemia   . Hypothyroidism    . IBS (irritable bowel syndrome)   . Imbalance   . Impingement syndrome of right shoulder 01/05/2013  . Mass of left side of neck   . Memory loss   . Mood disorder ([place])   . MVP (mitral valve prolapse)    Hx of   . Osteopenia   . Peripheral edema   . Right rotator cuff tear 01/05/2013  . Sciatic pain    recurrent  . Seasonal allergies   . Stroke ([place])   . Tension headache   . Tremor     Past Surgical History:  Procedure Laterality Date  . ANTERIOR CERVICAL DECOMP/DISCECTOMY FUSION  04/2015    Dr. Pamala Hurry, [place]  . CARDIAC CATHETERIZATION  2004   which revealed smooth and normal coronary arteries  . CARPAL TUNNEL RELEASE  2000   lt  . CATARACT EXTRACTION W/ INTRAOCULAR LENS  IMPLANT, BILATERAL  1/17   Dr. Lucita Ferrara  . COSMETIC SURGERY  2010   eyes-facial  . DENTAL SURGERY  1960  . LASIK    . LOOP RECORDER INSERTION N/A 09/22/2017   Procedure: LOOP RECORDER INSERTION;  Surgeon: Constance Haw, MD;  Location: [facility];  Service: Cardiovascular;  Laterality: N/A;  . SHOULDER ARTHROSCOPY WITH ROTATOR CUFF REPAIR Right 01/05/2013   Procedure: SHOULDER ARTHROSCOPY WITH ARTHROSCOPIC  ROTATOR CUFF REPAIR, ACROMIOPLASTY, EXTENSIVE DEBRIDEMENT;  Surgeon: Johnny Bridge, MD;  Location: [facility];  Service: Orthopedics;  Laterality: Right;  . TEE WITHOUT CARDIOVERSION N/A 09/22/2017   Procedure: TRANSESOPHAGEAL ECHOCARDIOGRAM (TEE);  Surgeon: Sanda Klein, MD;  Location: [facility] ENDOSCOPY;  Service: Cardiovascular;  Laterality: N/A;  . TONSILLECTOMY    . TUBAL LIGATION      Allergies  Allergen Reactions  . Fluticasone Other (See Comments)    DRY EYES  . Aciphex [Rabeprazole]     Critical   . Codeine Nausea Only  . Cymbalta [Duloxetine Hcl]     Critical   . Lexapro [Escitalopram]     critical  . Lipitor [Atorvastatin Calcium]     Intolerant   . Pneumovax [Pneumococcal Polysaccharide Vaccine]     moderate  . Prevacid [Lansoprazole]   .  Rosuvastatin     Intolerant   . Sertraline     critical  . Statins     Critical   . Sulfa Antibiotics     vomiting  . Tetracyclines & Related     Morphine Sulfate  . Pneumovax 23 [Pneumococcal Vac Polyvalent] Rash    Injection site reaction    Outpatient Encounter Medications as of 11/19/2020  Medication Sig  . acetaminophen (TYLENOL) 325 MG tablet Take 650 mg by mouth 2 (two) times daily.  Marland Kitchen aspirin 81 MG chewable tablet Chew 81 mg by mouth daily.  . Calcium Carbonate-Vitamin D (CALCIUM + D PO) Take 1 tablet by mouth daily.   . clonazePAM (KLONOPIN) 0.5 MG tablet Take 0.25 mg by mouth 2 (two) times daily as needed (for anxiety and shakiness).  . clopidogrel (PLAVIX) 75 MG tablet Take 1 tablet (75 mg total) by mouth daily.  Marland Kitchen donepezil (ARICEPT) 10 MG tablet Take 10 mg by mouth at bedtime.  Marland Kitchen levothyroxine (SYNTHROID) 100 MCG tablet Take 100 mcg by mouth daily before breakfast.  . melatonin 5 MG TABS Take 5 mg by mouth at bedtime as needed.  . Multiple Vitamins-Minerals (PRESERVISION AREDS PO) Take 1 tablet by mouth 2 (two) times daily.   Marland Kitchen omeprazole (PRILOSEC) 40 MG capsule Take 40 mg by mouth daily.  . propranolol (INDERAL) 40 MG tablet Take 1 tablet (40 mg total) by mouth 2 (two) times daily. Please call [phone] to schedule an appt.  . rosuvastatin (CRESTOR) 5 MG tablet Take 1 tablet (5 mg total) by mouth daily.   No facility-administered encounter medications on file as of 11/19/2020.    Review of Systems:  Review of Systems  Constitutional: Negative for chills and fever.  HENT: Negative for congestion and sore throat.   Eyes: Negative for blurred vision.       Macular degeneration  Respiratory: Negative for cough and shortness of breath.   Cardiovascular: Negative for chest pain, palpitations and leg swelling.  Gastrointestinal: Negative for abdominal pain and constipation.  Genitourinary: Negative for dysuria.  Musculoskeletal: Positive for back pain and joint pain.  Negative for falls.       Low back, left knee, back of legs  Skin: Negative for rash.  Neurological: Positive for tingling and sensory change. Negative for loss of consciousness.       Burning pain across lower back and down back of thighs  Psychiatric/Behavioral: Positive for memory loss. Negative for depression. The patient is nervous/anxious. The patient does not have insomnia.     Health Maintenance  Topic Date Due  . TETANUS/TDAP  08/18/2022  . INFLUENZA VACCINE  Completed  . DEXA SCAN  Completed  .  COVID-19 Vaccine  Completed  . PNA vac Low Risk Adult  Completed    Physical Exam: Vitals:   11/19/20 1430  BP: 122/78  Pulse: 64  Temp: (!) 97.1 F (36.2 C)  TempSrc: Oral  SpO2: 98%  Weight: 133 lb 6.4 oz (60.5 kg)  Height: 5\' 1"  (1.549 m)   Body mass index is 25.21 kg/m. Physical Exam Vitals reviewed.  Constitutional:      General: She is not in acute distress.    Appearance: Normal appearance. She is not toxic-appearing.  Cardiovascular:     Rate and Rhythm: Normal rate and regular rhythm.     Pulses: Normal pulses.     Heart sounds: Normal heart sounds.  Pulmonary:     Effort: Pulmonary effort is normal.     Breath sounds: Normal breath sounds. No wheezing, rhonchi or rales.  Musculoskeletal:        General: Normal range of motion.     Right lower leg: No edema.     Left lower leg: No edema.  Neurological:     General: No focal deficit present.     Mental Status: She is alert.     Gait: Gait abnormal.     Comments: Leans forward; kyphoscoliosis  Psychiatric:        Mood and Affect: Mood normal.     Labs reviewed: Basic Metabolic Panel: Recent Labs    05/30/20 0000  NA 139  K 4.2  CL 101  CO2 25*  BUN 10  CREATININE 0.7  CALCIUM 9.7   Liver Function Tests: Recent Labs    05/30/20 0000  AST 17  ALT 14  ALBUMIN 4.2   No results for input(s): LIPASE, AMYLASE in the last 8760 hours. No results for input(s): AMMONIA in the last 8760  hours. CBC: No results for input(s): WBC, NEUTROABS, HGB, HCT, MCV, PLT in the last 8760 hours. Lipid Panel: Recent Labs    05/21/20 0000 05/30/20 0000  CHOL  --  209*  HDL 75* 74*  LDLCALC 86 105  TRIG  --  151   Lab Results  Component Value Date   HGBA1C 5.6 09/21/2017    Procedures since last visit: No results found.  Assessment/Plan 1. Spinal stenosis of lumbar region with neurogenic claudication -try gabapentin 100mg  at hs -staff to notify me if not having improvement in pain in two weeks but tolerating in which case I will increase her medication to 300mg  at hs  2. Vascular dementia without behavioral disturbance ([place]) -progressing gradually, may also have some AD -cont donepezil, scored 25/30 on mmse and passed clock a year ago  3. Essential tremor -cont propranolol--sounds like tremor developed due to #4 after a fall with rotator cuff injury  4. Brachial plexus palsy -with some right hand tremor, cont propranolol, no reported pain now  5. Anxiety -has prn clonazepam available for anxiety and shaking spells but her husband reports it's not been used (both have memory loss)--explained that it requires them to ask for it  6. Chronic pain of left knee -s/p fall with negative xrays for fx--had swelling and still get occasional lateral neuropathic pain there, but nothing consistent -not going to have surgery so no reason to do more testing  7. Acquired hypothyroidism -cont current levothyroxine, last tsh at upper limits of normal,  f/u tsh has been ordered by np for 6 mos  8.  Weight gain -since late fall when they moved to [place]--she's apparently eating a full breakfast when she  used to eat just yogurt so suggested they return to that routine if this is a problem to them -BMI just 25.21 now and that's ok in her age group to be sure she has reserves in case of illness  -they have had to buy her new pants as she did gain 12 lbs so her husband seems bothered more by it  and pt somewhat unaware due to her memory loss  Labs/tests ordered:  Has tsh already ordered for future  Next appt:  03/04/2021 in [place]. Shandreka Dante, D.O. [facility] [address], [place] [address] Cell Phone (Mon-Fri 8am-5pm):  [phone] On Call:  [phone] & follow prompts after 5pm & weekends Office Phone:  [phone] Office Fax:  [phone]

## 2021-01-20 DIAGNOSIS — Q6689 Other  specified congenital deformities of feet: Secondary | ICD-10-CM | POA: Diagnosis not present

## 2021-01-20 DIAGNOSIS — L602 Onychogryphosis: Secondary | ICD-10-CM | POA: Diagnosis not present

## 2021-01-29 ENCOUNTER — Non-Acute Institutional Stay: Payer: Medicare Other | Admitting: Adult Health

## 2021-01-29 ENCOUNTER — Encounter: Payer: Self-pay | Admitting: Adult Health

## 2021-01-29 DIAGNOSIS — M48062 Spinal stenosis, lumbar region with neurogenic claudication: Secondary | ICD-10-CM

## 2021-01-29 NOTE — Progress Notes (Signed)
Location:    [facility] Room Number: 528 Place of Service:  ALF [phone]) Provider:  Royal Hawthorn, NP  Virgie Dad, MD  Patient Care Team: Virgie Dad, MD as PCP - General (Internal Medicine) Nahser, Wonda Cheng, MD as PCP - Cardiology (Cardiology) Clarene Essex, MD as Consulting Physician (Gastroenterology) Marcial Pacas, MD as Consulting Physician (Neurology) Kristeen Miss, MD as Consulting Physician (Neurosurgery) Martinique, Amy, MD as Consulting Physician (Dermatology) Megan Salon, MD as Consulting Physician (Gynecology) [place], Heather, [facility]  Extended Emergency Contact Information Primary Emergency Contact: Brault,Robert J Address: [address]          [place], [place] [address] [name] of [place] Phone: [phone] Work Phone: [phone] Mobile Phone: [phone] Relation: Spouse Secondary Emergency Contact: Quitman Livings of [place] Phone: [phone] Mobile Phone: [phone] Relation: Son  Code Status:  DNR Goals of care: Advanced Directive information Advanced Directives 09/17/2020  Does Patient Have a Medical Advance Directive? Yes  Type of Advance Directive Living will;Out of facility DNR (pink MOST or yellow form)  Does patient want to make changes to medical advance directive? No - Patient declined  Would patient like information on creating a medical advance directive? -  Pre-existing out of facility DNR order (yellow form or pink MOST form) Pink MOST/Yellow Form most recent copy in chart - Physician notified to receive inpatient order     Chief Complaint  Patient presents with  . Acute Visit    Back pain     HPI:  Pt is a 80 y.o. female seen today for an acute visit for back pain.   Bianca Garcia has a hx of spinal stenosis with claudication and was started on Neurontin each night by Dr Mariea Clonts in Feb.  She reports her pain is a 8/10 to both hips with burning. No pain down the  leg or numbness or tingling. No change in B/B.  States her pain is well controlled at night but present most of the day, worse with standing or walking. I don't see any recent imaging studies but our notes say she has seen Dr. Annette Stable for this problem in the past.  Past Medical History:  Diagnosis Date  . Anxiety   . Arthritis   . Biceps tendonitis 01/05/2013  . 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 CTS (carpal tunnel syndrome) 1995  . Degenerative disc disease, cervical   . DJD (degenerative joint disease) 2016   DDD with spinal stenosis. Street of back injections by Dr. Brien Few 2016 with good relief, history of cervical DD with possiblity of surgery by Dr. Trenton Gammon.  Marland Kitchen GERD (gastroesophageal reflux disease)    IBS with moderate refluc seen on upper GI study form January 2011 Dr. Watt Climes  . HTN (hypertension)    no meds  . Hyperlipidemia   . Hypothyroidism   . IBS (irritable bowel syndrome)   . Imbalance   . Impingement syndrome of right shoulder 01/05/2013  . Mass of left side of neck   . Memory loss   . Mood disorder ([place])   . MVP (mitral valve prolapse)    Hx of   . Osteopenia   . Peripheral edema   . Right rotator cuff tear 01/05/2013  . Sciatic pain    recurrent  . Seasonal allergies   . Stroke ([place])   . Tension headache   . Tremor    Past Surgical History:  Procedure Laterality Date  . ANTERIOR CERVICAL  DECOMP/DISCECTOMY FUSION  04/2015    Dr. Pamala Hurry, [place]  . CARDIAC CATHETERIZATION  2004   which revealed smooth and normal coronary arteries  . CARPAL TUNNEL RELEASE  2000   lt  . CATARACT EXTRACTION W/ INTRAOCULAR LENS  IMPLANT, BILATERAL  1/17   Dr. Lucita Ferrara  . COSMETIC SURGERY  2010   eyes-facial  . DENTAL SURGERY  1960  . LASIK    . LOOP RECORDER INSERTION N/A 09/22/2017   Procedure: LOOP RECORDER INSERTION;  Surgeon: Constance Haw, MD;  Location: [facility];  Service: Cardiovascular;  Laterality: N/A;  . SHOULDER ARTHROSCOPY WITH ROTATOR CUFF REPAIR  Right 01/05/2013   Procedure: SHOULDER ARTHROSCOPY WITH ARTHROSCOPIC  ROTATOR CUFF REPAIR, ACROMIOPLASTY, EXTENSIVE DEBRIDEMENT;  Surgeon: Johnny Bridge, MD;  Location: [facility];  Service: Orthopedics;  Laterality: Right;  . TEE WITHOUT CARDIOVERSION N/A 09/22/2017   Procedure: TRANSESOPHAGEAL ECHOCARDIOGRAM (TEE);  Surgeon: Sanda Klein, MD;  Location: [facility] ENDOS[facility];  Service: Cardiovascular;  Laterality: N/A;  . TONSILLECTOMY    . TUBAL LIGATION      Allergies  Allergen Reactions  . Fluticasone Other (See Comments)    DRY EYES  . Aciphex [Rabeprazole]     Critical   . Codeine Nausea Only  . Cymbalta [Duloxetine Hcl]     Critical   . Lexapro [Escitalopram]     critical  . Lipitor [Atorvastatin Calcium]     Intolerant   . Pneumovax [Pneumococcal Polysaccharide Vaccine]     moderate  . Prevacid [Lansoprazole]   . Rosuvastatin     Intolerant   . Sertraline     critical  . Statins     Critical   . Sulfa Antibiotics     vomiting  . Tetracyclines & Related     Morphine Sulfate  . Pneumovax 23 [Pneumococcal Vac Polyvalent] Rash    Injection site reaction    Allergies as of 01/29/2021      Reactions   Fluticasone Other (See Comments)   DRY EYES   Aciphex [rabeprazole]    Critical    Codeine Nausea Only   Cymbalta [duloxetine Hcl]    Critical   Lexapro [escitalopram]    critical   Lipitor [atorvastatin Calcium]    Intolerant    Pneumovax [pneumococcal Polysaccharide Vaccine]    moderate   Prevacid [lansoprazole]    Rosuvastatin    Intolerant    Sertraline    critical   Statins    Critical   Sulfa Antibiotics    vomiting   Tetracyclines & Related    Morphine Sulfate   Pneumovax 23 [pneumococcal Vac Polyvalent] Rash   Injection site reaction      Medication List       Accurate as of Jan 29, 2021  3:59 PM. If you have any questions, ask your nurse or doctor.        acetaminophen 325 MG tablet Commonly known as: TYLENOL Take 650 mg  by mouth 2 (two) times daily.   aspirin 81 MG chewable tablet Chew 81 mg by mouth daily.   CALCIUM + D PO Take 1 tablet by mouth daily.   clonazePAM 0.5 MG tablet Commonly known as: KLONOPIN Take 0.25 mg by mouth 2 (two) times daily as needed (for anxiety and shakiness).   clopidogrel 75 MG tablet Commonly known as: PLAVIX Take 1 tablet (75 mg total) by mouth daily.   donepezil 10 MG tablet Commonly known as: ARICEPT Take 10 mg by mouth at bedtime.  gabapentin 300 MG capsule Commonly known as: NEURONTIN Take 300 mg by mouth daily. What changed: Another medication with the same name was removed. Continue taking this medication, and follow the directions you see here. Changed by: Royal Hawthorn, NP   levothyroxine 100 MCG tablet Commonly known as: SYNTHROID Take 100 mcg by mouth daily before breakfast.   melatonin 5 MG Tabs Take 5 mg by mouth at bedtime as needed.   omeprazole 40 MG capsule Commonly known as: PRILOSEC Take 40 mg by mouth daily.   PRESERVISION AREDS PO Take 1 tablet by mouth 2 (two) times daily.   propranolol 40 MG tablet Commonly known as: INDERAL Take 1 tablet (40 mg total) by mouth 2 (two) times daily. Please call [phone] to schedule an appt.   rosuvastatin 5 MG tablet Commonly known as: CRESTOR Take 1 tablet (5 mg total) by mouth daily.       Review of Systems  Constitutional: Positive for activity change (resting more due to pain). Negative for appetite change, chills, diaphoresis, fatigue, fever and unexpected weight change.  HENT: Negative for congestion.   Respiratory: Negative for cough, shortness of breath and wheezing.   Cardiovascular: Negative for chest pain, palpitations and leg swelling.  Gastrointestinal: Negative for abdominal distention, abdominal pain, constipation and diarrhea.  Genitourinary: Negative for difficulty urinating and dysuria.  Musculoskeletal: Positive for back pain and gait problem. Negative for arthralgias,  joint swelling and myalgias.  Neurological: Negative for dizziness, tremors, seizures, syncope, facial asymmetry, speech difficulty, weakness, light-headedness, numbness and headaches.  Psychiatric/Behavioral: Positive for confusion. Negative for agitation and behavioral problems.    Immunization History  Administered Date(s) Administered  . Influenza, High Dose Seasonal PF 07/23/2017, 07/03/2019  . Influenza-Unspecified 05/27/2010, 08/03/2010, 07/22/2011, 07/01/2012, 06/22/2013, 07/18/2020  . Janssen (J&J) SARS-COV-2 Vaccination 11/06/2019  . Moderna Sars-Covid-2 Vaccination 08/07/2020  . Pneumococcal Conjugate-13 02/08/2015  . Pneumococcal Polysaccharide-23 05/28/2006, 10/13/2011  . Pneumococcal-Unspecified 10/17/2013  . Td 03/29/2002  . Tdap 09/27/2001, 04/25/2002, 08/18/2012  . Zoster 08/30/2006   Pertinent  Health Maintenance Due  Topic Date Due  . INFLUENZA VACCINE  04/27/2021  . DEXA SCAN  Completed  . PNA vac Low Risk Adult  Completed   Fall Risk  09/17/2020 05/28/2020 03/01/2018 11/23/2017  Falls in the past year? 0 0 No Yes  Number falls in past yr: 0 0 - 2 or more  Injury with Fall? 0 0 - No   Functional Status Survey:    Vitals:   01/29/21 1551  BP: (!) 142/76  Pulse: (!) 58  Resp: 20  Temp: (!) 97.5 F (36.4 C)  SpO2: 94%  Weight: 136 lb 9.6 oz (62 kg)  Height: 5\' 1"  (1.549 m)   Body mass index is 25.81 kg/m. Physical Exam Vitals and nursing note reviewed.  Constitutional:      General: She is not in acute distress.    Appearance: She is not diaphoretic.  HENT:     Head: Normocephalic and atraumatic.  Neck:     Vascular: No JVD.  Cardiovascular:     Rate and Rhythm: Normal rate and regular rhythm.     Pulses:          Dorsalis pedis pulses are 1+ on the right side and 1+ on the left side.     Heart sounds: No murmur heard.   Pulmonary:     Effort: Pulmonary effort is normal. No respiratory distress.     Breath sounds: Normal breath sounds. No  wheezing.  Abdominal:  General: Bowel sounds are normal. There is no distension.     Palpations: Abdomen is soft.     Tenderness: There is no abdominal tenderness.  Musculoskeletal:     Right lower leg: No edema.     Left lower leg: No edema.     Comments: Neg SLR  Skin:    General: Skin is warm and dry.  Neurological:     General: No focal deficit present.     Mental Status: She is alert. Mental status is at baseline.     Deep Tendon Reflexes:     Reflex Scores:      Patellar reflexes are 1+ on the right side and 1+ on the left side.      Achilles reflexes are 1+ on the right side and 1+ on the left side.    Labs reviewed: Recent Labs    05/30/20 0000  NA 139  K 4.2  CL 101  CO2 25*  BUN 10  CREATININE 0.7  CALCIUM 9.7   Recent Labs    05/30/20 0000  AST 17  ALT 14  ALBUMIN 4.2   No results for input(s): WBC, NEUTROABS, HGB, HCT, MCV, PLT in the last 8760 hours. Lab Results  Component Value Date   TSH 4.59 08/14/2020   Lab Results  Component Value Date   HGBA1C 5.6 09/21/2017   Lab Results  Component Value Date   CHOL 209 (A) 05/30/2020   HDL 74 (A) 05/30/2020   LDLCALC 105 05/30/2020   TRIG 151 05/30/2020   CHOLHDL 2.1 09/21/2017    Significant Diagnostic Results in last 30 days:  No results found.  Assessment/Plan  1. Spinal stenosis of lumbar region with neurogenic claudication Requested notes from office visit with Dr. Annette Stable If none will request imaging Increase neurontin to 100 mg in the am and 300 mg in the evening. After 1 week may increase to 200 mg in the am and continue 300 in the evening and monitor for excess sedation  Continue Tylenol bid and add bid prn Consider PT Consider f/u with neurosurgery F/u with me in 2 weeks  Family/ staff Communication: nurse   Labs/tests ordered:  NA

## 2021-02-09 ENCOUNTER — Encounter: Payer: Self-pay | Admitting: Internal Medicine

## 2021-02-09 DIAGNOSIS — I10 Essential (primary) hypertension: Secondary | ICD-10-CM | POA: Diagnosis not present

## 2021-02-09 DIAGNOSIS — E039 Hypothyroidism, unspecified: Secondary | ICD-10-CM | POA: Diagnosis not present

## 2021-02-09 DIAGNOSIS — I639 Cerebral infarction, unspecified: Secondary | ICD-10-CM | POA: Diagnosis not present

## 2021-02-09 LAB — CBC AND DIFFERENTIAL
HCT: 41 (ref 36–46)
Hemoglobin: 14.4 (ref 12.0–16.0)
Platelets: 272 (ref 150–399)
WBC: 4.8

## 2021-02-09 LAB — BASIC METABOLIC PANEL
BUN: 10 (ref 4–21)
CO2: 23 — AB (ref 13–22)
Chloride: 105 (ref 99–108)
Creatinine: 0.8 (ref 0.5–1.1)
Glucose: 86
Potassium: 4.3 (ref 3.4–5.3)
Sodium: 143 (ref 137–147)

## 2021-02-09 LAB — COMPREHENSIVE METABOLIC PANEL: Calcium: 9.4 (ref 8.7–10.7)

## 2021-02-09 LAB — TSH: TSH: 0.13 — AB (ref 0.41–5.90)

## 2021-02-09 LAB — CBC: RBC: 4.48 (ref 3.87–5.11)

## 2021-02-16 ENCOUNTER — Encounter: Payer: Self-pay | Admitting: Adult Health

## 2021-02-16 ENCOUNTER — Non-Acute Institutional Stay: Payer: Medicare Other | Admitting: Adult Health

## 2021-02-16 ENCOUNTER — Other Ambulatory Visit: Payer: Self-pay

## 2021-02-16 VITALS — BP 146/92 | HR 55 | Temp 97.0°F | Ht 61.0 in | Wt 138.0 lb

## 2021-02-16 DIAGNOSIS — E039 Hypothyroidism, unspecified: Secondary | ICD-10-CM | POA: Diagnosis not present

## 2021-02-16 DIAGNOSIS — M48062 Spinal stenosis, lumbar region with neurogenic claudication: Secondary | ICD-10-CM | POA: Diagnosis not present

## 2021-02-16 MED ORDER — GABAPENTIN 300 MG PO CAPS: 300.0000 mg | ORAL_CAPSULE | Freq: Two times a day (BID) | ORAL | 0 refills | Status: DC

## 2021-02-16 MED ORDER — LEVOTHYROXINE SODIUM 88 MCG PO TABS: 88.0000 ug | ORAL_TABLET | Freq: Every day | ORAL | 3 refills | Status: DC

## 2021-02-16 NOTE — Progress Notes (Signed)
Location:  Art[facility] of Service:  Clinic 12  Provider: Royal Hawthorn NP  Goals of Care:  Advanced Directives 02/16/2021  Does Patient Have a Medical Advance Directive? Yes  Type of Advance Directive Living will  Does patient want to make changes to medical advance directive? No - Patient declined  Would patient like information on creating a medical advance directive? -  Pre-existing out of facility DNR order (yellow form or pink MOST form) -       Chief Complaint  Patient presents with  . Medical Management of Chronic Issues    Patient returns to the clinic for her 2 week follow up.       HPI: Patient is a 80 y.o. female seen today for follow regarding spinal stenosis. She was seen on 5/5 for back and hip pain with burning which seemed to be relieved with lying down. She was started on neurontin. She has a hx of spinal stenosis with claudication. MRI of the lumbar spine 12/20/14 showed progression of multifactorial spinal stenosis at L4-5 and L3-4 with grade 1 anterolisthesis L5-S1.   She was followed by Dr. Annette Stable and received back injections in 2017 and 2021.  I reviewed their notes (what was available to me) and it appears they had offered her surgery in 2017 but she was not interested.     She reports for her visit today that her symptoms of low back pain and hip into the right hip have not changed with neurontin. She is ambulatory with no reduction in function noted. Denies numbness, tingling, or change in bowel or bladder habits.   Vitals:   02/16/21 1513  BP: (!) 146/92  Pulse: (!) 55  Temp: (!) 97 F (36.1 C)  SpO2: 98%    Review of Systems  Constitutional: Negative for activity change, appetite change, chills, diaphoresis, fatigue, fever and unexpected weight change.  HENT: Negative for congestion.   Respiratory: Negative for cough, shortness of breath and wheezing.   Cardiovascular: Negative for chest pain, palpitations and leg  swelling.  Gastrointestinal: Negative for abdominal distention, abdominal pain, constipation and diarrhea.  Genitourinary: Negative for difficulty urinating and dysuria.  Musculoskeletal: Positive for back pain. Negative for arthralgias, gait problem, joint swelling and myalgias.  Neurological: Negative for dizziness, tremors, seizures, syncope, facial asymmetry, speech difficulty, weakness, light-headedness, numbness and headaches.  Psychiatric/Behavioral: Positive for confusion. Negative for agitation and behavioral problems.   Physical Exam Vitals and nursing note reviewed.  Constitutional:      General: She is not in acute distress.    Appearance: She is not diaphoretic.  HENT:     Head: Normocephalic and atraumatic.  Neck:     Vascular: No JVD.  Cardiovascular:     Rate and Rhythm: Normal rate and regular rhythm.     Heart sounds: No murmur heard.   Pulmonary:     Effort: Pulmonary effort is normal. No respiratory distress.     Breath sounds: Normal breath sounds. No wheezing.  Musculoskeletal:        General: No swelling, tenderness, deformity or signs of injury.     Lumbar back: No swelling, deformity or tenderness. Decreased range of motion. Negative right straight leg raise test and negative left straight leg raise test. Scoliosis present.     Right lower leg: No edema.     Left lower leg: No edema.     Comments: BPPP+1  Skin:    General: Skin is warm and dry.  Neurological:  Mental Status: She is alert and oriented to person, place, and time.    Active Ambulatory Problems    Diagnosis Date Noted  . Essential hypertension 12/22/2011  . Hyperlipidemia 12/22/2011  . Right rotator cuff tear 01/05/2013  . 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 Biceps tendonitis 01/05/2013  . Hypothyroidism 06/09/2015  . Coronary artery calcification 02/20/2016  . Anxiety 02/27/2016  . Floater, vitreous 03/21/2012  . IBS (irritable bowel syndrome) 02/27/2016  . Macular degeneration  03/21/2012  . Paresthesia 08/01/2017  . Tremor 08/01/2017  . 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 Stroke (cerebrum) ([place]) 09/20/2017  . Orthostatic hypotension 01/19/2018   Resolved Ambulatory Problems    Diagnosis Date Noted  . Impingement syndrome of right shoulder 01/05/2013   Past Medical History:  Diagnosis Date  . Arthritis   . CTS (carpal tunnel syndrome) 1995  . Degenerative disc disease, cervical   . DJD (degenerative joint disease) 2016  . GERD (gastroesophageal reflux disease)   . HTN (hypertension)   . Imbalance   . Mass of left side of neck   . Memory loss   . Mood disorder ([place])   . MVP (mitral valve prolapse)   . Osteopenia   . Peripheral edema   . Sciatic pain   . Seasonal allergies   . Stroke ([place])   . Tension headache        Past Medical History:  Diagnosis Date  . Anxiety   . Arthritis   . Biceps tendonitis 01/05/2013  . 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 CTS (carpal tunnel syndrome) 1995  . Degenerative disc disease, cervical   . DJD (degenerative joint disease) 2016   DDD with spinal stenosis. Street of back injections by Dr. Brien Few 2016 with good relief, history of cervical DD with possiblity of surgery by Dr. Trenton Gammon.  Marland Kitchen GERD (gastroesophageal reflux disease)    IBS with moderate refluc seen on upper GI study form January 2011 Dr. Watt Climes  . HTN (hypertension)    no meds  . Hyperlipidemia   . Hypothyroidism   . IBS (irritable bowel syndrome)   . Imbalance   . Impingement syndrome of right shoulder 01/05/2013  . Mass of left side of neck   . Memory loss   . Mood disorder ([place])   . MVP (mitral valve prolapse)    Hx of   . Osteopenia   . Peripheral edema   . Right rotator cuff tear 01/05/2013  . Sciatic pain    recurrent  . Seasonal allergies   . Stroke ([place])   . Tension headache   . Tremor       Past Surgical History:  Procedure Laterality Date  . ANTERIOR CERVICAL DECOMP/DISCECTOMY FUSION  04/2015    Dr.  Pamala Hurry, [place]  . CARDIAC CATHETERIZATION  2004   which revealed smooth and normal coronary arteries  . CARPAL TUNNEL RELEASE  2000   lt  . CATARACT EXTRACTION W/ INTRAOCULAR LENS  IMPLANT, BILATERAL  1/17   Dr. Lucita Ferrara  . COSMETIC SURGERY  2010   eyes-facial  . DENTAL SURGERY  1960  . LASIK    . LOOP RECORDER INSERTION N/A 09/22/2017   Procedure: LOOP RECORDER INSERTION;  Surgeon: Constance Haw, MD;  Location: [facility];  Service: Cardiovascular;  Laterality: N/A;  . SHOULDER ARTHROSCOPY WITH ROTATOR CUFF REPAIR Right 01/05/2013   Procedure: SHOULDER ARTHROSCOPY WITH ARTHROSCOPIC  ROTATOR CUFF REPAIR, ACROMIOPLASTY, EXTENSIVE DEBRIDEMENT;  Surgeon: Johnny Bridge, MD;  [facility];  Service: Orthopedics;  Laterality: Right;  . TEE WITHOUT CARDIOVERSION N/A 09/22/2017   Procedure: TRANSESOPHAGEAL ECHOCARDIOGRAM (TEE);  Surgeon: Sanda Klein, MD;  Location: [facility] ENDOSCOPY;  Service: Cardiovascular;  Laterality: N/A;  . TONSILLECTOMY    . TUBAL LIGATION        Allergies  Allergen Reactions  . Fluticasone Other (See Comments)    DRY EYES  . Aciphex [Rabeprazole]     Critical   . Codeine Nausea Only  . Cymbalta [Duloxetine Hcl]     Critical   . Lexapro [Escitalopram]     critical  . Lipitor [Atorvastatin Calcium]     Intolerant   . Pneumovax [Pneumococcal Polysaccharide Vaccine]     moderate  . Prevacid [Lansoprazole]   . Rosuvastatin     Intolerant   . Sertraline     critical  . Statins     Critical   . Sulfa Antibiotics     vomiting  . Tetracyclines & Related     Morphine Sulfate  . Pneumovax 23 [Pneumococcal Vac Polyvalent] Rash    Injection site reaction      Outpatient Encounter Medications as of 02/16/2021  Medication Sig  . acetaminophen (TYLENOL) 325 MG tablet Take 650 mg by mouth 2 (two) times daily. And bid prn  . aspirin 81 MG chewable tablet Chew 81 mg by mouth daily.  . Calcium Carbonate-Vitamin D (CALCIUM  + D PO) Take 1 tablet by mouth daily.   . clonazePAM (KLONOPIN) 0.5 MG tablet Take 0.25 mg by mouth 2 (two) times daily as needed (for anxiety and shakiness).  . clopidogrel (PLAVIX) 75 MG tablet Take 1 tablet (75 mg total) by mouth daily.  Marland Kitchen donepezil (ARICEPT) 10 MG tablet Take 10 mg by mouth at bedtime.  . gabapentin (NEURONTIN) 300 MG capsule Take 300 mg by mouth in the morning and at bedtime.  Marland Kitchen levothyroxine (SYNTHROID) 100 MCG tablet Take 100 mcg by mouth daily before breakfast.  . melatonin 5 MG TABS Take 5 mg by mouth at bedtime as needed.  . Multiple Vitamins-Minerals (PRESERVISION AREDS PO) Take 1 tablet by mouth 2 (two) times daily.   Marland Kitchen omeprazole (PRILOSEC) 40 MG capsule Take 40 mg by mouth daily.  . propranolol (INDERAL) 40 MG tablet Take 1 tablet (40 mg total) by mouth 2 (two) times daily. Please call [phone] to schedule an appt.  . rosuvastatin (CRESTOR) 5 MG tablet Take 1 tablet (5 mg total) by mouth daily.  . [DISCONTINUED] gabapentin (NEURONTIN) 100 MG capsule Take 100 mg by mouth daily with breakfast. Give 100 mg qam x 1 week and then increase to 200 mg qam if tolerating. Monitor for sedation   No facility-administered encounter medications on file as of 02/16/2021.     Review of Systems:  Review of Systems  Constitutional: Negative for activity change, appetite change, chills, diaphoresis, fatigue, fever and unexpected weight change.  HENT: Negative for congestion.   Respiratory: Negative for cough, shortness of breath and wheezing.   Cardiovascular: Negative for chest pain, palpitations and leg swelling.  Gastrointestinal: Negative for abdominal distention, abdominal pain, constipation and diarrhea.  Genitourinary: Negative for difficulty urinating and dysuria.  Musculoskeletal: Positive for back pain. Negative for arthralgias, gait problem, joint swelling and myalgias.  Neurological: Negative for dizziness, tremors, seizures, syncope, facial asymmetry, speech  difficulty, weakness, light-headedness, numbness and headaches.  Psychiatric/Behavioral: Positive for confusion. Negative for agitation and behavioral problems.      Health Maintenance  Topic Date Due  .  INFLUENZA VACCINE  04/27/2021  . TETANUS/TDAP  08/18/2022  . DEXA SCAN  Completed  . COVID-19 Vaccine  Completed  . PNA vac Low Risk Adult  Completed  . HPV VACCINES  Aged Out     Physical Exam:  There were no vitals filed for this visit.  There is no height or weight on file to calculate BMI.  Physical Exam Vitals and nursing note reviewed.  Constitutional:      General: She is not in acute distress.    Appearance: She is not diaphoretic.  HENT:     Head: Normocephalic and atraumatic.  Neck:     Vascular: No JVD.  Cardiovascular:     Rate and Rhythm: Normal rate and regular rhythm.     Heart sounds: No murmur heard.   Pulmonary:     Effort: Pulmonary effort is normal. No respiratory distress.     Breath sounds: Normal breath sounds. No wheezing.  Musculoskeletal:        General: No swelling, tenderness, deformity or signs of injury.     Lumbar back: No swelling, deformity or tenderness. Decreased range of motion. Negative right straight leg raise test and negative left straight leg raise test. Scoliosis present.     Right lower leg: No edema.     Left lower leg: No edema.     Comments: BPPP+1  Skin:    General: Skin is warm and dry.  Neurological:     Mental Status: She is alert and oriented to person, place, and time.      Labs reviewed:  Basic Metabolic Panel:   Recent Labs    05/30/20 0000 08/14/20 0000  NA 139  --   K 4.2  --   CL 101  --   CO2 25*  --   BUN 10  --   CREATININE 0.7  --   CALCIUM 9.7  --   TSH  --  4.59    Liver Function Tests:   Recent Labs    05/30/20 0000  AST 17  ALT 14  ALBUMIN 4.2    No results for input(s): LIPASE, AMYLASE in the last 8760 hours.  No results for input(s): AMMONIA in the last 8760 hours.  CBC:  No  results for input(s): WBC, NEUTROABS, HGB, HCT, MCV, PLT in the last 8760 hours.  Lipid Panel:   Recent Labs    05/21/20 0000 05/30/20 0000  CHOL  --  209*  HDL 75* 74*  LDLCALC 86 105  TRIG  --  151     Lab Results  Component Value Date   HGBA1C 5.6 09/21/2017     Procedures since last visit:  No results found.   Assessment/Plan   1. Spinal stenosis of lumbar region with neurogenic claudication Increase neurontin F/U with Dr Annette Stable for possible injection - gabapentin (NEURONTIN) 300 MG capsule; Take 1 capsule (300 mg total) by mouth in the morning and at bedtime.  Dispense: 60 capsule; Refill: 0  2. Acquired hypothyroidism TSH 0.13 Reduce synthroid and check f/u TSH 8 weeks  - levothyroxine (SYNTHROID) 88 MCG tablet; Take 1 tablet (88 mcg total) by mouth daily.  Dispense: 90 tablet; Refill: 3  Total time 27min:  time greater than 50% of total time spent doing pt counseling and coordination of care     Labs/tests ordered: TSH 8 weeks Next appt:3 month f/u for routine purposes

## 2021-02-18 ENCOUNTER — Telehealth: Payer: Self-pay

## 2021-02-18 NOTE — Telephone Encounter (Signed)
Forwarding to Dr Curt Bears for review/advisement ILR implanted for c stroke but if not received any transmission in almost a year then may need to arrange device clinic appt for check...Marland KitchenMarland Kitchen

## 2021-02-18 NOTE — Telephone Encounter (Signed)
Patient called in stating she lost her home monitor due to a move and its been disconnected for 283 days and she received a letter which was why she called. I can order her a new one but they are on back order and no ETA on it. She is not having any symptoms or anything and she wants to know if she needs to come in the office to get checked or will she be okay with making an appointment as needed ?

## 2021-02-20 ENCOUNTER — Telehealth: Payer: Self-pay | Admitting: *Deleted

## 2021-02-20 NOTE — Telephone Encounter (Signed)
Rollene Fare, Nurse with [facility], called and stated that patient's Gabapentin was Increased on 5/5 to 200mg  in the morning and 300mg  in the evening.   On 5/23 her Gabapentin was Increased to 300mg  twice daily.   On 5/25 her Gabapentin was Increased to 300mg  three times daily. Patient is not tolerating this well. Stated that she is staying sedated.   Nurse is requesting the Gabapentin be Decreased back to 300mg  twice daily.   Please Advise.

## 2021-02-20 NOTE — Telephone Encounter (Signed)
Sure I wrote the order.

## 2021-02-20 NOTE — Telephone Encounter (Signed)
Patient scheduled for in-clinic LINQ check per WC. No home monitor and will need to interrogate for info. Scheduled for 02/26/21 @ 1230 in device clinic. Patient provided address.

## 2021-02-20 NOTE — Telephone Encounter (Signed)
Please arrange pt for in-office device check, per [place].  Since monitor has been lost with no information received in almost a year would be best to bring her to check and ensure nothing has been found,  ILR for cryptogenic stroke (does NOT need appt w/ MD).

## 2021-03-04 ENCOUNTER — Encounter: Payer: Self-pay | Admitting: Internal Medicine

## 2021-03-05 DIAGNOSIS — R030 Elevated blood-pressure reading, without diagnosis of hypertension: Secondary | ICD-10-CM | POA: Diagnosis not present

## 2021-03-05 DIAGNOSIS — M5416 Radiculopathy, lumbar region: Secondary | ICD-10-CM | POA: Diagnosis not present

## 2021-03-05 DIAGNOSIS — Z6825 Body mass index (BMI) 25.0-25.9, adult: Secondary | ICD-10-CM | POA: Diagnosis not present

## 2021-03-09 DIAGNOSIS — M25652 Stiffness of left hip, not elsewhere classified: Secondary | ICD-10-CM | POA: Diagnosis not present

## 2021-03-09 DIAGNOSIS — M4716 Other spondylosis with myelopathy, lumbar region: Secondary | ICD-10-CM | POA: Diagnosis not present

## 2021-03-09 DIAGNOSIS — F0150 Vascular dementia without behavioral disturbance: Secondary | ICD-10-CM | POA: Diagnosis not present

## 2021-03-09 DIAGNOSIS — M5459 Other low back pain: Secondary | ICD-10-CM | POA: Diagnosis not present

## 2021-03-09 DIAGNOSIS — M25651 Stiffness of right hip, not elsewhere classified: Secondary | ICD-10-CM | POA: Diagnosis not present

## 2021-03-09 DIAGNOSIS — R293 Abnormal posture: Secondary | ICD-10-CM | POA: Diagnosis not present

## 2021-03-09 DIAGNOSIS — M5416 Radiculopathy, lumbar region: Secondary | ICD-10-CM | POA: Diagnosis not present

## 2021-03-09 DIAGNOSIS — Z8673 Personal history of transient ischemic attack (TIA), and cerebral infarction without residual deficits: Secondary | ICD-10-CM | POA: Diagnosis not present

## 2021-03-10 DIAGNOSIS — M5459 Other low back pain: Secondary | ICD-10-CM | POA: Diagnosis not present

## 2021-03-10 DIAGNOSIS — R293 Abnormal posture: Secondary | ICD-10-CM | POA: Diagnosis not present

## 2021-03-10 DIAGNOSIS — M5416 Radiculopathy, lumbar region: Secondary | ICD-10-CM | POA: Diagnosis not present

## 2021-03-10 DIAGNOSIS — M25652 Stiffness of left hip, not elsewhere classified: Secondary | ICD-10-CM | POA: Diagnosis not present

## 2021-03-10 DIAGNOSIS — M4716 Other spondylosis with myelopathy, lumbar region: Secondary | ICD-10-CM | POA: Diagnosis not present

## 2021-03-10 DIAGNOSIS — M25651 Stiffness of right hip, not elsewhere classified: Secondary | ICD-10-CM | POA: Diagnosis not present

## 2021-03-10 DIAGNOSIS — Z8673 Personal history of transient ischemic attack (TIA), and cerebral infarction without residual deficits: Secondary | ICD-10-CM | POA: Diagnosis not present

## 2021-03-10 DIAGNOSIS — F0150 Vascular dementia without behavioral disturbance: Secondary | ICD-10-CM | POA: Diagnosis not present

## 2021-03-11 DIAGNOSIS — M5416 Radiculopathy, lumbar region: Secondary | ICD-10-CM | POA: Diagnosis not present

## 2021-03-11 DIAGNOSIS — M545 Low back pain, unspecified: Secondary | ICD-10-CM | POA: Diagnosis not present

## 2021-03-12 DIAGNOSIS — M25652 Stiffness of left hip, not elsewhere classified: Secondary | ICD-10-CM | POA: Diagnosis not present

## 2021-03-12 DIAGNOSIS — R293 Abnormal posture: Secondary | ICD-10-CM | POA: Diagnosis not present

## 2021-03-12 DIAGNOSIS — M25651 Stiffness of right hip, not elsewhere classified: Secondary | ICD-10-CM | POA: Diagnosis not present

## 2021-03-12 DIAGNOSIS — M5416 Radiculopathy, lumbar region: Secondary | ICD-10-CM | POA: Diagnosis not present

## 2021-03-12 DIAGNOSIS — M4716 Other spondylosis with myelopathy, lumbar region: Secondary | ICD-10-CM | POA: Diagnosis not present

## 2021-03-12 DIAGNOSIS — F0150 Vascular dementia without behavioral disturbance: Secondary | ICD-10-CM | POA: Diagnosis not present

## 2021-03-12 DIAGNOSIS — Z8673 Personal history of transient ischemic attack (TIA), and cerebral infarction without residual deficits: Secondary | ICD-10-CM | POA: Diagnosis not present

## 2021-03-12 DIAGNOSIS — M5459 Other low back pain: Secondary | ICD-10-CM | POA: Diagnosis not present

## 2021-03-18 ENCOUNTER — Encounter: Payer: Self-pay | Admitting: Internal Medicine

## 2021-03-18 ENCOUNTER — Non-Acute Institutional Stay: Payer: Medicare Other | Admitting: Internal Medicine

## 2021-03-18 ENCOUNTER — Other Ambulatory Visit: Payer: Self-pay

## 2021-03-18 VITALS — BP 154/92 | HR 54 | Temp 97.2°F | Ht 61.0 in | Wt 137.6 lb

## 2021-03-18 DIAGNOSIS — F0150 Vascular dementia without behavioral disturbance: Secondary | ICD-10-CM

## 2021-03-18 DIAGNOSIS — G250 Essential tremor: Secondary | ICD-10-CM | POA: Diagnosis not present

## 2021-03-18 DIAGNOSIS — I10 Essential (primary) hypertension: Secondary | ICD-10-CM | POA: Diagnosis not present

## 2021-03-18 DIAGNOSIS — M48062 Spinal stenosis, lumbar region with neurogenic claudication: Secondary | ICD-10-CM | POA: Diagnosis not present

## 2021-03-18 DIAGNOSIS — Z8673 Personal history of transient ischemic attack (TIA), and cerebral infarction without residual deficits: Secondary | ICD-10-CM

## 2021-03-18 DIAGNOSIS — E039 Hypothyroidism, unspecified: Secondary | ICD-10-CM

## 2021-03-18 DIAGNOSIS — E782 Mixed hyperlipidemia: Secondary | ICD-10-CM | POA: Diagnosis not present

## 2021-03-18 NOTE — Progress Notes (Signed)
Location:  [place] of Service:  Clinic (12)  Provider:   Code Status: DNR Goals of Care:  Advanced Directives 03/18/2021  Does Patient Have a Medical Advance Directive? Yes  Type of Advance Directive Living will  Does patient want to make changes to medical advance directive? No - Patient declined  Would patient like information on creating a medical advance directive? -  Pre-existing out of facility DNR order (yellow form or pink MOST form) -     Chief Complaint  Patient presents with   Medical Management of Chronic Issues    AL Patient returns to the clinic for her 6 month up.    Health Maintenance    Shingrix and #4 covid    HPI: Patient is a 80 y.o. female seen today for medical management of chronic diseases.   Patient has h/o  Vascular dementia S/p CVA in 2018 and Repeat MRI has shown Chronic Vessel Changes Essential Tremor, Anxiety, Brachial Plexux Palsy, Chronic Pain in her Left Knee, Hypothyroidism, Hyperlipidemia,   Her Acute issue right now is Lower Back pain with Radiating down to her Thigh Was recently seen by Dr Annette Stable office and was started on Medrol Pack and had MRI done. Do not have Results for that But per her nurses her pain completely resolved with Medrol Pack. She also did not tolerate Neurontin 300 mg TID due to lethargy and it was changed to BID. Patient repeats herself and keep saying my back hurts all the time. No Red Flag signs. No Weakness in her legs. No Bowel or bladder issue. Walks with no assist. 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 Street of back injections by Dr. Brien Few 2016 with good relief, history of cervical DD with possiblity of surgery by Dr. Trenton Gammon.   GERD  (gastroesophageal reflux disease)    IBS with moderate refluc seen on upper GI study form January 2011 Dr. Watt Climes   HTN (hypertension)    no meds   Hyperlipidemia    Hypothyroidism    IBS (irritable bowel syndrome)    Imbalance    Impingement syndrome of right shoulder 01/05/2013   Mass of left side of neck    Memory loss    Mood disorder ([place])    MVP (mitral valve prolapse)    Hx of    Osteopenia    Peripheral edema    Right rotator cuff tear 01/05/2013   Sciatic pain    recurrent   Seasonal allergies    Stroke [facility])    Tension headache    Tremor     Past Surgical History:  Procedure Laterality Date   ANTERIOR CERVICAL DECOMP/DISCECTOMY FUSION  04/2015    Dr. Pamala Hurry, [place]  2004   which revealed smooth and normal coronary arteries   CARPAL TUNNEL RELEASE  2000   lt   CATARACT EXTRACTION W/ INTRAOCULAR LENS  IMPLANT, BILATERAL  1/17   Dr. Lucita Ferrara   COSMETIC SURGERY  2010   eyes-facial   DENTAL SURGERY  1960   LASIK     LOOP RECORDER INSERTION N/A 09/22/2017   Procedure: LOOP RECORDER INSERTION;  Surgeon: Constance Haw, MD;  Location: [place] [facility]  [facility];  Service: Cardiovascular;  Laterality: N/A;   SHOULDER ARTHROSCOPY WITH ROTATOR CUFF REPAIR Right 01/05/2013   Procedure: SHOULDER ARTHROSCOPY WITH ARTHROSCOPIC  ROTATOR CUFF REPAIR, ACROMIOPLASTY, EXTENSIVE DEBRIDEMENT;  Surgeon: Johnny Bridge, MD;  Location: [facility];  Service: Orthopedics;  Laterality: Right;   TEE WITHOUT CARDIOVERSION N/A 09/22/2017   Procedure: TRANSESOPHAGEAL ECHOCARDIOGRAM (TEE);  Surgeon: Sanda Klein, MD;  [facility];  Service: Cardiovascular;  Laterality: N/A;   TONSILLECTOMY     TUBAL LIGATION      Allergies  Allergen Reactions   Fluticasone Other (See Comments)    DRY EYES   Aciphex [Rabeprazole]     Critical    Codeine Nausea Only   Cymbalta [Duloxetine Hcl]     Critical    Lexapro [Escitalopram]     critical    Lipitor [Atorvastatin Calcium]     Intolerant    Pneumovax [Pneumococcal Polysaccharide Vaccine]     moderate   Prevacid [Lansoprazole]    Rosuvastatin     Intolerant    Sertraline     critical   Statins     Critical    Sulfa Antibiotics     vomiting   Tetracyclines & Related     Morphine Sulfate   Pneumovax 23 [Pneumococcal Vac Polyvalent] Rash    Injection site reaction    Outpatient Encounter Medications as of 03/18/2021  Medication Sig   acetaminophen (TYLENOL) 325 MG tablet Take 650 mg by mouth 2 (two) times daily as needed. And bid prn   aspirin 81 MG chewable tablet Chew 81 mg by mouth daily.   Calcium Carbonate-Vitamin D (CALCIUM + D PO) Take 1 tablet by mouth daily.    clonazePAM (KLONOPIN) 0.5 MG tablet Take 0.25 mg by mouth 2 (two) times daily as needed (for anxiety and shakiness).   clopidogrel (PLAVIX) 75 MG tablet Take 1 tablet (75 mg total) by mouth daily.   donepezil (ARICEPT) 10 MG tablet Take 10 mg by mouth at bedtime.   gabapentin (NEURONTIN) 300 MG capsule Take 1 capsule (300 mg total) by mouth in the morning and at bedtime. (Patient taking differently: Take 300 mg by mouth. 300 mg in the evening. 200 mg in the morning. RX pending at pharmacy.)   levothyroxine (SYNTHROID) 88 MCG tablet Take 88 mcg by mouth daily before breakfast.   melatonin 5 MG TABS Take 5 mg by mouth at bedtime as needed.   Multiple Vitamins-Minerals (PRESERVISION AREDS PO) Take 1 tablet by mouth 2 (two) times daily.    omeprazole (PRILOSEC) 40 MG capsule Take 40 mg by mouth daily.   propranolol (INDERAL) 40 MG tablet Take 1 tablet (40 mg total) by mouth 2 (two) times daily. Please call [phone] to schedule an appt.   rosuvastatin (CRESTOR) 5 MG tablet Take 1 tablet (5 mg total) by mouth daily.   No facility-administered encounter medications on file as of 03/18/2021.    Review of Systems:  Review of Systems  Constitutional:  Positive for activity change.  HENT: Negative.     Respiratory: Negative.    Cardiovascular: Negative.   Gastrointestinal: Negative.   Genitourinary: Negative.   Musculoskeletal:  Positive for back pain.  Skin: Negative.   Neurological:  Negative for dizziness.  Psychiatric/Behavioral:  Positive for confusion.    Health Maintenance  Topic Date Due   Zoster Vaccines- Shingrix (1 of 2) 09/30/1959   COVID-19 Vaccine (4 - Booster) 11/07/2020   INFLUENZA VACCINE  04/27/2021   TETANUS/TDAP  08/18/2022  DEXA SCAN  Completed   PNA vac Low Risk Adult  Completed   HPV VACCINES  Aged Out    Physical Exam: Vitals:   03/18/21 1128  BP: (!) 154/92  Pulse: (!) 54  Temp: (!) 97.2 F (36.2 C)  SpO2: 97%  Weight: 137 lb 9.6 oz (62.4 kg)  Height: 5\' 1"  (1.549 m)   Body mass index is 26 kg/m. Physical Exam Constitutional:  Well-developed and well-nourished.  HENT:  Head: Normocephalic.  Mouth/Throat: Oropharynx is clear and moist.  Eyes: Pupils are equal, round, and reactive to light.  Neck: Neck supple.  Cardiovascular: Normal rate and normal heart sounds.  No murmur heard. Pulmonary/Chest: Effort normal and breath sounds normal. No respiratory distress. No wheezes. She has no rales.  Abdominal: Soft. Bowel sounds are normal. No distension. There is no tenderness. There is no rebound.  Musculoskeletal: No edema.  Lymphadenopathy: none Neurological: Gait was normal Straight Leg raising no pain Point tenderness in Lower lumbar area Good Strength in all extremities   Skin: Skin is warm and dry.  Psychiatric: Normal mood and affect. Behavior is normal. Thought content normal.   MMSE - Mini Mental State Exam 07/03/2020  Copy design (No Data)  Copy design-comments 05/21/2020 scored 27/30     Labs reviewed: Basic Metabolic Panel: Recent Labs    05/30/20 0000 08/14/20 0000 02/09/21 0000  NA 139  --  143  K 4.2  --  4.3  CL 101  --  105  CO2 25*  --  23*  BUN 10  --  10  CREATININE 0.7  --  0.8  CALCIUM 9.7  --  9.4   TSH  --  4.59 0.13*   Liver Function Tests: Recent Labs    05/30/20 0000  AST 17  ALT 14  ALBUMIN 4.2   No results for input(s): LIPASE, AMYLASE in the last 8760 hours. No results for input(s): AMMONIA in the last 8760 hours. CBC: Recent Labs    02/09/21 0000  WBC 4.8  HGB 14.4  HCT 41  PLT 272   Lipid Panel: Recent Labs    05/21/20 0000 05/30/20 0000  CHOL  --  209*  HDL 75* 74*  LDLCALC 86 105  TRIG  --  151   Lab Results  Component Value Date   HGBA1C 5.6 09/21/2017    Procedures since last visit: No results found.  Assessment/Plan Spinal stenosis of lumbar region with neurogenic claudication Patient was treated with Medrol pack few weeks ago by Neursurgery and per nurses she did wonderful Will do One dose of Medrol pack Add Tylenol 650 mg in Afternoon Also add Neurontin 100 mg in afternoon. 300 mg made her Lethargy Will Try Lidocaine Patches  She had MRI done by Dr Trenton Gammon Follow up arrange   Acquired hypothyroidism Synthyroid dose changed due to low TSH Follow up TSH pending Vascular dementia without behavioral disturbance ([place]) Last MMSE in 10/21 wwas 25/30 Will Repeat MMSE Need to add Namenda. Dont want to do it right now as adding Neurontin Essential tremor Stable on Inderal Essential hypertension Controlled on Inderal Mixed hyperlipidemia Repeat Lipid panel  H/o CVA in 2018 Discontinue Aspirin as does not need Dual Therapy anymore Continue Plavix and statin Needs Repeat Lipid for LDL   Labs/tests ordered:  Lipid Panel and TSH pending  Next appt:  05/18/2021

## 2021-03-19 DIAGNOSIS — M25652 Stiffness of left hip, not elsewhere classified: Secondary | ICD-10-CM | POA: Diagnosis not present

## 2021-03-19 DIAGNOSIS — Z8673 Personal history of transient ischemic attack (TIA), and cerebral infarction without residual deficits: Secondary | ICD-10-CM | POA: Diagnosis not present

## 2021-03-19 DIAGNOSIS — M5416 Radiculopathy, lumbar region: Secondary | ICD-10-CM | POA: Diagnosis not present

## 2021-03-19 DIAGNOSIS — M4716 Other spondylosis with myelopathy, lumbar region: Secondary | ICD-10-CM | POA: Diagnosis not present

## 2021-03-19 DIAGNOSIS — M5459 Other low back pain: Secondary | ICD-10-CM | POA: Diagnosis not present

## 2021-03-19 DIAGNOSIS — M25651 Stiffness of right hip, not elsewhere classified: Secondary | ICD-10-CM | POA: Diagnosis not present

## 2021-03-19 DIAGNOSIS — R293 Abnormal posture: Secondary | ICD-10-CM | POA: Diagnosis not present

## 2021-03-19 DIAGNOSIS — F0150 Vascular dementia without behavioral disturbance: Secondary | ICD-10-CM | POA: Diagnosis not present

## 2021-03-23 DIAGNOSIS — Z8673 Personal history of transient ischemic attack (TIA), and cerebral infarction without residual deficits: Secondary | ICD-10-CM | POA: Diagnosis not present

## 2021-03-23 DIAGNOSIS — M4716 Other spondylosis with myelopathy, lumbar region: Secondary | ICD-10-CM | POA: Diagnosis not present

## 2021-03-23 DIAGNOSIS — M25651 Stiffness of right hip, not elsewhere classified: Secondary | ICD-10-CM | POA: Diagnosis not present

## 2021-03-23 DIAGNOSIS — M25652 Stiffness of left hip, not elsewhere classified: Secondary | ICD-10-CM | POA: Diagnosis not present

## 2021-03-23 DIAGNOSIS — F0150 Vascular dementia without behavioral disturbance: Secondary | ICD-10-CM | POA: Diagnosis not present

## 2021-03-23 DIAGNOSIS — M5459 Other low back pain: Secondary | ICD-10-CM | POA: Diagnosis not present

## 2021-03-23 DIAGNOSIS — R293 Abnormal posture: Secondary | ICD-10-CM | POA: Diagnosis not present

## 2021-03-23 DIAGNOSIS — M5416 Radiculopathy, lumbar region: Secondary | ICD-10-CM | POA: Diagnosis not present

## 2021-03-31 ENCOUNTER — Other Ambulatory Visit: Payer: Self-pay

## 2021-03-31 NOTE — Telephone Encounter (Signed)
  Incoming fax received from [facility] for a 90 day prescription conversion request.  Fax indicated that research shows that adherence improvex when patients receive a 90 day supply of medication to treat their chronic conditions.   I called patient and left a message on voicemail requesting a return call to clarify  1.) If patient is in need of a refill at this time on her omeprazole 40 mg  2.) Which pharmacy would patient like to use  Awaiting return call

## 2021-04-13 ENCOUNTER — Non-Acute Institutional Stay: Payer: Medicare Other | Admitting: Adult Health

## 2021-04-13 ENCOUNTER — Encounter: Payer: Self-pay | Admitting: Adult Health

## 2021-04-13 DIAGNOSIS — M48062 Spinal stenosis, lumbar region with neurogenic claudication: Secondary | ICD-10-CM | POA: Diagnosis not present

## 2021-04-13 DIAGNOSIS — Z66 Do not resuscitate: Secondary | ICD-10-CM

## 2021-04-13 NOTE — Progress Notes (Signed)
Location:  [facility] of Service:  ALF (13) Provider:   Cindi Carbon, Bernard [phone]   Virgie Dad, MD  Patient Care Team: Virgie Dad, MD as PCP - General (Internal Medicine) Nahser, Wonda Cheng, MD as PCP - Cardiology (Cardiology) Clarene Essex, MD as Consulting Physician (Gastroenterology) Marcial Pacas, MD as Consulting Physician (Neurology) Kristeen Miss, MD as Consulting Physician (Neurosurgery) Martinique, Amy, MD as Consulting Physician (Dermatology) Megan Salon, MD as Consulting Physician (Gynecology) [place], Heather, Rodriguez [facility])  Extended Emergency Contact Information Primary Emergency Contact: Kahrs,Robert J Address: [address]          [place], [place] [address] [name] of [place] Phone: [phone] Work Phone: [phone] Mobile Phone: [phone] Relation: Spouse Secondary Emergency Contact: Quitman Livings of [place] Phone: [phone] Mobile Phone: [phone] Relation: Son  Code Status:  DNR Goals of care: Advanced Directive information Advanced Directives 03/18/2021  Does Patient Have a Medical Advance Directive? Yes  Type of Advance Directive Living will  Does patient want to make changes to medical advance directive? No - Patient declined  Would patient like information on creating a medical advance directive? -  Pre-existing out of facility DNR order (yellow form or pink MOST form) -     Chief Complaint  Patient presents with   Acute Visit    Low back pain    HPI:  Pt is a 79 y.o. female seen today for an acute visit for low back pain. She has a hx of spinal stenosis with claudication. MRI of the lumbar spine 12/20/14 showed progression of multifactorial spinal stenosis at L4-5 and L3-4 with grade 1 anterolisthesis L5-S1.  Repeat MRI June of 2022 showed no interval change. She was placed on neurontin but can't tolerate higher doses due to lethargy.  Continues tylenol. Tried lidocaine patches but they didn't help and so she started refusing. Was supposed to f/u with Dr Annette Stable about back injections and MRIs results but she missed the apt.  She reports burning in both hips and low back area. Worse with walking and standing. No numbness or tingling. No change in B/B habits Continues to walker her dog frequently. 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 Street of back injections by Dr. Brien Few 2016 with good relief, history of cervical DD with possiblity of surgery by Dr. Trenton Gammon.   GERD (gastroesophageal reflux disease)    IBS with moderate refluc seen on upper GI study form January 2011 Dr. Watt Climes   HTN (hypertension)    no meds   Hyperlipidemia    Hypothyroidism    IBS (irritable bowel syndrome)    Imbalance    Impingement syndrome of right shoulder 01/05/2013   Mass of left side of neck    Memory loss    Mood disorder ([facility])    MVP (mitral valve prolapse)    Hx of    Osteopenia    Peripheral edema    Right rotator cuff tear 01/05/2013   Sciatic pain    recurrent   Seasonal allergies    Stroke ([facility])    Tension headache  Tremor    Past Surgical History:  Procedure Laterality Date   ANTERIOR CERVICAL DECOMP/DISCECTOMY FUSION  04/2015    Dr. Pamala Hurry, [place]  2004   which revealed smooth and normal coronary arteries   CARPAL TUNNEL RELEASE  2000   lt   CATARACT EXTRACTION W/ INTRAOCULAR LENS  IMPLANT, BILATERAL  1/17   Dr. Lucita Ferrara   COSMETIC SURGERY  2010   eyes-facial   DENTAL SURGERY  1960   LASIK     LOOP RECORDER INSERTION N/A 09/22/2017   Procedure: LOOP RECORDER INSERTION;  Surgeon: Constance Haw,  MD;  Location: [facility];  Service: Cardiovascular;  Laterality: N/A;   SHOULDER ARTHROSCOPY WITH ROTATOR CUFF REPAIR Right 01/05/2013   Procedure: SHOULDER ARTHROSCOPY WITH ARTHROSCOPIC  ROTATOR CUFF REPAIR, ACROMIOPLASTY, EXTENSIVE DEBRIDEMENT;  Surgeon: Johnny Bridge, MD;  Location: [facility];  Service: Orthopedics;  Laterality: Right;   TEE WITHOUT CARDIOVERSION N/A 09/22/2017   Procedure: TRANSESOPHAGEAL ECHOCARDIOGRAM (TEE);  Surgeon: Sanda Klein, MD;  [facility];  Service: Cardiovascular;  Laterality: N/A;   TONSILLECTOMY     TUBAL LIGATION      Allergies  Allergen Reactions   Fluticasone Other (See Comments)    DRY EYES   Aciphex [Rabeprazole]     Critical    Codeine Nausea Only   Cymbalta [Duloxetine Hcl]     Critical    Lexapro [Escitalopram]     critical   Lipitor [Atorvastatin Calcium]     Intolerant    Pneumovax [Pneumococcal Polysaccharide Vaccine]     moderate   Prevacid [Lansoprazole]    Rosuvastatin     Intolerant    Sertraline     critical   Statins     Critical    Sulfa Antibiotics     vomiting   Tetracyclines & Related     Morphine Sulfate   Pneumovax 23 [Pneumococcal Vac Polyvalent] Rash    Injection site reaction    Outpatient Encounter Medications as of 04/13/2021  Medication Sig   acetaminophen (TYLENOL) 325 MG tablet Take 650 mg by mouth in the morning, at noon, and at bedtime.   clonazePAM (KLONOPIN) 0.5 MG tablet Take 0.25 mg by mouth 2 (two) times daily as needed (for anxiety and shakiness).   clopidogrel (PLAVIX) 75 MG tablet Take 1 tablet (75 mg total) by mouth daily.   donepezil (ARICEPT) 10 MG tablet Take 10 mg by mouth at bedtime.   gabapentin (NEURONTIN) 100 MG capsule Take 100 mg by mouth daily. In afternoon   gabapentin (NEURONTIN) 300 MG capsule Take 1 capsule (300 mg total) by mouth in the morning and at bedtime. (Patient taking differently: Take 300 mg by mouth 2 (two) times daily.)    levothyroxine (SYNTHROID) 88 MCG tablet Take 88 mcg by mouth daily before breakfast.   melatonin 5 MG TABS Take 5 mg by mouth at bedtime as needed.   Multiple Vitamins-Minerals (PRESERVISION AREDS PO) Take 1 tablet by mouth 2 (two) times daily.    omeprazole (PRILOSEC) 40 MG capsule Take 40 mg by mouth daily.   propranolol (INDERAL) 40 MG tablet Take 1 tablet (40 mg total) by mouth 2 (two) times daily. Please call [phone] to schedule an appt.   rosuvastatin (CRESTOR) 5 MG tablet Take 1 tablet (5 mg total) by mouth daily.   Calcium Carbonate-Vitamin D (CALCIUM + D PO) Take 1 tablet by mouth daily.    No facility-administered encounter medications on file as of  04/13/2021.    Review of Systems  Constitutional:  Negative for activity change, appetite change, chills, diaphoresis, fatigue, fever and unexpected weight change.  Musculoskeletal:  Positive for back pain and gait problem. Negative for joint swelling, myalgias, neck pain and neck stiffness.  Skin:  Negative for color change, pallor, rash and wound.  Neurological:  Negative for tremors and numbness.   Immunization History  Administered Date(s) Administered   Influenza, High Dose Seasonal PF 07/23/2017, 07/03/2019   Influenza-Unspecified 05/27/2010, 08/03/2010, 07/22/2011, 07/01/2012, 06/22/2013, 07/18/2020   Janssen (J&J) SARS-COV-2 Vaccination 11/06/2019   Moderna Sars-Covid-2 Vaccination 08/07/2020   PFIZER(Purple Top)SARS-COV-2 Vaccination 10/09/2019   Pneumococcal Conjugate-13 02/08/2015   Pneumococcal Polysaccharide-23 05/28/2006, 10/13/2011   Pneumococcal-Unspecified 10/17/2013   Td 03/29/2002   Tdap 09/27/2001, 04/25/2002, 08/18/2012   Zoster, Live 08/30/2006   Zoster, Unspecified 08/30/2006   Pertinent  Health Maintenance Due  Topic Date Due   INFLUENZA VACCINE  04/27/2021   DEXA SCAN  Completed   PNA vac Low Risk Adult  Completed   Fall Risk  03/18/2021 02/16/2021 09/17/2020 05/28/2020 03/01/2018  Falls in the past  year? 0 0 0 0 No  Number falls in past yr: 0 0 0 0 -  Injury with Fall? - - 0 0 -   Functional Status Survey:    Vitals:   04/13/21 1135  BP: 132/79  Temp: (!) 97.3 F (36.3 C)   There is no height or weight on file to calculate BMI. Physical Exam Constitutional:      Appearance: Normal appearance.  Musculoskeletal:        General: No swelling, tenderness, deformity or signs of injury.     Right lower leg: No edema.     Left lower leg: No edema.     Comments: BLE strength 5/5 BUE strength 5/5 Neg SLR  Neurological:     General: No focal deficit present.     Mental Status: She is alert. Mental status is at baseline.  Psychiatric:        Mood and Affect: Mood normal.    Labs reviewed: Recent Labs    05/30/20 0000 02/09/21 0000  NA 139 143  K 4.2 4.3  CL 101 105  CO2 25* 23*  BUN 10 10  CREATININE 0.7 0.8  CALCIUM 9.7 9.4   Recent Labs    05/30/20 0000  AST 17  ALT 14  ALBUMIN 4.2   Recent Labs    02/09/21 0000  WBC 4.8  HGB 14.4  HCT 41  PLT 272   Lab Results  Component Value Date   TSH 0.13 (A) 02/09/2021   Lab Results  Component Value Date   HGBA1C 5.6 09/21/2017   Lab Results  Component Value Date   CHOL 209 (A) 05/30/2020   HDL 74 (A) 05/30/2020   LDLCALC 105 05/30/2020   TRIG 151 05/30/2020   CHOLHDL 2.1 09/21/2017    Significant Diagnostic Results in last 30 days:  No results found.  Assessment/Plan  1. Spinal stenosis of lumbar region with neurogenic claudication Showed improvement after two rounds of medrol.  Not really a good candidate for NSAIDS due to hx of CVA and plavix use Recommend continuing the tylenol and neurontin (can't tolerate higher doses)  D/C lidocaine patch due to pt refusal F/U with Dr. Trenton Gammon for back injections Would recommend less frequent walks and using a walker if PT felt it was beneficial   Family/ staff Communication: discussed with the patient, her husband Herbie [place] and the nurse  Tabitha  Labs/tests ordered:  NA

## 2021-04-14 DIAGNOSIS — E785 Hyperlipidemia, unspecified: Secondary | ICD-10-CM | POA: Diagnosis not present

## 2021-04-14 DIAGNOSIS — E039 Hypothyroidism, unspecified: Secondary | ICD-10-CM | POA: Diagnosis not present

## 2021-04-14 LAB — LIPID PANEL
Cholesterol: 191 (ref 0–200)
HDL: 61 (ref 35–70)
LDL Cholesterol: 99
LDl/HDL Ratio: 3.1
Triglycerides: 156 (ref 40–160)

## 2021-04-14 LAB — TSH: TSH: 0.94 (ref 0.41–5.90)

## 2021-04-15 DIAGNOSIS — Z6826 Body mass index (BMI) 26.0-26.9, adult: Secondary | ICD-10-CM | POA: Diagnosis not present

## 2021-04-15 DIAGNOSIS — M5416 Radiculopathy, lumbar region: Secondary | ICD-10-CM | POA: Diagnosis not present

## 2021-04-15 DIAGNOSIS — R030 Elevated blood-pressure reading, without diagnosis of hypertension: Secondary | ICD-10-CM | POA: Diagnosis not present

## 2021-04-29 DIAGNOSIS — M5416 Radiculopathy, lumbar region: Secondary | ICD-10-CM | POA: Diagnosis not present

## 2021-05-18 ENCOUNTER — Encounter: Payer: Self-pay | Admitting: Adult Health

## 2021-05-18 ENCOUNTER — Non-Acute Institutional Stay: Payer: Medicare Other | Admitting: Adult Health

## 2021-05-18 ENCOUNTER — Other Ambulatory Visit: Payer: Self-pay

## 2021-05-18 DIAGNOSIS — R251 Tremor, unspecified: Secondary | ICD-10-CM

## 2021-05-18 DIAGNOSIS — F419 Anxiety disorder, unspecified: Secondary | ICD-10-CM | POA: Diagnosis not present

## 2021-05-18 DIAGNOSIS — E782 Mixed hyperlipidemia: Secondary | ICD-10-CM

## 2021-05-18 DIAGNOSIS — I10 Essential (primary) hypertension: Secondary | ICD-10-CM | POA: Diagnosis not present

## 2021-05-18 DIAGNOSIS — E039 Hypothyroidism, unspecified: Secondary | ICD-10-CM | POA: Diagnosis not present

## 2021-05-18 DIAGNOSIS — F0154 Vascular dementia, unspecified severity, with anxiety: Secondary | ICD-10-CM | POA: Insufficient documentation

## 2021-05-18 DIAGNOSIS — K589 Irritable bowel syndrome without diarrhea: Secondary | ICD-10-CM | POA: Diagnosis not present

## 2021-05-18 DIAGNOSIS — F0150 Vascular dementia without behavioral disturbance: Secondary | ICD-10-CM | POA: Diagnosis not present

## 2021-05-18 DIAGNOSIS — M48062 Spinal stenosis, lumbar region with neurogenic claudication: Secondary | ICD-10-CM

## 2021-05-18 NOTE — Progress Notes (Addendum)
Location: Personal assistant  POS: clinic  Provider:  Cindi Carbon, [place] [phone]   Code Status: DNR Goals of Care:  Advanced Directives 05/18/2021  Does Patient Have a Medical Advance Directive? Yes  Type of Advance Directive Out of facility DNR (pink MOST or yellow form);Living will  Does patient want to make changes to medical advance directive? No - Patient declined  Would patient like information on creating a medical advance directive? -  Pre-existing out of facility DNR order (yellow form or pink MOST form) Yellow form placed in chart (order not valid for inpatient use)     Chief Complaint  Patient presents with   Medical Management of Chronic Issues    Patient returns to the clinic for er 3 month follow up.    Quality Metric Gaps    Shingrix, #4 covid and flu vaccine    HPI: Patient is a 80 y.o. female seen today for medical management of chronic diseases.    Had lumbar injection for back pain by Dr. Trenton Gammon 04/29/21. She says it did not solve the problem but her pain has improved. She was having low back and hip pain and now this has receded to a degree. She continues on neurontin but does not remember taking it and she can tell if it helps.   BP is controlled  LDL 99  Hypthyroidism Lab Results  Component Value Date   TSH 0.94 04/14/2021   Dementia: MMSE 24/30 05/16/21, very short term memory loss and needs cuing and reminders.      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 Street of back injections by Dr. Brien Few 2016 with good relief, history of cervical DD with possiblity of surgery by Dr. Trenton Gammon.   GERD (gastroesophageal reflux disease)    IBS with moderate refluc seen on upper GI study  form January 2011 Dr. Watt Climes   HTN (hypertension)    no meds   Hyperlipidemia    Hypothyroidism    IBS (irritable bowel syndrome)    Imbalance    Impingement syndrome of right shoulder 01/05/2013   Mass of left side of neck    Memory loss    Mood disorder ([place])    MVP (mitral valve prolapse)    Hx of    Osteopenia    Peripheral edema    Right rotator cuff tear 01/05/2013   Sciatic pain    recurrent   Seasonal allergies    Stroke [facility]    Tension headache    Tremor     Past Surgical History:  Procedure Laterality Date   ANTERIOR CERVICAL DECOMP/DISCECTOMY FUSION  04/2015    Dr. Pamala Hurry, [place]  2004   which revealed smooth and normal coronary arteries   CARPAL TUNNEL RELEASE  2000   lt   CATARACT EXTRACTION W/ INTRAOCULAR LENS  IMPLANT, BILATERAL  1/17   Dr. Lucita Ferrara   COSMETIC SURGERY  2010   eyes-facial   DENTAL SURGERY  1960   LASIK     LOOP RECORDER INSERTION N/A 09/22/2017   Procedure: LOOP RECORDER INSERTION;  Surgeon: Constance Haw, MD;  Location: [facility];  Service: Cardiovascular;  Laterality: N/A;   SHOULDER ARTHROSCOPY  WITH ROTATOR CUFF REPAIR Right 01/05/2013   Procedure: SHOULDER ARTHROSCOPY WITH ARTHROSCOPIC  ROTATOR CUFF REPAIR, ACROMIOPLASTY, EXTENSIVE DEBRIDEMENT;  Surgeon: Johnny Bridge, MD;  Location: [facility];  Service: Orthopedics;  Laterality: Right;   TEE WITHOUT CARDIOVERSION N/A 09/22/2017   Procedure: TRANSESOPHAGEAL ECHOCARDIOGRAM (TEE);  Surgeon: Sanda Klein, MD;  Location: [facility];  Service: Cardiovascular;  Laterality: N/A;   TONSILLECTOMY     TUBAL LIGATION      Allergies  Allergen Reactions   Fluticasone Other (See Comments)    DRY EYES   Aciphex [Rabeprazole]     Critical    Codeine Nausea Only   Cymbalta [Duloxetine Hcl]     Critical    Lexapro [Escitalopram]     critical   Lipitor [Atorvastatin Calcium]     Intolerant    Pneumovax [Pneumococcal  Polysaccharide Vaccine]     moderate   Prevacid [Lansoprazole]    Rosuvastatin     Intolerant    Sertraline     critical   Statins     Critical    Sulfa Antibiotics     vomiting   Tetracyclines & Related     Morphine Sulfate   Pneumovax 23 [Pneumococcal Vac Polyvalent] Rash    Injection site reaction    Outpatient Encounter Medications as of 05/18/2021  Medication Sig   acetaminophen (TYLENOL) 325 MG tablet Take 650 mg by mouth in the morning, at noon, and at bedtime.   aspirin EC 81 MG tablet Take 81 mg by mouth daily. Swallow whole.   Calcium Carbonate 500 (200 Ca) MG WAFR Take by mouth 2 (two) times daily as needed.   Calcium Carbonate-Vitamin D (CALCIUM + D PO) Take 1 tablet by mouth daily.    clonazePAM (KLONOPIN) 0.5 MG tablet Take 0.25 mg by mouth 2 (two) times daily as needed (for anxiety and shakiness).   clopidogrel (PLAVIX) 75 MG tablet Take 1 tablet (75 mg total) by mouth daily.   donepezil (ARICEPT) 10 MG tablet Take 10 mg by mouth at bedtime.   gabapentin (NEURONTIN) 100 MG capsule Take 100 mg by mouth daily. In afternoon   gabapentin (NEURONTIN) 300 MG capsule Take 1 capsule (300 mg total) by mouth in the morning and at bedtime.   levothyroxine (SYNTHROID) 88 MCG tablet Take 88 mcg by mouth daily before breakfast.   melatonin 5 MG TABS Take 5 mg by mouth at bedtime as needed.   Multiple Vitamins-Minerals (PRESERVISION AREDS PO) Take 1 tablet by mouth 2 (two) times daily.    omeprazole (PRILOSEC) 40 MG capsule Take 40 mg by mouth daily.   propranolol (INDERAL) 40 MG tablet Take 1 tablet (40 mg total) by mouth 2 (two) times daily. Please call [phone] to schedule an appt.   rosuvastatin (CRESTOR) 5 MG tablet Take 1 tablet (5 mg total) by mouth daily.   No facility-administered encounter medications on file as of 05/18/2021.    Review of Systems:  Review of Systems  Constitutional:  Negative for activity change, appetite change, chills, diaphoresis, fatigue, fever  and unexpected weight change.  HENT:  Negative for congestion.   Respiratory:  Negative for cough, shortness of breath and wheezing.   Cardiovascular:  Negative for chest pain, palpitations and leg swelling.  Gastrointestinal:  Negative for abdominal distention, abdominal pain, constipation and diarrhea.  Genitourinary:  Negative for difficulty urinating and dysuria.  Musculoskeletal:  Positive for back pain and gait problem. Negative for arthralgias, joint swelling and myalgias.  Neurological:  Negative  for dizziness, tremors, seizures, syncope, facial asymmetry, speech difficulty, weakness, light-headedness, numbness and headaches.  Psychiatric/Behavioral:  Positive for confusion. Negative for agitation and behavioral problems.    Health Maintenance  Topic Date Due   Zoster Vaccines- Shingrix (1 of 2) 09/30/1959   COVID-19 Vaccine (4 - Booster) 11/07/2020   INFLUENZA VACCINE  04/27/2021   TETANUS/TDAP  08/18/2022   DEXA SCAN  Completed   PNA vac Low Risk Adult  Completed   HPV VACCINES  Aged Out    Physical Exam: There were no vitals filed for this visit. There is no height or weight on file to calculate BMI. Physical Exam Vitals and nursing note reviewed.  Constitutional:      General: She is not in acute distress.    Appearance: She is not diaphoretic.  HENT:     Head: Normocephalic and atraumatic.     Right Ear: There is impacted cerumen.     Left Ear: There is impacted cerumen.     Nose: Nose normal. No congestion.     Mouth/Throat:     Mouth: Mucous membranes are moist.     Pharynx: Oropharynx is clear. No oropharyngeal exudate.  Eyes:     Conjunctiva/sclera: Conjunctivae normal.     Pupils: Pupils are equal, round, and reactive to light.  Neck:     Vascular: No JVD.  Cardiovascular:     Rate and Rhythm: Normal rate and regular rhythm.     Heart sounds: No murmur heard. Pulmonary:     Effort: Pulmonary effort is normal. No respiratory distress.     Breath sounds:  Normal breath sounds. No wheezing.  Abdominal:     General: Bowel sounds are normal. There is no distension.     Palpations: Abdomen is soft.     Tenderness: There is no abdominal tenderness.  Musculoskeletal:     Right lower leg: No edema.     Left lower leg: No edema.     Comments: Strength 4/5 BLE  Skin:    General: Skin is warm and dry.  Neurological:     General: No focal deficit present.     Mental Status: She is alert. Mental status is at baseline.     Deep Tendon Reflexes:     Reflex Scores:      Patellar reflexes are 2+ on the right side and 2+ on the left side. Psychiatric:        Mood and Affect: Mood normal.    Labs reviewed: Basic Metabolic Panel: Recent Labs    05/30/20 0000 08/14/20 0000 02/09/21 0000 04/14/21 0000  NA 139  --  143  --   K 4.2  --  4.3  --   CL 101  --  105  --   CO2 25*  --  23*  --   BUN 10  --  10  --   CREATININE 0.7  --  0.8  --   CALCIUM 9.7  --  9.4  --   TSH  --  4.59 0.13* 0.94   Liver Function Tests: Recent Labs    05/30/20 0000  AST 17  ALT 14  ALBUMIN 4.2   No results for input(s): LIPASE, AMYLASE in the last 8760 hours. No results for input(s): AMMONIA in the last 8760 hours. CBC: Recent Labs    02/09/21 0000  WBC 4.8  HGB 14.4  HCT 41  PLT 272   Lipid Panel: Recent Labs    05/21/20 0000 05/30/20 0000 04/14/21  0000  CHOL  --  209* 191  HDL 75* 74* 61  LDLCALC 86 105 99  TRIG  --  151 156   Lab Results  Component Value Date   HGBA1C 5.6 09/21/2017    Procedures since last visit: No results found.  Assessment/Plan  1. Vascular dementia without behavioral disturbance ([place]) Continues with progressive short term memory loss Assisted living level of care Continue Aricept, consider Namenda in the future   2. Acquired hypothyroidism Continue synthroid   3. Irritable bowel syndrome without diarrhea No current symptoms   4. Mixed hyperlipidemia At goal  Continue crestor   5.  Tremor Continues on propranolol with no s/e  Tremor not noted today   6. Anxiety Continue clonazepam if needed   7. Essential hypertension Controlled   8. Spinal stenosis of lumbar region with neurogenic claudication Improved but no resolved Will not taper neurontin at this time due to pain an dburning Followed by Dr. Trenton Gammon  Labs/tests ordered:  * No order type specified * BMP prior to next apt Next appt:  4 months with Dr Darnell Level   Total time 17mn:  time greater than 50% of total time spent doing pt counseling and coordination of care   Shingrix ordered if ok with POA

## 2021-05-21 DIAGNOSIS — M5416 Radiculopathy, lumbar region: Secondary | ICD-10-CM | POA: Diagnosis not present

## 2021-05-21 DIAGNOSIS — R030 Elevated blood-pressure reading, without diagnosis of hypertension: Secondary | ICD-10-CM | POA: Diagnosis not present

## 2021-05-21 DIAGNOSIS — Z6826 Body mass index (BMI) 26.0-26.9, adult: Secondary | ICD-10-CM | POA: Diagnosis not present

## 2021-05-27 ENCOUNTER — Other Ambulatory Visit: Payer: Self-pay | Admitting: Neurosurgery

## 2021-05-29 ENCOUNTER — Telehealth: Payer: Self-pay | Admitting: *Deleted

## 2021-05-29 NOTE — Telephone Encounter (Signed)
   [facility] Pre-operative Risk Assessment    Patient Name: Bianca Garcia  DOB: [birth year] MRN: [id number]  HEARTCARE STAFF:  - IMPORTANT!!!!!! Under Visit Info/Reason for Call, type in Other and utilize the format Clearance MM/DD/YY or Clearance TBD. Do not use dashes or single digits. - Please review there is not already an duplicate clearance open for this procedure. - If request is for dental extraction, please clarify the # of teeth to be extracted. - If the patient is currently at the dentist's office, call Pre-Op Callback Staff (MA/nurse) to input urgent request.  - If the patient is not currently in the dentist office, please route to the Pre-Op pool.  Request for surgical clearance:  What type of surgery is being performed? L4-5 LAMINECTOMY   When is this surgery scheduled? 06/08/21  What type of clearance is required (medical clearance vs. Pharmacy clearance to hold med vs. Both)? MEDICAL  Are there any medications that need to be held prior to surgery and how long?  ASA AND PLAVIX  Practice name and name of physician performing surgery? [facility]; DR. Mallie Mussel POOL  What is the office phone number? [phone]   7.   What is the office fax number? [phone] ATTN: VANESSA X [phone]  8.   Anesthesia type (None, local, MAC, general) ? GENERAL   Julaine Hua 05/29/2021, 4:43 PM  _________________________________________________________________   (provider comments below)

## 2021-06-02 NOTE — Telephone Encounter (Signed)
   Name: Bianca Garcia  DOB: 04-18-1941  MRN: [id number]  Primary Cardiologist: [name], MD  Chart reviewed as part of pre-operative protocol coverage. Because of [name]'s past medical history and time since last visit, she will require a follow-up visit in order to better assess preoperative cardiovascular risk.  Pt has not been see in 1 year.   Pt is on plavix for stroke. Will defer to neurology/PCP for plavix hold.   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   Pre-op covering staff: - Please schedule appointment and call patient to inform them. If patient already had an upcoming appointment within acceptable timeframe, please add "pre-op clearance" to the appointment notes so provider is aware. - Please contact requesting surgeon's office via preferred method (i.e, phone, fax) to inform them of need for appointment prior to surgery.  If applicable, this message will also be routed to pharmacy pool and/or primary cardiologist for input on holding anticoagulant/antiplatelet agent as requested below so that this information is available to the clearing provider at time of patient's appointment.   [place], PA  06/02/2021, 10:31 AM

## 2021-06-02 NOTE — Telephone Encounter (Signed)
Left detailed message for the pt that she is going to need an appt for pre op clearance. I left message that I have tentatively will hold appt with Dr. Acie Fredrickson 06/03/21 @ 9 am for pre op clearance, though I need her to call the office ASAP to either confirm the appt or change appt. Did leave message that the schedules are tight and not sure if we would be able to get her in before her procedure 06/08/21.

## 2021-06-03 NOTE — Telephone Encounter (Signed)
Left message x 2 for pt to call back and schedule an appt for pre op clearance. Pt is scheduled for her procedure 06/08/21. Pt can be seen by Dr. Acie Fredrickson or APP either at [facility] [place] or [place] location.

## 2021-06-04 DIAGNOSIS — M5030 Other cervical disc degeneration, unspecified cervical region: Secondary | ICD-10-CM | POA: Diagnosis not present

## 2021-06-04 LAB — CBC AND DIFFERENTIAL
HCT: 41 (ref 36–46)
Hemoglobin: 14.3 (ref 12.0–16.0)
Platelets: 306 (ref 150–399)
WBC: 5.6

## 2021-06-04 LAB — CBC: RBC: 4.48 (ref 3.87–5.11)

## 2021-06-04 LAB — BASIC METABOLIC PANEL
BUN: 11 (ref 4–21)
CO2: 26 — AB (ref 13–22)
Chloride: 102 (ref 99–108)
Creatinine: 0.7 (ref 0.5–1.1)
Glucose: 100
Potassium: 4.1 (ref 3.4–5.3)
Sodium: 141 (ref 137–147)

## 2021-06-04 LAB — COMPREHENSIVE METABOLIC PANEL: Calcium: 9.8 (ref 8.7–10.7)

## 2021-06-04 NOTE — Telephone Encounter (Signed)
Pt is scheduled to see Dr. Acie Fredrickson tomorrow for preop clearance. Pt aware of appointment.

## 2021-06-04 NOTE — Telephone Encounter (Signed)
3rd attempt to reach pt to schedule appt for clearance. No answer, Lvm. Pt is scheduled for surgery on 9/12 if pt calls back today, Dr. Acie Fredrickson has 24 hr slots on his DOD schedule for tomorrow that we can offer patient if they are still available when she calls back.

## 2021-06-05 ENCOUNTER — Other Ambulatory Visit: Payer: Self-pay

## 2021-06-05 ENCOUNTER — Other Ambulatory Visit (HOSPITAL_COMMUNITY)
Admission: RE | Admit: 2021-06-05 | Discharge: 2021-06-05 | Disposition: A | Discharge status: Home / Self Care | Payer: Medicare Other | Source: Ambulatory Visit | Attending: Neurosurgery | Admitting: Neurosurgery

## 2021-06-05 ENCOUNTER — Ambulatory Visit (INDEPENDENT_AMBULATORY_CARE_PROVIDER_SITE_OTHER): Payer: Medicare Other | Admitting: Cardiovascular Disease

## 2021-06-05 ENCOUNTER — Encounter (HOSPITAL_COMMUNITY): Payer: Self-pay | Admitting: Neurosurgery

## 2021-06-05 ENCOUNTER — Encounter: Payer: Self-pay | Admitting: Cardiovascular Disease

## 2021-06-05 VITALS — BP 138/80 | HR 52 | Ht 61.0 in | Wt 140.0 lb

## 2021-06-05 DIAGNOSIS — Z01812 Encounter for preprocedural laboratory examination: Secondary | ICD-10-CM | POA: Insufficient documentation

## 2021-06-05 DIAGNOSIS — Z01818 Encounter for other preprocedural examination: Secondary | ICD-10-CM | POA: Diagnosis not present

## 2021-06-05 DIAGNOSIS — Z20822 Contact with and (suspected) exposure to covid-19: Secondary | ICD-10-CM | POA: Insufficient documentation

## 2021-06-05 LAB — SARS CORONAVIRUS 2 (TAT 6-24 HRS): SARS Coronavirus 2: NEGATIVE

## 2021-06-05 NOTE — Patient Instructions (Addendum)
Cleared for upcoming surgery   Medication Instructions:  No changes *If you need a refill on your cardiac medications before your next appointment, please call your pharmacy*   Lab Work: none If you have labs (blood work) drawn today and your tests are completely normal, you will receive your results only by: [place] (if you have MyChart) OR A paper copy in the mail If you have any lab test that is abnormal or we need to change your treatment, we will call you to review the results.   Testing/Procedures: none   Follow-Up: At [facility], you and your health needs are our priority.  As part of our continuing mission to provide you with exceptional heart care, we have created designated Provider Care Teams.  These Care Teams include your primary Cardiologist (physician) and Advanced Practice Providers (APPs -  Physician Assistants and Nurse Practitioners) who all work together to provide you with the care you need, when you need it.   Your next appointment:   12 month(s)  The format for your next appointment:   In Person  Provider:   You may see [name], MD or one of the following Advanced Practice Providers on your designated Care Team:   Richardson Dopp, PA-C [place], [place]

## 2021-06-05 NOTE — Pre-Procedure Instructions (Signed)
    PHEDRA ZELENKA  06/05/2021    Mrs. Alligood is scheduled for surgery on 06/08/21. Arrival time to [facility] is 9:45 AM.  Please hold her Melatonin and Vitamins as of today prior to her surgery. Patients needs to be NPO (nothing by mouth) after midnight Sunday, 06/07/21.   Day of surgery these are the medications that she can take with a small amount of water: Gabapentin (Neurontin), Levothyroxine (Synthroid), Omeprazole (Prilosec), Propanolol (Inderal) and Rosuvastatin (Crestor).  This is per Dr. Smith Robert, Anesthesiologist/Taesean Reth Stan [place], RN

## 2021-06-05 NOTE — Progress Notes (Signed)
Bianca Garcia Date of Birth  1941-07-16       [address]    [place]     [place],   [address]      Problem list: 1. History of chest pain-normal coronary artery heart catheterization Had coronary artery calcifications by CT scan   2. Hyperlipidemia 3. Hypertension 4. Hypothyroidism 5. CVA     Pt is doing well from a cardiac standpoint.  She continues to have anxiety.  She takes Klonipin on occasion which helps.  She has not had any cardiac problems.  Feb 20, 2016:  Bianca Garcia was seen by [place] of abdominal issues.   Got a CT scan  Was noted to incidentally found to have right coronary artery calcifications.   Does have some mild chest pressure like sensation with working outside, especially if on an incline.  Records from [place] were reviewed.   Chol = 247 Trigs = 89 HDL = 71 LDL 158.   Is having lots of dizziness with the Buspar.    Oct. 18, 2017: She is doing well Seems to be tolerating the amlodipine  No longer has this vague chest tightness. ( ? Esophageal spasm )   January 20, 2017:  Bianca Garcia is seen today for follow-up visit. BP is doing well.  Still has some CP ,  myoview was negative in 2017.   Has had a normal cath in the past.    Saw her primary MD - tried Sertraline . She is feeling better after stopping the Atorvastatin  Doing well on the amlodipine   Oct. 25, 2018 Continues to have frequent episodes of CP  Seems to be associated frequently with anxiety . Is having some CP right now Will last for a few minuets Constant ache  No worsened with exercise or walking   Was not able to tolerate the atrovastatin   January 19, 2018: Bianca Garcia is seen today for follow-up of her hypertension, hyperlipidemia and chest pains.  She was admitted on December 25 with a stroke.  She received TPA with good functional recovery.  Transesophageal echocardiogram was unremarkable.  She has an implantable loop  recorder. Echocardiogram during that admission reveals normal left ventricular systolic function with EF of 55 to 60%.  She has grade 2 diastolic dysfunction.  She has mild pulmonary artery hypertension with estimated PA pressure of 36 mmHg.  She presented to the emergency room again on December 21, 2017 with some dizziness and left facial numbness.  Her symptoms resolved spontaneously.  MRI of the brain was negative for an acute infarction.  The patient was discharged in stable condition.  She continues to fall on occasion .    Falling episodes are much worse since the CVA .   Symptoms seem to be related to orthostatic hypotension  Chronic CP .   May 25, 2017:  Bianca Garcia seen today for follow-up visit.  She has a history of chest pain.  She is had heart catheterization in the past which did not reveal any significant coronary artery disease.  She had a stroke on September 20, 2017: She had TPA with resolution of her symptoms.  She was started on aspirin and Plavix.  Is in [place].   Is under lots of stress  Has constant chest pain , also has a headache , has had difficulty getting her words out since her stroke  Very fatigued .   Coronary CT angiogram performed in December, 2018 revealed very mild  plaque in the proximal LAD.  There is no significant stenosis.  Her calcium score is 74 Agatston units placing the patient in the 51st percentile for age and gender  Having lots of orthostatic symptoms , Still eats lots of salty foods ( salty foods )   October 27, 2018:  Bianca Garcia is seen today for follow-up visit.  She has a long history of hypertension.  She has a history of noncardiac chest pain.  We have performed a heart catheterization and a coronary CT angiogram both of which showed normal coronary arteries.  She has a history of hyperlipidemia and is on Crestor. Has had a history of a stroke and is on Plavix.  Seems to be doing well. Is still dizzy. No CP  Overall seems to be doing  well   January 10, 2020:  Bianca Garcia seen back for follow-up visit.  She has a history of hypertension.  She is had a TIA in the past.  She is for heart catheterization for some atypical chest pain but she had normal coronary arteries.  She has a history of hyperlipidemia and is on Crestor. Dr. Dagmar Hait has  Says she has constant dyspnea .  Not worsens with exertion Seems to be related to anxiety .  Had a CVA.  Had a loop recorder placed in Dec. 2018.    June 05, 2021: Bianca Garcia seen today for a walk-in visit for preoperative evaluation.  She has a history of hypertension.  She has a history of TIA in the past.  She has had normal coronaries by heart catheterization which was done for atypical chest pain.  She has a history of hyperlipidemia and has been on rosuvastatin. She is had a CVA/TIA and has a implantable loop recorder which was placed in 2018.  She is scheduled for neurosurgery with Dr. Trenton Gammon on Monday .  She has done well from a cardiac standpoint No cp, dyspnea, syncope     Current Outpatient Medications  Medication Sig Dispense Refill   acetaminophen (TYLENOL) 325 MG tablet Take 650 mg by mouth in the morning, at noon, and at bedtime.     aspirin EC 81 MG tablet Take 81 mg by mouth daily. Swallow whole.     Calcium Carbonate 500 (200 Ca) MG WAFR Take by mouth 2 (two) times daily as needed.     Calcium Carbonate-Vitamin D (CALCIUM + D PO) Take 1 tablet by mouth daily.      clonazePAM (KLONOPIN) 0.5 MG tablet Take 0.25 mg by mouth 2 (two) times daily as needed (for anxiety and shakiness).     clopidogrel (PLAVIX) 75 MG tablet Take 1 tablet (75 mg total) by mouth daily. 30 tablet 1   donepezil (ARICEPT) 10 MG tablet Take 10 mg by mouth at bedtime.     gabapentin (NEURONTIN) 100 MG capsule Take 100 mg by mouth daily. In afternoon     gabapentin (NEURONTIN) 300 MG capsule Take 1 capsule (300 mg total) by mouth in the morning and at bedtime. 60 capsule 0   levothyroxine  (SYNTHROID) 88 MCG tablet Take 88 mcg by mouth daily before breakfast.     melatonin 5 MG TABS Take 5 mg by mouth at bedtime as needed.     Multiple Vitamins-Minerals (PRESERVISION AREDS PO) Take 1 tablet by mouth 2 (two) times daily.      omeprazole (PRILOSEC) 40 MG capsule Take 40 mg by mouth daily.     propranolol (INDERAL) 40 MG tablet Take 1 tablet (40 mg  total) by mouth 2 (two) times daily. Please call [phone] to schedule an appt. 180 tablet 0   rosuvastatin (CRESTOR) 5 MG tablet Take 1 tablet (5 mg total) by mouth daily. 90 tablet 3   No current facility-administered medications for this visit.   Family History  Problem Relation Age of Onset   Bladder Cancer Father    Mitral valve prolapse Mother    Cancer Maternal Grandfather    Smoked briefly as a teenager.   Allergies  Allergen Reactions   Fluticasone Other (See Comments)    DRY EYES   Aciphex [Rabeprazole]     Critical    Codeine Nausea Only   Cymbalta [Duloxetine Hcl]     Critical    Lexapro [Escitalopram]     critical   Lipitor [Atorvastatin Calcium]     Intolerant    Pneumovax [Pneumococcal Polysaccharide Vaccine]     moderate   Prevacid [Lansoprazole]    Rosuvastatin     Intolerant    Sertraline     critical   Statins     Critical    Sulfa Antibiotics     vomiting   Tetracyclines & Related     Morphine Sulfate   Pneumovax 23 [Pneumococcal Vac Polyvalent] Rash    Injection site reaction    Past Medical History:  Diagnosis Date   Anxiety    Arthritis    Biceps tendonitis 01/05/2013   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 Street of back injections by Dr. Brien Few 2016 with good relief, history of cervical DD with possiblity of surgery by Dr. Trenton Gammon.   GERD (gastroesophageal reflux disease)    IBS with moderate refluc seen on upper GI study form January 2011 Dr.  Watt Climes   HTN (hypertension)    no meds   Hyperlipidemia    Hypothyroidism    IBS (irritable bowel syndrome)    Imbalance    Impingement syndrome of right shoulder 01/05/2013   Mass of left side of neck    Memory loss    Mood disorder ([place])    MVP (mitral valve prolapse)    Hx of    Osteopenia    Peripheral edema    Right rotator cuff tear 01/05/2013   Sciatic pain    recurrent   Seasonal allergies    Stroke [facility])    Tension headache    Tremor     Past Surgical History:  Procedure Laterality Date   ANTERIOR CERVICAL DECOMP/DISCECTOMY FUSION  04/2015    Dr. Pamala Hurry, [place]  2004   which revealed smooth and normal coronary arteries   CARPAL TUNNEL RELEASE  2000   lt   CATARACT EXTRACTION W/ INTRAOCULAR LENS  IMPLANT, BILATERAL  1/17   Dr. Lucita Ferrara   COSMETIC SURGERY  2010   eyes-facial   DENTAL SURGERY  1960   LASIK     LOOP RECORDER INSERTION N/A 09/22/2017   Procedure: LOOP RECORDER INSERTION;  Surgeon: Constance Haw, MD;  Location: [place] [facility];  Service: Cardiovascular;  Laterality: N/A;   SHOULDER ARTHROSCOPY WITH ROTATOR CUFF REPAIR Right 01/05/2013   Procedure: SHOULDER ARTHROSCOPY WITH ARTHROSCOPIC  ROTATOR CUFF REPAIR, ACROMIOPLASTY, EXTENSIVE DEBRIDEMENT;  Surgeon: Johnny Bridge, MD;  Location: [place] [facility];  Service: Orthopedics;  Laterality: Right;   TEE WITHOUT CARDIOVERSION N/A 09/22/2017   Procedure:  TRANSESOPHAGEAL ECHOCARDIOGRAM (TEE);  Surgeon: Sanda Klein, MD;  Location: [facility] ENDOSCOPY;  Service: Cardiovascular;  Laterality: N/A;   TONSILLECTOMY     TUBAL LIGATION      Social History   Tobacco Use  Smoking Status Former   Types: Cigarettes   Quit date: 01/05/1960   Years since quitting: 61.4  Smokeless Tobacco Never  Tobacco Comments   Remote Hx    Social History   Substance and Sexual Activity  Alcohol Use Yes   Alcohol/week: 7.0 standard drinks   Types: 7 Cans of beer per week    Comment: daily beer    Family History  Problem Relation Age of Onset   Bladder Cancer Father    Mitral valve prolapse Mother    Cancer Maternal Grandfather     Reviw of Systems:  Reviewed in the HPI.  All other systems are negative.   Physical Exam: Last menstrual period 09/28/1979.  GEN:  Well nourished, well developed in no acute distress HEENT: Normal NECK: No JVD; No carotid bruits LYMPHATICS: No lymphadenopathy CARDIAC: RRR , no murmurs, rubs, gallops RESPIRATORY:  Clear to auscultation without rales, wheezing or rhonchi  ABDOMEN: Soft, non-tender, non-distended MUSCULOSKELETAL:  No edema; No deformity  SKIN: Warm and dry NEUROLOGIC:  Alert and oriented x 3     ECG:   June 05 2021: Sinus bradycardia 52.  No ST or T wave changes.   Assessment / Plan:   1.  Preoperative visit prior to laminectomy: She is scheduled to have elective laminectomy She is at low risk from a cardiac standpoint.  I have called the nurse at [facility] and have verified that the have held the aspirin and Plavix since September 5.  I have given the okay to hold the aspirin Plavix for 5 to 7 days so this is perfect. She will need to restart her aspirin and Plavix when given the okay from Dr. Trenton Gammon.  I will see her again in 1 year.    2.  Essential hypertension:    BP is well controlled   [name], MD  06/05/2021 10:17 AM    [facility] [place],  [place], [place]  [address] Pager [phone] Phone: [phone]; Fax: [phone]

## 2021-06-05 NOTE — Progress Notes (Signed)
Spoke with pt for pre-op call. Pt lives in an Assisted living at [place]. She doesn't keep her medications at her apartment, she goes to the nursing station to get her medications. I have faxed the medications that she needs to hold as of today and the medications that she can take the day of surgery to [facility], LPN.  Pt has hx of a stroke in 2018. She is on Aspirin and Plavix. The assisted living has held both since 06/01/21. Dr. Elmarie Shiley office verified that today. He has given cardiac clearance for patient.

## 2021-06-08 ENCOUNTER — Ambulatory Visit (HOSPITAL_COMMUNITY): Payer: Medicare Other

## 2021-06-08 ENCOUNTER — Observation Stay (HOSPITAL_COMMUNITY)
Admission: RE | Admit: 2021-06-08 | Discharge: 2021-06-09 | Disposition: A | Discharge status: Home / Self Care | Payer: Medicare Other | Attending: Neurosurgery | Admitting: Neurosurgery

## 2021-06-08 ENCOUNTER — Encounter (HOSPITAL_COMMUNITY): Payer: Self-pay | Admitting: Neurosurgery

## 2021-06-08 ENCOUNTER — Other Ambulatory Visit: Payer: Self-pay

## 2021-06-08 ENCOUNTER — Ambulatory Visit (HOSPITAL_COMMUNITY): Payer: Medicare Other | Admitting: Certified Registered Nurse Anesthetist

## 2021-06-08 ENCOUNTER — Ambulatory Visit (HOSPITAL_COMMUNITY)
Admission: RE | Disposition: A | Discharge status: Home / Self Care | Payer: Self-pay | Source: Home / Self Care | Attending: Neurosurgery

## 2021-06-08 DIAGNOSIS — Z8673 Personal history of transient ischemic attack (TIA), and cerebral infarction without residual deficits: Secondary | ICD-10-CM | POA: Insufficient documentation

## 2021-06-08 DIAGNOSIS — Z87891 Personal history of nicotine dependence: Secondary | ICD-10-CM | POA: Diagnosis not present

## 2021-06-08 DIAGNOSIS — M5136 Other intervertebral disc degeneration, lumbar region: Secondary | ICD-10-CM | POA: Diagnosis not present

## 2021-06-08 DIAGNOSIS — Z419 Encounter for procedure for purposes other than remedying health state, unspecified: Secondary | ICD-10-CM

## 2021-06-08 DIAGNOSIS — Z79899 Other long term (current) drug therapy: Secondary | ICD-10-CM | POA: Insufficient documentation

## 2021-06-08 DIAGNOSIS — M4316 Spondylolisthesis, lumbar region: Secondary | ICD-10-CM | POA: Diagnosis not present

## 2021-06-08 DIAGNOSIS — I10 Essential (primary) hypertension: Secondary | ICD-10-CM | POA: Diagnosis not present

## 2021-06-08 DIAGNOSIS — M4807 Spinal stenosis, lumbosacral region: Secondary | ICD-10-CM | POA: Diagnosis not present

## 2021-06-08 DIAGNOSIS — M48062 Spinal stenosis, lumbar region with neurogenic claudication: Secondary | ICD-10-CM | POA: Diagnosis not present

## 2021-06-08 DIAGNOSIS — M5116 Intervertebral disc disorders with radiculopathy, lumbar region: Secondary | ICD-10-CM | POA: Diagnosis not present

## 2021-06-08 DIAGNOSIS — E785 Hyperlipidemia, unspecified: Secondary | ICD-10-CM | POA: Diagnosis not present

## 2021-06-08 DIAGNOSIS — E039 Hypothyroidism, unspecified: Secondary | ICD-10-CM | POA: Diagnosis not present

## 2021-06-08 DIAGNOSIS — M4326 Fusion of spine, lumbar region: Secondary | ICD-10-CM | POA: Diagnosis not present

## 2021-06-08 DIAGNOSIS — Z981 Arthrodesis status: Secondary | ICD-10-CM | POA: Diagnosis not present

## 2021-06-08 HISTORY — PX: LUMBAR LAMINECTOMY/DECOMPRESSION MICRODISCECTOMY: SHX5026

## 2021-06-08 LAB — SURGICAL PCR SCREEN
MRSA, PCR: NEGATIVE
Staphylococcus aureus: NEGATIVE

## 2021-06-08 SURGERY — LUMBAR LAMINECTOMY/DECOMPRESSION MICRODISCECTOMY 1 LEVEL
Anesthesia: General | Site: Back

## 2021-06-08 MED ORDER — 0.9 % SODIUM CHLORIDE (POUR BTL) OPTIME
TOPICAL | Status: DC | PRN
  Administered 2021-06-08: 1000 mL

## 2021-06-08 MED ORDER — CALCIUM CARBONATE ANTACID 500 MG PO CHEW: 2.0000 | CHEWABLE_TABLET | Freq: Two times a day (BID) | ORAL | Status: DC | PRN

## 2021-06-08 MED ORDER — OYSTER SHELL CALCIUM/D 500-5 MG-MCG PO TABS
1.0000 | ORAL_TABLET | Freq: Every day | ORAL | Status: DC
  Filled 2021-06-08: qty 1

## 2021-06-08 MED ORDER — FENTANYL CITRATE (PF) 100 MCG/2ML IJ SOLN
INTRAMUSCULAR | Status: AC
  Filled 2021-06-08: qty 2

## 2021-06-08 MED ORDER — EPHEDRINE SULFATE-NACL 50-0.9 MG/10ML-% IV SOSY
PREFILLED_SYRINGE | INTRAVENOUS | Status: DC | PRN
  Administered 2021-06-08 (×3): 5 mg via INTRAVENOUS

## 2021-06-08 MED ORDER — ASPIRIN EC 81 MG PO TBEC
81.0000 mg | DELAYED_RELEASE_TABLET | Freq: Every day | ORAL | Status: DC
  Administered 2021-06-08 – 2021-06-09 (×2): 81 mg via ORAL
  Filled 2021-06-08 (×2): qty 1

## 2021-06-08 MED ORDER — DEXAMETHASONE SODIUM PHOSPHATE 10 MG/ML IJ SOLN
INTRAMUSCULAR | Status: DC | PRN
  Administered 2021-06-08: 10 mg via INTRAVENOUS

## 2021-06-08 MED ORDER — GABAPENTIN 100 MG PO CAPS
100.0000 mg | ORAL_CAPSULE | Freq: Every day | ORAL | Status: DC
  Administered 2021-06-08 – 2021-06-09 (×2): 100 mg via ORAL
  Filled 2021-06-08 (×2): qty 1

## 2021-06-08 MED ORDER — HYDRALAZINE HCL 20 MG/ML IJ SOLN: 10.0000 mg | Freq: Four times a day (QID) | INTRAMUSCULAR | Status: DC | PRN

## 2021-06-08 MED ORDER — FENTANYL CITRATE (PF) 250 MCG/5ML IJ SOLN
INTRAMUSCULAR | Status: DC | PRN
  Administered 2021-06-08: 100 ug via INTRAVENOUS
  Administered 2021-06-08: 50 ug via INTRAVENOUS

## 2021-06-08 MED ORDER — CHLORHEXIDINE GLUCONATE CLOTH 2 % EX PADS: 6.0000 | MEDICATED_PAD | Freq: Once | CUTANEOUS | Status: DC

## 2021-06-08 MED ORDER — ROCURONIUM BROMIDE 10 MG/ML (PF) SYRINGE
PREFILLED_SYRINGE | INTRAVENOUS | Status: DC | PRN
  Administered 2021-06-08 (×2): 50 mg via INTRAVENOUS

## 2021-06-08 MED ORDER — CEFAZOLIN SODIUM-DEXTROSE 1-4 GM/50ML-% IV SOLN
1.0000 g | Freq: Three times a day (TID) | INTRAVENOUS | Status: AC
  Administered 2021-06-08 – 2021-06-09 (×2): 1 g via INTRAVENOUS
  Filled 2021-06-08 (×2): qty 50

## 2021-06-08 MED ORDER — KETOROLAC TROMETHAMINE 15 MG/ML IJ SOLN
INTRAMUSCULAR | Status: DC | PRN
  Administered 2021-06-08: 15 mg via INTRAVENOUS

## 2021-06-08 MED ORDER — FENTANYL CITRATE (PF) 100 MCG/2ML IJ SOLN
25.0000 ug | INTRAMUSCULAR | Status: DC | PRN
  Administered 2021-06-08 (×3): 25 ug via INTRAVENOUS

## 2021-06-08 MED ORDER — CALCIUM CARBONATE-VITAMIN D 500-200 MG-UNIT PO TABS
1.0000 | ORAL_TABLET | Freq: Every day | ORAL | Status: DC
  Administered 2021-06-08 – 2021-06-09 (×2): 1 via ORAL
  Filled 2021-06-08 (×2): qty 1

## 2021-06-08 MED ORDER — PROPOFOL 10 MG/ML IV BOLUS
INTRAVENOUS | Status: DC | PRN
  Administered 2021-06-08: 30 mg via INTRAVENOUS
  Administered 2021-06-08: 20 mg via INTRAVENOUS
  Administered 2021-06-08: 150 mg via INTRAVENOUS

## 2021-06-08 MED ORDER — EPHEDRINE 5 MG/ML INJ
INTRAVENOUS | Status: AC
  Filled 2021-06-08: qty 5

## 2021-06-08 MED ORDER — OCUVITE-LUTEIN PO CAPS
ORAL_CAPSULE | Freq: Two times a day (BID) | ORAL | Status: DC
  Filled 2021-06-08: qty 1

## 2021-06-08 MED ORDER — THROMBIN (RECOMBINANT) 5000 UNITS EX SOLR
CUTANEOUS | Status: AC
  Filled 2021-06-08: qty 5000

## 2021-06-08 MED ORDER — HYDROCODONE-ACETAMINOPHEN 10-325 MG PO TABS: 1.0000 | ORAL_TABLET | ORAL | Status: DC | PRN

## 2021-06-08 MED ORDER — CHLORHEXIDINE GLUCONATE 0.12 % MT SOLN
15.0000 mL | Freq: Once | OROMUCOSAL | Status: AC
  Administered 2021-06-08: 15 mL via OROMUCOSAL
  Filled 2021-06-08: qty 15

## 2021-06-08 MED ORDER — CEFAZOLIN SODIUM-DEXTROSE 2-4 GM/100ML-% IV SOLN
2.0000 g | INTRAVENOUS | Status: AC
  Administered 2021-06-08: 2 g via INTRAVENOUS
  Filled 2021-06-08: qty 100

## 2021-06-08 MED ORDER — MELATONIN 5 MG PO TABS: 5.0000 mg | ORAL_TABLET | Freq: Every evening | ORAL | Status: DC | PRN

## 2021-06-08 MED ORDER — HYDROCODONE-ACETAMINOPHEN 5-325 MG PO TABS
1.0000 | ORAL_TABLET | ORAL | Status: DC | PRN
  Administered 2021-06-08: 1 via ORAL
  Filled 2021-06-08: qty 1

## 2021-06-08 MED ORDER — ROCURONIUM BROMIDE 10 MG/ML (PF) SYRINGE
PREFILLED_SYRINGE | INTRAVENOUS | Status: AC
  Filled 2021-06-08: qty 10

## 2021-06-08 MED ORDER — LIDOCAINE 2% (20 MG/ML) 5 ML SYRINGE
INTRAMUSCULAR | Status: DC | PRN
  Administered 2021-06-08: 100 mg via INTRAVENOUS

## 2021-06-08 MED ORDER — CLONAZEPAM 0.5 MG PO TABS: 0.2500 mg | ORAL_TABLET | Freq: Two times a day (BID) | ORAL | Status: DC | PRN

## 2021-06-08 MED ORDER — ACETAMINOPHEN 325 MG PO TABS
650.0000 mg | ORAL_TABLET | ORAL | Status: DC | PRN
  Administered 2021-06-09 (×3): 650 mg via ORAL
  Filled 2021-06-08 (×3): qty 2

## 2021-06-08 MED ORDER — SODIUM CHLORIDE 0.9% FLUSH: 3.0000 mL | INTRAVENOUS | Status: DC | PRN

## 2021-06-08 MED ORDER — HYDROMORPHONE HCL 1 MG/ML IJ SOLN: 1.0000 mg | INTRAMUSCULAR | Status: DC | PRN

## 2021-06-08 MED ORDER — PHENOL 1.4 % MT LIQD: 1.0000 | OROMUCOSAL | Status: DC | PRN

## 2021-06-08 MED ORDER — PROPRANOLOL HCL 40 MG PO TABS
40.0000 mg | ORAL_TABLET | Freq: Two times a day (BID) | ORAL | Status: DC
  Administered 2021-06-08 – 2021-06-09 (×2): 40 mg via ORAL
  Filled 2021-06-08 (×3): qty 1

## 2021-06-08 MED ORDER — ACETAMINOPHEN 650 MG RE SUPP: 650.0000 mg | RECTAL | Status: DC | PRN

## 2021-06-08 MED ORDER — PHENYLEPHRINE HCL-NACL 20-0.9 MG/250ML-% IV SOLN
INTRAVENOUS | Status: DC | PRN
  Administered 2021-06-08: 20 ug/min via INTRAVENOUS

## 2021-06-08 MED ORDER — PROPOFOL 10 MG/ML IV BOLUS
INTRAVENOUS | Status: AC
  Filled 2021-06-08: qty 20

## 2021-06-08 MED ORDER — DONEPEZIL HCL 10 MG PO TABS
10.0000 mg | ORAL_TABLET | Freq: Every day | ORAL | Status: DC
  Administered 2021-06-08 – 2021-06-09 (×2): 10 mg via ORAL
  Filled 2021-06-08 (×2): qty 1

## 2021-06-08 MED ORDER — BUPIVACAINE HCL (PF) 0.25 % IJ SOLN
INTRAMUSCULAR | Status: DC | PRN
  Administered 2021-06-08: 20 mL

## 2021-06-08 MED ORDER — ONDANSETRON HCL 4 MG/2ML IJ SOLN
INTRAMUSCULAR | Status: DC | PRN
  Administered 2021-06-08: 4 mg via INTRAVENOUS

## 2021-06-08 MED ORDER — ROSUVASTATIN CALCIUM 5 MG PO TABS
5.0000 mg | ORAL_TABLET | Freq: Every day | ORAL | Status: DC
  Administered 2021-06-08 – 2021-06-09 (×2): 5 mg via ORAL
  Filled 2021-06-08 (×2): qty 1

## 2021-06-08 MED ORDER — DEXAMETHASONE SODIUM PHOSPHATE 10 MG/ML IJ SOLN
INTRAMUSCULAR | Status: AC
  Filled 2021-06-08: qty 1

## 2021-06-08 MED ORDER — OMEPRAZOLE 20 MG PO CPDR
40.0000 mg | DELAYED_RELEASE_CAPSULE | Freq: Every day | ORAL | Status: DC
  Administered 2021-06-08 – 2021-06-09 (×2): 40 mg via ORAL
  Filled 2021-06-08 (×2): qty 2

## 2021-06-08 MED ORDER — KETOROLAC TROMETHAMINE 30 MG/ML IJ SOLN
INTRAMUSCULAR | Status: AC
  Filled 2021-06-08: qty 1

## 2021-06-08 MED ORDER — ORAL CARE MOUTH RINSE: 15.0000 mL | Freq: Once | OROMUCOSAL | Status: AC

## 2021-06-08 MED ORDER — KETOROLAC TROMETHAMINE 15 MG/ML IJ SOLN
15.0000 mg | Freq: Four times a day (QID) | INTRAMUSCULAR | Status: AC
  Administered 2021-06-08 – 2021-06-09 (×3): 15 mg via INTRAVENOUS
  Filled 2021-06-08 (×3): qty 1

## 2021-06-08 MED ORDER — ONDANSETRON HCL 4 MG/2ML IJ SOLN: 4.0000 mg | Freq: Four times a day (QID) | INTRAMUSCULAR | Status: DC | PRN

## 2021-06-08 MED ORDER — GABAPENTIN 300 MG PO CAPS
300.0000 mg | ORAL_CAPSULE | Freq: Two times a day (BID) | ORAL | Status: DC
  Administered 2021-06-08 – 2021-06-09 (×2): 300 mg via ORAL
  Filled 2021-06-08 (×2): qty 1

## 2021-06-08 MED ORDER — SUGAMMADEX SODIUM 200 MG/2ML IV SOLN
INTRAVENOUS | Status: DC | PRN
  Administered 2021-06-08: 200 mg via INTRAVENOUS

## 2021-06-08 MED ORDER — PROSIGHT PO TABS
1.0000 | ORAL_TABLET | Freq: Two times a day (BID) | ORAL | Status: DC
  Administered 2021-06-08 – 2021-06-09 (×2): 1 via ORAL
  Filled 2021-06-08 (×3): qty 1

## 2021-06-08 MED ORDER — FENTANYL CITRATE (PF) 250 MCG/5ML IJ SOLN
INTRAMUSCULAR | Status: AC
  Filled 2021-06-08: qty 5

## 2021-06-08 MED ORDER — LEVOTHYROXINE SODIUM 88 MCG PO TABS
88.0000 ug | ORAL_TABLET | Freq: Every day | ORAL | Status: DC
  Administered 2021-06-09: 88 ug via ORAL
  Filled 2021-06-08 (×2): qty 1

## 2021-06-08 MED ORDER — CLONAZEPAM 0.25 MG PO TBDP: 0.2500 mg | ORAL_TABLET | Freq: Two times a day (BID) | ORAL | Status: DC | PRN

## 2021-06-08 MED ORDER — MENTHOL 3 MG MT LOZG: 1.0000 | LOZENGE | OROMUCOSAL | Status: DC | PRN

## 2021-06-08 MED ORDER — LACTATED RINGERS IV SOLN: INTRAVENOUS | Status: DC

## 2021-06-08 MED ORDER — SODIUM CHLORIDE 0.9% FLUSH: 3.0000 mL | Freq: Two times a day (BID) | INTRAVENOUS | Status: DC

## 2021-06-08 MED ORDER — CYCLOBENZAPRINE HCL 10 MG PO TABS: 10.0000 mg | ORAL_TABLET | Freq: Three times a day (TID) | ORAL | Status: DC | PRN

## 2021-06-08 MED ORDER — BUPIVACAINE HCL (PF) 0.25 % IJ SOLN
INTRAMUSCULAR | Status: AC
  Filled 2021-06-08: qty 30

## 2021-06-08 MED ORDER — PANTOPRAZOLE SODIUM 40 MG PO TBEC: 40.0000 mg | DELAYED_RELEASE_TABLET | Freq: Every day | ORAL | Status: DC

## 2021-06-08 MED ORDER — SODIUM CHLORIDE 0.9 % IV SOLN
250.0000 mL | INTRAVENOUS | Status: DC
  Administered 2021-06-08: 250 mL via INTRAVENOUS

## 2021-06-08 MED ORDER — ONDANSETRON HCL 4 MG/2ML IJ SOLN
INTRAMUSCULAR | Status: AC
  Filled 2021-06-08: qty 2

## 2021-06-08 MED ORDER — THROMBIN 5000 UNITS EX SOLR: CUTANEOUS | Status: DC | PRN

## 2021-06-08 MED ORDER — ONDANSETRON HCL 4 MG PO TABS: 4.0000 mg | ORAL_TABLET | Freq: Four times a day (QID) | ORAL | Status: DC | PRN

## 2021-06-08 MED ORDER — LIDOCAINE 2% (20 MG/ML) 5 ML SYRINGE
INTRAMUSCULAR | Status: AC
  Filled 2021-06-08: qty 5

## 2021-06-08 MED ORDER — THROMBIN 5000 UNITS EX SOLR
OROMUCOSAL | Status: DC | PRN
  Administered 2021-06-08: 5 mL via TOPICAL

## 2021-06-08 SURGICAL SUPPLY — 53 items
ADH SKN CLS APL DERMABOND .7 (GAUZE/BANDAGES/DRESSINGS) ×1
ADH SKN CLS LQ APL DERMABOND (GAUZE/BANDAGES/DRESSINGS) ×1
APL SKNCLS STERI-STRIP NONHPOA (GAUZE/BANDAGES/DRESSINGS) ×1
BAG COUNTER SPONGE SURGICOUNT (BAG) ×2 IMPLANT
BAG DECANTER FOR FLEXI CONT (MISCELLANEOUS) ×2 IMPLANT
BAG SPNG CNTER NS LX DISP (BAG) ×1
BAND INSRT 18 STRL LF DISP RB (MISCELLANEOUS) ×2
BAND RUBBER #18 3X1/16 STRL (MISCELLANEOUS) ×4 IMPLANT
BENZOIN TINCTURE PRP APPL 2/3 (GAUZE/BANDAGES/DRESSINGS) ×2 IMPLANT
BUR CUTTER 7.0 ROUND (BURR) ×2 IMPLANT
CANISTER SUCT 3000ML PPV (MISCELLANEOUS) ×2 IMPLANT
CARTRIDGE OIL MAESTRO DRILL (MISCELLANEOUS) ×1 IMPLANT
CLSR STERI-STRIP ANTIMIC 1/2X4 (GAUZE/BANDAGES/DRESSINGS) ×2 IMPLANT
DECANTER SPIKE VIAL GLASS SM (MISCELLANEOUS) ×2 IMPLANT
DERMABOND ADHESIVE PROPEN (GAUZE/BANDAGES/DRESSINGS) ×1
DERMABOND ADVANCED (GAUZE/BANDAGES/DRESSINGS) ×1
DERMABOND ADVANCED .7 DNX12 (GAUZE/BANDAGES/DRESSINGS) ×1 IMPLANT
DERMABOND ADVANCED .7 DNX6 (GAUZE/BANDAGES/DRESSINGS) ×1 IMPLANT
DIFFUSER DRILL AIR PNEUMATIC (MISCELLANEOUS) ×2 IMPLANT
DRAPE HALF SHEET 40X57 (DRAPES) ×4 IMPLANT
DRAPE LAPAROTOMY 100X72X124 (DRAPES) ×2 IMPLANT
DRAPE MICROSCOPE LEICA (MISCELLANEOUS) ×2 IMPLANT
DRAPE SURG 17X23 STRL (DRAPES) ×4 IMPLANT
DRSG OPSITE POSTOP 4X6 (GAUZE/BANDAGES/DRESSINGS) ×2 IMPLANT
DURAPREP 26ML APPLICATOR (WOUND CARE) ×2 IMPLANT
ELECT REM PT RETURN 9FT ADLT (ELECTROSURGICAL) ×2
ELECTRODE REM PT RTRN 9FT ADLT (ELECTROSURGICAL) ×1 IMPLANT
GAUZE 4X4 16PLY ~~LOC~~+RFID DBL (SPONGE) ×2 IMPLANT
GAUZE SPONGE 4X4 12PLY STRL (GAUZE/BANDAGES/DRESSINGS) ×2 IMPLANT
GLOVE EXAM NITRILE XL STR (GLOVE) ×2 IMPLANT
GLOVE SURG ENC MOIS LTX SZ6.5 (GLOVE) ×2 IMPLANT
GLOVE SURG LTX SZ9 (GLOVE) ×2 IMPLANT
GLOVE SURG UNDER POLY LF SZ6.5 (GLOVE) ×2 IMPLANT
GOWN STRL REUS W/ TWL LRG LVL3 (GOWN DISPOSABLE) ×1 IMPLANT
GOWN STRL REUS W/ TWL XL LVL3 (GOWN DISPOSABLE) ×1 IMPLANT
GOWN STRL REUS W/TWL 2XL LVL3 (GOWN DISPOSABLE) ×2 IMPLANT
GOWN STRL REUS W/TWL LRG LVL3 (GOWN DISPOSABLE) ×2
GOWN STRL REUS W/TWL XL LVL3 (GOWN DISPOSABLE) ×2
KIT BASIN OR (CUSTOM PROCEDURE TRAY) ×2 IMPLANT
KIT TURNOVER KIT B (KITS) ×2 IMPLANT
NEEDLE HYPO 22GX1.5 SAFETY (NEEDLE) ×2 IMPLANT
NEEDLE SPNL 22GX3.5 QUINCKE BK (NEEDLE) ×2 IMPLANT
NS IRRIG 1000ML POUR BTL (IV SOLUTION) ×2 IMPLANT
OIL CARTRIDGE MAESTRO DRILL (MISCELLANEOUS) ×2
PACK LAMINECTOMY NEURO (CUSTOM PROCEDURE TRAY) ×2 IMPLANT
PAD ARMBOARD 7.5X6 YLW CONV (MISCELLANEOUS) ×6 IMPLANT
SPONGE SURGIFOAM ABS GEL SZ50 (HEMOSTASIS) ×2 IMPLANT
STRIP CLOSURE SKIN 1/2X4 (GAUZE/BANDAGES/DRESSINGS) ×2 IMPLANT
SUT VIC AB 2-0 CT1 18 (SUTURE) ×2 IMPLANT
SUT VIC AB 3-0 SH 8-18 (SUTURE) ×2 IMPLANT
TOWEL GREEN STERILE (TOWEL DISPOSABLE) ×2 IMPLANT
TOWEL GREEN STERILE FF (TOWEL DISPOSABLE) ×2 IMPLANT
WATER STERILE IRR 1000ML POUR (IV SOLUTION) ×2 IMPLANT

## 2021-06-08 NOTE — Op Note (Signed)
Date of procedure: 06/08/2021  Date of dictation: Same  Service: Neurosurgery  Preoperative diagnosis: L4-5, L5-S1 stenosis with neurogenic claudication  Postoperative diagnosis: Same  Procedure Name: L4-5, L5-S1 decompressive laminectomy with foraminotomies  Surgeon:Treylin Burtch A.Breia Ocampo, M.D.  Asst. 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  Patient with severe multifactorial stenosis at L4-5 and L5S1.  Patient presents now for decompressive surgery in hopes of improving her symptoms.  Operative note: After induction of anesthesia, patient position prone on the Wilson frame and properly padded.  Lumbar region prepped and draped sterilely.  Incision made overlying L4-5 and S1.  Dissection performed bilaterally.  Retractor placed.  X-ray taken.  Levels confirmed.  Decompressive laminectomy then performed using Leksell rongeurs Kerrison rongeurs and high-speed drill to remove the entire lamina of L4, the entire lamina of L5, and the superior aspect lamina of S1.  Medial facetectomies of L4-5 and L5-S1 were also performed.  Ligament flavum elevated and resected.  The gutters were undercut and foraminotomies were completed on the course the exiting L4-L5 and S1 nerve roots bilaterally.  There was a very thorough decompression.  There was no evidence of injury to the thecal sac or nerve roots.  The wound is then irrigated with antibiotic solution.  Gelfoam was placed topically for hemostasis.  Wounds and closed in layers with Vicryl sutures.  Steri-Strips and sterile dressing were applied.  No apparent complications.  Patient  tolerated the procedure well and she returns to the recovery room postop.

## 2021-06-08 NOTE — Anesthesia Postprocedure Evaluation (Signed)
Anesthesia Post Note  Patient: Bianca Garcia  Procedure(s) Performed: Laminectomy and Foraminotomy - L4-L5, L5-S1 (Back)     Anesthesia Post Evaluation No notable events documented.  Last Vitals:  Vitals:   06/08/21 1510 06/08/21 1525  BP: (!) 143/67 (!) 148/62  Pulse: (!) 52 (!) 53  Resp: 13 11  Temp:    SpO2: 100% 93%    Last Pain:  Vitals:   06/08/21 1455  PainSc: 0-No pain                 Noya Santarelli

## 2021-06-08 NOTE — Anesthesia Preprocedure Evaluation (Signed)
Anesthesia Evaluation  Patient identified by MRN, date of birth, ID band Patient awake    Reviewed: Allergy & Precautions, NPO status , Patient's Chart, lab work & pertinent test results  Airway Mallampati: II  TM Distance: >3 FB     Dental   Pulmonary former smoker,    breath sounds clear to auscultation       Cardiovascular hypertension, + CAD   Rhythm:Regular Rate:Normal     Neuro/Psych  Headaches, Anxiety  Neuromuscular disease CVA    GI/Hepatic Neg liver ROS, GERD  ,  Endo/Other  Hypothyroidism   Renal/GU negative Renal ROS     Musculoskeletal   Abdominal   Peds  Hematology   Anesthesia Other Findings   Reproductive/Obstetrics                             Anesthesia Physical Anesthesia Plan  ASA: 3  Anesthesia Plan: General   Post-op Pain Management:    Induction: Intravenous  PONV Risk Score and Plan: 3 and Ondansetron, Dexamethasone and Midazolam  Airway Management Planned: Oral ETT  Additional Equipment:   Intra-op Plan:   Post-operative Plan: Extubation in OR  Informed Consent: I have reviewed the patients History and Physical, chart, labs and discussed the procedure including the risks, benefits and alternatives for the proposed anesthesia with the patient or authorized representative who has indicated his/her understanding and acceptance.     Dental advisory given  Plan Discussed with: CRNA and Anesthesiologist  Anesthesia Plan Comments:         Anesthesia Quick Evaluation

## 2021-06-08 NOTE — Transfer of Care (Signed)
Immediate Anesthesia Transfer of Care Note  Patient: Bianca Garcia  Procedure(s) Performed: Laminectomy and Foraminotomy - L4-L5, L5-S1 (Back)  Patient Location: PACU  Anesthesia Type:General  Level of Consciousness: drowsy  Airway & Oxygen Therapy: Patient Spontanous Breathing and Patient connected to nasal cannula oxygen  Post-op Assessment: Report given to RN and Post -op Vital signs reviewed and stable  Post vital signs: Reviewed and stable  Last Vitals:  Vitals Value Taken Time  BP 144/61 06/08/21 1455  Temp 36.4 C 06/08/21 1455  Pulse 61 06/08/21 1503  Resp 19 06/08/21 1503  SpO2 99 % 06/08/21 1503  Vitals shown include unvalidated device data.  Last Pain:  Vitals:   06/08/21 1455  PainSc: 0-No pain      Patients Stated Pain Goal: 4 ([id number])  Complications: No notable events documented.

## 2021-06-08 NOTE — Anesthesia Postprocedure Evaluation (Signed)
Anesthesia Post Note  Patient: Bianca Garcia  Procedure(s) Performed: Laminectomy and Foraminotomy - L4-L5, L5-S1 (Back)     Patient location during evaluation: PACU Anesthesia Type: General Level of consciousness: awake Pain management: pain level controlled Respiratory status: spontaneous breathing Cardiovascular status: stable Postop Assessment: no apparent nausea or vomiting Anesthetic complications: no   No notable events documented.  Last Vitals:  Vitals:   06/08/21 1510 06/08/21 1525  BP: (!) 143/67 (!) 148/62  Pulse: (!) 52 (!) 53  Resp: 13 11  Temp:    SpO2: 100% 93%    Last Pain:  Vitals:   06/08/21 1455  PainSc: 0-No pain                 [name]

## 2021-06-08 NOTE — Brief Op Note (Signed)
06/08/2021  2:25 PM  PATIENT:  Ernie Hew  80 y.o. female  PRE-OPERATIVE DIAGNOSIS:  Stenosis  POST-OPERATIVE DIAGNOSIS:  Stenosis  PROCEDURE:  Procedure(s): Laminectomy and Foraminotomy - L4-L5, L5-S1 (N/A)  SURGEON:  Surgeon(s) and Role:    Earnie Larsson, MD - Primary  PHYSICIAN ASSISTANT:   ASSISTANTSMearl Latin   ANESTHESIA:   general  EBL:  100 mL   BLOOD ADMINISTERED:none  DRAINS: none   LOCAL MEDICATIONS USED:  MARCAINE     SPECIMEN:  No Specimen  DISPOSITION OF SPECIMEN:  N/A  COUNTS:  YES  TOURNIQUET:  * No tourniquets in log *  DICTATION: .Dragon Dictation  PLAN OF CARE: Admit for overnight observation  PATIENT DISPOSITION:  PACU - hemodynamically stable.   Delay start of Pharmacological VTE agent (>24hrs) due to surgical blood loss or risk of bleeding: yes

## 2021-06-08 NOTE — Anesthesia Procedure Notes (Addendum)
Procedure Name: Intubation Date/Time: 06/08/2021 12:24 PM Performed by: Valda Favia, CRNA Pre-anesthesia Checklist: Patient identified, Emergency Drugs available, Suction available, Patient being monitored and Timeout performed Patient Re-evaluated:Patient Re-evaluated prior to induction Oxygen Delivery Method: Circle system utilized Preoxygenation: Pre-oxygenation with 100% oxygen Induction Type: IV induction Ventilation: Mask ventilation without difficulty Laryngoscope Size: Mac, 4, Miller, 2 and Glidescope Grade View: Grade III Tube type: Oral Tube size: 7.0 mm Number of attempts: 1 Airway Equipment and Method: Stylet and Video-laryngoscopy Placement Confirmation: ETT inserted through vocal cords under direct vision, positive ETCO2 and breath sounds checked- equal and bilateral Secured at: 21 cm Tube secured with: Tape Dental Injury: Injury to lip  Difficulty Due To: Difficult Airway- due to limited oral opening Comments: DL x 1 by A.Talor Cheema, CRNA Grade III view stiff epiglottis unable to pass oral ETT, resume mask ventilation VSS. DL x 2 By C. Green MD miller 2 grade III view unable to pass ETT, resume mask ventilation. Glidescope 4 utilized by A Conservation officer, historic buildings for DL x3 . Grade I view successful passing of oral ETT. Abrasion to top lip-ointment applied

## 2021-06-08 NOTE — H&P (Addendum)
Bianca Garcia is an 80 y.o. female.   Chief Complaint: Back pain HPI: 80 year old female with back pain which radiates into both lower extremities.  Symptoms worsen with standing or ambulation and consistent with neurogenic claudication.  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  Patient with severe lumbar stenosis at this level.  Remainder lumbar spine demonstrates significant multilevel degenerative disease but no evidence of high-grade stenosis disc herniation or other areas of nerve root compression.  Patient presents now for decompressive surgery in hopes of improving her symptoms.  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 Street of back injections by Dr. Brien Few 2016 with good relief, history of cervical DD with possiblity of surgery by Dr. Trenton Gammon.   GERD (gastroesophageal reflux disease)    IBS with moderate refluc seen on upper GI study form January 2011 Dr. Watt Climes   HTN (hypertension)    no meds   Hyperlipidemia    Hypothyroidism    IBS (irritable bowel syndrome)    Imbalance    Impingement syndrome of right shoulder 01/05/2013   Mass of left side of neck    Memory loss    Mood disorder ([place])    MVP (mitral valve prolapse)    Hx of    Osteopenia    Peripheral edema    Right rotator cuff tear 01/05/2013   Sciatic pain    recurrent   Seasonal allergies    Stroke [facility])    Tension headache    Tremor     Past Surgical History:  Procedure Laterality Date   ANTERIOR CERVICAL DECOMP/DISCECTOMY FUSION  04/2015    Dr. Pamala Hurry, [place]  2004   which revealed smooth and normal coronary arteries   CARPAL TUNNEL RELEASE   2000   lt   CATARACT EXTRACTION W/ INTRAOCULAR LENS  IMPLANT, BILATERAL  1/17   Dr. Lucita Ferrara   COSMETIC SURGERY  2010   eyes-facial   DENTAL SURGERY  1960   LASIK     LOOP RECORDER INSERTION N/A 09/22/2017   Procedure: LOOP RECORDER INSERTION;  Surgeon: Constance Haw, MD;  Location: [place] [facility];  Service: Cardiovascular;  Laterality: N/A;   SHOULDER ARTHROSCOPY WITH ROTATOR CUFF REPAIR Right 01/05/2013   Procedure: SHOULDER ARTHROSCOPY WITH ARTHROSCOPIC  ROTATOR CUFF REPAIR, ACROMIOPLASTY, EXTENSIVE DEBRIDEMENT;  Surgeon: Johnny Bridge, MD;  Location: [place];  Service: Orthopedics;  Laterality: Right;   TEE WITHOUT CARDIOVERSION N/A 09/22/2017   Procedure: TRANSESOPHAGEAL ECHOCARDIOGRAM (TEE);  Surgeon: Sanda Klein, MD;  Location: [facility] ENDOSCOPY;  Service: Cardiovascular;  Laterality: N/A;   TONSILLECTOMY     TUBAL LIGATION      Family History  Problem Relation Age of Onset   Bladder Cancer Father    Mitral valve prolapse Mother    Cancer Maternal Grandfather    Social History:  reports that she quit smoking about 61 years ago. Her smoking use included cigarettes. She has never used smokeless tobacco. She reports that she does not currently use alcohol. She reports that she does not use drugs.  Allergies:  Allergies  Allergen Reactions   Fluticasone Other (See Comments)  DRY EYES   Aciphex [Rabeprazole]     Critical    Codeine Nausea Only   Cymbalta [Duloxetine Hcl]     Critical    Lexapro [Escitalopram]     critical   Lipitor [Atorvastatin Calcium]     Intolerant    Pneumovax [Pneumococcal Polysaccharide Vaccine]     moderate   Prevacid [Lansoprazole]     Unknown, listed on MAR   Rosuvastatin     Intolerant    Sertraline     critical   Statins     Critical    Sulfa Antibiotics     vomiting   Tetracyclines & Related     Morphine Sulfate   Welchol [Colesevelam]     Unknown, listed on MAR   Pneumovax 23 [Pneumococcal Vac  Polyvalent] Rash    Injection site reaction    Medications Prior to Admission  Medication Sig Dispense Refill   acetaminophen (TYLENOL) 325 MG tablet Take 650 mg by mouth See admin instructions. Take 650 mg 3 times daily, may take 650 mg 2 times daily as needed for pain     Calcium Carb-Cholecalciferol (CALCIUM 500 + D PO) Take 1 tablet by mouth daily.     calcium carbonate (TUMS - DOSED IN MG ELEMENTAL CALCIUM) 500 MG chewable tablet Chew 2 tablets by mouth 2 (two) times daily as needed for indigestion or heartburn.     clonazePAM (KLONOPIN) 0.5 MG tablet Take 0.25 mg by mouth 2 (two) times daily as needed (for anxiety and shakiness).     donepezil (ARICEPT) 10 MG tablet Take 10 mg by mouth daily.     gabapentin (NEURONTIN) 100 MG capsule Take 100 mg by mouth daily in the afternoon.     gabapentin (NEURONTIN) 300 MG capsule Take 1 capsule (300 mg total) by mouth in the morning and at bedtime. 60 capsule 0   levothyroxine (SYNTHROID) 88 MCG tablet Take 88 mcg by mouth daily before breakfast.     melatonin 5 MG TABS Take 5 mg by mouth at bedtime as needed (sleep).     Multiple Vitamins-Minerals (PRESERVISION AREDS PO) Take 1 tablet by mouth 2 (two) times daily.      omeprazole (PRILOSEC) 40 MG capsule Take 40 mg by mouth daily.     propranolol (INDERAL) 40 MG tablet Take 1 tablet (40 mg total) by mouth 2 (two) times daily. Please call [phone] to schedule an appt. 180 tablet 0   rosuvastatin (CRESTOR) 5 MG tablet Take 1 tablet (5 mg total) by mouth daily. 90 tablet 3   aspirin EC 81 MG tablet Take 81 mg by mouth daily. Swallow whole.     clopidogrel (PLAVIX) 75 MG tablet Take 1 tablet (75 mg total) by mouth daily. 30 tablet 1    No results found for this or any previous visit (from the past 48 hour(s)). No results found.  Pertinent items noted in HPI and remainder of comprehensive ROS otherwise negative.  Blood pressure (!) 148/77, pulse (!) 58, temperature 97.8 F (36.6 C), resp. rate  17, height '5\' 1"'$  (1.549 m), weight 63.5 kg, last menstrual period 09/28/1979, SpO2 96 %.  Patient is awake and alert.  She is oriented and appropriate.  Speech is fluent.  Judgment insight are intact.  Cranial nerve function normal bilateral.  Motor examination reveals intact motor strength bilaterally sensory examination with decrease sensation pinprick light touch in her L5 and S1 dermatomes bilateral.  Deep tendon versus hypoactive but symmetric.  No evidence of long  track signs.  Gait is reasonably normal.  Posture is flexed peer examination head ears eyes nose and throat is unremarkable chest and abdomen are benign.  Extremities are free of major deformity. Assessment/Plan L4-5 and L5/S1 stenosis with neurogenic claudication.  Plan L4-5 and L5/S1 decompressive laminectomy with foraminotomies.  Risks and benefits been explained.  Patient wishes to proceed.  Mallie Mussel A Courage Biglow 06/08/2021, 11:39 AM

## 2021-06-08 NOTE — Progress Notes (Signed)
CBC and BMP done 06/04/21, placed in physical chart @ bedside.

## 2021-06-09 ENCOUNTER — Other Ambulatory Visit: Payer: Self-pay | Admitting: Orthopedic Surgery

## 2021-06-09 ENCOUNTER — Encounter (HOSPITAL_COMMUNITY): Payer: Self-pay | Admitting: Neurosurgery

## 2021-06-09 DIAGNOSIS — Z87891 Personal history of nicotine dependence: Secondary | ICD-10-CM | POA: Diagnosis not present

## 2021-06-09 DIAGNOSIS — E039 Hypothyroidism, unspecified: Secondary | ICD-10-CM | POA: Diagnosis not present

## 2021-06-09 DIAGNOSIS — Z8673 Personal history of transient ischemic attack (TIA), and cerebral infarction without residual deficits: Secondary | ICD-10-CM | POA: Diagnosis not present

## 2021-06-09 DIAGNOSIS — Z79899 Other long term (current) drug therapy: Secondary | ICD-10-CM | POA: Diagnosis not present

## 2021-06-09 DIAGNOSIS — M48062 Spinal stenosis, lumbar region with neurogenic claudication: Secondary | ICD-10-CM | POA: Diagnosis not present

## 2021-06-09 DIAGNOSIS — I10 Essential (primary) hypertension: Secondary | ICD-10-CM | POA: Diagnosis not present

## 2021-06-09 MED ORDER — HYDROCODONE-ACETAMINOPHEN 5-325 MG PO TABS: 1.0000 | ORAL_TABLET | ORAL | 0 refills | Status: DC | PRN

## 2021-06-09 MED FILL — Thrombin (Recombinant) For Soln 5000 Unit: CUTANEOUS | Qty: 5000 | Status: AC

## 2021-06-09 NOTE — Evaluation (Signed)
Physical Therapy Evaluation Patient Details Name: Bianca Garcia MRN: [id number] DOB: July 29, 1941 Today's Date: 06/09/2021  History of Present Illness  Pt is an 80 y/o female s/p L4-S1 decompressive lami on 06/08/21. PMH including anxiety, arthritis, DJD, DDD, HTN, hypothyroidism, memory loss, R rotator cuff tear (2014), CVA, and ACDF (2016).   Clinical Impression  Pt admitted with above diagnosis. At the time of PT eval, pt was able to demonstrate transfers and ambulation with gross min guard assist with RW for support. Discussed pt case with Manuela Schwartz from [facility], who reports they have a rehab bed available for pt for "a few days" prior to returning to her Assisted Living apartment with husband. Pt agreeable, however did not remember she had already had surgery and asked when she was going. Pt was educated on precautions, appropriate activity progression, and car transfer. Pt currently with functional limitations due to the deficits listed below (see PT Problem List). Pt will benefit from skilled PT to increase their independence and safety with mobility to allow discharge to the venue listed below.         Recommendations for follow up therapy are one component of a multi-disciplinary discharge planning process, led by the attending physician.  Recommendations may be updated based on patient status, additional functional criteria and insurance authorization.  Follow Up Recommendations SNF;Supervision for mobility/OOB    Equipment Recommendations  Rolling walker with 5" wheels ([employer])    Recommendations for Other Services       Precautions / Restrictions Precautions Precautions: Fall;Back Precaution Booklet Issued: Yes (comment) Precaution Comments: Reviewed back precautions Required Braces or Orthoses: Other Brace Other Brace: No brace per MD order Restrictions Weight Bearing Restrictions: No      Mobility  Bed Mobility Overal bed mobility: Needs Assistance Bed Mobility:  Rolling;Sidelying to Sit;Sit to Sidelying Rolling: Min guard Sidelying to sit: Min guard     Sit to sidelying: Min guard General bed mobility comments: Multimodal cues for proper log roll technique. HOB flat and rails lowered to simulate home environment.    Transfers Overall transfer level: Needs assistance Equipment used: Rolling walker (2 wheeled) Transfers: Sit to/from Stand Sit to Stand: Min guard         General transfer comment: Close guard for safety as pt powered up to full stand. VC's for hand placement on seated surface for safety.  Ambulation/Gait Ambulation/Gait assistance: Min guard Gait Distance (Feet): 250 Feet Assistive device: Rolling walker (2 wheeled);1 person hand held assist Gait Pattern/deviations: Step-through pattern;Decreased stride length;Trunk flexed Gait velocity: Decreased Gait velocity interpretation: <1.31 ft/sec, indicative of household ambulator General Gait Details: Grossly flexed trunk. Pt fatiguing quickly without RW use (wanted to try, provided HHA), appears more comfortable with RW and posture overall improved with RW this session.  Stairs            Wheelchair Mobility    Modified Rankin (Stroke Patients Only)       Balance Overall balance assessment: Needs assistance Sitting-balance support: No upper extremity supported;Feet supported Sitting balance-Leahy Scale: Good     Standing balance support: Bilateral upper extremity supported;During functional activity;No upper extremity supported Standing balance-Leahy Scale: Fair                               Pertinent Vitals/Pain Pain Assessment: Faces Faces Pain Scale: Hurts little more Pain Location: Back Pain Descriptors / Indicators: Sore Pain Intervention(s): Limited activity within patient's tolerance;Monitored during session;Repositioned  Home Living Family/patient expects to be discharged to:: Assisted living ([facility]) Living Arrangements:  Spouse/significant other             Home Equipment: Walker - 2 wheels;Shower seat - built in      Prior Function Level of Independence: Independent         Comments: ADLs and IADLs     Hand Dominance   Dominant Hand: Left    Extremity/Trunk Assessment   Upper Extremity Assessment Upper Extremity Assessment: Generalized weakness    Lower Extremity Assessment Lower Extremity Assessment: Generalized weakness    Cervical / Trunk Assessment Cervical / Trunk Assessment: Other exceptions Cervical / Trunk Exceptions: s/p back sx  Communication   Communication: No difficulties  Cognition Arousal/Alertness: Awake/alert Behavior During Therapy: WFL for tasks assessed/performed Overall Cognitive Status: History of cognitive impairments - at baseline                                 General Comments: Pt with memory deficits at baseline.      General Comments      Exercises     Assessment/Plan    PT Assessment Patient needs continued PT services  PT Problem List Decreased strength;Decreased activity tolerance;Decreased balance;Decreased mobility;Decreased knowledge of use of DME;Decreased safety awareness;Decreased knowledge of precautions;Pain       PT Treatment Interventions DME instruction;Gait training;Functional mobility training;Therapeutic activities;Therapeutic exercise;Neuromuscular re-education;Patient/family education    PT Goals (Current goals can be found in the Care Plan section)  Acute Rehab PT Goals Patient Stated Goal: Be able to walk without the walker PT Goal Formulation: With patient Time For Goal Achievement: 06/23/21 Potential to Achieve Goals: Good    Frequency Min 5X/week   Barriers to discharge        Co-evaluation               AM-PAC PT "6 Clicks" Mobility  Outcome Measure Help needed turning from your back to your side while in a flat bed without using bedrails?: A Little Help needed moving from lying on  your back to sitting on the side of a flat bed without using bedrails?: A Little Help needed moving to and from a bed to a chair (including a wheelchair)?: A Little Help needed standing up from a chair using your arms (e.g., wheelchair or bedside chair)?: A Little Help needed to walk in hospital room?: A Little Help needed climbing 3-5 steps with a railing? : A Little 6 Click Score: 18    End of Session Equipment Utilized During Treatment: Gait belt Activity Tolerance: Patient tolerated treatment well Patient left: in bed;with call bell/phone within reach Nurse Communication: Mobility status PT Visit Diagnosis: Unsteadiness on feet (R26.81);Pain Pain - part of body:  (back)    Time: [id number] PT Time Calculation (min) (ACUTE ONLY): 21 min   Charges:   PT Evaluation $PT Eval Low Complexity: 1 Low          Rolinda Roan, PT, DPT Acute Rehabilitation Services Pager: [phone] Office: [phone]   Thelma Comp 06/09/2021, 10:48 AM

## 2021-06-09 NOTE — Discharge Summary (Signed)
Physician Discharge Summary  Patient ID: Bianca Garcia MRN: [id number] DOB/AGE: May 09, 1941 80 y.o.  Admit date: 06/08/2021 Discharge date: 06/09/2021  Admission Diagnoses:  Discharge Diagnoses:  Active Problems:   Lumbar stenosis with neurogenic claudication   Discharged Condition: good  Hospital Course: Patient mated to the hospital where she underwent uncomplicated [id number] and [id number] decompressive surgery.  Postoperatively doing well.  Preoperative back and lower extreme pain much improved.  Standing ambulating and voiding without difficulty.  Patient ready for discharge home.  Consults:   Significant Diagnostic Studies:   Treatments:   Discharge Exam: Blood pressure (!) 111/55, pulse 63, temperature 98.2 F (36.8 C), temperature source Oral, resp. rate 16, height '5\' 1"'$  (1.549 m), weight 63.5 kg, last menstrual period 09/28/1979, SpO2 94 %. Awake and alert.  Oriented and appropriate.  Motor and sensory function intact.  Wound clean and dry.  Chest and abdomen benign.  Disposition: Discharge disposition: 01-Home or Self Care       Discharge Instructions     Face-to-face encounter (required for Medicare/Medicaid patients)   Complete by: As directed    I Charlie Pitter certify that this patient is under my care and that I, or a nurse practitioner or physician's assistant working with me, had a face-to-face encounter that meets the physician face-to-face encounter requirements with th is patient on 06/09/2021. The encounter with the patient was in whole, or in part for the following medical condition(s) which is the primary reason for home health care (List medical condition): Lumbar stenosis   The encounter with the patient was in whole, or in part, for the following medical condition, which is the primary reason for home health care: Lumbar stenosis   I certify that, based on my findings, the following services are medically necessary home health services: Physical therapy    Reason for Medically Necessary Home Health Services: Therapy- Therapeutic Exercises to Increase Strength and Endurance   My clinical findings support the need for the above services: Unsafe ambulation due to balance issues   Further, I certify that my clinical findings support that this patient is homebound due to: Unable to leave home safely without assistance   Home Health   Complete by: As directed    To provide the following care/treatments:  PT OT        Allergies as of 06/09/2021       Reactions   Fluticasone Other (See Comments)   DRY EYES   Aciphex [rabeprazole]    Critical    Codeine Nausea Only   Cymbalta [duloxetine Hcl]    Critical   Lexapro [escitalopram]    critical   Lipitor [atorvastatin Calcium]    Intolerant    Pneumovax [pneumococcal Polysaccharide Vaccine]    moderate   Prevacid [lansoprazole]    Unknown, listed on MAR   Rosuvastatin    Intolerant    Sertraline    critical   Statins    Critical   Sulfa Antibiotics    vomiting   Tetracyclines & Related    Morphine Sulfate   Welchol [colesevelam]    Unknown, listed on MAR   Pneumovax 23 [pneumococcal Vac Polyvalent] Rash   Injection site reaction        Medication List     TAKE these medications    acetaminophen 325 MG tablet Commonly known as: TYLENOL Take 650 mg by mouth See admin instructions. Take 650 mg 3 times daily, may take 650 mg 2 times daily as needed for pain  aspirin EC 81 MG tablet Take 81 mg by mouth daily. Swallow whole.   CALCIUM 500 + D PO Take 1 tablet by mouth daily.   calcium carbonate 500 MG chewable tablet Commonly known as: TUMS - dosed in mg elemental calcium Chew 2 tablets by mouth 2 (two) times daily as needed for indigestion or heartburn.   clonazePAM 0.5 MG tablet Commonly known as: KLONOPIN Take 0.25 mg by mouth 2 (two) times daily as needed (for anxiety and shakiness).   clopidogrel 75 MG tablet Commonly known as: PLAVIX Take 1 tablet (75 mg  total) by mouth daily.   donepezil 10 MG tablet Commonly known as: ARICEPT Take 10 mg by mouth daily.   gabapentin 100 MG capsule Commonly known as: NEURONTIN Take 100 mg by mouth daily in the afternoon.   gabapentin 300 MG capsule Commonly known as: NEURONTIN Take 1 capsule (300 mg total) by mouth in the morning and at bedtime.   HYDROcodone-acetaminophen 5-325 MG tablet Commonly known as: NORCO/VICODIN Take 1 tablet by mouth every 4 (four) hours as needed for moderate pain ((score 4 to 6)).   levothyroxine 88 MCG tablet Commonly known as: SYNTHROID Take 88 mcg by mouth daily before breakfast.   melatonin 5 MG Tabs Take 5 mg by mouth at bedtime as needed (sleep).   omeprazole 40 MG capsule Commonly known as: PRILOSEC Take 40 mg by mouth daily.   PRESERVISION AREDS PO Take 1 tablet by mouth 2 (two) times daily.   propranolol 40 MG tablet Commonly known as: INDERAL Take 1 tablet (40 mg total) by mouth 2 (two) times daily. Please call [phone] to schedule an appt.   rosuvastatin 5 MG tablet Commonly known as: CRESTOR Take 1 tablet (5 mg total) by mouth daily.         Signed: Cooper Render Autry Prust 06/09/2021, 10:54 AM

## 2021-06-09 NOTE — TOC Initial Note (Addendum)
Transition of Care [facility]) - Initial/Assessment Note    Patient Details  Name: Bianca Garcia MRN: [id number] Date of Birth: 16-Jan-1941  Transition of Care [facility]) CM/SW Contact:    Joanne Chars, LCSW Phone Number: 06/09/2021, 9:56 AM  Clinical Narrative:   CSW spoke with pt regarding recommendation for [facility].  Pt is from independent living at [facility], is planning on [facility] type services in her room, not planning on going to the rehab side of [facility].  Permission given to speak to husband.  PCP in place.    CSW was directed to [place] and [place] at [place], [place], waiting on return call.   1100: CSW spoke with Manuela Schwartz, Therapist, sports, at [facility]  They were planning on pt returning to rehab side, she will speak to RN on 3C and confirm this. 1115: CSW spoke with Manuela Schwartz at [place], plan is confirmed.  FL2 sent to [facility].  CSW left message with son Herbie [place], then spoke with son Herbie [place], plan is for rehab at [facility].  [facility] will send transportation to pick up pt.   1200: CSW spoke with [place] at [place] who confirmed she had DC summary, Lucianne Lei will pick up pt between 230-3pm.  RN on 3 C notified.                     Expected Discharge Plan: [place] (Pt is in [place] at [facility]) Barriers to Discharge: No Barriers Identified   Patient Goals and CMS Choice Patient states their goals for this hospitalization and ongoing recovery are:: "back to normal" CMS Medicare.gov Compare Post Acute Care list provided to::  (na)    Expected Discharge Plan and Services Expected Discharge Plan: [place] (Pt is in [place] at [facility]) In-house Referral: Clinical Social Work   Post Acute Care Choice: [place] arrangements for the past 2 months: [place]                 DME Arranged: N/A (DME through [facility] staff)                    Prior Living Arrangements/Services Living arrangements for the past  2 months: [place] Lives with:: Spouse Patient language and need for interpreter reviewed:: Yes Do you feel safe going back to the place where you live?: Yes      Need for Family Participation in Patient Care: No (Comment) Care giver support system in place?: Yes (comment) Current home services: Other (comment) (na) Criminal Activity/Legal Involvement Pertinent to Current Situation/Hospitalization: No - Comment as needed  Activities of Daily Living Home Assistive Devices/Equipment: None ADL Screening (condition at time of admission) Patient's cognitive ability adequate to safely complete daily activities?: Yes Is the patient deaf or have difficulty hearing?: No Does the patient have difficulty seeing, even when wearing glasses/contacts?: No Does the patient have difficulty concentrating, remembering, or making decisions?: No Patient able to express need for assistance with ADLs?: Yes Does the patient have difficulty dressing or bathing?: No Independently performs ADLs?: Yes (appropriate for developmental age) Does the patient have difficulty walking or climbing stairs?: Yes Weakness of Legs: Both Weakness of Arms/Hands: None  Permission Sought/Granted Permission sought to share information with : Family Supports Permission granted to share information with : Yes, Verbal Permission Granted  Share Information with NAME: husband Herbie [place]  Permission granted to share info w AGENCY: [facility]        Emotional Assessment Appearance::  Appears stated age Attitude/Demeanor/Rapport: Engaged Affect (typically observed): Appropriate, Pleasant Orientation: : Oriented to Self, Oriented to Place Alcohol / Substance Use: Other (comment) Psych Involvement: No (comment)  Admission diagnosis:  Lumbar stenosis with neurogenic claudication [M48.062] Patient Active Problem List   Diagnosis Date Noted   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   Vascular dementia  without behavioral disturbance ([place]) 05/18/2021   Orthostatic hypotension 01/19/2018   Cerebral embolism with cerebral infarction 09/20/2017   Stroke (cerebrum) ([place]) 09/20/2017   Paresthesia 08/01/2017   Tremor 08/01/2017   Anxiety 02/27/2016   IBS (irritable bowel syndrome) 02/27/2016   Coronary artery calcification 02/20/2016   Hypothyroidism 06/09/2015   Right rotator cuff tear 01/05/2013   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 [address]Phone: [phone]: [phone]    Social Determinants of Health (SDOH) Interventions    Readmission Risk Interventions No flowsheet data found.

## 2021-06-09 NOTE — Discharge Instructions (Addendum)
Wound Care Keep incision covered and dry for two days.  Do not put any creams, lotions, or ointments on incision. Leave steri-strips on back.  They will fall off by themselves. Activity Walk each and every day, increasing distance each day. No lifting greater than 5 lbs.  Avoid excessive back bending  May ride as a passenger locally.  Diet Resume your normal diet.   Call Your Doctor If Any of These Occur Redness, drainage, or swelling at the wound.  Temperature greater than 101 degrees. Severe pain not relieved by pain medication. Incision starts to come apart. Follow Up Appt Call today for appointment in 1-2 weeks [id number]) or for problems.

## 2021-06-09 NOTE — Evaluation (Signed)
Occupational Therapy Evaluation Patient Details Name: Bianca Garcia MRN: [id number] DOB: [birth year] Today's Date: 06/09/2021   History of Present Illness 80 yo female s/p L4-S1 decompressive lami on 9/12. PMH including anxiety, arthritis, DJD, DDD, HTN, hypothyroidism, memory loss, R rotator cuff tear (2014), CVA, and ACDF (2016).   Clinical Impression   PTA, pt was living with her husband and was independent with ADLs. Currently, pt requires Supervision-Min Guard A for ADLs and functional mobility using RW; requiring Mod cues for adherence to back precautions. Provided education on LB ADLs, toileting, and shower transfer; pt demonstrated understanding. Pt presenting with decreased balance, strength, and ST memory. Pt would benefit from further acute OT to facilitate safe dc. Recommend dc to home with HHOT for further OT to optimize safety, independence with ADLs, and return to PLOF.      Recommendations for follow up therapy are one component of a multi-disciplinary discharge planning process, led by the attending physician.  Recommendations may be updated based on patient status, additional functional criteria and insurance authorization.   Follow Up Recommendations  Home health OT;Supervision/Assistance - 24 hour    Equipment Recommendations  None recommended by OT    Recommendations for Other Services PT consult     Precautions / Restrictions Precautions Precautions: Fall;Back Precaution Booklet Issued: Yes (comment) Precaution Comments: Reviewed back precautions Required Braces or Orthoses: Other Brace Other Brace: No brace per MD order      Mobility Bed Mobility Overal bed mobility: Needs Assistance Bed Mobility: Rolling;Sidelying to Sit;Sit to Sidelying Rolling: Min guard Sidelying to sit: Min guard     Sit to sidelying: Min guard General bed mobility comments: Close Min guard A and Min cues for log rol ltechnique    Transfers Overall transfer level: Needs  assistance Equipment used: Rolling walker (2 wheeled) Transfers: Sit to/from Stand Sit to Stand: Min guard         General transfer comment: MIn Guard A for safety    Balance Overall balance assessment: Needs assistance Sitting-balance support: No upper extremity supported;Feet supported Sitting balance-Leahy Scale: Good     Standing balance support: Bilateral upper extremity supported;During functional activity;No upper extremity supported Standing balance-Leahy Scale: Fair                             ADL either performed or assessed with clinical judgement   ADL Overall ADL's : Needs assistance/impaired Eating/Feeding: Set up;Sitting   Grooming: Set up;Sitting   Upper Body Bathing: Supervision/ safety;Set up;Sitting   Lower Body Bathing: Min guard;Sit to/from stand   Upper Body Dressing : Supervision/safety;Set up;Sitting Upper Body Dressing Details (indicate cue type and reason): pt donning her shirt Lower Body Dressing: Min guard;Sit to/from stand;Cueing for sequencing;Cueing for compensatory techniques Lower Body Dressing Details (indicate cue type and reason): Min Guard A for safety and Mod cues for adhering to back precautions Toilet Transfer: Min guard;Ambulation;RW       Tub/ Shower Transfer: Min guard;Ambulation;Walk-in shower;Rolling walker   Functional mobility during ADLs: Min guard;Rolling walker General ADL Comments: Pt performing ADLs and functional mobiltiy at Supervision- [employer] A level. Providing education on compensatory techniques for ADLs and fucntional transfers. Pt donning her clothes and performing shower transfer with Mod cues for back precautions     Vision         Perception     Praxis      Pertinent Vitals/Pain Pain Assessment: Faces Faces Pain Scale: Hurts a little  bit Pain Location: Back Pain Descriptors / Indicators: Sore Pain Intervention(s): Monitored during session     Hand Dominance     Extremity/Trunk  Assessment Upper Extremity Assessment Upper Extremity Assessment: Generalized weakness   Lower Extremity Assessment Lower Extremity Assessment: Defer to PT evaluation   Cervical / Trunk Assessment Cervical / Trunk Assessment: Other exceptions Cervical / Trunk Exceptions: s/p back sx   Communication Communication Communication: No difficulties   Cognition Arousal/Alertness: Awake/alert Behavior During Therapy: WFL for tasks assessed/performed Overall Cognitive Status: History of cognitive impairments - at baseline                                 General Comments: Pt with memory deficits at baseline.   General Comments       Exercises     Shoulder Instructions      Home Living Family/patient expects to be discharged to:: Assisted living ([facility]) Living Arrangements: Spouse/significant other                           Home Equipment: Walker - 2 wheels;Shower seat - built in          Prior Functioning/Environment Level of Independence: Independent        Comments: ADLs and IADLs        OT Problem List: Decreased range of motion;Decreased activity tolerance;Impaired balance (sitting and/or standing);Decreased knowledge of precautions;Decreased knowledge of use of DME or AE      OT Treatment/Interventions: Self-care/ADL training;Therapeutic exercise;Energy conservation;DME and/or AE instruction;Therapeutic activities;Patient/family education    OT Goals(Current goals can be found in the care plan section) Acute Rehab OT Goals Patient Stated Goal: Go home OT Goal Formulation: With patient Time For Goal Achievement: 06/23/21 Potential to Achieve Goals: Good  OT Frequency: Min 3X/week   Barriers to D/C:            Co-evaluation              AM-PAC OT "6 Clicks" Daily Activity     Outcome Measure Help from another person eating meals?: A Little Help from another person taking care of personal grooming?: A Little Help  from another person toileting, which includes using toliet, bedpan, or urinal?: A Little Help from another person bathing (including washing, rinsing, drying)?: A Little Help from another person to put on and taking off regular upper body clothing?: A Little Help from another person to put on and taking off regular lower body clothing?: A Little 6 Click Score: 18   End of Session Equipment Utilized During Treatment: Rolling walker;Gait belt Nurse Communication: Mobility status  Activity Tolerance: Patient tolerated treatment well Patient left: in bed;with call bell/phone within reach  OT Visit Diagnosis: Unsteadiness on feet (R26.81);Other abnormalities of gait and mobility (R26.89);Muscle weakness (generalized) (M62.81);Pain Pain - part of body:  (back)                Time: [id number] OT Time Calculation (min): 21 min Charges:  OT General Charges $OT Visit: 1 Visit OT Evaluation $OT Eval Low Complexity: [place], OTR/L Acute Rehab Pager: [phone] Office: [place] 06/09/2021, 8:57 AM

## 2021-06-09 NOTE — Progress Notes (Signed)
Patient alert and oriented,with pleasantly  confusion noted,voiding adequately, skin clean, dry and intact without evidence of skin break down, or symptoms of complications - no redness or edema noted, only slight tenderness at site.  Patient states pain is manageable at time of discharge. Patient has an appointment with MD in 2 weeks. Patient being discharged to [facility]. Patient to be transported by facility transport. Discharge instructions and prescription information placed in the packet for discharge.  Report called to receiving Nurse at [facility].Marland Kitchen

## 2021-06-09 NOTE — TOC Transition Note (Signed)
Transition of Care [facility]) - CM/SW Discharge Note   Patient Details  Name: Bianca Garcia MRN: [id number] Date of Birth: 06-05-1941  [facility]) CM/SW Contact:  Joanne Chars, LCSW Phone Number: 06/09/2021, 12:34 PM   Clinical Narrative:   Pt discharging to [facility]/rehab unit.  RN call report to [phone].    Final next level of care: Skilled Nursing Facility Barriers to Discharge: No Barriers Identified   Patient Goals and CMS Choice Patient states their goals for this hospitalization and ongoing recovery are:: "back to normal" CMS Medicare.gov Compare Post Acute Care list provided to::  (na)    Discharge Placement              Patient chooses bed at:  [facility]) Patient to be transferred to facility by: [facility] transportation Name of family member notified: son Herbie [place] Patient and family notified of of transfer: 06/09/21  Discharge Plan and Services In-house Referral: Clinical Social Work   Post Acute Care Choice: Home Health          DME Arranged: N/A (DME through [facility] staff)                    Social Determinants of Health (SDOH) Interventions     Readmission Risk Interventions No flowsheet data found.

## 2021-06-09 NOTE — NC FL2 (Signed)
[place] LEVEL OF CARE SCREENING TOOL     IDENTIFICATION  Patient Name: Bianca Garcia Birthdate: Feb 26, 1941 Sex: female Admission Date (Current Location): 06/08/2021  [facility] and [place] Number:  Herbalist and Address:  [facility] [place] [place] [address] [place], [place] [address]      Provider Number: [id number]  Attending Physician Name and Address:  Earnie Larsson, MD  Relative Name and Phone Number:  Sarika, H[place]t [phone]    Current Level of Care: Hospital Recommended Level of Care: [place] Prior Approval Number:    Date Approved/Denied:   PASRR Number: [id number]  Discharge Plan: SNF    Current Diagnoses: Patient Active Problem List   Diagnosis Date Noted   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   Vascular dementia without behavioral disturbance ([place]) 05/18/2021   Orthostatic hypotension 01/19/2018   Cerebral embolism with cerebral infarction 09/20/2017   Stroke (cerebrum) ([place]) 09/20/2017   Paresthesia 08/01/2017   Tremor 08/01/2017   Anxiety 02/27/2016   IBS (irritable bowel syndrome) 02/27/2016   Coronary artery calcification 02/20/2016   Hypothyroidism 06/09/2015   Right rotator cuff tear 01/05/2013   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   Biceps tendonitis 01/05/2013   [place], vitreous 03/21/2012   Macular degeneration 03/21/2012   Essential hypertension 12/22/2011   Hyperlipidemia 12/22/2011    Orientation RESPIRATION BLADDER Height & Weight     Self  Normal Incontinent Weight: 139 lb 15.9 oz (63.5 kg) Height:  '5\' 1"'$  (154.9 cm)  BEHAVIORAL SYMPTOMS/MOOD NEUROLOGICAL BOWEL NUTRITION STATUS      Continent Diet (see discharge summary)  AMBULATORY STATUS COMMUNICATION OF NEEDS Skin   Independent Verbally Surgical wounds                       Personal Care Assistance Level of Assistance  Bathing, Feeding, Dressing Bathing Assistance: Independent Feeding  assistance: Independent Dressing Assistance: Independent     Functional Limitations Info  Sight, Hearing, Speech Sight Info: Adequate Hearing Info: Adequate Speech Info: Adequate    SPECIAL CARE FACTORS FREQUENCY  PT (By licensed PT), OT (By licensed OT)     PT Frequency: 5x week OT Frequency: 5x week            Contractures Contractures Info: Not present    Additional Factors Info  Code Status, Allergies Code Status Info: full Allergies Info: Fluticasone, Aciphex (Rabeprazole), Codeine, Cymbalta (Duloxetine Hcl), Lexapro (Escitalopram), Lipitor (Atorvastatin Calcium), Pneumovax (Pneumococcal Polysaccharide Vaccine), Prevacid (Lansoprazole), Rosuvastatin, Sertraline, Statins, Sulfa Antibiotics, Tetracyclines & Related, Welchol (Colesevelam), Pneumovax 23 (Pneumococcal Vac Polyvalent)           Current Medications (06/09/2021):  This is the current hospital active medication list Current Facility-Administered Medications  Medication Dose Route Frequency Provider Last Rate Last Admin   0.9 %  sodium chloride infusion  250 mL Intravenous Continuous Earnie Larsson, MD 1 mL/hr at 06/08/21 1808 250 mL at 06/08/21 1808   acetaminophen (TYLENOL) tablet 650 mg  650 mg Oral Q4H PRN Earnie Larsson, MD   650 mg at 06/09/21 [id number]   Or   acetaminophen (TYLENOL) suppository 650 mg  650 mg Rectal Q4H PRN Earnie Larsson, MD       aspirin EC tablet 81 mg  81 mg Oral Daily Earnie Larsson, MD   81 mg at 06/09/21 0905   calcium carbonate (TUMS - dosed in mg elemental calcium) chewable tablet 400 mg of elemental calcium  2 tablet Oral BID  PRN Earnie Larsson, MD       calcium-vitamin D (OSCAL WITH D) 500-200 MG-UNIT per tablet 1 tablet  1 tablet Oral Q breakfast Earnie Larsson, MD   1 tablet at 06/09/21 0905   clonazePAM (KLONOPIN) disintegrating tablet 0.25 mg  0.25 mg Oral BID PRN Earnie Larsson, MD       cyclobenzaprine (FLEXERIL) tablet 10 mg  10 mg Oral TID PRN Earnie Larsson, MD       donepezil (ARICEPT) tablet 10  mg  10 mg Oral Daily Earnie Larsson, MD   10 mg at 06/09/21 0905   gabapentin (NEURONTIN) capsule 100 mg  100 mg Oral Q1500 Earnie Larsson, MD   100 mg at 06/08/21 1820   gabapentin (NEURONTIN) capsule 300 mg  300 mg Oral BID Earnie Larsson, MD   300 mg at 06/09/21 0859   hydrALAZINE (APRESOLINE) injection 10 mg  10 mg Intravenous Q6H PRN Meyran, Ocie Cornfield, NP       HYDROcodone-acetaminophen ([place]) 10-325 MG per tablet 1-2 tablet  1-2 tablet Oral Q4H PRN Earnie Larsson, MD       HYDROcodone-acetaminophen (NORCO/VICODIN) 5-325 MG per tablet 1 tablet  1 tablet Oral Q4H PRN Earnie Larsson, MD   1 tablet at 06/08/21 2159   HYDROmorphone (DILAUDID) injection 1 mg  1 mg Intravenous Q2H PRN Earnie Larsson, MD       ketorolac (TORADOL) 15 MG/ML injection 15 mg  15 mg Intravenous Q6H Earnie Larsson, MD   15 mg at 06/09/21 [id number]   levothyroxine (SYNTHROID) tablet 88 mcg  88 mcg Oral QAC breakfast Earnie Larsson, MD   88 mcg at 06/09/21 [id number]   melatonin tablet 5 mg  5 mg Oral QHS PRN Earnie Larsson, MD       menthol-cetylpyridinium (CEPACOL) lozenge 3 mg  1 lozenge Oral PRN Earnie Larsson, MD       Or   phenol (CHLORASEPTIC) mouth spray 1 spray  1 spray Mouth/Throat PRN Earnie Larsson, MD       multivitamin (PROSIGHT) tablet 1 tablet  1 tablet Oral BID Earnie Larsson, MD   1 tablet at 06/09/21 0905   omeprazole (PRILOSEC) capsule 40 mg  40 mg Oral Daily Earnie Larsson, MD   40 mg at 06/09/21 0902   ondansetron (ZOFRAN) tablet 4 mg  4 mg Oral Q6H PRN Earnie Larsson, MD       Or   ondansetron [facility]) injection 4 mg  4 mg Intravenous Q6H PRN Earnie Larsson, MD       propranolol (INDERAL) tablet 40 mg  40 mg Oral BID Earnie Larsson, MD   40 mg at 06/09/21 0904   rosuvastatin (CRESTOR) tablet 5 mg  5 mg Oral Daily Earnie Larsson, MD   5 mg at 06/09/21 0905   sodium chloride flush (NS) 0.9 % injection 3 mL  3 mL Intravenous Q12H Earnie Larsson, MD       sodium chloride flush (NS) 0.9 % injection 3 mL  3 mL Intravenous PRN Earnie Larsson, MD          Discharge Medications: Please see discharge summary for a list of discharge medications.  Relevant Imaging Results:  Relevant Lab Results:   Additional Information SSN: [id number]. Pt is vaccinated for covid with one booster.  Joanne Chars, LCSW

## 2021-06-09 NOTE — Plan of Care (Signed)
Adequately Ready for Discharge to facility

## 2021-06-10 ENCOUNTER — Other Ambulatory Visit: Payer: Self-pay

## 2021-06-10 ENCOUNTER — Non-Acute Institutional Stay (SKILLED_NURSING_FACILITY): Payer: Medicare Other | Admitting: Orthopedic Surgery

## 2021-06-10 ENCOUNTER — Encounter: Payer: Self-pay | Admitting: Orthopedic Surgery

## 2021-06-10 DIAGNOSIS — R2689 Other abnormalities of gait and mobility: Secondary | ICD-10-CM | POA: Diagnosis not present

## 2021-06-10 DIAGNOSIS — E782 Mixed hyperlipidemia: Secondary | ICD-10-CM

## 2021-06-10 DIAGNOSIS — R278 Other lack of coordination: Secondary | ICD-10-CM | POA: Diagnosis not present

## 2021-06-10 DIAGNOSIS — F0150 Vascular dementia without behavioral disturbance: Secondary | ICD-10-CM

## 2021-06-10 DIAGNOSIS — I700 Atherosclerosis of aorta: Secondary | ICD-10-CM

## 2021-06-10 DIAGNOSIS — F419 Anxiety disorder, unspecified: Secondary | ICD-10-CM

## 2021-06-10 DIAGNOSIS — M5459 Other low back pain: Secondary | ICD-10-CM | POA: Diagnosis not present

## 2021-06-10 DIAGNOSIS — M48062 Spinal stenosis, lumbar region with neurogenic claudication: Secondary | ICD-10-CM | POA: Diagnosis not present

## 2021-06-10 DIAGNOSIS — M62562 Muscle wasting and atrophy, not elsewhere classified, left lower leg: Secondary | ICD-10-CM | POA: Diagnosis not present

## 2021-06-10 DIAGNOSIS — E039 Hypothyroidism, unspecified: Secondary | ICD-10-CM

## 2021-06-10 DIAGNOSIS — I10 Essential (primary) hypertension: Secondary | ICD-10-CM | POA: Diagnosis not present

## 2021-06-10 DIAGNOSIS — M62561 Muscle wasting and atrophy, not elsewhere classified, right lower leg: Secondary | ICD-10-CM | POA: Diagnosis not present

## 2021-06-10 DIAGNOSIS — R251 Tremor, unspecified: Secondary | ICD-10-CM | POA: Diagnosis not present

## 2021-06-10 DIAGNOSIS — Z9889 Other specified postprocedural states: Secondary | ICD-10-CM

## 2021-06-10 DIAGNOSIS — Z4789 Encounter for other orthopedic aftercare: Secondary | ICD-10-CM | POA: Diagnosis not present

## 2021-06-10 MED ORDER — ROSUVASTATIN CALCIUM 5 MG PO TABS: 5.0000 mg | ORAL_TABLET | Freq: Every day | ORAL | 3 refills | Status: DC

## 2021-06-10 NOTE — Progress Notes (Signed)
Location:  [facility] Room Number: 152-A Place of Service:    Provider: Windell Moulding, NP  Code Status: DNR Goals of Care:  Advanced Directives 06/10/2021  Does Patient Have a Medical Advance Directive? Yes  Type of Paramedic of [place];Living will;Out of facility DNR (pink MOST or yellow form)  Does patient want to make changes to medical advance directive? No - Patient declined  Copy of [place] in Chart? Yes - validated most recent copy scanned in chart (See row information)  Would patient like information on creating a medical advance directive? -  Pre-existing out of facility DNR order (yellow form or pink MOST form) -     Chief Complaint  Patient presents with   Hospitalization Follow-up    Hospital Follow Up.    HPI: Patient is a 80 y.o. female seen today for hospital follow-up s/p admission from [facility] 09/12-09/13.   09/12 she underwent L4-5, L5-S1 decompressive laminectomy with foraminotomies by Dr. Earnie Larsson due to unresolved back pain with radiation to lower extremities. She tolerated procedure well and was discharged to the rehabilitation unit at [facility] for additional PT/OT.  Today, she reports minimal pain to back. Denies radiation of paint o lower extremities. She has been given tylenol for pain. Using walker to ambulate, remains one person assist. She has a history of short term memory loss and has been noncompliant with using call bell. Staff reports she has been getting up on her own at times without walker. Reported fall 09/13, no apparent injury.   HTN- BUN/creat 11/0.7 06/04/2021, controlled with propranolol Dementia- MMSE 24/30 05/16/2021, MRI brain 2019 revealed age-related cerebral atrophy with chronic small vessel ischemic disease, redirects easily, needs lots of reminders Hypothyroidism- TSH 0.94 04/14/2021, levothyroxine 88 mcg daily HLD- LDL 99 04/14/2021, remains  on Crestor daily Tremor- remains on propranolol Anxiety- no recent panic attacks, very pleasant today, clonazepam prn  Recent blood pressures:  09/14- 131/80  09/13- 115/65, 154/71  Nurse does not report any other concerns, vitals stable.    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 Street of back injections by Dr. Brien Few 2016 with good relief, history of cervical DD with possiblity of surgery by Dr. Trenton Gammon.   GERD (gastroesophageal reflux disease)    IBS with moderate refluc seen on upper GI study form January 2011 Dr. Watt Climes   HTN (hypertension)    no meds   Hyperlipidemia    Hypothyroidism    IBS (irritable bowel syndrome)    Imbalance    Impingement syndrome of right shoulder 01/05/2013   Mass of left side of neck    Memory loss    Mood disorder ([place])    MVP (mitral valve prolapse)    Hx of    Osteopenia    Peripheral edema    Right rotator cuff tear 01/05/2013   Sciatic pain    recurrent   Seasonal allergies    Stroke [facility])    Tension headache    Tremor     Past Surgical History:  Procedure Laterality Date   ANTERIOR CERVICAL DECOMP/DISCECTOMY FUSION  04/2015    Dr. Pamala Hurry, [place]  2004   which revealed  smooth and normal coronary arteries   CARPAL TUNNEL RELEASE  2000   lt   CATARACT EXTRACTION W/ INTRAOCULAR LENS  IMPLANT, BILATERAL  1/17   Dr. Lucita Ferrara   COSMETIC SURGERY  2010   eyes-facial   DENTAL SURGERY  1960   LASIK     LOOP RECORDER INSERTION N/A 09/22/2017   Procedure: LOOP RECORDER INSERTION;  Surgeon: Constance Haw, MD;  Location: [place] [facility];  Service: Cardiovascular;  Laterality: N/A;   LUMBAR LAMINECTOMY/DECOMPRESSION MICRODISCECTOMY N/A 06/08/2021   Procedure: Laminectomy  and Foraminotomy - L4-L5, L5-S1;  Surgeon: Earnie Larsson, MD;  Location: [place];  Service: Neurosurgery;  Laterality: N/A;   SHOULDER ARTHROSCOPY WITH ROTATOR CUFF REPAIR Right 01/05/2013   Procedure: SHOULDER ARTHROSCOPY WITH ARTHROSCOPIC  ROTATOR CUFF REPAIR, ACROMIOPLASTY, EXTENSIVE DEBRIDEMENT;  Surgeon: Johnny Bridge, MD;  Location: [place];  Service: Orthopedics;  Laterality: Right;   TEE WITHOUT CARDIOVERSION N/A 09/22/2017   Procedure: TRANSESOPHAGEAL ECHOCARDIOGRAM (TEE);  Surgeon: Sanda Klein, MD;  Location: [facility];  Service: Cardiovascular;  Laterality: N/A;   TONSILLECTOMY     TUBAL LIGATION      Allergies  Allergen Reactions   Fluticasone Other (See Comments)    DRY EYES   Aciphex [Rabeprazole]     Critical    Codeine Nausea Only   Cymbalta [Duloxetine Hcl]     Critical    Lexapro [Escitalopram]     critical   Lipitor [Atorvastatin Calcium]     Intolerant    Pneumovax [Pneumococcal Polysaccharide Vaccine]     moderate   Prevacid [Lansoprazole]     Unknown, listed on MAR   Rosuvastatin     Intolerant    Sertraline     critical   Statins     Critical    Sulfa Antibiotics     vomiting   Tetracyclines & Related     Morphine Sulfate   Welchol [Colesevelam]     Unknown, listed on MAR   Pneumovax 23 [Pneumococcal Vac Polyvalent] Rash    Injection site reaction    Outpatient Encounter Medications as of 06/10/2021  Medication Sig   acetaminophen (TYLENOL) 325 MG tablet Take 650 mg by mouth See admin instructions. Take 650 mg 3 times daily, may take 650 mg 2 times daily as needed for pain   aspirin EC 81 MG tablet Take 81 mg by mouth daily. Swallow whole.   Calcium Carb-Cholecalciferol (CALCIUM 500 + D PO) Take 1 tablet by mouth daily.   calcium carbonate (TUMS - DOSED IN MG ELEMENTAL CALCIUM) 500 MG chewable tablet Chew 2 tablets by mouth 2 (two) times daily as needed for indigestion or heartburn.   clonazePAM (KLONOPIN) 0.5 MG tablet  Take 0.25 mg by mouth 2 (two) times daily as needed (for anxiety and shakiness).   clopidogrel (PLAVIX) 75 MG tablet Take 1 tablet (75 mg total) by mouth daily.   donepezil (ARICEPT) 10 MG tablet Take 10 mg by mouth daily.   gabapentin (NEURONTIN) 100 MG capsule Take 100 mg by mouth daily in the afternoon.   gabapentin (NEURONTIN) 300 MG capsule Take 1 capsule (300 mg total) by mouth in the morning and at bedtime.   HYDROcodone-acetaminophen (NORCO/VICODIN) 5-325 MG tablet Take 1 tablet by mouth every 4 (four) hours as needed for moderate pain ((score 4 to 6)).   levothyroxine (SYNTHROID) 88 MCG tablet Take 88 mcg by mouth daily before breakfast.   melatonin 5 MG TABS Take 5 mg by mouth at bedtime  as needed (sleep).   Multiple Vitamins-Minerals (PRESERVISION AREDS PO) Take 1 tablet by mouth 2 (two) times daily.    omeprazole (PRILOSEC) 40 MG capsule Take 40 mg by mouth daily.   propranolol (INDERAL) 40 MG tablet Take 1 tablet (40 mg total) by mouth 2 (two) times daily. Please call [phone] to schedule an appt.   rosuvastatin (CRESTOR) 5 MG tablet Take 1 tablet (5 mg total) by mouth daily.   No facility-administered encounter medications on file as of 06/10/2021.    Review of Systems:  Review of Systems  Unable to perform ROS: Dementia   Health Maintenance  Topic Date Due   Zoster Vaccines- Shingrix (1 of 2) 09/30/1959   COVID-19 Vaccine (4 - Booster) 10/30/2020   INFLUENZA VACCINE  04/27/2021   TETANUS/TDAP  08/18/2022   DEXA SCAN  Completed   PNA vac Low Risk Adult  Completed   HPV VACCINES  Aged Out    Physical Exam: Vitals:   06/10/21 1600  BP: 131/80  Pulse: 66  Resp: 16  Temp: 99.1 F (37.3 C)  SpO2: 96%  Weight: 143 lb 9.6 oz (65.1 kg)  Height: '5\' 1"'$  (1.549 m)   Body mass index is 27.13 kg/m. Physical Exam Vitals reviewed.  Constitutional:      General: She is not in acute distress. HENT:     Head: Normocephalic.     Right Ear: There is no impacted cerumen.      Left Ear: There is no impacted cerumen.     Nose: Nose normal.     Mouth/Throat:     Mouth: Mucous membranes are moist.  Eyes:     General:        Right eye: No discharge.        Left eye: No discharge.  Neck:     Vascular: No carotid bruit.  Cardiovascular:     Rate and Rhythm: Normal rate and regular rhythm.     Pulses: Normal pulses.     Heart sounds: Normal heart sounds. No murmur heard. Pulmonary:     Effort: Pulmonary effort is normal. No respiratory distress.     Breath sounds: Normal breath sounds. No wheezing.  Abdominal:     General: Bowel sounds are normal. There is no distension.     Palpations: Abdomen is soft.     Tenderness: There is no abdominal tenderness.  Musculoskeletal:     Cervical back: Normal and normal range of motion.     Thoracic back: Normal.     Lumbar back: No swelling, edema or tenderness. Normal range of motion.     Right lower leg: No edema.     Left lower leg: No edema.  Lymphadenopathy:     Cervical: No cervical adenopathy.  Skin:    General: Skin is warm and dry.     Capillary Refill: Capillary refill takes less than 2 seconds.     Comments: Honeycomb dressing to lower back, some dried blood present, no swelling, erythema or warmth noted. Surrounding skin intact.   Neurological:     General: No focal deficit present.     Mental Status: She is alert. Mental status is at baseline.     Motor: Weakness present.     Gait: Gait abnormal.     Comments: walker  Psychiatric:        Mood and Affect: Mood normal.        Behavior: Behavior normal.        Cognition and Memory: Memory  is impaired.    Labs reviewed: Basic Metabolic Panel: Recent Labs    08/14/20 0000 02/09/21 0000 04/14/21 0000  NA  --  143  --   K  --  4.3  --   CL  --  105  --   CO2  --  23*  --   BUN  --  10  --   CREATININE  --  0.8  --   CALCIUM  --  9.4  --   TSH 4.59 0.13* 0.94   Liver Function Tests: No results for input(s): AST, ALT, ALKPHOS, BILITOT, PROT,  ALBUMIN in the last 8760 hours. No results for input(s): LIPASE, AMYLASE in the last 8760 hours. No results for input(s): AMMONIA in the last 8760 hours. CBC: Recent Labs    02/09/21 0000  WBC 4.8  HGB 14.4  HCT 41  PLT 272   Lipid Panel: Recent Labs    04/14/21 0000  CHOL 191  HDL 61  LDLCALC 99  TRIG 156   Lab Results  Component Value Date   HGBA1C 5.6 09/21/2017    Procedures since last visit: No results found.  Assessment/Plan 1. S/P lumbar laminectomy - followed by Dr. Annette Stable - decompression L4-5, L5-S1 09/12 - minimal pain, no radiation to lower extremities - ambulating with walker  - cont PT/OT - avoid bending, lifting and pulling movements - cont tylenol for pain  2. Essential hypertension - controlled - cont propranolol  3. Vascular dementia without behavioral disturbance ([place]) - MMSE 24/30 04/2021 - MRI brain 2019 revealed age-related cerebral atrophy with chronic small vessel ischemic disease - impaired short term memory, redirects easily - encourage reminders to help with compliance - recommend using tylenol for surgical pain  4. Acquired hypothyroidism - TSH 0.94 04/14/2021 -levothyroxine 88 mcg daily  5. Mixed hyperlipidemia -LDL 99 04/14/2021,  - remains on crestor daily  6. Tremor - not observed today - cont propranolol  7. Anxiety - no recent panic attacks - calm today - cont clonazepam prn  8. Aortic atherosclerosis ([place]) - CT neck 2018 noted atherosclerosis - remains on aspirin and statin    Labs/tests ordered:  none Next appt:  09/16/2021

## 2021-06-10 NOTE — Telephone Encounter (Signed)
Patient pharmacy [facility] has request refill on medication "Rosuvastatin '5mg'$ ". Patient last refill was 03/04/2020. Patient medication has 3 Allergy Contraindications. Medication pend and sent to Windell Moulding, NP for approval.

## 2021-06-11 ENCOUNTER — Other Ambulatory Visit: Payer: Self-pay | Admitting: Adult Health

## 2021-06-11 DIAGNOSIS — R2689 Other abnormalities of gait and mobility: Secondary | ICD-10-CM | POA: Diagnosis not present

## 2021-06-11 DIAGNOSIS — M62561 Muscle wasting and atrophy, not elsewhere classified, right lower leg: Secondary | ICD-10-CM | POA: Diagnosis not present

## 2021-06-11 DIAGNOSIS — M48062 Spinal stenosis, lumbar region with neurogenic claudication: Secondary | ICD-10-CM | POA: Diagnosis not present

## 2021-06-11 DIAGNOSIS — Z4789 Encounter for other orthopedic aftercare: Secondary | ICD-10-CM | POA: Diagnosis not present

## 2021-06-11 DIAGNOSIS — F0150 Vascular dementia without behavioral disturbance: Secondary | ICD-10-CM | POA: Diagnosis not present

## 2021-06-11 DIAGNOSIS — R278 Other lack of coordination: Secondary | ICD-10-CM | POA: Diagnosis not present

## 2021-06-11 DIAGNOSIS — M62562 Muscle wasting and atrophy, not elsewhere classified, left lower leg: Secondary | ICD-10-CM | POA: Diagnosis not present

## 2021-06-11 DIAGNOSIS — M5459 Other low back pain: Secondary | ICD-10-CM | POA: Diagnosis not present

## 2021-06-11 MED ORDER — TRAMADOL HCL 50 MG PO TABS: 50.0000 mg | ORAL_TABLET | Freq: Three times a day (TID) | ORAL | 0 refills | Status: AC | PRN

## 2021-06-11 NOTE — Progress Notes (Signed)
Nurse notified met that the resident's POA would like to avoid norco and try something milder for pain.

## 2021-06-12 DIAGNOSIS — Z4789 Encounter for other orthopedic aftercare: Secondary | ICD-10-CM | POA: Diagnosis not present

## 2021-06-12 DIAGNOSIS — R278 Other lack of coordination: Secondary | ICD-10-CM | POA: Diagnosis not present

## 2021-06-12 DIAGNOSIS — M5459 Other low back pain: Secondary | ICD-10-CM | POA: Diagnosis not present

## 2021-06-12 DIAGNOSIS — M62561 Muscle wasting and atrophy, not elsewhere classified, right lower leg: Secondary | ICD-10-CM | POA: Diagnosis not present

## 2021-06-12 DIAGNOSIS — F0150 Vascular dementia without behavioral disturbance: Secondary | ICD-10-CM | POA: Diagnosis not present

## 2021-06-12 DIAGNOSIS — R2689 Other abnormalities of gait and mobility: Secondary | ICD-10-CM | POA: Diagnosis not present

## 2021-06-12 DIAGNOSIS — M62562 Muscle wasting and atrophy, not elsewhere classified, left lower leg: Secondary | ICD-10-CM | POA: Diagnosis not present

## 2021-06-12 DIAGNOSIS — M48062 Spinal stenosis, lumbar region with neurogenic claudication: Secondary | ICD-10-CM | POA: Diagnosis not present

## 2021-06-15 DIAGNOSIS — M62562 Muscle wasting and atrophy, not elsewhere classified, left lower leg: Secondary | ICD-10-CM | POA: Diagnosis not present

## 2021-06-15 DIAGNOSIS — R278 Other lack of coordination: Secondary | ICD-10-CM | POA: Diagnosis not present

## 2021-06-15 DIAGNOSIS — M48062 Spinal stenosis, lumbar region with neurogenic claudication: Secondary | ICD-10-CM | POA: Diagnosis not present

## 2021-06-15 DIAGNOSIS — M62561 Muscle wasting and atrophy, not elsewhere classified, right lower leg: Secondary | ICD-10-CM | POA: Diagnosis not present

## 2021-06-15 DIAGNOSIS — R2689 Other abnormalities of gait and mobility: Secondary | ICD-10-CM | POA: Diagnosis not present

## 2021-06-15 DIAGNOSIS — M5459 Other low back pain: Secondary | ICD-10-CM | POA: Diagnosis not present

## 2021-06-15 DIAGNOSIS — Z4789 Encounter for other orthopedic aftercare: Secondary | ICD-10-CM | POA: Diagnosis not present

## 2021-06-15 DIAGNOSIS — F0150 Vascular dementia without behavioral disturbance: Secondary | ICD-10-CM | POA: Diagnosis not present

## 2021-06-16 ENCOUNTER — Encounter: Payer: Self-pay | Admitting: Internal Medicine

## 2021-06-16 ENCOUNTER — Non-Acute Institutional Stay (SKILLED_NURSING_FACILITY): Payer: Medicare Other | Admitting: Internal Medicine

## 2021-06-16 DIAGNOSIS — F419 Anxiety disorder, unspecified: Secondary | ICD-10-CM

## 2021-06-16 DIAGNOSIS — R110 Nausea: Secondary | ICD-10-CM | POA: Diagnosis not present

## 2021-06-16 DIAGNOSIS — E039 Hypothyroidism, unspecified: Secondary | ICD-10-CM

## 2021-06-16 DIAGNOSIS — F0150 Vascular dementia without behavioral disturbance: Secondary | ICD-10-CM

## 2021-06-16 DIAGNOSIS — I10 Essential (primary) hypertension: Secondary | ICD-10-CM | POA: Diagnosis not present

## 2021-06-16 DIAGNOSIS — M62562 Muscle wasting and atrophy, not elsewhere classified, left lower leg: Secondary | ICD-10-CM | POA: Diagnosis not present

## 2021-06-16 DIAGNOSIS — R251 Tremor, unspecified: Secondary | ICD-10-CM | POA: Diagnosis not present

## 2021-06-16 DIAGNOSIS — M62561 Muscle wasting and atrophy, not elsewhere classified, right lower leg: Secondary | ICD-10-CM | POA: Diagnosis not present

## 2021-06-16 DIAGNOSIS — M6281 Muscle weakness (generalized): Secondary | ICD-10-CM | POA: Diagnosis not present

## 2021-06-16 DIAGNOSIS — Z9889 Other specified postprocedural states: Secondary | ICD-10-CM | POA: Diagnosis not present

## 2021-06-16 DIAGNOSIS — Z4789 Encounter for other orthopedic aftercare: Secondary | ICD-10-CM | POA: Diagnosis not present

## 2021-06-16 DIAGNOSIS — E782 Mixed hyperlipidemia: Secondary | ICD-10-CM | POA: Diagnosis not present

## 2021-06-16 DIAGNOSIS — R278 Other lack of coordination: Secondary | ICD-10-CM | POA: Diagnosis not present

## 2021-06-16 DIAGNOSIS — M5459 Other low back pain: Secondary | ICD-10-CM | POA: Diagnosis not present

## 2021-06-16 DIAGNOSIS — M48062 Spinal stenosis, lumbar region with neurogenic claudication: Secondary | ICD-10-CM | POA: Diagnosis not present

## 2021-06-16 DIAGNOSIS — R2689 Other abnormalities of gait and mobility: Secondary | ICD-10-CM | POA: Diagnosis not present

## 2021-06-16 NOTE — Progress Notes (Signed)
Location:    [place] Room Number: 737 Place of Service:  SNF [phone]) Provider:  Veleta Miners MD  Virgie Dad, MD  Patient Care Team: Virgie Dad, MD as PCP - General (Internal Medicine) Nahser, Wonda Cheng, MD as PCP - Cardiology (Cardiology) Clarene Essex, MD as Consulting Physician (Gastroenterology) Marcial Pacas, MD as Consulting Physician (Neurology) Kristeen Miss, MD as Consulting Physician (Neurosurgery) Martinique, Amy, MD as Consulting Physician (Dermatology) Megan Salon, MD as Consulting Physician (Gynecology) [place], Heather, [place] [facility])  Extended Emergency Contact Information Primary Emergency Contact: Kincy,Robert J Address: [address]          [place], [place] [address] [name] of [place] Phone: [phone] Work Phone: [phone] Mobile Phone: [phone] Relation: Spouse Secondary Emergency Contact: Quitman Livings of [place] Phone: [phone] Mobile Phone: [phone] Relation: Son  Code Status:  DNR Goals of care: Advanced Directive information Advanced Directives 06/16/2021  Does Patient Have a Medical Advance Directive? Yes  Type of Paramedic of [place];Living will;Out of facility DNR (pink MOST or yellow form)  Does patient want to make changes to medical advance directive? No - Patient declined  Copy of [place] in Chart? Yes - validated most recent copy scanned in chart (See row information)  Would patient like information on creating a medical advance directive? -  Pre-existing out of facility DNR order (yellow form or pink MOST form) -     Chief Complaint  Patient presents with   Medical Management of Chronic Issues   Quality Metric Gaps    Shingrix, #4 Covid, Flu shot    HPI:  Pt is a 80 y.o. female seen today for medical management of chronic diseases.    Was admitted to hospital from 9/12-9/13 for  L4-5, L5-S1  decompressive laminectomy with foraminotomies  Patient has h/o  Vascular dementia S/p CVA in 2018 and Repeat MRI has shown Chronic Vessel Changes Essential Tremor, Anxiety, Brachial Plexux Palsy, Chronic Pain in her Left Knee, Hypothyroidism, Hyperlipidemia,   Patient lives in [name] and has been having severe Lower back pain did not respond to Conservative management Had severe multifactorial stenosis at L4-5 and L5S1. 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 Street of back injections by Dr. Brien Few 2016 with good relief, history of cervical DD with possiblity of surgery by Dr. Trenton Gammon.   GERD (gastroesophageal reflux disease)    IBS with moderate refluc seen on upper GI study form January 2011 Dr. Watt Climes   HTN (hypertension)    no meds   Hyperlipidemia    Hypothyroidism    IBS (irritable bowel syndrome)    Imbalance    Impingement syndrome of right shoulder 01/05/2013   Mass of left side of neck    Memory loss    Mood disorder (HCC)    MVP (mitral valve prolapse)    Hx of    Osteopenia  Peripheral edema    Right rotator cuff tear 01/05/2013   Sciatic pain    recurrent   Seasonal allergies    Stroke [facility])    Tension headache    Tremor    Past Surgical History:  Procedure Laterality Date   ANTERIOR CERVICAL DECOMP/DISCECTOMY FUSION  04/2015    Dr. Pamala Hurry, [place]  2004   which revealed smooth and normal coronary arteries    CARPAL TUNNEL RELEASE  2000   lt   CATARACT EXTRACTION W/ INTRAOCULAR LENS  IMPLANT, BILATERAL  1/17   Dr. Lucita Ferrara   COSMETIC SURGERY  2010   eyes-facial   DENTAL SURGERY  1960   LASIK     LOOP RECORDER INSERTION N/A 09/22/2017   Procedure: LOOP RECORDER INSERTION;  Surgeon: Constance Haw, MD;  Location: [place] CV LAB;  Service: Cardiovascular;  Laterality: N/A;   LUMBAR LAMINECTOMY/DECOMPRESSION MICRODISCECTOMY N/A 06/08/2021   Procedure: Laminectomy and Foraminotomy - L4-L5, L5-S1;  Surgeon: Earnie Larsson, MD;  Location: [place];  Service: Neurosurgery;  Laterality: N/A;   SHOULDER ARTHROSCOPY WITH ROTATOR CUFF REPAIR Right 01/05/2013   Procedure: SHOULDER ARTHROSCOPY WITH ARTHROSCOPIC  ROTATOR CUFF REPAIR, ACROMIOPLASTY, EXTENSIVE DEBRIDEMENT;  Surgeon: Johnny Bridge, MD;  Location: [place];  Service: Orthopedics;  Laterality: Right;   TEE WITHOUT CARDIOVERSION N/A 09/22/2017   Procedure: TRANSESOPHAGEAL ECHOCARDIOGRAM (TEE);  Surgeon: Sanda Klein, MD;  [facility];  Service: Cardiovascular;  Laterality: N/A;   TONSILLECTOMY     TUBAL LIGATION      Allergies  Allergen Reactions   Fluticasone Other (See Comments)    DRY EYES   Aciphex [Rabeprazole]     Critical    Codeine Nausea Only   Cymbalta [Duloxetine Hcl]     Critical    Lexapro [Escitalopram]     critical   Lipitor [Atorvastatin Calcium]     Intolerant    Pneumovax [Pneumococcal Polysaccharide Vaccine]     moderate   Prevacid [Lansoprazole]     Unknown, listed on MAR   Rosuvastatin     Intolerant    Sertraline     critical   Statins     Critical    Sulfa Antibiotics     vomiting   Tetracyclines & Related     Morphine Sulfate   Welchol [Colesevelam]     Unknown, listed on MAR   Pneumovax 23 [Pneumococcal Vac Polyvalent] Rash    Injection site reaction    Allergies as of 06/16/2021       Reactions   Fluticasone Other (See Comments)   DRY EYES   Aciphex  [rabeprazole]    Critical    Codeine Nausea Only   Cymbalta [duloxetine Hcl]    Critical   Lexapro [escitalopram]    critical   Lipitor [atorvastatin Calcium]    Intolerant    Pneumovax [pneumococcal Polysaccharide Vaccine]    moderate   Prevacid [lansoprazole]    Unknown, listed on MAR   Rosuvastatin    Intolerant    Sertraline    critical   Statins    Critical   Sulfa Antibiotics    vomiting   Tetracyclines & Related    Morphine Sulfate   Welchol [colesevelam]    Unknown, listed on MAR   Pneumovax 23 [pneumococcal Vac Polyvalent] Rash   Injection site reaction        Medication List        Accurate as of June 16, 2021  2:35 PM.  If you have any questions, ask your nurse or doctor.          acetaminophen 325 MG tablet Commonly known as: TYLENOL Take 650 mg by mouth See admin instructions. Take 650 mg 3 times daily, may take 650 mg 2 times daily as needed for pain   aspirin EC 81 MG tablet Take 81 mg by mouth daily. Swallow whole.   CALCIUM 500 + D PO Take 1 tablet by mouth daily.   calcium carbonate 500 MG chewable tablet Commonly known as: TUMS - dosed in mg elemental calcium Chew 2 tablets by mouth 2 (two) times daily as needed for indigestion or heartburn.   clonazePAM 0.5 MG tablet Commonly known as: KLONOPIN Take 0.25 mg by mouth 2 (two) times daily as needed (for anxiety and shakiness).   clopidogrel 75 MG tablet Commonly known as: PLAVIX Take 1 tablet (75 mg total) by mouth daily.   donepezil 10 MG tablet Commonly known as: ARICEPT Take 10 mg by mouth daily.   gabapentin 100 MG capsule Commonly known as: NEURONTIN Take 100 mg by mouth daily in the afternoon.   gabapentin 300 MG capsule Commonly known as: NEURONTIN Take 1 capsule (300 mg total) by mouth in the morning and at bedtime.   levothyroxine 88 MCG tablet Commonly known as: SYNTHROID Take 88 mcg by mouth daily before breakfast.   melatonin 5 MG Tabs Take 5 mg by mouth  at bedtime as needed (sleep).   omeprazole 40 MG capsule Commonly known as: PRILOSEC Take 40 mg by mouth daily.   PRESERVISION AREDS PO Take 1 tablet by mouth 2 (two) times daily.   propranolol 40 MG tablet Commonly known as: INDERAL Take 1 tablet (40 mg total) by mouth 2 (two) times daily. Please call [phone] to schedule an appt.   rosuvastatin 5 MG tablet Commonly known as: CRESTOR Take 1 tablet (5 mg total) by mouth daily.   traMADol 50 MG tablet Commonly known as: Ultram Take 1 tablet (50 mg total) by mouth every 8 (eight) hours as needed for up to 7 days.        Review of Systems  Constitutional:  Positive for activity change.  HENT: Negative.    Respiratory: Negative.    Cardiovascular: Negative.   Gastrointestinal:  Positive for abdominal distention and nausea.  Genitourinary: Negative.   Musculoskeletal:  Positive for arthralgias, back pain, gait problem and myalgias.  Skin: Negative.   Neurological:  Positive for weakness.  Psychiatric/Behavioral:  Positive for confusion.    Immunization History  Administered Date(s) Administered   Influenza, High Dose Seasonal PF 07/23/2017, 07/03/2019   Influenza-Unspecified 05/27/2010, 08/03/2010, 07/22/2011, 07/01/2012, 06/22/2013, 07/18/2020   Janssen (J&J) SARS-COV-2 Vaccination 11/06/2019   Moderna Sars-Covid-2 Vaccination 08/07/2020   PFIZER(Purple Top)SARS-COV-2 Vaccination 10/09/2019   Pneumococcal Conjugate-13 02/08/2015   Pneumococcal Polysaccharide-23 05/28/2006, 10/13/2011   Pneumococcal-Unspecified 10/17/2013   Td 03/29/2002   Tdap 09/27/2001, 04/25/2002, 08/18/2012   Zoster, Live 08/30/2006   Zoster, Unspecified 08/30/2006   Pertinent  Health Maintenance Due  Topic Date Due   INFLUENZA VACCINE  04/27/2021   DEXA SCAN  Completed   Fall Risk  05/18/2021 03/18/2021 02/16/2021 09/17/2020 05/28/2020  Falls in the past year? 0 0 0 0 0  Number falls in past yr: 0 0 0 0 0  Injury with Fall? - - - 0 0  Follow  up Falls evaluation completed - - - -   Functional Status Survey:    Vitals:   06/16/21 1424  BP: (!) 142/83  Pulse: 71  Resp: 18  Temp: 98.2 F (36.8 C)  SpO2: 97%  Height: 5\' 1"  (1.549 m)   Body mass index is 27.13 kg/m. Physical Exam Vitals reviewed.  Constitutional:      Appearance: Normal appearance.  HENT:     Head: Normocephalic.     Nose: Nose normal.     Mouth/Throat:     Mouth: Mucous membranes are moist.     Pharynx: Oropharynx is clear.  Eyes:     Pupils: Pupils are equal, round, and reactive to light.  Cardiovascular:     Rate and Rhythm: Normal rate and regular rhythm.     Pulses: Normal pulses.     Heart sounds: Normal heart sounds.  Pulmonary:     Effort: Pulmonary effort is normal.     Breath sounds: Normal breath sounds.  Abdominal:     General: Abdomen is flat. Bowel sounds are normal. There is no distension.     Palpations: Abdomen is soft.     Tenderness: There is no abdominal tenderness.  Musculoskeletal:        General: No swelling.     Cervical back: Neck supple.  Skin:    General: Skin is warm.  Neurological:     General: No focal deficit present.     Mental Status: She is alert.  Psychiatric:        Mood and Affect: Mood normal.        Thought Content: Thought content normal.        Judgment: Judgment normal.    Labs reviewed: Recent Labs    02/09/21 0000 06/04/21 0000  NA 143 141  K 4.3 4.1  CL 105 102  CO2 23* 26*  BUN 10 11  CREATININE 0.8 0.7  CALCIUM 9.4 9.8   No results for input(s): AST, ALT, ALKPHOS, BILITOT, PROT, ALBUMIN in the last 8760 hours. Recent Labs    02/09/21 0000 06/04/21 0000  WBC 4.8 5.6  HGB 14.4 14.3  HCT 41 41  PLT 272 306   Lab Results  Component Value Date   TSH 0.94 04/14/2021   Lab Results  Component Value Date   HGBA1C 5.6 09/21/2017   Lab Results  Component Value Date   CHOL 191 04/14/2021   HDL 61 04/14/2021   LDLCALC 99 04/14/2021   TRIG 156 04/14/2021   CHOLHDL 2.1  09/21/2017    Significant Diagnostic Results in last 30 days:  DG Lumbar Spine 2-3 Views  Result Date: 06/08/2021 CLINICAL DATA:  Intraoperative examination, lumbar fusion EXAM: LUMBAR SPINE - 2-3 VIEW COMPARISON:  05/22/2020 FINDINGS: Portable cross-table lateral radiograph of the a lumbar spine is presented postprocedurally for interpretation. Soft tissue retractors are seen posterior to Z8-5 with a metallic probe overlying the L4 pars lamina. Advanced degenerative disc disease is seen from L2-S1. Minimal anterolisthesis of L4 upon L5 is stable from prior examination and likely degenerative in nature. IMPRESSION: Metallic probe overlies the L4 lamina. Electronically Signed   By: Fidela Salisbury M.D.   On: 06/08/2021 15:26    Assessment/Plan  Nausea Exam is normal On Prilosec Did have good Bowel Movement per nurses Will decrease the dose of Tramadol to 25 mg and see if it helps If not can consider Xray S/P lumbar laminectomy Pain Controlled Working with therapy Essential hypertension On Inderal Only Vascular dementia without behavioral disturbance ([place]) On Aricept 10/21 MMSE 25/30 Will consider Namenda when better Acquired hypothyroidism TSH normal in 7/22 Mixed hyperlipidemia  On Statin LDL 99 in 7/22 Tremor On High dose of Inderal Anxiety Klonopin prn H/o CVA in 2018 On Plavix and statin  Family/ staff Communication:   Labs/tests ordered:

## 2021-06-17 DIAGNOSIS — Z4789 Encounter for other orthopedic aftercare: Secondary | ICD-10-CM | POA: Diagnosis not present

## 2021-06-17 DIAGNOSIS — M62561 Muscle wasting and atrophy, not elsewhere classified, right lower leg: Secondary | ICD-10-CM | POA: Diagnosis not present

## 2021-06-17 DIAGNOSIS — R2689 Other abnormalities of gait and mobility: Secondary | ICD-10-CM | POA: Diagnosis not present

## 2021-06-17 DIAGNOSIS — R278 Other lack of coordination: Secondary | ICD-10-CM | POA: Diagnosis not present

## 2021-06-17 DIAGNOSIS — M62562 Muscle wasting and atrophy, not elsewhere classified, left lower leg: Secondary | ICD-10-CM | POA: Diagnosis not present

## 2021-06-17 DIAGNOSIS — M6281 Muscle weakness (generalized): Secondary | ICD-10-CM | POA: Diagnosis not present

## 2021-06-17 DIAGNOSIS — M48062 Spinal stenosis, lumbar region with neurogenic claudication: Secondary | ICD-10-CM | POA: Diagnosis not present

## 2021-06-17 DIAGNOSIS — F0150 Vascular dementia without behavioral disturbance: Secondary | ICD-10-CM | POA: Diagnosis not present

## 2021-06-17 DIAGNOSIS — M5459 Other low back pain: Secondary | ICD-10-CM | POA: Diagnosis not present

## 2021-06-18 ENCOUNTER — Other Ambulatory Visit: Payer: Self-pay | Admitting: Adult Health

## 2021-06-18 DIAGNOSIS — R278 Other lack of coordination: Secondary | ICD-10-CM | POA: Diagnosis not present

## 2021-06-18 DIAGNOSIS — M62562 Muscle wasting and atrophy, not elsewhere classified, left lower leg: Secondary | ICD-10-CM | POA: Diagnosis not present

## 2021-06-18 DIAGNOSIS — M6281 Muscle weakness (generalized): Secondary | ICD-10-CM | POA: Diagnosis not present

## 2021-06-18 DIAGNOSIS — M5459 Other low back pain: Secondary | ICD-10-CM | POA: Diagnosis not present

## 2021-06-18 DIAGNOSIS — M48062 Spinal stenosis, lumbar region with neurogenic claudication: Secondary | ICD-10-CM | POA: Diagnosis not present

## 2021-06-18 DIAGNOSIS — F0150 Vascular dementia without behavioral disturbance: Secondary | ICD-10-CM | POA: Diagnosis not present

## 2021-06-18 DIAGNOSIS — M62561 Muscle wasting and atrophy, not elsewhere classified, right lower leg: Secondary | ICD-10-CM | POA: Diagnosis not present

## 2021-06-18 DIAGNOSIS — R2689 Other abnormalities of gait and mobility: Secondary | ICD-10-CM | POA: Diagnosis not present

## 2021-06-18 DIAGNOSIS — Z4789 Encounter for other orthopedic aftercare: Secondary | ICD-10-CM | POA: Diagnosis not present

## 2021-06-18 MED ORDER — ROSUVASTATIN CALCIUM 5 MG PO TABS: 5.0000 mg | ORAL_TABLET | Freq: Every day | ORAL | 3 refills | Status: DC

## 2021-06-18 NOTE — Progress Notes (Signed)
Pt has been taking crestor without s/e

## 2021-06-19 DIAGNOSIS — R42 Dizziness and giddiness: Secondary | ICD-10-CM | POA: Diagnosis not present

## 2021-06-19 DIAGNOSIS — F0150 Vascular dementia without behavioral disturbance: Secondary | ICD-10-CM | POA: Diagnosis not present

## 2021-06-19 DIAGNOSIS — Z4789 Encounter for other orthopedic aftercare: Secondary | ICD-10-CM | POA: Diagnosis not present

## 2021-06-19 DIAGNOSIS — M5459 Other low back pain: Secondary | ICD-10-CM | POA: Diagnosis not present

## 2021-06-19 DIAGNOSIS — Z8673 Personal history of transient ischemic attack (TIA), and cerebral infarction without residual deficits: Secondary | ICD-10-CM | POA: Diagnosis not present

## 2021-06-19 DIAGNOSIS — M62562 Muscle wasting and atrophy, not elsewhere classified, left lower leg: Secondary | ICD-10-CM | POA: Diagnosis not present

## 2021-06-19 DIAGNOSIS — M48062 Spinal stenosis, lumbar region with neurogenic claudication: Secondary | ICD-10-CM | POA: Diagnosis not present

## 2021-06-19 DIAGNOSIS — M62561 Muscle wasting and atrophy, not elsewhere classified, right lower leg: Secondary | ICD-10-CM | POA: Diagnosis not present

## 2021-06-19 DIAGNOSIS — R2689 Other abnormalities of gait and mobility: Secondary | ICD-10-CM | POA: Diagnosis not present

## 2021-06-19 DIAGNOSIS — R41841 Cognitive communication deficit: Secondary | ICD-10-CM | POA: Diagnosis not present

## 2021-06-19 DIAGNOSIS — R278 Other lack of coordination: Secondary | ICD-10-CM | POA: Diagnosis not present

## 2021-06-19 DIAGNOSIS — M6281 Muscle weakness (generalized): Secondary | ICD-10-CM | POA: Diagnosis not present

## 2021-06-22 DIAGNOSIS — M62561 Muscle wasting and atrophy, not elsewhere classified, right lower leg: Secondary | ICD-10-CM | POA: Diagnosis not present

## 2021-06-22 DIAGNOSIS — R41841 Cognitive communication deficit: Secondary | ICD-10-CM | POA: Diagnosis not present

## 2021-06-22 DIAGNOSIS — R278 Other lack of coordination: Secondary | ICD-10-CM | POA: Diagnosis not present

## 2021-06-22 DIAGNOSIS — R42 Dizziness and giddiness: Secondary | ICD-10-CM | POA: Diagnosis not present

## 2021-06-22 DIAGNOSIS — Z4789 Encounter for other orthopedic aftercare: Secondary | ICD-10-CM | POA: Diagnosis not present

## 2021-06-22 DIAGNOSIS — M62562 Muscle wasting and atrophy, not elsewhere classified, left lower leg: Secondary | ICD-10-CM | POA: Diagnosis not present

## 2021-06-22 DIAGNOSIS — R2689 Other abnormalities of gait and mobility: Secondary | ICD-10-CM | POA: Diagnosis not present

## 2021-06-22 DIAGNOSIS — F0150 Vascular dementia without behavioral disturbance: Secondary | ICD-10-CM | POA: Diagnosis not present

## 2021-06-22 DIAGNOSIS — Z8673 Personal history of transient ischemic attack (TIA), and cerebral infarction without residual deficits: Secondary | ICD-10-CM | POA: Diagnosis not present

## 2021-06-22 DIAGNOSIS — M6281 Muscle weakness (generalized): Secondary | ICD-10-CM | POA: Diagnosis not present

## 2021-06-22 DIAGNOSIS — M5459 Other low back pain: Secondary | ICD-10-CM | POA: Diagnosis not present

## 2021-06-22 DIAGNOSIS — M48062 Spinal stenosis, lumbar region with neurogenic claudication: Secondary | ICD-10-CM | POA: Diagnosis not present

## 2021-06-23 DIAGNOSIS — M48062 Spinal stenosis, lumbar region with neurogenic claudication: Secondary | ICD-10-CM | POA: Diagnosis not present

## 2021-06-23 DIAGNOSIS — M62562 Muscle wasting and atrophy, not elsewhere classified, left lower leg: Secondary | ICD-10-CM | POA: Diagnosis not present

## 2021-06-23 DIAGNOSIS — M5459 Other low back pain: Secondary | ICD-10-CM | POA: Diagnosis not present

## 2021-06-23 DIAGNOSIS — R278 Other lack of coordination: Secondary | ICD-10-CM | POA: Diagnosis not present

## 2021-06-23 DIAGNOSIS — M62561 Muscle wasting and atrophy, not elsewhere classified, right lower leg: Secondary | ICD-10-CM | POA: Diagnosis not present

## 2021-06-23 DIAGNOSIS — R2689 Other abnormalities of gait and mobility: Secondary | ICD-10-CM | POA: Diagnosis not present

## 2021-06-23 DIAGNOSIS — M6281 Muscle weakness (generalized): Secondary | ICD-10-CM | POA: Diagnosis not present

## 2021-06-23 DIAGNOSIS — Z4789 Encounter for other orthopedic aftercare: Secondary | ICD-10-CM | POA: Diagnosis not present

## 2021-06-23 DIAGNOSIS — F0150 Vascular dementia without behavioral disturbance: Secondary | ICD-10-CM | POA: Diagnosis not present

## 2021-06-24 DIAGNOSIS — Z4789 Encounter for other orthopedic aftercare: Secondary | ICD-10-CM | POA: Diagnosis not present

## 2021-06-24 DIAGNOSIS — R2689 Other abnormalities of gait and mobility: Secondary | ICD-10-CM | POA: Diagnosis not present

## 2021-06-24 DIAGNOSIS — M62561 Muscle wasting and atrophy, not elsewhere classified, right lower leg: Secondary | ICD-10-CM | POA: Diagnosis not present

## 2021-06-24 DIAGNOSIS — R278 Other lack of coordination: Secondary | ICD-10-CM | POA: Diagnosis not present

## 2021-06-24 DIAGNOSIS — M6281 Muscle weakness (generalized): Secondary | ICD-10-CM | POA: Diagnosis not present

## 2021-06-24 DIAGNOSIS — M48062 Spinal stenosis, lumbar region with neurogenic claudication: Secondary | ICD-10-CM | POA: Diagnosis not present

## 2021-06-24 DIAGNOSIS — M62562 Muscle wasting and atrophy, not elsewhere classified, left lower leg: Secondary | ICD-10-CM | POA: Diagnosis not present

## 2021-06-24 DIAGNOSIS — F0150 Vascular dementia without behavioral disturbance: Secondary | ICD-10-CM | POA: Diagnosis not present

## 2021-06-24 DIAGNOSIS — R42 Dizziness and giddiness: Secondary | ICD-10-CM | POA: Diagnosis not present

## 2021-06-24 DIAGNOSIS — M5459 Other low back pain: Secondary | ICD-10-CM | POA: Diagnosis not present

## 2021-06-24 DIAGNOSIS — Z8673 Personal history of transient ischemic attack (TIA), and cerebral infarction without residual deficits: Secondary | ICD-10-CM | POA: Diagnosis not present

## 2021-06-24 DIAGNOSIS — R41841 Cognitive communication deficit: Secondary | ICD-10-CM | POA: Diagnosis not present

## 2021-06-25 DIAGNOSIS — M62562 Muscle wasting and atrophy, not elsewhere classified, left lower leg: Secondary | ICD-10-CM | POA: Diagnosis not present

## 2021-06-25 DIAGNOSIS — Z4789 Encounter for other orthopedic aftercare: Secondary | ICD-10-CM | POA: Diagnosis not present

## 2021-06-25 DIAGNOSIS — R41841 Cognitive communication deficit: Secondary | ICD-10-CM | POA: Diagnosis not present

## 2021-06-25 DIAGNOSIS — M62561 Muscle wasting and atrophy, not elsewhere classified, right lower leg: Secondary | ICD-10-CM | POA: Diagnosis not present

## 2021-06-25 DIAGNOSIS — R42 Dizziness and giddiness: Secondary | ICD-10-CM | POA: Diagnosis not present

## 2021-06-25 DIAGNOSIS — Z8673 Personal history of transient ischemic attack (TIA), and cerebral infarction without residual deficits: Secondary | ICD-10-CM | POA: Diagnosis not present

## 2021-06-25 DIAGNOSIS — R278 Other lack of coordination: Secondary | ICD-10-CM | POA: Diagnosis not present

## 2021-06-25 DIAGNOSIS — M6281 Muscle weakness (generalized): Secondary | ICD-10-CM | POA: Diagnosis not present

## 2021-06-25 DIAGNOSIS — M5459 Other low back pain: Secondary | ICD-10-CM | POA: Diagnosis not present

## 2021-06-25 DIAGNOSIS — R2689 Other abnormalities of gait and mobility: Secondary | ICD-10-CM | POA: Diagnosis not present

## 2021-06-25 DIAGNOSIS — M48062 Spinal stenosis, lumbar region with neurogenic claudication: Secondary | ICD-10-CM | POA: Diagnosis not present

## 2021-06-25 DIAGNOSIS — F0150 Vascular dementia without behavioral disturbance: Secondary | ICD-10-CM | POA: Diagnosis not present

## 2021-06-26 ENCOUNTER — Non-Acute Institutional Stay: Payer: Medicare Other | Admitting: Adult Health

## 2021-06-26 ENCOUNTER — Encounter: Payer: Self-pay | Admitting: Adult Health

## 2021-06-26 DIAGNOSIS — U071 COVID-19: Secondary | ICD-10-CM

## 2021-06-26 NOTE — Progress Notes (Signed)
Location:  Occupational psychologist of Service:  ALF (13) Provider:   Cindi Carbon, [place] [phone]   Virgie Dad, MD  Patient Care Team: Virgie Dad, MD as PCP - General (Internal Medicine) Nahser, Wonda Cheng, MD as PCP - Cardiology (Cardiology) Clarene Essex, MD as Consulting Physician (Gastroenterology) Marcial Pacas, MD as Consulting Physician (Neurology) Kristeen Miss, MD as Consulting Physician (Neurosurgery) [place], Amy, MD as Consulting Physician (Dermatology) Megan Salon, MD as Consulting Physician (Gynecology) [place], Heather, [place])  Extended Emergency Contact Information Primary Emergency Contact: Delfino,Robert J Address: [address]          [place], [place] [address] [name] of [place] Phone: [phone] Work Phone: [phone] Mobile Phone: [phone] Relation: Spouse Secondary Emergency Contact: Quitman Livings of [place] Phone: [phone] Mobile Phone: [phone] Relation: Son  Code Status:  DNR Goals of care: Advanced Directive information Advanced Directives 06/16/2021  Does Patient Have a Medical Advance Directive? Yes  Type of Paramedic of [place];Living will;Out of facility DNR (pink MOST or yellow form)  Does patient want to make changes to medical advance directive? No - Patient declined  Copy of [place] in Chart? Yes - validated most recent copy scanned in chart (See row information)  Would patient like information on creating a medical advance directive? -  Pre-existing out of facility DNR order (yellow form or pink MOST form) -     Chief Complaint  Patient presents with   Acute Visit    Covid positive    HPI:  Pt is a 80 y.o. female seen today for an acute visit for covid positive. Pt tested positive by rapid antigen test. Symptoms were cough and fatigue. Her husband also has covid. They live in an  apartment together. She does not have any sore throat, sob, body aches, nausea, vomiting, or diarrhea. She is vaccinated x 3. No fever.  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 Street of back injections by Dr. Brien Few 2016 with good relief, history of cervical DD with possiblity of surgery by Dr. Trenton Gammon.   GERD (gastroesophageal reflux disease)    IBS with moderate refluc seen on upper GI study form January 2011 Dr. Watt Climes   HTN (hypertension)    no meds   Hyperlipidemia    Hypothyroidism    IBS (irritable bowel syndrome)    Imbalance    Impingement syndrome of right shoulder 01/05/2013   Mass of left side of neck    Memory loss    Mood disorder ([place])    MVP (mitral valve prolapse)    Hx of    Osteopenia    Peripheral edema    Right rotator cuff tear 01/05/2013   Sciatic pain    recurrent   Seasonal allergies    Stroke [facility])    Tension headache    Tremor    Past Surgical History:  Procedure Laterality Date   ANTERIOR CERVICAL DECOMP/DISCECTOMY FUSION  04/2015    Dr. Pamala Hurry, [place]  2004   which revealed smooth and normal coronary arteries   CARPAL TUNNEL RELEASE  2000   lt  CATARACT EXTRACTION W/ INTRAOCULAR LENS  IMPLANT, BILATERAL  1/17   Dr. Lucita Ferrara   COSMETIC SURGERY  2010   eyes-facial   DENTAL SURGERY  1960   LASIK     LOOP RECORDER INSERTION N/A 09/22/2017   Procedure: LOOP RECORDER INSERTION;  Surgeon: Constance Haw, MD;  Location: [place] CV LAB;  Service: Cardiovascular;  Laterality: N/A;   LUMBAR LAMINECTOMY/DECOMPRESSION MICRODISCECTOMY N/A 06/08/2021   Procedure: Laminectomy and Foraminotomy - L4-L5, L5-S1;  Surgeon: Earnie Larsson, MD;  Location: [place];  Service: Neurosurgery;  Laterality:  N/A;   SHOULDER ARTHROSCOPY WITH ROTATOR CUFF REPAIR Right 01/05/2013   Procedure: SHOULDER ARTHROSCOPY WITH ARTHROSCOPIC  ROTATOR CUFF REPAIR, ACROMIOPLASTY, EXTENSIVE DEBRIDEMENT;  Surgeon: Johnny Bridge, MD;  Location: [place];  Service: Orthopedics;  Laterality: Right;   TEE WITHOUT CARDIOVERSION N/A 09/22/2017   Procedure: TRANSESOPHAGEAL ECHOCARDIOGRAM (TEE);  Surgeon: Sanda Klein, MD;  [facility];  Service: Cardiovascular;  Laterality: N/A;   TONSILLECTOMY     TUBAL LIGATION      Allergies  Allergen Reactions   Fluticasone Other (See Comments)    DRY EYES   Aciphex [Rabeprazole]     Critical    Codeine Nausea Only   Cymbalta [Duloxetine Hcl]     Critical    Lexapro [Escitalopram]     critical   Lipitor [Atorvastatin Calcium]     Intolerant    Pneumovax [Pneumococcal Polysaccharide Vaccine]     moderate   Prevacid [Lansoprazole]     Unknown, listed on MAR   Rosuvastatin     Intolerant    Sertraline     critical   Statins     Critical    Sulfa Antibiotics     vomiting   Tetracyclines & Related     Morphine Sulfate   Welchol [Colesevelam]     Unknown, listed on MAR   Pneumovax 23 [Pneumococcal Vac Polyvalent] Rash    Injection site reaction    Outpatient Encounter Medications as of 06/26/2021  Medication Sig   acetaminophen (TYLENOL) 325 MG tablet Take 650 mg by mouth See admin instructions. Take 650 mg 3 times daily, may take 650 mg 2 times daily as needed for pain   aspirin EC 81 MG tablet Take 81 mg by mouth daily. Swallow whole.   Calcium Carb-Cholecalciferol (CALCIUM 500 + D PO) Take 1 tablet by mouth daily.   calcium carbonate (TUMS - DOSED IN MG ELEMENTAL CALCIUM) 500 MG chewable tablet Chew 2 tablets by mouth 2 (two) times daily as needed for indigestion or heartburn.   clonazePAM (KLONOPIN) 0.5 MG tablet Take 0.25 mg by mouth 2 (two) times daily as needed (for anxiety and shakiness).   clopidogrel (PLAVIX) 75 MG tablet  Take 1 tablet (75 mg total) by mouth daily.   donepezil (ARICEPT) 10 MG tablet Take 10 mg by mouth daily.   gabapentin (NEURONTIN) 100 MG capsule Take 100 mg by mouth daily in the afternoon.   gabapentin (NEURONTIN) 300 MG capsule Take 1 capsule (300 mg total) by mouth in the morning and at bedtime.   levothyroxine (SYNTHROID) 88 MCG tablet Take 88 mcg by mouth daily before breakfast.   melatonin 5 MG TABS Take 5 mg by mouth at bedtime as needed (sleep).   Multiple Vitamins-Minerals (PRESERVISION AREDS PO) Take 1 tablet by mouth 2 (two) times daily.    omeprazole (PRILOSEC) 40 MG capsule Take 40 mg by mouth daily.   propranolol (INDERAL) 40 MG tablet Take 1  tablet (40 mg total) by mouth 2 (two) times daily. Please call [phone] to schedule an appt.   rosuvastatin (CRESTOR) 5 MG tablet Take 1 tablet (5 mg total) by mouth daily.   No facility-administered encounter medications on file as of 06/26/2021.    Review of Systems  Constitutional:  Positive for fatigue. Negative for activity change, appetite change, chills, diaphoresis, fever and unexpected weight change.  HENT:  Negative for congestion.   Respiratory:  Positive for cough. Negative for shortness of breath and wheezing.   Cardiovascular:  Negative for chest pain, palpitations and leg swelling.  Gastrointestinal:  Negative for abdominal distention, abdominal pain, constipation and diarrhea.  Genitourinary:  Negative for difficulty urinating and dysuria.  Musculoskeletal:  Positive for back pain (recently had back surgery) and gait problem. Negative for arthralgias, joint swelling and myalgias.  Neurological:  Negative for dizziness, tremors, seizures, syncope, facial asymmetry, speech difficulty, weakness, light-headedness, numbness and headaches.  Psychiatric/Behavioral:  Positive for confusion. Negative for agitation and behavioral problems.    Immunization History  Administered Date(s) Administered   Influenza, High Dose Seasonal PF  07/23/2017, 07/03/2019   Influenza-Unspecified 05/27/2010, 08/03/2010, 07/22/2011, 07/01/2012, 06/22/2013, 07/18/2020   Janssen (J&J) SARS-COV-2 Vaccination 11/06/2019   Moderna Sars-Covid-2 Vaccination 08/07/2020   PFIZER(Purple Top)SARS-COV-2 Vaccination 10/09/2019   Pneumococcal Conjugate-13 02/08/2015   Pneumococcal Polysaccharide-23 05/28/2006, 10/13/2011   Pneumococcal-Unspecified 10/17/2013   Td 03/29/2002   Tdap 09/27/2001, 04/25/2002, 08/18/2012   Zoster, Live 08/30/2006   Zoster, Unspecified 08/30/2006   Pertinent  Health Maintenance Due  Topic Date Due   INFLUENZA VACCINE  04/27/2021   DEXA SCAN  Completed   Fall Risk  05/18/2021 03/18/2021 02/16/2021 09/17/2020 05/28/2020  Falls in the past year? 0 0 0 0 0  Number falls in past yr: 0 0 0 0 0  Injury with Fall? - - - 0 0  Follow up Falls evaluation completed - - - -   Functional Status Survey:    Vitals:   06/26/21 1143  Temp: (!) 97 F (36.1 C)   There is no height or weight on file to calculate BMI. Physical Exam Vitals and nursing note reviewed.  Constitutional:      General: She is not in acute distress.    Appearance: She is not diaphoretic.  HENT:     Head: Normocephalic and atraumatic.  Neck:     Vascular: No JVD.  Cardiovascular:     Rate and Rhythm: Normal rate and regular rhythm.     Heart sounds: No murmur heard. Pulmonary:     Effort: Pulmonary effort is normal. No respiratory distress.     Breath sounds: Normal breath sounds. No wheezing.  Abdominal:     General: Bowel sounds are normal. There is no distension.     Palpations: Abdomen is soft.     Tenderness: There is no abdominal tenderness.  Lymphadenopathy:     Cervical: No cervical adenopathy.  Skin:    General: Skin is warm and dry.  Neurological:     Mental Status: She is alert and oriented to person, place, and time.    Labs reviewed: Recent Labs    02/09/21 0000 06/04/21 0000  NA 143 141  K 4.3 4.1  CL 105 102  CO2 23* 26*   BUN 10 11  CREATININE 0.8 0.7  CALCIUM 9.4 9.8   No results for input(s): AST, ALT, ALKPHOS, BILITOT, PROT, ALBUMIN in the last 8760 hours. Recent Labs    02/09/21 0000 06/04/21 0000  WBC  4.8 5.6  HGB 14.4 14.3  HCT 41 41  PLT 272 306   Lab Results  Component Value Date   TSH 0.94 04/14/2021   Lab Results  Component Value Date   HGBA1C 5.6 09/21/2017   Lab Results  Component Value Date   CHOL 191 04/14/2021   HDL 61 04/14/2021   LDLCALC 99 04/14/2021   TRIG 156 04/14/2021   CHOLHDL 2.1 09/21/2017    Significant Diagnostic Results in last 30 days:  DG Lumbar Spine 2-3 Views  Result Date: 06/08/2021 CLINICAL DATA:  Intraoperative examination, lumbar fusion EXAM: LUMBAR SPINE - 2-3 VIEW COMPARISON:  05/22/2020 FINDINGS: Portable cross-table lateral radiograph of the a lumbar spine is presented postprocedurally for interpretation. Soft tissue retractors are seen posterior to M1-8 with a metallic probe overlying the L4 pars lamina. Advanced degenerative disc disease is seen from L2-S1. Minimal anterolisthesis of L4 upon L5 is stable from prior examination and likely degenerative in nature. IMPRESSION: Metallic probe overlies the L4 lamina. Electronically Signed   By: Fidela Salisbury M.D.   On: 06/08/2021 15:26    Assessment/Plan 1. COVID-19 virus infection Mild at this time Monitor vitals and 02 sats Enhanced isolation x 10 days [facility] protocol initiated for symptoms management Begin paxlovid for 5 days.  GFR 82 Hold statin while on paxlovid.     Family/ staff Communication: resident and nurse  Labs/tests ordered:  na

## 2021-06-28 DIAGNOSIS — R059 Cough, unspecified: Secondary | ICD-10-CM | POA: Diagnosis not present

## 2021-06-28 DIAGNOSIS — R531 Weakness: Secondary | ICD-10-CM | POA: Diagnosis not present

## 2021-06-28 LAB — CBC AND DIFFERENTIAL
HCT: 38 (ref 36–46)
Hemoglobin: 13 (ref 12.0–16.0)
Neutrophils Absolute: 2.1
Platelets: 393 (ref 150–399)
WBC: 4.7

## 2021-06-28 LAB — BASIC METABOLIC PANEL
BUN: 12 (ref 4–21)
CO2: 29 — AB (ref 13–22)
Chloride: 101 (ref 99–108)
Creatinine: 0.7 (ref 0.5–1.1)
Glucose: 104
Potassium: 3.7 (ref 3.4–5.3)
Sodium: 139 (ref 137–147)

## 2021-06-28 LAB — COMPREHENSIVE METABOLIC PANEL: Calcium: 8.9 (ref 8.7–10.7)

## 2021-06-28 LAB — CBC: RBC: 4.1 (ref 3.87–5.11)

## 2021-06-29 ENCOUNTER — Encounter: Payer: Self-pay | Admitting: Internal Medicine

## 2021-06-29 ENCOUNTER — Non-Acute Institutional Stay (SKILLED_NURSING_FACILITY): Payer: Medicare Other | Admitting: Internal Medicine

## 2021-06-29 DIAGNOSIS — F0150 Vascular dementia without behavioral disturbance: Secondary | ICD-10-CM

## 2021-06-29 DIAGNOSIS — E782 Mixed hyperlipidemia: Secondary | ICD-10-CM | POA: Diagnosis not present

## 2021-06-29 DIAGNOSIS — F419 Anxiety disorder, unspecified: Secondary | ICD-10-CM

## 2021-06-29 DIAGNOSIS — U071 COVID-19: Secondary | ICD-10-CM | POA: Diagnosis not present

## 2021-06-29 DIAGNOSIS — R251 Tremor, unspecified: Secondary | ICD-10-CM

## 2021-06-29 DIAGNOSIS — I10 Essential (primary) hypertension: Secondary | ICD-10-CM

## 2021-06-29 DIAGNOSIS — Z9889 Other specified postprocedural states: Secondary | ICD-10-CM

## 2021-06-29 DIAGNOSIS — E039 Hypothyroidism, unspecified: Secondary | ICD-10-CM | POA: Diagnosis not present

## 2021-06-29 NOTE — Progress Notes (Signed)
Provider:  Veleta Miners MD  Location:    [place] Room Number: 703 Place of Service:  SNF ([phone])  PCP: Virgie Dad, MD Patient Care Team: Virgie Dad, MD as PCP - General (Internal Medicine) Nahser, Wonda Cheng, MD as PCP - Cardiology (Cardiology) Clarene Essex, MD as Consulting Physician (Gastroenterology) Marcial Pacas, MD as Consulting Physician (Neurology) Kristeen Miss, MD as Consulting Physician (Neurosurgery) Martinique, Amy, MD as Consulting Physician (Dermatology) Megan Salon, MD as Consulting Physician (Gynecology) [place], Heather, [facility])  Extended Emergency Contact Information Primary Emergency Contact: Hritz,Robert J Address: [address]          [place], [place] [address] [name] of [place] Phone: [phone] Work Phone: [phone] Mobile Phone: [phone] Relation: Spouse Secondary Emergency Contact: Quitman Livings of [place] Phone: [phone] Mobile Phone: [phone] Relation: Son  Code Status: DNR Goals of Care: Advanced Directive information Advanced Directives 06/29/2021  Does Patient Have a Medical Advance Directive? Yes  Type of Paramedic of [place];Living will;Out of facility DNR (pink MOST or yellow form)  Does patient want to make changes to medical advance directive? No - Patient declined  Copy of [place] in Chart? Yes - validated most recent copy scanned in chart (See row information)  Would patient like information on creating a medical advance directive? -  Pre-existing out of facility DNR order (yellow form or pink MOST form) -      Chief Complaint  Patient presents with   New Admit To SNF    Admission to rehab    HPI: Patient is a 80 y.o. female seen today for admission to SNF for therapy and Care Also is Covid Positive infection  Was admitted to hospital from 9/12-9/13 for  L4-5, L5-S1 decompressive  laminectomy with foraminotomies due to Severe Back pain Also has h/o  Vascular dementia S/p CVA in 2018 and Repeat MRI has shown Chronic Vessel Changes Essential Tremor, Anxiety, Brachial Plexux Palsy, Chronic Pain in her Left Knee, Hypothyroidism, Hyperlipidemia,    She was in SNF after surgery but then went back to her AL apartment with her husband She did not do well and got weak with sore throat and Cough NO SOB or cheat pain or fever She was diagnosed with Covid And is now on Paxlovid. 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 Street of back injections by Dr. Brien Few 2016 with good relief, history of cervical DD with possiblity of surgery by Dr. Trenton Gammon.   GERD (gastroesophageal reflux disease)    IBS with moderate refluc seen on upper GI study form January 2011 Dr. Watt Climes   HTN (hypertension)    no meds   Hyperlipidemia    Hypothyroidism    IBS (irritable bowel syndrome)    Imbalance    Impingement syndrome of right shoulder 01/05/2013   Mass of left side of neck  Memory loss    Mood disorder ([place])    MVP (mitral valve prolapse)    Hx of    Osteopenia    Peripheral edema    Right rotator cuff tear 01/05/2013   Sciatic pain    recurrent   Seasonal allergies    Stroke [facility])    Tension headache    Tremor    Past Surgical History:  Procedure Laterality Date   ANTERIOR CERVICAL DECOMP/DISCECTOMY FUSION  04/2015    Dr. Pamala Hurry, [place]  2004   which revealed smooth and  normal coronary arteries   CARPAL TUNNEL RELEASE  2000   lt   CATARACT EXTRACTION W/ INTRAOCULAR LENS  IMPLANT, BILATERAL  1/17   Dr. Lucita Ferrara   COSMETIC SURGERY  2010   eyes-facial   DENTAL SURGERY  1960   LASIK     LOOP RECORDER INSERTION N/A 09/22/2017   Procedure: LOOP RECORDER INSERTION;  Surgeon: Constance Haw, MD;  Location: [place];  Service: Cardiovascular;  Laterality: N/A;   LUMBAR LAMINECTOMY/DECOMPRESSION MICRODISCECTOMY N/A 06/08/2021   Procedure: Laminectomy and Foraminotomy - L4-L5, L5-S1;  Surgeon: Earnie Larsson, MD;  Location: [facility];  Service: Neurosurgery;  Laterality: N/A;   SHOULDER ARTHROSCOPY WITH ROTATOR CUFF REPAIR Right 01/05/2013   Procedure: SHOULDER ARTHROSCOPY WITH ARTHROSCOPIC  ROTATOR CUFF REPAIR, ACROMIOPLASTY, EXTENSIVE DEBRIDEMENT;  Surgeon: Johnny Bridge, MD;  Location: [facility];  Service: Orthopedics;  Laterality: Right;   TEE WITHOUT CARDIOVERSION N/A 09/22/2017   Procedure: TRANSESOPHAGEAL ECHOCARDIOGRAM (TEE);  Surgeon: Sanda Klein, MD;  Location: [facility];  Service: Cardiovascular;  Laterality: N/A;   TONSILLECTOMY     TUBAL LIGATION      reports that she quit smoking about 61 years ago. Her smoking use included cigarettes. She has never used smokeless tobacco. She reports that she does not currently use alcohol. She reports that she does not use drugs. Social History   Socioeconomic History   Marital status: Married    Spouse name: Not on file   Number of children: 2   Years of education: 16 years   Highest education level: Bachelor's degree (e.g., BA, AB, BS)  Occupational History   Occupation: Retired  Tobacco Use   Smoking status: Former    Types: Cigarettes    Quit date: 01/05/1960    Years since quitting: 61.5   Smokeless tobacco: Never   Tobacco comments:    Remote Hx  Vaping Use   Vaping Use: Never used  Substance and Sexual Activity   Alcohol use: Not Currently    Comment: rare    Drug use: No   Sexual activity: Not Currently    Birth control/protection: Post-menopausal  Other Topics Concern   Not on file  Social History Narrative   Lives at home with her husband.   Left-handed.   Occasional caffeine use.   Social Determinants of Health   Financial Resource Strain: Not on file  Food Insecurity: Not on file  Transportation Needs: Not on file  Physical Activity: Not on file  Stress: Not on file  Social Connections: Not on file  Intimate Partner Violence: Not on file    Functional Status Survey:    Family History  Problem Relation Age of Onset   Bladder Cancer Father    Mitral valve prolapse Mother    Cancer Maternal Grandfather     Health Maintenance  Topic Date Due   Zoster Vaccines- Shingrix (1 of 2) 09/[birth year]/1958  COVID-19 Vaccine (4 - Booster) 10/30/2020   INFLUENZA VACCINE  04/27/2021   TETANUS/TDAP  08/18/2022   DEXA SCAN  Completed   HPV VACCINES  Aged Out    Allergies  Allergen Reactions   Fluticasone Other (See Comments)    DRY EYES   Aciphex [Rabeprazole]     Critical    Codeine Nausea Only   Cymbalta [Duloxetine Hcl]     Critical    Lexapro [Escitalopram]     critical   Lipitor [Atorvastatin Calcium]     Intolerant    Pneumovax [Pneumococcal Polysaccharide Vaccine]     moderate   Prevacid [Lansoprazole]     Unknown, listed on MAR   Rosuvastatin     Intolerant    Sertraline     critical   Statins     Critical    Sulfa Antibiotics     vomiting   Tetracyclines & Related     Morphine Sulfate   Welchol [Colesevelam]     Unknown, listed on MAR   Pneumovax 23 [Pneumococcal Vac Polyvalent] Rash    Injection site reaction    Allergies as of 06/29/2021       Reactions   Fluticasone Other (See Comments)   DRY EYES   Aciphex [rabeprazole]    Critical    Codeine Nausea Only   Cymbalta [duloxetine Hcl]    Critical   Lexapro [escitalopram]    critical   Lipitor [atorvastatin Calcium]    Intolerant    Pneumovax  [pneumococcal Polysaccharide Vaccine]    moderate   Prevacid [lansoprazole]    Unknown, listed on MAR   Rosuvastatin    Intolerant    Sertraline    critical   Statins    Critical   Sulfa Antibiotics    vomiting   Tetracyclines & Related    Morphine Sulfate   Welchol [colesevelam]    Unknown, listed on MAR   Pneumovax 23 [pneumococcal Vac Polyvalent] Rash   Injection site reaction        Medication List        Accurate as of June 29, 2021  9:14 AM. If you have any questions, ask your nurse or doctor.          acetaminophen 325 MG tablet Commonly known as: TYLENOL Take 650 mg by mouth See admin instructions. Take 650 mg 3 times daily, may take 650 mg 2 times daily as needed for pain   aspirin EC 81 MG tablet Take 81 mg by mouth daily. Swallow whole.   CALCIUM 500 + D PO Take 1 tablet by mouth daily.   calcium carbonate 500 MG chewable tablet Commonly known as: TUMS - dosed in mg elemental calcium Chew 2 tablets by mouth 2 (two) times daily as needed for indigestion or heartburn.   clonazePAM 0.5 MG tablet Commonly known as: KLONOPIN Take 0.25 mg by mouth 2 (two) times daily as needed (for anxiety and shakiness).   clopidogrel 75 MG tablet Commonly known as: PLAVIX Take 1 tablet (75 mg total) by mouth daily.   DM-Benzocaine-Menthol 01-30-09 MG Lozg Use as directed in the mouth or throat every 2 (two) hours as needed.   donepezil 10 MG tablet Commonly known as: ARICEPT Take 10 mg by mouth daily.   gabapentin 100 MG capsule Commonly known as: NEURONTIN Take 100 mg by mouth daily in the afternoon.   gabapentin 300 MG capsule Commonly known as: NEURONTIN Take 1 capsule (300 mg total) by mouth in the morning and at  bedtime.   guaiFENesin 100 MG/5ML liquid Commonly known as: ROBITUSSIN Take 200 mg by mouth every 6 (six) hours as needed for cough.   levothyroxine 88 MCG tablet Commonly known as: SYNTHROID Take 88 mcg by mouth daily before breakfast.    melatonin 5 MG Tabs Take 5 mg by mouth at bedtime as needed (sleep).   nirmatrelvir & ritonavir 20 x 150 MG & 10 x 100MG  Tbpk Commonly known as: PAXLOVID Take by mouth in the morning and at bedtime.   omeprazole 40 MG capsule Commonly known as: PRILOSEC Take 40 mg by mouth daily.   PRESERVISION AREDS PO Take 1 tablet by mouth 2 (two) times daily.   propranolol 40 MG tablet Commonly known as: INDERAL Take 1 tablet (40 mg total) by mouth 2 (two) times daily. Please call [phone] to schedule an appt.   rosuvastatin 5 MG tablet Commonly known as: CRESTOR Take 1 tablet (5 mg total) by mouth daily.        Review of Systems  Constitutional:  Positive for activity change.  HENT:  Positive for sore throat.   Respiratory:  Positive for cough.   Gastrointestinal:  Positive for constipation.  Genitourinary: Negative.   Musculoskeletal:  Positive for gait problem.  Skin: Negative.   Neurological:  Positive for weakness.  Psychiatric/Behavioral:  Positive for confusion and dysphoric mood. Negative for agitation. The patient is nervous/anxious.    Vitals:   06/29/21 0859  BP: (!) 155/82  Pulse: 65  Resp: 17  Temp: (!) 97.3 F (36.3 C)  SpO2: 92%  Weight: 143 lb (64.9 kg)  Height: 5\' 1"  (1.549 m)   Body mass index is 27.02 kg/m. Physical Exam Constitutional: Well-developed and well-nourished.  HENT:  Head: Normocephalic.  Mouth/Throat: Oropharynx is clear and moist.  Eyes: Pupils are equal, round, and reactive to light.  Neck: Neck supple.  Cardiovascular: Normal rate and normal heart sounds.  No murmur heard. Pulmonary/Chest: Effort normal and breath sounds normal. No respiratory distress. No wheezes. She has no rales.  Abdominal: Soft. Bowel sounds are normal. No distension. There is no tenderness. There is no rebound.  Musculoskeletal: No edema.  Lymphadenopathy: none Neurological: No Focal deficits  Skin: Skin is warm and dry.  Psychiatric: Mild Confusion but  very anxious and tearful  Labs reviewed: Basic Metabolic Panel: Recent Labs    02/09/21 0000 06/04/21 0000 06/28/21 0000  NA 143 141 139  K 4.3 4.1 3.7  CL 105 102 101  CO2 23* 26* 29*  BUN 10 11 12   CREATININE 0.8 0.7 0.7  CALCIUM 9.4 9.8 8.9   Liver Function Tests: No results for input(s): AST, ALT, ALKPHOS, BILITOT, PROT, ALBUMIN in the last 8760 hours. No results for input(s): LIPASE, AMYLASE in the last 8760 hours. No results for input(s): AMMONIA in the last 8760 hours. CBC: Recent Labs    02/09/21 0000 06/04/21 0000 06/28/21 0000  WBC 4.8 5.6 4.7  NEUTROABS  --   --  2.10  HGB 14.4 14.3 13.0  HCT 41 41 38  PLT 272 306 393   Cardiac Enzymes: No results for input(s): CKTOTAL, CKMB, CKMBINDEX, TROPONINI in the last 8760 hours. BNP: Invalid input(s): POCBNP Lab Results  Component Value Date   HGBA1C 5.6 09/21/2017   Lab Results  Component Value Date   TSH 0.94 04/14/2021   Lab Results  Component Value Date   VITAMINB12 708 08/01/2017   Lab Results  Component Value Date   FOLATE 17.8 08/01/2017   Lab  Results  Component Value Date   FERRITIN 141 08/01/2017    Imaging and Procedures obtained prior to SNF admission: No results found.  Assessment/Plan  COVID-19 virus infection with weakness On Paxlovid Appetite is good No Fever Just some cough now She is weak and requiring more help with ADLS Will work with therapy again here in SNF All her labs reviewed and were in good levels  S/P lumbar laminectomy Pain seems controlled On tylenol and Neurontin Therapy will start once she is out of her Isolation  Essential hypertension Only on Inderal  Vascular dementia without behavioral disturbance ([place]) Continue on Aricept  Acquired hypothyroidism TSH normal in 7/22  Mixed hyperlipidemia Continue Crestor Tremor On High doses of Inderal Anxiety On Klonipin PRN  Family/ staff Communication:   Labs/tests ordered:

## 2021-07-01 DIAGNOSIS — Z23 Encounter for immunization: Secondary | ICD-10-CM | POA: Diagnosis not present

## 2021-07-07 DIAGNOSIS — Z4789 Encounter for other orthopedic aftercare: Secondary | ICD-10-CM | POA: Diagnosis not present

## 2021-07-07 DIAGNOSIS — F0150 Vascular dementia without behavioral disturbance: Secondary | ICD-10-CM | POA: Diagnosis not present

## 2021-07-07 DIAGNOSIS — R2689 Other abnormalities of gait and mobility: Secondary | ICD-10-CM | POA: Diagnosis not present

## 2021-07-07 DIAGNOSIS — M5459 Other low back pain: Secondary | ICD-10-CM | POA: Diagnosis not present

## 2021-07-07 DIAGNOSIS — M62561 Muscle wasting and atrophy, not elsewhere classified, right lower leg: Secondary | ICD-10-CM | POA: Diagnosis not present

## 2021-07-07 DIAGNOSIS — R278 Other lack of coordination: Secondary | ICD-10-CM | POA: Diagnosis not present

## 2021-07-07 DIAGNOSIS — M62562 Muscle wasting and atrophy, not elsewhere classified, left lower leg: Secondary | ICD-10-CM | POA: Diagnosis not present

## 2021-07-07 DIAGNOSIS — M6281 Muscle weakness (generalized): Secondary | ICD-10-CM | POA: Diagnosis not present

## 2021-07-07 DIAGNOSIS — M48062 Spinal stenosis, lumbar region with neurogenic claudication: Secondary | ICD-10-CM | POA: Diagnosis not present

## 2021-07-08 DIAGNOSIS — R41841 Cognitive communication deficit: Secondary | ICD-10-CM | POA: Diagnosis not present

## 2021-07-08 DIAGNOSIS — M5459 Other low back pain: Secondary | ICD-10-CM | POA: Diagnosis not present

## 2021-07-08 DIAGNOSIS — Z4789 Encounter for other orthopedic aftercare: Secondary | ICD-10-CM | POA: Diagnosis not present

## 2021-07-08 DIAGNOSIS — R2689 Other abnormalities of gait and mobility: Secondary | ICD-10-CM | POA: Diagnosis not present

## 2021-07-08 DIAGNOSIS — Z8673 Personal history of transient ischemic attack (TIA), and cerebral infarction without residual deficits: Secondary | ICD-10-CM | POA: Diagnosis not present

## 2021-07-08 DIAGNOSIS — M62562 Muscle wasting and atrophy, not elsewhere classified, left lower leg: Secondary | ICD-10-CM | POA: Diagnosis not present

## 2021-07-08 DIAGNOSIS — R42 Dizziness and giddiness: Secondary | ICD-10-CM | POA: Diagnosis not present

## 2021-07-08 DIAGNOSIS — R278 Other lack of coordination: Secondary | ICD-10-CM | POA: Diagnosis not present

## 2021-07-08 DIAGNOSIS — M62561 Muscle wasting and atrophy, not elsewhere classified, right lower leg: Secondary | ICD-10-CM | POA: Diagnosis not present

## 2021-07-08 DIAGNOSIS — M48062 Spinal stenosis, lumbar region with neurogenic claudication: Secondary | ICD-10-CM | POA: Diagnosis not present

## 2021-07-08 DIAGNOSIS — F0150 Vascular dementia without behavioral disturbance: Secondary | ICD-10-CM | POA: Diagnosis not present

## 2021-07-09 DIAGNOSIS — M5459 Other low back pain: Secondary | ICD-10-CM | POA: Diagnosis not present

## 2021-07-09 DIAGNOSIS — M62561 Muscle wasting and atrophy, not elsewhere classified, right lower leg: Secondary | ICD-10-CM | POA: Diagnosis not present

## 2021-07-09 DIAGNOSIS — F0150 Vascular dementia without behavioral disturbance: Secondary | ICD-10-CM | POA: Diagnosis not present

## 2021-07-09 DIAGNOSIS — M62562 Muscle wasting and atrophy, not elsewhere classified, left lower leg: Secondary | ICD-10-CM | POA: Diagnosis not present

## 2021-07-09 DIAGNOSIS — M48062 Spinal stenosis, lumbar region with neurogenic claudication: Secondary | ICD-10-CM | POA: Diagnosis not present

## 2021-07-09 DIAGNOSIS — R42 Dizziness and giddiness: Secondary | ICD-10-CM | POA: Diagnosis not present

## 2021-07-09 DIAGNOSIS — R278 Other lack of coordination: Secondary | ICD-10-CM | POA: Diagnosis not present

## 2021-07-09 DIAGNOSIS — Z8673 Personal history of transient ischemic attack (TIA), and cerebral infarction without residual deficits: Secondary | ICD-10-CM | POA: Diagnosis not present

## 2021-07-09 DIAGNOSIS — R41841 Cognitive communication deficit: Secondary | ICD-10-CM | POA: Diagnosis not present

## 2021-07-09 DIAGNOSIS — Z4789 Encounter for other orthopedic aftercare: Secondary | ICD-10-CM | POA: Diagnosis not present

## 2021-07-09 DIAGNOSIS — R2689 Other abnormalities of gait and mobility: Secondary | ICD-10-CM | POA: Diagnosis not present

## 2021-07-10 DIAGNOSIS — F0150 Vascular dementia without behavioral disturbance: Secondary | ICD-10-CM | POA: Diagnosis not present

## 2021-07-10 DIAGNOSIS — R278 Other lack of coordination: Secondary | ICD-10-CM | POA: Diagnosis not present

## 2021-07-10 DIAGNOSIS — Z4789 Encounter for other orthopedic aftercare: Secondary | ICD-10-CM | POA: Diagnosis not present

## 2021-07-10 DIAGNOSIS — M48062 Spinal stenosis, lumbar region with neurogenic claudication: Secondary | ICD-10-CM | POA: Diagnosis not present

## 2021-07-10 DIAGNOSIS — R2689 Other abnormalities of gait and mobility: Secondary | ICD-10-CM | POA: Diagnosis not present

## 2021-07-10 DIAGNOSIS — M6281 Muscle weakness (generalized): Secondary | ICD-10-CM | POA: Diagnosis not present

## 2021-07-10 DIAGNOSIS — M5459 Other low back pain: Secondary | ICD-10-CM | POA: Diagnosis not present

## 2021-07-10 DIAGNOSIS — M62562 Muscle wasting and atrophy, not elsewhere classified, left lower leg: Secondary | ICD-10-CM | POA: Diagnosis not present

## 2021-07-10 DIAGNOSIS — M62561 Muscle wasting and atrophy, not elsewhere classified, right lower leg: Secondary | ICD-10-CM | POA: Diagnosis not present

## 2021-07-13 DIAGNOSIS — Z8673 Personal history of transient ischemic attack (TIA), and cerebral infarction without residual deficits: Secondary | ICD-10-CM | POA: Diagnosis not present

## 2021-07-13 DIAGNOSIS — F0150 Vascular dementia without behavioral disturbance: Secondary | ICD-10-CM | POA: Diagnosis not present

## 2021-07-13 DIAGNOSIS — R42 Dizziness and giddiness: Secondary | ICD-10-CM | POA: Diagnosis not present

## 2021-07-13 DIAGNOSIS — R41841 Cognitive communication deficit: Secondary | ICD-10-CM | POA: Diagnosis not present

## 2021-07-14 DIAGNOSIS — Z4789 Encounter for other orthopedic aftercare: Secondary | ICD-10-CM | POA: Diagnosis not present

## 2021-07-14 DIAGNOSIS — M48062 Spinal stenosis, lumbar region with neurogenic claudication: Secondary | ICD-10-CM | POA: Diagnosis not present

## 2021-07-14 DIAGNOSIS — M6281 Muscle weakness (generalized): Secondary | ICD-10-CM | POA: Diagnosis not present

## 2021-07-14 DIAGNOSIS — F0150 Vascular dementia without behavioral disturbance: Secondary | ICD-10-CM | POA: Diagnosis not present

## 2021-07-14 DIAGNOSIS — M5459 Other low back pain: Secondary | ICD-10-CM | POA: Diagnosis not present

## 2021-07-15 DIAGNOSIS — M5459 Other low back pain: Secondary | ICD-10-CM | POA: Diagnosis not present

## 2021-07-15 DIAGNOSIS — R2689 Other abnormalities of gait and mobility: Secondary | ICD-10-CM | POA: Diagnosis not present

## 2021-07-15 DIAGNOSIS — M62562 Muscle wasting and atrophy, not elsewhere classified, left lower leg: Secondary | ICD-10-CM | POA: Diagnosis not present

## 2021-07-15 DIAGNOSIS — M62561 Muscle wasting and atrophy, not elsewhere classified, right lower leg: Secondary | ICD-10-CM | POA: Diagnosis not present

## 2021-07-15 DIAGNOSIS — R278 Other lack of coordination: Secondary | ICD-10-CM | POA: Diagnosis not present

## 2021-07-15 DIAGNOSIS — F0150 Vascular dementia without behavioral disturbance: Secondary | ICD-10-CM | POA: Diagnosis not present

## 2021-07-15 DIAGNOSIS — M48062 Spinal stenosis, lumbar region with neurogenic claudication: Secondary | ICD-10-CM | POA: Diagnosis not present

## 2021-07-15 DIAGNOSIS — Z4789 Encounter for other orthopedic aftercare: Secondary | ICD-10-CM | POA: Diagnosis not present

## 2021-07-16 DIAGNOSIS — F0150 Vascular dementia without behavioral disturbance: Secondary | ICD-10-CM | POA: Diagnosis not present

## 2021-07-16 DIAGNOSIS — M62562 Muscle wasting and atrophy, not elsewhere classified, left lower leg: Secondary | ICD-10-CM | POA: Diagnosis not present

## 2021-07-16 DIAGNOSIS — Z4789 Encounter for other orthopedic aftercare: Secondary | ICD-10-CM | POA: Diagnosis not present

## 2021-07-16 DIAGNOSIS — R278 Other lack of coordination: Secondary | ICD-10-CM | POA: Diagnosis not present

## 2021-07-16 DIAGNOSIS — M5459 Other low back pain: Secondary | ICD-10-CM | POA: Diagnosis not present

## 2021-07-16 DIAGNOSIS — M48062 Spinal stenosis, lumbar region with neurogenic claudication: Secondary | ICD-10-CM | POA: Diagnosis not present

## 2021-07-16 DIAGNOSIS — R2689 Other abnormalities of gait and mobility: Secondary | ICD-10-CM | POA: Diagnosis not present

## 2021-07-16 DIAGNOSIS — M62561 Muscle wasting and atrophy, not elsewhere classified, right lower leg: Secondary | ICD-10-CM | POA: Diagnosis not present

## 2021-07-17 DIAGNOSIS — F0150 Vascular dementia without behavioral disturbance: Secondary | ICD-10-CM | POA: Diagnosis not present

## 2021-07-17 DIAGNOSIS — M5459 Other low back pain: Secondary | ICD-10-CM | POA: Diagnosis not present

## 2021-07-17 DIAGNOSIS — M48062 Spinal stenosis, lumbar region with neurogenic claudication: Secondary | ICD-10-CM | POA: Diagnosis not present

## 2021-07-17 DIAGNOSIS — M6281 Muscle weakness (generalized): Secondary | ICD-10-CM | POA: Diagnosis not present

## 2021-07-17 DIAGNOSIS — Z4789 Encounter for other orthopedic aftercare: Secondary | ICD-10-CM | POA: Diagnosis not present

## 2021-07-21 DIAGNOSIS — F0150 Vascular dementia without behavioral disturbance: Secondary | ICD-10-CM | POA: Diagnosis not present

## 2021-07-21 DIAGNOSIS — M62562 Muscle wasting and atrophy, not elsewhere classified, left lower leg: Secondary | ICD-10-CM | POA: Diagnosis not present

## 2021-07-21 DIAGNOSIS — M62561 Muscle wasting and atrophy, not elsewhere classified, right lower leg: Secondary | ICD-10-CM | POA: Diagnosis not present

## 2021-07-21 DIAGNOSIS — Z4789 Encounter for other orthopedic aftercare: Secondary | ICD-10-CM | POA: Diagnosis not present

## 2021-07-21 DIAGNOSIS — M48062 Spinal stenosis, lumbar region with neurogenic claudication: Secondary | ICD-10-CM | POA: Diagnosis not present

## 2021-07-21 DIAGNOSIS — R278 Other lack of coordination: Secondary | ICD-10-CM | POA: Diagnosis not present

## 2021-07-21 DIAGNOSIS — R2689 Other abnormalities of gait and mobility: Secondary | ICD-10-CM | POA: Diagnosis not present

## 2021-07-21 DIAGNOSIS — M5459 Other low back pain: Secondary | ICD-10-CM | POA: Diagnosis not present

## 2021-07-21 DIAGNOSIS — M6281 Muscle weakness (generalized): Secondary | ICD-10-CM | POA: Diagnosis not present

## 2021-07-22 DIAGNOSIS — M62561 Muscle wasting and atrophy, not elsewhere classified, right lower leg: Secondary | ICD-10-CM | POA: Diagnosis not present

## 2021-07-22 DIAGNOSIS — R41841 Cognitive communication deficit: Secondary | ICD-10-CM | POA: Diagnosis not present

## 2021-07-22 DIAGNOSIS — M5459 Other low back pain: Secondary | ICD-10-CM | POA: Diagnosis not present

## 2021-07-22 DIAGNOSIS — R278 Other lack of coordination: Secondary | ICD-10-CM | POA: Diagnosis not present

## 2021-07-22 DIAGNOSIS — Z4789 Encounter for other orthopedic aftercare: Secondary | ICD-10-CM | POA: Diagnosis not present

## 2021-07-22 DIAGNOSIS — R2689 Other abnormalities of gait and mobility: Secondary | ICD-10-CM | POA: Diagnosis not present

## 2021-07-22 DIAGNOSIS — M48062 Spinal stenosis, lumbar region with neurogenic claudication: Secondary | ICD-10-CM | POA: Diagnosis not present

## 2021-07-22 DIAGNOSIS — F0150 Vascular dementia without behavioral disturbance: Secondary | ICD-10-CM | POA: Diagnosis not present

## 2021-07-22 DIAGNOSIS — Z8673 Personal history of transient ischemic attack (TIA), and cerebral infarction without residual deficits: Secondary | ICD-10-CM | POA: Diagnosis not present

## 2021-07-22 DIAGNOSIS — M62562 Muscle wasting and atrophy, not elsewhere classified, left lower leg: Secondary | ICD-10-CM | POA: Diagnosis not present

## 2021-07-22 DIAGNOSIS — R42 Dizziness and giddiness: Secondary | ICD-10-CM | POA: Diagnosis not present

## 2021-07-23 DIAGNOSIS — M48062 Spinal stenosis, lumbar region with neurogenic claudication: Secondary | ICD-10-CM | POA: Diagnosis not present

## 2021-07-23 DIAGNOSIS — Z4789 Encounter for other orthopedic aftercare: Secondary | ICD-10-CM | POA: Diagnosis not present

## 2021-07-23 DIAGNOSIS — M5459 Other low back pain: Secondary | ICD-10-CM | POA: Diagnosis not present

## 2021-07-23 DIAGNOSIS — F0150 Vascular dementia without behavioral disturbance: Secondary | ICD-10-CM | POA: Diagnosis not present

## 2021-07-23 DIAGNOSIS — M6281 Muscle weakness (generalized): Secondary | ICD-10-CM | POA: Diagnosis not present

## 2021-07-24 ENCOUNTER — Emergency Department (HOSPITAL_COMMUNITY)
Admission: EM | Admit: 2021-07-24 | Discharge: 2021-07-25 | Disposition: A | Discharge status: Home / Self Care | Payer: Medicare Other | Attending: Emergency Medicine | Admitting: Emergency Medicine

## 2021-07-24 ENCOUNTER — Emergency Department (HOSPITAL_COMMUNITY): Payer: Medicare Other

## 2021-07-24 ENCOUNTER — Encounter (HOSPITAL_COMMUNITY): Payer: Self-pay

## 2021-07-24 ENCOUNTER — Other Ambulatory Visit: Payer: Self-pay

## 2021-07-24 DIAGNOSIS — T1490XA Injury, unspecified, initial encounter: Secondary | ICD-10-CM

## 2021-07-24 DIAGNOSIS — W19XXXA Unspecified fall, initial encounter: Secondary | ICD-10-CM

## 2021-07-24 DIAGNOSIS — Z043 Encounter for examination and observation following other accident: Secondary | ICD-10-CM | POA: Diagnosis not present

## 2021-07-24 DIAGNOSIS — M47812 Spondylosis without myelopathy or radiculopathy, cervical region: Secondary | ICD-10-CM | POA: Diagnosis not present

## 2021-07-24 DIAGNOSIS — R0902 Hypoxemia: Secondary | ICD-10-CM | POA: Diagnosis not present

## 2021-07-24 DIAGNOSIS — S301XXA Contusion of abdominal wall, initial encounter: Secondary | ICD-10-CM | POA: Insufficient documentation

## 2021-07-24 DIAGNOSIS — Z981 Arthrodesis status: Secondary | ICD-10-CM | POA: Diagnosis not present

## 2021-07-24 DIAGNOSIS — E039 Hypothyroidism, unspecified: Secondary | ICD-10-CM | POA: Insufficient documentation

## 2021-07-24 DIAGNOSIS — Z79899 Other long term (current) drug therapy: Secondary | ICD-10-CM | POA: Insufficient documentation

## 2021-07-24 DIAGNOSIS — R41841 Cognitive communication deficit: Secondary | ICD-10-CM | POA: Diagnosis not present

## 2021-07-24 DIAGNOSIS — F0390 Unspecified dementia without behavioral disturbance: Secondary | ICD-10-CM | POA: Insufficient documentation

## 2021-07-24 DIAGNOSIS — Z8673 Personal history of transient ischemic attack (TIA), and cerebral infarction without residual deficits: Secondary | ICD-10-CM | POA: Diagnosis not present

## 2021-07-24 DIAGNOSIS — R42 Dizziness and giddiness: Secondary | ICD-10-CM | POA: Diagnosis not present

## 2021-07-24 DIAGNOSIS — S199XXA Unspecified injury of neck, initial encounter: Secondary | ICD-10-CM | POA: Diagnosis not present

## 2021-07-24 DIAGNOSIS — Z7982 Long term (current) use of aspirin: Secondary | ICD-10-CM | POA: Insufficient documentation

## 2021-07-24 DIAGNOSIS — S3991XA Unspecified injury of abdomen, initial encounter: Secondary | ICD-10-CM | POA: Diagnosis present

## 2021-07-24 DIAGNOSIS — S30811A Abrasion of abdominal wall, initial encounter: Secondary | ICD-10-CM | POA: Diagnosis not present

## 2021-07-24 DIAGNOSIS — Z7902 Long term (current) use of antithrombotics/antiplatelets: Secondary | ICD-10-CM | POA: Diagnosis not present

## 2021-07-24 DIAGNOSIS — R531 Weakness: Secondary | ICD-10-CM | POA: Diagnosis not present

## 2021-07-24 DIAGNOSIS — R5383 Other fatigue: Secondary | ICD-10-CM | POA: Diagnosis not present

## 2021-07-24 DIAGNOSIS — F0150 Vascular dementia without behavioral disturbance: Secondary | ICD-10-CM | POA: Diagnosis not present

## 2021-07-24 DIAGNOSIS — M47817 Spondylosis without myelopathy or radiculopathy, lumbosacral region: Secondary | ICD-10-CM | POA: Diagnosis not present

## 2021-07-24 HISTORY — DX: Hyperlipidemia, unspecified: E78.5

## 2021-07-24 HISTORY — DX: Hypothyroidism, unspecified: E03.9

## 2021-07-24 HISTORY — DX: Unspecified dementia, unspecified severity, without behavioral disturbance, psychotic disturbance, mood disturbance, and anxiety: F03.90

## 2021-07-24 HISTORY — DX: Cerebral infarction, unspecified: I63.9

## 2021-07-24 HISTORY — DX: Transient cerebral ischemic attack, unspecified: G45.9

## 2021-07-24 LAB — COMPREHENSIVE METABOLIC PANEL
ALT: 15 U/L (ref 0–44)
AST: 21 U/L (ref 15–41)
Albumin: 3.5 g/dL (ref 3.5–5.0)
Alkaline Phosphatase: 84 U/L (ref 38–126)
Anion gap: 8 (ref 5–15)
BUN: 9 mg/dL (ref 8–23)
CO2: 24 mmol/L (ref 22–32)
Calcium: 8.6 mg/dL — ABNORMAL LOW (ref 8.9–10.3)
Chloride: 101 mmol/L (ref 98–111)
Creatinine, Ser: 0.67 mg/dL (ref 0.44–1.00)
GFR, Estimated: >60 mL/min (ref >60)
Glucose, Bld: 151 mg/dL — ABNORMAL HIGH (ref 70–99)
Potassium: 3.3 mmol/L — ABNORMAL LOW (ref 3.5–5.1)
Sodium: 133 mmol/L — ABNORMAL LOW (ref 135–145)
Total Bilirubin: 0.7 mg/dL (ref 0.3–1.2)
Total Protein: 6.6 g/dL (ref 6.5–8.1)

## 2021-07-24 LAB — CBC
HCT: 39.9 % (ref 36.0–46.0)
Hemoglobin: 13 g/dL (ref 12.0–15.0)
MCH: 31.6 pg (ref 26.0–34.0)
MCHC: 32.6 g/dL (ref 30.0–36.0)
MCV: 97.1 fL (ref 80.0–100.0)
Platelets: 292 10*3/uL (ref 150–400)
RBC: 4.11 MIL/uL (ref 3.87–5.11)
RDW: 13.6 % (ref 11.5–15.5)
WBC: 8 10*3/uL (ref 4.0–10.5)
nRBC: 0 % (ref 0.0–0.2)

## 2021-07-24 MED ORDER — SODIUM CHLORIDE 0.9 % IV BOLUS
500.0000 mL | Freq: Once | INTRAVENOUS | Status: AC
  Administered 2021-07-25: 500 mL via INTRAVENOUS

## 2021-07-24 MED ORDER — IOHEXOL 350 MG/ML SOLN
100.0000 mL | Freq: Once | INTRAVENOUS | Status: AC | PRN
  Administered 2021-07-24: 100 mL via INTRAVENOUS

## 2021-07-24 NOTE — ED Triage Notes (Signed)
Level 2/Fall on thinners  Pt from [place] BIB EMS for c/o lethary and dizziness that started about 1 1/2 hours PTA. Pt had an unwitnessed fall around 0800 this am. Staff noted right flank bruising. Pt had no signs of head injury and pt reported slipping out of bed. Pt denied hitting head. Pt on Plavix. Pt denies pain on arrival. Hx of dementia. Pt at baseline.

## 2021-07-24 NOTE — ED Provider Notes (Signed)
[facility] EMERGENCY DEPARTMENT Provider Note   CSN: [id number] Arrival date & time: 07/24/21  1529     History Chief Complaint  Patient presents with   Lytle Michaels    Bianca Garcia is a 80 y.o. female. Level 5 caveat due to dementia.  Fall Patient brought from nursing home.  Reportedly found on the floor this morning.  Reported fall.  On Plavix.  Patient has dementia and really cannot tell me the events of what happened.  Reportedly sent in later when found "lethargic".  Upon arrival patient is at her reported baseline.  May have had some dizziness.  Reported bruising on right flank.  Came in as a level 2 trauma since patient is on Plavix.     Past Medical History:  Diagnosis Date   Cerebral infarction ([place])    Dementia ([place])    Hyperlipidemia    Hypothyroidism    TIA (transient ischemic attack)     There are no problems to display for this patient.   History reviewed. No pertinent surgical history.   OB History   No obstetric history on file.     No family history on file.     Home Medications Prior to Admission medications   Medication Sig Start Date End Date Taking? Authorizing Provider  acetaminophen (TYLENOL) 325 MG tablet Take 650 mg by mouth as directed. Take 2 tablets (650 mg) TID & Take 2 tablets (650 mg) BID PRN PAIN   Yes [provider]  aspirin EC 81 MG tablet Take 81 mg by mouth daily. Swallow whole.   Yes [provider]  calcium carbonate (OS-CAL - DOSED IN MG OF ELEMENTAL CALCIUM) 1250 (500 Ca) MG tablet Take 1 tablet by mouth daily.   Yes [provider]  calcium carbonate (TUMS) 500 MG chewable tablet Chew 2 tablets by mouth 2 (two) times daily as needed for indigestion or heartburn.   Yes [provider]  clonazePAM (KLONOPIN) 0.5 MG tablet Take 0.5 mg by mouth 2 (two) times daily as needed for anxiety. 07/13/21  Yes [provider]  clopidogrel (PLAVIX) 75 MG tablet Take 75 mg by  mouth daily. 07/05/21  Yes [provider]  donepezil (ARICEPT) 10 MG tablet Take 10 mg by mouth daily. 07/05/21  Yes [provider]  gabapentin (NEURONTIN) 100 MG capsule Take 100 mg by mouth daily. 07/07/21  Yes [provider]  gabapentin (NEURONTIN) 300 MG capsule Take 300 mg by mouth 2 (two) times daily. 07/10/21  Yes [provider]  levothyroxine (SYNTHROID) 88 MCG tablet Take 88 mcg by mouth daily. 07/22/21  Yes [provider]  melatonin 5 MG TABS Take 5 mg by mouth at bedtime as needed (insomnia).   Yes [provider]  Multiple Vitamins-Minerals (PRESERVISION AREDS 2) CAPS Take 1 capsule by mouth in the morning and at bedtime.   Yes [provider]  omeprazole (PRILOSEC) 40 MG capsule Take 40 mg by mouth daily. 06/29/21  Yes [provider]  propranolol (INDERAL) 40 MG tablet Take 40 mg by mouth 2 (two) times daily. 07/10/21  Yes [provider]  rosuvastatin (CRESTOR) 5 MG tablet Take 5 mg by mouth daily. 07/23/21  Yes [provider]    Allergies    Aciphex [rabeprazole], Codeine, Cymbalta [duloxetine hcl], Lexapro [escitalopram], Pneumovax [pneumococcal polysaccharide vaccine], Prevacid [lansoprazole], Sertraline, Tetracyclines & related, and Welchol [colesevelam]  Review of Systems   Review of Systems  Unable to perform ROS: Dementia  Physical Exam Updated Vital Signs BP (!) 148/75   Pulse 69   Temp 98.3 F (36.8 C) (Oral)   Resp (!) 25   Ht 5\' 4"  (1.626 m)   SpO2 96%   Physical Exam Vitals and nursing note reviewed.  HENT:     Head: Atraumatic.  Eyes:     Pupils: Pupils are equal, round, and reactive to light.  Cardiovascular:     Rate and Rhythm: Regular rhythm.  Pulmonary:     Breath sounds: No wheezing or rhonchi.  Abdominal:     Tenderness: There is no abdominal tenderness.  Musculoskeletal:     Cervical back: Neck supple.     Comments: Some ecchymosis right flank  area.  Skin:    General: Skin is warm.     Capillary Refill: Capillary refill takes less than 2 seconds.  Neurological:     Mental Status: She is alert.     Comments: Awake and pleasant, but some confusion.  Finger-nose intact bilaterally.  May have some mild nystagmus with end gaze bilaterally.    ED Results / Procedures / Treatments   Labs (all labs ordered are listed, but only abnormal results are displayed) Labs Reviewed  COMPREHENSIVE METABOLIC PANEL - Abnormal; Notable for the following components:      Result Value   Sodium 133 (*)    Potassium 3.3 (*)    Glucose, Bld 151 (*)    Calcium 8.6 (*)    All other components within normal limits  CBC  URINALYSIS, ROUTINE W REFLEX MICROSCOPIC    EKG None  Radiology CT Angio Head W or Wo Contrast  Result Date: 07/24/2021 CLINICAL DATA:  Unwitnessed fall at 8 o'clock a.m. today. Lethargy in dizziness beginning 1-1/2 hours ago. EXAM: CT ANGIOGRAPHY HEAD AND NECK TECHNIQUE: Multidetector CT imaging of the head and neck was performed using the standard protocol during bolus administration of intravenous contrast. Multiplanar CT image reconstructions and MIPs were obtained to evaluate the vascular anatomy. Carotid stenosis measurements (when applicable) are obtained utilizing NASCET criteria, using the distal internal carotid diameter as the denominator. CONTRAST:  138mL OMNIPAQUE IOHEXOL 350 MG/ML SOLN COMPARISON:  MR head and CT head 12/21/2017. FINDINGS: CT HEAD FINDINGS Brain: Moderate atrophy and white matter disease demonstrates some progression from the prior study. No acute infarct, hemorrhage, or mass lesion is present. No significant extraaxial fluid collection is present. The ventricles are proportionate to the degree of atrophy. The brainstem and cerebellum are within normal limits. Vascular: Atherosclerotic changes are present within the cavernous internal carotid arteries bilaterally. No hyperdense vessel is present. Skull:  Calvarium is intact. No focal lytic or blastic lesions are present. No significant extracranial soft tissue lesion is present. Sinuses: The paranasal sinuses and mastoid air cells are clear. Bilateral lens replacements are noted. Orbits: Bilateral lens replacements are noted. Globes and orbits are otherwise unremarkable. Review of the MIP images confirms the above findings CTA NECK FINDINGS Aortic arch: 3 vessel arch configuration is present. Minimal atherosclerotic changes are present in the distal aorta without aneurysm or stenosis. Right carotid system: The right common carotid artery is within normal limits. Minimal calcifications are present at the bifurcation. Mild tortuosity is present in the cervical right ICA. No significant stenosis is present. Left carotid system: Moderate proximal left common carotid artery tortuosity is present. Dense atherosclerotic calcifications are present in the proximal left ICA without a significant stenosis relative to the more distal ICA. Vertebral arteries: The right vertebral artery is the dominant vessel.  Both vertebral arteries originate from the subclavian arteries without significant stenosis. Moderate tortuosity is present on the left. There is some segmental irregularity of the left vertebral artery without a significant stenosis in the neck. Skeleton: Cervical fusion present C5-6 and C6-7. Degenerative disc disease present at C3-4 and C4-5. 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 No focal osseous lesions are present. Other neck: Soft tissues the neck are otherwise unremarkable. Salivary glands are within normal limits. Thyroid is normal. No significant adenopathy is present. No focal mucosal or submucosal lesions are present. Upper chest: Mild ground-glass attenuation is present the lung apices. No nodule or mass lesion is present. The airways are patent. Review of the MIP images confirms the above findings CTA HEAD FINDINGS Anterior circulation: The  internal carotid arteries are within normal limits through the ICA termini. The A1 and M1 segments are normal. The ACA and MCA branch vessels are normal. MCA bifurcations are within normal limits. Posterior circulation: The vertebral arteries are codominant. PICA origins are visualized and normal. Vertebrobasilar junction is normal. Both posterior cerebral arteries originate from basilar tip. PCA branch vessels are within normal limits. Venous sinuses: The dural sinuses are patent. The straight sinus deep cerebral veins are intact. Cortical veins are within normal limits. No significant vascular malformation is evident. Anatomic variants: None Review of the MIP images confirms the above findings IMPRESSION: 1. No emergent large vessel occlusion.  No acute trauma 2. No significant proximal stenosis, aneurysm, or branch vessel occlusion within the Circle of Willis. 3. Progressive atrophy and white matter disease, likely within normal limits for age. 4. No acute intracranial abnormality. 5. Dense atherosclerotic calcifications at the left carotid bifurcation and proximal left ICA without significant stenosis relative to the more distal ICA. 6. Minimal atherosclerotic changes at the right carotid bifurcation without significant stenosis. 7. Tortuosity of the cervical internal carotid arteries bilaterally without significant stenosis. This is nonspecific, but most commonly seen in the setting of chronic hypertension. 8. Multilevel spondylosis of the cervical spine. Electronically Signed   By: San Morelle M.D.   On: 07/24/2021 18:55   CT Angio Neck W and/or Wo Contrast  Result Date: 07/24/2021 CLINICAL DATA:  Unwitnessed fall at 8 o'clock a.m. today. Lethargy in dizziness beginning 1-1/2 hours ago. EXAM: CT ANGIOGRAPHY HEAD AND NECK TECHNIQUE: Multidetector CT imaging of the head and neck was performed using the standard protocol during bolus administration of intravenous contrast. Multiplanar CT image  reconstructions and MIPs were obtained to evaluate the vascular anatomy. Carotid stenosis measurements (when applicable) are obtained utilizing NASCET criteria, using the distal internal carotid diameter as the denominator. CONTRAST:  15mL OMNIPAQUE IOHEXOL 350 MG/ML SOLN COMPARISON:  MR head and CT head 12/21/2017. FINDINGS: CT HEAD FINDINGS Brain: Moderate atrophy and white matter disease demonstrates some progression from the prior study. No acute infarct, hemorrhage, or mass lesion is present. No significant extraaxial fluid collection is present. The ventricles are proportionate to the degree of atrophy. The brainstem and cerebellum are within normal limits. Vascular: Atherosclerotic changes are present within the cavernous internal carotid arteries bilaterally. No hyperdense vessel is present. Skull: Calvarium is intact. No focal lytic or blastic lesions are present. No significant extracranial soft tissue lesion is present. Sinuses: The paranasal sinuses and mastoid air cells are clear. Bilateral lens replacements are noted. Orbits: Bilateral lens replacements are noted. Globes and orbits are otherwise unremarkable. Review of the MIP images confirms the above findings CTA NECK FINDINGS Aortic arch: 3 vessel arch configuration is  present. Minimal atherosclerotic changes are present in the distal aorta without aneurysm or stenosis. Right carotid system: The right common carotid artery is within normal limits. Minimal calcifications are present at the bifurcation. Mild tortuosity is present in the cervical right ICA. No significant stenosis is present. Left carotid system: Moderate proximal left common carotid artery tortuosity is present. Dense atherosclerotic calcifications are present in the proximal left ICA without a significant stenosis relative to the more distal ICA. Vertebral arteries: The right vertebral artery is the dominant vessel. Both vertebral arteries originate from the subclavian arteries  without significant stenosis. Moderate tortuosity is present on the left. There is some segmental irregularity of the left vertebral artery without a significant stenosis in the neck. Skeleton: Cervical fusion present C5-6 and C6-7. Degenerative disc disease present at C3-4 and C4-5. 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 No focal osseous lesions are present. Other neck: Soft tissues the neck are otherwise unremarkable. Salivary glands are within normal limits. Thyroid is normal. No significant adenopathy is present. No focal mucosal or submucosal lesions are present. Upper chest: Mild ground-glass attenuation is present the lung apices. No nodule or mass lesion is present. The airways are patent. Review of the MIP images confirms the above findings CTA HEAD FINDINGS Anterior circulation: The internal carotid arteries are within normal limits through the ICA termini. The A1 and M1 segments are normal. The ACA and MCA branch vessels are normal. MCA bifurcations are within normal limits. Posterior circulation: The vertebral arteries are codominant. PICA origins are visualized and normal. Vertebrobasilar junction is normal. Both posterior cerebral arteries originate from basilar tip. PCA branch vessels are within normal limits. Venous sinuses: The dural sinuses are patent. The straight sinus deep cerebral veins are intact. Cortical veins are within normal limits. No significant vascular malformation is evident. Anatomic variants: None Review of the MIP images confirms the above findings IMPRESSION: 1. No emergent large vessel occlusion.  No acute trauma 2. No significant proximal stenosis, aneurysm, or branch vessel occlusion within the Circle of Willis. 3. Progressive atrophy and white matter disease, likely within normal limits for age. 4. No acute intracranial abnormality. 5. Dense atherosclerotic calcifications at the left carotid bifurcation and proximal left ICA without significant stenosis  relative to the more distal ICA. 6. Minimal atherosclerotic changes at the right carotid bifurcation without significant stenosis. 7. Tortuosity of the cervical internal carotid arteries bilaterally without significant stenosis. This is nonspecific, but most commonly seen in the setting of chronic hypertension. 8. Multilevel spondylosis of the cervical spine. Electronically Signed   By: San Morelle M.D.   On: 07/24/2021 18:55   CT ABDOMEN PELVIS W CONTRAST  Result Date: 07/24/2021 CLINICAL DATA:  Lethargy, dizziness, fell this morning, right flank bruising EXAM: CT ABDOMEN AND PELVIS WITH CONTRAST TECHNIQUE: Multidetector CT imaging of the abdomen and pelvis was performed using the standard protocol following bolus administration of intravenous contrast. CONTRAST:  185mL OMNIPAQUE IOHEXOL 350 MG/ML SOLN COMPARISON:  12/17/2015 FINDINGS: Lower chest: No acute pleural or parenchymal lung disease. Hepatobiliary: No focal liver abnormality is seen. No gallstones, gallbladder wall thickening, or biliary dilatation. Pancreas: Unremarkable. No pancreatic ductal dilatation or surrounding inflammatory changes. Spleen: Normal in size without focal abnormality. Adrenals/Urinary Tract: Adrenal glands are unremarkable. Kidneys are normal, without renal calculi, focal lesion, or hydronephrosis. Bladder is unremarkable. Stomach/Bowel: No bowel obstruction or ileus. No bowel wall thickening or inflammatory change. Vascular/Lymphatic: Aortic atherosclerosis. No enlarged abdominal or pelvic lymph nodes. Reproductive: Uterus and bilateral adnexa  are unremarkable. Other: No free fluid or free gas.  No abdominal wall hernia. Musculoskeletal: No acute or destructive bony lesions. Prominent spondylosis within the thoracolumbar junction and lumbosacral junction. Postsurgical changes within the lower lumbar spine. Reconstructed images demonstrate no additional findings. IMPRESSION: 1. No acute intra-abdominal or intrapelvic  process. 2.  Aortic Atherosclerosis (ICD10-I70.0). Electronically Signed   By: Randa Ngo M.D.   On: 07/24/2021 18:58   DG Pelvis Portable  Result Date: 07/24/2021 CLINICAL DATA:  Fall. EXAM: PORTABLE PELVIS 1-2 VIEWS COMPARISON:  None. FINDINGS: There is no evidence of pelvic fracture or diastasis. No pelvic bone lesions are seen. IMPRESSION: Negative. Electronically Signed   By: Marijo Conception M.D.   On: 07/24/2021 15:53   CT C-SPINE NO CHARGE  Result Date: 07/24/2021 CLINICAL DATA:  Trauma history of fall EXAM: CT Cervical Spine without contrast TECHNIQUE: Multiplanar CT images of the cervical spine were reconstructed from contemporary CT of the Neck. CONTRAST:  None or No additional COMPARISON:  CT cervical spine 03/03/2016 FINDINGS: Alignment: No subluxation.  Facet alignment within normal limits. Skull base and vertebrae: No acute fracture. No primary bone lesion or focal pathologic process. Soft tissues and spinal canal: No prevertebral fluid or swelling. No visible canal hematoma. Disc levels: Anterior fusion C5 through C7 with anterior plate, fixating screws and interbody devices. Moderate degenerative changes at C3-C4 and C4-C5. Facet degenerative changes at multiple levels. Upper chest: Negative. Other: None IMPRESSION: Post fusion changes C5 through C7.  No acute osseous abnormality Electronically Signed   By: Donavan Foil M.D.   On: 07/24/2021 19:01   DG Chest Portable 1 View  Result Date: 07/24/2021 CLINICAL DATA:  Fall. EXAM: PORTABLE CHEST 1 VIEW COMPARISON:  September 20, 2017. FINDINGS: The heart size and mediastinal contours are within normal limits. Both lungs are clear. The visualized skeletal structures are unremarkable. IMPRESSION: No active disease. Electronically Signed   By: Marijo Conception M.D.   On: 07/24/2021 15:51    Procedures Procedures   Medications Ordered in ED Medications  iohexol (OMNIPAQUE) 350 MG/ML injection 100 mL (100 mLs Intravenous Contrast Given  07/24/21 1821)    ED Course  I have reviewed the triage vital signs and the nursing notes.  Pertinent labs & imaging results that were available during my care of the patient were reviewed by me and considered in my medical decision making (see chart for details).    MDM Rules/Calculators/A&P                           Patient presented as a level 2 trauma.  Reportedly had fall on Plavix.  Reportedly had confusion and was "lethargic".  Upon arrival however good mental status that appears to be at her baseline.  Imaging done due to anticoagulation.  Has right flank contusion but negative abdominal imaging.  Head CT reassuring.  However with the reported mental status change urine ordered.  However has been having difficulty getting it.  Lab work reassuring.  Care turned over to Dr. Betsey Holiday.  Likely discharge home Final Clinical Impression(s) / ED Diagnoses Final diagnoses:  Trauma    Rx / DC Orders ED Discharge Orders     None        Davonna Belling, MD 07/25/21 0001

## 2021-07-25 DIAGNOSIS — Z7401 Bed confinement status: Secondary | ICD-10-CM | POA: Diagnosis not present

## 2021-07-25 DIAGNOSIS — R404 Transient alteration of awareness: Secondary | ICD-10-CM | POA: Diagnosis not present

## 2021-07-25 DIAGNOSIS — S301XXA Contusion of abdominal wall, initial encounter: Secondary | ICD-10-CM | POA: Diagnosis not present

## 2021-07-25 LAB — URINALYSIS, ROUTINE W REFLEX MICROSCOPIC
Bacteria, UA: NONE SEEN
Bilirubin Urine: NEGATIVE
Glucose, UA: NEGATIVE mg/dL
Hgb urine dipstick: NEGATIVE
Ketones, ur: 20 mg/dL — AB
Leukocytes,Ua: NEGATIVE
Nitrite: NEGATIVE
Protein, ur: NEGATIVE mg/dL
Specific Gravity, Urine: 1.034 — ABNORMAL HIGH (ref 1.005–1.030)
pH: 6 (ref 5.0–8.0)

## 2021-07-25 NOTE — ED Notes (Signed)
Pt given crackers and sprite, will order lunch tray

## 2021-07-25 NOTE — ED Provider Notes (Signed)
Patient presented after fall.  Patient's work-up in the ED is without evidence of significant acute pathology that would require admission.  Patient is appropriate for outpatient management and discharge.   Valarie Merino, MD 07/25/21 [phone]

## 2021-07-25 NOTE — ED Notes (Signed)
Pt adjusted in the bed and monitor and pulse ox and and bp cuff.  Pt was restless in the bed on arrival and was found with IV out stated is was an accident.

## 2021-07-25 NOTE — Discharge Instructions (Addendum)
Return for any problem.  ?

## 2021-07-27 ENCOUNTER — Non-Acute Institutional Stay: Payer: Medicare Other | Admitting: Adult Health

## 2021-07-27 ENCOUNTER — Encounter: Payer: Self-pay | Admitting: Adult Health

## 2021-07-27 ENCOUNTER — Encounter: Payer: Self-pay | Admitting: Internal Medicine

## 2021-07-27 DIAGNOSIS — M62562 Muscle wasting and atrophy, not elsewhere classified, left lower leg: Secondary | ICD-10-CM | POA: Diagnosis not present

## 2021-07-27 DIAGNOSIS — R2689 Other abnormalities of gait and mobility: Secondary | ICD-10-CM | POA: Diagnosis not present

## 2021-07-27 DIAGNOSIS — R531 Weakness: Secondary | ICD-10-CM | POA: Diagnosis not present

## 2021-07-27 DIAGNOSIS — M48062 Spinal stenosis, lumbar region with neurogenic claudication: Secondary | ICD-10-CM

## 2021-07-27 DIAGNOSIS — E860 Dehydration: Secondary | ICD-10-CM | POA: Diagnosis not present

## 2021-07-27 DIAGNOSIS — M62561 Muscle wasting and atrophy, not elsewhere classified, right lower leg: Secondary | ICD-10-CM | POA: Diagnosis not present

## 2021-07-27 DIAGNOSIS — F0150 Vascular dementia without behavioral disturbance: Secondary | ICD-10-CM | POA: Diagnosis not present

## 2021-07-27 DIAGNOSIS — M5459 Other low back pain: Secondary | ICD-10-CM | POA: Diagnosis not present

## 2021-07-27 DIAGNOSIS — Z4789 Encounter for other orthopedic aftercare: Secondary | ICD-10-CM | POA: Diagnosis not present

## 2021-07-27 DIAGNOSIS — R278 Other lack of coordination: Secondary | ICD-10-CM | POA: Diagnosis not present

## 2021-07-27 NOTE — Progress Notes (Signed)
Location:  Occupational psychologist of Service:  ALF (13) Provider:   Cindi Carbon, [place] [phone]   Virgie Dad, MD  Patient Care Team: Virgie Dad, MD as PCP - General (Internal Medicine) Nahser, Wonda Cheng, MD as PCP - Cardiology (Cardiology) Clarene Essex, MD as Consulting Physician (Gastroenterology) Marcial Pacas, MD as Consulting Physician (Neurology) Kristeen Miss, MD as Consulting Physician (Neurosurgery) [place], Amy, MD as Consulting Physician (Dermatology) Megan Salon, MD as Consulting Physician (Gynecology) [place], Heather, [place] (Optometry) Virgie Dad, MD (Internal Medicine)  Extended Emergency Contact Information Primary Emergency Contact: Mattioli,Robert J Address: [address]          [place],  [address] [name] Phone: [phone] Work Phone: [phone] Mobile Phone: [phone] Relation: Spouse Secondary Emergency Contact: [place] Livings of [place] Phone: [phone] Mobile Phone: [phone] Relation: Son  Code Status:  DNR Goals of care: Advanced Directive information Advanced Directives 07/24/2021  Does Patient Have a Medical Advance Directive? Yes  Type of Advance Directive Out of facility DNR (pink MOST or yellow form)  Does patient want to make changes to medical advance directive? Yes (ED - Information included in AVS)  Copy of [place] in Chart? -  Would patient like information on creating a medical advance directive? -  Pre-existing out of facility DNR order (yellow form or pink MOST form) -     Chief Complaint  Patient presents with   Acute Visit    weakness    HPI:  Pt is a 80 y.o. female seen today for an acute visit for weakness. Pt has a PMH significat for CVA, dementia, spinal stenosis, hypothyroidism, tremor on propranolol, among others.   She was seen in the ED due to a fall (she was found on the floor) and  was sent to the ED for concern for head bleed due to lethargy after the fall. They are not sure if she hit her head. She was on plavix and asa. CT of the head as negative for acute bleed. CXR negative.  NA 133 K 3.3, CBC ok. UA neg for infection but had elevated specific gravity and ketones in urine.  CT of the abd neg for acute process.  She had covid last month and has recovered with slight dry cough. During that time she was weaker and needed more assistance. Also recently had laminectomy for lumbar spinal stenosis. Continues on neurontin, denies pain. 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 Street of back injections by Dr. Brien Few 2016 with good relief, history of cervical DD with possiblity of surgery by Dr. Trenton Gammon.   GERD (gastroesophageal reflux disease)    IBS with moderate refluc seen on upper GI study form January 2011 Dr. Watt Climes   HTN (hypertension)    no meds   Hyperlipidemia    Hypothyroidism    IBS (irritable bowel syndrome)    Imbalance    Impingement syndrome of right shoulder 01/05/2013   Mass of left side of neck    Memory loss  Mood disorder (HCC)    MVP (mitral valve prolapse)    Hx of    Osteopenia    Peripheral edema    Right rotator cuff tear 01/05/2013   Sciatic pain    recurrent   Seasonal allergies    Stroke [facility])    Tension headache    TIA (transient ischemic attack)    Tremor    Past Surgical History:  Procedure Laterality Date   ANTERIOR CERVICAL DECOMP/DISCECTOMY FUSION  04/2015    Dr. Pamala Hurry, [place]  2004   which revealed smooth and normal coronary arteries   CARPAL TUNNEL RELEASE   2000   lt   CATARACT EXTRACTION W/ INTRAOCULAR LENS  IMPLANT, BILATERAL  1/17   Dr. Lucita Ferrara   COSMETIC SURGERY  2010   eyes-facial   DENTAL SURGERY  1960   LASIK     LOOP RECORDER INSERTION N/A 09/22/2017   Procedure: LOOP RECORDER INSERTION;  Surgeon: Constance Haw, MD;  Location: [place] [facility];  Service: Cardiovascular;  Laterality: N/A;   LUMBAR LAMINECTOMY/DECOMPRESSION MICRODISCECTOMY N/A 06/08/2021   Procedure: Laminectomy and Foraminotomy - L4-L5, L5-S1;  Surgeon: Earnie Larsson, MD;  Location: [place];  Service: Neurosurgery;  Laterality: N/A;   SHOULDER ARTHROSCOPY WITH ROTATOR CUFF REPAIR Right 01/05/2013   Procedure: SHOULDER ARTHROSCOPY WITH ARTHROSCOPIC  ROTATOR CUFF REPAIR, ACROMIOPLASTY, EXTENSIVE DEBRIDEMENT;  Surgeon: Johnny Bridge, MD;  Location: [place];  Service: Orthopedics;  Laterality: Right;   TEE WITHOUT CARDIOVERSION N/A 09/22/2017   Procedure: TRANSESOPHAGEAL ECHOCARDIOGRAM (TEE);  Surgeon: Sanda Klein, MD;  Location: [facility];  Service: Cardiovascular;  Laterality: N/A;   TONSILLECTOMY     TUBAL LIGATION      Allergies  Allergen Reactions   Fluticasone Other (See Comments)    DRY EYES   Aciphex [Rabeprazole]     Critical    Codeine Nausea Only   Codeine    Cymbalta [Duloxetine Hcl]     Critical    Lexapro [Escitalopram]     critical   Lipitor [Atorvastatin Calcium]     Intolerant    Pneumovax [Pneumococcal Polysaccharide Vaccine]     moderate   Prevacid [Lansoprazole]     Unknown, listed on MAR   Rosuvastatin     Intolerant    Sertraline     critical   Statins     Critical    Sulfa Antibiotics     vomiting   Tetracyclines & Related     Morphine Sulfate   Welchol [Colesevelam]     Unknown, listed on MAR   Pneumovax 23 [Pneumococcal Vac Polyvalent] Rash    Injection site reaction    Outpatient Encounter Medications as of 07/27/2021  Medication Sig   acetaminophen (TYLENOL) 325 MG tablet Take 650  mg by mouth See admin instructions. Take 650 mg 3 times daily, may take 650 mg 2 times daily as needed for pain   acetaminophen (TYLENOL) 325 MG tablet Take 650 mg by mouth as directed. Take 2 tablets (650 mg) TID & Take 2 tablets (650 mg) BID PRN PAIN   aspirin EC 81 MG tablet Take 81 mg by mouth daily. Swallow whole.   Calcium Carb-Cholecalciferol (CALCIUM 500 + D PO) Take 1 tablet by mouth daily.   calcium carbonate (TUMS - DOSED IN MG ELEMENTAL CALCIUM) 500 MG chewable tablet Chew 2 tablets by mouth 2 (two) times daily as needed for indigestion or heartburn.   clonazePAM (KLONOPIN) 0.5 MG tablet Take 0.25  mg by mouth 2 (two) times daily as needed (for anxiety and shakiness).   clopidogrel (PLAVIX) 75 MG tablet Take 1 tablet (75 mg total) by mouth daily.   donepezil (ARICEPT) 10 MG tablet Take 10 mg by mouth daily.   gabapentin (NEURONTIN) 100 MG capsule Take 100 mg by mouth daily in the afternoon.   gabapentin (NEURONTIN) 300 MG capsule Take 1 capsule (300 mg total) by mouth in the morning and at bedtime.   guaiFENesin (ROBITUSSIN) 100 MG/5ML liquid Take 200 mg by mouth every 6 (six) hours as needed for cough.   levothyroxine (SYNTHROID) 88 MCG tablet Take 88 mcg by mouth daily before breakfast.   melatonin 5 MG TABS Take 5 mg by mouth at bedtime as needed (sleep).   Multiple Vitamins-Minerals (PRESERVISION AREDS PO) Take 1 tablet by mouth 2 (two) times daily.    omeprazole (PRILOSEC) 40 MG capsule Take 40 mg by mouth daily.   propranolol (INDERAL) 40 MG tablet Take 40 mg by mouth 2 (two) times daily.   rosuvastatin (CRESTOR) 5 MG tablet Take 1 tablet (5 mg total) by mouth daily.   [DISCONTINUED] aspirin EC 81 MG tablet Take 81 mg by mouth daily. Swallow whole.   [DISCONTINUED] calcium carbonate (OS-CAL - DOSED IN MG OF ELEMENTAL CALCIUM) 1250 (500 Ca) MG tablet Take 1 tablet by mouth daily.   [DISCONTINUED] calcium carbonate (TUMS) 500 MG chewable tablet Chew 2 tablets by mouth 2 (two) times  daily as needed for indigestion or heartburn.   [DISCONTINUED] clonazePAM (KLONOPIN) 0.5 MG tablet Take 0.5 mg by mouth 2 (two) times daily as needed for anxiety.   [DISCONTINUED] clopidogrel (PLAVIX) 75 MG tablet Take 75 mg by mouth daily.   [DISCONTINUED] donepezil (ARICEPT) 10 MG tablet Take 10 mg by mouth daily.   [DISCONTINUED] gabapentin (NEURONTIN) 100 MG capsule Take 100 mg by mouth daily.   [DISCONTINUED] gabapentin (NEURONTIN) 300 MG capsule Take 300 mg by mouth 2 (two) times daily.   [DISCONTINUED] levothyroxine (SYNTHROID) 88 MCG tablet Take 88 mcg by mouth daily.   [DISCONTINUED] melatonin 5 MG TABS Take 5 mg by mouth at bedtime as needed (insomnia).   [DISCONTINUED] Multiple Vitamins-Minerals (PRESERVISION AREDS 2) CAPS Take 1 capsule by mouth in the morning and at bedtime.   [DISCONTINUED] omeprazole (PRILOSEC) 40 MG capsule Take 40 mg by mouth daily.   [DISCONTINUED] propranolol (INDERAL) 40 MG tablet Take 1 tablet (40 mg total) by mouth 2 (two) times daily. Please call [phone] to schedule an appt.   [DISCONTINUED] rosuvastatin (CRESTOR) 5 MG tablet Take 5 mg by mouth daily.   No facility-administered encounter medications on file as of 07/27/2021.    Review of Systems  Constitutional:  Positive for activity change and fatigue. Negative for appetite change, chills, diaphoresis, fever and unexpected weight change (lost 6 lbs).  HENT:  Negative for congestion.   Respiratory:  Negative for cough (dry), shortness of breath and wheezing.   Cardiovascular:  Negative for chest pain, palpitations and leg swelling.  Gastrointestinal:  Negative for abdominal distention, abdominal pain, constipation and diarrhea.  Genitourinary:  Negative for difficulty urinating and dysuria.  Musculoskeletal:  Positive for gait problem. Negative for arthralgias, back pain, joint swelling and myalgias.  Neurological:  Positive for weakness. Negative for dizziness, tremors, seizures, syncope, facial  asymmetry, speech difficulty, light-headedness, numbness and headaches.  Psychiatric/Behavioral:  Positive for confusion. Negative for agitation and behavioral problems.    Immunization History  Administered Date(s) Administered   Influenza, High Dose Seasonal  PF 07/23/2017, 07/03/2019   Influenza-Unspecified 05/27/2010, 08/03/2010, 07/22/2011, 07/01/2012, 06/22/2013, 07/18/2020   Janssen (J&J) SARS-COV-2 Vaccination 11/06/2019   Moderna Sars-Covid-2 Vaccination 08/07/2020   PFIZER(Purple Top)SARS-COV-2 Vaccination 10/09/2019   Pneumococcal Conjugate-13 02/08/2015   Pneumococcal Polysaccharide-23 05/28/2006, 10/13/2011   Pneumococcal-Unspecified 10/17/2013   Td 03/29/2002   Tdap 09/27/2001, 04/25/2002, 08/18/2012   Zoster, Live 08/30/2006   Zoster, Unspecified 08/30/2006   Pertinent  Health Maintenance Due  Topic Date Due   INFLUENZA VACCINE  04/27/2021   DEXA SCAN  Completed   Fall Risk 06/08/2021 06/08/2021 06/08/2021 07/24/2021 07/25/2021  Falls in the past year? - - - - -  Was there an injury with Fall? - - - - -  Fall Risk Category Calculator - - - - -  Fall Risk Category - - - - -  Patient Fall Risk Level High fall risk High fall risk High fall risk Moderate fall risk High fall risk  Fall risk Follow up - - - - -   Functional Status Survey:    Vitals:   07/27/21 1004  BP: 125/66  Pulse: (!) 55  Resp: 16  Temp: (!) 97.2 F (36.2 C)  SpO2: 94%   There is no height or weight on file to calculate BMI. Physical Exam Vitals and nursing note reviewed.  Constitutional:      General: She is not in acute distress.    Appearance: She is not diaphoretic.  HENT:     Head: Normocephalic and atraumatic.     Mouth/Throat:     Mouth: Mucous membranes are moist.     Pharynx: Oropharynx is clear.  Eyes:     Conjunctiva/sclera: Conjunctivae normal.     Pupils: Pupils are equal, round, and reactive to light.  Neck:     Vascular: No JVD.  Cardiovascular:     Rate and Rhythm:  Normal rate and regular rhythm.     Heart sounds: No murmur heard. Pulmonary:     Effort: Pulmonary effort is normal. No respiratory distress.     Breath sounds: Rales (right base) present. No wheezing.  Abdominal:     General: Bowel sounds are normal. There is no distension.     Palpations: Abdomen is soft.     Tenderness: There is no abdominal tenderness.  Musculoskeletal:     Right lower leg: No edema.     Left lower leg: No edema.     Comments: Neg SLR  Skin:    General: Skin is warm and dry.  Neurological:     General: No focal deficit present.     Mental Status: She is alert. Mental status is at baseline.     Cranial Nerves: No cranial nerve deficit.  Psychiatric:        Mood and Affect: Mood normal.    Labs reviewed: Recent Labs    06/04/21 0000 06/28/21 0000 07/24/21 1533  NA 141 139 133*  K 4.1 3.7 3.3*  CL 102 101 101  CO2 26* 29* 24  GLUCOSE  --   --  151*  BUN 11 12 9   CREATININE 0.7 0.7 0.67  CALCIUM 9.8 8.9 8.6*   Recent Labs    07/24/21 1533  AST 21  ALT 15  ALKPHOS 84  BILITOT 0.7  PROT 6.6  ALBUMIN 3.5   Recent Labs    06/04/21 0000 06/28/21 0000 07/24/21 1533  WBC 5.6 4.7 8.0  NEUTROABS  --  2.10  --   HGB 14.3 13.0 13.0  HCT 41 38  39.9  MCV  --   --  97.1  PLT 306 393 292   Lab Results  Component Value Date   TSH 0.94 04/14/2021   Lab Results  Component Value Date   HGBA1C 5.6 09/21/2017   Lab Results  Component Value Date   CHOL 191 04/14/2021   HDL 61 04/14/2021   LDLCALC 99 04/14/2021   TRIG 156 04/14/2021   CHOLHDL 2.1 09/21/2017    Significant Diagnostic Results in last 30 days:  CT Angio Head W or Wo Contrast  Result Date: 07/24/2021 CLINICAL DATA:  Unwitnessed fall at 8 o'clock a.m. today. Lethargy in dizziness beginning 1-1/2 hours ago. EXAM: CT ANGIOGRAPHY HEAD AND NECK TECHNIQUE: Multidetector CT imaging of the head and neck was performed using the standard protocol during bolus administration of intravenous  contrast. Multiplanar CT image reconstructions and MIPs were obtained to evaluate the vascular anatomy. Carotid stenosis measurements (when applicable) are obtained utilizing NASCET criteria, using the distal internal carotid diameter as the denominator. CONTRAST:  178mL OMNIPAQUE IOHEXOL 350 MG/ML SOLN COMPARISON:  MR head and CT head 12/21/2017. FINDINGS: CT HEAD FINDINGS Brain: Moderate atrophy and white matter disease demonstrates some progression from the prior study. No acute infarct, hemorrhage, or mass lesion is present. No significant extraaxial fluid collection is present. The ventricles are proportionate to the degree of atrophy. The brainstem and cerebellum are within normal limits. Vascular: Atherosclerotic changes are present within the cavernous internal carotid arteries bilaterally. No hyperdense vessel is present. Skull: Calvarium is intact. No focal lytic or blastic lesions are present. No significant extracranial soft tissue lesion is present. Sinuses: The paranasal sinuses and mastoid air cells are clear. Bilateral lens replacements are noted. Orbits: Bilateral lens replacements are noted. Globes and orbits are otherwise unremarkable. Review of the MIP images confirms the above findings CTA NECK FINDINGS Aortic arch: 3 vessel arch configuration is present. Minimal atherosclerotic changes are present in the distal aorta without aneurysm or stenosis. Right carotid system: The right common carotid artery is within normal limits. Minimal calcifications are present at the bifurcation. Mild tortuosity is present in the cervical right ICA. No significant stenosis is present. Left carotid system: Moderate proximal left common carotid artery tortuosity is present. Dense atherosclerotic calcifications are present in the proximal left ICA without a significant stenosis relative to the more distal ICA. Vertebral arteries: The right vertebral artery is the dominant vessel. Both vertebral arteries originate  from the subclavian arteries without significant stenosis. Moderate tortuosity is present on the left. There is some segmental irregularity of the left vertebral artery without a significant stenosis in the neck. Skeleton: Cervical fusion present C5-6 and C6-7. Degenerative disc disease present at C3-4 and C4-5. 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 No focal osseous lesions are present. Other neck: Soft tissues the neck are otherwise unremarkable. Salivary glands are within normal limits. Thyroid is normal. No significant adenopathy is present. No focal mucosal or submucosal lesions are present. Upper chest: Mild ground-glass attenuation is present the lung apices. No nodule or mass lesion is present. The airways are patent. Review of the MIP images confirms the above findings CTA HEAD FINDINGS Anterior circulation: The internal carotid arteries are within normal limits through the ICA termini. The A1 and M1 segments are normal. The ACA and MCA branch vessels are normal. MCA bifurcations are within normal limits. Posterior circulation: The vertebral arteries are codominant. PICA origins are visualized and normal. Vertebrobasilar junction is normal. Both posterior cerebral arteries originate from  basilar tip. PCA branch vessels are within normal limits. Venous sinuses: The dural sinuses are patent. The straight sinus deep cerebral veins are intact. Cortical veins are within normal limits. No significant vascular malformation is evident. Anatomic variants: None Review of the MIP images confirms the above findings IMPRESSION: 1. No emergent large vessel occlusion.  No acute trauma 2. No significant proximal stenosis, aneurysm, or branch vessel occlusion within the Circle of Willis. 3. Progressive atrophy and white matter disease, likely within normal limits for age. 4. No acute intracranial abnormality. 5. Dense atherosclerotic calcifications at the left carotid bifurcation and proximal left ICA  without significant stenosis relative to the more distal ICA. 6. Minimal atherosclerotic changes at the right carotid bifurcation without significant stenosis. 7. Tortuosity of the cervical internal carotid arteries bilaterally without significant stenosis. This is nonspecific, but most commonly seen in the setting of chronic hypertension. 8. Multilevel spondylosis of the cervical spine. Electronically Signed   By: San Morelle M.D.   On: 07/24/2021 18:55   CT Angio Neck W and/or Wo Contrast  Result Date: 07/24/2021 CLINICAL DATA:  Unwitnessed fall at 8 o'clock a.m. today. Lethargy in dizziness beginning 1-1/2 hours ago. EXAM: CT ANGIOGRAPHY HEAD AND NECK TECHNIQUE: Multidetector CT imaging of the head and neck was performed using the standard protocol during bolus administration of intravenous contrast. Multiplanar CT image reconstructions and MIPs were obtained to evaluate the vascular anatomy. Carotid stenosis measurements (when applicable) are obtained utilizing NASCET criteria, using the distal internal carotid diameter as the denominator. CONTRAST:  124mL OMNIPAQUE IOHEXOL 350 MG/ML SOLN COMPARISON:  MR head and CT head 12/21/2017. FINDINGS: CT HEAD FINDINGS Brain: Moderate atrophy and white matter disease demonstrates some progression from the prior study. No acute infarct, hemorrhage, or mass lesion is present. No significant extraaxial fluid collection is present. The ventricles are proportionate to the degree of atrophy. The brainstem and cerebellum are within normal limits. Vascular: Atherosclerotic changes are present within the cavernous internal carotid arteries bilaterally. No hyperdense vessel is present. Skull: Calvarium is intact. No focal lytic or blastic lesions are present. No significant extracranial soft tissue lesion is present. Sinuses: The paranasal sinuses and mastoid air cells are clear. Bilateral lens replacements are noted. Orbits: Bilateral lens replacements are noted.  Globes and orbits are otherwise unremarkable. Review of the MIP images confirms the above findings CTA NECK FINDINGS Aortic arch: 3 vessel arch configuration is present. Minimal atherosclerotic changes are present in the distal aorta without aneurysm or stenosis. Right carotid system: The right common carotid artery is within normal limits. Minimal calcifications are present at the bifurcation. Mild tortuosity is present in the cervical right ICA. No significant stenosis is present. Left carotid system: Moderate proximal left common carotid artery tortuosity is present. Dense atherosclerotic calcifications are present in the proximal left ICA without a significant stenosis relative to the more distal ICA. Vertebral arteries: The right vertebral artery is the dominant vessel. Both vertebral arteries originate from the subclavian arteries without significant stenosis. Moderate tortuosity is present on the left. There is some segmental irregularity of the left vertebral artery without a significant stenosis in the neck. Skeleton: Cervical fusion present C5-6 and C6-7. Degenerative disc disease present at C3-4 and C4-5. 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 No focal osseous lesions are present. Other neck: Soft tissues the neck are otherwise unremarkable. Salivary glands are within normal limits. Thyroid is normal. No significant adenopathy is present. No focal mucosal or submucosal lesions are present. Upper chest:  Mild ground-glass attenuation is present the lung apices. No nodule or mass lesion is present. The airways are patent. Review of the MIP images confirms the above findings CTA HEAD FINDINGS Anterior circulation: The internal carotid arteries are within normal limits through the ICA termini. The A1 and M1 segments are normal. The ACA and MCA branch vessels are normal. MCA bifurcations are within normal limits. Posterior circulation: The vertebral arteries are codominant. PICA origins are  visualized and normal. Vertebrobasilar junction is normal. Both posterior cerebral arteries originate from basilar tip. PCA branch vessels are within normal limits. Venous sinuses: The dural sinuses are patent. The straight sinus deep cerebral veins are intact. Cortical veins are within normal limits. No significant vascular malformation is evident. Anatomic variants: None Review of the MIP images confirms the above findings IMPRESSION: 1. No emergent large vessel occlusion.  No acute trauma 2. No significant proximal stenosis, aneurysm, or branch vessel occlusion within the Circle of Willis. 3. Progressive atrophy and white matter disease, likely within normal limits for age. 4. No acute intracranial abnormality. 5. Dense atherosclerotic calcifications at the left carotid bifurcation and proximal left ICA without significant stenosis relative to the more distal ICA. 6. Minimal atherosclerotic changes at the right carotid bifurcation without significant stenosis. 7. Tortuosity of the cervical internal carotid arteries bilaterally without significant stenosis. This is nonspecific, but most commonly seen in the setting of chronic hypertension. 8. Multilevel spondylosis of the cervical spine. Electronically Signed   By: San Morelle M.D.   On: 07/24/2021 18:55   CT ABDOMEN PELVIS W CONTRAST  Result Date: 07/24/2021 CLINICAL DATA:  Lethargy, dizziness, fell this morning, right flank bruising EXAM: CT ABDOMEN AND PELVIS WITH CONTRAST TECHNIQUE: Multidetector CT imaging of the abdomen and pelvis was performed using the standard protocol following bolus administration of intravenous contrast. CONTRAST:  122mL OMNIPAQUE IOHEXOL 350 MG/ML SOLN COMPARISON:  12/17/2015 FINDINGS: Lower chest: No acute pleural or parenchymal lung disease. Hepatobiliary: No focal liver abnormality is seen. No gallstones, gallbladder wall thickening, or biliary dilatation. Pancreas: Unremarkable. No pancreatic ductal dilatation or  surrounding inflammatory changes. Spleen: Normal in size without focal abnormality. Adrenals/Urinary Tract: Adrenal glands are unremarkable. Kidneys are normal, without renal calculi, focal lesion, or hydronephrosis. Bladder is unremarkable. Stomach/Bowel: No bowel obstruction or ileus. No bowel wall thickening or inflammatory change. Vascular/Lymphatic: Aortic atherosclerosis. No enlarged abdominal or pelvic lymph nodes. Reproductive: Uterus and bilateral adnexa are unremarkable. Other: No free fluid or free gas.  No abdominal wall hernia. Musculoskeletal: No acute or destructive bony lesions. Prominent spondylosis within the thoracolumbar junction and lumbosacral junction. Postsurgical changes within the lower lumbar spine. Reconstructed images demonstrate no additional findings. IMPRESSION: 1. No acute intra-abdominal or intrapelvic process. 2.  Aortic Atherosclerosis (ICD10-I70.0). Electronically Signed   By: Randa Ngo M.D.   On: 07/24/2021 18:58   DG Pelvis Portable  Result Date: 07/24/2021 CLINICAL DATA:  Fall. EXAM: PORTABLE PELVIS 1-2 VIEWS COMPARISON:  None. FINDINGS: There is no evidence of pelvic fracture or diastasis. No pelvic bone lesions are seen. IMPRESSION: Negative. Electronically Signed   By: Marijo Conception M.D.   On: 07/24/2021 15:53   CT C-SPINE NO CHARGE  Result Date: 07/24/2021 CLINICAL DATA:  Trauma history of fall EXAM: CT Cervical Spine without contrast TECHNIQUE: Multiplanar CT images of the cervical spine were reconstructed from contemporary CT of the Neck. CONTRAST:  None or No additional COMPARISON:  CT cervical spine 03/03/2016 FINDINGS: Alignment: No subluxation.  Facet alignment within normal limits. Skull base  and vertebrae: No acute fracture. No primary bone lesion or focal pathologic process. Soft tissues and spinal canal: No prevertebral fluid or swelling. No visible canal hematoma. Disc levels: Anterior fusion C5 through C7 with anterior plate, fixating screws  and interbody devices. Moderate degenerative changes at C3-C4 and C4-C5. Facet degenerative changes at multiple levels. Upper chest: Negative. Other: None IMPRESSION: Post fusion changes C5 through C7.  No acute osseous abnormality Electronically Signed   By: Donavan Foil M.D.   On: 07/24/2021 19:01   DG Chest Portable 1 View  Result Date: 07/24/2021 CLINICAL DATA:  Fall. EXAM: PORTABLE CHEST 1 VIEW COMPARISON:  September 20, 2017. FINDINGS: The heart size and mediastinal contours are within normal limits. Both lungs are clear. The visualized skeletal structures are unremarkable. IMPRESSION: No active disease. Electronically Signed   By: Marijo Conception M.D.   On: 07/24/2021 15:51    Assessment/Plan  1. Dehydration Elevated specific gravity and ketones in urine in the ED, NA 133 K 3.3 Moist mucus membranes and bp ok Encourage water and gatorade x 48 hrs Repeat BMP 11/3 Get orthostatic bp/pulse  2. Weakness Due to decreased intake s/p covid   3. Spinal stenosis of lumbar region with neurogenic claudication S/p laminectomy Denies pain, will reduce neurontin to see if this helps with weakness/lethargy Has not used clonazepam since 10/26 per nurse    Family/ staff Communication: left message with son Jaynee Eagles ordered:  [name] 11/3

## 2021-07-29 DIAGNOSIS — M6281 Muscle weakness (generalized): Secondary | ICD-10-CM | POA: Diagnosis not present

## 2021-07-29 DIAGNOSIS — R531 Weakness: Secondary | ICD-10-CM | POA: Diagnosis not present

## 2021-07-29 DIAGNOSIS — R42 Dizziness and giddiness: Secondary | ICD-10-CM | POA: Diagnosis not present

## 2021-07-29 DIAGNOSIS — M48062 Spinal stenosis, lumbar region with neurogenic claudication: Secondary | ICD-10-CM | POA: Diagnosis not present

## 2021-07-29 DIAGNOSIS — R2689 Other abnormalities of gait and mobility: Secondary | ICD-10-CM | POA: Diagnosis not present

## 2021-07-29 DIAGNOSIS — Z4789 Encounter for other orthopedic aftercare: Secondary | ICD-10-CM | POA: Diagnosis not present

## 2021-07-29 DIAGNOSIS — M62562 Muscle wasting and atrophy, not elsewhere classified, left lower leg: Secondary | ICD-10-CM | POA: Diagnosis not present

## 2021-07-29 DIAGNOSIS — M62561 Muscle wasting and atrophy, not elsewhere classified, right lower leg: Secondary | ICD-10-CM | POA: Diagnosis not present

## 2021-07-29 DIAGNOSIS — M5459 Other low back pain: Secondary | ICD-10-CM | POA: Diagnosis not present

## 2021-07-29 DIAGNOSIS — R278 Other lack of coordination: Secondary | ICD-10-CM | POA: Diagnosis not present

## 2021-07-29 DIAGNOSIS — Z8673 Personal history of transient ischemic attack (TIA), and cerebral infarction without residual deficits: Secondary | ICD-10-CM | POA: Diagnosis not present

## 2021-07-29 DIAGNOSIS — R41841 Cognitive communication deficit: Secondary | ICD-10-CM | POA: Diagnosis not present

## 2021-07-29 DIAGNOSIS — F0150 Vascular dementia without behavioral disturbance: Secondary | ICD-10-CM | POA: Diagnosis not present

## 2021-07-30 DIAGNOSIS — R531 Weakness: Secondary | ICD-10-CM | POA: Diagnosis not present

## 2021-07-30 LAB — BASIC METABOLIC PANEL
BUN: 8 (ref 4–21)
CO2: 23 — AB (ref 13–22)
Chloride: 110 — AB (ref 99–108)
Creatinine: 0.6 (ref 0.5–1.1)
Glucose: 100
Potassium: 3.8 (ref 3.4–5.3)
Sodium: 148 — AB (ref 137–147)

## 2021-07-30 LAB — COMPREHENSIVE METABOLIC PANEL
Calcium: 9.1 (ref 8.7–10.7)
GFR calc Af Amer: >90
GFR calc non Af Amer: 84.8

## 2021-07-31 DIAGNOSIS — M5459 Other low back pain: Secondary | ICD-10-CM | POA: Diagnosis not present

## 2021-07-31 DIAGNOSIS — M48062 Spinal stenosis, lumbar region with neurogenic claudication: Secondary | ICD-10-CM | POA: Diagnosis not present

## 2021-07-31 DIAGNOSIS — Z4789 Encounter for other orthopedic aftercare: Secondary | ICD-10-CM | POA: Diagnosis not present

## 2021-07-31 DIAGNOSIS — F0150 Vascular dementia without behavioral disturbance: Secondary | ICD-10-CM | POA: Diagnosis not present

## 2021-07-31 DIAGNOSIS — M6281 Muscle weakness (generalized): Secondary | ICD-10-CM | POA: Diagnosis not present

## 2021-07-31 LAB — BASIC METABOLIC PANEL
BUN: 7 (ref 4–21)
CO2: 24 — AB (ref 13–22)
Chloride: 107 (ref 99–108)
Creatinine: 0.7 (ref 0.5–1.1)
Glucose: 100
Potassium: 4.1 (ref 3.4–5.3)
Sodium: 148 — AB (ref 137–147)

## 2021-07-31 LAB — COMPREHENSIVE METABOLIC PANEL
Calcium: 9.5 (ref 8.7–10.7)
GFR calc Af Amer: >90
GFR calc non Af Amer: 82.63

## 2021-08-03 DIAGNOSIS — Z4789 Encounter for other orthopedic aftercare: Secondary | ICD-10-CM | POA: Diagnosis not present

## 2021-08-03 DIAGNOSIS — M6281 Muscle weakness (generalized): Secondary | ICD-10-CM | POA: Diagnosis not present

## 2021-08-03 DIAGNOSIS — M48062 Spinal stenosis, lumbar region with neurogenic claudication: Secondary | ICD-10-CM | POA: Diagnosis not present

## 2021-08-03 DIAGNOSIS — M5459 Other low back pain: Secondary | ICD-10-CM | POA: Diagnosis not present

## 2021-08-03 DIAGNOSIS — F0150 Vascular dementia without behavioral disturbance: Secondary | ICD-10-CM | POA: Diagnosis not present

## 2021-08-05 DIAGNOSIS — Z4789 Encounter for other orthopedic aftercare: Secondary | ICD-10-CM | POA: Diagnosis not present

## 2021-08-05 DIAGNOSIS — F0150 Vascular dementia without behavioral disturbance: Secondary | ICD-10-CM | POA: Diagnosis not present

## 2021-08-05 DIAGNOSIS — M62561 Muscle wasting and atrophy, not elsewhere classified, right lower leg: Secondary | ICD-10-CM | POA: Diagnosis not present

## 2021-08-05 DIAGNOSIS — R278 Other lack of coordination: Secondary | ICD-10-CM | POA: Diagnosis not present

## 2021-08-05 DIAGNOSIS — M62562 Muscle wasting and atrophy, not elsewhere classified, left lower leg: Secondary | ICD-10-CM | POA: Diagnosis not present

## 2021-08-05 DIAGNOSIS — M5459 Other low back pain: Secondary | ICD-10-CM | POA: Diagnosis not present

## 2021-08-05 DIAGNOSIS — R2689 Other abnormalities of gait and mobility: Secondary | ICD-10-CM | POA: Diagnosis not present

## 2021-08-05 DIAGNOSIS — M6281 Muscle weakness (generalized): Secondary | ICD-10-CM | POA: Diagnosis not present

## 2021-08-05 DIAGNOSIS — M48062 Spinal stenosis, lumbar region with neurogenic claudication: Secondary | ICD-10-CM | POA: Diagnosis not present

## 2021-08-06 DIAGNOSIS — Z8673 Personal history of transient ischemic attack (TIA), and cerebral infarction without residual deficits: Secondary | ICD-10-CM | POA: Diagnosis not present

## 2021-08-06 DIAGNOSIS — F0150 Vascular dementia without behavioral disturbance: Secondary | ICD-10-CM | POA: Diagnosis not present

## 2021-08-06 DIAGNOSIS — E871 Hypo-osmolality and hyponatremia: Secondary | ICD-10-CM | POA: Diagnosis not present

## 2021-08-06 DIAGNOSIS — R42 Dizziness and giddiness: Secondary | ICD-10-CM | POA: Diagnosis not present

## 2021-08-06 DIAGNOSIS — R41841 Cognitive communication deficit: Secondary | ICD-10-CM | POA: Diagnosis not present

## 2021-08-06 LAB — COMPREHENSIVE METABOLIC PANEL
Calcium: 9.1 (ref 8.7–10.7)
GFR calc Af Amer: >90
GFR calc non Af Amer: 83.03

## 2021-08-06 LAB — BASIC METABOLIC PANEL
BUN: 11 (ref 4–21)
CO2: 26 — AB (ref 13–22)
Chloride: 107 (ref 99–108)
Creatinine: 0.7 (ref 0.5–1.1)
Glucose: 104
Potassium: 4.2 (ref 3.4–5.3)
Sodium: 143 (ref 137–147)

## 2021-08-07 DIAGNOSIS — R278 Other lack of coordination: Secondary | ICD-10-CM | POA: Diagnosis not present

## 2021-08-07 DIAGNOSIS — M62562 Muscle wasting and atrophy, not elsewhere classified, left lower leg: Secondary | ICD-10-CM | POA: Diagnosis not present

## 2021-08-07 DIAGNOSIS — F0150 Vascular dementia without behavioral disturbance: Secondary | ICD-10-CM | POA: Diagnosis not present

## 2021-08-07 DIAGNOSIS — Z4789 Encounter for other orthopedic aftercare: Secondary | ICD-10-CM | POA: Diagnosis not present

## 2021-08-07 DIAGNOSIS — M5459 Other low back pain: Secondary | ICD-10-CM | POA: Diagnosis not present

## 2021-08-07 DIAGNOSIS — M48062 Spinal stenosis, lumbar region with neurogenic claudication: Secondary | ICD-10-CM | POA: Diagnosis not present

## 2021-08-07 DIAGNOSIS — R2689 Other abnormalities of gait and mobility: Secondary | ICD-10-CM | POA: Diagnosis not present

## 2021-08-07 DIAGNOSIS — M62561 Muscle wasting and atrophy, not elsewhere classified, right lower leg: Secondary | ICD-10-CM | POA: Diagnosis not present

## 2021-08-07 DIAGNOSIS — M6281 Muscle weakness (generalized): Secondary | ICD-10-CM | POA: Diagnosis not present

## 2021-08-10 DIAGNOSIS — Z4789 Encounter for other orthopedic aftercare: Secondary | ICD-10-CM | POA: Diagnosis not present

## 2021-08-10 DIAGNOSIS — F0150 Vascular dementia without behavioral disturbance: Secondary | ICD-10-CM | POA: Diagnosis not present

## 2021-08-10 DIAGNOSIS — M5459 Other low back pain: Secondary | ICD-10-CM | POA: Diagnosis not present

## 2021-08-10 DIAGNOSIS — M48062 Spinal stenosis, lumbar region with neurogenic claudication: Secondary | ICD-10-CM | POA: Diagnosis not present

## 2021-08-10 DIAGNOSIS — M6281 Muscle weakness (generalized): Secondary | ICD-10-CM | POA: Diagnosis not present

## 2021-08-12 DIAGNOSIS — Z4789 Encounter for other orthopedic aftercare: Secondary | ICD-10-CM | POA: Diagnosis not present

## 2021-08-12 DIAGNOSIS — M5459 Other low back pain: Secondary | ICD-10-CM | POA: Diagnosis not present

## 2021-08-12 DIAGNOSIS — R41841 Cognitive communication deficit: Secondary | ICD-10-CM | POA: Diagnosis not present

## 2021-08-12 DIAGNOSIS — M6281 Muscle weakness (generalized): Secondary | ICD-10-CM | POA: Diagnosis not present

## 2021-08-12 DIAGNOSIS — M48062 Spinal stenosis, lumbar region with neurogenic claudication: Secondary | ICD-10-CM | POA: Diagnosis not present

## 2021-08-12 DIAGNOSIS — Z8673 Personal history of transient ischemic attack (TIA), and cerebral infarction without residual deficits: Secondary | ICD-10-CM | POA: Diagnosis not present

## 2021-08-12 DIAGNOSIS — R42 Dizziness and giddiness: Secondary | ICD-10-CM | POA: Diagnosis not present

## 2021-08-12 DIAGNOSIS — F0150 Vascular dementia without behavioral disturbance: Secondary | ICD-10-CM | POA: Diagnosis not present

## 2021-08-13 DIAGNOSIS — F0150 Vascular dementia without behavioral disturbance: Secondary | ICD-10-CM | POA: Diagnosis not present

## 2021-08-13 DIAGNOSIS — M48062 Spinal stenosis, lumbar region with neurogenic claudication: Secondary | ICD-10-CM | POA: Diagnosis not present

## 2021-08-13 DIAGNOSIS — M6281 Muscle weakness (generalized): Secondary | ICD-10-CM | POA: Diagnosis not present

## 2021-08-13 DIAGNOSIS — M5459 Other low back pain: Secondary | ICD-10-CM | POA: Diagnosis not present

## 2021-08-13 DIAGNOSIS — Z4789 Encounter for other orthopedic aftercare: Secondary | ICD-10-CM | POA: Diagnosis not present

## 2021-08-17 DIAGNOSIS — F0150 Vascular dementia without behavioral disturbance: Secondary | ICD-10-CM | POA: Diagnosis not present

## 2021-08-17 DIAGNOSIS — M5459 Other low back pain: Secondary | ICD-10-CM | POA: Diagnosis not present

## 2021-08-17 DIAGNOSIS — M6281 Muscle weakness (generalized): Secondary | ICD-10-CM | POA: Diagnosis not present

## 2021-08-17 DIAGNOSIS — M48062 Spinal stenosis, lumbar region with neurogenic claudication: Secondary | ICD-10-CM | POA: Diagnosis not present

## 2021-08-17 DIAGNOSIS — Z4789 Encounter for other orthopedic aftercare: Secondary | ICD-10-CM | POA: Diagnosis not present

## 2021-08-19 DIAGNOSIS — Z4789 Encounter for other orthopedic aftercare: Secondary | ICD-10-CM | POA: Diagnosis not present

## 2021-08-19 DIAGNOSIS — M48062 Spinal stenosis, lumbar region with neurogenic claudication: Secondary | ICD-10-CM | POA: Diagnosis not present

## 2021-08-19 DIAGNOSIS — M5459 Other low back pain: Secondary | ICD-10-CM | POA: Diagnosis not present

## 2021-08-19 DIAGNOSIS — F0150 Vascular dementia without behavioral disturbance: Secondary | ICD-10-CM | POA: Diagnosis not present

## 2021-08-19 DIAGNOSIS — M6281 Muscle weakness (generalized): Secondary | ICD-10-CM | POA: Diagnosis not present

## 2021-08-21 DIAGNOSIS — M5459 Other low back pain: Secondary | ICD-10-CM | POA: Diagnosis not present

## 2021-08-21 DIAGNOSIS — M6281 Muscle weakness (generalized): Secondary | ICD-10-CM | POA: Diagnosis not present

## 2021-08-21 DIAGNOSIS — M48062 Spinal stenosis, lumbar region with neurogenic claudication: Secondary | ICD-10-CM | POA: Diagnosis not present

## 2021-08-21 DIAGNOSIS — Z4789 Encounter for other orthopedic aftercare: Secondary | ICD-10-CM | POA: Diagnosis not present

## 2021-08-21 DIAGNOSIS — F0150 Vascular dementia without behavioral disturbance: Secondary | ICD-10-CM | POA: Diagnosis not present

## 2021-09-07 DIAGNOSIS — J111 Influenza due to unidentified influenza virus with other respiratory manifestations: Secondary | ICD-10-CM | POA: Diagnosis not present

## 2021-09-10 DIAGNOSIS — M5416 Radiculopathy, lumbar region: Secondary | ICD-10-CM | POA: Diagnosis not present

## 2021-09-10 DIAGNOSIS — R030 Elevated blood-pressure reading, without diagnosis of hypertension: Secondary | ICD-10-CM | POA: Diagnosis not present

## 2021-09-10 DIAGNOSIS — Z6826 Body mass index (BMI) 26.0-26.9, adult: Secondary | ICD-10-CM | POA: Diagnosis not present

## 2021-09-15 DIAGNOSIS — I679 Cerebrovascular disease, unspecified: Secondary | ICD-10-CM | POA: Diagnosis not present

## 2021-09-15 LAB — BASIC METABOLIC PANEL
BUN: 11 (ref 4–21)
CO2: 27 — AB (ref 13–22)
Chloride: 106 (ref 99–108)
Creatinine: 0.7 (ref 0.5–1.1)
Glucose: 94
Potassium: 4.1 (ref 3.4–5.3)
Sodium: 143 (ref 137–147)

## 2021-09-15 LAB — COMPREHENSIVE METABOLIC PANEL: Calcium: 9.3 (ref 8.7–10.7)

## 2021-09-16 ENCOUNTER — Other Ambulatory Visit: Payer: Self-pay

## 2021-09-16 ENCOUNTER — Encounter: Payer: Self-pay | Admitting: Internal Medicine

## 2021-09-16 ENCOUNTER — Non-Acute Institutional Stay: Payer: Medicare Other | Admitting: Internal Medicine

## 2021-09-16 VITALS — BP 138/96 | HR 57 | Temp 98.1°F | Ht 63.0 in | Wt 140.2 lb

## 2021-09-16 DIAGNOSIS — R109 Unspecified abdominal pain: Secondary | ICD-10-CM | POA: Diagnosis not present

## 2021-09-16 DIAGNOSIS — H9313 Tinnitus, bilateral: Secondary | ICD-10-CM

## 2021-09-16 DIAGNOSIS — M5442 Lumbago with sciatica, left side: Secondary | ICD-10-CM

## 2021-09-16 DIAGNOSIS — M5441 Lumbago with sciatica, right side: Secondary | ICD-10-CM

## 2021-09-16 DIAGNOSIS — I10 Essential (primary) hypertension: Secondary | ICD-10-CM | POA: Diagnosis not present

## 2021-09-16 DIAGNOSIS — E782 Mixed hyperlipidemia: Secondary | ICD-10-CM | POA: Diagnosis not present

## 2021-09-16 DIAGNOSIS — E039 Hypothyroidism, unspecified: Secondary | ICD-10-CM | POA: Diagnosis not present

## 2021-09-16 DIAGNOSIS — F419 Anxiety disorder, unspecified: Secondary | ICD-10-CM | POA: Diagnosis not present

## 2021-09-16 DIAGNOSIS — M25511 Pain in right shoulder: Secondary | ICD-10-CM | POA: Diagnosis not present

## 2021-09-16 DIAGNOSIS — N3946 Mixed incontinence: Secondary | ICD-10-CM

## 2021-09-16 DIAGNOSIS — F0150 Vascular dementia without behavioral disturbance: Secondary | ICD-10-CM

## 2021-09-16 DIAGNOSIS — Z9889 Other specified postprocedural states: Secondary | ICD-10-CM | POA: Diagnosis not present

## 2021-09-16 DIAGNOSIS — G8929 Other chronic pain: Secondary | ICD-10-CM

## 2021-09-16 MED ORDER — MIRABEGRON ER 25 MG PO TB24: 25.0000 mg | ORAL_TABLET | Freq: Every day | ORAL | 0 refills | Status: DC

## 2021-09-16 NOTE — Progress Notes (Signed)
Bianca Garcia  Location: [place] of Service:  Clinic (12)  Provider:   Code Status:  Goals of Care:  Advanced Directives 07/24/2021  Does Patient Have a Medical Advance Directive? Yes  Type of Advance Directive Out of facility DNR (pink MOST or yellow form)  Does patient want to make changes to medical advance directive? Yes (ED - Information included in AVS)  Copy of [place] in Chart? -  Would patient like information on creating a medical advance directive? -  Pre-existing out of facility DNR order (yellow form or pink MOST form) -     Chief Complaint  Patient presents with   Medical Management of Chronic Issues    Patient returns to the clinic for her 4 month follow up. Patient would like to discuss her back healing process.     HPI: Patient is a 80 y.o. female seen today for an visit for Follow up and Multiple issues  has h/o  Vascular dementia S/p CVA in 2018 and Repeat MRI has shown Chronic Vessel Changes Essential Tremor, Anxiety, Brachial Plexux Palsy, Chronic Pain in her Left Knee, Hypothyroidism, Hyperlipidemia,  S/P L4-5, L5-S1 decompressive laminectomy with foraminotomies due to Severe Back pain Done on 09/12  Number of Complains today Low Back pain  Patient has a history of chronic back pain underwent above stated procedure.  But she continues to complain of pain.  Her pain is more in upper lumbar area bilateral with radiating down to both her thighs.  She states she is sleeping better but pain mostly bother her during the daytime. She was on high-dose of gabapentin which was decreased due to her getting lethargic. Continues to follow with Dr. Trenton Gammon who has said it will take few months for her to get better She is walking with her walker Right shoulder pain Seems to be a new problem.  Patient states that she fell couple weeks ago and has been hurting in her right shoulder since then Complaining of lower abdominal  discomfort Not sure if her bowels are moving well.  No nausea vomiting eating well Urinary incontinence That is per nurses she has been going to the bathroom many times and having stress incontinence with accidents Patient has become more sedentary she states it is due to pain. 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 Street of back injections by Dr. Brien Few 2016 with good relief, history of cervical DD with possiblity of surgery by Dr. Trenton Gammon.   GERD (gastroesophageal reflux disease)    IBS with moderate refluc seen on upper GI study form January 2011 Dr. Watt Climes   HTN (hypertension)    no meds   Hyperlipidemia    Hypothyroidism    IBS (irritable bowel syndrome)    Imbalance    Impingement syndrome of right shoulder 01/05/2013   Mass of left side of neck    Memory loss    Mood disorder (HCC)    MVP (mitral valve prolapse)  Hx of    Osteopenia    Peripheral edema    Right rotator cuff tear 01/05/2013   Sciatic pain    recurrent   Seasonal allergies    Stroke [facility])    Tension headache    TIA (transient ischemic attack)    Tremor     Past Surgical History:  Procedure Laterality Date   ANTERIOR CERVICAL DECOMP/DISCECTOMY FUSION  04/2015    Dr. Pamala Hurry, [place]  2004   which revealed smooth and normal coronary arteries   CARPAL TUNNEL RELEASE  2000   lt   CATARACT EXTRACTION W/ INTRAOCULAR LENS  IMPLANT, BILATERAL  1/17   Dr. Lucita Ferrara   COSMETIC SURGERY  2010   eyes-facial   DENTAL  SURGERY  1960   LASIK     LOOP RECORDER INSERTION N/A 09/22/2017   Procedure: LOOP RECORDER INSERTION;  Surgeon: Constance Haw, MD;  Location: [place] [facility];  Service: Cardiovascular;  Laterality: N/A;   LUMBAR LAMINECTOMY/DECOMPRESSION MICRODISCECTOMY N/A 06/08/2021   Procedure: Laminectomy and Foraminotomy - L4-L5, L5-S1;  Surgeon: Earnie Larsson, MD;  Location: [place];  Service: Neurosurgery;  Laterality: N/A;   SHOULDER ARTHROSCOPY WITH ROTATOR CUFF REPAIR Right 01/05/2013   Procedure: SHOULDER ARTHROSCOPY WITH ARTHROSCOPIC  ROTATOR CUFF REPAIR, ACROMIOPLASTY, EXTENSIVE DEBRIDEMENT;  Surgeon: Johnny Bridge, MD;  Location: [place];  Service: Orthopedics;  Laterality: Right;   TEE WITHOUT CARDIOVERSION N/A 09/22/2017   Procedure: TRANSESOPHAGEAL ECHOCARDIOGRAM (TEE);  Surgeon: Sanda Klein, MD;  Location: [facility];  Service: Cardiovascular;  Laterality: N/A;   TONSILLECTOMY     TUBAL LIGATION      Allergies  Allergen Reactions   Fluticasone Other (See Comments)    DRY EYES   Aciphex [Rabeprazole]     Critical    Codeine Nausea Only   Codeine    Cymbalta [Duloxetine Hcl]     Critical    Lexapro [Escitalopram]     critical   Lipitor [Atorvastatin Calcium]     Intolerant    Pneumovax [Pneumococcal Polysaccharide Vaccine]     moderate   Prevacid [Lansoprazole]     Unknown, listed on MAR   Rosuvastatin     Intolerant    Sertraline     critical   Statins     Critical    Sulfa Antibiotics     vomiting   Tetracyclines & Related     Morphine Sulfate   Welchol [Colesevelam]     Unknown, listed on MAR   Pneumovax 23 [Pneumococcal Vac Polyvalent] Rash    Injection site reaction    Outpatient Encounter Medications as of 09/16/2021  Medication Sig   acetaminophen (TYLENOL) 325 MG tablet Take 650 mg by mouth See admin instructions. Take 650 mg 3 times daily, may take 650 mg 2 times daily as needed for pain   acetaminophen (TYLENOL) 325 MG  tablet Take 650 mg by mouth as directed. Take 2 tablets (650 mg) TID & Take 2 tablets (650 mg) BID PRN PAIN   aspirin EC 81 MG tablet Take 81 mg by mouth daily. Swallow whole.   Calcium Carb-Cholecalciferol (CALCIUM 500 + D PO) Take 1 tablet by mouth daily.   calcium carbonate (TUMS - DOSED IN MG ELEMENTAL CALCIUM) 500 MG chewable tablet Chew 2 tablets by mouth 2 (two) times daily as needed for indigestion or heartburn.   clonazePAM (KLONOPIN) 0.5 MG tablet Take 0.25 mg by mouth 2 (two) times daily as needed (for anxiety and  shakiness).   clopidogrel (PLAVIX) 75 MG tablet Take 1 tablet (75 mg total) by mouth daily.   donepezil (ARICEPT) 10 MG tablet Take 10 mg by mouth daily.   gabapentin (NEURONTIN) 100 MG capsule Take 200 mg by mouth 2 (two) times daily.   levothyroxine (SYNTHROID) 88 MCG tablet Take 88 mcg by mouth daily before breakfast.   melatonin 5 MG TABS Take 5 mg by mouth at bedtime as needed (sleep).   Multiple Vitamins-Minerals (PRESERVISION AREDS PO) Take 1 tablet by mouth 2 (two) times daily.    omeprazole (PRILOSEC) 40 MG capsule Take 40 mg by mouth daily.   propranolol (INDERAL) 40 MG tablet Take 40 mg by mouth 2 (two) times daily.   rosuvastatin (CRESTOR) 5 MG tablet Take 1 tablet (5 mg total) by mouth daily.   [DISCONTINUED] guaiFENesin (ROBITUSSIN) 100 MG/5ML liquid Take 200 mg by mouth every 6 (six) hours as needed for cough.   No facility-administered encounter medications on file as of 09/16/2021.    Review of Systems:  Review of Systems  Constitutional:  Positive for activity change. Negative for appetite change.  HENT:  Positive for tinnitus.   Respiratory:  Negative for cough and shortness of breath.   Cardiovascular:  Negative for leg swelling.  Gastrointestinal:  Positive for abdominal distention.  Genitourinary:  Positive for frequency and urgency.  Musculoskeletal:  Positive for arthralgias, back pain and myalgias. Negative for gait problem.  Skin: Negative.    Neurological:  Negative for dizziness and weakness.  Psychiatric/Behavioral:  Positive for confusion. Negative for dysphoric mood and sleep disturbance.    Health Maintenance  Topic Date Due   Zoster Vaccines- Shingrix (2 of 2) 09/17/2021   COVID-19 Vaccine (3 - Booster for Janssen series) 09/30/2021   TETANUS/TDAP  08/18/2022   Pneumonia Vaccine 60+ Years old  Completed   INFLUENZA VACCINE  Completed   DEXA SCAN  Completed   HPV VACCINES  Aged Out    Physical Exam: Vitals:   09/16/21 0905  BP: (!) 138/96  Pulse: (!) 57  Temp: 98.1 F (36.7 C)  SpO2: 98%  Weight: 140 lb 3.2 oz (63.6 kg)  Height: 5\' 3"  (1.6 m)   Body mass index is 24.84 kg/m. Physical Exam Vitals reviewed.  Constitutional:      Appearance: Normal appearance.  HENT:     Head: Normocephalic.     Ears:     Comments: Some Wax in Both ears    Nose: Nose normal.     Mouth/Throat:     Mouth: Mucous membranes are moist.     Pharynx: Oropharynx is clear.  Eyes:     Pupils: Pupils are equal, round, and reactive to light.  Cardiovascular:     Rate and Rhythm: Normal rate and regular rhythm.     Pulses: Normal pulses.     Heart sounds: Normal heart sounds. No murmur heard. Pulmonary:     Effort: Pulmonary effort is normal.     Breath sounds: Normal breath sounds.  Abdominal:     General: Abdomen is flat. Bowel sounds are normal.     Palpations: Abdomen is soft.     Comments: ? Lower Abdominal Discomfort  Musculoskeletal:        General: No swelling.     Cervical back: Neck supple.     Comments: Right shoulder had crepitus  Was able to Move it with not significant pain  Skin:    General: Skin is warm.  Neurological:  General: No focal deficit present.     Mental Status: She is alert.     Comments: Walks well with her walker  Psychiatric:        Mood and Affect: Mood normal.        Thought Content: Thought content normal.    Labs reviewed: Basic Metabolic Panel: Recent Labs     02/09/21 0000 04/14/21 0000 06/04/21 0000 07/24/21 1533 07/30/21 0000 07/31/21 0000 08/06/21 0000 09/15/21 0000  NA 143  --    < > 133*   < > 148* 143 143  K 4.3  --    < > 3.3*   < > 4.1 4.2 4.1  CL 105  --    < > 101   < > 107 107 106  CO2 23*  --    < > 24   < > 24* 26* 27*  GLUCOSE  --   --   --  151*  --   --   --   --   BUN 10  --    < > 9   < > 7 11 11   CREATININE 0.8  --    < > 0.67   < > 0.7 0.7 0.7  CALCIUM 9.4  --    < > 8.6*   < > 9.5 9.1 9.3  TSH 0.13* 0.94  --   --   --   --   --   --    < > = values in this interval not displayed.   Liver Function Tests: Recent Labs    07/24/21 1533  AST 21  ALT 15  ALKPHOS 84  BILITOT 0.7  PROT 6.6  ALBUMIN 3.5   No results for input(s): LIPASE, AMYLASE in the last 8760 hours. No results for input(s): AMMONIA in the last 8760 hours. CBC: Recent Labs    06/04/21 0000 06/28/21 0000 07/24/21 1533  WBC 5.6 4.7 8.0  NEUTROABS  --  2.10  --   HGB 14.3 13.0 13.0  HCT 41 38 39.9  MCV  --   --  97.1  PLT 306 393 292   Lipid Panel: Recent Labs    04/14/21 0000  CHOL 191  HDL 61  LDLCALC 99  TRIG 156   Lab Results  Component Value Date   HGBA1C 5.6 09/21/2017    Procedures since last visit: No results found.  Assessment/Plan 1. Chronic bilateral low back pain with bilateral sciatica Add Neurontin 100 mg in afternoon Higher dose cause Lethargy Voltaren Gel BID and Prn Continue to walk with her walker  2. S/P lumbar laminectomy Follows with Dr Annette Stable  3. Right shoulder pain, unspecified chronicity Xray and Voltaren gel PRn  4. Tinnitus of both ears Ear wash ordered  5. Abdominal discomfort ? D/W the Nurse about Constipation They will let me know Already on Prilosec  6. Acquired hypothyroidism TSH normal in 7/22  7. Mixed hyperlipidemia On Statin  8. Essential hypertension Controlled on inderal  9. Vascular dementia without behavioral disturbance ([place]) She is on Aricept Continues to get  worse. And Needs More Help  10. Anxiety Continue Klonopin PRn 11 Urinary Incontinence Will try Myrbetriq to see if it helps with her Frequency 12.H/o CVA in 2018 Continue Plavix and statin 13.Essential tremor Stable on Inderal   Labs/tests ordered:  * No order type specified * Next appt:  11/23/2021

## 2021-09-17 DIAGNOSIS — M25511 Pain in right shoulder: Secondary | ICD-10-CM | POA: Diagnosis not present

## 2021-11-23 ENCOUNTER — Non-Acute Institutional Stay: Payer: Medicare Other | Admitting: Adult Health

## 2021-11-23 ENCOUNTER — Encounter: Payer: Self-pay | Admitting: Adult Health

## 2021-11-23 ENCOUNTER — Other Ambulatory Visit: Payer: Self-pay

## 2021-11-23 DIAGNOSIS — E039 Hypothyroidism, unspecified: Secondary | ICD-10-CM | POA: Diagnosis not present

## 2021-11-23 DIAGNOSIS — N3281 Overactive bladder: Secondary | ICD-10-CM | POA: Diagnosis not present

## 2021-11-23 DIAGNOSIS — F0150 Vascular dementia without behavioral disturbance: Secondary | ICD-10-CM

## 2021-11-23 DIAGNOSIS — I10 Essential (primary) hypertension: Secondary | ICD-10-CM | POA: Diagnosis not present

## 2021-11-23 DIAGNOSIS — M48062 Spinal stenosis, lumbar region with neurogenic claudication: Secondary | ICD-10-CM

## 2021-11-23 DIAGNOSIS — E782 Mixed hyperlipidemia: Secondary | ICD-10-CM | POA: Diagnosis not present

## 2021-11-23 DIAGNOSIS — R251 Tremor, unspecified: Secondary | ICD-10-CM | POA: Diagnosis not present

## 2021-11-23 NOTE — Assessment & Plan Note (Signed)
Progressive memory loss is noted c/w the disease. No behaviors. Remains AL appropriate. Continue Aricept.

## 2021-11-23 NOTE — Assessment & Plan Note (Signed)
S/p laminectomy Did have improvement in pain and functional status but does still have some burning pain. Would not taper neurontin at this time. Continue walking with the walker.

## 2021-11-23 NOTE — Assessment & Plan Note (Signed)
Lab Results  Component Value Date   TSH 0.94 04/14/2021   Continue Synthroid.

## 2021-11-23 NOTE — Assessment & Plan Note (Signed)
Lab Results  Component Value Date   LDLCALC 99 04/14/2021   Continue crestor.

## 2021-11-23 NOTE — Assessment & Plan Note (Signed)
Controlled with propranolol

## 2021-11-23 NOTE — Assessment & Plan Note (Signed)
Controlled  Goal <150/90

## 2021-11-23 NOTE — Assessment & Plan Note (Signed)
Improved symptoms of frequency and incontinence.  Continue Myrbetriq.

## 2021-11-23 NOTE — Progress Notes (Signed)
Location: [facility]  POS: Clinic  Provider:  Cindi Carbon, [place] [phone]   Code Status: DNR Goals of Care:  Advanced Directives 07/24/2021  Does Patient Have a Medical Advance Directive? Yes  Type of Advance Directive Out of facility DNR (pink MOST or yellow form)  Does patient want to make changes to medical advance directive? Yes (ED - Information included in AVS)  Copy of [place] in Chart? -  Would patient like information on creating a medical advance directive? -  Pre-existing out of facility DNR order (yellow form or pink MOST form) -     Chief Complaint  Patient presents with   Medical Management of Chronic Issues    Follow up on Pain.    HPI: Patient is a 81 y.o. female seen today for medical management of chronic diseases.   PMH significat for CVA, dementia, spinal stenosis, hypothyroidism, tremor on propranolol, gerd, anxiety, stroke, HTN, HLD, arthritis.  Had lumbar laminectomy for low back pain with sciatica 06/08/21. The nurse reports her pain has improved. She is now walking more and caring for dog. She does still have some pain described as burning,  on tylenol and gabapentin.  MMSE 21/30 07/07/21 on aricept. Needs cuing and reminders. Very short term memory loss.  Started on myrbetriq for incontinence 09/16/21.   Continues on prilosec for gerd. The nurse reports she periodically needs tums for indigestion. 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 Street of back injections by Dr. Brien Few 2016 with good relief, history of cervical DD with possiblity of surgery by Dr. Trenton Gammon.   GERD (gastroesophageal  reflux disease)    IBS with moderate refluc seen on upper GI study form January 2011 Dr. Watt Climes   HTN (hypertension)    no meds   Hyperlipidemia    Hypothyroidism    IBS (irritable bowel syndrome)    Imbalance    Impingement syndrome of right shoulder 01/05/2013   Mass of left side of neck    Memory loss    Mood disorder ([place])    MVP (mitral valve prolapse)    Hx of    Osteopenia    Peripheral edema    Right rotator cuff tear 01/05/2013   Sciatic pain    recurrent   Seasonal allergies    Stroke [facility])    Tension headache    TIA (transient ischemic attack)    Tremor     Past Surgical History:  Procedure Laterality Date   ANTERIOR CERVICAL DECOMP/DISCECTOMY FUSION  04/2015    Dr. Pamala Hurry, [place]  2004   which revealed smooth and normal coronary arteries   CARPAL TUNNEL RELEASE  2000   lt   CATARACT EXTRACTION W/ INTRAOCULAR LENS  IMPLANT, BILATERAL  1/17   Dr. Lucita Ferrara   COSMETIC SURGERY  2010   eyes-facial   DENTAL SURGERY  1960   LASIK     LOOP RECORDER INSERTION N/A 09/22/2017   Procedure: LOOP RECORDER INSERTION;  Surgeon: Constance Haw, MD;  Location: [place] [facility];  Service: Cardiovascular;  Laterality: N/A;  LUMBAR LAMINECTOMY/DECOMPRESSION MICRODISCECTOMY N/A 06/08/2021   Procedure: Laminectomy and Foraminotomy - L4-L5, L5-S1;  Surgeon: Earnie Larsson, MD;  Location: [place];  Service: Neurosurgery;  Laterality: N/A;   SHOULDER ARTHROSCOPY WITH ROTATOR CUFF REPAIR Right 01/05/2013   Procedure: SHOULDER ARTHROSCOPY WITH ARTHROSCOPIC  ROTATOR CUFF REPAIR, ACROMIOPLASTY, EXTENSIVE DEBRIDEMENT;  Surgeon: Johnny Bridge, MD;  Location: [place];  Service: Orthopedics;  Laterality: Right;   TEE WITHOUT CARDIOVERSION N/A 09/22/2017   Procedure: TRANSESOPHAGEAL ECHOCARDIOGRAM (TEE);  Surgeon: Sanda Klein, MD;  Location: [facility];  Service: Cardiovascular;  Laterality: N/A;   TONSILLECTOMY     TUBAL LIGATION       Allergies  Allergen Reactions   Fluticasone Other (See Comments)    DRY EYES   Aciphex [Rabeprazole]     Critical    Codeine Nausea Only   Codeine    Cymbalta [Duloxetine Hcl]     Critical    Lexapro [Escitalopram]     critical   Lipitor [Atorvastatin Calcium]     Intolerant    Pneumovax [Pneumococcal Polysaccharide Vaccine]     moderate   Prevacid [Lansoprazole]     Unknown, listed on MAR   Rosuvastatin     Intolerant    Sertraline     critical   Statins     Critical    Sulfa Antibiotics     vomiting   Tetracyclines & Related     Morphine Sulfate   Welchol [Colesevelam]     Unknown, listed on MAR   Pneumovax 23 [Pneumococcal Vac Polyvalent] Rash    Injection site reaction    Outpatient Encounter Medications as of 11/23/2021  Medication Sig   acetaminophen (TYLENOL) 325 MG tablet Take 650 mg by mouth See admin instructions. Take 650 mg 3 times daily, may take 650 mg 2 times daily as needed for pain   acetaminophen (TYLENOL) 325 MG tablet Take 650 mg by mouth as directed. Take 2 tablets (650 mg) TID & Take 2 tablets (650 mg) BID PRN PAIN   aspirin EC 81 MG tablet Take 81 mg by mouth daily. Swallow whole.   Calcium Carb-Cholecalciferol (CALCIUM 500 + D PO) Take 1 tablet by mouth daily.   calcium carbonate (TUMS - DOSED IN MG ELEMENTAL CALCIUM) 500 MG chewable tablet Chew 2 tablets by mouth 2 (two) times daily as needed for indigestion or heartburn.   clonazePAM (KLONOPIN) 0.5 MG tablet Take 0.25 mg by mouth 2 (two) times daily as needed (for anxiety and shakiness).   clopidogrel (PLAVIX) 75 MG tablet Take 1 tablet (75 mg total) by mouth daily.   donepezil (ARICEPT) 10 MG tablet Take 10 mg by mouth daily.   gabapentin (NEURONTIN) 100 MG capsule Take 200 mg by mouth 2 (two) times daily.   gabapentin (NEURONTIN) 100 MG capsule Take 100 mg by mouth. Take in Afternoon   levothyroxine (SYNTHROID) 88 MCG tablet Take 88 mcg by mouth daily before breakfast.   melatonin 5 MG  TABS Take 5 mg by mouth at bedtime as needed (sleep).   mirabegron ER (MYRBETRIQ) 25 MG TB24 tablet Take 1 tablet (25 mg total) by mouth daily.   Multiple Vitamins-Minerals (PRESERVISION AREDS PO) Take 1 tablet by mouth 2 (two) times daily.    omeprazole (PRILOSEC) 40 MG capsule Take 40 mg by mouth daily.   propranolol (INDERAL) 40 MG tablet Take 40 mg by mouth 2 (two) times daily.   rosuvastatin (CRESTOR) 5 MG tablet Take 1 tablet (5 mg total) by mouth  daily.   No facility-administered encounter medications on file as of 11/23/2021.    Review of Systems:  Review of Systems  Constitutional:  Negative for activity change, appetite change, chills, diaphoresis, fatigue, fever and unexpected weight change.  HENT:  Negative for congestion.   Respiratory:  Negative for cough, shortness of breath and wheezing.   Cardiovascular:  Negative for chest pain, palpitations and leg swelling.  Gastrointestinal:  Negative for abdominal distention, abdominal pain, constipation and diarrhea.  Genitourinary:  Negative for difficulty urinating and dysuria.  Musculoskeletal:  Positive for back pain and gait problem. Negative for arthralgias, joint swelling and myalgias.  Neurological:  Negative for dizziness, tremors, seizures, syncope, facial asymmetry, speech difficulty, weakness, light-headedness, numbness and headaches.  Psychiatric/Behavioral:  Positive for confusion. Negative for agitation and behavioral problems.    Health Maintenance  Topic Date Due   Zoster Vaccines- Shingrix (2 of 2) 09/17/2021   COVID-19 Vaccine (3 - Booster for Janssen series) 04/27/2022 (Originally 09/30/2021)   TETANUS/TDAP  08/18/2022   Pneumonia Vaccine 77+ Years old  Completed   INFLUENZA VACCINE  Completed   DEXA SCAN  Completed   HPV VACCINES  Aged Out    Physical Exam: Vitals:   11/23/21 1429  BP: (!) 146/92  Pulse: 60  Temp: 97.7 F (36.5 C)  SpO2: 94%  Weight: 142 lb (64.4 kg)  Height: 5\' 3"  (1.6 m)   Body  mass index is 25.15 kg/m. Physical Exam Vitals and nursing note reviewed.  Constitutional:      General: She is not in acute distress.    Appearance: She is not diaphoretic.  HENT:     Head: Normocephalic and atraumatic.     Right Ear: There is impacted cerumen.     Left Ear: There is impacted cerumen.     Nose: Nose normal. No congestion.     Mouth/Throat:     Mouth: Mucous membranes are moist.     Pharynx: Oropharynx is clear. No oropharyngeal exudate.  Eyes:     Conjunctiva/sclera: Conjunctivae normal.     Pupils: Pupils are equal, round, and reactive to light.  Neck:     Vascular: No JVD.  Cardiovascular:     Rate and Rhythm: Normal rate and regular rhythm.     Heart sounds: No murmur heard. Pulmonary:     Effort: Pulmonary effort is normal. No respiratory distress.     Breath sounds: Normal breath sounds. No wheezing.  Abdominal:     General: Abdomen is flat. Bowel sounds are normal.     Palpations: Abdomen is soft.  Musculoskeletal:        General: No swelling, tenderness, deformity or signs of injury.     Right lower leg: No edema.     Left lower leg: No edema.  Skin:    General: Skin is warm and dry.  Neurological:     General: No focal deficit present.     Mental Status: She is alert. Mental status is at baseline.     Cranial Nerves: No cranial nerve deficit.     Comments: Strength 5/5 BLE Slow steady gait with walker.   Psychiatric:        Mood and Affect: Mood normal.    Labs reviewed: Basic Metabolic Panel: Recent Labs    02/09/21 0000 04/14/21 0000 06/04/21 0000 07/24/21 1533 07/30/21 0000 07/31/21 0000 08/06/21 0000 09/15/21 0000  NA 143  --    < > 133*   < > 148* 143 143  K  4.3  --    < > 3.3*   < > 4.1 4.2 4.1  CL 105  --    < > 101   < > 107 107 106  CO2 23*  --    < > 24   < > 24* 26* 27*  GLUCOSE  --   --   --  151*  --   --   --   --   BUN 10  --    < > 9   < > 7 11 11   CREATININE 0.8  --    < > 0.67   < > 0.7 0.7 0.7  CALCIUM 9.4   --    < > 8.6*   < > 9.5 9.1 9.3  TSH 0.13* 0.94  --   --   --   --   --   --    < > = values in this interval not displayed.   Liver Function Tests: Recent Labs    07/24/21 1533  AST 21  ALT 15  ALKPHOS 84  BILITOT 0.7  PROT 6.6  ALBUMIN 3.5   No results for input(s): LIPASE, AMYLASE in the last 8760 hours. No results for input(s): AMMONIA in the last 8760 hours. CBC: Recent Labs    06/04/21 0000 06/28/21 0000 07/24/21 1533  WBC 5.6 4.7 8.0  NEUTROABS  --  2.10  --   HGB 14.3 13.0 13.0  HCT 41 38 39.9  MCV  --   --  97.1  PLT 306 393 292   Lipid Panel: Recent Labs    04/14/21 0000  CHOL 191  HDL 61  LDLCALC 99  TRIG 156   Lab Results  Component Value Date   HGBA1C 5.6 09/21/2017    Procedures since last visit: No results found.  Assessment/Plan Essential hypertension Controlled  Goal <150/90  Tremor Controlled with propranolol   Lumbar stenosis with neurogenic claudication S/p laminectomy Did have improvement in pain and functional status but does still have some burning pain. Would not taper neurontin at this time. Continue walking with the walker.   Hyperlipidemia Lab Results  Component Value Date   LDLCALC 99 04/14/2021   Continue crestor.   Overactive bladder Improved symptoms of frequency and incontinence.  Continue Myrbetriq.   Hypothyroidism Lab Results  Component Value Date   TSH 0.94 04/14/2021   Continue Synthroid.   Vascular dementia without behavioral disturbance (HCC) Progressive memory loss is noted c/w the disease. No behaviors. Remains AL appropriate. Continue Aricept.   Labs/tests ordered:  * No order type specified *CBC BMP Lipid TSH prior to next apt.  Next appt:  4 months   Total time 36min:  time greater than 50% of total time spent doing pt counseling and coordination of care

## 2021-11-24 ENCOUNTER — Encounter: Payer: Self-pay | Admitting: Adult Health

## 2021-11-30 DIAGNOSIS — Z1152 Encounter for screening for COVID-19: Secondary | ICD-10-CM | POA: Diagnosis not present

## 2021-12-09 DIAGNOSIS — M5416 Radiculopathy, lumbar region: Secondary | ICD-10-CM | POA: Diagnosis not present

## 2021-12-17 DIAGNOSIS — F01B0 Vascular dementia, moderate, without behavioral disturbance, psychotic disturbance, mood disturbance, and anxiety: Secondary | ICD-10-CM | POA: Diagnosis not present

## 2021-12-17 DIAGNOSIS — M5416 Radiculopathy, lumbar region: Secondary | ICD-10-CM | POA: Diagnosis not present

## 2021-12-17 DIAGNOSIS — M5459 Other low back pain: Secondary | ICD-10-CM | POA: Diagnosis not present

## 2021-12-17 DIAGNOSIS — M48062 Spinal stenosis, lumbar region with neurogenic claudication: Secondary | ICD-10-CM | POA: Diagnosis not present

## 2022-01-20 DIAGNOSIS — Z20822 Contact with and (suspected) exposure to covid-19: Secondary | ICD-10-CM | POA: Diagnosis not present

## 2022-02-01 DIAGNOSIS — Z20822 Contact with and (suspected) exposure to covid-19: Secondary | ICD-10-CM | POA: Diagnosis not present

## 2022-02-04 ENCOUNTER — Encounter: Payer: Self-pay | Admitting: Nurse Practitioner

## 2022-02-04 ENCOUNTER — Ambulatory Visit (INDEPENDENT_AMBULATORY_CARE_PROVIDER_SITE_OTHER): Payer: Medicare Other | Admitting: Nurse Practitioner

## 2022-02-04 DIAGNOSIS — E2839 Other primary ovarian failure: Secondary | ICD-10-CM | POA: Diagnosis not present

## 2022-02-04 DIAGNOSIS — Z0000 Encounter for general adult medical examination without abnormal findings: Secondary | ICD-10-CM | POA: Diagnosis not present

## 2022-02-04 NOTE — Progress Notes (Signed)
? ?Subjective:  ? Bianca Garcia is a 81 y.o. female who presents for Medicare Annual (Subsequent) preventive examination. ? ?Review of Systems    ? ?Cardiac Risk Factors include: hypertension;dyslipidemia;advanced age (>21mn, >>53women) ? ?   ?Objective:  ?  ?There were no vitals filed for this visit. ?There is no height or weight on file to calculate BMI. ? ? ?  02/04/2022  ? 10:33 AM 07/24/2021  ?  4:00 PM 06/29/2021  ?  9:13 AM 06/16/2021  ?  2:35 PM 06/10/2021  ?  4:04 PM 06/08/2021  ? 10:33 AM 05/18/2021  ? 12:50 PM  ?Advanced Directives  ?Does Patient Have a Medical Advance Directive? Yes Yes Yes Yes Yes Yes Yes  ?Type of AParamed[place]of [place] will;Out of facility DNR (pink MOST or yellow form) Out of facility DNR (pink MOST or yellow form) HCedar HillLiving will;Out of facility DNR (pink MOST or yellow form) [place]Living will;Out of facility DNR (pink MOST or yellow form) [place]Living will;Out of facility DNR (pink MOST or yellow form) Living will;Healthcare Power of Attorney Out of facility DNR (pink MOST or yellow form);Living will  ?Does patient want to make changes to medical advance directive? No - Patient declined Yes (ED - Information included in AVS) No - Patient declined No - Patient declined No - Patient declined  No - Patient declined  ?Copy of [place] Chart? Yes - validated most recent copy scanned in chart (See row information)  Yes - validated most recent copy scanned in chart (See row information) Yes - validated most recent copy scanned in chart (See row information) Yes - validated most recent copy scanned in chart (See row information) Yes - validated most recent copy scanned in chart (See row information)   ?Pre-existing out of facility DNR order (yellow form or pink MOST form) Yellow form placed in chart (order not valid for inpatient use)      Yellow form placed in chart  (order not valid for inpatient use)  ? ? ?Current Medications (verified) ?Outpatient Encounter Medications as of 02/04/2022  ?Medication Sig  ? acetaminophen (TYLENOL) 325 MG tablet Take 650 mg by mouth See admin instructions. Take 650 mg 3 times daily, may take 650 mg 2 times daily as needed for pain  ? aspirin EC 81 MG tablet Take 81 mg by mouth daily. Swallow whole.  ? Calcium Carb-Cholecalciferol (CALCIUM 500 + D PO) Take 1 tablet by mouth daily.  ? calcium carbonate (TUMS - DOSED IN MG ELEMENTAL CALCIUM) 500 MG chewable tablet Chew 2 tablets by mouth 2 (two) times daily as needed for indigestion or heartburn.  ? clonazePAM (KLONOPIN) 0.5 MG tablet Take 0.25 mg by mouth 2 (two) times daily as needed (for anxiety and shakiness).  ? clopidogrel (PLAVIX) 75 MG tablet Take 1 tablet (75 mg total) by mouth daily.  ? diclofenac Sodium (VOLTAREN) 1 % GEL Apply topically 2 (two) times daily as needed.  ? donepezil (ARICEPT) 10 MG tablet Take 10 mg by mouth daily.  ? gabapentin (NEURONTIN) 100 MG capsule Take 200 mg by mouth 2 (two) times daily.  ? gabapentin (NEURONTIN) 100 MG capsule Take 100 mg by mouth. Take in morning  ? levothyroxine (SYNTHROID) 88 MCG tablet Take 88 mcg by mouth daily before breakfast.  ? melatonin 5 MG TABS Take 5 mg by mouth at bedtime as needed (sleep).  ? mirabegron ER (MYRBETRIQ) 25 MG  TB24 tablet Take 1 tablet (25 mg total) by mouth daily.  ? Multiple Vitamins-Minerals (PRESERVISION AREDS PO) Take 1 tablet by mouth 2 (two) times daily.   ? omeprazole (PRILOSEC) 40 MG capsule Take 40 mg by mouth daily.  ? propranolol (INDERAL) 40 MG tablet Take 40 mg by mouth 2 (two) times daily.  ? rosuvastatin (CRESTOR) 5 MG tablet Take 1 tablet (5 mg total) by mouth daily.  ? [DISCONTINUED] acetaminophen (TYLENOL) 325 MG tablet Take 650 mg by mouth as directed. Take 2 tablets (650 mg) TID & Take 2 tablets (650 mg) BID PRN PAIN  ? ?No facility-administered encounter medications on file as of 02/04/2022.   ? ? ?Allergies (verified) ?Fluticasone, Aciphex [rabeprazole], Codeine, Codeine, Cymbalta [duloxetine hcl], Lexapro [escitalopram], Lipitor [atorvastatin calcium], Pneumovax [pneumococcal polysaccharide vaccine], Prevacid [lansoprazole], Rosuvastatin, Sertraline, Statins, Sulfa antibiotics, Tetracyclines & related, Welchol [colesevelam], and Pneumovax 23 [pneumococcal vac polyvalent]  ? ?History: ?Past Medical History:  ?Diagnosis Date  ? Anxiety   ? Arthritis   ? Biceps tendonitis 01/05/2013  ? 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 Cerebral infarction [facility])   ? CTS (carpal tunnel syndrome) 1995  ? Degenerative disc disease, cervical   ? Dementia ([place])   ? DJD (degenerative joint disease) 2016  ? DDD with spinal stenosis. Street of back injections by Dr. Brien Few 2016 with good relief, history of cervical DD with possiblity of surgery by Dr. Trenton Gammon.  ? GERD (gastroesophageal reflux disease)   ? IBS with moderate refluc seen on upper GI study form January 2011 Dr. Watt Climes  ? HTN (hypertension)   ? no meds  ? Hyperlipidemia   ? Hypothyroidism   ? IBS (irritable bowel syndrome)   ? Imbalance   ? Impingement syndrome of right shoulder 01/05/2013  ? Mass of left side of neck   ? Memory loss   ? Mood disorder ([place])   ? MVP (mitral valve prolapse)   ? Hx of   ? Osteopenia   ? Peripheral edema   ? Right rotator cuff tear 01/05/2013  ? Sciatic pain   ? recurrent  ? Seasonal allergies   ? Stroke [facility])   ? Tension headache   ? TIA (transient ischemic attack)   ? Tremor   ? ?Past Surgical History:  ?Procedure Laterality Date  ? ANTERIOR CERVICAL DECOMP/DISCECTOMY FUSION  04/2015   ? Dr. Pamala Hurry, [place]  ? CARDIAC CATHETERIZATION  2004  ? which revealed smooth and normal coronary arteries  ? CARPAL TUNNEL RELEASE  2000  ? lt  ? CATARACT EXTRACTION W/ INTRAOCULAR LENS  IMPLANT, BILATERAL  1/17  ? Dr. Lucita Ferrara  ? COSMETIC SURGERY  2010  ? eyes-facial  ? DENTAL SURGERY  1960  ? LASIK    ? LOOP RECORDER INSERTION N/A 09/22/2017  ?  Procedure: LOOP RECORDER INSERTION;  Surgeon: Constance Haw, MD;  Location: [place] [facility];  Service: Cardiovascular;  Laterality: N/A;  ? LUMBAR LAMINECTOMY/DECOMPRESSION MICRODISCECTOMY N/A 06/08/2021  ? Procedure: Laminectomy and Foraminotomy - L4-L5, L5-S1;  Surgeon: Earnie Larsson, MD;  Location: [place];  Service: Neurosurgery;  Laterality: N/A;  ? SHOULDER ARTHROSCOPY WITH ROTATOR CUFF REPAIR Right 01/05/2013  ? Procedure: SHOULDER ARTHROSCOPY WITH ARTHROSCOPIC  ROTATOR CUFF REPAIR, ACROMIOPLASTY, EXTENSIVE DEBRIDEMENT;  Surgeon: Johnny Bridge, MD;  Location: [place];  Service: Orthopedics;  Laterality: Right;  ? TEE WITHOUT CARDIOVERSION N/A 09/22/2017  ? Procedure: TRANSESOPHAGEAL ECHOCARDIOGRAM (TEE);  Surgeon: Sanda Klein, MD;  Location: [place];  Service: Cardiovascular;  Laterality:  N/A;  ? TONSILLECTOMY    ? TUBAL LIGATION    ? ?Family History  ?Problem Relation Age of Onset  ? Bladder Cancer Father   ? Mitral valve prolapse Mother   ? Cancer Maternal Grandfather   ? ?Social History  ? ?Socioeconomic History  ? Marital status: Married  ?  Spouse name: Not on file  ? Number of children: 2  ? Years of education: 16 years  ? Highest education level: Bachelor's degree (e.g., BA, AB, BS)  ?Occupational History  ? Occupation: Retired  ?Tobacco Use  ? Smoking status: Former  ?  Types: Cigarettes  ?  Quit date: 01/05/1960  ?  Years since quitting: 62.1  ? Smokeless tobacco: Never  ? Tobacco comments:  ?  Remote Hx  ?  Patient smoked in high school   ?Vaping Use  ? Vaping Use: Never used  ?Substance and Sexual Activity  ? Alcohol use: Not Currently  ?  Comment: rare  ? Drug use: No  ? Sexual activity: Not Currently  ?  Birth control/protection: Post-menopausal  ?Other Topics Concern  ? Not on file  ?Social History Narrative  ? ** Merged History Encounter **  ?    ? Lives at home with her husband. ?Left-handed. ?Occasional caffeine use.  ? ?Social Determinants of Health   ? ?Financial Resource Strain: Not on file  ?Food Insecurity: Not on file  ?Transportation Needs: Not on file  ?Physical Activity: Not on file  ?Stress: Not on file  ?Social Connections: Not on file  ? ? ?Tobacco Counselin

## 2022-02-04 NOTE — Patient Instructions (Signed)
Ms. Tyer , ?Thank you for taking time to come for your Medicare Wellness Visit. I appreciate your ongoing commitment to your health goals. Please review the following plan we discussed and let me know if I can assist you in the future.  ? ?Screening recommendations/referrals: ?Colonoscopy aged out ?Mammogram aged out ?Bone Density order placed today- call [phone] to schedule.  ?Recommended yearly ophthalmology/optometry visit for glaucoma screening and checkup ?Recommended yearly dental visit for hygiene and checkup ? ?Vaccinations: ?Influenza vaccine up to date ?Pneumococcal vaccine up to date ?Tdap vaccine up to date ?Shingles vaccine DUE- recommend to get at your local pharmacy      ? ?Advanced directives: on file.  ? ?Conditions/risks identified: advanced age, memory loss ? ?Next appointment: yearly  ? ? ?Preventive Care 40 Years and Older, Female ?Preventive care refers to lifestyle choices and visits with your health care provider that can promote health and wellness. ?What does preventive care include? ?A yearly physical exam. This is also called an annual well check. ?Dental exams once or twice a year. ?Routine eye exams. Ask your health care provider how often you should have your eyes checked. ?Personal lifestyle choices, including: ?Daily care of your teeth and gums. ?Regular physical activity. ?Eating a healthy diet. ?Avoiding tobacco and drug use. ?Limiting alcohol use. ?Practicing safe sex. ?Taking low-dose aspirin every day. ?Taking vitamin and mineral supplements as recommended by your health care provider. ?What happens during an annual well check? ?The services and screenings done by your health care provider during your annual well check will depend on your age, overall health, lifestyle risk factors, and family history of disease. ?Counseling  ?Your health care provider may ask you questions about your: ?Alcohol use. ?Tobacco use. ?Drug use. ?Emotional well-being. ?Home and relationship  well-being. ?Sexual activity. ?Eating habits. ?History of falls. ?Memory and ability to understand (cognition). ?Work and work Statistician. ?Reproductive health. ?Screening  ?You may have the following tests or measurements: ?Height, weight, and BMI. ?Blood pressure. ?Lipid and cholesterol levels. These may be checked every 5 years, or more frequently if you are over 61 years old. ?Skin check. ?Lung cancer screening. You may have this screening every year starting at age 33 if you have a 30-pack-year history of smoking and currently smoke or have quit within the past 15 years. ?Fecal occult blood test (FOBT) of the stool. You may have this test every year starting at age 69. ?Flexible sigmoidoscopy or colonoscopy. You may have a sigmoidoscopy every 5 years or a colonoscopy every 10 years starting at age 39. ?Hepatitis C blood test. ?Hepatitis B blood test. ?Sexually transmitted disease (STD) testing. ?Diabetes screening. This is done by checking your blood sugar (glucose) after you have not eaten for a while (fasting). You may have this done every 1-3 years. ?Bone density scan. This is done to screen for osteoporosis. You may have this done starting at age 22. ?Mammogram. This may be done every 1-2 years. Talk to your health care provider about how often you should have regular mammograms. ?Talk with your health care provider about your test results, treatment options, and if necessary, the need for more tests. ?Vaccines  ?Your health care provider may recommend certain vaccines, such as: ?Influenza vaccine. This is recommended every year. ?Tetanus, diphtheria, and acellular pertussis (Tdap, Td) vaccine. You may need a Td booster every 10 years. ?Zoster vaccine. You may need this after age 100. ?Pneumococcal 13-valent conjugate (PCV13) vaccine. One dose is recommended after age 14. ?Pneumococcal polysaccharide (PPSV23)  vaccine. One dose is recommended after age 71. ?Talk to your health care provider about which  screenings and vaccines you need and how often you need them. ?This information is not intended to replace advice given to you by your health care provider. Make sure you discuss any questions you have with your health care provider. ?Document Released: 10/10/2015 Document Revised: 06/02/2016 Document Reviewed: 07/15/2015 ?Elsevier Interactive Patient Education ? 2017 [employer]. ? ?Fall Prevention in the Home ?Falls can cause injuries. They can happen to people of all ages. There are many things you can do to make your home safe and to help prevent falls. ?What can I do on the outside of my home? ?Regularly fix the edges of walkways and driveways and fix any cracks. ?Remove anything that might make you trip as you walk through a door, such as a raised step or threshold. ?Trim any bushes or trees on the path to your home. ?Use bright outdoor lighting. ?Clear any walking paths of anything that might make someone trip, such as rocks or tools. ?Regularly check to see if handrails are loose or broken. Make sure that both sides of any steps have handrails. ?Any raised decks and porches should have guardrails on the edges. ?Have any leaves, snow, or ice cleared regularly. ?Use sand or salt on walking paths during winter. ?Clean up any spills in your garage right away. This includes oil or grease spills. ?What can I do in the bathroom? ?Use night lights. ?Install grab bars by the toilet and in the tub and shower. Do not use towel bars as grab bars. ?Use non-skid mats or decals in the tub or shower. ?If you need to sit down in the shower, use a plastic, non-slip stool. ?Keep the floor dry. Clean up any water that spills on the floor as soon as it happens. ?Remove soap buildup in the tub or shower regularly. ?Attach bath mats securely with double-sided non-slip rug tape. ?Do not have throw rugs and other things on the floor that can make you trip. ?What can I do in the bedroom? ?Use night lights. ?Make sure that you have a  light by your bed that is easy to reach. ?Do not use any sheets or blankets that are too big for your bed. They should not hang down onto the floor. ?Have a firm chair that has side arms. You can use this for support while you get dressed. ?Do not have throw rugs and other things on the floor that can make you trip. ?What can I do in the kitchen? ?Clean up any spills right away. ?Avoid walking on wet floors. ?Keep items that you use a lot in easy-to-reach places. ?If you need to reach something above you, use a strong step stool that has a grab bar. ?Keep electrical cords out of the way. ?Do not use floor polish or wax that makes floors slippery. If you must use wax, use non-skid floor wax. ?Do not have throw rugs and other things on the floor that can make you trip. ?What can I do with my stairs? ?Do not leave any items on the stairs. ?Make sure that there are handrails on both sides of the stairs and use them. Fix handrails that are broken or loose. Make sure that handrails are as long as the stairways. ?Check any carpeting to make sure that it is firmly attached to the stairs. Fix any carpet that is loose or worn. ?Avoid having throw rugs at the top or bottom  of the stairs. If you do have throw rugs, attach them to the floor with carpet tape. ?Make sure that you have a light switch at the top of the stairs and the bottom of the stairs. If you do not have them, ask someone to add them for you. ?What else can I do to help prevent falls? ?Wear shoes that: ?Do not have high heels. ?Have rubber bottoms. ?Are comfortable and fit you well. ?Are closed at the toe. Do not wear sandals. ?If you use a stepladder: ?Make sure that it is fully opened. Do not climb a closed stepladder. ?Make sure that both sides of the stepladder are locked into place. ?Ask someone to hold it for you, if possible. ?Clearly mark and make sure that you can see: ?Any grab bars or handrails. ?First and last steps. ?Where the edge of each step  is. ?Use tools that help you move around (mobility aids) if they are needed. These include: ?Canes. ?Walkers. ?Scooters. ?Crutches. ?Turn on the lights when you go into a dark area. Replace any light bulbs as soon

## 2022-02-04 NOTE — Progress Notes (Signed)
This service is provided via telemedicine ? ?No vital signs collected/recorded due to the encounter was a telemedicine visit.  ? ?Location of patient (ex: home, work):  Home ? ?Patient consents to a telephone visit:  Yes, see encounter dated 02/04/2022 ? ?Location of the provider (ex: office, home):  [place] ? ?Name of any referring provider:  Veleta Miners, MD ? ?Names of all persons participating in the telemedicine service and their role in the encounter:  Sherrie Mustache, Nurse Practitioner, Carroll Kinds, CMA, and patient.  ? ?Time spent on call:  10 minutes with medical assistant ? ?

## 2022-02-26 DIAGNOSIS — E039 Hypothyroidism, unspecified: Secondary | ICD-10-CM | POA: Diagnosis not present

## 2022-02-26 DIAGNOSIS — E785 Hyperlipidemia, unspecified: Secondary | ICD-10-CM | POA: Diagnosis not present

## 2022-03-10 DIAGNOSIS — M5416 Radiculopathy, lumbar region: Secondary | ICD-10-CM | POA: Diagnosis not present

## 2022-03-16 ENCOUNTER — Other Ambulatory Visit: Payer: Self-pay

## 2022-03-22 DIAGNOSIS — E039 Hypothyroidism, unspecified: Secondary | ICD-10-CM | POA: Diagnosis not present

## 2022-03-22 DIAGNOSIS — E785 Hyperlipidemia, unspecified: Secondary | ICD-10-CM | POA: Diagnosis not present

## 2022-03-22 LAB — CBC: RBC: 4.33 (ref 3.87–5.11)

## 2022-03-22 LAB — CBC AND DIFFERENTIAL
HCT: 41 (ref 36–46)
Hemoglobin: 13.6 (ref 12.0–16.0)
Platelets: 238 10*3/uL (ref 150–400)
WBC: 5.9

## 2022-03-23 ENCOUNTER — Encounter: Payer: Self-pay | Admitting: Internal Medicine

## 2022-03-23 LAB — BASIC METABOLIC PANEL
BUN: 12 (ref 4–21)
CO2: 24 — AB (ref 13–22)
Chloride: 105 (ref 99–108)
Creatinine: 0.7 (ref 0.5–1.1)
Glucose: 99
Potassium: 4.3 mEq/L (ref 3.5–5.1)
Sodium: 142 (ref 137–147)

## 2022-03-23 LAB — TSH: TSH: 1.55 (ref 0.41–5.90)

## 2022-03-23 LAB — LIPID PANEL
Cholesterol: 174 (ref 0–200)
HDL: 66 (ref 35–70)
LDL Cholesterol: 81
LDl/HDL Ratio: 2.6
Triglycerides: 134 (ref 40–160)

## 2022-03-23 LAB — COMPREHENSIVE METABOLIC PANEL: Calcium: 9.4 (ref 8.7–10.7)

## 2022-03-24 ENCOUNTER — Encounter: Payer: Self-pay | Admitting: Internal Medicine

## 2022-03-24 ENCOUNTER — Non-Acute Institutional Stay: Payer: Medicare Other | Admitting: Internal Medicine

## 2022-03-24 VITALS — BP 136/84 | HR 72 | Temp 97.5°F | Ht 63.0 in | Wt 149.0 lb

## 2022-03-24 DIAGNOSIS — M5442 Lumbago with sciatica, left side: Secondary | ICD-10-CM | POA: Diagnosis not present

## 2022-03-24 DIAGNOSIS — F419 Anxiety disorder, unspecified: Secondary | ICD-10-CM | POA: Diagnosis not present

## 2022-03-24 DIAGNOSIS — E039 Hypothyroidism, unspecified: Secondary | ICD-10-CM

## 2022-03-24 DIAGNOSIS — G8929 Other chronic pain: Secondary | ICD-10-CM

## 2022-03-24 DIAGNOSIS — Z8673 Personal history of transient ischemic attack (TIA), and cerebral infarction without residual deficits: Secondary | ICD-10-CM | POA: Diagnosis not present

## 2022-03-24 DIAGNOSIS — M5441 Lumbago with sciatica, right side: Secondary | ICD-10-CM

## 2022-03-24 DIAGNOSIS — I10 Essential (primary) hypertension: Secondary | ICD-10-CM

## 2022-03-24 DIAGNOSIS — R251 Tremor, unspecified: Secondary | ICD-10-CM | POA: Diagnosis not present

## 2022-03-24 DIAGNOSIS — E782 Mixed hyperlipidemia: Secondary | ICD-10-CM | POA: Diagnosis not present

## 2022-03-24 DIAGNOSIS — N3946 Mixed incontinence: Secondary | ICD-10-CM | POA: Diagnosis not present

## 2022-03-24 DIAGNOSIS — F0150 Vascular dementia without behavioral disturbance: Secondary | ICD-10-CM

## 2022-03-24 NOTE — Progress Notes (Signed)
Location:  [place] of Service:  Clinic (12)  Provider:   Code Status:  Goals of Care:     02/04/2022   10:33 AM  Advanced Directives  Does Patient Have a Medical Advance Directive? Yes  Type of Paramedic of [place];Living will;Out of facility DNR (pink MOST or yellow form)  Does patient want to make changes to medical advance directive? No - Patient declined  Copy of [place] in Chart? Yes - validated most recent copy scanned in chart (See row information)  Pre-existing out of facility DNR order (yellow form or pink MOST form) Yellow form placed in chart (order not valid for inpatient use)     Chief Complaint  Patient presents with   Medical Management of Chronic Issues    Medical Management of Chronic Issues. 4 Month follow up    HPI: Patient is a 81 y.o. female seen today for medical management of chronic diseases.    Lives in [place] with her husband has h/o  Vascular dementia S/p CVA in 2018 and Repeat MRI has shown Chronic Vessel Changes Essential Tremor, Anxiety, Brachial Plexux Palsy, Chronic Pain in her Left Knee, Hypothyroidism, Hyperlipidemia,  S/P L4-5, L5-S1 decompressive laminectomy with foraminotomies due to Severe Back pain Done on 06/08/21  Patient continues to have issues with Pain in her Mid back and ower back area She thinks surgery did not help her But walking well now Able to take her dog out for some walk Say her pain is mostly burning pain  No Sciatica like Symptoms No weakness Continues to have Memory issues  But doing well in AL setting  Past Medical History:  Diagnosis Date   Anxiety    Arthritis    Biceps tendonitis 01/05/2013   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 Street of  back injections by Dr. Brien Few 2016 with good relief, history of cervical DD with possiblity of surgery by Dr. Trenton Gammon.   GERD (gastroesophageal reflux disease)    IBS with moderate refluc seen on upper GI study form January 2011 Dr. Watt Climes   HTN (hypertension)    no meds   Hyperlipidemia    Hypothyroidism    IBS (irritable bowel syndrome)    Imbalance    Impingement syndrome of right shoulder 01/05/2013   Mass of left side of neck    Memory loss    Mood disorder ([place])    MVP (mitral valve prolapse)    Hx of    Osteopenia    Peripheral edema    Right rotator cuff tear 01/05/2013   Sciatic pain    recurrent   Seasonal allergies    Stroke [facility])    Tension headache    TIA (transient ischemic attack)    Tremor     Past Surgical History:  Procedure Laterality Date   ANTERIOR CERVICAL DECOMP/DISCECTOMY FUSION  04/2015    Dr. Pamala Hurry, [place]  2004   which revealed smooth and normal coronary arteries   CARPAL TUNNEL RELEASE  2000   lt   CATARACT EXTRACTION W/ INTRAOCULAR LENS  IMPLANT, BILATERAL  1/17   Dr. Lucita Ferrara   COSMETIC SURGERY  2010   eyes-facial   [place]   LASIK  LOOP RECORDER INSERTION N/A 09/22/2017   Procedure: LOOP RECORDER INSERTION;  Surgeon: Constance Haw, MD;  Location: [facility];  Service: Cardiovascular;  Laterality: N/A;   LUMBAR LAMINECTOMY/DECOMPRESSION MICRODISCECTOMY N/A 06/08/2021   Procedure: Laminectomy and Foraminotomy - L4-L5, L5-S1;  Surgeon: Earnie Larsson, MD;  Location: [facility];  Service: Neurosurgery;  Laterality: N/A;   SHOULDER ARTHROSCOPY WITH ROTATOR CUFF REPAIR Right 01/05/2013   Procedure: SHOULDER ARTHROSCOPY WITH ARTHROSCOPIC  ROTATOR CUFF REPAIR, ACROMIOPLASTY, EXTENSIVE DEBRIDEMENT;  Surgeon: Johnny Bridge, MD;  Location: [facility];  Service: Orthopedics;  Laterality: Right;   TEE WITHOUT CARDIOVERSION N/A 09/22/2017   Procedure: TRANSESOPHAGEAL ECHOCARDIOGRAM (TEE);   Surgeon: Sanda Klein, MD;  Location: [facility];  Service: Cardiovascular;  Laterality: N/A;   TONSILLECTOMY     TUBAL LIGATION      Allergies  Allergen Reactions   Fluticasone Other (See Comments)    DRY EYES   Aciphex [Rabeprazole]     Critical    Codeine Nausea Only   Codeine    Cymbalta [Duloxetine Hcl]     Critical    Lexapro [Escitalopram]     critical   Lipitor [Atorvastatin Calcium]     Intolerant    Pneumovax [Pneumococcal Polysaccharide Vaccine]     moderate   Prevacid [Lansoprazole]     Unknown, listed on MAR   Rosuvastatin     Intolerant    Sertraline     critical   Statins     Critical    Sulfa Antibiotics     vomiting   Tetracyclines & Related     Morphine Sulfate   Welchol [Colesevelam]     Unknown, listed on MAR   Pneumovax 23 [Pneumococcal Vac Polyvalent] Rash    Injection site reaction    Outpatient Encounter Medications as of 03/24/2022  Medication Sig   acetaminophen (TYLENOL) 325 MG tablet Take 650 mg by mouth See admin instructions. Take 650 mg 3 times daily, may take 650 mg 2 times daily as needed for pain   aspirin EC 81 MG tablet Take 81 mg by mouth daily. Swallow whole.   Calcium Carb-Cholecalciferol (CALCIUM 500 + D PO) Take 1 tablet by mouth daily.   calcium carbonate (TUMS - DOSED IN MG ELEMENTAL CALCIUM) 500 MG chewable tablet Chew 2 tablets by mouth 2 (two) times daily as needed for indigestion or heartburn.   clonazePAM (KLONOPIN) 0.5 MG tablet Take 0.25 mg by mouth 2 (two) times daily as needed (for anxiety and shakiness).   clopidogrel (PLAVIX) 75 MG tablet Take 1 tablet (75 mg total) by mouth daily.   diclofenac Sodium (VOLTAREN) 1 % GEL Apply topically 2 (two) times daily as needed.   donepezil (ARICEPT) 10 MG tablet Take 10 mg by mouth daily.   gabapentin (NEURONTIN) 100 MG capsule Take 200 mg by mouth 2 (two) times daily.   gabapentin (NEURONTIN) 100 MG capsule Take 100 mg by mouth. Take in morning   levothyroxine  (SYNTHROID) 88 MCG tablet Take 88 mcg by mouth daily before breakfast.   melatonin 5 MG TABS Take 5 mg by mouth at bedtime as needed (sleep).   mirabegron ER (MYRBETRIQ) 25 MG TB24 tablet Take 1 tablet (25 mg total) by mouth daily.   Multiple Vitamins-Minerals (PRESERVISION AREDS PO) Take 1 tablet by mouth 2 (two) times daily.    omeprazole (PRILOSEC) 40 MG capsule Take 40 mg by mouth daily.   propranolol (INDERAL) 40 MG tablet Take 40 mg by mouth 2 (  two) times daily.   rosuvastatin (CRESTOR) 5 MG tablet Take 1 tablet (5 mg total) by mouth daily.   No facility-administered encounter medications on file as of 03/24/2022.    Review of Systems:  Review of Systems  Constitutional:  Negative for activity change and appetite change.  HENT: Negative.    Respiratory:  Negative for cough and shortness of breath.   Cardiovascular:  Negative for leg swelling.  Gastrointestinal:  Negative for constipation.  Genitourinary: Negative.   Musculoskeletal:  Positive for back pain. Negative for arthralgias, gait problem and myalgias.  Skin: Negative.   Neurological:  Negative for dizziness and weakness.  Psychiatric/Behavioral:  Positive for confusion. Negative for dysphoric mood and sleep disturbance.     Health Maintenance  Topic Date Due   COVID-19 Vaccine (4 - Booster for Janssen series) 04/27/2022 (Originally 09/30/2021)   INFLUENZA VACCINE  04/27/2022   TETANUS/TDAP  08/18/2022   Pneumonia Vaccine 64+ Years old  Completed   DEXA SCAN  Completed   Zoster Vaccines- Shingrix  Completed   HPV VACCINES  Aged Out    Physical Exam: Vitals:   03/24/22 1024  BP: 136/84  Pulse: 72  Temp: (!) 97.5 F (36.4 C)  TempSrc: Skin  SpO2: 96%  Weight: 149 lb (67.6 kg)  Height: '5\' 3"'$  (1.6 m)   Body mass index is 26.39 kg/m. Physical Exam Vitals reviewed.  Constitutional:      Appearance: Normal appearance.  HENT:     Head: Normocephalic.     Nose: Nose normal.     Mouth/Throat:     Mouth:  Mucous membranes are moist.     Pharynx: Oropharynx is clear.  Eyes:     Pupils: Pupils are equal, round, and reactive to light.  Cardiovascular:     Rate and Rhythm: Normal rate and regular rhythm.     Pulses: Normal pulses.     Heart sounds: Normal heart sounds. No murmur heard. Pulmonary:     Effort: Pulmonary effort is normal.     Breath sounds: Normal breath sounds.  Abdominal:     General: Abdomen is flat. Bowel sounds are normal.     Palpations: Abdomen is soft.  Musculoskeletal:        General: No swelling.     Cervical back: Neck supple.  Skin:    General: Skin is warm.  Neurological:     General: No focal deficit present.     Mental Status: She is alert.     Comments: Straight Leg Negative Gait stable   Psychiatric:        Mood and Affect: Mood normal.        Thought Content: Thought content normal.     Labs reviewed: Basic Metabolic Panel: Recent Labs    04/14/21 0000 06/04/21 0000 07/24/21 1533 07/30/21 0000 08/06/21 0000 09/15/21 0000 03/23/22 0000  NA  --    < > 133*   < > 143 143 142  K  --    < > 3.3*   < > 4.2 4.1 4.3  CL  --    < > 101   < > 107 106 105  CO2  --    < > 24   < > 26* 27* 24*  GLUCOSE  --   --  151*  --   --   --   --   BUN  --    < > 9   < > '11 11 12  '$ CREATININE  --    < >  0.67   < > 0.7 0.7 0.7  CALCIUM  --    < > 8.6*   < > 9.1 9.3 9.4  TSH 0.94  --   --   --   --   --  1.55   < > = values in this interval not displayed.   Liver Function Tests: Recent Labs    07/24/21 1533  AST 21  ALT 15  ALKPHOS 84  BILITOT 0.7  PROT 6.6  ALBUMIN 3.5   No results for input(s): "LIPASE", "AMYLASE" in the last 8760 hours. No results for input(s): "AMMONIA" in the last 8760 hours. CBC: Recent Labs    06/28/21 0000 07/24/21 1533 03/22/22 0000  WBC 4.7 8.0 5.9  NEUTROABS 2.10  --   --   HGB 13.0 13.0 13.6  HCT 38 39.9 41  MCV  --  97.1  --   PLT 393 292 238   Lipid Panel: Recent Labs    04/14/21 0000 03/23/22 0000   CHOL 191 174  HDL 61 66  LDLCALC 99 81  TRIG 156 134   Lab Results  Component Value Date   HGBA1C 5.6 09/21/2017    Procedures since last visit: No results found.  Assessment/Plan  1. Chronic bilateral low back pain with bilateral sciatica Per Dr Trenton Gammon Conservative management Will change her Morning Neurontin to 200 mg Patient has h/o getting lethargic with higher dose She continues to walk and do her ADLS Will control Pain   2. Vascular dementia without behavioral disturbance (HCC) Last MMSE 21/30 in 10/22 Will try Namenda 5 mg BID Already on Aricept  3. Essential hypertension On Inderal  4. Mixed hyperlipidemia Statin LDL good levels 5. Acquired hypothyroidism TSH normal  6. Mixed stress and urge urinary incontinence On Myrbetriq  7. Tremor On Inderal  8. H/O: CVA (cerebrovascular accident) On aspirin and Plavix  Will discontinue Aspirin. Also on Statin  9. Anxiety On Klonipin PRn Allergics to SSRi  Labs/tests ordered:  * No order type specified * Next appt:  07/28/2022

## 2022-03-26 ENCOUNTER — Other Ambulatory Visit: Payer: Self-pay

## 2022-03-26 MED ORDER — DONEPEZIL HCL 10 MG PO TABS: 10.0000 mg | ORAL_TABLET | Freq: Every day | ORAL | 1 refills | Status: DC

## 2022-03-26 NOTE — Telephone Encounter (Signed)
Pharmacy send a fax requesting a 90 days refill on Donepezil 10 MG and medication never filled by Dr. Lyndel Safe.

## 2022-04-15 DIAGNOSIS — M25551 Pain in right hip: Secondary | ICD-10-CM | POA: Diagnosis not present

## 2022-05-11 DIAGNOSIS — M545 Low back pain, unspecified: Secondary | ICD-10-CM | POA: Diagnosis not present

## 2022-06-09 ENCOUNTER — Encounter: Payer: Self-pay | Admitting: Cardiovascular Disease

## 2022-06-09 NOTE — Progress Notes (Signed)
This encounter was created in error - please disregard.

## 2022-06-10 ENCOUNTER — Ambulatory Visit: Payer: Medicare Other | Attending: Cardiovascular Disease | Admitting: Cardiovascular Disease

## 2022-06-16 ENCOUNTER — Other Ambulatory Visit: Payer: Self-pay

## 2022-06-16 NOTE — Telephone Encounter (Signed)
Refill request received from pharmacy. Allergy warnings came up when trying to fill medication.  Medication pended and sent to Dr. Veleta Miners

## 2022-06-22 ENCOUNTER — Telehealth: Payer: Self-pay

## 2022-06-22 NOTE — Telephone Encounter (Signed)
Pharmacy send a rx request for memantine HCL 5 mg for a 90 days supply. Medciation is not listed on active med list.

## 2022-06-23 MED ORDER — ROSUVASTATIN CALCIUM 5 MG PO TABS: 5.0000 mg | ORAL_TABLET | Freq: Every day | ORAL | 3 refills | Status: DC

## 2022-07-02 DIAGNOSIS — Z23 Encounter for immunization: Secondary | ICD-10-CM | POA: Diagnosis not present

## 2022-07-19 ENCOUNTER — Non-Acute Institutional Stay: Payer: Medicare Other | Admitting: Adult Health

## 2022-07-19 ENCOUNTER — Encounter: Payer: Self-pay | Admitting: Adult Health

## 2022-07-19 VITALS — BP 124/84 | HR 70 | Temp 98.1°F | Resp 17 | Ht 63.0 in | Wt 148.6 lb

## 2022-07-19 DIAGNOSIS — F419 Anxiety disorder, unspecified: Secondary | ICD-10-CM

## 2022-07-19 DIAGNOSIS — R200 Anesthesia of skin: Secondary | ICD-10-CM

## 2022-07-19 MED ORDER — BUSPIRONE HCL 10 MG PO TABS: 10.0000 mg | ORAL_TABLET | Freq: Two times a day (BID) | ORAL | 0 refills | Status: DC

## 2022-07-19 NOTE — Progress Notes (Signed)
[facility]  POS: Clinic  Provider:  Cindi Carbon, [place] [id number]   Code Status: DNR Goals of Care:     07/19/2022    2:02 PM  Advanced Directives  Does Patient Have a Medical Advance Directive? Yes  Type of Paramedic of [place];Living will;Out of facility DNR (pink MOST or yellow form)  Does patient want to make changes to medical advance directive? No - Patient declined  Copy of [place] in Chart? Yes - validated most recent copy scanned in chart (See row information)     Chief Complaint  Patient presents with   Acute Visit    Patient complains of facial tingling    HPI: Patient is a 81 y.o. female seen today for an acute visit for facial tingling.  Nurse wrote an Paramedic stating patient had facial tingling, tongue numbness, and twitching two days ago. BP at the time was 148/85.  No facial droop, speech was clear, and no limitations in ROM. She does have a hx of TIA and is on plavix and statin.  She recent took methylprednisolone for back pain. On neurontin for back pain which is not new, followed by neurosurgery. She denies any feelings of facial numbness or tingling. Seems to be back to baseline  Her husband is requesting labs.   Continues to have anxiety, feels like she is panicking because her dog has fleas and needs treatment She uses clonazepam prn for anxiety.   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 Street of back injections by Dr. Brien Few 2016 with good relief, history of cervical DD with possiblity of surgery by Dr. Trenton Gammon.   GERD (gastroesophageal reflux disease)    IBS with moderate refluc seen on upper GI  study form January 2011 Dr. Watt Climes   HTN (hypertension)    no meds   Hyperlipidemia    Hypothyroidism    IBS (irritable bowel syndrome)    Imbalance    Impingement syndrome of right shoulder 01/05/2013   Mass of left side of neck    Memory loss    Mood disorder ([place])    MVP (mitral valve prolapse)    Hx of    Osteopenia    Peripheral edema    Right rotator cuff tear 01/05/2013   Sciatic pain    recurrent   Seasonal allergies    Stroke [facility])    Tension headache    TIA (transient ischemic attack)    Tremor     Past Surgical History:  Procedure Laterality Date   ANTERIOR CERVICAL DECOMP/DISCECTOMY FUSION  04/2015    Dr. Pamala Hurry, [employer]  2004   which revealed smooth and normal coronary arteries   CARPAL TUNNEL RELEASE  2000   lt   CATARACT EXTRACTION W/ INTRAOCULAR LENS  IMPLANT, BILATERAL  1/17   Dr. Lucita Ferrara   COSMETIC SURGERY  2010   eyes-facial   DENTAL SURGERY  1960   LASIK     LOOP RECORDER INSERTION N/A 09/22/2017   Procedure: LOOP RECORDER INSERTION;  Surgeon: Constance Haw, MD;  Location: [place] CV LAB;  Service: Cardiovascular;  Laterality: N/A;  LUMBAR LAMINECTOMY/DECOMPRESSION MICRODISCECTOMY N/A 06/08/2021   Procedure: Laminectomy and Foraminotomy - L4-L5, L5-S1;  Surgeon: Earnie Larsson, MD;  Location: [place];  Service: Neurosurgery;  Laterality: N/A;   SHOULDER ARTHROSCOPY WITH ROTATOR CUFF REPAIR Right 01/05/2013   Procedure: SHOULDER ARTHROSCOPY WITH ARTHROSCOPIC  ROTATOR CUFF REPAIR, ACROMIOPLASTY, EXTENSIVE DEBRIDEMENT;  Surgeon: Johnny Bridge, MD;  Location: [place];  Service: Orthopedics;  Laterality: Right;   TEE WITHOUT CARDIOVERSION N/A 09/22/2017   Procedure: TRANSESOPHAGEAL ECHOCARDIOGRAM (TEE);  Surgeon: Sanda Klein, MD;  [facility];  Service: Cardiovascular;  Laterality: N/A;   TONSILLECTOMY     TUBAL LIGATION      Allergies  Allergen Reactions   Fluticasone Other (See  Comments)    DRY EYES   Aciphex [Rabeprazole]     Critical    Codeine Nausea Only   Codeine    Cymbalta [Duloxetine Hcl]     Critical    Lexapro [Escitalopram]     critical   Lipitor [Atorvastatin Calcium]     Intolerant    Pneumovax [Pneumococcal Polysaccharide Vaccine]     moderate   Prevacid [Lansoprazole]     Unknown, listed on MAR   Rosuvastatin     Intolerant    Sertraline     critical   Statins     Critical    Sulfa Antibiotics     vomiting   Tetracyclines & Related     Morphine Sulfate   Welchol [Colesevelam]     Unknown, listed on MAR   Pneumovax 23 [Pneumococcal Vac Polyvalent] Rash    Injection site reaction    Outpatient Encounter Medications as of 07/19/2022  Medication Sig   acetaminophen (TYLENOL) 325 MG tablet Take 650 mg by mouth See admin instructions. Take 650 mg 3 times daily, may take 650 mg 2 times daily as needed for pain   aspirin EC 81 MG tablet Take 81 mg by mouth daily. Swallow whole.   Calcium Carb-Cholecalciferol (CALCIUM 500 + D PO) Take 1 tablet by mouth daily.   calcium carbonate (TUMS - DOSED IN MG ELEMENTAL CALCIUM) 500 MG chewable tablet Chew 2 tablets by mouth 2 (two) times daily as needed for indigestion or heartburn.   clonazePAM (KLONOPIN) 0.5 MG tablet Take 0.25 mg by mouth 2 (two) times daily as needed (for anxiety and shakiness).   clopidogrel (PLAVIX) 75 MG tablet Take 1 tablet (75 mg total) by mouth daily.   diclofenac Sodium (VOLTAREN) 1 % GEL Apply topically 2 (two) times daily as needed.   donepezil (ARICEPT) 10 MG tablet Take 1 tablet (10 mg total) by mouth daily.   gabapentin (NEURONTIN) 100 MG capsule Take 200 mg by mouth 2 (two) times daily.   gabapentin (NEURONTIN) 100 MG capsule Take 200 mg by mouth. Take in morning   levothyroxine (SYNTHROID) 88 MCG tablet Take 88 mcg by mouth daily before breakfast.   melatonin 5 MG TABS Take 5 mg by mouth at bedtime as needed (sleep).   mirabegron ER (MYRBETRIQ) 25 MG TB24 tablet  Take 1 tablet (25 mg total) by mouth daily.   Multiple Vitamins-Minerals (PRESERVISION AREDS PO) Take 1 tablet by mouth 2 (two) times daily.    omeprazole (PRILOSEC) 40 MG capsule Take 40 mg by mouth daily.   propranolol (INDERAL) 40 MG tablet Take 40 mg by mouth 2 (two) times daily.   rosuvastatin (CRESTOR) 5 MG tablet Take 1 tablet (5 mg total) by mouth daily.   No facility-administered encounter medications on file as  of 07/19/2022.    Review of Systems:  Review of Systems  Constitutional:  Negative for activity change, appetite change, chills, diaphoresis, fatigue, fever and unexpected weight change.  HENT:  Negative for congestion.   Respiratory:  Negative for cough, shortness of breath and wheezing.   Cardiovascular:  Negative for chest pain, palpitations and leg swelling.  Gastrointestinal:  Negative for abdominal distention, abdominal pain, constipation and diarrhea.  Genitourinary:  Negative for difficulty urinating and dysuria.  Musculoskeletal:  Positive for back pain and gait problem. Negative for arthralgias, joint swelling and myalgias.  Neurological:  Positive for numbness (to tongue and face resolved.). Negative for dizziness, tremors, seizures, syncope, facial asymmetry, speech difficulty, weakness, light-headedness and headaches.  Psychiatric/Behavioral:  Positive for confusion. Negative for agitation and behavioral problems. The patient is nervous/anxious.     Health Maintenance  Topic Date Due   COVID-19 Vaccine (4 - Janssen risk series) 09/30/2021   TETANUS/TDAP  08/18/2022   Pneumonia Vaccine 72+ Years old  Completed   INFLUENZA VACCINE  Completed   DEXA SCAN  Completed   Zoster Vaccines- Shingrix  Completed   HPV VACCINES  Aged Out    Physical Exam: Vitals:   07/19/22 1358  BP: 124/84  Pulse: 70  Resp: 17  Temp: 98.1 F (36.7 C)  TempSrc: Temporal  SpO2: 95%  Weight: 148 lb 9.6 oz (67.4 kg)  Height: '5\' 3"'$  (1.6 m)   Body mass index is 26.32  kg/m. Physical Exam Vitals and nursing note reviewed.  Constitutional:      General: She is not in acute distress.    Appearance: She is not diaphoretic.  HENT:     Head: Normocephalic and atraumatic.     Mouth/Throat:     Mouth: Mucous membranes are moist.     Pharynx: Oropharynx is clear.  Eyes:     Extraocular Movements: Extraocular movements intact.     Conjunctiva/sclera: Conjunctivae normal.     Pupils: Pupils are equal, round, and reactive to light.  Neck:     Vascular: No JVD.  Cardiovascular:     Rate and Rhythm: Normal rate and regular rhythm.     Heart sounds: No murmur heard. Pulmonary:     Effort: Pulmonary effort is normal. No respiratory distress.     Breath sounds: Normal breath sounds. No wheezing.  Skin:    General: Skin is warm and dry.  Neurological:     General: No focal deficit present.     Mental Status: She is alert. Mental status is at baseline.     Cranial Nerves: No cranial nerve deficit.     Motor: No weakness.     Labs reviewed: Basic Metabolic Panel: Recent Labs    07/24/21 1533 07/30/21 0000 08/06/21 0000 09/15/21 0000 03/23/22 0000  NA 133*   < > 143 143 142  K 3.3*   < > 4.2 4.1 4.3  CL 101   < > 107 106 105  CO2 24   < > 26* 27* 24*  GLUCOSE 151*  --   --   --   --   BUN 9   < > '11 11 12  '$ CREATININE 0.67   < > 0.7 0.7 0.7  CALCIUM 8.6*   < > 9.1 9.3 9.4  TSH  --   --   --   --  1.55   < > = values in this interval not displayed.   Liver Function Tests: Recent Labs    07/24/21 1533  AST 21  ALT 15  ALKPHOS 84  BILITOT 0.7  PROT 6.6  ALBUMIN 3.5   No results for input(s): "LIPASE", "AMYLASE" in the last 8760 hours. No results for input(s): "AMMONIA" in the last 8760 hours. CBC: Recent Labs    07/24/21 1533 03/22/22 0000  WBC 8.0 5.9  HGB 13.0 13.6  HCT 39.9 41  MCV 97.1  --   PLT 292 238   Lipid Panel: Recent Labs    03/23/22 0000  CHOL 174  HDL 66  LDLCALC 81  TRIG 134   Lab Results  Component  Value Date   HGBA1C 5.6 09/21/2017    Procedures since last visit: No results found.  Assessment/Plan  1. Anxiety Try Buspar 10 mg bid F/U 6 weeks   2. Facial numbness Resolved. NO focal symptoms.  ? TIA vs anxiety. Would lean more towards anxiety given hx   Discussed plan of care with her son Dr Geralynn Ochs. Has had anxiety for years. Many meds listed allergies which we are not sure if they are true allergies. Will try Buspar. If signs of CVA exhibit would need ED eval.   Labs/tests ordered:  * No order type specified * CMP CBC  Next appt:  08/03/2022

## 2022-07-20 DIAGNOSIS — G459 Transient cerebral ischemic attack, unspecified: Secondary | ICD-10-CM | POA: Diagnosis not present

## 2022-07-20 LAB — BASIC METABOLIC PANEL
BUN: 11 (ref 4–21)
CO2: 26 — AB (ref 13–22)
Chloride: 106 (ref 99–108)
Creatinine: 0.7 (ref 0.5–1.1)
Glucose: 100
Potassium: 4.2 mEq/L (ref 3.5–5.1)
Sodium: 142 (ref 137–147)

## 2022-07-20 LAB — CBC AND DIFFERENTIAL
HCT: 42 (ref 36–46)
Hemoglobin: 14.5 (ref 12.0–16.0)
Platelets: 224 10*3/uL (ref 150–400)
WBC: 5.8

## 2022-07-20 LAB — HEPATIC FUNCTION PANEL
ALT: 20 U/L (ref 7–35)
AST: 20 (ref 13–35)
Alkaline Phosphatase: 84 (ref 25–125)
Bilirubin, Total: 0.4

## 2022-07-20 LAB — COMPREHENSIVE METABOLIC PANEL
Albumin: 3.9 (ref 3.5–5.0)
Calcium: 9.3 (ref 8.7–10.7)
Globulin: 2.4
eGFR: 88

## 2022-07-20 LAB — CBC: RBC: 4.41 (ref 3.87–5.11)

## 2022-07-26 ENCOUNTER — Encounter: Payer: Self-pay | Admitting: Internal Medicine

## 2022-07-27 ENCOUNTER — Encounter: Payer: Self-pay | Admitting: Cardiovascular Disease

## 2022-07-28 ENCOUNTER — Encounter: Payer: Medicare Other | Admitting: Internal Medicine

## 2022-08-03 ENCOUNTER — Non-Acute Institutional Stay: Payer: Medicare Other | Admitting: Internal Medicine

## 2022-08-03 ENCOUNTER — Encounter: Payer: Self-pay | Admitting: Internal Medicine

## 2022-08-03 VITALS — BP 132/74 | HR 60 | Temp 97.6°F | Resp 17 | Ht 63.0 in | Wt 147.8 lb

## 2022-08-03 DIAGNOSIS — E039 Hypothyroidism, unspecified: Secondary | ICD-10-CM | POA: Diagnosis not present

## 2022-08-03 DIAGNOSIS — M5442 Lumbago with sciatica, left side: Secondary | ICD-10-CM | POA: Diagnosis not present

## 2022-08-03 DIAGNOSIS — R42 Dizziness and giddiness: Secondary | ICD-10-CM

## 2022-08-03 DIAGNOSIS — F419 Anxiety disorder, unspecified: Secondary | ICD-10-CM | POA: Diagnosis not present

## 2022-08-03 DIAGNOSIS — E782 Mixed hyperlipidemia: Secondary | ICD-10-CM

## 2022-08-03 DIAGNOSIS — G8929 Other chronic pain: Secondary | ICD-10-CM

## 2022-08-03 DIAGNOSIS — I10 Essential (primary) hypertension: Secondary | ICD-10-CM

## 2022-08-03 DIAGNOSIS — N3946 Mixed incontinence: Secondary | ICD-10-CM | POA: Diagnosis not present

## 2022-08-03 DIAGNOSIS — M5441 Lumbago with sciatica, right side: Secondary | ICD-10-CM | POA: Diagnosis not present

## 2022-08-03 DIAGNOSIS — R200 Anesthesia of skin: Secondary | ICD-10-CM | POA: Diagnosis not present

## 2022-08-03 DIAGNOSIS — F0150 Vascular dementia without behavioral disturbance: Secondary | ICD-10-CM | POA: Diagnosis not present

## 2022-08-03 NOTE — Progress Notes (Unsigned)
Location:  [place] of Service:  Clinic (12)  Provider:   Code Status:  Goals of Care:     08/03/2022   10:51 AM  Advanced Directives  Does Patient Have a Medical Advance Directive? Yes  Type of Paramedic of [place];Living will;Out of facility DNR (pink MOST or yellow form)  Does patient want to make changes to medical advance directive? No - Guardian declined  Copy of [place] in Chart? Yes - validated most recent copy scanned in chart (See row information)     Chief Complaint  Patient presents with   Medical Management of Chronic Issues    Follow up visit   Immunizations    Discussed the need for Covid 19 vaccine    HPI: Patient is a 81 y.o. female seen today for medical management of chronic diseases.    Lives in [place] with her husband  has h/o  Vascular dementia S/p CVA in 2018 and Repeat MRI has shown Chronic Vessel Changes Essential Tremor, Anxiety, Brachial Plexux Palsy, Chronic Pain in her Left Knee, Hypothyroidism, Hyperlipidemia,  S/P L4-5, L5-S1 decompressive laminectomy with foraminotomies due to Severe Back pain Done on 06/08/21    Acute issue today was feeling dizzy and Facial Numbness She keep saying like my head is swimming and fogging Things moving around Obvious discomfort with change in position Was seen by Alyse Low 2 weeks ago started [employer] [facility] and Labs done  Thought to be Anxiety Patient had same symptoms in 2019 and was diagnosed with Vertigo  MRI at that time was negative Low back pain  Constant issue Wants to know if she can use tylenol  Per Nurses notes Continuous to have issues with her hygiene and Urinary incontinence   Past Medical History:  Diagnosis Date   Anxiety    Arthritis    Biceps tendonitis 01/05/2013   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 Street of back injections by Dr. Brien Few 2016 with good relief, history of cervical DD with possiblity of surgery by Dr. Trenton Gammon.   GERD (gastroesophageal reflux disease)    IBS with moderate refluc seen on upper GI study form January 2011 Dr. Watt Climes   HTN (hypertension)    no meds   Hyperlipidemia    Hypothyroidism    IBS (irritable bowel syndrome)    Imbalance    Impingement syndrome of right shoulder 01/05/2013   Mass of left side of neck    Memory loss    Mood disorder (HCC)    MVP (mitral valve prolapse)    Hx of    Osteopenia    Peripheral edema    Right rotator cuff tear 01/05/2013   Sciatic pain    recurrent   Seasonal allergies    Stroke [facility])    Tension headache    TIA (transient ischemic attack)    Tremor     Past Surgical History:  Procedure Laterality Date   ANTERIOR CERVICAL DECOMP/DISCECTOMY FUSION  04/2015    Dr. Pamala Hurry, [place]  2004   which revealed smooth and normal coronary arteries   CARPAL TUNNEL RELEASE  2000   lt   CATARACT EXTRACTION W/ INTRAOCULAR LENS  IMPLANT, BILATERAL  1/17   Dr. Lucita Ferrara  COSMETIC SURGERY  2010   eyes-facial   DENTAL SURGERY  1960   LASIK     LOOP RECORDER INSERTION N/A 09/22/2017   Procedure: LOOP RECORDER INSERTION;  Surgeon: Constance Haw, MD;  Location: [place] CV LAB;  Service: Cardiovascular;  Laterality: N/A;   LUMBAR LAMINECTOMY/DECOMPRESSION MICRODISCECTOMY N/A 06/08/2021   Procedure: Laminectomy and Foraminotomy - L4-L5, L5-S1;  Surgeon: Earnie Larsson, MD;  Location: [place];  Service: Neurosurgery;  Laterality: N/A;   SHOULDER ARTHROSCOPY WITH ROTATOR CUFF REPAIR Right 01/05/2013   Procedure: SHOULDER ARTHROSCOPY WITH ARTHROSCOPIC  ROTATOR CUFF REPAIR, ACROMIOPLASTY, EXTENSIVE DEBRIDEMENT;  Surgeon: Johnny Bridge, MD;  Location: [place];  Service: Orthopedics;  Laterality: Right;    TEE WITHOUT CARDIOVERSION N/A 09/22/2017   Procedure: TRANSESOPHAGEAL ECHOCARDIOGRAM (TEE);  Surgeon: Sanda Klein, MD;  [facility];  Service: Cardiovascular;  Laterality: N/A;   TONSILLECTOMY     TUBAL LIGATION      Allergies  Allergen Reactions   Fluticasone Other (See Comments)    DRY EYES   Aciphex [Rabeprazole]     Critical    Codeine Nausea Only   Codeine    Cymbalta [Duloxetine Hcl]     Critical    Lexapro [Escitalopram]     critical   Lipitor [Atorvastatin Calcium]     Intolerant    Pneumovax [Pneumococcal Polysaccharide Vaccine]     moderate   Prevacid [Lansoprazole]     Unknown, listed on MAR   Rosuvastatin     Intolerant    Sertraline     critical   Statins     Critical    Sulfa Antibiotics     vomiting   Tetracyclines & Related     Morphine Sulfate   Welchol [Colesevelam]     Unknown, listed on MAR   Pneumovax 23 [Pneumococcal Vac Polyvalent] Rash    Injection site reaction    Outpatient Encounter Medications as of 08/03/2022  Medication Sig   acetaminophen (TYLENOL) 325 MG tablet Take 650 mg by mouth See admin instructions. Take 650 mg 3 times daily, may take 650 mg 2 times daily as needed for pain   busPIRone (BUSPAR) 10 MG tablet Take 1 tablet (10 mg total) by mouth 2 (two) times daily.   Calcium Carb-Cholecalciferol (CALCIUM 500 + D PO) Take 1 tablet by mouth daily.   calcium carbonate (TUMS - DOSED IN MG ELEMENTAL CALCIUM) 500 MG chewable tablet Chew 2 tablets by mouth 2 (two) times daily as needed for indigestion or heartburn.   clonazePAM (KLONOPIN) 0.5 MG tablet Take 0.25 mg by mouth 2 (two) times daily as needed (for anxiety and shakiness).   clopidogrel (PLAVIX) 75 MG tablet Take 1 tablet (75 mg total) by mouth daily.   diclofenac Sodium (VOLTAREN) 1 % GEL Apply topically 2 (two) times daily as needed.   donepezil (ARICEPT) 10 MG tablet Take 1 tablet (10 mg total) by mouth daily.   gabapentin (NEURONTIN) 100 MG capsule Take  200 mg by mouth 2 (two) times daily.   gabapentin (NEURONTIN) 100 MG capsule Take 200 mg by mouth. Take in morning   levothyroxine (SYNTHROID) 88 MCG tablet Take 88 mcg by mouth daily before breakfast.   melatonin 5 MG TABS Take 5 mg by mouth at bedtime as needed (sleep).   mirabegron ER (MYRBETRIQ) 25 MG TB24 tablet Take 1 tablet (25 mg total) by mouth daily.   Multiple Vitamins-Minerals (PRESERVISION AREDS PO) Take 1 tablet by mouth 2 (two) times daily.  omeprazole (PRILOSEC) 40 MG capsule Take 40 mg by mouth daily.   propranolol (INDERAL) 40 MG tablet Take 40 mg by mouth 2 (two) times daily.   rosuvastatin (CRESTOR) 5 MG tablet Take 1 tablet (5 mg total) by mouth daily.   [DISCONTINUED] aspirin EC 81 MG tablet Take 81 mg by mouth daily. Swallow whole.   No facility-administered encounter medications on file as of 08/03/2022.    Review of Systems:  Review of Systems  Constitutional:  Positive for activity change. Negative for appetite change.  HENT: Negative.    Respiratory:  Negative for cough and shortness of breath.   Cardiovascular:  Negative for leg swelling.  Gastrointestinal:  Negative for constipation.  Genitourinary:  Positive for urgency.  Musculoskeletal:  Positive for back pain. Negative for arthralgias, gait problem and myalgias.  Skin: Negative.   Neurological:  Negative for dizziness and weakness.  Psychiatric/Behavioral:  Positive for confusion and dysphoric mood. Negative for sleep disturbance.     Health Maintenance  Topic Date Due   COVID-19 Vaccine (4 - Janssen risk series) 09/30/2021   TETANUS/TDAP  08/18/2022   Medicare Annual Wellness (AWV)  02/05/2023   Pneumonia Vaccine 68+ Years old  Completed   INFLUENZA VACCINE  Completed   DEXA SCAN  Completed   Zoster Vaccines- Shingrix  Completed   HPV VACCINES  Aged Out    Physical Exam: Vitals:   08/03/22 1046  BP: 132/74  Pulse: 60  Resp: 17  Temp: 97.6 F (36.4 C)  TempSrc: Temporal  SpO2: 97%   Weight: 147 lb 12.8 oz (67 kg)  Height: '5\' 3"'$  (1.6 m)   Body mass index is 26.18 kg/m. Physical Exam Vitals reviewed.  Constitutional:      Appearance: Normal appearance.  HENT:     Head: Normocephalic.     Nose: Nose normal.     Mouth/Throat:     Mouth: Mucous membranes are moist.     Pharynx: Oropharynx is clear.  Eyes:     Pupils: Pupils are equal, round, and reactive to light.  Cardiovascular:     Rate and Rhythm: Normal rate and regular rhythm.     Pulses: Normal pulses.     Heart sounds: Normal heart sounds. No murmur heard. Pulmonary:     Effort: Pulmonary effort is normal.     Breath sounds: Normal breath sounds.  Abdominal:     General: Abdomen is flat. Bowel sounds are normal.     Palpations: Abdomen is soft.  Musculoskeletal:        General: No swelling.     Cervical back: Neck supple.  Skin:    General: Skin is warm.  Neurological:     General: No focal deficit present.     Mental Status: She is alert.     Comments: Mild Nystagmus More on Right side present Patient Got Dizzy with Moving her head.and with Position change  Psychiatric:        Mood and Affect: Mood normal.        Thought Content: Thought content normal.    Labs reviewed: Basic Metabolic Panel: Recent Labs    09/15/21 0000 03/23/22 0000 07/20/22 0000  NA 143 142 142  K 4.1 4.3 4.2  CL 106 105 106  CO2 27* 24* 26*  BUN '11 12 11  '$ CREATININE 0.7 0.7 0.7  CALCIUM 9.3 9.4 9.3  TSH  --  1.55  --    Liver Function Tests: Recent Labs    07/20/22 0000  AST 20  ALT 20  ALKPHOS 84  ALBUMIN 3.9   No results for input(s): "LIPASE", "AMYLASE" in the last 8760 hours. No results for input(s): "AMMONIA" in the last 8760 hours. CBC: Recent Labs    03/22/22 0000 07/20/22 0000  WBC 5.9 5.8  HGB 13.6 14.5  HCT 41 42  PLT 238 224   Lipid Panel: Recent Labs    03/23/22 0000  CHOL 174  HDL 66  LDLCALC 81  TRIG 134   Lab Results  Component Value Date   HGBA1C 5.6 09/21/2017     Procedures since last visit: No results found.  Assessment/Plan 1. Vertigo Most likely Cause of her dizziness Will start her on Meclizine 12.5 mg BID Follow in 2 weeks Labs done were all normal 2. Facial numbness It is bilateral She had c/o this before with vertigo Will Follow  3. Chronic bilateral low back pain with bilateral sciatica Schedule Tylenol 650 mg BID instead of PRn Had failed Lidocaine patches and Refused them also Per Dr Trenton Gammon Conservative management  Also on Neurontin Does not tolerate higher doses 4. Vascular dementia without behavioral disturbance ([place]) MMSE recent 19/30 On Aricept and Namenda Will not change her dose as she is feeling dizzy Per staff struggles with keeping Personal Hygeine 5. Essential hypertension Controlled With inderal though she takes it for Tremor  6. Mixed hyperlipidemia On statin LDL 81 in 06/23  7. Mixed stress and urge urinary incontinence Will increase her Myrbetriq next visit  8. Acquired hypothyroidism TSH normal in  9. Anxiety [place] Bispar to 5 mg BID to decras    Labs/tests ordered:  * No order type specified * Next appt:  08/17/2022

## 2022-08-05 ENCOUNTER — Other Ambulatory Visit: Payer: Self-pay | Admitting: Adult Health

## 2022-08-05 MED ORDER — CLONAZEPAM 0.5 MG PO TABS: 0.2500 mg | ORAL_TABLET | Freq: Two times a day (BID) | ORAL | 3 refills | Status: DC | PRN

## 2022-08-07 ENCOUNTER — Other Ambulatory Visit: Payer: Self-pay

## 2022-08-07 ENCOUNTER — Inpatient Hospital Stay (HOSPITAL_COMMUNITY)
Admission: EM | Admit: 2022-08-07 | Discharge: 2022-08-09 | DRG: 312 | Disposition: A | Discharge status: Skilled Nursing Facility | Payer: Medicare Other | Source: Skilled Nursing Facility | Attending: Internal Medicine | Admitting: Internal Medicine

## 2022-08-07 ENCOUNTER — Emergency Department (HOSPITAL_COMMUNITY): Payer: Medicare Other

## 2022-08-07 ENCOUNTER — Encounter (HOSPITAL_COMMUNITY): Payer: Self-pay | Admitting: Emergency Medicine

## 2022-08-07 ENCOUNTER — Observation Stay (HOSPITAL_COMMUNITY): Payer: Medicare Other

## 2022-08-07 DIAGNOSIS — Z7401 Bed confinement status: Secondary | ICD-10-CM | POA: Diagnosis not present

## 2022-08-07 DIAGNOSIS — Z8052 Family history of malignant neoplasm of bladder: Secondary | ICD-10-CM

## 2022-08-07 DIAGNOSIS — E039 Hypothyroidism, unspecified: Secondary | ICD-10-CM | POA: Diagnosis not present

## 2022-08-07 DIAGNOSIS — R253 Fasciculation: Secondary | ICD-10-CM | POA: Diagnosis not present

## 2022-08-07 DIAGNOSIS — I341 Nonrheumatic mitral (valve) prolapse: Secondary | ICD-10-CM | POA: Diagnosis present

## 2022-08-07 DIAGNOSIS — R531 Weakness: Secondary | ICD-10-CM | POA: Diagnosis not present

## 2022-08-07 DIAGNOSIS — Z66 Do not resuscitate: Secondary | ICD-10-CM | POA: Diagnosis not present

## 2022-08-07 DIAGNOSIS — E785 Hyperlipidemia, unspecified: Secondary | ICD-10-CM | POA: Diagnosis present

## 2022-08-07 DIAGNOSIS — F0154 Vascular dementia, unspecified severity, with anxiety: Secondary | ICD-10-CM | POA: Diagnosis present

## 2022-08-07 DIAGNOSIS — R410 Disorientation, unspecified: Secondary | ICD-10-CM | POA: Diagnosis not present

## 2022-08-07 DIAGNOSIS — Z8673 Personal history of transient ischemic attack (TIA), and cerebral infarction without residual deficits: Secondary | ICD-10-CM

## 2022-08-07 DIAGNOSIS — F0150 Vascular dementia without behavioral disturbance: Secondary | ICD-10-CM | POA: Diagnosis not present

## 2022-08-07 DIAGNOSIS — Z887 Allergy status to serum and vaccine status: Secondary | ICD-10-CM

## 2022-08-07 DIAGNOSIS — M8580 Other specified disorders of bone density and structure, unspecified site: Secondary | ICD-10-CM | POA: Diagnosis present

## 2022-08-07 DIAGNOSIS — R0602 Shortness of breath: Secondary | ICD-10-CM | POA: Diagnosis not present

## 2022-08-07 DIAGNOSIS — M5030 Other cervical disc degeneration, unspecified cervical region: Secondary | ICD-10-CM | POA: Diagnosis present

## 2022-08-07 DIAGNOSIS — I959 Hypotension, unspecified: Secondary | ICD-10-CM | POA: Diagnosis not present

## 2022-08-07 DIAGNOSIS — I10 Essential (primary) hypertension: Secondary | ICD-10-CM | POA: Diagnosis present

## 2022-08-07 DIAGNOSIS — Z87891 Personal history of nicotine dependence: Secondary | ICD-10-CM | POA: Diagnosis not present

## 2022-08-07 DIAGNOSIS — F419 Anxiety disorder, unspecified: Secondary | ICD-10-CM | POA: Diagnosis not present

## 2022-08-07 DIAGNOSIS — M1990 Unspecified osteoarthritis, unspecified site: Secondary | ICD-10-CM | POA: Diagnosis present

## 2022-08-07 DIAGNOSIS — K219 Gastro-esophageal reflux disease without esophagitis: Secondary | ICD-10-CM | POA: Diagnosis present

## 2022-08-07 DIAGNOSIS — F01A4 Vascular dementia, mild, with anxiety: Secondary | ICD-10-CM | POA: Diagnosis present

## 2022-08-07 DIAGNOSIS — Z1152 Encounter for screening for COVID-19: Secondary | ICD-10-CM | POA: Diagnosis not present

## 2022-08-07 DIAGNOSIS — Z885 Allergy status to narcotic agent status: Secondary | ICD-10-CM | POA: Diagnosis not present

## 2022-08-07 DIAGNOSIS — R42 Dizziness and giddiness: Secondary | ICD-10-CM | POA: Diagnosis not present

## 2022-08-07 DIAGNOSIS — G250 Essential tremor: Secondary | ICD-10-CM | POA: Diagnosis present

## 2022-08-07 DIAGNOSIS — Z79890 Hormone replacement therapy (postmenopausal): Secondary | ICD-10-CM

## 2022-08-07 DIAGNOSIS — I2510 Atherosclerotic heart disease of native coronary artery without angina pectoris: Secondary | ICD-10-CM | POA: Diagnosis not present

## 2022-08-07 DIAGNOSIS — Z7902 Long term (current) use of antithrombotics/antiplatelets: Secondary | ICD-10-CM | POA: Diagnosis not present

## 2022-08-07 DIAGNOSIS — G319 Degenerative disease of nervous system, unspecified: Secondary | ICD-10-CM | POA: Diagnosis not present

## 2022-08-07 DIAGNOSIS — Z888 Allergy status to other drugs, medicaments and biological substances status: Secondary | ICD-10-CM | POA: Diagnosis not present

## 2022-08-07 DIAGNOSIS — I2584 Coronary atherosclerosis due to calcified coronary lesion: Secondary | ICD-10-CM

## 2022-08-07 DIAGNOSIS — Z981 Arthrodesis status: Secondary | ICD-10-CM

## 2022-08-07 DIAGNOSIS — I951 Orthostatic hypotension: Secondary | ICD-10-CM | POA: Diagnosis present

## 2022-08-07 DIAGNOSIS — R079 Chest pain, unspecified: Secondary | ICD-10-CM | POA: Diagnosis not present

## 2022-08-07 DIAGNOSIS — Z882 Allergy status to sulfonamides status: Secondary | ICD-10-CM | POA: Diagnosis not present

## 2022-08-07 DIAGNOSIS — E782 Mixed hyperlipidemia: Secondary | ICD-10-CM | POA: Diagnosis not present

## 2022-08-07 DIAGNOSIS — R55 Syncope and collapse: Secondary | ICD-10-CM | POA: Diagnosis not present

## 2022-08-07 DIAGNOSIS — Z79899 Other long term (current) drug therapy: Secondary | ICD-10-CM | POA: Diagnosis not present

## 2022-08-07 DIAGNOSIS — R251 Tremor, unspecified: Secondary | ICD-10-CM | POA: Diagnosis present

## 2022-08-07 LAB — CBC WITH DIFFERENTIAL/PLATELET
Abs Immature Granulocytes: 0.03 10*3/uL (ref 0.00–0.07)
Basophils Absolute: 0 10*3/uL (ref 0.0–0.1)
Basophils Relative: 0 %
Eosinophils Absolute: 0.2 10*3/uL (ref 0.0–0.5)
Eosinophils Relative: 2 %
HCT: 43.4 % (ref 36.0–46.0)
Hemoglobin: 14.1 g/dL (ref 12.0–15.0)
Immature Granulocytes: 0 %
Lymphocytes Relative: 13 %
Lymphs Abs: 1.3 10*3/uL (ref 0.7–4.0)
MCH: 32.1 pg (ref 26.0–34.0)
MCHC: 32.5 g/dL (ref 30.0–36.0)
MCV: 98.9 fL (ref 80.0–100.0)
Monocytes Absolute: 0.8 10*3/uL (ref 0.1–1.0)
Monocytes Relative: 9 %
Neutro Abs: 7.5 10*3/uL (ref 1.7–7.7)
Neutrophils Relative %: 76 %
Platelets: 301 10*3/uL (ref 150–400)
RBC: 4.39 MIL/uL (ref 3.87–5.11)
RDW: 13.1 % (ref 11.5–15.5)
WBC: 9.9 10*3/uL (ref 4.0–10.5)
nRBC: 0 % (ref 0.0–0.2)

## 2022-08-07 LAB — BASIC METABOLIC PANEL
Anion gap: 8 (ref 5–15)
BUN: 14 mg/dL (ref 8–23)
CO2: 29 mmol/L (ref 22–32)
Calcium: 9.2 mg/dL (ref 8.9–10.3)
Chloride: 104 mmol/L (ref 98–111)
Creatinine, Ser: 0.95 mg/dL (ref 0.44–1.00)
GFR, Estimated: 60 mL/min — ABNORMAL LOW (ref >60)
Glucose, Bld: 99 mg/dL (ref 70–99)
Potassium: 4.2 mmol/L (ref 3.5–5.1)
Sodium: 141 mmol/L (ref 135–145)

## 2022-08-07 LAB — TROPONIN I (HIGH SENSITIVITY)
Troponin I (High Sensitivity): 4 ng/L (ref <18)
Troponin I (High Sensitivity): 5 ng/L (ref <18)

## 2022-08-07 LAB — I-STAT VENOUS BLOOD GAS, ED
Acid-Base Excess: 0 mmol/L (ref 0.0–2.0)
Bicarbonate: 23.3 mmol/L (ref 20.0–28.0)
Calcium, Ion: 1.06 mmol/L — ABNORMAL LOW (ref 1.15–1.40)
HCT: 37 % (ref 36.0–46.0)
Hemoglobin: 12.6 g/dL (ref 12.0–15.0)
O2 Saturation: 87 %
Potassium: 3.5 mmol/L (ref 3.5–5.1)
Sodium: 142 mmol/L (ref 135–145)
TCO2: 24 mmol/L (ref 22–32)
pCO2, Ven: 33 mmHg — ABNORMAL LOW (ref 44–60)
pH, Ven: 7.457 — ABNORMAL HIGH (ref 7.25–7.43)
pO2, Ven: 49 mmHg — ABNORMAL HIGH (ref 32–45)

## 2022-08-07 LAB — HEPATIC FUNCTION PANEL
ALT: 14 U/L (ref 0–44)
AST: 20 U/L (ref 15–41)
Albumin: 3.6 g/dL (ref 3.5–5.0)
Alkaline Phosphatase: 69 U/L (ref 38–126)
Bilirubin, Direct: <0.1 mg/dL (ref 0.0–0.2)
Total Bilirubin: 0.5 mg/dL (ref 0.3–1.2)
Total Protein: 6.8 g/dL (ref 6.5–8.1)

## 2022-08-07 LAB — URINALYSIS, COMPLETE (UACMP) WITH MICROSCOPIC
Bacteria, UA: NONE SEEN
Bilirubin Urine: NEGATIVE
Glucose, UA: NEGATIVE mg/dL
Hgb urine dipstick: NEGATIVE
Ketones, ur: NEGATIVE mg/dL
Leukocytes,Ua: NEGATIVE
Nitrite: NEGATIVE
Protein, ur: NEGATIVE mg/dL
Specific Gravity, Urine: >1.046 — ABNORMAL HIGH (ref 1.005–1.030)
pH: 7 (ref 5.0–8.0)

## 2022-08-07 LAB — CK: Total CK: 38 U/L (ref 38–234)

## 2022-08-07 LAB — PROCALCITONIN: Procalcitonin: <0.1 ng/mL

## 2022-08-07 LAB — LACTIC ACID, PLASMA: Lactic Acid, Venous: 0.9 mmol/L (ref 0.5–1.9)

## 2022-08-07 LAB — MAGNESIUM: Magnesium: 2.2 mg/dL (ref 1.7–2.4)

## 2022-08-07 LAB — SARS CORONAVIRUS 2 BY RT PCR: SARS Coronavirus 2 by RT PCR: NEGATIVE

## 2022-08-07 LAB — BRAIN NATRIURETIC PEPTIDE: B Natriuretic Peptide: 78 pg/mL (ref 0.0–100.0)

## 2022-08-07 LAB — PHOSPHORUS: Phosphorus: 4 mg/dL (ref 2.5–4.6)

## 2022-08-07 LAB — TSH: TSH: 1.147 u[IU]/mL (ref 0.350–4.500)

## 2022-08-07 MED ORDER — DONEPEZIL HCL 10 MG PO TABS
10.0000 mg | ORAL_TABLET | Freq: Every day | ORAL | Status: DC
  Administered 2022-08-07 – 2022-08-09 (×2): 10 mg via ORAL
  Filled 2022-08-07 (×3): qty 1

## 2022-08-07 MED ORDER — LEVOTHYROXINE SODIUM 88 MCG PO TABS
88.0000 ug | ORAL_TABLET | Freq: Every day | ORAL | Status: DC
  Administered 2022-08-08 – 2022-08-09 (×2): 88 ug via ORAL
  Filled 2022-08-07 (×3): qty 1

## 2022-08-07 MED ORDER — MELATONIN 3 MG PO TABS
3.0000 mg | ORAL_TABLET | Freq: Every day | ORAL | Status: DC
  Administered 2022-08-08 (×2): 3 mg via ORAL
  Filled 2022-08-07 (×2): qty 1

## 2022-08-07 MED ORDER — SODIUM CHLORIDE 0.9 % IV BOLUS
500.0000 mL | Freq: Once | INTRAVENOUS | Status: AC
  Administered 2022-08-07: 500 mL via INTRAVENOUS

## 2022-08-07 MED ORDER — IOHEXOL 350 MG/ML SOLN
75.0000 mL | Freq: Once | INTRAVENOUS | Status: AC | PRN
  Administered 2022-08-07: 75 mL via INTRAVENOUS

## 2022-08-07 MED ORDER — CLOPIDOGREL BISULFATE 75 MG PO TABS
75.0000 mg | ORAL_TABLET | Freq: Every day | ORAL | Status: DC
  Administered 2022-08-08 – 2022-08-09 (×2): 75 mg via ORAL
  Filled 2022-08-07 (×2): qty 1

## 2022-08-07 MED ORDER — ROSUVASTATIN CALCIUM 5 MG PO TABS
5.0000 mg | ORAL_TABLET | Freq: Every day | ORAL | Status: DC
  Administered 2022-08-07 – 2022-08-09 (×3): 5 mg via ORAL
  Filled 2022-08-07 (×3): qty 1

## 2022-08-07 MED ORDER — ACETAMINOPHEN 325 MG PO TABS
650.0000 mg | ORAL_TABLET | Freq: Four times a day (QID) | ORAL | Status: DC | PRN
  Administered 2022-08-09: 650 mg via ORAL
  Filled 2022-08-07: qty 2

## 2022-08-07 MED ORDER — SODIUM CHLORIDE 0.9 % IV SOLN: INTRAVENOUS | Status: DC

## 2022-08-07 MED ORDER — HYDROCODONE-ACETAMINOPHEN 5-325 MG PO TABS: 1.0000 | ORAL_TABLET | ORAL | Status: DC | PRN

## 2022-08-07 MED ORDER — ACETAMINOPHEN 650 MG RE SUPP: 650.0000 mg | Freq: Four times a day (QID) | RECTAL | Status: DC | PRN

## 2022-08-07 MED ORDER — BUSPIRONE HCL 5 MG PO TABS
10.0000 mg | ORAL_TABLET | Freq: Two times a day (BID) | ORAL | Status: DC
  Administered 2022-08-07 – 2022-08-09 (×4): 10 mg via ORAL
  Filled 2022-08-07: qty 2
  Filled 2022-08-07 (×2): qty 1
  Filled 2022-08-07: qty 2

## 2022-08-07 MED ORDER — GABAPENTIN 100 MG PO CAPS
200.0000 mg | ORAL_CAPSULE | Freq: Two times a day (BID) | ORAL | Status: DC
  Administered 2022-08-07 – 2022-08-09 (×4): 200 mg via ORAL
  Filled 2022-08-07 (×4): qty 2

## 2022-08-07 NOTE — ED Provider Notes (Signed)
[facility] EMERGENCY DEPARTMENT Provider Note   CSN: [id number] Arrival date & time: 08/07/22  1101     History  Chief Complaint  Patient presents with   Loss of Consciousness    Bianca Garcia is a 81 y.o. female.  The history is provided by the patient, a relative and medical records. No language interpreter was used.  Loss of Consciousness Episode history:  Single Most recent episode:  Today Duration:  5 minutes Timing:  Constant Progression:  Resolved Chronicity:  New Witnessed: yes   Relieved by: sternal rub woke her up. Worsened by:  Nothing Ineffective treatments:  None tried Associated symptoms: confusion (at baseline)   Associated symptoms: no chest pain, no diaphoresis, no fever, no headaches, no malaise/fatigue, no nausea, no recent fall, no shortness of breath, no vomiting and no weakness   Risk factors: no seizures        Home Medications Prior to Admission medications   Medication Sig Start Date End Date Taking? Authorizing Provider  acetaminophen (TYLENOL) 325 MG tablet Take 650 mg by mouth See admin instructions. Take 650 mg 3 times daily, may take 650 mg 2 times daily as needed for pain    [provider]  busPIRone (BUSPAR) 10 MG tablet Take 1 tablet (10 mg total) by mouth 2 (two) times daily. 07/19/22   Royal Hawthorn, NP  Calcium Carb-Cholecalciferol (CALCIUM 500 + D PO) Take 1 tablet by mouth daily.    [provider]  calcium carbonate (TUMS - DOSED IN MG ELEMENTAL CALCIUM) 500 MG chewable tablet Chew 2 tablets by mouth 2 (two) times daily as needed for indigestion or heartburn.    [provider]  clonazePAM (KLONOPIN) 0.5 MG tablet Take 0.5 tablets (0.25 mg total) by mouth 2 (two) times daily as needed (for anxiety and shakiness). 08/05/22   Royal Hawthorn, NP  clopidogrel (PLAVIX) 75 MG tablet Take 1 tablet (75 mg total) by mouth daily. 09/23/17   Mary Sella, NP  diclofenac Sodium (VOLTAREN)  1 % GEL Apply topically 2 (two) times daily as needed.    [provider]  donepezil (ARICEPT) 10 MG tablet Take 1 tablet (10 mg total) by mouth daily. 03/26/22   Virgie Dad, MD  gabapentin (NEURONTIN) 100 MG capsule Take 200 mg by mouth 2 (two) times daily.    [provider]  gabapentin (NEURONTIN) 100 MG capsule Take 200 mg by mouth. Take in morning    [provider]  levothyroxine (SYNTHROID) 88 MCG tablet Take 88 mcg by mouth daily before breakfast.    [provider]  melatonin 5 MG TABS Take 5 mg by mouth at bedtime as needed (sleep).    [provider]  mirabegron ER (MYRBETRIQ) 25 MG TB24 tablet Take 1 tablet (25 mg total) by mouth daily. 09/16/21   Virgie Dad, MD  Multiple Vitamins-Minerals (PRESERVISION AREDS PO) Take 1 tablet by mouth 2 (two) times daily.     [provider]  omeprazole (PRILOSEC) 40 MG capsule Take 40 mg by mouth daily.    [provider]  propranolol (INDERAL) 40 MG tablet Take 40 mg by mouth 2 (two) times daily. 07/10/21   [provider]  rosuvastatin (CRESTOR) 5 MG tablet Take 1 tablet (5 mg total) by mouth daily. 06/23/22   Virgie Dad, MD      Allergies    Fluticasone, Aciphex [rabeprazole], Codeine, Codeine, Cymbalta [duloxetine hcl], Lexapro [escitalopram], Lipitor [atorvastatin calcium], Pneumovax [pneumococcal  polysaccharide vaccine], Prevacid [lansoprazole], Rosuvastatin, Sertraline, Statins, Sulfa antibiotics, Tetracyclines & related, Welchol [colesevelam], and Pneumovax 23 [pneumococcal vac polyvalent]    Review of Systems   Review of Systems  Constitutional:  Negative for chills, diaphoresis, fatigue, fever and malaise/fatigue.  HENT:  Negative for congestion.   Eyes:  Negative for visual disturbance.  Respiratory:  Negative for cough, chest tightness and shortness of breath.   Cardiovascular:  Positive for syncope. Negative for chest pain.  Gastrointestinal:   Negative for nausea and vomiting.  Genitourinary:  Negative for dysuria and flank pain.  Musculoskeletal:  Negative for back pain, neck pain and neck stiffness.  Skin:  Negative for rash and wound.  Neurological:  Positive for tremors (of head). Negative for weakness, light-headedness, numbness and headaches.  Psychiatric/Behavioral:  Positive for confusion (at baseline). Negative for agitation.   All other systems reviewed and are negative.   Physical Exam Updated Vital Signs BP (!) 127/92   Pulse (!) 58   Temp 98.3 F (36.8 C)   Resp 18   LMP 09/28/1979   SpO2 96%  Physical Exam Vitals and nursing note reviewed.  Constitutional:      General: She is not in acute distress.    Appearance: She is well-developed. She is not ill-appearing, toxic-appearing or diaphoretic.  HENT:     Head: Normocephalic and atraumatic.     Nose: Nose normal.     Mouth/Throat:     Mouth: Mucous membranes are moist.     Pharynx: No oropharyngeal exudate or posterior oropharyngeal erythema.  Eyes:     Extraocular Movements: Extraocular movements intact.     Conjunctiva/sclera: Conjunctivae normal.     Pupils: Pupils are equal, round, and reactive to light.  Cardiovascular:     Rate and Rhythm: Normal rate and regular rhythm.     Heart sounds: No murmur heard. Pulmonary:     Effort: Pulmonary effort is normal. No respiratory distress.     Breath sounds: Normal breath sounds.  Abdominal:     Palpations: Abdomen is soft.     Tenderness: There is no abdominal tenderness. There is no right CVA tenderness, left CVA tenderness, guarding or rebound.  Musculoskeletal:        General: Tenderness present. No swelling.     Cervical back: Neck supple.  Skin:    General: Skin is warm and dry.     Capillary Refill: Capillary refill takes less than 2 seconds.     Findings: No erythema.  Neurological:     General: No focal deficit present.     Mental Status: She is alert.     Sensory: No sensory deficit.      Motor: Tremor (of head) present. No weakness or abnormal muscle tone.     Coordination: Finger-Nose-Finger Test normal.  Psychiatric:        Mood and Affect: Mood normal.     ED Results / Procedures / Treatments   Labs (all labs ordered are listed, but only abnormal results are displayed) Labs Reviewed  BASIC METABOLIC PANEL - Abnormal; Notable for the following components:      Result Value   GFR, Estimated 60 (*)    All other components within normal limits  CBC WITH DIFFERENTIAL/PLATELET  URINALYSIS, ROUTINE W REFLEX MICROSCOPIC  TSH  MAGNESIUM  TROPONIN I (HIGH SENSITIVITY)  TROPONIN I (HIGH SENSITIVITY)    EKG EKG Interpretation  Date/Time:  Saturday August 07 2022 11:23:24 EST Ventricular Rate:  53 PR Interval:  184 QRS Duration:  84 QT Interval:  448 QTC Calculation: 420 R Axis:   -6 Text Interpretation: Sinus bradycardia Otherwise normal ECG When compared with ECG of 21-Dec-2017 13:01, PREVIOUS ECG IS PRESENT when compared to prior, similar appearance. NO STEMI Confirmed by Antony Blackbird [phone]) on 08/07/2022 3:05:53 PM  Radiology CT HEAD WO CONTRAST (5MM)  Result Date: 08/07/2022 CLINICAL DATA:  Syncope. EXAM: CT HEAD WITHOUT CONTRAST TECHNIQUE: Contiguous axial images were obtained from the base of the skull through the vertex without intravenous contrast. RADIATION DOSE REDUCTION: This exam was performed according to the departmental dose-optimization program which includes automated exposure control, adjustment of the mA and/or kV according to patient size and/or use of iterative reconstruction technique. COMPARISON:  07/24/2021 FINDINGS: Brain: No evidence of acute infarction, hemorrhage, hydrocephalus, extra-axial collection or mass lesion/mass effect. There is mild diffuse low-attenuation within the subcortical and periventricular white matter compatible with chronic microvascular disease. Prominence of the sulci and ventricles compatible with brain  atrophy. Vascular: No hyperdense vessel or unexpected calcification. Skull: Normal. Negative for fracture or focal lesion. Sinuses/Orbits: No acute finding. Other: None. IMPRESSION: 1. No acute intracranial abnormalities. 2. Chronic microvascular disease and brain atrophy. Electronically Signed   By: Kerby Moors M.D.   On: 08/07/2022 12:40   DG Chest 2 View  Result Date: 08/07/2022 CLINICAL DATA:  Syncope EXAM: CHEST - 2 VIEW COMPARISON:  07/24/2021 FINDINGS: The heart size and mediastinal contours are within normal limits. Implantable loop recorder. Mild diffuse bilateral interstitial pulmonary opacity. Disc degenerative disease of the thoracic spine. IMPRESSION: Mild diffuse bilateral interstitial pulmonary opacity, consistent with edema or atypical/viral infection. No focal airspace opacity. Electronically Signed   By: Delanna Ahmadi M.D.   On: 08/07/2022 12:04    Procedures Procedures    Medications Ordered in ED Medications  sodium chloride 0.9 % bolus 500 mL (has no administration in time range)    ED Course/ Medical Decision Making/ A&P                           Medical Decision Making   TRISTINE LANGI is a 81 y.o. female with a past medical history significant for previous stroke, GERD, dementia, TIA, hypothyroidism, hyperlipidemia, hypertension, and anxiety who presents with syncope and twitching.  According to family, patient lives at [place] and today had a 5-minute unresponsive syncopal episode.  She reportedly was seated when she slumped unresponsive for 5 minutes until she was sternal rub and able to wake back up.  She reportedly had a normal glucose and did not hit her head.  She denied any preceding symptoms but she does not member what happened exactly.  Since then she denies any recent chest pain, palpitations, shortness of breath, nausea, vomiting, constipation, diarrhea, or urinary changes.  Patient said that for the last few weeks she has been having twitching  episodes of her entire head.   Otherwise, reports that her mental status has been relatively as baseline with forgetfulness but otherwise she is feeling pleasant.  On exam, lungs clear and chest nontender.  Abdomen nontender.  Patient has good pulses in extremities.  Patient had no focal neurologic deficits with intact sensation, strength, and pulses in extremity.  Normal finger-nose-finger testing.  Symmetric smile.  Clear speech.  She did have head that would twitch briefly several times during our conversation and this appears to be new per patient.  She said she had it for the last few weeks.   Patient had work-up  starting in triage.  CT head did not show acute abnormality or acute stroke.  No bleeding seen.  Chest x-ray showed some edema but otherwise no large pneumonia.  CBC and metabolic panel did not show any critical normalities.  Ischial troponin negative.  Due to the patient's twitching episodes I called neurology and spoke to them and they recommended MRI brain without and EEG to look for intermittent seizure.  These were ordered.  Care transferred to oncoming team to await reassessment.  If work-up reassuring, dissipate discharge home if she proves stability without further episodes of syncope or near syncope.         Final Clinical Impression(s) / ED Diagnoses Final diagnoses:  Syncope and collapse  Twitching    Clinical Impression: 1. Syncope and collapse   2. Twitching     Disposition: Admit  This note was prepared with assistance of Systems analyst. Occasional wrong-word or sound-a-like substitutions may have occurred due to the inherent limitations of voice recognition software.      Nirvaan Frett, Gwenyth Allegra, MD 08/07/22 [id number]

## 2022-08-07 NOTE — Consult Note (Addendum)
Neurology Consultation Reason for Consult: Syncope versus seizure Requesting Physician: Toy Baker  CC: Syncopal event  History is obtained from: Chart review, attempted to reach family but there was no answer, unable to obtain from patient given baseline dementia   HPI: Bianca Garcia is a 81 y.o. 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  Per chart review she lives at [place] and had a 5-minute unresponsive syncopal episode.  She was initially seated when reportedly clutched her chest and then she slumped over and was unresponsive for 15 seconds versus 5 minutes (variable timeframe reported in different notes), until eventually arousing to sternal rub.  Reportedly glucose was normal and there is no clear preceding symptoms that the patient could describe.  She reported twitching episodes of her entire head for the last few weeks to ED provider.  On my own evaluation she was so greatly disoriented to time (reporting she was 61) and potentially confabulating (reporting she had had dinner with her friends), that I did not feel a review of systems would be of any diagnostic utility.  However per ED provider notes family states that she was well prior to the event today and is currently back to her baseline.  She was admitted for high risk syncope work-up.  Given head shaking, neurology was asked to consult for concern for seizure.  On review of the chart she was seen on 10/23 for facial tingling and had reported tongue numbness, twitching and facial tingling to a nurse on 10/19.  On evaluation at that time she was very anxious about her dog potentially having fleas and needing treatment.  She was started on BuSpar 10 mg twice daily for anxiety.  Subsequently she was seen on 11/7 for complaints of dizziness  and facial numbness, noted to be triggered by position changes.  She was felt to have nystagmus noticeable most on right gaze and was started on meclizine 12.5 mg twice daily with reduction of BuSpar to 5 mg twice daily.  Premorbid modified rankin scale:      3 - Moderate disability. Requires some help, but able to walk unassisted.  ROS: Unable to obtain due to altered mental status.   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 Street of back injections by Dr. Brien Few 2016 with good relief, history of cervical DD with possiblity of surgery by Dr. Trenton Gammon.   GERD (gastroesophageal reflux disease)    IBS with moderate refluc seen on upper GI study form January 2011 Dr. Watt Climes   HTN (hypertension)    no meds   Hyperlipidemia    Hypothyroidism    IBS (irritable bowel syndrome)    Imbalance    Impingement syndrome of right shoulder 01/05/2013   Mass of left side of neck    Memory loss    Mood disorder (HCC)    MVP (mitral valve prolapse)    Hx of    Osteopenia    Peripheral edema    Right rotator cuff tear 01/05/2013   Sciatic pain    recurrent   Seasonal  allergies    Stroke [facility])    Tension headache    TIA (transient ischemic attack)    Tremor    Past Surgical History:  Procedure Laterality Date   ANTERIOR CERVICAL DECOMP/DISCECTOMY FUSION  04/2015    Dr. Pamala Hurry, [place]  2004   which revealed smooth and normal coronary arteries   CARPAL TUNNEL RELEASE  2000   lt   CATARACT EXTRACTION W/ INTRAOCULAR LENS  IMPLANT, BILATERAL  1/17   Dr. Lucita Ferrara   COSMETIC SURGERY  2010   eyes-facial   [place]   LASIK     LOOP RECORDER INSERTION N/A 09/22/2017   Procedure:  LOOP RECORDER INSERTION;  Surgeon: Constance Haw, MD;  Location: [place] [facility];  Service: Cardiovascular;  Laterality: N/A;   LUMBAR LAMINECTOMY/DECOMPRESSION MICRODISCECTOMY N/A 06/08/2021   Procedure: Laminectomy and Foraminotomy - L4-L5, L5-S1;  Surgeon: Earnie Larsson, MD;  Location: [place];  Service: Neurosurgery;  Laterality: N/A;   SHOULDER ARTHROSCOPY WITH ROTATOR CUFF REPAIR Right 01/05/2013   Procedure: SHOULDER ARTHROSCOPY WITH ARTHROSCOPIC  ROTATOR CUFF REPAIR, ACROMIOPLASTY, EXTENSIVE DEBRIDEMENT;  Surgeon: Johnny Bridge, MD;  Location: [place];  Service: Orthopedics;  Laterality: Right;   TEE WITHOUT CARDIOVERSION N/A 09/22/2017   Procedure: TRANSESOPHAGEAL ECHOCARDIOGRAM (TEE);  Surgeon: Sanda Klein, MD;  Location: [facility];  Service: Cardiovascular;  Laterality: N/A;   TONSILLECTOMY     TUBAL LIGATION     Current Outpatient Medications  Medication Instructions   acetaminophen (TYLENOL) 650 mg, Oral, See admin instructions, Take 650 mg 3 times daily, may take 650 mg 2 times daily as needed for pain   busPIRone (BUSPAR) 10 mg, Oral, 2 times daily   Calcium Carb-Cholecalciferol (CALCIUM 500 + D PO) 1 tablet, Oral, Daily   calcium carbonate (TUMS - DOSED IN MG ELEMENTAL CALCIUM) 500 MG chewable tablet 2 tablets, Oral, 2 times daily PRN   clonazePAM (KLONOPIN) 0.25 mg, Oral, 2 times daily PRN   clopidogrel (PLAVIX) 75 mg, Oral, Daily   diclofenac Sodium (VOLTAREN) 2 g, Topical, 2 times daily PRN   donepezil (ARICEPT) 10 mg, Oral, Daily   gabapentin (NEURONTIN) 200 mg, Oral, 3 times daily   levothyroxine (SYNTHROID) 88 mcg, Oral, Daily before breakfast   meclizine (ANTIVERT) 12.5 mg, Oral, 2 times daily   melatonin 5 mg, At bedtime PRN   memantine (NAMENDA) 5 mg, Oral, 2 times daily   mirabegron ER (MYRBETRIQ) 25 mg, Oral, Daily   Multiple Vitamins-Minerals (PRESERVISION AREDS PO) 1 tablet, 2 times daily   omeprazole (PRILOSEC) 40 mg, Oral, Daily    propranolol (INDERAL) 40 mg, Oral, 2 times daily   rosuvastatin (CRESTOR) 5 mg, Oral, Daily     Family History  Problem Relation Age of Onset   Bladder Cancer Father    Mitral valve prolapse Mother    Cancer Maternal Grandfather     Social History:  reports that she quit smoking about 62 years ago. Her smoking use included cigarettes. She has never used smokeless tobacco. She reports that she does not currently use alcohol. She reports that she does not use drugs.   Exam: Current vital signs: BP (!) 155/65   Pulse 60   Temp (!) 97.2 F (36.2 C) (Oral)   Resp 19   Ht '5\' 3"'$  (1.6 m)   Wt 68 kg   LMP 09/28/1979   SpO2 97%   BMI 26.56 kg/m  Vital signs in last 24 hours: Temp:  [  97.2 F (36.2 C)-98.3 F (36.8 C)] 97.2 F (36.2 C) (11/11 2131) Pulse Rate:  [58-78] 60 (11/11 2230) Resp:  [12-26] 19 (11/11 2230) BP: (127-163)/(62-92) 155/65 (11/11 2230) SpO2:  [96 %-98 %] 97 % (11/11 2230) Weight:  [68 kg] 68 kg (11/11 2131)   Physical Exam  Constitutional: Appears well-developed and well-nourished.  No dizziness with position changes at this time Psych: Affect appropriate to situation, calm and cooperative, at times a little impulsive Eyes: No scleral injection HENT: No oropharyngeal obstruction.  MSK: no joint deformities.  Cardiovascular: Normal rate and regular rhythm. Perfusing extremities well Respiratory: Effort normal, non-labored breathing GI: Soft.  No distension. There is no tenderness.  Skin: Warm dry and intact visible skin  Neuro: Mental Status: Patient is awake, alert, oriented to self and initially reported she was at [facility] but then was surprised to learn that she was at [facility] later in our conversation.  Reported she lives at home and is 81 years old, repeatedly asking where her husband was, forgetting I had just answered the question.  Not oriented to time Patient has fluent speech and gives a lot of history about the events of the  day but none of this appears to be accurate.  Intact naming and repetition Cranial Nerves: II: Visual Fields are full. Pupils are equal, round, and reactive to light.   III,IV, VI: EOMI without ptosis or diploplia.  No nystagmus. V: Facial sensation is symmetric to temperature VII: Facial movement is symmetric.  VIII: hearing is intact to voice X: Uvula elevates symmetrically XI: Shoulder shrug is symmetric. XII: tongue is midline without atrophy or fasciculations.  Motor: Tone is normal. Bulk is normal. 5/5 strength was present in all four extremities.  Sensory: Sensation is symmetric to light touch and temperature in the arms and legs. Deep Tendon Reflexes: 3+ and symmetric in the brachioradialis and patellae.  Plantars: Toes are downgoing bilaterally.  Cerebellar: FNF and HKS are intact bilaterally.  She does have an action tremor consistent with essential tremor, involving the right upper extremity more than the left Gait:  Deferred   NIHSS total 2 Score breakdown: For not knowing month and age Performed at 10:15 PM   I have reviewed labs in epic and the results pertinent to this consultation are:  Basic Metabolic Panel: Recent Labs  Lab 08/07/22 1122 08/07/22 1615  NA 141  --   K 4.2  --   CL 104  --   CO2 29  --   GLUCOSE 99  --   BUN 14  --   CREATININE 0.95  --   CALCIUM 9.2  --   MG  --  2.2  PHOS  --  4.0    CBC: Recent Labs  Lab 08/07/22 1122  WBC 9.9  NEUTROABS 7.5  HGB 14.1  HCT 43.4  MCV 98.9  PLT 301    Coagulation Studies: No results for input(s): "LABPROT", "INR" in the last 72 hours.    I have reviewed the images obtained:  MRI brain personally reviewed, agree with radiology that there is no evidence of any acute intracranial process  Impression: Overall, event as described in other notes it does not sound concerning for seizure activity.  Given patients with dementia are at risk for epilepsy, obtaining a screening EEG is  reasonable.  However given her known diagnosis of essential tremor, if EEG is negative I would attribute her head shaking episodes to exacerbation of her essential tremor which she  does typically increase gradually with age and acutely with stress/anxiety  Initial recommendations: -MRI brain to exclude acute intracranial process -EEG to screen for seizure activity  Recommendations: -Neurology will follow-up EEG read tomorrow if this is negative for epileptogenic activity, would not make any acute changes in her neurological regimen and would simply continue outpatient follow-up -Appreciate syncope work-up and management of comorbidities per primary team  Lesleigh Noe MD-PhD [facility] [phone] Available 7 PM to 7 AM, outside of these hours please call Neurologist on call as listed on Amion.

## 2022-08-07 NOTE — Assessment & Plan Note (Signed)
CONTINUE Plavix 75 mg po Q day and Crestor 10 mg po q day

## 2022-08-07 NOTE — Assessment & Plan Note (Signed)
Check orthostatics vital signs

## 2022-08-07 NOTE — Assessment & Plan Note (Signed)
Hold propranolol for now

## 2022-08-07 NOTE — Assessment & Plan Note (Signed)
Given bradycardia, hold propranolol

## 2022-08-07 NOTE — Assessment & Plan Note (Signed)
Continue synthroid at 88 mcg po q day

## 2022-08-07 NOTE — Assessment & Plan Note (Signed)
Cont Buspar at 10 mg po Qday

## 2022-08-07 NOTE — Assessment & Plan Note (Signed)
Continue Aricept 10 mg po Qd , nemenda '5mg'$  po qday Monitor for sign downing

## 2022-08-07 NOTE — H&P (Signed)
Bianca Garcia HMC:[id number] DOB: 09-Nov-1940 DOA: 08/07/2022     PCP: Virgie Dad, MD   [facility]  [facility] [place] [address]pp [place], [place]-     Patient arrived to ER on 08/07/22 at 1101 Referred by Attending Pattricia Boss, MD   Patient coming from:     From facility [facility]  Chief Complaint:   Chief Complaint  Patient presents with   Loss of Consciousness    HPI: Bianca Garcia is a 81 y.o. 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 lives at [place] For the past 2 wks she has been reporting being swimmy headed worse when changes positions was prescribed Buspar for anxiety Has hx of Vertigo in 2019 Was seen by PCP on the 7th and noted to have Nystagmus Was started on Meclizine  Occasional Beer no tobacco   Regarding pertinent Chronic problems:    Hyperlipidemia -  on statins Crestor Lipid Panel     Component Value Date/Time   CHOL 174 03/23/2022 0000   TRIG 134 03/23/2022 0000   HDL 66 03/23/2022 0000   CHOLHDL 2.1 09/21/2017 0250   VLDL 7 09/21/2017 0250   [place] 81 03/23/2022 0000    HTN on propranolol     Hypothyroidism:  Lab Results  Component Value Date   TSH 1.147 08/07/2022   on synthroid       Hx of CVA - *with/out residual deficits on Aspirin 81 mg, 325, Plavix    Dementia - on Aricept Nemenda  GERD on Prilosec  While in ER:      CT HEAD   NON acute  CXR -Mild diffuse bilateral interstitial pulmonary opacity, consistent with edema or atypical/viral infection  MRI head head - neg    Following Medications were ordered in ER: Medications  sodium  chloride 0.9 % bolus 500 mL (0 mLs Intravenous Stopped 08/07/22 1702)    _______________________________________________________ ER Provider Called: Neurology   Dr. Lorrin Goodell They Recommend admit to medicine get MRI and EEG     ED Triage Vitals [08/07/22 1118]  Enc Vitals Group     BP (!) 154/67     Pulse Rate (!) 59     Resp 14     Temp (!) 97.5 F (36.4 C)     Temp Source Oral     SpO2 96 %     Weight      Height      Head Circumference      Peak Flow      Pain Score      Pain Loc      Pain Edu?      Excl. in [place]?   MOQH(47)@     _________________________________________ Significant initial  Findings: Abnormal Labs Reviewed  BASIC METABOLIC PANEL - Abnormal; Notable for the following components:      Result Value   GFR, Estimated 60 (*)    All other components within normal limits    _________________________ Troponin 4 - 5 ECG: Ordered Personally reviewed and interpreted by me showing: HR : 53 Rhythm:   Sinus bradycardia Otherwise normal ECG  QTC 420  The recent clinical data is shown below. Vitals:   08/07/22 1118 08/07/22 1341 08/07/22 1612 08/07/22 1645  BP: (!) 154/67 (!) 127/92 139/69   Pulse: (!) 59 (!) 58  61  Resp: '14 18 12 15  '$ Temp: (!) 97.5 F (36.4 C) 98.3 F (36.8 C)    TempSrc: Oral     SpO2: 96% 96%  96%    WBC     Component Value Date/Time   WBC 9.9 08/07/2022 1122   LYMPHSABS 1.3 08/07/2022 1122   MONOABS 0.8 08/07/2022 1122   EOSABS 0.2 08/07/2022 1122   BASOSABS 0.0 08/07/2022 1122      UA  ordered   Urine analysis:    Component Value Date/Time   COLORURINE YELLOW 07/24/2021 [place] 07/24/2021 1534   LABSPEC 1.034 (H) 07/24/2021 1534   PHURINE 6.0 07/24/2021 [place] 07/24/2021 [place] 07/24/2021 [place] 07/24/2021 1534   KETONESUR 20 (A) 07/24/2021 1534   PROTEINUR NEGATIVE 07/24/2021 1534   NITRITE NEGATIVE 07/24/2021 [place]  07/24/2021 1534    Results for orders placed or performed during the hospital encounter of 06/08/21  Surgical pcr screen     Status: None   Collection Time: 06/08/21  9:58 AM   Specimen: Nasal Mucosa; Nasal Swab  Result Value Ref Range Status   MRSA, PCR NEGATIVE NEGATIVE Final   Staphylococcus aureus NEGATIVE NEGATIVE Final    Comment: (NOTE) The Xpert SA Assay (FDA approved for NASAL specimens in patients 42 years of age and older), is one component of a comprehensive surveillance program. It is not intended to diagnose infection nor to guide or monitor treatment. Performed at [facility], [place] [address], [place], [place] [address]     _______________________________________________ Hospitalist was called for admission for   Syncope and collapse    The following Work up has been ordered so far:  Orders Placed This Encounter  Procedures   DG Chest 2 View   CT HEAD WO CONTRAST (5MM)   MR BRAIN WO CONTRAST   CBC with Differential   Basic metabolic panel   Urinalysis, Routine w reflex microscopic   TSH   Magnesium   Cardiac monitoring   Consult to hospitalist   ED EKG   EEG adult     OTHER Significant initial  Findings:  labs showing:  Recent Labs  Lab 08/07/22 1122 08/07/22 1615  NA 141  --   K 4.2  --   CO2 29  --   GLUCOSE 99  --   BUN 14  --   CREATININE 0.95  --   CALCIUM 9.2  --   MG  --  2.2    Cr   stable,   Lab Results  Component Value Date   CREATININE 0.95 08/07/2022   CREATININE 0.7 07/20/2022   CREATININE 0.7 03/23/2022    No results for input(s): "AST", "ALT", "ALKPHOS", "BILITOT", "PROT", "ALBUMIN" in the last 168 hours. Lab Results  Component Value Date   CALCIUM 9.2 08/07/2022   PHOS 4.5 09/21/2017    Plt: Lab Results  Component Value Date   PLT 301 08/07/2022     COVID-19 Labs  No results for input(s): "DDIMER", "FERRITIN", "LDH", "CRP" in the last 72 hours.  Lab Results  Component Value Date   [place]  NEGATIVE 06/05/2021       Recent Labs  Lab 08/07/22 1122  WBC 9.9  NEUTROABS 7.5  HGB 14.1  HCT 43.4  MCV 98.9  PLT 301    HG/HCT   stable     Component Value Date/Time   HGB 14.1 08/07/2022 1122   HCT 43.4 08/07/2022 1122   MCV 98.9 08/07/2022 1122         Cultures: No results found for: "SDES", "SPECREQUEST", "CULT", "REPTSTATUS"   Radiological Exams on Admission: CT Angio Chest PE W and/or Wo Contrast  Result Date: 08/07/2022 CLINICAL DATA:  Chest pain or SOB, pleurisy or effusion suspected EXAM: CT ANGIOGRAPHY CHEST WITH CONTRAST TECHNIQUE: Multidetector CT imaging of the chest was performed using the standard protocol during bolus administration of intravenous contrast. Multiplanar CT image reconstructions and MIPs were obtained to evaluate the vascular anatomy. RADIATION DOSE REDUCTION: This exam was performed according to the departmental dose-optimization program which includes automated exposure control, adjustment of the mA and/or kV according to patient size and/or use of iterative reconstruction technique. CONTRAST:  85m OMNIPAQUE IOHEXOL 350 MG/ML SOLN COMPARISON:  Chest x-ray 08/07/2022 FINDINGS: Cardiovascular: Satisfactory opacification of the pulmonary arteries to the segmental level. No evidence of pulmonary embolism. The main pulmonary artery is normal in caliber. Normal heart size. No pericardial effusion. Mild atherosclerotic plaque. At least 2 vessel coronary calcification. Mediastinum/Nodes: No enlarged mediastinal, hilar, or axillary lymph nodes. Thyroid gland, trachea, and esophagus demonstrate no significant findings. Lungs/Pleura: Bilateral lower lobe atelectasis. Biapical pleural/pulmonary scarring. No focal consolidation. No pulmonary nodule. No pulmonary mass. No pleural effusion. No pneumothorax. Upper Abdomen: No acute abnormality. Musculoskeletal: No chest wall abnormality. No suspicious lytic or blastic osseous lesions. No acute displaced fracture.  Anterior cervical discectomy and fusion of the C5 through C7 levels. Grade 1 anterolisthesis C7 on T1. Multilevel degenerative changes of the spine. Degenerative changes of bilateral shoulders. Review of the MIP images confirms the above findings. IMPRESSION: 1. No pulmonary embolus. 2. No acute intrathoracic abnormality. Electronically Signed   By: MIven FinnM.D.   On: 08/07/2022 20:19   MR BRAIN WO CONTRAST  Result Date: 08/07/2022 CLINICAL DATA:  Neuro deficit, acute, stroke suspected Intermittent head shaking that is new. Neurology recommend MRI brain without and EEG. EXAM: MRI HEAD WITHOUT CONTRAST TECHNIQUE: Multiplanar, multiecho pulse sequences of the brain and surrounding structures were obtained without intravenous contrast. COMPARISON:  MRI head December 21 2017. FINDINGS: Brain: No acute infarction, hemorrhage, hydrocephalus, extra-axial collection or mass lesion. Scattered T2/FLAIR hyperintensities the white matter, nonspecific but compatible chronic microvascular ischemic disease. Cerebral atrophy. Vascular: Major arterial flow voids are maintained at the skull base. Skull and upper cervical spine: Normal marrow signal. Sinuses/Orbits: Clear sinuses.  No acute orbital findings. Other: No mastoid effusions IMPRESSION: No evidence of acute intracranial abnormality. Electronically Signed   By: FMargaretha SheffieldM.D.   On: 08/07/2022 17:46   CT HEAD WO CONTRAST (5MM)  Result Date: 08/07/2022 CLINICAL DATA:  Syncope. EXAM: CT HEAD WITHOUT CONTRAST TECHNIQUE: Contiguous axial images were obtained from the base of the skull through the vertex without intravenous contrast. RADIATION DOSE REDUCTION: This exam was performed according to the departmental dose-optimization program which includes automated exposure control, adjustment of the mA and/or kV according to patient size and/or use of iterative reconstruction technique. COMPARISON:  07/24/2021 FINDINGS: Brain: No evidence of acute infarction,  hemorrhage, hydrocephalus, extra-axial collection or mass lesion/mass effect. There is mild diffuse low-attenuation within the subcortical and periventricular white matter compatible with chronic microvascular disease. Prominence of the sulci and ventricles compatible with brain atrophy. Vascular: No hyperdense vessel  or unexpected calcification. Skull: Normal. Negative for fracture or focal lesion. Sinuses/Orbits: No acute finding. Other: None. IMPRESSION: 1. No acute intracranial abnormalities. 2. Chronic microvascular disease and brain atrophy. Electronically Signed   By: Kerby Moors M.D.   On: 08/07/2022 12:40   DG Chest 2 View  Result Date: 08/07/2022 CLINICAL DATA:  Syncope EXAM: CHEST - 2 VIEW COMPARISON:  07/24/2021 FINDINGS: The heart size and mediastinal contours are within normal limits. Implantable loop recorder. Mild diffuse bilateral interstitial pulmonary opacity. Disc degenerative disease of the thoracic spine. IMPRESSION: Mild diffuse bilateral interstitial pulmonary opacity, consistent with edema or atypical/viral infection. No focal airspace opacity. Electronically Signed   By: Delanna Ahmadi M.D.   On: 08/07/2022 12:04   _______________________________________________________________________________________________________ Latest  Blood pressure 139/69, pulse 61, temperature 98.3 F (36.8 C), resp. rate 15, last menstrual period 09/28/1979, SpO2 96 %.   Vitals  labs and radiology finding personally reviewed  Review of Systems:    Pertinent positives include:  light headed fatigue,  Constitutional:  No weight loss, night sweats, Fevers, chills, weight loss  HEENT:  No headaches, Difficulty swallowing,Tooth/dental problems,Sore throat,  No sneezing, itching, ear ache, nasal congestion, post nasal drip,  Cardio-vascular:  No chest pain, Orthopnea, PND, anasarca, dizziness, palpitations.no Bilateral lower extremity swelling  GI:  No heartburn, indigestion, abdominal pain,  nausea, vomiting, diarrhea, change in bowel habits, loss of appetite, melena, blood in stool, hematemesis Resp:  no shortness of breath at rest. No dyspnea on exertion, No excess mucus, no productive cough, No non-productive cough, No coughing up of blood.No change in color of mucus.No wheezing. Skin:  no rash or lesions. No jaundice GU:  no dysuria, change in color of urine, no urgency or frequency. No straining to urinate.  No flank pain.  Musculoskeletal:  No joint pain or no joint swelling. No decreased range of motion. No back pain.  Psych:  No change in mood or affect. No depression or anxiety. No memory loss.  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 Street of back injections by Dr. Brien Few 2016 with good relief, history of cervical DD with possiblity of surgery by Dr. Trenton Gammon.   GERD (gastroesophageal reflux disease)    IBS with moderate refluc seen on upper GI study form January 2011 Dr. Watt Climes   HTN (hypertension)    no meds   Hyperlipidemia    Hypothyroidism    IBS (irritable bowel syndrome)    Imbalance    Impingement syndrome of right shoulder 01/05/2013   Mass of left side of neck    Memory loss    Mood disorder (HCC)    MVP (mitral valve prolapse)    Hx of    Osteopenia    Peripheral edema    Right rotator cuff tear 01/05/2013   Sciatic pain    recurrent   Seasonal  allergies    Stroke (HCC)    Tension headache    TIA (transient ischemic attack)  Tremor       Past Surgical History:  Procedure Laterality Date   ANTERIOR CERVICAL DECOMP/DISCECTOMY FUSION  04/2015    Dr. Pamala Hurry, [place]  2004   which revealed smooth and normal coronary arteries   CARPAL TUNNEL RELEASE  2000   lt   CATARACT EXTRACTION W/ INTRAOCULAR LENS  IMPLANT, BILATERAL  1/17   Dr. Lucita Ferrara   COSMETIC SURGERY  2010   eyes-facial   DENTAL SURGERY  1960   LASIK     LOOP RECORDER INSERTION N/A 09/22/2017   Procedure: LOOP RECORDER INSERTION;  Surgeon: Constance Haw, MD;  Location: [facility];  Service: Cardiovascular;  Laterality: N/A;   LUMBAR LAMINECTOMY/DECOMPRESSION MICRODISCECTOMY N/A 06/08/2021   Procedure: Laminectomy and Foraminotomy - L4-L5, L5-S1;  Surgeon: Earnie Larsson, MD;  Location: [facility];  Service: Neurosurgery;  Laterality: N/A;   SHOULDER ARTHROSCOPY WITH ROTATOR CUFF REPAIR Right 01/05/2013   Procedure: SHOULDER ARTHROSCOPY WITH ARTHROSCOPIC  ROTATOR CUFF REPAIR, ACROMIOPLASTY, EXTENSIVE DEBRIDEMENT;  Surgeon: Johnny Bridge, MD;  Location: [facility];  Service: Orthopedics;  Laterality: Right;   TEE WITHOUT CARDIOVERSION N/A 09/22/2017   Procedure: TRANSESOPHAGEAL ECHOCARDIOGRAM (TEE);  Surgeon: Sanda Klein, MD;  [facility];  Service: Cardiovascular;  Laterality: N/A;   TONSILLECTOMY     TUBAL LIGATION      Social History:  Ambulatory   walker        reports that she quit smoking about 62 years ago. Her smoking use included cigarettes. She has never used smokeless tobacco. She reports that she does not currently use alcohol. She reports that she does not use drugs.     Family History:   Family History  Problem Relation Age of Onset   Bladder Cancer Father    Mitral valve prolapse Mother    Cancer Maternal Grandfather     ______________________________________________________________________________________________ Allergies: Allergies  Allergen Reactions   Fluticasone Other (See Comments)    DRY EYES   Aciphex [Rabeprazole]     Critical    Codeine Nausea Only   Codeine    Cymbalta [Duloxetine Hcl]     Critical    Lexapro [Escitalopram]     critical   Lipitor [Atorvastatin Calcium]     Intolerant    Pneumovax [Pneumococcal Polysaccharide Vaccine]     moderate   Prevacid [Lansoprazole]     Unknown, listed on MAR   Rosuvastatin     Intolerant    Sertraline     critical   Statins     Critical    Sulfa Antibiotics     vomiting   Tetracyclines & Related     Morphine Sulfate   Welchol [Colesevelam]     Unknown, listed on MAR   Pneumovax 23 [Pneumococcal Vac Polyvalent] Rash    Injection site reaction     Prior to Admission medications   Medication Sig Start Date End Date Taking? Authorizing Provider  acetaminophen (TYLENOL) 325 MG tablet Take 650 mg by mouth See admin instructions. Take 650 mg 3 times daily, may take 650 mg 2 times daily as needed for pain    [provider]  busPIRone (BUSPAR) 10 MG tablet Take 1 tablet (10 mg total) by mouth 2 (two) times daily. 07/19/22   Royal Hawthorn, NP  Calcium Carb-Cholecalciferol (CALCIUM 500 + D PO) Take 1 tablet by mouth daily.    [provider]  calcium carbonate (TUMS - DOSED IN MG ELEMENTAL CALCIUM) 500 MG chewable tablet  Chew 2 tablets by mouth 2 (two) times daily as needed for indigestion or heartburn.    [provider]  clonazePAM (KLONOPIN) 0.5 MG tablet Take 0.5 tablets (0.25 mg total) by mouth 2 (two) times daily as needed (for anxiety and shakiness). 08/05/22   Royal Hawthorn, NP  clopidogrel (PLAVIX) 75 MG tablet Take 1 tablet (75 mg total) by mouth daily. 09/23/17   Mary Sella, NP  diclofenac Sodium (VOLTAREN) 1 % GEL Apply topically 2 (two) times daily as needed.    [provider]   donepezil (ARICEPT) 10 MG tablet Take 1 tablet (10 mg total) by mouth daily. 03/26/22   Virgie Dad, MD  gabapentin (NEURONTIN) 100 MG capsule Take 200 mg by mouth 2 (two) times daily.    [provider]  gabapentin (NEURONTIN) 100 MG capsule Take 200 mg by mouth. Take in morning    [provider]  levothyroxine (SYNTHROID) 88 MCG tablet Take 88 mcg by mouth daily before breakfast.    [provider]  melatonin 5 MG TABS Take 5 mg by mouth at bedtime as needed (sleep).    [provider]  mirabegron ER (MYRBETRIQ) 25 MG TB24 tablet Take 1 tablet (25 mg total) by mouth daily. 09/16/21   Virgie Dad, MD  Multiple Vitamins-Minerals (PRESERVISION AREDS PO) Take 1 tablet by mouth 2 (two) times daily.     [provider]  omeprazole (PRILOSEC) 40 MG capsule Take 40 mg by mouth daily.    [provider]  propranolol (INDERAL) 40 MG tablet Take 40 mg by mouth 2 (two) times daily. 07/10/21   [provider]  rosuvastatin (CRESTOR) 5 MG tablet Take 1 tablet (5 mg total) by mouth daily. 06/23/22   Virgie Dad, MD    ___________________________________________________________________________________________________ Physical Exam:    08/07/2022    4:45 PM 08/07/2022    4:12 PM 08/07/2022    1:41 PM  Vitals with BMI  Systolic  673 419  Diastolic  69 92  Pulse 61  58     1. General:  in No  Acute distress    Chronically ill  -appearing 2. Psychological: Alert and   Oriented 3. Head/ENT:    Dry Mucous Membranes                          Head Non traumatic, neck supple                           Poor Dentition 4. SKIN:   Skin turgor,  Skin clean Dry and intact no rash 5. Heart: Regular rate and rhythm no  Murmur, no Rub or gallop 6. Lungs:  Clear to auscultation bilaterally, no wheezes or crackles   7. Abdomen: Soft,  non-tender, Non distended bowel sounds present 8. Lower extremities: no clubbing, cyanosis, no  edema 9.  Neurologically  strength 5 out of 5 in all 4 extremities cranial nerves II through XII intact 10. MSK: Normal range of motion    Chart has been reviewed  ______________________________________________________________________________________________  Assessment/Plan 81 y.o. female with medical history significant of CVA, dementia, HTN, HLD, vertigo, anxiety, GERD, hypothyroidism  Admitted for   Syncope and collapse  head Twitching    Present on Admission:  Syncope  Essential hypertension  Hyperlipidemia  Hypothyroidism  Anxiety  Coronary artery calcification  Orthostatic hypotension  Tremor  Vascular dementia without behavioral disturbance ([place])  Syncope Obtain echo CE neg x2  Ecg non ischemic Can attempt to interrogate pacemaker If work up is abnormal can bnefit from cardiology consult in AM NEurolgy aware givne head jerking hx of and Vertigo MRI neg, can order EEG    Essential hypertension Given bradycardia, hold propranolol  Hyperlipidemia Restart Crestor at 5 mg po qday  Hypothyroidism Continue synthroid at 88 mcg po q day  Anxiety Cont Buspar at 10 mg po Qday  Coronary artery calcification Chronic stable  History of stroke CONTINUE Plavix 75 mg po Q day and Crestor 10 mg po q day  Orthostatic hypotension Check orthostatics vital signs  Tremor Hold propranolol for now  Vascular dementia without behavioral disturbance (HCC) Continue Aricept 10 mg po Qd , nemenda '5mg'$  po qday Monitor for sign downing      Other plan as per orders.  DVT prophylaxis:  SCD     Code Status:    Code Status: Prior FULL CODE  as per patient  family  I had personally discussed CODE STATUS with patient and family    Family Communication:   Family   at  Bedside  plan of care was discussed  with   Daughter in [place]    Disposition Plan:   Back to current facility when stable                               Following barriers for discharge:                             Electrolytes corrected                                                          Will need consultants to evaluate patient prior to discharge                      Would benefit from PT/OT eval prior to DC  Ordered                                       Transition of care consulted                     Consults called: NEurolgy is aware  Admission status:  ED Disposition     ED Disposition  Admit   Condition  --   [place]: [place] [100100]  Level of Care: Telemetry Cardiac [103]  May place patient in observation at [facility] or [place] if equivalent level of care is available:: No  Covid Evaluation: Asymptomatic - no recent exposure (last 10 days) testing not required  Diagnosis: Syncope [017494]  Admitting Physician: Toy Baker [[id number]]  Attending Physician: Toy Baker [[id number]]            Obs      Level of care     tele  For  24H      Endrit Gittins 08/07/2022, 8:47 PM    [facility]     after 2 AM please page floor coverage PA If 7AM-7PM, please contact the day team  taking care of the patient using [email or web address]   Patient was evaluated in the context of the global COVID-19 pandemic, which necessitated consideration that the patient might be at risk for infection with the SARS-CoV-2 virus that causes COVID-19. Institutional protocols and algorithms that pertain to the evaluation of patients at risk for COVID-19 are in a state of rapid change based on information released by regulatory bodies including the CDC and federal and state organizations. These policies and algorithms were followed during the patient's care.

## 2022-08-07 NOTE — ED Notes (Signed)
Pt taken to CT.

## 2022-08-07 NOTE — Progress Notes (Signed)
EEG complete - results pending 

## 2022-08-07 NOTE — ED Provider Notes (Signed)
  Physical Exam  BP 139/69   Pulse (!) 58   Temp 98.3 F (36.8 C)   Resp 12   LMP 09/28/1979   SpO2 96%   Physical Exam  Procedures  Procedures  ED Course / MDM    Medical Decision Making Amount and/or Complexity of Data Reviewed Labs: ordered. Radiology: ordered.  Risk Decision regarding hospitalization.   81 yo female from [facility] with syncopal episode unresponsive for 5 minutes on awakening Head shaking intermittently. MRI head and eeg pending Likely admission for high risk syncope MRI reviewed without any evidence of acute abnormality. Discussed with patient and daughter-in-law.  She was well prior to event today.  She is currently back to baseline. Given patient's age and comorbidities, patient with high risk syncope. Plan admission for ongoing monitoring and evaluation DW Dr. Roel Cluck and cta ordered per her request- she will follow up     Pattricia Boss, MD 08/07/22 1954

## 2022-08-07 NOTE — Subjective & Objective (Signed)
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

## 2022-08-07 NOTE — ED Triage Notes (Signed)
Patient BIB [facility] from [facility] w/ an incidence of a syncopal episode. Per staff they reported while patient was seated she clutched her chest and went unconscious for about 15 seconds and then recovered. Patient is endorsing dizziness especially while standing. Pt does have hx of vertigo. Took her 12.5 mg of meclizine this AM.  All VSS.

## 2022-08-07 NOTE — Assessment & Plan Note (Addendum)
Obtain echo CE neg x2  Ecg non ischemic Can attempt to interrogate pacemaker If work up is abnormal can bnefit from cardiology consult in AM NEurolgy aware givne head jerking hx of and Vertigo MRI neg, can order EEG

## 2022-08-07 NOTE — Assessment & Plan Note (Signed)
Restart Crestor at 5 mg po qday

## 2022-08-07 NOTE — ED Provider Triage Note (Signed)
Emergency Medicine Provider Triage Evaluation Note  Bianca Garcia , a 81 y.o. female  was evaluated in triage.  Pt complains of syncope apparently sitting down, slumped over.  Unresponsive for few minutes.  States has been otherwise well.  She denies any preceding symptoms.  Hx mild dementia  Review of Systems  Positive: syncope Negative:   Physical Exam  LMP 09/28/1979  Gen:   Awake, no distress   Resp:  Normal effort  MSK:   Moves extremities without difficulty  Neuro:  CN 2-12 grossly intact Other:    Medical Decision Making  Medically screening exam initiated at 11:15 AM.  Appropriate orders placed.  SHRINIKA BLATZ was informed that the remainder of the evaluation will be completed by another provider, this initial triage assessment does not replace that evaluation, and the importance of remaining in the ED until their evaluation is complete.  syncope   Mal Asher A, PA-C 08/07/22 1116

## 2022-08-07 NOTE — ED Notes (Signed)
Neurologist at bedside. 

## 2022-08-07 NOTE — Assessment & Plan Note (Signed)
Chronic-stable.

## 2022-08-07 NOTE — ED Notes (Signed)
EEG tech at bedside. 

## 2022-08-08 DIAGNOSIS — Z79899 Other long term (current) drug therapy: Secondary | ICD-10-CM | POA: Diagnosis not present

## 2022-08-08 DIAGNOSIS — M5030 Other cervical disc degeneration, unspecified cervical region: Secondary | ICD-10-CM | POA: Diagnosis present

## 2022-08-08 DIAGNOSIS — I2510 Atherosclerotic heart disease of native coronary artery without angina pectoris: Secondary | ICD-10-CM | POA: Diagnosis present

## 2022-08-08 DIAGNOSIS — R253 Fasciculation: Secondary | ICD-10-CM | POA: Diagnosis present

## 2022-08-08 DIAGNOSIS — M1990 Unspecified osteoarthritis, unspecified site: Secondary | ICD-10-CM | POA: Diagnosis present

## 2022-08-08 DIAGNOSIS — R55 Syncope and collapse: Secondary | ICD-10-CM | POA: Diagnosis present

## 2022-08-08 DIAGNOSIS — Z885 Allergy status to narcotic agent status: Secondary | ICD-10-CM | POA: Diagnosis not present

## 2022-08-08 DIAGNOSIS — Z7401 Bed confinement status: Secondary | ICD-10-CM | POA: Diagnosis not present

## 2022-08-08 DIAGNOSIS — R410 Disorientation, unspecified: Secondary | ICD-10-CM | POA: Diagnosis not present

## 2022-08-08 DIAGNOSIS — G250 Essential tremor: Secondary | ICD-10-CM | POA: Diagnosis present

## 2022-08-08 DIAGNOSIS — Z7902 Long term (current) use of antithrombotics/antiplatelets: Secondary | ICD-10-CM | POA: Diagnosis not present

## 2022-08-08 DIAGNOSIS — I341 Nonrheumatic mitral (valve) prolapse: Secondary | ICD-10-CM | POA: Diagnosis present

## 2022-08-08 DIAGNOSIS — E785 Hyperlipidemia, unspecified: Secondary | ICD-10-CM | POA: Diagnosis present

## 2022-08-08 DIAGNOSIS — I10 Essential (primary) hypertension: Secondary | ICD-10-CM | POA: Diagnosis present

## 2022-08-08 DIAGNOSIS — E039 Hypothyroidism, unspecified: Secondary | ICD-10-CM | POA: Diagnosis present

## 2022-08-08 DIAGNOSIS — K219 Gastro-esophageal reflux disease without esophagitis: Secondary | ICD-10-CM | POA: Diagnosis present

## 2022-08-08 DIAGNOSIS — F0150 Vascular dementia without behavioral disturbance: Secondary | ICD-10-CM | POA: Diagnosis not present

## 2022-08-08 DIAGNOSIS — Z888 Allergy status to other drugs, medicaments and biological substances status: Secondary | ICD-10-CM | POA: Diagnosis not present

## 2022-08-08 DIAGNOSIS — F01A4 Vascular dementia, mild, with anxiety: Secondary | ICD-10-CM | POA: Diagnosis present

## 2022-08-08 DIAGNOSIS — Z79890 Hormone replacement therapy (postmenopausal): Secondary | ICD-10-CM | POA: Diagnosis not present

## 2022-08-08 DIAGNOSIS — Z8673 Personal history of transient ischemic attack (TIA), and cerebral infarction without residual deficits: Secondary | ICD-10-CM | POA: Diagnosis not present

## 2022-08-08 DIAGNOSIS — Z887 Allergy status to serum and vaccine status: Secondary | ICD-10-CM | POA: Diagnosis not present

## 2022-08-08 DIAGNOSIS — E782 Mixed hyperlipidemia: Secondary | ICD-10-CM | POA: Diagnosis not present

## 2022-08-08 DIAGNOSIS — F419 Anxiety disorder, unspecified: Secondary | ICD-10-CM | POA: Diagnosis not present

## 2022-08-08 DIAGNOSIS — I951 Orthostatic hypotension: Secondary | ICD-10-CM | POA: Diagnosis present

## 2022-08-08 DIAGNOSIS — Z1152 Encounter for screening for COVID-19: Secondary | ICD-10-CM | POA: Diagnosis not present

## 2022-08-08 DIAGNOSIS — Z882 Allergy status to sulfonamides status: Secondary | ICD-10-CM | POA: Diagnosis not present

## 2022-08-08 DIAGNOSIS — Z87891 Personal history of nicotine dependence: Secondary | ICD-10-CM | POA: Diagnosis not present

## 2022-08-08 DIAGNOSIS — Z66 Do not resuscitate: Secondary | ICD-10-CM | POA: Diagnosis not present

## 2022-08-08 DIAGNOSIS — R531 Weakness: Secondary | ICD-10-CM | POA: Diagnosis not present

## 2022-08-08 LAB — LACTIC ACID, PLASMA: Lactic Acid, Venous: 1.2 mmol/L (ref 0.5–1.9)

## 2022-08-08 LAB — OSMOLALITY, URINE: Osmolality, Ur: 447 mOsm/kg (ref 300–900)

## 2022-08-08 LAB — OSMOLALITY: Osmolality: 295 mOsm/kg (ref 275–295)

## 2022-08-08 MED ORDER — ENOXAPARIN SODIUM 40 MG/0.4ML IJ SOSY
40.0000 mg | PREFILLED_SYRINGE | INTRAMUSCULAR | Status: DC
  Administered 2022-08-08 – 2022-08-09 (×2): 40 mg via SUBCUTANEOUS
  Filled 2022-08-08 (×2): qty 0.4

## 2022-08-08 MED ORDER — PROPRANOLOL HCL 20 MG PO TABS
20.0000 mg | ORAL_TABLET | Freq: Two times a day (BID) | ORAL | Status: DC
  Administered 2022-08-08 – 2022-08-09 (×2): 20 mg via ORAL
  Filled 2022-08-08 (×5): qty 1

## 2022-08-08 NOTE — Progress Notes (Signed)
Brief Neuro Update:  Routine EEG negative. We will signoff. Please feel free to contact us with any questions or concerns.  [place] Pager Number [phone]

## 2022-08-08 NOTE — Progress Notes (Signed)
Report given to oncoming shift RN Hassan Rowan. Patient arrived to unit at shift change @ approx 1855. Appears to be stable.

## 2022-08-08 NOTE — Progress Notes (Signed)
PROGRESS NOTE        PATIENT DETAILS Name: Bianca Garcia Age: 81 y.o. Sex: female Date of Birth: 11-19-1940 Admit Date: 08/07/2022 Admitting Physician Toy Baker, MD Bianca Garcia, Bianca Kocher, MD  Brief Summary: Patient is a 81 y.o.  female with history of dementia, CVA, HTN, HLD-presented with syncopal episode.  See below for further details.  Significant events: 11/11>> admit to [facility]-syncope.  Significant studies: 11/11>> CT head: No acute abnormalities. 11/11>> MRI brain: No acute intracranial abnormality. 11/11>> CT angio: No PE. 11/12>> Spot EEG: No seizures  Significant microbiology data: 11/11>> COVID PCR: Negative.  Procedures: None  Consults: Neurology  Subjective: Minimally confused but redirectable.  Did not know she was in the hospital but later on acknowledges she was in [facility].  Did not observe any twitching  Objective: Vitals: Blood pressure (!) 162/81, pulse 60, temperature 97.8 F (36.6 C), temperature source Oral, resp. rate 13, height '5\' 3"'$  (1.6 m), weight 68 kg, last menstrual period 09/28/1979, SpO2 94 %.   Exam: Gen Exam:Alert awake-not in any distress.  Pleasantly confused. HEENT:atraumatic, normocephalic Chest: B/L clear to auscultation anteriorly CVS:S1S2 regular Abdomen:soft non tender, non distended Extremities:no edema Neurology: Non focal Skin: no rash  Pertinent Labs/Radiology:    Latest Ref Rng & Units 08/07/2022   11:43 PM 08/07/2022   11:22 AM 07/20/2022   12:00 AM  CBC  WBC 4.0 - 10.5 K/uL  9.9  5.8      Hemoglobin 12.0 - 15.0 g/dL 12.6  14.1  14.5      Hematocrit 36.0 - 46.0 % 37.0  43.4  42      Platelets 150 - 400 K/uL  301  224         This result is from an external source.    Lab Results  Component Value Date   NA 142 08/07/2022   K 3.5 08/07/2022   CL 104 08/07/2022   CO2 29 08/07/2022      Assessment/Plan: Syncope Likely vasovagal Telemetry negative for  arrhythmias Neuroimaging negative/EEG negative Await echo Mobilize with PT/OT Continue telemetry monitoring  Twitching episodes Not felt to be seizures EEG negative MRI brain negative Did not observe these movements this morning Per neurology-likely flare of underlying essential tremor due to acute illness/stress.  Dementia Mild delirium Maintain delirium precautions Continue Aricept/Namenda  History of CVA Continue antiplatelet/statin Nonfocal exam  HLD Continue Crestor  Hypothyroidism Continue Synthroid  Anxiety Continue BuSpar  Essential tremor Supportive care Propranolol hold for mild bradycardia-we will resume at a lower dose and see what happens with a heart rate.   BMI: Estimated body mass index is 26.56 kg/m as calculated from the following:   Height as of this encounter: '5\' 3"'$  (1.6 m).   Weight as of this encounter: 68 kg.   Code status:    Code Status: Full Code   DVT prophylaxis: enoxaparin (LOVENOX) injection 40 mg Start: 08/08/22 0945 SCDs Start: 08/07/22 2113    Family Communication:  Kenzlie Disch [phone] on 11/12, this MD could not get hold of spouse-voicemail was full   Disposition Plan: Status is: Observation The patient remains OBS appropriate and will d/c before 2 midnights.   Planned Discharge Destination:Home health likely [facility] on 11/13.   Diet: Diet Order  Diet Heart Room service appropriate? Yes; Fluid consistency: Thin  Diet effective now                     Antimicrobial agents: Anti-infectives (From admission, onward)    None        MEDICATIONS: Scheduled Meds:  busPIRone  10 mg Oral BID   clopidogrel  75 mg Oral Daily   donepezil  10 mg Oral Daily   enoxaparin (LOVENOX) injection  40 mg Subcutaneous Q24H   gabapentin  200 mg Oral BID   levothyroxine  88 mcg Oral QAC breakfast   melatonin  3 mg Oral QHS   propranolol  20 mg Oral BID    rosuvastatin  5 mg Oral Daily   Continuous Infusions:  sodium chloride 100 mL/hr at 08/08/22 0633   PRN Meds:.acetaminophen **OR** acetaminophen, HYDROcodone-acetaminophen   I have personally reviewed following labs and imaging studies  LABORATORY DATA: CBC: Recent Labs  Lab 08/07/22 1122 08/07/22 2343  WBC 9.9  --   NEUTROABS 7.5  --   HGB 14.1 12.6  HCT 43.4 37.0  MCV 98.9  --   PLT 301  --     Basic Metabolic Panel: Recent Labs  Lab 08/07/22 1122 08/07/22 1615 08/07/22 2343  NA 141  --  142  K 4.2  --  3.5  CL 104  --   --   CO2 29  --   --   GLUCOSE 99  --   --   BUN 14  --   --   CREATININE 0.95  --   --   CALCIUM 9.2  --   --   MG  --  2.2  --   PHOS  --  4.0  --     GFR: Estimated Creatinine Clearance: 42.2 mL/min (by C-G formula based on SCr of 0.95 mg/dL).  Liver Function Tests: Recent Labs  Lab 08/07/22 1615  AST 20  ALT 14  ALKPHOS 69  BILITOT 0.5  PROT 6.8  ALBUMIN 3.6   No results for input(s): "LIPASE", "AMYLASE" in the last 168 hours. No results for input(s): "AMMONIA" in the last 168 hours.  Coagulation Profile: No results for input(s): "INR", "PROTIME" in the last 168 hours.  Cardiac Enzymes: Recent Labs  Lab 08/07/22 1615  CKTOTAL 38    BNP (last 3 results) No results for input(s): "PROBNP" in the last 8760 hours.  Lipid Profile: No results for input(s): "CHOL", "HDL", "LDLCALC", "TRIG", "CHOLHDL", "LDLDIRECT" in the last 72 hours.  Thyroid Function Tests: Recent Labs    08/07/22 1615  TSH 1.147    Anemia Panel: No results for input(s): "VITAMINB12", "FOLATE", "FERRITIN", "TIBC", "IRON", "RETICCTPCT" in the last 72 hours.  Urine analysis:    Component Value Date/Time   COLORURINE YELLOW 08/07/2022 2144   APPEARANCEUR CLEAR 08/07/2022 2144   LABSPEC >1.046 (H) 08/07/2022 2144   PHURINE 7.0 08/07/2022 2144   GLUCOSEU NEGATIVE 08/07/2022 2144   HGBUR NEGATIVE 08/07/2022 2144   BILIRUBINUR NEGATIVE 08/07/2022  2144   KETONESUR NEGATIVE 08/07/2022 2144   PROTEINUR NEGATIVE 08/07/2022 2144   NITRITE NEGATIVE 08/07/2022 2144   LEUKOCYTESUR NEGATIVE 08/07/2022 2144    Sepsis Labs: Lactic Acid, Venous    Component Value Date/Time   LATICACIDVEN 1.2 08/07/2022 2326    MICROBIOLOGY: Recent Results (from the past 240 hour(s))  SARS Coronavirus 2 by RT PCR (hospital order, performed in [facility] hospital lab) *cepheid single result test* Anterior Nasal Swab  Status: None   Collection Time: 08/07/22  9:44 PM   Specimen: Anterior Nasal Swab  Result Value Ref Range Status   SARS Coronavirus 2 by RT PCR NEGATIVE NEGATIVE Final    Comment: (NOTE) SARS-CoV-2 target nucleic acids are NOT DETECTED.  The SARS-CoV-2 RNA is generally detectable in upper and lower respiratory specimens during the acute phase of infection. The lowest concentration of SARS-CoV-2 viral copies this assay can detect is 250 copies / mL. A negative result does not preclude SARS-CoV-2 infection and should not be used as the sole basis for treatment or other patient management decisions.  A negative result may occur with improper specimen collection / handling, submission of specimen other than nasopharyngeal swab, presence of viral mutation(s) within the areas targeted by this assay, and inadequate number of viral copies (<250 copies / mL). A negative result must be combined with clinical observations, patient history, and epidemiological information.  Fact Sheet for Patients:   https://www.patel.info/  Fact Sheet for Healthcare Providers: https://hall.com/  This test is not yet approved or  cleared by the [place] FDA and has been authorized for detection and/or diagnosis of SARS-CoV-2 by FDA under an Emergency Use Authorization (EUA).  This EUA will remain in effect (meaning this test can be used) for the duration of the COVID-19 declaration under Section 564(b)(1) of  the Act, 21 U.S.C. section 360bbb-3(b)(1), unless the authorization is terminated or revoked sooner.  Performed at [facility], [place] [address] [place], [place] [address]     RADIOLOGY STUDIES/RESULTS: EEG adult  Result Date: 08/08/2022 Lora Havens, MD     08/08/2022  8:57 AM Patient Name: TIPPI MCCRAE MRN: [id number] Epilepsy Attending: Lora Havens Referring Physician/Provider: Tegeler, Gwenyth Allegra, MD Date: 08/07/2022 Duration: 22.33 mins Patient history: 81yo F with syncope. EEG to evaluate for seizure Level of alertness: Awake AEDs during EEG study: GBP Technical aspects: This EEG study was done with scalp electrodes positioned according to the 10-20 International system of electrode placement. Electrical activity was reviewed with band pass filter of 1-'70Hz'$ , sensitivity of 7 uV/mm, display speed of 24m/sec with a '60Hz'$  notched filter applied as appropriate. EEG data were recorded continuously and digitally stored.  Video monitoring was available and reviewed as appropriate. Description: The posterior dominant rhythm consists of 9 Hz activity of moderate voltage (25-35 uV) seen predominantly in posterior head regions, symmetric and reactive to eye opening and eye closing. There is intermittent generalized 15 to 18 Hz beta activity. Hyperventilation and photic stimulation were not performed.   IMPRESSION: This study is within normal limits. No seizures or epileptiform discharges were seen throughout the recording. PLora Havens  CT Angio Chest PE W and/or Wo Contrast  Result Date: 08/07/2022 CLINICAL DATA:  Chest pain or SOB, pleurisy or effusion suspected EXAM: CT ANGIOGRAPHY CHEST WITH CONTRAST TECHNIQUE: Multidetector CT imaging of the chest was performed using the standard protocol during bolus administration of intravenous contrast. Multiplanar CT image reconstructions and MIPs were obtained to evaluate the vascular anatomy. RADIATION DOSE REDUCTION: This exam was  performed according to the departmental dose-optimization program which includes automated exposure control, adjustment of the mA and/or kV according to patient size and/or use of iterative reconstruction technique. CONTRAST:  746mOMNIPAQUE IOHEXOL 350 MG/ML SOLN COMPARISON:  Chest x-ray 08/07/2022 FINDINGS: Cardiovascular: Satisfactory opacification of the pulmonary arteries to the segmental level. No evidence of pulmonary embolism. The main pulmonary artery is normal in caliber. Normal heart size. No pericardial effusion.  Mild atherosclerotic plaque. At least 2 vessel coronary calcification. Mediastinum/Nodes: No enlarged mediastinal, hilar, or axillary lymph nodes. Thyroid gland, trachea, and esophagus demonstrate no significant findings. Lungs/Pleura: Bilateral lower lobe atelectasis. Biapical pleural/pulmonary scarring. No focal consolidation. No pulmonary nodule. No pulmonary mass. No pleural effusion. No pneumothorax. Upper Abdomen: No acute abnormality. Musculoskeletal: No chest wall abnormality. No suspicious lytic or blastic osseous lesions. No acute displaced fracture. Anterior cervical discectomy and fusion of the C5 through C7 levels. Grade 1 anterolisthesis C7 on T1. Multilevel degenerative changes of the spine. Degenerative changes of bilateral shoulders. Review of the MIP images confirms the above findings. IMPRESSION: 1. No pulmonary embolus. 2. No acute intrathoracic abnormality. Electronically Signed   By: Iven Finn M.D.   On: 08/07/2022 20:19   MR BRAIN WO CONTRAST  Result Date: 08/07/2022 CLINICAL DATA:  Neuro deficit, acute, stroke suspected Intermittent head shaking that is new. Neurology recommend MRI brain without and EEG. EXAM: MRI HEAD WITHOUT CONTRAST TECHNIQUE: Multiplanar, multiecho pulse sequences of the brain and surrounding structures were obtained without intravenous contrast. COMPARISON:  MRI head December 21 2017. FINDINGS: Brain: No acute infarction, hemorrhage,  hydrocephalus, extra-axial collection or mass lesion. Scattered T2/FLAIR hyperintensities the white matter, nonspecific but compatible chronic microvascular ischemic disease. Cerebral atrophy. Vascular: Major arterial flow voids are maintained at the skull base. Skull and upper cervical spine: Normal marrow signal. Sinuses/Orbits: Clear sinuses.  No acute orbital findings. Other: No mastoid effusions IMPRESSION: No evidence of acute intracranial abnormality. Electronically Signed   By: Margaretha Sheffield M.D.   On: 08/07/2022 17:46   CT HEAD WO CONTRAST (5MM)  Result Date: 08/07/2022 CLINICAL DATA:  Syncope. EXAM: CT HEAD WITHOUT CONTRAST TECHNIQUE: Contiguous axial images were obtained from the base of the skull through the vertex without intravenous contrast. RADIATION DOSE REDUCTION: This exam was performed according to the departmental dose-optimization program which includes automated exposure control, adjustment of the mA and/or kV according to patient size and/or use of iterative reconstruction technique. COMPARISON:  07/24/2021 FINDINGS: Brain: No evidence of acute infarction, hemorrhage, hydrocephalus, extra-axial collection or mass lesion/mass effect. There is mild diffuse low-attenuation within the subcortical and periventricular white matter compatible with chronic microvascular disease. Prominence of the sulci and ventricles compatible with brain atrophy. Vascular: No hyperdense vessel or unexpected calcification. Skull: Normal. Negative for fracture or focal lesion. Sinuses/Orbits: No acute finding. Other: None. IMPRESSION: 1. No acute intracranial abnormalities. 2. Chronic microvascular disease and brain atrophy. Electronically Signed   By: Kerby Moors M.D.   On: 08/07/2022 12:40   DG Chest 2 View  Result Date: 08/07/2022 CLINICAL DATA:  Syncope EXAM: CHEST - 2 VIEW COMPARISON:  07/24/2021 FINDINGS: The heart size and mediastinal contours are within normal limits. Implantable loop  recorder. Mild diffuse bilateral interstitial pulmonary opacity. Disc degenerative disease of the thoracic spine. IMPRESSION: Mild diffuse bilateral interstitial pulmonary opacity, consistent with edema or atypical/viral infection. No focal airspace opacity. Electronically Signed   By: Delanna Ahmadi M.D.   On: 08/07/2022 12:04     LOS: 0 days   Oren Binet, MD  [facility]    To contact the attending provider between 7A-7P or the covering provider during after hours 7P-7A, please log into the web site [email or web address] and access using universal [place] password for that web site. If you do not have the password, please call the hospital operator.  08/08/2022, 9:38 AM

## 2022-08-08 NOTE — Plan of Care (Signed)
  Problem: Health Behavior/Discharge Planning: Goal: Ability to manage health-related needs will improve Outcome: Progressing   

## 2022-08-08 NOTE — Evaluation (Signed)
Occupational Therapy Evaluation Patient Details Name: Bianca Garcia MRN: [id number] DOB: [birth year] Today's Date: 08/08/2022   History of Present Illness Bianca Garcia is a 81 y.o. female Presented with  syncopal event;  with medical history significant of CVA, dementia, HTN, HLD, vertigo, anxiety, GERD, hypothyroidism   Clinical Impression   Pt was evaluated s/p the above admission list, she is from [place] with her husband and is mod I for mobility and min A-mod I for ADLs. Upon evaluation pt demonstrated functional limitations due to generalized weakness, poor balance with posterior LOBs, dizziness, decreased activity tolerance and limited insight. Overall she required min-mod A for bed mobility and min A for transfers with RW. Due to deficits listed below, she requires up to max A for ADLs. Several Bps taken throughout session due to dizziness and decreased LOA symptoms, BPs were stable or unreliable. Continue to assess. OT to continue to follow acutely. Recommend d/c to SNF for continued therapies.    Recommendations for follow up therapy are one component of a multi-disciplinary discharge planning process, led by the attending physician.  Recommendations may be updated based on patient status, additional functional criteria and insurance authorization.   Follow Up Recommendations  Skilled nursing-short term rehab (<3 hours/day) (SNF at [place] Recommended at Discharge Frequent or constant Supervision/Assistance  Patient can return home with the following A lot of help with walking and/or transfers;A lot of help with bathing/dressing/bathroom;Assistance with cooking/housework;Direct supervision/assist for medications management;Direct supervision/assist for financial management;Assist for transportation;Help with stairs or ramp for entrance    Functional Status Assessment  Patient has had a recent decline in their functional status and demonstrates the ability  to make significant improvements in function in a reasonable and predictable amount of time.  Equipment Recommendations  None recommended by OT    Recommendations for Other Services       Precautions / Restrictions Precautions Precautions: Fall Restrictions Weight Bearing Restrictions: No      Mobility Bed Mobility Overal bed mobility: Needs Assistance Bed Mobility: Supine to Sit, Sit to Supine     Supine to sit: Mod assist Sit to supine: +2 for physical assistance, Max assist        Transfers Overall transfer level: Needs assistance Equipment used: Rolling walker (2 wheels) Transfers: Sit to/from Stand, Bed to chair/wheelchair/BSC Sit to Stand: Min assist     Step pivot transfers: Min assist            Balance Overall balance assessment: Needs assistance Sitting-balance support: Feet supported Sitting balance-Leahy Scale: Fair Sitting balance - Comments: posterior bias   Standing balance support: Bilateral upper extremity supported, During functional activity Standing balance-Leahy Scale: Poor Standing balance comment: prolonged standing for BPs                           ADL either performed or assessed with clinical judgement   ADL Overall ADL's : Needs assistance/impaired Eating/Feeding: Set up;Sitting   Grooming: Set up;Sitting   Upper Body Bathing: Minimal assistance;Sitting   Lower Body Bathing: Maximal assistance;Sit to/from stand   Upper Body Dressing : Minimal assistance;Sitting   Lower Body Dressing: Maximal assistance;Sit to/from stand   Toilet Transfer: Minimal assistance;Stand-pivot;BSC/3in1;Rolling walker (2 wheels)   Toileting- Clothing Manipulation and Hygiene: Maximal assistance;Sit to/from stand       Functional mobility during ADLs: Minimal assistance;Cueing for safety;Rolling walker (2 wheels) General ADL Comments: limited by posterior bias, dizziness, weakness and activity tolerance  Vision Baseline  Vision/History: 0 No visual deficits Vision Assessment?: No apparent visual deficits            Pertinent Vitals/Pain Pain Assessment Pain Assessment: Faces Faces Pain Scale: Hurts little more Pain Location: most of pt's reports of pain are related to tightness of BP cuff Pain Descriptors / Indicators: Grimacing, Tightness Pain Intervention(s): Limited activity within patient's tolerance, Monitored during session     Hand Dominance Left   Extremity/Trunk Assessment Upper Extremity Assessment Upper Extremity Assessment: Generalized weakness   Lower Extremity Assessment Lower Extremity Assessment: Defer to PT evaluation   Cervical / Trunk Assessment Cervical / Trunk Assessment: Kyphotic (mild)   Communication Communication Communication: No difficulties   Cognition Arousal/Alertness: Awake/alert Behavior During Therapy: WFL for tasks assessed/performed Overall Cognitive Status: History of cognitive impairments - at baseline     General Comments: history of dementia     General Comments  BP fluctuating, unsure of accuracy of readings            Home Living Family/patient expects to be discharged to:: Assisted living                             Home Equipment: Conservation officer, nature (2 wheels);Shower seat - built in   Additional Comments: Pt and husband walk to dining hall for 3 meals a day (approx 40-50 yards per husband); pt uses RW to walk      Prior Functioning/Environment Prior Level of Function : Needs assist             Mobility Comments: Walks with RW to dining hall 3x/day ADLs Comments: Husband reports pt has staff assist to shower        OT Problem List: Decreased strength;Decreased range of motion;Decreased activity tolerance;Impaired balance (sitting and/or standing);Decreased safety awareness;Decreased knowledge of use of DME or AE;Decreased knowledge of precautions;Pain      OT Treatment/Interventions: Self-care/ADL  training;Therapeutic exercise;Neuromuscular education;Therapeutic activities;Patient/family education;Balance training    OT Goals(Current goals can be found in the care plan section) Acute Rehab OT Goals Patient Stated Goal: to feel better OT Goal Formulation: With patient Time For Goal Achievement: 08/22/22 Potential to Achieve Goals: Good ADL Goals Pt Will Perform Grooming: with min guard assist;standing Pt Will Perform Lower Body Dressing: with min guard assist;sit to/from stand Pt Will Transfer to Toilet: with min guard assist;ambulating Additional ADL Goal #1: Pt will demonstrate increased activity tolerance to complete at least 8 minutes of standing activity without sitting rest break  OT Frequency: Min 2X/week    Co-evaluation PT/OT/SLP Co-Evaluation/Treatment: Yes Reason for Co-Treatment: Complexity of the patient's impairments (multi-system involvement);For patient/therapist safety;To address functional/ADL transfers PT goals addressed during session: Mobility/safety with mobility OT goals addressed during session: ADL's and self-care      AM-PAC OT "6 Clicks" Daily Activity     Outcome Measure Help from another person eating meals?: A Little Help from another person taking care of personal grooming?: A Little Help from another person toileting, which includes using toliet, bedpan, or urinal?: A Lot Help from another person bathing (including washing, rinsing, drying)?: A Lot Help from another person to put on and taking off regular upper body clothing?: A Little Help from another person to put on and taking off regular lower body clothing?: A Lot 6 Click Score: 15   End of Session Equipment Utilized During Treatment: Gait belt;Rolling walker (2 wheels) Nurse Communication: Mobility status  Activity Tolerance: Patient tolerated treatment well  Patient left: in bed;with call bell/phone within reach  OT Visit Diagnosis: Unsteadiness on feet (R26.81);Other abnormalities of  gait and mobility (R26.89);Muscle weakness (generalized) (M62.81);History of falling (Z91.81);Dizziness and giddiness (R42)                Time: 1041-1110 OT Time Calculation (min): 29 min Charges:  OT General Charges $OT Visit: 1 Visit OT Evaluation $OT Eval Moderate Complexity: 1 Mod    Jacion Dismore D Causey 08/08/2022, 1:57 PM

## 2022-08-08 NOTE — ED Notes (Signed)
Pt is resting at this time

## 2022-08-08 NOTE — Evaluation (Signed)
Physical Therapy Evaluation Patient Details Name: Bianca Garcia MRN: [id number] DOB: August 19, 1941 Today's Date: 08/08/2022  History of Present Illness  Bianca Garcia is a 81 y.o. female Presented with  syncopal event;  with medical history significant of CVA, dementia, HTN, HLD, vertigo, anxiety, GERD, hypothyroidism  Clinical Impression   Pt admitted with above diagnosis. she is from [place] with her husband and is mod I for mobility and min A-mod I for ADLs. Upon evaluation pt demonstrated functional limitations due to generalized weakness, poor balance with posterior LOBs, dizziness, decreased activity tolerance and limited insight. Overall she required min-mod A for bed mobility and min A for transfers with RW; Tends to drift backwards in sitting, eyes drift closed in sitting/standing, orthostatic BPs inconclusive; history of vertigo; Hope to get a closer look at [facility] function next session; Pt currently with functional limitations due to the deficits listed below (see PT Problem List). Pt will benefit from skilled PT to increase their independence and safety with mobility to allow discharge to the venue listed below.          Recommendations for follow up therapy are one component of a multi-disciplinary discharge planning process, led by the attending physician.  Recommendations may be updated based on patient status, additional functional criteria and insurance authorization.  Follow Up Recommendations Skilled nursing-short term rehab (<3 hours/day) Can patient physically be transported by private vehicle: No (perhaps soon)    Assistance Recommended at Discharge Frequent or constant Supervision/Assistance  Patient can return home with the following  A lot of help with walking and/or transfers    Equipment Recommendations Rolling walker (2 wheels);BSC/3in1;Wheelchair (measurements PT);Wheelchair cushion (measurements PT)  Recommendations for Other Services       Functional  Status Assessment Patient has had a recent decline in their functional status and demonstrates the ability to make significant improvements in function in a reasonable and predictable amount of time.     Precautions / Restrictions Precautions Precautions: Fall Restrictions Weight Bearing Restrictions: No      Mobility  Bed Mobility Overal bed mobility: Needs Assistance Bed Mobility: Supine to Sit, Sit to Supine     Supine to sit: Mod assist Sit to supine: +2 for physical assistance, Max assist   General bed mobility comments: Good initiation and effort to come to sit; light mod assist to scoot hip to edge, then heavy mod for balance due to persistent drift into posterior lean; 2 person max assist to lay back down to stretcher; high stretcher, and pt began to slip at edge of stretcher    Transfers Overall transfer level: Needs assistance Equipment used: Rolling walker (2 wheels) Transfers: Sit to/from Stand, Bed to chair/wheelchair/BSC Sit to Stand: Min assist   Step pivot transfers: Min assist       General transfer comment: cues to self-monitor for activity tolerance    Ambulation/Gait               General Gait Details: unable; drifitng into eyes closed and feeling weak with standing  Stairs            Wheelchair Mobility    Modified Rankin (Stroke Patients Only)       Balance Overall balance assessment: Needs assistance   Sitting balance-Leahy Scale: Fair Sitting balance - Comments: posterior bias     Standing balance-Leahy Scale: Poor Standing balance comment: prolonged standing for BPs  Pertinent Vitals/Pain Pain Assessment Pain Assessment: Faces Faces Pain Scale: Hurts little more Pain Location: most of pt's reports of pain are related to tightness of BP cuff Pain Descriptors / Indicators: Grimacing, Tightness Pain Intervention(s): Repositioned    Home Living Family/patient expects to be  discharged to:: Assisted living                 Home Equipment: Conservation officer, nature (2 wheels);Shower seat - built in Additional Comments: Pt and husband walk to dining hall for 3 meals a day (approx 40-50 yards per husband); pt uses RW to walk    Prior Function Prior Level of Function : Needs assist             Mobility Comments: Walks with RW to dining hall 3x/day ADLs Comments: Husband reports pt has staff assist to shower     Hand Dominance   Dominant Hand: Left    Extremity/Trunk Assessment   Upper Extremity Assessment Upper Extremity Assessment: Generalized weakness    Lower Extremity Assessment Lower Extremity Assessment: Defer to PT evaluation    Cervical / Trunk Assessment Cervical / Trunk Assessment: Kyphotic (mild)  Communication   Communication: No difficulties  Cognition Arousal/Alertness: Awake/alert Behavior During Therapy: WFL for tasks assessed/performed Overall Cognitive Status: History of cognitive impairments - at baseline                                 General Comments: history of dementia        General Comments General comments (skin integrity, edema, etc.): BP fluctuating, unsure of accuracy of readings    Exercises     Assessment/Plan    PT Assessment Patient needs continued PT services  PT Problem List Decreased strength;Decreased activity tolerance;Decreased balance;Decreased mobility;Decreased coordination;Decreased cognition;Decreased knowledge of use of DME;Decreased safety awareness;Decreased knowledge of precautions       PT Treatment Interventions DME instruction;Gait training;Functional mobility training;Therapeutic activities;Therapeutic exercise;Balance training;Neuromuscular re-education;Patient/family education    PT Goals (Current goals can be found in the Care Plan section)  Acute Rehab PT Goals Patient Stated Goal: Did not state PT Goal Formulation: With patient Time For Goal Achievement:  08/22/22 Potential to Achieve Goals: Good    Frequency Min 3X/week     Co-evaluation PT/OT/SLP Co-Evaluation/Treatment: Yes Reason for Co-Treatment: Complexity of the patient's impairments (multi-system involvement);For patient/therapist safety;To address functional/ADL transfers PT goals addressed during session: Mobility/safety with mobility OT goals addressed during session: ADL's and self-care       AM-PAC PT "6 Clicks" Mobility  Outcome Measure Help needed turning from your back to your side while in a flat bed without using bedrails?: A Lot Help needed moving from lying on your back to sitting on the side of a flat bed without using bedrails?: A Lot Help needed moving to and from a bed to a chair (including a wheelchair)?: A Lot Help needed standing up from a chair using your arms (e.g., wheelchair or bedside chair)?: A Little Help needed to walk in hospital room?: Total Help needed climbing 3-5 steps with a railing? : Total 6 Click Score: 11    End of Session Equipment Utilized During Treatment: Gait belt Activity Tolerance: Patient limited by fatigue Patient left: with call bell/phone within reach;in bed (on stretcher) Nurse Communication: Mobility status PT Visit Diagnosis: Unsteadiness on feet (R26.81);Other abnormalities of gait and mobility (R26.89);History of falling (Z91.81);Dizziness and giddiness (R42)    Time: 6073-7106 PT Time Calculation (min) (  ACUTE ONLY): 54 min   Charges:   PT Evaluation $PT Eval Moderate Complexity: 1 Mod PT Treatments $Therapeutic Activity: 8-22 mins        Roney Marion, PT  Acute Rehabilitation Services Office [phone]   Colletta [place] 08/08/2022, 2:20 PM

## 2022-08-08 NOTE — ED Notes (Signed)
PT at bedside.

## 2022-08-08 NOTE — Procedures (Signed)
Patient Name: Bianca Garcia  MRN: [id number]  Epilepsy Attending: Lora Havens  Referring Physician/Provider: Tegeler, Gwenyth Allegra, MD  Date: 08/07/2022 Duration: 22.33 mins  Patient history: 81yo F with syncope. EEG to evaluate for seizure  Level of alertness: Awake  AEDs during EEG study: GBP  Technical aspects: This EEG study was done with scalp electrodes positioned according to the 10-20 International system of electrode placement. Electrical activity was reviewed with band pass filter of 1-'70Hz'$ , sensitivity of 7 uV/mm, display speed of 82m/sec with a '60Hz'$  notched filter applied as appropriate. EEG data were recorded continuously and digitally stored.  Video monitoring was available and reviewed as appropriate.  Description: The posterior dominant rhythm consists of 9 Hz activity of moderate voltage (25-35 uV) seen predominantly in posterior head regions, symmetric and reactive to eye opening and eye closing. There is intermittent generalized 15 to 18 Hz beta activity. Hyperventilation and photic stimulation were not performed.     IMPRESSION: This study is within normal limits. No seizures or epileptiform discharges were seen throughout the recording.  Delana Manganello OBarbra Sarks

## 2022-08-08 NOTE — ED Notes (Signed)
Pt sundowning - pt growing more confused, repetitive questioning, pulling off monitoring equipment. Pt moved to room 29 for better surveillance and placed on posey monitor

## 2022-08-09 ENCOUNTER — Inpatient Hospital Stay (HOSPITAL_COMMUNITY): Payer: Medicare Other

## 2022-08-09 DIAGNOSIS — I10 Essential (primary) hypertension: Secondary | ICD-10-CM | POA: Diagnosis not present

## 2022-08-09 DIAGNOSIS — R55 Syncope and collapse: Secondary | ICD-10-CM

## 2022-08-09 DIAGNOSIS — E782 Mixed hyperlipidemia: Secondary | ICD-10-CM | POA: Diagnosis not present

## 2022-08-09 DIAGNOSIS — E039 Hypothyroidism, unspecified: Secondary | ICD-10-CM | POA: Diagnosis not present

## 2022-08-09 LAB — ECHOCARDIOGRAM COMPLETE
AR max vel: 2.6 cm2
AV Area VTI: 2.45 cm2
AV Area mean vel: 2.19 cm2
AV Mean grad: 4 mmHg
AV Peak grad: 6.8 mmHg
Ao pk vel: 1.3 m/s
Area-P 1/2: 2.83 cm2
Calc EF: 56.2 %
Height: 63 in
P 1/2 time: 719 msec
S' Lateral: 2.5 cm
Single Plane A2C EF: 54.2 %
Single Plane A4C EF: 59.7 %
Weight: 2398.6 oz

## 2022-08-09 MED ORDER — PROPRANOLOL HCL 40 MG PO TABS: 20.0000 mg | ORAL_TABLET | Freq: Two times a day (BID) | ORAL | Status: DC

## 2022-08-09 MED ORDER — CLONAZEPAM 0.5 MG PO TABS: 0.2500 mg | ORAL_TABLET | Freq: Two times a day (BID) | ORAL | 0 refills | Status: DC | PRN

## 2022-08-09 NOTE — NC FL2 (Signed)
[place] LEVEL OF CARE SCREENING TOOL     IDENTIFICATION  Patient Name: Bianca Garcia Birthdate: 1941-07-30 Sex: female Admission Date (Current Location): 08/07/2022  [facility] and [place] Number:  Herbalist and Address:  [facility]. 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   Vascular dementia without behavioral disturbance ([place]) 05/18/2021   Orthostatic hypotension 01/19/2018   History of stroke 09/20/2017   Stroke (cerebrum) ([place]) 09/20/2017   Paresthesia 08/01/2017   Tremor 08/01/2017   Anxiety 02/27/2016   IBS (irritable bowel syndrome) 02/27/2016   Coronary artery calcification 02/20/2016   Hypothyroidism 06/09/2015   Right rotator cuff tear 01/05/2013   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   Biceps tendonitis 01/05/2013   F[place]ater, vitreous 03/21/2012   Macular degeneration 03/21/2012   Essential hypertension 12/22/2011   Hyperlipidemia 12/22/2011    Orientation RESPIRATION BLADDER Height & Weight     Self, Situation  Normal Incontinent, External catheter Weight: 149 lb 14.6 oz (68 kg) Height:  '5\' 3"'$  (160 cm)  BEHAVIORAL SYMPTOMS/MOOD NEUROLOGICAL BOWEL NUTRITION STATUS      Continent Diet (See dc summary)  AMBULATORY STATUS COMMUNICATION OF NEEDS Skin   Extensive Assist Verbally Normal                       Personal Care Assistance Level of Assistance  Bathing, Feeding, Dressing Bathing Assistance:  Limited assistance Feeding assistance: Limited assistance Dressing Assistance: Limited assistance     Functional Limitations Info  Sight Sight Info: Impaired        SPECIAL CARE FACTORS FREQUENCY  PT (By licensed PT), OT (By licensed OT)     PT Frequency: 5x/week OT Frequency: 5x/week            Contractures Contractures Info: Not present    Additional Factors Info  Code Status, Allergies Code Status Info: Full Allergies Info: Fluticasone, Aciphex (Rabeprazole), Codeine, Codeine, Cymbalta (Duloxetine Hcl), Lexapro (Escitalopram), Lipitor (Atorvastatin Calcium), Pneumovax (Pneumococcal Polysaccharide Vaccine), Prevacid (Lansoprazole), Rosuvastatin, Sertraline, Statins, Sulfa Antibiotics, Tetracyclines & Related, Welchol (Colesevelam), Pneumovax 23 (Pneumococcal Vac Polyvalent)           Current Medications (08/09/2022):  This is the current hospital active medication list Current Facility-Administered Medications  Medication Dose Route Frequency Provider Last Rate Last Admin   0.9 %  sodium chloride infusion   Intravenous Continuous Ghimire, Henreitta Leber, MD 10 mL/hr at 08/08/22 1004 Rate Change at 08/08/22 1004   acetaminophen (TYLENOL) tablet 650 mg  650 mg Oral Q6H PRN Toy Baker, MD   650 mg at 08/09/22 0605   Or   acetaminophen (TYLENOL) suppository 650 mg  650 mg Rectal Q6H PRN Doutova, Nyoka Lint, MD       busPIRone (BUSPAR) tablet 10 mg  10 mg Oral BID Toy Baker, MD   10 mg at 08/09/22 0920   clopidogrel (PLAVIX) tablet 75 mg  75 mg Oral Daily Toy Baker, MD  75 mg at 08/09/22 0920   donepezil (ARICEPT) tablet 10 mg  10 mg Oral Daily Doutova, Anastassia, MD   10 mg at 08/09/22 0920   enoxaparin (LOVENOX) injection 40 mg  40 mg Subcutaneous Q24H Jonetta Osgood, MD   40 mg at 08/09/22 0920   gabapentin (NEURONTIN) capsule 200 mg  200 mg Oral BID Toy Baker, MD   200 mg at 08/09/22 0920   HYDROcodone-acetaminophen (NORCO/VICODIN)  5-325 MG per tablet 1-2 tablet  1-2 tablet Oral Q4H PRN Toy Baker, MD       levothyroxine (SYNTHROID) tablet 88 mcg  88 mcg Oral QAC breakfast Doutova, Nyoka Lint, MD   88 mcg at 08/09/22 0920   melatonin tablet 3 mg  3 mg Oral QHS Doutova, Anastassia, MD   3 mg at 08/08/22 2138   propranolol (INDERAL) tablet 20 mg  20 mg Oral BID Jonetta Osgood, MD   20 mg at 08/09/22 0919   rosuvastatin (CRESTOR) tablet 5 mg  5 mg Oral Daily Toy Baker, MD   5 mg at 08/09/22 0920     Discharge Medications: Please see discharge summary for a list of discharge medications.  Relevant Imaging Results:  Relevant Lab Results:   Additional Information SSN: [id number]. Pt is vaccinated for covid with one booster.  Benard Halsted, LCSW

## 2022-08-09 NOTE — TOC Transition Note (Signed)
Transition of Care [facility]) - CM/SW Discharge Note   Patient Details  Name: Bianca Garcia MRN: [id number] Date of Birth: June 18, 1941  [facility] CM/SW Contact:  Benard Halsted, [place] Phone Number: 08/09/2022, 2:03 PM   Clinical Narrative:    Patient will DC to: [facility] Anticipated DC date: 08/09/22 Family notified: Son Transport by: Corey Harold   Per MD patient ready for DC to [employer] RN to call report prior to discharge [phone]). RN, patient, patient's family, and facility notified of DC. Discharge Summary and FL2 sent to facility. DC packet on chart including DNR. Ambulance transport requested for patient.   CSW will sign off for now as social work intervention is no longer needed. Please consult [name] again if new needs arise.     Final next level of care: Skilled Nursing Facility Barriers to Discharge: Barriers Resolved   Patient Goals and CMS Choice Patient states their goals for this hospitalization and ongoing recovery are:: Rehab CMS Medicare.gov Compare Post Acute Care list provided to:: Patient Represenative (must comment) Choice offered to / list presented to : Adult Children  Discharge Placement   Existing PASRR number confirmed : 08/09/22          Patient chooses bed at: [facility] Patient to be transferred to facility by: [place] Name of family member notified: Son Patient and family notified of of transfer: 08/09/22  Discharge Plan and Services In-house Referral: Clinical Social Work                                   Social Determinants of Health (SDOH) Interventions     Readmission Risk Interventions     No data to display

## 2022-08-09 NOTE — Discharge Summary (Addendum)
PATIENT DETAILS Name: Bianca Garcia Age: 81 y.o. Sex: female Date of Birth: 07/07/1941 MRN: [id number]. Admitting Physician: Jonetta Osgood, MD GUR:KYHCW, Rene Kocher, MD  Admit Date: 08/07/2022 Discharge date: 08/09/2022  Recommendations for Outpatient Follow-up:  Follow up with PCP in 1-2 weeks Please obtain CMP/CBC in one week  Admitted From:  Home  Disposition: Skilled nursing facility   Discharge Condition: good  CODE STATUS:   Code Status: DNR   Diet recommendation:  Diet Order             Diet - low sodium heart healthy           Diet Heart Room service appropriate? Yes; Fluid consistency: Thin  Diet effective now                    Brief Summary: Patient is a 81 y.o.  female with history of dementia, CVA, HTN, HLD-presented with syncopal episode.  See below for further details.   Significant events: 11/11>> admit to [facility]-syncope.   Significant studies: 11/11>> CT head: No acute abnormalities. 11/11>> MRI brain: No acute intracranial abnormality. 11/11>> CT angio: No PE. 11/12>> Spot EEG: No seizures 11/13>> Echo: EF 60-65%   Significant microbiology data: 11/11>> COVID PCR: Negative.   Procedures: None   Consults: Neurology  Brief Hospital Course: Syncope Likely vasovagal Telemetry negative for arrhythmias Neuroimaging negative/EEG negative Echo with stable EF   Twitching episodes Not felt to be seizures EEG negative MRI brain negative Seems to have resolved. Per neurology-likely flare of underlying essential tremor due to acute illness/stress. No further recommendations from neurology   Dementia Mild delirium Maintain delirium precautions Continue Aricept/Namenda   History of CVA Continue antiplatelet/statin Nonfocal exam   HLD Continue Crestor   Hypothyroidism Continue Synthroid   Anxiety Continue BuSpar   Essential tremor Supportive care Propranolol initially held due to bradycardia, but resumed at a  lower dose-heart rate stable in the low 60s range.    BMI: Estimated body mass index is 26.56 kg/m as calculated from the following:   Height as of this encounter: '5\' 3"'$  (1.6 m).   Weight as of this encounter: 68 kg.     Discharge Diagnoses:  Principal Problem:   Syncope Active Problems:   Essential hypertension   Hyperlipidemia   Hypothyroidism   Coronary artery calcification   Anxiety   Tremor   History of stroke   Orthostatic hypotension   Vascular dementia without behavioral disturbance [facility])   Discharge Instructions:  Activity:  As tolerated with Full fall precautions use walker/cane & assistance as needed   Discharge Instructions     Diet - low sodium heart healthy   Complete by: As directed    Discharge instructions   Complete by: As directed    Follow with Primary MD  Virgie Dad, MD in 1-2 weeks  Please get a complete blood count and chemistry panel checked by your Primary MD at your next visit, and again as instructed by your Primary MD.  Get Medicines reviewed and adjusted: Please take all your medications with you for your next visit with your Primary MD  Laboratory/radiological data: Please request your Primary MD to go over all hospital tests and procedure/radiological results at the follow up, please ask your Primary MD to get all Hospital records sent to his/her office.  In some cases, they will be blood work, cultures and biopsy results pending at the time of your discharge. Please request that your primary care M.D.  follows up on these results.  Also Note the following: If you experience worsening of your admission symptoms, develop shortness of breath, life threatening emergency, suicidal or homicidal thoughts you must seek medical attention immediately by calling 911 or calling your MD immediately  if symptoms less severe.  You must read complete instructions/literature along with all the possible adverse reactions/side effects for all the  Medicines you take and that have been prescribed to you. Take any new Medicines after you have completely understood and accpet all the possible adverse reactions/side effects.   Do not drive when taking Pain medications or sleeping medications (Benzodaizepines)  Do not take more than prescribed Pain, Sleep and Anxiety Medications. It is not advisable to combine anxiety,sleep and pain medications without talking with your primary care practitioner  Special Instructions: If you have smoked or chewed Tobacco  in the last 2 yrs please stop smoking, stop any regular Alcohol  and or any Recreational drug use.  Wear Seat belts while driving.  Please note: You were cared for by a hospitalist during your hospital stay. Once you are discharged, your primary care physician will handle any further medical issues. Please note that NO REFILLS for any discharge medications will be authorized once you are discharged, as it is imperative that you return to your primary care physician (or establish a relationship with a primary care physician if you do not have one) for your post hospital discharge needs so that they can reassess your need for medications and monitor your lab values.   Increase activity slowly   Complete by: As directed       Allergies as of 08/09/2022       Reactions   Fluticasone Other (See Comments)   DRY EYES   Aciphex [rabeprazole] Other (See Comments)   Critical    Codeine Nausea Only   Codeine Other (See Comments)   unknown   Cymbalta [duloxetine Hcl] Other (See Comments)   Critical   Lexapro [escitalopram] Other (See Comments)   critical   Lipitor [atorvastatin Calcium] Other (See Comments)   Intolerant    Pneumovax [pneumococcal Polysaccharide Vaccine] Other (See Comments)   moderate   Prevacid [lansoprazole] Other (See Comments)   Unknown, listed on MAR   Rosuvastatin Other (See Comments)   Intolerant    Sertraline Other (See Comments)   critical   Statins Other  (See Comments)   Critical   Sulfa Antibiotics Nausea And Vomiting   Tetracyclines & Related Other (See Comments)   Morphine Sulfate   Welchol [colesevelam] Other (See Comments)   Unknown, listed on MAR   Pneumovax 23 [pneumococcal Vac Polyvalent] Rash   Injection site reaction        Medication List     TAKE these medications    acetaminophen 325 MG tablet Commonly known as: TYLENOL Take 650 mg by mouth See admin instructions. Take 650 mg 3 times daily, may take 650 mg 2 times daily as needed for pain   busPIRone 10 MG tablet Commonly known as: BUSPAR Take 1 tablet (10 mg total) by mouth 2 (two) times daily.   CALCIUM 500 + D PO Take 1 tablet by mouth daily.   calcium carbonate 500 MG chewable tablet Commonly known as: TUMS - dosed in mg elemental calcium Chew 2 tablets by mouth 2 (two) times daily as needed for indigestion or heartburn.   clonazePAM 0.5 MG tablet Commonly known as: KLONOPIN Take 0.5 tablets (0.25 mg total) by mouth 2 (two) times daily as  needed (for anxiety and shakiness).   clopidogrel 75 MG tablet Commonly known as: PLAVIX Take 1 tablet (75 mg total) by mouth daily.   donepezil 10 MG tablet Commonly known as: ARICEPT Take 1 tablet (10 mg total) by mouth daily.   gabapentin 100 MG capsule Commonly known as: NEURONTIN Take 200 mg by mouth 3 (three) times daily.   levothyroxine 88 MCG tablet Commonly known as: SYNTHROID Take 88 mcg by mouth daily before breakfast.   meclizine 12.5 MG tablet Commonly known as: ANTIVERT Take 12.5 mg by mouth 2 (two) times daily.   melatonin 5 MG Tabs Take 5 mg by mouth at bedtime as needed (sleep).   memantine 5 MG tablet Commonly known as: NAMENDA Take 5 mg by mouth 2 (two) times daily.   mirabegron ER 25 MG Tb24 tablet Commonly known as: Myrbetriq Take 1 tablet (25 mg total) by mouth daily.   omeprazole 40 MG capsule Commonly known as: PRILOSEC Take 40 mg by mouth daily.   PRESERVISION AREDS  PO Take 1 tablet by mouth 2 (two) times daily.   propranolol 40 MG tablet Commonly known as: INDERAL Take 0.5 tablets (20 mg total) by mouth 2 (two) times daily. What changed: how much to take   rosuvastatin 5 MG tablet Commonly known as: CRESTOR Take 1 tablet (5 mg total) by mouth daily.   Voltaren 1 % Gel Generic drug: diclofenac Sodium Apply 2 g topically 2 (two) times daily as needed (pain).        Contact information for after-discharge care     Destination     [facility] .   Service: Skilled Nursing Contact information: [place] [address] [phone]                    Allergies  Allergen Reactions   Fluticasone Other (See Comments)    DRY EYES   Aciphex [Rabeprazole] Other (See Comments)    Critical    Codeine Nausea Only   Codeine Other (See Comments)    unknown   Cymbalta [Duloxetine Hcl] Other (See Comments)    Critical    Lexapro [Escitalopram] Other (See Comments)    critical   Lipitor [Atorvastatin Calcium] Other (See Comments)    Intolerant    Pneumovax [Pneumococcal Polysaccharide Vaccine] Other (See Comments)    moderate   Prevacid [Lansoprazole] Other (See Comments)    Unknown, listed on MAR   Rosuvastatin Other (See Comments)    Intolerant    Sertraline Other (See Comments)    critical   Statins Other (See Comments)    Critical    Sulfa Antibiotics Nausea And Vomiting   Tetracyclines & Related Other (See Comments)    Morphine Sulfate   Welchol [Colesevelam] Other (See Comments)    Unknown, listed on MAR   Pneumovax 23 [Pneumococcal Vac Polyvalent] Rash    Injection site reaction     Other Procedures/Studies: ECHOCARDIOGRAM COMPLETE  Result Date: 08/09/2022    ECHOCARDIOGRAM REPORT   Patient Name:   Bianca Garcia Date of Exam: 08/09/2022 Medical Rec #:  [id number]        Height:       63.0 in Accession #:    [id number]       Weight:       149.9 lb  Date of Birth:  [birth year]         BSA:          1.711 m Patient  Age:    64 years         BP:           144/58 mmHg Patient Gender: F                HR:           57 bpm. Exam Location:  Inpatient Procedure: 2D Echo Indications:    syncope  History:        Patient has prior history of Echocardiogram examinations, most                 recent 09/21/2017. Stroke; Risk Factors:Hypertension and                 Dyslipidemia.  Sonographer:    Harvie Junior Referring Phys: Ruth  1. Left ventricular ejection fraction, by estimation, is 60 to 65%. The left ventricle has normal function. The left ventricle has no regional wall motion abnormalities. Left ventricular diastolic parameters were normal.  2. Right ventricular systolic function is normal. The right ventricular size is normal. There is normal pulmonary artery systolic pressure.  3. The mitral valve is normal in structure. No evidence of mitral valve regurgitation. No evidence of mitral stenosis.  4. The aortic valve is tricuspid. There is mild calcification of the aortic valve. There is mild thickening of the aortic valve. Aortic valve regurgitation is trivial. Aortic valve sclerosis is present, with no evidence of aortic valve stenosis.  5. The inferior vena cava is normal in size with greater than 50% respiratory variability, suggesting right atrial pressure of 3 mmHg. FINDINGS  Left Ventricle: Left ventricular ejection fraction, by estimation, is 60 to 65%. The left ventricle has normal function. The left ventricle has no regional wall motion abnormalities. The left ventricular internal cavity size was normal in size. There is  no left ventricular hypertrophy. Left ventricular diastolic parameters were normal. Right Ventricle: The right ventricular size is normal. No increase in right ventricular wall thickness. Right ventricular systolic function is normal. There is normal pulmonary artery systolic pressure. The tricuspid regurgitant  velocity is 2.75 m/s, and  with an assumed right atrial pressure of 3 mmHg, the estimated right ventricular systolic pressure is 36.1 mmHg. Left Atrium: Left atrial size was normal in size. Right Atrium: Right atrial size was normal in size. Pericardium: Trivial pericardial effusion is present. The pericardial effusion is posterior to the left ventricle. Mitral Valve: The mitral valve is normal in structure. No evidence of mitral valve regurgitation. No evidence of mitral valve stenosis. Tricuspid Valve: The tricuspid valve is normal in structure. Tricuspid valve regurgitation is mild . No evidence of tricuspid stenosis. Aortic Valve: The aortic valve is tricuspid. There is mild calcification of the aortic valve. There is mild thickening of the aortic valve. Aortic valve regurgitation is trivial. Aortic regurgitation PHT measures 719 msec. Aortic valve sclerosis is present, with no evidence of aortic valve stenosis. Aortic valve mean gradient measures 4.0 mmHg. Aortic valve peak gradient measures 6.8 mmHg. Aortic valve area, by VTI measures 2.45 cm. Pulmonic Valve: The pulmonic valve was normal in structure. Pulmonic valve regurgitation is not visualized. No evidence of pulmonic stenosis. Aorta: The aortic root is normal in size and structure. Venous: The inferior vena cava is normal in size with greater than 50% respiratory variability, suggesting right atrial pressure of 3 mmHg. IAS/Shunts: No atrial level shunt detected by color flow Doppler.  LEFT VENTRICLE PLAX 2D LVIDd:  3.70 cm     Diastology LVIDs:         2.50 cm     LV e' medial:    6.09 cm/s LV PW:         0.80 cm     LV E/e' medial:  10.8 LV IVS:        0.80 cm     LV e' lateral:   5.87 cm/s LVOT diam:     1.80 cm     LV E/e' lateral: 11.2 LV SV:         71 LV SV Index:   41 LVOT Area:     2.54 cm  LV Volumes (MOD) LV vol d, MOD A2C: 55.9 ml LV vol d, MOD A4C: 56.8 ml LV vol s, MOD A2C: 25.6 ml LV vol s, MOD A4C: 22.9 ml LV SV MOD A2C:     30.3  ml LV SV MOD A4C:     56.8 ml LV SV MOD BP:      33.0 ml RIGHT VENTRICLE RV Basal diam:  2.90 cm RV Mid diam:    2.90 cm RV S prime:     12.30 cm/s TAPSE (M-mode): 1.4 cm LEFT ATRIUM           Index        RIGHT ATRIUM          Index LA diam:      2.70 cm 1.58 cm/m   RA Area:     8.39 cm LA Vol (A4C): 33.0 ml 19.29 ml/m  RA Volume:   16.30 ml 9.53 ml/m  AORTIC VALVE                    PULMONIC VALVE AV Area (Vmax):    2.60 cm     PV Vmax:       0.85 m/s AV Area (Vmean):   2.19 cm     PV Peak grad:  2.9 mmHg AV Area (VTI):     2.45 cm AV Vmax:           130.00 cm/s AV Vmean:          85.000 cm/s AV VTI:            0.289 m AV Peak Grad:      6.8 mmHg AV Mean Grad:      4.0 mmHg LVOT Vmax:         133.00 cm/s LVOT Vmean:        73.200 cm/s LVOT VTI:          0.278 m LVOT/AV VTI ratio: 0.96 AI PHT:            719 msec  AORTA Ao Root diam: 3.20 cm Ao Asc diam:  2.90 cm MITRAL VALVE               TRICUSPID VALVE MV Area (PHT): 2.83 cm    TR Peak grad:   30.2 mmHg MV Decel Time: 268 msec    TR Vmax:        275.00 cm/s MV E velocity: 66.00 cm/s MV A velocity: 72.30 cm/s  SHUNTS MV E/A ratio:  0.91        Systemic VTI:  0.28 m                            Systemic Diam: 1.80 cm Jenkins Rouge MD Electronically signed by Jenkins Rouge MD Signature  Date/Time: 08/09/2022/11:49:15 AM    Final    EEG adult  Result Date: 08/08/2022 Lora Havens, MD     08/08/2022  8:57 AM Patient Name: Bianca Garcia MRN: [id number] Epilepsy Attending: Lora Havens Referring Physician/Provider: Tegeler, Gwenyth Allegra, MD Date: 08/07/2022 Duration: 22.33 mins Patient history: 81yo F with syncope. EEG to evaluate for seizure Level of alertness: Awake AEDs during EEG study: GBP Technical aspects: This EEG study was done with scalp electrodes positioned according to the 10-20 International system of electrode placement. Electrical activity was reviewed with band pass filter of 1-'70Hz'$ , sensitivity of 7 uV/mm, display speed of 55m/sec  with a '60Hz'$  notched filter applied as appropriate. EEG data were recorded continuously and digitally stored.  Video monitoring was available and reviewed as appropriate. Description: The posterior dominant rhythm consists of 9 Hz activity of moderate voltage (25-35 uV) seen predominantly in posterior head regions, symmetric and reactive to eye opening and eye closing. There is intermittent generalized 15 to 18 Hz beta activity. Hyperventilation and photic stimulation were not performed.   IMPRESSION: This study is within normal limits. No seizures or epileptiform discharges were seen throughout the recording. PLora Havens  CT Angio Chest PE W and/or Wo Contrast  Result Date: 08/07/2022 CLINICAL DATA:  Chest pain or SOB, pleurisy or effusion suspected EXAM: CT ANGIOGRAPHY CHEST WITH CONTRAST TECHNIQUE: Multidetector CT imaging of the chest was performed using the standard protocol during bolus administration of intravenous contrast. Multiplanar CT image reconstructions and MIPs were obtained to evaluate the vascular anatomy. RADIATION DOSE REDUCTION: This exam was performed according to the departmental dose-optimization program which includes automated exposure control, adjustment of the mA and/or kV according to patient size and/or use of iterative reconstruction technique. CONTRAST:  754mOMNIPAQUE IOHEXOL 350 MG/ML SOLN COMPARISON:  Chest x-ray 08/07/2022 FINDINGS: Cardiovascular: Satisfactory opacification of the pulmonary arteries to the segmental level. No evidence of pulmonary embolism. The main pulmonary artery is normal in caliber. Normal heart size. No pericardial effusion. Mild atherosclerotic plaque. At least 2 vessel coronary calcification. Mediastinum/Nodes: No enlarged mediastinal, hilar, or axillary lymph nodes. Thyroid gland, trachea, and esophagus demonstrate no significant findings. Lungs/Pleura: Bilateral lower lobe atelectasis. Biapical pleural/pulmonary scarring. No focal  consolidation. No pulmonary nodule. No pulmonary mass. No pleural effusion. No pneumothorax. Upper Abdomen: No acute abnormality. Musculoskeletal: No chest wall abnormality. No suspicious lytic or blastic osseous lesions. No acute displaced fracture. Anterior cervical discectomy and fusion of the C5 through C7 levels. Grade 1 anterolisthesis C7 on T1. Multilevel degenerative changes of the spine. Degenerative changes of bilateral shoulders. Review of the MIP images confirms the above findings. IMPRESSION: 1. No pulmonary embolus. 2. No acute intrathoracic abnormality. Electronically Signed   By: MoIven Finn.D.   On: 08/07/2022 20:19   MR BRAIN WO CONTRAST  Result Date: 08/07/2022 CLINICAL DATA:  Neuro deficit, acute, stroke suspected Intermittent head shaking that is new. Neurology recommend MRI brain without and EEG. EXAM: MRI HEAD WITHOUT CONTRAST TECHNIQUE: Multiplanar, multiecho pulse sequences of the brain and surrounding structures were obtained without intravenous contrast. COMPARISON:  MRI head December 21 2017. FINDINGS: Brain: No acute infarction, hemorrhage, hydrocephalus, extra-axial collection or mass lesion. Scattered T2/FLAIR hyperintensities the white matter, nonspecific but compatible chronic microvascular ischemic disease. Cerebral atrophy. Vascular: Major arterial flow voids are maintained at the skull base. Skull and upper cervical spine: Normal marrow signal. Sinuses/Orbits: Clear sinuses.  No acute orbital findings. Other: No mastoid effusions IMPRESSION: No evidence of acute intracranial  abnormality. Electronically Signed   By: Margaretha Sheffield M.D.   On: 08/07/2022 17:46   CT HEAD WO CONTRAST (5MM)  Result Date: 08/07/2022 CLINICAL DATA:  Syncope. EXAM: CT HEAD WITHOUT CONTRAST TECHNIQUE: Contiguous axial images were obtained from the base of the skull through the vertex without intravenous contrast. RADIATION DOSE REDUCTION: This exam was performed according to the departmental  dose-optimization program which includes automated exposure control, adjustment of the mA and/or kV according to patient size and/or use of iterative reconstruction technique. COMPARISON:  07/24/2021 FINDINGS: Brain: No evidence of acute infarction, hemorrhage, hydrocephalus, extra-axial collection or mass lesion/mass effect. There is mild diffuse low-attenuation within the subcortical and periventricular white matter compatible with chronic microvascular disease. Prominence of the sulci and ventricles compatible with brain atrophy. Vascular: No hyperdense vessel or unexpected calcification. Skull: Normal. Negative for fracture or focal lesion. Sinuses/Orbits: No acute finding. Other: None. IMPRESSION: 1. No acute intracranial abnormalities. 2. Chronic microvascular disease and brain atrophy. Electronically Signed   By: Kerby Moors M.D.   On: 08/07/2022 12:40   DG Chest 2 View  Result Date: 08/07/2022 CLINICAL DATA:  Syncope EXAM: CHEST - 2 VIEW COMPARISON:  07/24/2021 FINDINGS: The heart size and mediastinal contours are within normal limits. Implantable loop recorder. Mild diffuse bilateral interstitial pulmonary opacity. Disc degenerative disease of the thoracic spine. IMPRESSION: Mild diffuse bilateral interstitial pulmonary opacity, consistent with edema or atypical/viral infection. No focal airspace opacity. Electronically Signed   By: Delanna Ahmadi M.D.   On: 08/07/2022 12:04     TODAY-DAY OF DISCHARGE:  Subjective:   Bianca Garcia today has no headache,no chest abdominal pain,no new weakness tingling or numbness, feels much better wants to go home today.   Objective:   Blood pressure (!) 136/51, pulse 66, temperature 98.5 F (36.9 C), temperature source Oral, resp. rate 16, height '5\' 3"'$  (1.6 m), weight 68 kg, last menstrual period 09/28/1979, SpO2 96 %.  Intake/Output Summary (Last 24 hours) at 08/09/2022 1409 Last data filed at 08/09/2022 0600 Gross per 24 hour  Intake 50 ml   Output --  Net 50 ml   Filed Weights   08/07/22 2131  Weight: 68 kg    Exam: Awake Alert, Oriented *3, No new F.N deficits, Normal affect [place].AT,PERRAL Supple Neck,No JVD, No cervical lymphadenopathy appriciated.  Symmetrical Chest wall movement, Good air movement bilaterally, CTAB RRR,No Gallops,Rubs or new Murmurs, No Parasternal Heave +ve B.Sounds, Abd Soft, Non tender, No organomegaly appriciated, No rebound -guarding or rigidity. No Cyanosis, Clubbing or edema, No new Rash or bruise   PERTINENT RADIOLOGIC STUDIES: ECHOCARDIOGRAM COMPLETE  Result Date: 08/09/2022    ECHOCARDIOGRAM REPORT   Patient Name:   Bianca Garcia Date of Exam: 08/09/2022 Medical Rec #:  [id number]        Height:       63.0 in Accession #:    [id number]       Weight:       149.9 lb Date of Birth:  1941/03/13         BSA:          77.711 m Patient Age:    29 years         BP:           144/58 mmHg Patient Gender: F                HR:           57 bpm. Exam Location:  Inpatient Procedure: 2D Echo  Indications:    syncope  History:        Patient has prior history of Echocardiogram examinations, most                 recent 09/21/2017. Stroke; Risk Factors:Hypertension and                 Dyslipidemia.  Sonographer:    Harvie Junior Referring Phys: [place]  1. Left ventricular ejection fraction, by estimation, is 60 to 65%. The left ventricle has normal function. The left ventricle has no regional wall motion abnormalities. Left ventricular diastolic parameters were normal.  2. Right ventricular systolic function is normal. The right ventricular size is normal. There is normal pulmonary artery systolic pressure.  3. The mitral valve is normal in structure. No evidence of mitral valve regurgitation. No evidence of mitral stenosis.  4. The aortic valve is tricuspid. There is mild calcification of the aortic valve. There is mild thickening of the aortic valve. Aortic valve regurgitation is trivial.  Aortic valve sclerosis is present, with no evidence of aortic valve stenosis.  5. The inferior vena cava is normal in size with greater than 50% respiratory variability, suggesting right atrial pressure of 3 mmHg. FINDINGS  Left Ventricle: Left ventricular ejection fraction, by estimation, is 60 to 65%. The left ventricle has normal function. The left ventricle has no regional wall motion abnormalities. The left ventricular internal cavity size was normal in size. There is  no left ventricular hypertrophy. Left ventricular diastolic parameters were normal. Right Ventricle: The right ventricular size is normal. No increase in right ventricular wall thickness. Right ventricular systolic function is normal. There is normal pulmonary artery systolic pressure. The tricuspid regurgitant velocity is 2.75 m/s, and  with an assumed right atrial pressure of 3 mmHg, the estimated right ventricular systolic pressure is 60.1 mmHg. Left Atrium: Left atrial size was normal in size. Right Atrium: Right atrial size was normal in size. Pericardium: Trivial pericardial effusion is present. The pericardial effusion is posterior to the left ventricle. Mitral Valve: The mitral valve is normal in structure. No evidence of mitral valve regurgitation. No evidence of mitral valve stenosis. Tricuspid Valve: The tricuspid valve is normal in structure. Tricuspid valve regurgitation is mild . No evidence of tricuspid stenosis. Aortic Valve: The aortic valve is tricuspid. There is mild calcification of the aortic valve. There is mild thickening of the aortic valve. Aortic valve regurgitation is trivial. Aortic regurgitation PHT measures 719 msec. Aortic valve sclerosis is present, with no evidence of aortic valve stenosis. Aortic valve mean gradient measures 4.0 mmHg. Aortic valve peak gradient measures 6.8 mmHg. Aortic valve area, by VTI measures 2.45 cm. Pulmonic Valve: The pulmonic valve was normal in structure. Pulmonic valve regurgitation  is not visualized. No evidence of pulmonic stenosis. Aorta: The aortic root is normal in size and structure. Venous: The inferior vena cava is normal in size with greater than 50% respiratory variability, suggesting right atrial pressure of 3 mmHg. IAS/Shunts: No atrial level shunt detected by color flow Doppler.  LEFT VENTRICLE PLAX 2D LVIDd:         3.70 cm     Diastology LVIDs:         2.50 cm     LV e' medial:    6.09 cm/s LV PW:         0.80 cm     LV E/e' medial:  10.8 LV IVS:        0.80 cm  LV e' lateral:   5.87 cm/s LVOT diam:     1.80 cm     LV E/e' lateral: 11.2 LV SV:         71 LV SV Index:   41 LVOT Area:     2.54 cm  LV Volumes (MOD) LV vol d, MOD A2C: 55.9 ml LV vol d, MOD A4C: 56.8 ml LV vol s, MOD A2C: 25.6 ml LV vol s, MOD A4C: 22.9 ml LV SV MOD A2C:     30.3 ml LV SV MOD A4C:     56.8 ml LV SV MOD BP:      33.0 ml RIGHT VENTRICLE RV Basal diam:  2.90 cm RV Mid diam:    2.90 cm RV S prime:     12.30 cm/s TAPSE (M-mode): 1.4 cm LEFT ATRIUM           Index        RIGHT ATRIUM          Index LA diam:      2.70 cm 1.58 cm/m   RA Area:     8.39 cm LA Vol (A4C): 33.0 ml 19.29 ml/m  RA Volume:   16.30 ml 9.53 ml/m  AORTIC VALVE                    PULMONIC VALVE AV Area (Vmax):    2.60 cm     PV Vmax:       0.85 m/s AV Area (Vmean):   2.19 cm     PV Peak grad:  2.9 mmHg AV Area (VTI):     2.45 cm AV Vmax:           130.00 cm/s AV Vmean:          85.000 cm/s AV VTI:            0.289 m AV Peak Grad:      6.8 mmHg AV Mean Grad:      4.0 mmHg LVOT Vmax:         133.00 cm/s LVOT Vmean:        73.200 cm/s LVOT VTI:          0.278 m LVOT/AV VTI ratio: 0.96 AI PHT:            719 msec  AORTA Ao Root diam: 3.20 cm Ao Asc diam:  2.90 cm MITRAL VALVE               TRICUSPID VALVE MV Area (PHT): 2.83 cm    TR Peak grad:   30.2 mmHg MV Decel Time: 268 msec    TR Vmax:        275.00 cm/s MV E velocity: 66.00 cm/s MV A velocity: 72.30 cm/s  SHUNTS MV E/A ratio:  0.91        Systemic VTI:  0.28 m                             Systemic Diam: 1.80 cm Jenkins Rouge MD Electronically signed by Jenkins Rouge MD Signature Date/Time: 08/09/2022/11:49:15 AM    Final    EEG adult  Result Date: 08/08/2022 Lora Havens, MD     08/08/2022  8:57 AM Patient Name: Bianca Garcia MRN: [id number] Epilepsy Attending: Lora Havens Referring Physician/Provider: Tegeler, Gwenyth Allegra, MD Date: 08/07/2022 Duration: 22.33 mins Patient history: 81yo F with syncope. EEG to evaluate for seizure Level of alertness: Awake  AEDs during EEG study: GBP Technical aspects: This EEG study was done with scalp electrodes positioned according to the 10-20 International system of electrode placement. Electrical activity was reviewed with band pass filter of 1-'70Hz'$ , sensitivity of 7 uV/mm, display speed of 56m/sec with a '60Hz'$  notched filter applied as appropriate. EEG data were recorded continuously and digitally stored.  Video monitoring was available and reviewed as appropriate. Description: The posterior dominant rhythm consists of 9 Hz activity of moderate voltage (25-35 uV) seen predominantly in posterior head regions, symmetric and reactive to eye opening and eye closing. There is intermittent generalized 15 to 18 Hz beta activity. Hyperventilation and photic stimulation were not performed.   IMPRESSION: This study is within normal limits. No seizures or epileptiform discharges were seen throughout the recording. PLora Havens  CT Angio Chest PE W and/or Wo Contrast  Result Date: 08/07/2022 CLINICAL DATA:  Chest pain or SOB, pleurisy or effusion suspected EXAM: CT ANGIOGRAPHY CHEST WITH CONTRAST TECHNIQUE: Multidetector CT imaging of the chest was performed using the standard protocol during bolus administration of intravenous contrast. Multiplanar CT image reconstructions and MIPs were obtained to evaluate the vascular anatomy. RADIATION DOSE REDUCTION: This exam was performed according to the departmental dose-optimization program  which includes automated exposure control, adjustment of the mA and/or kV according to patient size and/or use of iterative reconstruction technique. CONTRAST:  764mOMNIPAQUE IOHEXOL 350 MG/ML SOLN COMPARISON:  Chest x-ray 08/07/2022 FINDINGS: Cardiovascular: Satisfactory opacification of the pulmonary arteries to the segmental level. No evidence of pulmonary embolism. The main pulmonary artery is normal in caliber. Normal heart size. No pericardial effusion. Mild atherosclerotic plaque. At least 2 vessel coronary calcification. Mediastinum/Nodes: No enlarged mediastinal, hilar, or axillary lymph nodes. Thyroid gland, trachea, and esophagus demonstrate no significant findings. Lungs/Pleura: Bilateral lower lobe atelectasis. Biapical pleural/pulmonary scarring. No focal consolidation. No pulmonary nodule. No pulmonary mass. No pleural effusion. No pneumothorax. Upper Abdomen: No acute abnormality. Musculoskeletal: No chest wall abnormality. No suspicious lytic or blastic osseous lesions. No acute displaced fracture. Anterior cervical discectomy and fusion of the C5 through C7 levels. Grade 1 anterolisthesis C7 on T1. Multilevel degenerative changes of the spine. Degenerative changes of bilateral shoulders. Review of the MIP images confirms the above findings. IMPRESSION: 1. No pulmonary embolus. 2. No acute intrathoracic abnormality. Electronically Signed   By: MoIven Finn.D.   On: 08/07/2022 20:19   MR BRAIN WO CONTRAST  Result Date: 08/07/2022 CLINICAL DATA:  Neuro deficit, acute, stroke suspected Intermittent head shaking that is new. Neurology recommend MRI brain without and EEG. EXAM: MRI HEAD WITHOUT CONTRAST TECHNIQUE: Multiplanar, multiecho pulse sequences of the brain and surrounding structures were obtained without intravenous contrast. COMPARISON:  MRI head December 21 2017. FINDINGS: Brain: No acute infarction, hemorrhage, hydrocephalus, extra-axial collection or mass lesion. Scattered T2/FLAIR  hyperintensities the white matter, nonspecific but compatible chronic microvascular ischemic disease. Cerebral atrophy. Vascular: Major arterial flow voids are maintained at the skull base. Skull and upper cervical spine: Normal marrow signal. Sinuses/Orbits: Clear sinuses.  No acute orbital findings. Other: No mastoid effusions IMPRESSION: No evidence of acute intracranial abnormality. Electronically Signed   By: FrMargaretha Sheffield.D.   On: 08/07/2022 17:46     PERTINENT LAB RESULTS: CBC: Recent Labs    08/07/22 1122 08/07/22 2343  WBC 9.9  --   HGB 14.1 12.6  HCT 43.4 37.0  PLT 301  --    CMET CMP     Component Value Date/Time   NA  142 08/07/2022 2343   NA 142 07/20/2022 0000   K 3.5 08/07/2022 2343   CL 104 08/07/2022 1122   CO2 29 08/07/2022 1122   GLUCOSE 99 08/07/2022 1122   BUN 14 08/07/2022 1122   BUN 11 07/20/2022 0000   CREATININE 0.95 08/07/2022 1122   CREATININE 0.72 06/03/2016 0905   CALCIUM 9.2 08/07/2022 1122   PROT 6.8 08/07/2022 1615   PROT 7.2 08/01/2017 1507   ALBUMIN 3.6 08/07/2022 1615   AST 20 08/07/2022 1615   ALT 14 08/07/2022 1615   ALKPHOS 69 08/07/2022 1615   BILITOT 0.5 08/07/2022 1615   GFRNONAA 60 (L) 08/07/2022 1122   GFRAA >90 08/06/2021 0000    GFR Estimated Creatinine Clearance: 42.2 mL/min (by C-G formula based on SCr of 0.95 mg/dL). No results for input(s): "LIPASE", "AMYLASE" in the last 72 hours. Recent Labs    08/07/22 1615  CKTOTAL 57   Invalid input(s): "POCBNP" No results for input(s): "DDIMER" in the last 72 hours. No results for input(s): "HGBA1C" in the last 72 hours. No results for input(s): "CHOL", "HDL", "LDLCALC", "TRIG", "CHOLHDL", "LDLDIRECT" in the last 72 hours. Recent Labs    08/07/22 1615  TSH 1.147   No results for input(s): "VITAMINB12", "FOLATE", "FERRITIN", "TIBC", "IRON", "RETICCTPCT" in the last 72 hours. Coags: No results for input(s): "INR" in the last 72 hours.  Invalid input(s):  "PT" Microbiology: Recent Results (from the past 240 hour(s))  SARS Coronavirus 2 by RT PCR (hospital order, performed in [facility] hospital lab) *cepheid single result test* Anterior Nasal Swab     Status: None   Collection Time: 08/07/22  9:44 PM   Specimen: Anterior Nasal Swab  Result Value Ref Range Status   SARS Coronavirus 2 by RT PCR NEGATIVE NEGATIVE Final    Comment: (NOTE) SARS-CoV-2 target nucleic acids are NOT DETECTED.  The SARS-CoV-2 RNA is generally detectable in upper and lower respiratory specimens during the acute phase of infection. The lowest concentration of SARS-CoV-2 viral copies this assay can detect is 250 copies / mL. A negative result does not preclude SARS-CoV-2 infection and should not be used as the sole basis for treatment or other patient management decisions.  A negative result may occur with improper specimen collection / handling, submission of specimen other than nasopharyngeal swab, presence of viral mutation(s) within the areas targeted by this assay, and inadequate number of viral copies (<250 copies / mL). A negative result must be combined with clinical observations, patient history, and epidemiological information.  Fact Sheet for Patients:   https://www.patel.info/  Fact Sheet for Healthcare Providers: https://hall.com/  This test is not yet approved or  cleared by the [place] FDA and has been authorized for detection and/or diagnosis of SARS-CoV-2 by FDA under an Emergency Use Authorization (EUA).  This EUA will remain in effect (meaning this test can be used) for the duration of the COVID-19 declaration under Section 564(b)(1) of the Act, 21 U.S.C. section 360bbb-3(b)(1), unless the authorization is terminated or revoked sooner.  Performed at [facility], [place] [address], [place], [place] [address]     FURTHER DISCHARGE INSTRUCTIONS:  Get Medicines reviewed and  adjusted: Please take all your medications with you for your next visit with your Primary MD  Laboratory/radiological data: Please request your Primary MD to go over all hospital tests and procedure/radiological results at the follow up, please ask your Primary MD to get all Hospital records sent to his/her office.  In some cases,  they will be blood work, cultures and biopsy results pending at the time of your discharge. Please request that your primary care M.D. goes through all the records of your hospital data and follows up on these results.  Also Note the following: If you experience worsening of your admission symptoms, develop shortness of breath, life threatening emergency, suicidal or homicidal thoughts you must seek medical attention immediately by calling 911 or calling your MD immediately  if symptoms less severe.  You must read complete instructions/literature along with all the possible adverse reactions/side effects for all the Medicines you take and that have been prescribed to you. Take any new Medicines after you have completely understood and accpet all the possible adverse reactions/side effects.   Do not drive when taking Pain medications or sleeping medications (Benzodaizepines)  Do not take more than prescribed Pain, Sleep and Anxiety Medications. It is not advisable to combine anxiety,sleep and pain medications without talking with your primary care practitioner  Special Instructions: If you have smoked or chewed Tobacco  in the last 2 yrs please stop smoking, stop any regular Alcohol  and or any Recreational drug use.  Wear Seat belts while driving.  Please note: You were cared for by a hospitalist during your hospital stay. Once you are discharged, your primary care physician will handle any further medical issues. Please note that NO REFILLS for any discharge medications will be authorized once you are discharged, as it is imperative that you return to your primary  care physician (or establish a relationship with a primary care physician if you do not have one) for your post hospital discharge needs so that they can reassess your need for medications and monitor your lab values.  Total Time spent coordinating discharge including counseling, education and face to face time equals greater than 30 minutes.  SignedOren Binet 08/09/2022 2:09 PM

## 2022-08-09 NOTE — Progress Notes (Signed)
  Echocardiogram 2D Echocardiogram has been performed.  Bianca Garcia 08/09/2022, 11:42 AM

## 2022-08-09 NOTE — Progress Notes (Signed)
Mobility Specialist Progress Note:   08/09/22 1106  Mobility  Activity Ambulated with assistance in room  Level of Assistance Minimal assist, patient does 75% or more  Assistive Device Front wheel walker  Distance Ambulated (ft) 20 ft  Activity Response Tolerated fair  Mobility Referral Yes  $Mobility charge 1 Mobility   Pt received in bed and agreeable. Posterior lean with sitting and standing, requiring MinA to keep upright. C/o back pain and discomfort. Pt left in bed with all needs met, call bell in reach, and ECHO in room.   Andrey Campanile Mobility Specialist Please contact via SecureChat or  Rehab office at [phone]

## 2022-08-09 NOTE — TOC Initial Note (Addendum)
Transition of Care [facility]) - Initial/Assessment Note    Patient Details  Name: Bianca Garcia MRN: [id number] Date of Birth: 1940-12-10  Transition of Care [facility] CM/SW Contact:    Benard Halsted, LCSW Phone Number: 08/09/2022, 8:57 AM  Clinical Narrative:                 8:57am-Per MD, patient likely able to discharge today. CSW spoke with Butch Penny at [facility] and she confirmed patient resides at their ALF with her spouse and that patient's son is her Media planner. She stated they do have a bed on their rehab unit if family desires it. CSW left him a voicemail to confirm discharge plan.   9:28 AM-CSW received return call from patient's son. He reported agreement with SNF rehab at [place] for transport.     Barriers to Discharge: Continued Medical Work up   Patient Goals and CMS Choice Patient states their goals for this hospitalization and ongoing recovery are:: Rehab CMS Medicare.gov Compare Post Acute Care list provided to:: Patient Represenative (must comment) Choice offered to / list presented to : Adult Children  Expected Discharge Plan and Services   In-house Referral: Clinical Social Work     Living arrangements for the past 2 months: [place]                                      Prior Living Arrangements/Services Living arrangements for the past 2 months: [place] Lives with:: Spouse Patient language and need for interpreter reviewed:: Yes Do you feel safe going back to the place where you live?: Yes      Need for Family Participation in Patient Care: Yes (Comment) Care giver support system in place?: Yes (comment)   Criminal Activity/Legal Involvement Pertinent to Current Situation/Hospitalization: No - Comment as needed  Activities of Daily Living      Permission Sought/Granted Permission sought to share information with : Facility Sport and exercise psychologist, Family Supports Permission granted to share  information with : Yes, Verbal Permission Granted  Share Information with NAME: Dr. Ladell Pier.  Permission granted to share info w AGENCY: [facility]  Permission granted to share info w Relationship: Son  Permission granted to share info w Contact Information: [phone]  Emotional Assessment Appearance:: Appears stated age Attitude/Demeanor/Rapport: Unable to Assess Affect (typically observed): Unable to Assess Orientation: : Oriented to Self, Oriented to Place Alcohol / Substance Use: Not Applicable Psych Involvement: No (comment)  Admission diagnosis:  Syncope and collapse [R55] Syncope [R55] Twitching [R25.3] Patient Active Problem List   Diagnosis Date Noted   Syncope 08/07/2022   Overactive bladder 11/23/2021   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   Vascular dementia without behavioral disturbance ([place]) 05/18/2021   Orthostatic hypotension 01/19/2018   History of stroke 09/20/2017   Stroke (cerebrum) ([place]) 09/20/2017   Paresthesia 08/01/2017   Tremor 08/01/2017   Anxiety 02/27/2016   IBS (irritable bowel syndrome) 02/27/2016   Coronary artery calcification 02/20/2016   Hypothyroidism 06/09/2015   Right rotator cuff tear 01/05/2013   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

## 2022-08-09 NOTE — Progress Notes (Signed)
   08/09/22 1513  AVS Discharge Documentation  AVS Discharge Instructions Including Medications Provided to patient/caregiver  Name of Person Receiving AVS Discharge Instructions Including Medications Crystal, LPN at [facility] [phone]  Name of Clinician That Reviewed AVS Discharge Instructions Including Medications Venida Jarvis, RN

## 2022-08-10 ENCOUNTER — Encounter: Payer: Self-pay | Admitting: Internal Medicine

## 2022-08-10 ENCOUNTER — Non-Acute Institutional Stay (SKILLED_NURSING_FACILITY): Payer: Medicare Other | Admitting: Internal Medicine

## 2022-08-10 DIAGNOSIS — I10 Essential (primary) hypertension: Secondary | ICD-10-CM

## 2022-08-10 DIAGNOSIS — F419 Anxiety disorder, unspecified: Secondary | ICD-10-CM

## 2022-08-10 DIAGNOSIS — F0150 Vascular dementia without behavioral disturbance: Secondary | ICD-10-CM | POA: Diagnosis not present

## 2022-08-10 DIAGNOSIS — E782 Mixed hyperlipidemia: Secondary | ICD-10-CM

## 2022-08-10 DIAGNOSIS — M6289 Other specified disorders of muscle: Secondary | ICD-10-CM | POA: Diagnosis not present

## 2022-08-10 DIAGNOSIS — R55 Syncope and collapse: Secondary | ICD-10-CM

## 2022-08-10 DIAGNOSIS — F01B0 Vascular dementia, moderate, without behavioral disturbance, psychotic disturbance, mood disturbance, and anxiety: Secondary | ICD-10-CM | POA: Diagnosis not present

## 2022-08-10 DIAGNOSIS — R200 Anesthesia of skin: Secondary | ICD-10-CM

## 2022-08-10 DIAGNOSIS — N3946 Mixed incontinence: Secondary | ICD-10-CM | POA: Diagnosis not present

## 2022-08-10 DIAGNOSIS — M5441 Lumbago with sciatica, right side: Secondary | ICD-10-CM | POA: Diagnosis not present

## 2022-08-10 DIAGNOSIS — E039 Hypothyroidism, unspecified: Secondary | ICD-10-CM

## 2022-08-10 DIAGNOSIS — M5442 Lumbago with sciatica, left side: Secondary | ICD-10-CM

## 2022-08-10 DIAGNOSIS — R42 Dizziness and giddiness: Secondary | ICD-10-CM | POA: Diagnosis not present

## 2022-08-10 DIAGNOSIS — G8929 Other chronic pain: Secondary | ICD-10-CM

## 2022-08-10 DIAGNOSIS — R251 Tremor, unspecified: Secondary | ICD-10-CM | POA: Diagnosis not present

## 2022-08-10 NOTE — Progress Notes (Signed)
Provider:   Location:  [facility] Room Number: 151/A Place of Service:  SNF (31)  PCP: Virgie Dad, MD Patient Care Team: Virgie Dad, MD as PCP - General (Internal Medicine) Nahser, Wonda Cheng, MD as PCP - Cardiology (Cardiology) Clarene Essex, MD as Consulting Physician (Gastroenterology) Marcial Pacas, MD as Consulting Physician (Neurology) Kristeen Miss, MD as Consulting Physician (Neurosurgery) Martinique, Amy, MD as Consulting Physician (Dermatology) Megan Salon, MD as Consulting Physician (Gynecology) [place], Heather, Ericson (Optometry) Virgie Dad, MD (Internal Medicine)  Extended Emergency Contact Information Primary Emergency Contact: Masih,Robert J Address: [address]          [place], [place] [address] [name] of [place] Phone: [phone] Work Phone: [phone] Mobile Phone: [phone] Relation: Spouse Secondary Emergency Contact: Quitman Livings of [place] Phone: [phone] Mobile Phone: [phone] Relation: Son  Code Status: DNR Goals of Care: Advanced Directive information    08/10/2022   10:38 AM  Advanced Directives  Does Patient Have a Medical Advance Directive? Yes  Type of Advance Directive Out of facility DNR (pink MOST or yellow form);Living will  Does patient want to make changes to medical advance directive? No - Patient declined  Copy of [place]ey[place] in Chart? No - copy requested      Chief Complaint  Patient presents with   New Admit To SNF    New Admission to SNF    HPI: Patient is a 81 y.o. female seen today for admission to SNF for Syncope Admitted for Syncopal episode  Usually she is in [place] with her husband Per nurses was sitting eating her breakfast when she did not feel good and then passed put ? Jerking But got better when made to lie down in her room Send to ED  Furhter testing including CT scan and MRI were negative for any Acute  process just showed Chronic Vascular changes CTA of chest negative for PE  Syncope thought to be Vasovagal Her Propanolol was reduced due to bradycardia  Also Continues with her basic Dizziness complain Was started on low-dose of meclizine and BuSpar.  Not sure if  help with her dizziness. Today patient states that she is like she  floating and any position change makes her dizzy. 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 Street of back injections by Dr. Brien Few 2016 with good relief, history of cervical DD with possiblity of surgery by Dr. Trenton Gammon.   GERD (gastroesophageal reflux disease)    IBS with moderate refluc seen on upper GI study form January 2011 Dr. Watt Climes   HTN (hypertension)    no meds   Hyperlipidemia    Hypothyroidism    IBS (irritable bowel syndrome)    Imbalance    Impingement syndrome of right shoulder 01/05/2013   Mass of left side of neck  Memory loss    Mood disorder ([place])    MVP (mitral valve prolapse)    Hx of    Osteopenia    Peripheral edema    Right rotator cuff tear 01/05/2013   Sciatic pain    recurrent   Seasonal allergies    Stroke [facility])    Tension headache    TIA (transient ischemic attack)    Tremor    Past Surgical History:  Procedure Laterality Date   ANTERIOR CERVICAL DECOMP/DISCECTOMY FUSION  04/2015    Dr. Pamala Hurry, [place]   2004   which revealed smooth and normal coronary arteries   CARPAL TUNNEL RELEASE  2000   lt   CATARACT EXTRACTION W/ INTRAOCULAR LENS  IMPLANT, BILATERAL  1/17   Dr. Lucita Ferrara   COSMETIC SURGERY  2010   eyes-facial   DENTAL SURGERY  1960   LASIK     LOOP RECORDER INSERTION N/A 09/22/2017   Procedure: LOOP RECORDER INSERTION;  Surgeon: Constance Haw, MD;  Location: [facility] CV LAB;  Service: Cardiovascular;  Laterality: N/A;   LUMBAR LAMINECTOMY/DECOMPRESSION MICRODISCECTOMY N/A 06/08/2021   Procedure: Laminectomy and Foraminotomy - L4-L5, L5-S1;  Surgeon: Earnie Larsson, MD;  Location: [facility];  Service: Neurosurgery;  Laterality: N/A;   SHOULDER ARTHROSCOPY WITH ROTATOR CUFF REPAIR Right 01/05/2013   Procedure: SHOULDER ARTHROSCOPY WITH ARTHROSCOPIC  ROTATOR CUFF REPAIR, ACROMIOPLASTY, EXTENSIVE DEBRIDEMENT;  Surgeon: Johnny Bridge, MD;  Location: [facility];  Service: Orthopedics;  Laterality: Right;   TEE WITHOUT CARDIOVERSION N/A 09/22/2017   Procedure: TRANSESOPHAGEAL ECHOCARDIOGRAM (TEE);  Surgeon: Sanda Klein, MD;  Location: [facility];  Service: Cardiovascular;  Laterality: N/A;   TONSILLECTOMY     TUBAL LIGATION      reports that she quit smoking about 62 years ago. Her smoking use included cigarettes. She has never used smokeless tobacco. She reports that she does not currently use alcohol. She reports that she does not use drugs. Social History   Socioeconomic History   Marital status: Married    Spouse name: Not on file   Number of children: 2   Years of education: 16 years   Highest education level: Bachelor's degree (e.g., BA, AB, BS)  Occupational History   Occupation: Retired  Tobacco Use   Smoking status: Former    Types: Cigarettes    Quit date: 01/05/1960    Years since quitting: 62.6   Smokeless tobacco: Never   Tobacco comments:    Remote Hx    Patient smoked in high school   Vaping Use   Vaping Use: Never used  Substance and  Sexual Activity   Alcohol use: Not Currently    Comment: rare   Drug use: No   Sexual activity: Not Currently    Birth control/protection: Post-menopausal  Other Topics Concern   Not on file  Social History Narrative   ** Merged History Encounter **       Lives at home with her husband. Left-handed. Occasional caffeine use.   Social Determinants of Health   Financial Resource Strain: Not on file  Food Insecurity: Not on file  Transportation Needs: Not on file  Physical Activity: Not on file  Stress: Not on file  Social Connections: Not on file  Intimate Partner Violence: Not on file    Functional Status Survey:    Family History  Problem Relation Age of Onset   Bladder Cancer Father    Mitral valve prolapse Mother    Cancer  Maternal Grandfather     Health Maintenance  Topic Date Due   COVID-19 Vaccine (4 - Janssen risk series) 09/30/2021   TETANUS/TDAP  08/18/2022   Medicare Annual Wellness (AWV)  02/05/2023   Pneumonia Vaccine 60+ Years old  Completed   INFLUENZA VACCINE  Completed   DEXA SCAN  Completed   Zoster Vaccines- Shingrix  Completed   HPV VACCINES  Aged Out    Allergies  Allergen Reactions   Fluticasone Other (See Comments)    DRY EYES   Aciphex [Rabeprazole] Other (See Comments)    Critical    Codeine Nausea Only   Codeine Other (See Comments)    unknown   Cymbalta [Duloxetine Hcl] Other (See Comments)    Critical    Lexapro [Escitalopram] Other (See Comments)    critical   Lipitor [Atorvastatin Calcium] Other (See Comments)    Intolerant    Morphine Sulfate     Per matrix   Pneumovax [Pneumococcal Polysaccharide Vaccine] Other (See Comments)    moderate   Prevacid [Lansoprazole] Other (See Comments)    Unknown, listed on MAR   Rosuvastatin Other (See Comments)    Intolerant    Sertraline Other (See Comments)    critical   Statins Other (See Comments)    Critical    Sulfa Antibiotics Nausea And Vomiting   Tetracyclines &  Related Other (See Comments)    Morphine Sulfate   Welchol [Colesevelam] Other (See Comments)    Unknown, listed on MAR   Pneumovax 23 [Pneumococcal Vac Polyvalent] Rash    Injection site reaction    Outpatient Encounter Medications as of 08/10/2022  Medication Sig   acetaminophen (TYLENOL) 325 MG tablet Take 650 mg by mouth See admin instructions. Take 650 mg 3 times daily, may take 650 mg 2 times daily as needed for pain   busPIRone (BUSPAR) 10 MG tablet Take 1 tablet (10 mg total) by mouth 2 (two) times daily.   Calcium Carb-Cholecalciferol (CALCIUM 500 + D PO) Take 1 tablet by mouth daily.   calcium carbonate (TUMS - DOSED IN MG ELEMENTAL CALCIUM) 500 MG chewable tablet Chew 2 tablets by mouth 2 (two) times daily as needed for indigestion or heartburn.   clonazePAM (KLONOPIN) 0.5 MG tablet Take 0.5 tablets (0.25 mg total) by mouth 2 (two) times daily as needed (for anxiety and shakiness).   clopidogrel (PLAVIX) 75 MG tablet Take 1 tablet (75 mg total) by mouth daily. (Patient taking differently: Take 75 mg by mouth in the morning.)   diclofenac Sodium (VOLTAREN) 1 % GEL Apply 2 g topically 2 (two) times daily as needed (pain).   donepezil (ARICEPT) 10 MG tablet Take 1 tablet (10 mg total) by mouth daily.   gabapentin (NEURONTIN) 100 MG capsule Take 200 mg by mouth 3 (three) times daily.   levothyroxine (SYNTHROID) 88 MCG tablet Take 88 mcg by mouth daily before breakfast.   meclizine (ANTIVERT) 12.5 MG tablet Take 12.5 mg by mouth 2 (two) times daily.   melatonin 5 MG TABS Take 5 mg by mouth at bedtime as needed (sleep).   memantine (NAMENDA) 5 MG tablet Take 5 mg by mouth 2 (two) times daily.   mirabegron ER (MYRBETRIQ) 25 MG TB24 tablet Take 1 tablet (25 mg total) by mouth daily.   Multiple Vitamins-Minerals (PRESERVISION AREDS PO) Take 1 tablet by mouth 2 (two) times daily.    omeprazole (PRILOSEC) 40 MG capsule Take 40 mg by mouth daily.   propranolol (INDERAL) 20 MG tablet Take  20  mg by mouth 2 (two) times daily. 1 tablet (20 mg) oral, Twice A Day   propranolol (INDERAL) 40 MG tablet Take 0.5 tablets (20 mg total) by mouth 2 (two) times daily.   rosuvastatin (CRESTOR) 5 MG tablet Take 1 tablet (5 mg total) by mouth daily.   No facility-administered encounter medications on file as of 08/10/2022.    Review of Systems  Constitutional:  Positive for activity change. Negative for appetite change.  HENT: Negative.    Respiratory:  Negative for cough and shortness of breath.   Cardiovascular:  Negative for leg swelling.  Gastrointestinal:  Negative for constipation.  Genitourinary: Negative.   Musculoskeletal:  Positive for gait problem. Negative for arthralgias and myalgias.  Skin: Negative.   Neurological:  Positive for dizziness. Negative for weakness.  Psychiatric/Behavioral:  Positive for confusion. Negative for dysphoric mood and sleep disturbance.     Vitals:   08/10/22 1013  BP: 119/73  Pulse: (!) 59  Resp: 16  Temp: 98.2 F (36.8 C)  TempSrc: Temporal  SpO2: 93%  Weight: 148 lb (67.1 kg)  Height: '5\' 1"'$  (1.549 m)   Body mass index is 27.96 kg/m. Physical Exam Vitals reviewed.  Constitutional:      Appearance: Normal appearance.  HENT:     Head: Normocephalic.     Nose: Nose normal.     Mouth/Throat:     Mouth: Mucous membranes are moist.     Pharynx: Oropharynx is clear.  Eyes:     Pupils: Pupils are equal, round, and reactive to light.  Cardiovascular:     Rate and Rhythm: Normal rate and regular rhythm.     Pulses: Normal pulses.     Heart sounds: Normal heart sounds. No murmur heard. Pulmonary:     Effort: Pulmonary effort is normal.     Breath sounds: Normal breath sounds.  Abdominal:     General: Abdomen is flat. Bowel sounds are normal.     Palpations: Abdomen is soft.  Musculoskeletal:        General: No swelling.     Cervical back: Neck supple.  Skin:    General: Skin is warm.  Neurological:     General: No focal deficit  present.     Mental Status: She is alert.     Comments: C/o Feeling head swimming with position change  Psychiatric:        Mood and Affect: Mood normal.        Thought Content: Thought content normal.    Labs reviewed: Basic Metabolic Panel: Recent Labs    03/23/22 0000 07/20/22 0000 08/07/22 1122 08/07/22 1615 08/07/22 2343  NA 142 142 141  --  142  K 4.3 4.2 4.2  --  3.5  CL 105 106 104  --   --   CO2 24* 26* 29  --   --   GLUCOSE  --   --  99  --   --   BUN '12 11 14  '$ --   --   CREATININE 0.7 0.7 0.95  --   --   CALCIUM 9.4 9.3 9.2  --   --   MG  --   --   --  2.2  --   PHOS  --   --   --  4.0  --    Liver Function Tests: Recent Labs    07/20/22 0000 08/07/22 1615  AST 20 20  ALT 20 14  ALKPHOS 84 69  BILITOT  --  0.5  PROT  --  6.8  ALBUMIN 3.9 3.6   No results for input(s): "LIPASE", "AMYLASE" in the last 8760 hours. No results for input(s): "AMMONIA" in the last 8760 hours. CBC: Recent Labs    03/22/22 0000 07/20/22 0000 08/07/22 1122 08/07/22 2343  WBC 5.9 5.8 9.9  --   NEUTROABS  --   --  7.5  --   HGB 13.6 14.5 14.1 12.6  HCT 41 42 43.4 37.0  MCV  --   --  98.9  --   PLT 238 224 301  --    Cardiac Enzymes: Recent Labs    08/07/22 1615  CKTOTAL 38   BNP: Invalid input(s): "POCBNP" Lab Results  Component Value Date   HGBA1C 5.6 09/21/2017   Lab Results  Component Value Date   TSH 1.147 08/07/2022   Lab Results  Component Value Date   VITAMINB12 708 08/01/2017   Lab Results  Component Value Date   FOLATE 17.8 08/01/2017   Lab Results  Component Value Date   FERRITIN 141 08/01/2017    Imaging and Procedures obtained prior to SNF admission: EEG adult  Result Date: 08/08/2022 Lora Havens, MD     08/08/2022  8:57 AM Patient Name: LINDSEE LABARRE MRN: [id number] Epilepsy Attending: Lora Havens Referring Physician/Provider: Tegeler, Gwenyth Allegra, MD Date: 08/07/2022 Duration: 22.33 mins Patient history: 81yo F with  syncope. EEG to evaluate for seizure Level of alertness: Awake AEDs during EEG study: GBP Technical aspects: This EEG study was done with scalp electrodes positioned according to the 10-20 International system of electrode placement. Electrical activity was reviewed with band pass filter of 1-'70Hz'$ , sensitivity of 7 uV/mm, display speed of 65m/sec with a '60Hz'$  notched filter applied as appropriate. EEG data were recorded continuously and digitally stored.  Video monitoring was available and reviewed as appropriate. Description: The posterior dominant rhythm consists of 9 Hz activity of moderate voltage (25-35 uV) seen predominantly in posterior head regions, symmetric and reactive to eye opening and eye closing. There is intermittent generalized 15 to 18 Hz beta activity. Hyperventilation and photic stimulation were not performed.   IMPRESSION: This study is within normal limits. No seizures or epileptiform discharges were seen throughout the recording. PLora Havens  CT Angio Chest PE W and/or Wo Contrast  Result Date: 08/07/2022 CLINICAL DATA:  Chest pain or SOB, pleurisy or effusion suspected EXAM: CT ANGIOGRAPHY CHEST WITH CONTRAST TECHNIQUE: Multidetector CT imaging of the chest was performed using the standard protocol during bolus administration of intravenous contrast. Multiplanar CT image reconstructions and MIPs were obtained to evaluate the vascular anatomy. RADIATION DOSE REDUCTION: This exam was performed according to the departmental dose-optimization program which includes automated exposure control, adjustment of the mA and/or kV according to patient size and/or use of iterative reconstruction technique. CONTRAST:  774mOMNIPAQUE IOHEXOL 350 MG/ML SOLN COMPARISON:  Chest x-ray 08/07/2022 FINDINGS: Cardiovascular: Satisfactory opacification of the pulmonary arteries to the segmental level. No evidence of pulmonary embolism. The main pulmonary artery is normal in caliber. Normal heart size. No  pericardial effusion. Mild atherosclerotic plaque. At least 2 vessel coronary calcification. Mediastinum/Nodes: No enlarged mediastinal, hilar, or axillary lymph nodes. Thyroid gland, trachea, and esophagus demonstrate no significant findings. Lungs/Pleura: Bilateral lower lobe atelectasis. Biapical pleural/pulmonary scarring. No focal consolidation. No pulmonary nodule. No pulmonary mass. No pleural effusion. No pneumothorax. Upper Abdomen: No acute abnormality. Musculoskeletal: No chest wall abnormality. No suspicious lytic or blastic osseous lesions. No acute displaced fracture.  Anterior cervical discectomy and fusion of the C5 through C7 levels. Grade 1 anterolisthesis C7 on T1. Multilevel degenerative changes of the spine. Degenerative changes of bilateral shoulders. Review of the MIP images confirms the above findings. IMPRESSION: 1. No pulmonary embolus. 2. No acute intrathoracic abnormality. Electronically Signed   By: Iven Finn M.D.   On: 08/07/2022 20:19   MR BRAIN WO CONTRAST  Result Date: 08/07/2022 CLINICAL DATA:  Neuro deficit, acute, stroke suspected Intermittent head shaking that is new. Neurology recommend MRI brain without and EEG. EXAM: MRI HEAD WITHOUT CONTRAST TECHNIQUE: Multiplanar, multiecho pulse sequences of the brain and surrounding structures were obtained without intravenous contrast. COMPARISON:  MRI head December 21 2017. FINDINGS: Brain: No acute infarction, hemorrhage, hydrocephalus, extra-axial collection or mass lesion. Scattered T2/FLAIR hyperintensities the white matter, nonspecific but compatible chronic microvascular ischemic disease. Cerebral atrophy. Vascular: Major arterial flow voids are maintained at the skull base. Skull and upper cervical spine: Normal marrow signal. Sinuses/Orbits: Clear sinuses.  No acute orbital findings. Other: No mastoid effusions IMPRESSION: No evidence of acute intracranial abnormality. Electronically Signed   By: Margaretha Sheffield M.D.    On: 08/07/2022 17:46   CT HEAD WO CONTRAST (5MM)  Result Date: 08/07/2022 CLINICAL DATA:  Syncope. EXAM: CT HEAD WITHOUT CONTRAST TECHNIQUE: Contiguous axial images were obtained from the base of the skull through the vertex without intravenous contrast. RADIATION DOSE REDUCTION: This exam was performed according to the departmental dose-optimization program which includes automated exposure control, adjustment of the mA and/or kV according to patient size and/or use of iterative reconstruction technique. COMPARISON:  07/24/2021 FINDINGS: Brain: No evidence of acute infarction, hemorrhage, hydrocephalus, extra-axial collection or mass lesion/mass effect. There is mild diffuse low-attenuation within the subcortical and periventricular white matter compatible with chronic microvascular disease. Prominence of the sulci and ventricles compatible with brain atrophy. Vascular: No hyperdense vessel or unexpected calcification. Skull: Normal. Negative for fracture or focal lesion. Sinuses/Orbits: No acute finding. Other: None. IMPRESSION: 1. No acute intracranial abnormalities. 2. Chronic microvascular disease and brain atrophy. Electronically Signed   By: Kerby Moors M.D.   On: 08/07/2022 12:40   DG Chest 2 View  Result Date: 08/07/2022 CLINICAL DATA:  Syncope EXAM: CHEST - 2 VIEW COMPARISON:  07/24/2021 FINDINGS: The heart size and mediastinal contours are within normal limits. Implantable loop recorder. Mild diffuse bilateral interstitial pulmonary opacity. Disc degenerative disease of the thoracic spine. IMPRESSION: Mild diffuse bilateral interstitial pulmonary opacity, consistent with edema or atypical/viral infection. No focal airspace opacity. Electronically Signed   By: Delanna Ahmadi M.D.   On: 08/07/2022 12:04    Assessment/Plan 1. Vasovagal syncope She was not orthostatic  2. Vertigo Therapy to evaluate Change Meclizine to 25 mg BID for now Hold Buspar   3. Facial numbness Resolved per  patient  MRI was negative  4. Chronic bilateral low back pain with bilateral sciatica Continue Tylenol  Had failed Lidocaine patches and Refused them also Per Dr Trenton Gammon Conservative management  Also on Neurontin Does not tolerate higher doses 5. Vascular dementia without behavioral disturbance (HCC) In [place] On Aricept and Namenda Also on Plavix and statin  6. Essential hypertension No Meds Except Propanolol  7. Mixed hyperlipidemia On Crestor  8. Mixed stress and urge urinary incontinence Myrbetriq  9. Acquired hypothyroidism TSH normal in 11/23  10. Anxiety Just Klonipin   11. Tremor Back on Propanolol lower dose    Family/ staff Communication:   Labs/tests ordered:

## 2022-08-11 DIAGNOSIS — F01B0 Vascular dementia, moderate, without behavioral disturbance, psychotic disturbance, mood disturbance, and anxiety: Secondary | ICD-10-CM | POA: Diagnosis not present

## 2022-08-11 DIAGNOSIS — R2689 Other abnormalities of gait and mobility: Secondary | ICD-10-CM | POA: Diagnosis not present

## 2022-08-11 DIAGNOSIS — Z8673 Personal history of transient ischemic attack (TIA), and cerebral infarction without residual deficits: Secondary | ICD-10-CM | POA: Diagnosis not present

## 2022-08-11 DIAGNOSIS — M6289 Other specified disorders of muscle: Secondary | ICD-10-CM | POA: Diagnosis not present

## 2022-08-11 DIAGNOSIS — R278 Other lack of coordination: Secondary | ICD-10-CM | POA: Diagnosis not present

## 2022-08-11 DIAGNOSIS — R55 Syncope and collapse: Secondary | ICD-10-CM | POA: Diagnosis not present

## 2022-08-11 DIAGNOSIS — M5459 Other low back pain: Secondary | ICD-10-CM | POA: Diagnosis not present

## 2022-08-11 DIAGNOSIS — M48062 Spinal stenosis, lumbar region with neurogenic claudication: Secondary | ICD-10-CM | POA: Diagnosis not present

## 2022-08-12 ENCOUNTER — Non-Acute Institutional Stay (SKILLED_NURSING_FACILITY): Payer: Medicare Other | Admitting: Adult Health

## 2022-08-12 ENCOUNTER — Encounter: Payer: Self-pay | Admitting: Adult Health

## 2022-08-12 DIAGNOSIS — E782 Mixed hyperlipidemia: Secondary | ICD-10-CM | POA: Diagnosis not present

## 2022-08-12 DIAGNOSIS — R55 Syncope and collapse: Secondary | ICD-10-CM | POA: Diagnosis not present

## 2022-08-12 DIAGNOSIS — R251 Tremor, unspecified: Secondary | ICD-10-CM | POA: Diagnosis not present

## 2022-08-12 DIAGNOSIS — M48062 Spinal stenosis, lumbar region with neurogenic claudication: Secondary | ICD-10-CM | POA: Diagnosis not present

## 2022-08-12 DIAGNOSIS — R2689 Other abnormalities of gait and mobility: Secondary | ICD-10-CM | POA: Diagnosis not present

## 2022-08-12 DIAGNOSIS — F0150 Vascular dementia without behavioral disturbance: Secondary | ICD-10-CM

## 2022-08-12 DIAGNOSIS — R278 Other lack of coordination: Secondary | ICD-10-CM | POA: Diagnosis not present

## 2022-08-12 DIAGNOSIS — R42 Dizziness and giddiness: Secondary | ICD-10-CM | POA: Diagnosis not present

## 2022-08-12 DIAGNOSIS — Z8673 Personal history of transient ischemic attack (TIA), and cerebral infarction without residual deficits: Secondary | ICD-10-CM | POA: Diagnosis not present

## 2022-08-12 DIAGNOSIS — M6289 Other specified disorders of muscle: Secondary | ICD-10-CM | POA: Diagnosis not present

## 2022-08-12 DIAGNOSIS — F01B0 Vascular dementia, moderate, without behavioral disturbance, psychotic disturbance, mood disturbance, and anxiety: Secondary | ICD-10-CM | POA: Diagnosis not present

## 2022-08-12 DIAGNOSIS — M5459 Other low back pain: Secondary | ICD-10-CM | POA: Diagnosis not present

## 2022-08-12 DIAGNOSIS — F411 Generalized anxiety disorder: Secondary | ICD-10-CM | POA: Diagnosis not present

## 2022-08-12 NOTE — Progress Notes (Signed)
Location:  Occupational psychologist of Service:  SNF (31)  Provider:  Cindi Carbon, [place] [phone]   PCP: Virgie Dad, MD Patient Care Team: Virgie Dad, MD as PCP - General (Internal Medicine) Nahser, Wonda Cheng, MD as PCP - Cardiology (Cardiology) Clarene Essex, MD as Consulting Physician (Gastroenterology) Marcial Pacas, MD as Consulting Physician (Neurology) Kristeen Miss, MD as Consulting Physician (Neurosurgery) Martinique, Amy, MD as Consulting Physician (Dermatology) Megan Salon, MD as Consulting Physician (Gynecology) [place], Heather, [place] (Optometry) Virgie Dad, MD (Internal Medicine)  Extended Emergency Contact Information Primary Emergency Contact: Henne,Robert J Address: [address]          [place], [place] [address] [name] of [place] Phone: [phone] Work Phone: [phone] Mobile Phone: [phone] Relation: Spouse Secondary Emergency Contact: Quitman Livings of [place] Phone: [phone] Mobile Phone: [phone] Relation: Son  Code Status: DNR Goals of care:  Advanced Directive information    08/10/2022   10:38 AM  Advanced Directives  Does Patient Have a Medical Advance Directive? Yes  Type of Advance Directive Out of facility DNR (pink MOST or yellow form);Living will  Does patient want to make changes to medical advance directive? No - Patient declined  Copy of [place] in Chart? No - copy requested     Allergies  Allergen Reactions   Fluticasone Other (See Comments)    DRY EYES   Aciphex [Rabeprazole] Other (See Comments)    Critical    Codeine Nausea Only   Codeine Other (See Comments)    unknown   Cymbalta [Duloxetine Hcl] Other (See Comments)    Critical    Lexapro [Escitalopram] Other (See Comments)    critical   Lipitor [Atorvastatin Calcium] Other (See Comments)    Intolerant    Morphine Sulfate     Per matrix    Pneumovax [Pneumococcal Polysaccharide Vaccine] Other (See Comments)    moderate   Prevacid [Lansoprazole] Other (See Comments)    Unknown, listed on MAR   Rosuvastatin Other (See Comments)    Intolerant    Sertraline Other (See Comments)    critical   Statins Other (See Comments)    Critical    Sulfa Antibiotics Nausea And Vomiting   Tetracyclines & Related Other (See Comments)    Morphine Sulfate   Welchol [Colesevelam] Other (See Comments)    Unknown, listed on MAR   Pneumovax 23 [Pneumococcal Vac Polyvalent] Rash    Injection site reaction    Chief Complaint  Patient presents with   Discharge Note    HPI:  81 y.o. female  seen for discharge from [facility] back to assisted living where she lives with her husband.  PMH significant for anxiety, dementia, HLD, CVA, chronic back pain with sciatica hx of laminectomy, MVP, IBS, hypothyroidism, DJD, tremor.   Admitted to the hospital for syncope 08/07/22-08/09/22. She had an extensive work up, felt to be vasovagal. Did have some twitching episodes that were not felt to be seizures, seen by neurology. Has underlying tremor which may have been exacerbated by stress. Propranolol was initially held due to low HR but was later introduced at a lower dose.   11/11 >> 12 lead EKG bradycardia with no STEMI 11/11>> CT head: No acute abnormalities. 11/11>> MRI brain: No acute intracranial abnormality. 11/11>> CT angio: No PE. 11/12>> Spot EEG: No seizures 11/13>> Echo: EF 60-65%   Of note she was seen in  the clinic 11/7  for facial numbness and twitching along with severe anxiety and started on Bupsar. This was placed on hold on 11/13 for trial off ?s/e.    Also seen in the clinic 11/7 for vertigo and placed on meclizine.   At this time she is back to baseline where she is confused but pleasant. Ambulatory with a walker. Anxious to return to her AL apt.  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 Street of back injections by Dr. Brien Few 2016 with good relief, history of cervical DD with possiblity of surgery by Dr. Trenton Gammon.   GERD (gastroesophageal reflux disease)    IBS with moderate refluc seen on upper GI study form January 2011 Dr. Watt Climes   HTN (hypertension)    no meds   Hyperlipidemia    Hypothyroidism    IBS (irritable bowel syndrome)    Imbalance    Impingement syndrome of right shoulder 01/05/2013   Mass of left side of neck    Memory loss    Mood disorder ([place])    MVP (mitral valve prolapse)    Hx of    Osteopenia    Peripheral edema    Right rotator cuff tear 01/05/2013   Sciatic pain    recurrent   Seasonal allergies    Stroke [facility]    Tension headache    TIA (transient ischemic attack)    Tremor     Past Surgical History:  Procedure Laterality Date   ANTERIOR CERVICAL DECOMP/DISCECTOMY FUSION  04/2015    Dr. Pamala Hurry, [place]  2004   which revealed smooth and normal coronary arteries   CARPAL TUNNEL RELEASE  2000   lt   CATARACT EXTRACTION W/ INTRAOCULAR LENS  IMPLANT, BILATERAL  1/17   Dr. Lucita Ferrara   COSMETIC SURGERY  2010   eyes-facial   DENTAL SURGERY  1960   LASIK     LOOP RECORDER INSERTION N/A 09/22/2017   Procedure: LOOP RECORDER INSERTION;  Surgeon: Constance Haw, MD;  Location: [name];  Service: Cardiovascular;  Laterality: N/A;   LUMBAR LAMINECTOMY/DECOMPRESSION MICRODISCECTOMY N/A 06/08/2021   Procedure: Laminectomy and Foraminotomy - L4-L5, L5-S1;  Surgeon: Earnie Larsson, MD;  Location: [facility];  Service: Neurosurgery;  Laterality: N/A;   SHOULDER ARTHROSCOPY WITH ROTATOR CUFF REPAIR Right 01/05/2013   Procedure: SHOULDER ARTHROSCOPY WITH ARTHROSCOPIC   ROTATOR CUFF REPAIR, ACROMIOPLASTY, EXTENSIVE DEBRIDEMENT;  Surgeon: Johnny Bridge, MD;  Location: [facility];  Service: Orthopedics;  Laterality: Right;   TEE WITHOUT CARDIOVERSION N/A 09/22/2017   Procedure: TRANSESOPHAGEAL ECHOCARDIOGRAM (TEE);  Surgeon: Sanda Klein, MD;  Location: [facility];  Service: Cardiovascular;  Laterality: N/A;   TONSILLECTOMY     TUBAL LIGATION        reports that she quit smoking about 62 years ago. Her smoking use included cigarettes. She has never used smokeless tobacco. She reports that she does not currently use alcohol. She reports that she does not use drugs. Social History   Socioeconomic History   Marital status: Married    Spouse name: Not on file   Number of children: 2   Years of education: 36  years   Highest education level: Bachelor's degree (e.g., BA, AB, BS)  Occupational History   Occupation: Retired  Tobacco Use   Smoking status: Former    Types: Cigarettes    Quit date: 01/05/1960    Years since quitting: 62.6   Smokeless tobacco: Never   Tobacco comments:    Remote Hx    Patient smoked in high school   Vaping Use   Vaping Use: Never used  Substance and Sexual Activity   Alcohol use: Not Currently    Comment: rare   Drug use: No   Sexual activity: Not Currently    Birth control/protection: Post-menopausal  Other Topics Concern   Not on file  Social History Narrative   ** Merged History Encounter **       Lives at home with her husband. Left-handed. Occasional caffeine use.   Social Determinants of Health   Financial Resource Strain: Not on file  Food Insecurity: Not on file  Transportation Needs: Not on file  Physical Activity: Not on file  Stress: Not on file  Social Connections: Not on file  Intimate Partner Violence: Not on file   Functional Status Survey:    Allergies  Allergen Reactions   Fluticasone Other (See Comments)    DRY EYES   Aciphex [Rabeprazole] Other (See Comments)     Critical    Codeine Nausea Only   Codeine Other (See Comments)    unknown   Cymbalta [Duloxetine Hcl] Other (See Comments)    Critical    Lexapro [Escitalopram] Other (See Comments)    critical   Lipitor [Atorvastatin Calcium] Other (See Comments)    Intolerant    Morphine Sulfate     Per matrix   Pneumovax [Pneumococcal Polysaccharide Vaccine] Other (See Comments)    moderate   Prevacid [Lansoprazole] Other (See Comments)    Unknown, listed on MAR   Rosuvastatin Other (See Comments)    Intolerant    Sertraline Other (See Comments)    critical   Statins Other (See Comments)    Critical    Sulfa Antibiotics Nausea And Vomiting   Tetracyclines & Related Other (See Comments)    Morphine Sulfate   Welchol [Colesevelam] Other (See Comments)    Unknown, listed on MAR   Pneumovax 23 [Pneumococcal Vac Polyvalent] Rash    Injection site reaction    Pertinent  Health Maintenance Due  Topic Date Due   INFLUENZA VACCINE  Completed   DEXA SCAN  Completed    Medications: Outpatient Encounter Medications as of 08/12/2022  Medication Sig   acetaminophen (TYLENOL) 325 MG tablet Take 650 mg by mouth See admin instructions. Take 650 mg 3 times daily, may take 650 mg 2 times daily as needed for pain   busPIRone (BUSPAR) 10 MG tablet Take 1 tablet (10 mg total) by mouth 2 (two) times daily.   Calcium Carb-Cholecalciferol (CALCIUM 500 + D PO) Take 1 tablet by mouth daily.   calcium carbonate (TUMS - DOSED IN MG ELEMENTAL CALCIUM) 500 MG chewable tablet Chew 2 tablets by mouth 2 (two) times daily as needed for indigestion or heartburn.   clonazePAM (KLONOPIN) 0.5 MG tablet Take 0.5 tablets (0.25 mg total) by mouth 2 (two) times daily as needed (for anxiety and shakiness).   clopidogrel (PLAVIX) 75 MG tablet Take 1 tablet (75 mg total) by mouth daily. (Patient taking differently: Take 75 mg by mouth in the morning.)   diclofenac Sodium (VOLTAREN) 1 % GEL Apply 2 g topically 2 (two)  times  daily as needed (pain).   donepezil (ARICEPT) 10 MG tablet Take 1 tablet (10 mg total) by mouth daily.   gabapentin (NEURONTIN) 100 MG capsule Take 200 mg by mouth 3 (three) times daily.   levothyroxine (SYNTHROID) 88 MCG tablet Take 88 mcg by mouth daily before breakfast.   meclizine (ANTIVERT) 12.5 MG tablet Take 12.5 mg by mouth 2 (two) times daily.   melatonin 5 MG TABS Take 5 mg by mouth at bedtime as needed (sleep).   memantine (NAMENDA) 5 MG tablet Take 5 mg by mouth 2 (two) times daily.   mirabegron ER (MYRBETRIQ) 25 MG TB24 tablet Take 1 tablet (25 mg total) by mouth daily.   Multiple Vitamins-Minerals (PRESERVISION AREDS PO) Take 1 tablet by mouth 2 (two) times daily.    omeprazole (PRILOSEC) 40 MG capsule Take 40 mg by mouth daily.   propranolol (INDERAL) 20 MG tablet Take 20 mg by mouth 2 (two) times daily. 1 tablet (20 mg) oral, Twice A Day   rosuvastatin (CRESTOR) 5 MG tablet Take 1 tablet (5 mg total) by mouth daily.   [DISCONTINUED] propranolol (INDERAL) 40 MG tablet Take 0.5 tablets (20 mg total) by mouth 2 (two) times daily.   No facility-administered encounter medications on file as of 08/12/2022.    Review of Systems  Constitutional:  Negative for activity change, appetite change, chills, diaphoresis, fatigue, fever and unexpected weight change.  HENT:  Negative for congestion.   Respiratory:  Negative for cough, shortness of breath and wheezing.   Cardiovascular:  Negative for chest pain, palpitations and leg swelling.  Gastrointestinal:  Negative for abdominal distention, abdominal pain, constipation and diarrhea.  Genitourinary:  Negative for difficulty urinating and dysuria.  Musculoskeletal:  Positive for back pain (chronic) and gait problem. Negative for arthralgias, joint swelling and myalgias.  Neurological:  Positive for tremors. Negative for dizziness, seizures, syncope, facial asymmetry, speech difficulty, weakness, light-headedness, numbness and headaches.   Psychiatric/Behavioral:  Positive for confusion. Negative for agitation and behavioral problems. The patient is nervous/anxious.     Vitals:   08/12/22 1155  BP: 110/71  Pulse: 69  Resp: 18  Temp: 98.4 F (36.9 C)  SpO2: 94%   There is no height or weight on file to calculate BMI. Physical Exam Vitals and nursing note reviewed.  Constitutional:      General: She is not in acute distress.    Appearance: She is not diaphoretic.  HENT:     Head: Normocephalic and atraumatic.     Nose: Nose normal.     Mouth/Throat:     Mouth: Mucous membranes are moist.     Pharynx: Oropharynx is clear.  Eyes:     Conjunctiva/sclera: Conjunctivae normal.     Pupils: Pupils are equal, round, and reactive to light.  Neck:     Vascular: No JVD.  Cardiovascular:     Rate and Rhythm: Normal rate and regular rhythm.     Heart sounds: No murmur heard. Pulmonary:     Effort: Pulmonary effort is normal. No respiratory distress.     Breath sounds: Normal breath sounds. No wheezing.  Abdominal:     General: Bowel sounds are normal. There is no distension.     Palpations: Abdomen is soft.     Tenderness: There is no abdominal tenderness.  Musculoskeletal:     Right lower leg: No edema.     Left lower leg: No edema.  Skin:    General: Skin is warm and dry.  Neurological:     General: No focal deficit present.     Mental Status: She is alert. Mental status is at baseline.     Cranial Nerves: No cranial nerve deficit.  Psychiatric:        Mood and Affect: Mood normal.     Labs reviewed: Basic Metabolic Panel: Recent Labs    03/23/22 0000 07/20/22 0000 08/07/22 1122 08/07/22 1615 08/07/22 2343  NA 142 142 141  --  142  K 4.3 4.2 4.2  --  3.5  CL 105 106 104  --   --   CO2 24* 26* 29  --   --   GLUCOSE  --   --  99  --   --   BUN '12 11 14  '$ --   --   CREATININE 0.7 0.7 0.95  --   --   CALCIUM 9.4 9.3 9.2  --   --   MG  --   --   --  2.2  --   PHOS  --   --   --  4.0  --    Liver  Function Tests: Recent Labs    07/20/22 0000 08/07/22 1615  AST 20 20  ALT 20 14  ALKPHOS 84 69  BILITOT  --  0.5  PROT  --  6.8  ALBUMIN 3.9 3.6   No results for input(s): "LIPASE", "AMYLASE" in the last 8760 hours. No results for input(s): "AMMONIA" in the last 8760 hours. CBC: Recent Labs    03/22/22 0000 07/20/22 0000 08/07/22 1122 08/07/22 2343  WBC 5.9 5.8 9.9  --   NEUTROABS  --   --  7.5  --   HGB 13.6 14.5 14.1 12.6  HCT 41 42 43.4 37.0  MCV  --   --  98.9  --   PLT 238 224 301  --    Cardiac Enzymes: Recent Labs    08/07/22 1615  CKTOTAL 38   BNP: Invalid input(s): "POCBNP" CBG: No results for input(s): "GLUCAP" in the last 8760 hours.  Procedures and Imaging Studies During Stay: ECHOCARDIOGRAM COMPLETE  Result Date: 08/09/2022    ECHOCARDIOGRAM REPORT   Patient Name:   YANISSA MICHALSKY Date of Exam: 08/09/2022 Medical Rec #:  [id number]        Height:       63.0 in Accession #:    [id number]       Weight:       149.9 lb Date of Birth:  May 08, 1941         BSA:          79.711 m Patient Age:    81 years         BP:           144/58 mmHg Patient Gender: F                HR:           57 bpm. Exam Location:  Inpatient Procedure: 2D Echo Indications:    syncope  History:        Patient has prior history of Echocardiogram examinations, most                 recent 09/21/2017. Stroke; Risk Factors:Hypertension and                 Dyslipidemia.  Sonographer:    Harvie Junior Referring Phys: [place]  1. Left ventricular ejection fraction, by estimation, is  60 to 65%. The left ventricle has normal function. The left ventricle has no regional wall motion abnormalities. Left ventricular diastolic parameters were normal.  2. Right ventricular systolic function is normal. The right ventricular size is normal. There is normal pulmonary artery systolic pressure.  3. The mitral valve is normal in structure. No evidence of mitral valve regurgitation. No  evidence of mitral stenosis.  4. The aortic valve is tricuspid. There is mild calcification of the aortic valve. There is mild thickening of the aortic valve. Aortic valve regurgitation is trivial. Aortic valve sclerosis is present, with no evidence of aortic valve stenosis.  5. The inferior vena cava is normal in size with greater than 50% respiratory variability, suggesting right atrial pressure of 3 mmHg. FINDINGS  Left Ventricle: Left ventricular ejection fraction, by estimation, is 60 to 65%. The left ventricle has normal function. The left ventricle has no regional wall motion abnormalities. The left ventricular internal cavity size was normal in size. There is  no left ventricular hypertrophy. Left ventricular diastolic parameters were normal. Right Ventricle: The right ventricular size is normal. No increase in right ventricular wall thickness. Right ventricular systolic function is normal. There is normal pulmonary artery systolic pressure. The tricuspid regurgitant velocity is 2.75 m/s, and  with an assumed right atrial pressure of 3 mmHg, the estimated right ventricular systolic pressure is 16.1 mmHg. Left Atrium: Left atrial size was normal in size. Right Atrium: Right atrial size was normal in size. Pericardium: Trivial pericardial effusion is present. The pericardial effusion is posterior to the left ventricle. Mitral Valve: The mitral valve is normal in structure. No evidence of mitral valve regurgitation. No evidence of mitral valve stenosis. Tricuspid Valve: The tricuspid valve is normal in structure. Tricuspid valve regurgitation is mild . No evidence of tricuspid stenosis. Aortic Valve: The aortic valve is tricuspid. There is mild calcification of the aortic valve. There is mild thickening of the aortic valve. Aortic valve regurgitation is trivial. Aortic regurgitation PHT measures 719 msec. Aortic valve sclerosis is present, with no evidence of aortic valve stenosis. Aortic valve mean gradient  measures 4.0 mmHg. Aortic valve peak gradient measures 6.8 mmHg. Aortic valve area, by VTI measures 2.45 cm. Pulmonic Valve: The pulmonic valve was normal in structure. Pulmonic valve regurgitation is not visualized. No evidence of pulmonic stenosis. Aorta: The aortic root is normal in size and structure. Venous: The inferior vena cava is normal in size with greater than 50% respiratory variability, suggesting right atrial pressure of 3 mmHg. IAS/Shunts: No atrial level shunt detected by color flow Doppler.  LEFT VENTRICLE PLAX 2D LVIDd:         3.70 cm     Diastology LVIDs:         2.50 cm     LV e' medial:    6.09 cm/s LV PW:         0.80 cm     LV E/e' medial:  10.8 LV IVS:        0.80 cm     LV e' lateral:   5.87 cm/s LVOT diam:     1.80 cm     LV E/e' lateral: 11.2 LV SV:         71 LV SV Index:   41 LVOT Area:     2.54 cm  LV Volumes (MOD) LV vol d, MOD A2C: 55.9 ml LV vol d, MOD A4C: 56.8 ml LV vol s, MOD A2C: 25.6 ml LV vol s, MOD A4C: 22.9  ml LV SV MOD A2C:     30.3 ml LV SV MOD A4C:     56.8 ml LV SV MOD BP:      33.0 ml RIGHT VENTRICLE RV Basal diam:  2.90 cm RV Mid diam:    2.90 cm RV S prime:     12.30 cm/s TAPSE (M-mode): 1.4 cm LEFT ATRIUM           Index        RIGHT ATRIUM          Index LA diam:      2.70 cm 1.58 cm/m   RA Area:     8.39 cm LA Vol (A4C): 33.0 ml 19.29 ml/m  RA Volume:   16.30 ml 9.53 ml/m  AORTIC VALVE                    PULMONIC VALVE AV Area (Vmax):    2.60 cm     PV Vmax:       0.85 m/s AV Area (Vmean):   2.19 cm     PV Peak grad:  2.9 mmHg AV Area (VTI):     2.45 cm AV Vmax:           130.00 cm/s AV Vmean:          85.000 cm/s AV VTI:            0.289 m AV Peak Grad:      6.8 mmHg AV Mean Grad:      4.0 mmHg LVOT Vmax:         133.00 cm/s LVOT Vmean:        73.200 cm/s LVOT VTI:          0.278 m LVOT/AV VTI ratio: 0.96 AI PHT:            719 msec  AORTA Ao Root diam: 3.20 cm Ao Asc diam:  2.90 cm MITRAL VALVE               TRICUSPID VALVE MV Area (PHT): 2.83 cm    TR  Peak grad:   30.2 mmHg MV Decel Time: 268 msec    TR Vmax:        275.00 cm/s MV E velocity: 66.00 cm/s MV A velocity: 72.30 cm/s  SHUNTS MV E/A ratio:  0.91        Systemic VTI:  0.28 m                            Systemic Diam: 1.80 cm Jenkins Rouge MD Electronically signed by Jenkins Rouge MD Signature Date/Time: 08/09/2022/11:49:15 AM    Final    EEG adult  Result Date: 08/08/2022 Lora Havens, MD     08/08/2022  8:57 AM Patient Name: IA LEEB MRN: [id number] Epilepsy Attending: Lora Havens Referring Physician/Provider: Tegeler, Gwenyth Allegra, MD Date: 08/07/2022 Duration: 22.33 mins Patient history: 81yo F with syncope. EEG to evaluate for seizure Level of alertness: Awake AEDs during EEG study: GBP Technical aspects: This EEG study was done with scalp electrodes positioned according to the 10-20 International system of electrode placement. Electrical activity was reviewed with band pass filter of 1-'70Hz'$ , sensitivity of 7 uV/mm, display speed of 19m/sec with a '60Hz'$  notched filter applied as appropriate. EEG data were recorded continuously and digitally stored.  Video monitoring was available and reviewed as appropriate. Description: The posterior dominant rhythm consists of 9 Hz activity of  moderate voltage (25-35 uV) seen predominantly in posterior head regions, symmetric and reactive to eye opening and eye closing. There is intermittent generalized 15 to 18 Hz beta activity. Hyperventilation and photic stimulation were not performed.   IMPRESSION: This study is within normal limits. No seizures or epileptiform discharges were seen throughout the recording. Lora Havens   CT Angio Chest PE W and/or Wo Contrast  Result Date: 08/07/2022 CLINICAL DATA:  Chest pain or SOB, pleurisy or effusion suspected EXAM: CT ANGIOGRAPHY CHEST WITH CONTRAST TECHNIQUE: Multidetector CT imaging of the chest was performed using the standard protocol during bolus administration of intravenous contrast.  Multiplanar CT image reconstructions and MIPs were obtained to evaluate the vascular anatomy. RADIATION DOSE REDUCTION: This exam was performed according to the departmental dose-optimization program which includes automated exposure control, adjustment of the mA and/or kV according to patient size and/or use of iterative reconstruction technique. CONTRAST:  84m OMNIPAQUE IOHEXOL 350 MG/ML SOLN COMPARISON:  Chest x-ray 08/07/2022 FINDINGS: Cardiovascular: Satisfactory opacification of the pulmonary arteries to the segmental level. No evidence of pulmonary embolism. The main pulmonary artery is normal in caliber. Normal heart size. No pericardial effusion. Mild atherosclerotic plaque. At least 2 vessel coronary calcification. Mediastinum/Nodes: No enlarged mediastinal, hilar, or axillary lymph nodes. Thyroid gland, trachea, and esophagus demonstrate no significant findings. Lungs/Pleura: Bilateral lower lobe atelectasis. Biapical pleural/pulmonary scarring. No focal consolidation. No pulmonary nodule. No pulmonary mass. No pleural effusion. No pneumothorax. Upper Abdomen: No acute abnormality. Musculoskeletal: No chest wall abnormality. No suspicious lytic or blastic osseous lesions. No acute displaced fracture. Anterior cervical discectomy and fusion of the C5 through C7 levels. Grade 1 anterolisthesis C7 on T1. Multilevel degenerative changes of the spine. Degenerative changes of bilateral shoulders. Review of the MIP images confirms the above findings. IMPRESSION: 1. No pulmonary embolus. 2. No acute intrathoracic abnormality. Electronically Signed   By: MIven FinnM.D.   On: 08/07/2022 20:19   MR BRAIN WO CONTRAST  Result Date: 08/07/2022 CLINICAL DATA:  Neuro deficit, acute, stroke suspected Intermittent head shaking that is new. Neurology recommend MRI brain without and EEG. EXAM: MRI HEAD WITHOUT CONTRAST TECHNIQUE: Multiplanar, multiecho pulse sequences of the brain and surrounding structures were  obtained without intravenous contrast. COMPARISON:  MRI head December 21 2017. FINDINGS: Brain: No acute infarction, hemorrhage, hydrocephalus, extra-axial collection or mass lesion. Scattered T2/FLAIR hyperintensities the white matter, nonspecific but compatible chronic microvascular ischemic disease. Cerebral atrophy. Vascular: Major arterial flow voids are maintained at the skull base. Skull and upper cervical spine: Normal marrow signal. Sinuses/Orbits: Clear sinuses.  No acute orbital findings. Other: No mastoid effusions IMPRESSION: No evidence of acute intracranial abnormality. Electronically Signed   By: FMargaretha SheffieldM.D.   On: 08/07/2022 17:46   CT HEAD WO CONTRAST (5MM)  Result Date: 08/07/2022 CLINICAL DATA:  Syncope. EXAM: CT HEAD WITHOUT CONTRAST TECHNIQUE: Contiguous axial images were obtained from the base of the skull through the vertex without intravenous contrast. RADIATION DOSE REDUCTION: This exam was performed according to the departmental dose-optimization program which includes automated exposure control, adjustment of the mA and/or kV according to patient size and/or use of iterative reconstruction technique. COMPARISON:  07/24/2021 FINDINGS: Brain: No evidence of acute infarction, hemorrhage, hydrocephalus, extra-axial collection or mass lesion/mass effect. There is mild diffuse low-attenuation within the subcortical and periventricular white matter compatible with chronic microvascular disease. Prominence of the sulci and ventricles compatible with brain atrophy. Vascular: No hyperdense vessel or unexpected calcification. Skull: Normal. Negative for fracture or  focal lesion. Sinuses/Orbits: No acute finding. Other: None. IMPRESSION: 1. No acute intracranial abnormalities. 2. Chronic microvascular disease and brain atrophy. Electronically Signed   By: Kerby Moors M.D.   On: 08/07/2022 12:40   DG Chest 2 View  Result Date: 08/07/2022 CLINICAL DATA:  Syncope EXAM: CHEST - 2 VIEW  COMPARISON:  07/24/2021 FINDINGS: The heart size and mediastinal contours are within normal limits. Implantable loop recorder. Mild diffuse bilateral interstitial pulmonary opacity. Disc degenerative disease of the thoracic spine. IMPRESSION: Mild diffuse bilateral interstitial pulmonary opacity, consistent with edema or atypical/viral infection. No focal airspace opacity. Electronically Signed   By: Delanna Ahmadi M.D.   On: 08/07/2022 12:04    Assessment/Plan:    1. Vasovagal syncope No further issues.  Neg workup otherwise.   2. Vertigo Complete course of meclizine. Symptoms resolved.   3. Vascular dementia without behavioral disturbance (HCC) Current on aricept and namenda Moderate stage.  Ready to discharge back to [place]. Needs reminders and cuing. Would be appropriate for skilled room in the near future.   4. Tremor Improved on Inderal.   5. 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  6. Generalized anxiety disorder Continuing on Clonazepam. Buspar on hold due to ?s/e. Seems to be doing better, will continue to hold.   7. Mixed hyperlipidemia Lab Results  Component Value Date   LDLCALC 81 03/23/2022   On crestor   8. Hx of CVA Neg for acute finding on CT and MRI On statin and plavix.    Patient is being discharged with the following home health services:   Working with therapy  Patient is being discharged with the following durable medical equipment:  has walker   Patient has been advised to f/u with their PCP in 1-2 weeks to for a transitions of care visit.  Social services at their facility was responsible for arranging this appointment.  Pt was provided with adequate prescriptions of noncontrolled medications to reach the scheduled appointment .  For controlled substances, a limited supply was provided as appropriate for the individual patient.  If the pt normally receives these medications from a pain clinic or  has a contract with another physician, these medications should be received from that clinic or physician only).      Discharge review, assessment, plan, and coordination took >30 min

## 2022-08-13 DIAGNOSIS — F01B0 Vascular dementia, moderate, without behavioral disturbance, psychotic disturbance, mood disturbance, and anxiety: Secondary | ICD-10-CM | POA: Diagnosis not present

## 2022-08-13 DIAGNOSIS — Z8673 Personal history of transient ischemic attack (TIA), and cerebral infarction without residual deficits: Secondary | ICD-10-CM | POA: Diagnosis not present

## 2022-08-13 DIAGNOSIS — M5459 Other low back pain: Secondary | ICD-10-CM | POA: Diagnosis not present

## 2022-08-13 DIAGNOSIS — R278 Other lack of coordination: Secondary | ICD-10-CM | POA: Diagnosis not present

## 2022-08-13 DIAGNOSIS — M48062 Spinal stenosis, lumbar region with neurogenic claudication: Secondary | ICD-10-CM | POA: Diagnosis not present

## 2022-08-13 DIAGNOSIS — R2689 Other abnormalities of gait and mobility: Secondary | ICD-10-CM | POA: Diagnosis not present

## 2022-08-13 DIAGNOSIS — R55 Syncope and collapse: Secondary | ICD-10-CM | POA: Diagnosis not present

## 2022-08-15 DIAGNOSIS — M542 Cervicalgia: Secondary | ICD-10-CM | POA: Diagnosis not present

## 2022-08-15 DIAGNOSIS — M545 Low back pain, unspecified: Secondary | ICD-10-CM | POA: Diagnosis not present

## 2022-08-16 ENCOUNTER — Encounter: Payer: Self-pay | Admitting: Internal Medicine

## 2022-08-16 ENCOUNTER — Non-Acute Institutional Stay (SKILLED_NURSING_FACILITY): Payer: Medicare Other | Admitting: Internal Medicine

## 2022-08-16 DIAGNOSIS — M6289 Other specified disorders of muscle: Secondary | ICD-10-CM | POA: Diagnosis not present

## 2022-08-16 DIAGNOSIS — R251 Tremor, unspecified: Secondary | ICD-10-CM | POA: Diagnosis not present

## 2022-08-16 DIAGNOSIS — R42 Dizziness and giddiness: Secondary | ICD-10-CM | POA: Diagnosis not present

## 2022-08-16 DIAGNOSIS — R2689 Other abnormalities of gait and mobility: Secondary | ICD-10-CM | POA: Diagnosis not present

## 2022-08-16 DIAGNOSIS — M546 Pain in thoracic spine: Secondary | ICD-10-CM

## 2022-08-16 DIAGNOSIS — F0150 Vascular dementia without behavioral disturbance: Secondary | ICD-10-CM

## 2022-08-16 DIAGNOSIS — R079 Chest pain, unspecified: Secondary | ICD-10-CM | POA: Diagnosis not present

## 2022-08-16 DIAGNOSIS — W19XXXA Unspecified fall, initial encounter: Secondary | ICD-10-CM | POA: Diagnosis not present

## 2022-08-16 DIAGNOSIS — Z8673 Personal history of transient ischemic attack (TIA), and cerebral infarction without residual deficits: Secondary | ICD-10-CM | POA: Diagnosis not present

## 2022-08-16 DIAGNOSIS — F01B0 Vascular dementia, moderate, without behavioral disturbance, psychotic disturbance, mood disturbance, and anxiety: Secondary | ICD-10-CM | POA: Diagnosis not present

## 2022-08-16 DIAGNOSIS — R55 Syncope and collapse: Secondary | ICD-10-CM | POA: Diagnosis not present

## 2022-08-16 DIAGNOSIS — M5459 Other low back pain: Secondary | ICD-10-CM | POA: Diagnosis not present

## 2022-08-16 DIAGNOSIS — M48062 Spinal stenosis, lumbar region with neurogenic claudication: Secondary | ICD-10-CM | POA: Diagnosis not present

## 2022-08-16 DIAGNOSIS — R278 Other lack of coordination: Secondary | ICD-10-CM | POA: Diagnosis not present

## 2022-08-16 LAB — BASIC METABOLIC PANEL
BUN: 10 (ref 4–21)
BUN: 10 (ref 4–21)
CO2: 23 — AB (ref 13–22)
CO2: 23 — AB (ref 13–22)
Chloride: 104 (ref 99–108)
Chloride: 104 (ref 99–108)
Creatinine: 0.7 (ref 0.5–1.1)
Creatinine: 0.7 (ref 0.5–1.1)
Glucose: 95
Glucose: 95
Potassium: 4.1 mEq/L (ref 3.5–5.1)
Potassium: 4.1 mEq/L (ref 3.5–5.1)
Sodium: 142 (ref 137–147)
Sodium: 142 (ref 137–147)

## 2022-08-16 LAB — HEPATIC FUNCTION PANEL
ALT: 14 U/L (ref 7–35)
ALT: 14 U/L (ref 7–35)
AST: 16 (ref 13–35)
AST: 16 (ref 13–35)
Alkaline Phosphatase: 100 (ref 25–125)
Alkaline Phosphatase: 100 (ref 25–125)
Bilirubin, Total: 0.5
Bilirubin, Total: 0.5
Bilirubin, Total: 0.5

## 2022-08-16 LAB — COMPREHENSIVE METABOLIC PANEL
Albumin: 4 (ref 3.5–5.0)
Albumin: 4 (ref 3.5–5.0)
Calcium: 9.3 (ref 8.7–10.7)
Calcium: 9.3 (ref 8.7–10.7)
Globulin: 2.5
Globulin: 2.5
eGFR: 87
eGFR: 87

## 2022-08-16 NOTE — Progress Notes (Signed)
Location: [facility] of Service:  SNF (31)  Provider:   Code Status: DNR Goals of Care:     08/10/2022   10:38 AM  Advanced Directives  Does Patient Have a Medical Advance Directive? Yes  Type of Advance Directive Out of facility DNR (pink MOST or yellow form);Living will  Does patient want to make changes to medical advance directive? No - Patient declined  Copy of [place] in Chart? No - copy requested     Chief Complaint  Patient presents with   Acute Visit    HPI: Patient is a 81 y.o. female seen today for an acute visit for Fall Back pain Unable to do her ADLS  Patient fell yesterday  Seems like mechanical fall. Denies any dizziness or Syncope But since then she has been unable to do her ADLS C/o Pain in her mid thoracic area Also Nurses think she is having pain with Deep breadths Patient denies any other issue beside Pain in mid Back She stays in AL in [place] with her husband But now admitted in [place] as unable to do her ADLS  Recently was in [place] for Syncopal episode Discharged on 11/16  Also has h/o  Vascular dementia Worsening Cognition S/p CVA in 2018  Essential Tremor, Anxiety, Brachial Plexux Palsy, Chronic Pain in her Left Knee, Hypothyroidism, Hyperlipidemia,  S/P L4-5, L5-S1 decompressive laminectomy with foraminotomies due to Severe Back pain Done on 06/08/21 Past Medical History:  Diagnosis Date   Anxiety    Arthritis    Biceps tendonitis 01/05/2013   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   Cerebral infarction [facility])    CTS (carpal tunnel syndrome) 1995   Degenerative disc disease, cervical    Dementia ([place])    DJD (degenerative joint disease) 2016   DDD with spinal stenosis. Street of back injections by Dr. Brien Few 2016 with good relief, history of cervical DD with possiblity of surgery by Dr. Trenton Gammon.   GERD (gastroesophageal reflux disease)    IBS with moderate refluc seen on upper GI study form  January 2011 Dr. Watt Climes   HTN (hypertension)    no meds   Hyperlipidemia    Hypothyroidism    IBS (irritable bowel syndrome)    Imbalance    Impingement syndrome of right shoulder 01/05/2013   Mass of left side of neck    Memory loss    Mood disorder ([place])    MVP (mitral valve prolapse)    Hx of    Osteopenia    Peripheral edema    Right rotator cuff tear 01/05/2013   Sciatic pain    recurrent   Seasonal allergies    Stroke [facility])    Tension headache    TIA (transient ischemic attack)    Tremor     Past Surgical History:  Procedure Laterality Date   ANTERIOR CERVICAL DECOMP/DISCECTOMY FUSION  04/2015    Dr. Pamala Hurry, [place]  2004   which revealed smooth and normal coronary arteries   CARPAL TUNNEL RELEASE  2000   lt   CATARACT EXTRACTION W/ INTRAOCULAR LENS  IMPLANT, BILATERAL  1/17   Dr. Lucita Ferrara   COSMETIC SURGERY  2010   eyes-facial   DENTAL SURGERY  1960   LASIK     LOOP RECORDER INSERTION N/A 09/22/2017   Procedure: LOOP RECORDER INSERTION;  Surgeon: Constance Haw, MD;  Location: [facility];  Service: Cardiovascular;  Laterality: N/A;   LUMBAR LAMINECTOMY/DECOMPRESSION  MICRODISCECTOMY N/A 06/08/2021   Procedure: Laminectomy and Foraminotomy - L4-L5, L5-S1;  Surgeon: Earnie Larsson, MD;  Location: [facility];  Service: Neurosurgery;  Laterality: N/A;   SHOULDER ARTHROSCOPY WITH ROTATOR CUFF REPAIR Right 01/05/2013   Procedure: SHOULDER ARTHROSCOPY WITH ARTHROSCOPIC  ROTATOR CUFF REPAIR, ACROMIOPLASTY, EXTENSIVE DEBRIDEMENT;  Surgeon: Johnny Bridge, MD;  Location: [facility];  Service: Orthopedics;  Laterality: Right;   TEE WITHOUT CARDIOVERSION N/A 09/22/2017   Procedure: TRANSESOPHAGEAL ECHOCARDIOGRAM (TEE);  Surgeon: Sanda Klein, MD;  Location: [facility];  Service: Cardiovascular;  Laterality: N/A;   TONSILLECTOMY     TUBAL LIGATION      Allergies  Allergen Reactions   Fluticasone Other (See Comments)     DRY EYES   Aciphex [Rabeprazole] Other (See Comments)    Critical    Codeine Nausea Only   Codeine Other (See Comments)    unknown   Cymbalta [Duloxetine Hcl] Other (See Comments)    Critical    Lexapro [Escitalopram] Other (See Comments)    critical   Lipitor [Atorvastatin Calcium] Other (See Comments)    Intolerant    Morphine Sulfate     Per matrix   Pneumovax [Pneumococcal Polysaccharide Vaccine] Other (See Comments)    moderate   Prevacid [Lansoprazole] Other (See Comments)    Unknown, listed on MAR   Sertraline Other (See Comments)    critical   Statins Other (See Comments)    Critical    Sulfa Antibiotics Nausea And Vomiting   Tetracyclines & Related Other (See Comments)    Morphine Sulfate   Welchol [Colesevelam] Other (See Comments)    Unknown, listed on MAR   Pneumovax 23 [Pneumococcal Vac Polyvalent] Rash    Injection site reaction    Outpatient Encounter Medications as of 08/16/2022  Medication Sig   acetaminophen (TYLENOL) 325 MG tablet Take 650 mg by mouth See admin instructions. Take 650 mg 3 times daily, may take 650 mg 2 times daily as needed for pain   Calcium Carb-Cholecalciferol (CALCIUM 500 + D PO) Take 1 tablet by mouth daily.   calcium carbonate (TUMS - DOSED IN MG ELEMENTAL CALCIUM) 500 MG chewable tablet Chew 2 tablets by mouth 2 (two) times daily as needed for indigestion or heartburn.   clonazePAM (KLONOPIN) 0.5 MG tablet Take 0.5 tablets (0.25 mg total) by mouth 2 (two) times daily as needed (for anxiety and shakiness).   clopidogrel (PLAVIX) 75 MG tablet Take 1 tablet (75 mg total) by mouth daily. (Patient taking differently: Take 75 mg by mouth in the morning.)   diclofenac Sodium (VOLTAREN) 1 % GEL Apply 2 g topically 2 (two) times daily as needed (pain).   donepezil (ARICEPT) 10 MG tablet Take 1 tablet (10 mg total) by mouth daily.   gabapentin (NEURONTIN) 100 MG capsule Take 200 mg by mouth 3 (three) times daily.   levothyroxine (SYNTHROID)  88 MCG tablet Take 88 mcg by mouth daily before breakfast.   meclizine (ANTIVERT) 12.5 MG tablet Take 25 mg by mouth 2 (two) times daily.   melatonin 5 MG TABS Take 5 mg by mouth at bedtime as needed (sleep).   memantine (NAMENDA) 5 MG tablet Take 5 mg by mouth 2 (two) times daily.   mirabegron ER (MYRBETRIQ) 25 MG TB24 tablet Take 1 tablet (25 mg total) by mouth daily.   Multiple Vitamins-Minerals (PRESERVISION AREDS PO) Take 1 tablet by mouth 2 (two) times daily.    omeprazole (PRILOSEC) 40 MG capsule Take 40 mg by mouth  daily.   propranolol (INDERAL) 20 MG tablet Take 20 mg by mouth 2 (two) times daily. 1 tablet (20 mg) oral, Twice A Day   rosuvastatin (CRESTOR) 5 MG tablet Take 1 tablet (5 mg total) by mouth daily.   No facility-administered encounter medications on file as of 08/16/2022.    Review of Systems:  Review of Systems  Constitutional:  Positive for activity change. Negative for appetite change.  HENT: Negative.    Respiratory:  Negative for cough and shortness of breath.   Cardiovascular:  Negative for leg swelling.  Gastrointestinal:  Negative for constipation.  Genitourinary: Negative.   Musculoskeletal:  Positive for arthralgias, back pain, gait problem and myalgias.  Skin: Negative.   Neurological:  Positive for weakness. Negative for dizziness.  Psychiatric/Behavioral:  Positive for confusion. Negative for dysphoric mood and sleep disturbance.     Health Maintenance  Topic Date Due   COVID-19 Vaccine (4 - [place]sen risk series) 09/30/2021   Medicare Annual Wellness (AWV)  02/05/2023   Pneumonia Vaccine 57+ Years old  Completed   INFLUENZA VACCINE  Completed   DEXA SCAN  Completed   Zoster Vaccines- Shingrix  Completed   HPV VACCINES  Aged Out    Physical Exam: Vitals:   08/16/22 1619  BP: (!) 155/73  Pulse: 69  Resp: 17  Temp: 98.1 F (36.7 C)  SpO2: 95%   There is no height or weight on file to calculate BMI. Physical Exam Vitals reviewed.   Constitutional:      Appearance: Normal appearance.  HENT:     Head: Normocephalic.     Nose: Nose normal.     Mouth/Throat:     Mouth: Mucous membranes are moist.     Pharynx: Oropharynx is clear.  Eyes:     Pupils: Pupils are equal, round, and reactive to light.  Cardiovascular:     Rate and Rhythm: Normal rate and regular rhythm.     Pulses: Normal pulses.     Heart sounds: Normal heart sounds. No murmur heard. Pulmonary:     Effort: Pulmonary effort is normal.     Breath sounds: Normal breath sounds.  Abdominal:     General: Abdomen is flat. Bowel sounds are normal.     Palpations: Abdomen is soft.  Musculoskeletal:        General: No swelling.     Cervical back: Neck supple.  Skin:    General: Skin is warm.  Neurological:     General: No focal deficit present.     Mental Status: She is alert.  Psychiatric:        Mood and Affect: Mood normal.        Thought Content: Thought content normal.     Labs reviewed: Basic Metabolic Panel: Recent Labs    03/23/22 0000 07/20/22 0000 08/07/22 1122 08/07/22 1615 08/07/22 2343  NA 142 142 141  --  142  K 4.3 4.2 4.2  --  3.5  CL 105 106 104  --   --   CO2 24* 26* 29  --   --   GLUCOSE  --   --  99  --   --   BUN '12 11 14  '$ --   --   CREATININE 0.7 0.7 0.95  --   --   CALCIUM 9.4 9.3 9.2  --   --   MG  --   --   --  2.2  --   PHOS  --   --   --  4.0  --   TSH 1.55  --   --  1.147  --    Liver Function Tests: Recent Labs    07/20/22 0000 08/07/22 1615  AST 20 20  ALT 20 14  ALKPHOS 84 69  BILITOT  --  0.5  PROT  --  6.8  ALBUMIN 3.9 3.6   No results for input(s): "LIPASE", "AMYLASE" in the last 8760 hours. No results for input(s): "AMMONIA" in the last 8760 hours. CBC: Recent Labs    03/22/22 0000 07/20/22 0000 08/07/22 1122 08/07/22 2343  WBC 5.9 5.8 9.9  --   NEUTROABS  --   --  7.5  --   HGB 13.6 14.5 14.1 12.6  HCT 41 42 43.4 37.0  MCV  --   --  98.9  --   PLT 238 224 301  --    Lipid  Panel: Recent Labs    03/23/22 0000  CHOL 174  HDL 66  LDLCALC 81  TRIG 134   Lab Results  Component Value Date   HGBA1C 5.6 09/21/2017    Procedures since last visit: ECHOCARDIOGRAM COMPLETE  Result Date: 08/09/2022    ECHOCARDIOGRAM REPORT   Patient Name:   Bianca Garcia Date of Exam: 08/09/2022 Medical Rec #:  [id number]        Height:       63.0 in Accession #:    [id number]       Weight:       149.9 lb Date of Birth:  November 04, 1940         BSA:          62.711 m Patient Age:    33 years         BP:           144/58 mmHg Patient Gender: F                HR:           57 bpm. Exam Location:  Inpatient Procedure: 2D Echo Indications:    syncope  History:        Patient has prior history of Echocardiogram examinations, most                 recent 09/21/2017. Stroke; Risk Factors:Hypertension and                 Dyslipidemia.  Sonographer:    Harvie Junior Referring Phys: [place]  1. Left ventricular ejection fraction, by estimation, is 60 to 65%. The left ventricle has normal function. The left ventricle has no regional wall motion abnormalities. Left ventricular diastolic parameters were normal.  2. Right ventricular systolic function is normal. The right ventricular size is normal. There is normal pulmonary artery systolic pressure.  3. The mitral valve is normal in structure. No evidence of mitral valve regurgitation. No evidence of mitral stenosis.  4. The aortic valve is tricuspid. There is mild calcification of the aortic valve. There is mild thickening of the aortic valve. Aortic valve regurgitation is trivial. Aortic valve sclerosis is present, with no evidence of aortic valve stenosis.  5. The inferior vena cava is normal in size with greater than 50% respiratory variability, suggesting right atrial pressure of 3 mmHg. FINDINGS  Left Ventricle: Left ventricular ejection fraction, by estimation, is 60 to 65%. The left ventricle has normal function. The left  ventricle has no regional wall motion abnormalities. The left ventricular internal cavity size was normal in size.  There is  no left ventricular hypertrophy. Left ventricular diastolic parameters were normal. Right Ventricle: The right ventricular size is normal. No increase in right ventricular wall thickness. Right ventricular systolic function is normal. There is normal pulmonary artery systolic pressure. The tricuspid regurgitant velocity is 2.75 m/s, and  with an assumed right atrial pressure of 3 mmHg, the estimated right ventricular systolic pressure is 92.4 mmHg. Left Atrium: Left atrial size was normal in size. Right Atrium: Right atrial size was normal in size. Pericardium: Trivial pericardial effusion is present. The pericardial effusion is posterior to the left ventricle. Mitral Valve: The mitral valve is normal in structure. No evidence of mitral valve regurgitation. No evidence of mitral valve stenosis. Tricuspid Valve: The tricuspid valve is normal in structure. Tricuspid valve regurgitation is mild . No evidence of tricuspid stenosis. Aortic Valve: The aortic valve is tricuspid. There is mild calcification of the aortic valve. There is mild thickening of the aortic valve. Aortic valve regurgitation is trivial. Aortic regurgitation PHT measures 719 msec. Aortic valve sclerosis is present, with no evidence of aortic valve stenosis. Aortic valve mean gradient measures 4.0 mmHg. Aortic valve peak gradient measures 6.8 mmHg. Aortic valve area, by VTI measures 2.45 cm. Pulmonic Valve: The pulmonic valve was normal in structure. Pulmonic valve regurgitation is not visualized. No evidence of pulmonic stenosis. Aorta: The aortic root is normal in size and structure. Venous: The inferior vena cava is normal in size with greater than 50% respiratory variability, suggesting right atrial pressure of 3 mmHg. IAS/Shunts: No atrial level shunt detected by color flow Doppler.  LEFT VENTRICLE PLAX 2D LVIDd:          3.70 cm     Diastology LVIDs:         2.50 cm     LV e' medial:    6.09 cm/s LV PW:         0.80 cm     LV E/e' medial:  10.8 LV IVS:        0.80 cm     LV e' lateral:   5.87 cm/s LVOT diam:     1.80 cm     LV E/e' lateral: 11.2 LV SV:         71 LV SV Index:   41 LVOT Area:     2.54 cm  LV Volumes (MOD) LV vol d, MOD A2C: 55.9 ml LV vol d, MOD A4C: 56.8 ml LV vol s, MOD A2C: 25.6 ml LV vol s, MOD A4C: 22.9 ml LV SV MOD A2C:     30.3 ml LV SV MOD A4C:     56.8 ml LV SV MOD BP:      33.0 ml RIGHT VENTRICLE RV Basal diam:  2.90 cm RV Mid diam:    2.90 cm RV S prime:     12.30 cm/s TAPSE (M-mode): 1.4 cm LEFT ATRIUM           Index        RIGHT ATRIUM          Index LA diam:      2.70 cm 1.58 cm/m   RA Area:     8.39 cm LA Vol (A4C): 33.0 ml 19.29 ml/m  RA Volume:   16.30 ml 9.53 ml/m  AORTIC VALVE                    PULMONIC VALVE AV Area (Vmax):    2.60 cm     PV Vmax:  0.85 m/s AV Area (Vmean):   2.19 cm     PV Peak grad:  2.9 mmHg AV Area (VTI):     2.45 cm AV Vmax:           130.00 cm/s AV Vmean:          85.000 cm/s AV VTI:            0.289 m AV Peak Grad:      6.8 mmHg AV Mean Grad:      4.0 mmHg LVOT Vmax:         133.00 cm/s LVOT Vmean:        73.200 cm/s LVOT VTI:          0.278 m LVOT/AV VTI ratio: 0.96 AI PHT:            719 msec  AORTA Ao Root diam: 3.20 cm Ao Asc diam:  2.90 cm MITRAL VALVE               TRICUSPID VALVE MV Area (PHT): 2.83 cm    TR Peak grad:   30.2 mmHg MV Decel Time: 268 msec    TR Vmax:        275.00 cm/s MV E velocity: 66.00 cm/s MV A velocity: 72.30 cm/s  SHUNTS MV E/A ratio:  0.91        Systemic VTI:  0.28 m                            Systemic Diam: 1.80 cm Jenkins Rouge MD Electronically signed by Jenkins Rouge MD Signature Date/Time: 08/09/2022/11:49:15 AM    Final    EEG adult  Result Date: 08/08/2022 Lora Havens, MD     08/08/2022  8:57 AM Patient Name: Bianca Garcia MRN: [id number] Epilepsy Attending: Lora Havens Referring Physician/Provider:  Tegeler, Gwenyth Allegra, MD Date: 08/07/2022 Duration: 22.33 mins Patient history: 81yo F with syncope. EEG to evaluate for seizure Level of alertness: Awake AEDs during EEG study: GBP Technical aspects: This EEG study was done with scalp electrodes positioned according to the 10-20 International system of electrode placement. Electrical activity was reviewed with band pass filter of 1-'70Hz'$ , sensitivity of 7 uV/mm, display speed of 57m/sec with a '60Hz'$  notched filter applied as appropriate. EEG data were recorded continuously and digitally stored.  Video monitoring was available and reviewed as appropriate. Description: The posterior dominant rhythm consists of 9 Hz activity of moderate voltage (25-35 uV) seen predominantly in posterior head regions, symmetric and reactive to eye opening and eye closing. There is intermittent generalized 15 to 18 Hz beta activity. Hyperventilation and photic stimulation were not performed.   IMPRESSION: This study is within normal limits. No seizures or epileptiform discharges were seen throughout the recording. PLora Havens  CT Angio Chest PE W and/or Wo Contrast  Result Date: 08/07/2022 CLINICAL DATA:  Chest pain or SOB, pleurisy or effusion suspected EXAM: CT ANGIOGRAPHY CHEST WITH CONTRAST TECHNIQUE: Multidetector CT imaging of the chest was performed using the standard protocol during bolus administration of intravenous contrast. Multiplanar CT image reconstructions and MIPs were obtained to evaluate the vascular anatomy. RADIATION DOSE REDUCTION: This exam was performed according to the departmental dose-optimization program which includes automated exposure control, adjustment of the mA and/or kV according to patient size and/or use of iterative reconstruction technique. CONTRAST:  777mOMNIPAQUE IOHEXOL 350 MG/ML SOLN COMPARISON:  Chest x-ray 08/07/2022 FINDINGS: Cardiovascular: Satisfactory opacification of the  pulmonary arteries to the segmental level. No evidence  of pulmonary embolism. The main pulmonary artery is normal in caliber. Normal heart size. No pericardial effusion. Mild atherosclerotic plaque. At least 2 vessel coronary calcification. Mediastinum/Nodes: No enlarged mediastinal, hilar, or axillary lymph nodes. Thyroid gland, trachea, and esophagus demonstrate no significant findings. Lungs/Pleura: Bilateral lower lobe atelectasis. Biapical pleural/pulmonary scarring. No focal consolidation. No pulmonary nodule. No pulmonary mass. No pleural effusion. No pneumothorax. Upper Abdomen: No acute abnormality. Musculoskeletal: No chest wall abnormality. No suspicious lytic or blastic osseous lesions. No acute displaced fracture. Anterior cervical discectomy and fusion of the C5 through C7 levels. Grade 1 anterolisthesis C7 on T1. Multilevel degenerative changes of the spine. Degenerative changes of bilateral shoulders. Review of the MIP images confirms the above findings. IMPRESSION: 1. No pulmonary embolus. 2. No acute intrathoracic abnormality. Electronically Signed   By: Iven Finn M.D.   On: 08/07/2022 20:19   MR BRAIN WO CONTRAST  Result Date: 08/07/2022 CLINICAL DATA:  Neuro deficit, acute, stroke suspected Intermittent head shaking that is new. Neurology recommend MRI brain without and EEG. EXAM: MRI HEAD WITHOUT CONTRAST TECHNIQUE: Multiplanar, multiecho pulse sequences of the brain and surrounding structures were obtained without intravenous contrast. COMPARISON:  MRI head December 21 2017. FINDINGS: Brain: No acute infarction, hemorrhage, hydrocephalus, extra-axial collection or mass lesion. Scattered T2/FLAIR hyperintensities the white matter, nonspecific but compatible chronic microvascular ischemic disease. Cerebral atrophy. Vascular: Major arterial flow voids are maintained at the skull base. Skull and upper cervical spine: Normal marrow signal. Sinuses/Orbits: Clear sinuses.  No acute orbital findings. Other: No mastoid effusions IMPRESSION: No  evidence of acute intracranial abnormality. Electronically Signed   By: Margaretha Sheffield M.D.   On: 08/07/2022 17:46   CT HEAD WO CONTRAST (5MM)  Result Date: 08/07/2022 CLINICAL DATA:  Syncope. EXAM: CT HEAD WITHOUT CONTRAST TECHNIQUE: Contiguous axial images were obtained from the base of the skull through the vertex without intravenous contrast. RADIATION DOSE REDUCTION: This exam was performed according to the departmental dose-optimization program which includes automated exposure control, adjustment of the mA and/or kV according to patient size and/or use of iterative reconstruction technique. COMPARISON:  07/24/2021 FINDINGS: Brain: No evidence of acute infarction, hemorrhage, hydrocephalus, extra-axial collection or mass lesion/mass effect. There is mild diffuse low-attenuation within the subcortical and periventricular white matter compatible with chronic microvascular disease. Prominence of the sulci and ventricles compatible with brain atrophy. Vascular: No hyperdense vessel or unexpected calcification. Skull: Normal. Negative for fracture or focal lesion. Sinuses/Orbits: No acute finding. Other: None. IMPRESSION: 1. No acute intracranial abnormalities. 2. Chronic microvascular disease and brain atrophy. Electronically Signed   By: Kerby Moors M.D.   On: 08/07/2022 12:40   DG Chest 2 View  Result Date: 08/07/2022 CLINICAL DATA:  Syncope EXAM: CHEST - 2 VIEW COMPARISON:  07/24/2021 FINDINGS: The heart size and mediastinal contours are within normal limits. Implantable loop recorder. Mild diffuse bilateral interstitial pulmonary opacity. Disc degenerative disease of the thoracic spine. IMPRESSION: Mild diffuse bilateral interstitial pulmonary opacity, consistent with edema or atypical/viral infection. No focal airspace opacity. Electronically Signed   By: Delanna Ahmadi M.D.   On: 08/07/2022 12:04    Assessment/Plan 1. Fall, initial encounter Xray Cervical and Lumbar were negative for any  acute changes Weakness Post Fall Restart Therapy  2. Acute midline thoracic back pain Xray of Thoracic region Also of Chest 2 view Rule out Rib fracture  3. Vertigo Doing well on meclizine  4. Vascular dementia without behavioral disturbance ([place])  On aricept and namenda  5. Tremor Propanolol  CMP today was normal  Labs/tests ordered:  * No order type specified * Next appt:  08/24/2022

## 2022-08-17 ENCOUNTER — Non-Acute Institutional Stay (SKILLED_NURSING_FACILITY): Payer: Medicare Other | Admitting: Orthopedic Surgery

## 2022-08-17 ENCOUNTER — Encounter: Payer: Medicare Other | Admitting: Internal Medicine

## 2022-08-17 ENCOUNTER — Encounter: Payer: Self-pay | Admitting: Orthopedic Surgery

## 2022-08-17 DIAGNOSIS — R42 Dizziness and giddiness: Secondary | ICD-10-CM | POA: Diagnosis not present

## 2022-08-17 DIAGNOSIS — M5459 Other low back pain: Secondary | ICD-10-CM | POA: Diagnosis not present

## 2022-08-17 DIAGNOSIS — F01B0 Vascular dementia, moderate, without behavioral disturbance, psychotic disturbance, mood disturbance, and anxiety: Secondary | ICD-10-CM | POA: Diagnosis not present

## 2022-08-17 DIAGNOSIS — M48062 Spinal stenosis, lumbar region with neurogenic claudication: Secondary | ICD-10-CM | POA: Diagnosis not present

## 2022-08-17 DIAGNOSIS — S2242XA Multiple fractures of ribs, left side, initial encounter for closed fracture: Secondary | ICD-10-CM

## 2022-08-17 DIAGNOSIS — R296 Repeated falls: Secondary | ICD-10-CM

## 2022-08-17 DIAGNOSIS — R55 Syncope and collapse: Secondary | ICD-10-CM | POA: Diagnosis not present

## 2022-08-17 DIAGNOSIS — R278 Other lack of coordination: Secondary | ICD-10-CM | POA: Diagnosis not present

## 2022-08-17 DIAGNOSIS — Z8673 Personal history of transient ischemic attack (TIA), and cerebral infarction without residual deficits: Secondary | ICD-10-CM | POA: Diagnosis not present

## 2022-08-17 DIAGNOSIS — M6289 Other specified disorders of muscle: Secondary | ICD-10-CM | POA: Diagnosis not present

## 2022-08-17 DIAGNOSIS — M546 Pain in thoracic spine: Secondary | ICD-10-CM | POA: Diagnosis not present

## 2022-08-17 DIAGNOSIS — R2689 Other abnormalities of gait and mobility: Secondary | ICD-10-CM | POA: Diagnosis not present

## 2022-08-17 MED ORDER — HYDROCODONE-ACETAMINOPHEN 5-325 MG PO TABS: 0.5000 | ORAL_TABLET | Freq: Four times a day (QID) | ORAL | 0 refills | Status: AC | PRN

## 2022-08-17 NOTE — Progress Notes (Signed)
Location:  [facility] Room Number: 151/A Place of Service:  SNF [phone]) Provider:  Windell Moulding, NP   Patient Care Team: Virgie Dad, MD as PCP - General (Internal Medicine) Nahser, Wonda Cheng, MD as PCP - Cardiology (Cardiology) Clarene Essex, MD as Consulting Physician (Gastroenterology) Marcial Pacas, MD as Consulting Physician (Neurology) Kristeen Miss, MD as Consulting Physician (Neurosurgery) [place], Azaylia Fong, MD as Consulting Physician (Dermatology) Megan Salon, MD as Consulting Physician (Gynecology) [place], Heather, [place] (Optometry) Virgie Dad, MD (Internal Medicine)  Extended Emergency Contact Information Primary Emergency Contact: Velasquez,Robert J Address: [address]          [place], [place] [address] [name] of [place] Phone: [phone] Work Phone: [phone] Mobile Phone: [phone] Relation: Spouse Secondary Emergency Contact: Quitman Livings of [place] Phone: [phone] Mobile Phone: [phone] Relation: Son  Code Status:  DNR Goals of care: Advanced Directive information    08/17/2022   11:01 AM  Advanced Directives  Does Patient Have a Medical Advance Directive? Yes  Type of Paramedic of [place];Living will;Out of facility DNR (pink MOST or yellow form)  Does patient want to make changes to medical advance directive? No - Patient declined  Copy of [place] in Chart? No - copy requested     Chief Complaint  Patient presents with   Acute Visit    Rib Fracture   Quality Metric Gaps    To Discuss the following as listed to the Care Gaps as listed below.     HPI:  Pt is a 81 y.o. female seen today for an acute visit due to recent rib fracture.   She currently resides on the rehab unit at [facility] due to recent hospitalization 11/11- 11/13 for syncope. Syncope thought to be vasovagal. CT head, MRI brain, CT angio, EEG, echo  unremarkable. Telemetry also negative for arrhthymias.   11/19 mechanical fall. 11/20 evaluated by Dr. Lyndel Safe for increased pain to left chest/back/underarm. CXR revealed compression fracture to T8/T9 of indeterminate age, left 4th and 6th ribs fractured, mild left pleural effusion also noted. She was started on daily lidocaine patches and also given one time dose of furosemide. Back/chest pain increased with movement, rated 5/10, described as sharp. She is requesting stronger pain medication for when she does PT. H/o L4-5, L5-S1 laminectomy 05/2021. Remains on meclizine for vertigo. Afebrile. Vitals stable.   11/20 labs: BUN/creat 10.2/0.68, Na+ 142, K+ 4.1, calcium 9.3, glucose 95, AST/ALT 16/14.    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 Street of back injections by Dr. Brien Few 2016 with good relief, history of cervical DD with possiblity of surgery by Dr. Trenton Gammon.   GERD (gastroesophageal reflux disease)    IBS with moderate refluc seen on upper GI study form January 2011 Dr. Watt Climes   HTN (hypertension)    no meds   Hyperlipidemia    Hypothyroidism    IBS (irritable bowel syndrome)    Imbalance    Impingement syndrome of right shoulder 01/05/2013   Mass of left side of neck    Memory loss    Mood disorder ([place])  MVP (mitral valve prolapse)    Hx of    Osteopenia    Peripheral edema    Right rotator cuff tear 01/05/2013   Sciatic pain    recurrent   Seasonal allergies    Stroke [facility])    Tension headache    TIA (transient ischemic attack)    Tremor    Past Surgical History:  Procedure Laterality Date   ANTERIOR CERVICAL DECOMP/DISCECTOMY FUSION  04/2015    Dr. Pamala Hurry, [place]  2004   which revealed smooth and normal coronary arteries   CARPAL TUNNEL RELEASE  2000   lt   CATARACT EXTRACTION W/ INTRAOCULAR LENS  IMPLANT, BILATERAL  1/17   Dr. Lucita Ferrara   COSMETIC SURGERY  2010   eyes-facial   DENTAL SURGERY  1960   LASIK     LOOP RECORDER INSERTION N/A 09/22/2017   Procedure: LOOP RECORDER INSERTION;  Surgeon: Constance Haw, MD;  Location: [facility];  Service: Cardiovascular;  Laterality: N/A;   LUMBAR LAMINECTOMY/DECOMPRESSION MICRODISCECTOMY N/A 06/08/2021   Procedure: Laminectomy and Foraminotomy - L4-L5, L5-S1;  Surgeon: Earnie Larsson, MD;  Location: [facility];  Service: Neurosurgery;  Laterality: N/A;   SHOULDER ARTHROSCOPY WITH ROTATOR CUFF REPAIR Right 01/05/2013   Procedure: SHOULDER ARTHROSCOPY WITH ARTHROSCOPIC  ROTATOR CUFF REPAIR, ACROMIOPLASTY, EXTENSIVE DEBRIDEMENT;  Surgeon: Johnny Bridge, MD;  Location: [facility];  Service: Orthopedics;  Laterality: Right;   TEE WITHOUT CARDIOVERSION N/A 09/22/2017   Procedure: TRANSESOPHAGEAL ECHOCARDIOGRAM (TEE);  Surgeon: Sanda Klein, MD;  Location: [facility];  Service: Cardiovascular;  Laterality: N/A;   TONSILLECTOMY     TUBAL LIGATION      Allergies  Allergen Reactions   Fluticasone Other (See Comments)    DRY EYES   Aciphex [Rabeprazole] Other (See Comments)    Critical    Codeine Nausea Only   Codeine Other (See Comments)    unknown   Cymbalta [Duloxetine Hcl] Other (See Comments)    Critical    Lexapro [Escitalopram] Other (See Comments)    critical   Lipitor [Atorvastatin Calcium] Other (See Comments)    Intolerant    Morphine Sulfate     Per matrix   Pneumovax [Pneumococcal Polysaccharide Vaccine] Other (See Comments)    moderate   Prevacid [Lansoprazole] Other (See Comments)    Unknown, listed on MAR   Sertraline Other (See Comments)    critical   Statins Other (See Comments)    Critical    Sulfa Antibiotics Nausea And Vomiting    Tetracyclines & Related Other (See Comments)    Morphine Sulfate   Welchol [Colesevelam] Other (See Comments)    Unknown, listed on MAR   Pneumovax 23 [Pneumococcal Vac Polyvalent] Rash    Injection site reaction    Outpatient Encounter Medications as of 08/17/2022  Medication Sig   acetaminophen (TYLENOL) 325 MG tablet Take 650 mg by mouth See admin instructions. Take 650 mg 3 times daily, may take 650 mg 2 times daily as needed for pain   busPIRone (BUSPAR) 5 MG tablet Take 10 mg by mouth 2 (two) times daily.   Calcium Carb-Cholecalciferol (CALCIUM 500 + D PO) Take 1 tablet by mouth daily.   calcium carbonate (TUMS - DOSED IN MG ELEMENTAL CALCIUM) 500 MG chewable tablet Chew 2 tablets by mouth 2 (two) times daily as needed for indigestion or heartburn.   clonazePAM (KLONOPIN) 0.5 MG tablet Take 0.5 tablets (0.25 mg total) by mouth 2 (two) times  daily as needed (for anxiety and shakiness).   clopidogrel (PLAVIX) 75 MG tablet Take 1 tablet (75 mg total) by mouth daily.   diclofenac Sodium (VOLTAREN) 1 % GEL Apply 2 g topically 2 (two) times daily as needed (pain).   donepezil (ARICEPT) 10 MG tablet Take 1 tablet (10 mg total) by mouth daily.   gabapentin (NEURONTIN) 100 MG capsule Take 200 mg by mouth 3 (three) times daily.   levothyroxine (SYNTHROID) 88 MCG tablet Take 88 mcg by mouth daily before breakfast.   lidocaine (SALONPAS PAIN RELIEVING) 4 % Place 1 patch onto the skin in the morning. Apply 1 patch to pain site low R side of back or between shoulder blades. Remove patch from back at bedtime   meclizine (ANTIVERT) 12.5 MG tablet Take 25 mg by mouth 2 (two) times daily.   melatonin 5 MG TABS Take 5 mg by mouth at bedtime as needed (sleep).   memantine (NAMENDA) 5 MG tablet Take 5 mg by mouth 2 (two) times daily.   mirabegron ER (MYRBETRIQ) 25 MG TB24 tablet Take 1 tablet (25 mg total) by mouth daily.   Multiple Vitamins-Minerals (PRESERVISION AREDS PO) Take 1 tablet by mouth 2 (two)  times daily.    omeprazole (PRILOSEC) 40 MG capsule Take 40 mg by mouth daily.   propranolol (INDERAL) 20 MG tablet Take 20 mg by mouth 2 (two) times daily. 1 tablet (20 mg) oral, Twice A Day   rosuvastatin (CRESTOR) 5 MG tablet Take 1 tablet (5 mg total) by mouth daily.   No facility-administered encounter medications on file as of 08/17/2022.    Review of Systems  Constitutional:  Positive for activity change. Negative for appetite change, chills, fatigue and fever.  HENT:  Negative for congestion and trouble swallowing.   Eyes:  Negative for visual disturbance.  Respiratory:  Negative for cough, shortness of breath and wheezing.   Cardiovascular:  Negative for chest pain and leg swelling.  Gastrointestinal:  Negative for abdominal distention, abdominal pain, constipation, diarrhea, nausea and vomiting.  Genitourinary:  Negative for dysuria, frequency and hematuria.  Musculoskeletal:  Positive for arthralgias, back pain and gait problem.  Skin:  Negative for wound.  Neurological:  Positive for dizziness and weakness. Negative for headaches.  Psychiatric/Behavioral:  Positive for confusion. Negative for dysphoric mood and sleep disturbance. The patient is not nervous/anxious.     Immunization History  Administered Date(s) Administered   Fluad Quad(high Dose 65+) 07/02/2022   Influenza, High Dose Seasonal PF 07/23/2017, 07/03/2019   Influenza-Unspecified 05/27/2010, 08/03/2010, 07/22/2011, 07/01/2012, 06/22/2013, 07/18/2020, 07/11/2021   Janssen (J&J) SARS-COV-2 Vaccination 11/06/2019   Moderna SARS-COV2 Booster Vaccination 08/05/2021   Moderna Sars-Covid-2 Vaccination 08/07/2020   PFIZER(Purple Top)SARS-COV-2 Vaccination 10/09/2019, 08/07/2020   Pneumococcal Conjugate-13 02/08/2015   Pneumococcal Polysaccharide-23 05/28/2006, 10/13/2011   Pneumococcal-Unspecified 10/17/2013   Td 03/29/2002   Tdap 09/27/2001, 04/25/2002, 08/18/2012   Zoster Recombinat (Shingrix) 05/23/2021,  07/23/2021   Zoster, Live 08/30/2006   Zoster, Unspecified 08/30/2006   Pertinent  Health Maintenance Due  Topic Date Due   INFLUENZA VACCINE  Completed   DEXA SCAN  Completed      08/07/2022   11:17 AM 08/07/2022   11:03 PM 08/08/2022    8:00 PM 08/09/2022    8:00 AM 08/17/2022   10:59 AM  Fall Risk  Falls in the past year?     1  Was there an injury with Fall?     1  Fall Risk Category Calculator  3  Fall Risk Category     High  Patient Fall Risk Level High fall risk High fall risk High fall risk High fall risk High fall risk  Patient at Risk for Falls Due to     History of fall(s);Impaired balance/gait;Impaired mobility  Fall risk Follow up     Falls evaluation completed   Functional Status Survey:    Vitals:   08/17/22 1028  BP: 122/64  Pulse: 62  Resp: 16  Temp: (!) 97.4 F (36.3 C)  SpO2: 92%  Weight: 145 lb 2 oz (65.8 kg)  Height: '5\' 1"'$  (1.549 m)   Body mass index is 27.42 kg/m. Physical Exam Vitals reviewed.  Constitutional:      General: She is not in acute distress. HENT:     Head: Normocephalic.  Eyes:     General:        Right eye: No discharge.        Left eye: No discharge.  Cardiovascular:     Rate and Rhythm: Normal rate and regular rhythm.     Pulses: Normal pulses.     Heart sounds: Normal heart sounds.  Pulmonary:     Effort: Pulmonary effort is normal. No respiratory distress.     Breath sounds: Normal breath sounds. No wheezing.  Abdominal:     General: Bowel sounds are normal. There is no distension.     Palpations: Abdomen is soft.     Tenderness: There is no abdominal tenderness.  Musculoskeletal:     Cervical back: Neck supple.     Right lower leg: No edema.     Left lower leg: No edema.  Skin:    General: Skin is warm and dry.     Capillary Refill: Capillary refill takes less than 2 seconds.  Neurological:     General: No focal deficit present.     Mental Status: She is alert and oriented to person, place, and time.      Motor: Weakness present.     Gait: Gait abnormal.  Psychiatric:        Mood and Affect: Mood normal.        Behavior: Behavior normal.     Labs reviewed: Recent Labs    07/20/22 0000 08/07/22 1122 08/07/22 1615 08/07/22 2343 08/16/22 1300  NA 142 141  --  142 142  K 4.2 4.2  --  3.5 4.1  CL 106 104  --   --  104  CO2 26* 29  --   --  23*  GLUCOSE  --  99  --   --   --   BUN 11 14  --   --  10  CREATININE 0.7 0.95  --   --  0.7  CALCIUM 9.3 9.2  --   --  9.3  MG  --   --  2.2  --   --   PHOS  --   --  4.0  --   --    Recent Labs    07/20/22 0000 08/07/22 1615 08/16/22 1300  AST '20 20 16  '$ ALT '20 14 14  '$ ALKPHOS 84 69 100  BILITOT  --  0.5  --   PROT  --  6.8  --   ALBUMIN 3.9 3.6 4.0   Recent Labs    03/22/22 0000 07/20/22 0000 08/07/22 1122 08/07/22 2343  WBC 5.9 5.8 9.9  --   NEUTROABS  --   --  7.5  --   HGB  13.6 14.5 14.1 12.6  HCT 41 42 43.4 37.0  MCV  --   --  98.9  --   PLT 238 224 301  --    Lab Results  Component Value Date   TSH 1.147 08/07/2022   Lab Results  Component Value Date   HGBA1C 5.6 09/21/2017   Lab Results  Component Value Date   CHOL 174 03/23/2022   HDL 66 03/23/2022   LDLCALC 81 03/23/2022   TRIG 134 03/23/2022   CHOLHDL 2.1 09/21/2017    Significant Diagnostic Results in last 30 days:  ECHOCARDIOGRAM COMPLETE  Result Date: 08/09/2022    ECHOCARDIOGRAM REPORT   Patient Name:   Bianca Garcia Date of Exam: 08/09/2022 Medical Rec #:  [id number]        Height:       63.0 in Accession #:    [id number]       Weight:       149.9 lb Date of Birth:  1941/08/15         BSA:          56.711 m Patient Age:    67 years         BP:           144/58 mmHg Patient Gender: F                HR:           57 bpm. Exam Location:  Inpatient Procedure: 2D Echo Indications:    syncope  History:        Patient has prior history of Echocardiogram examinations, most                 recent 09/21/2017. Stroke; Risk Factors:Hypertension and                  Dyslipidemia.  Sonographer:    Harvie Junior Referring Phys: [place]  1. Left ventricular ejection fraction, by estimation, is 60 to 65%. The left ventricle has normal function. The left ventricle has no regional wall motion abnormalities. Left ventricular diastolic parameters were normal.  2. Right ventricular systolic function is normal. The right ventricular size is normal. There is normal pulmonary artery systolic pressure.  3. The mitral valve is normal in structure. No evidence of mitral valve regurgitation. No evidence of mitral stenosis.  4. The aortic valve is tricuspid. There is mild calcification of the aortic valve. There is mild thickening of the aortic valve. Aortic valve regurgitation is trivial. Aortic valve sclerosis is present, with no evidence of aortic valve stenosis.  5. The inferior vena cava is normal in size with greater than 50% respiratory variability, suggesting right atrial pressure of 3 mmHg. FINDINGS  Left Ventricle: Left ventricular ejection fraction, by estimation, is 60 to 65%. The left ventricle has normal function. The left ventricle has no regional wall motion abnormalities. The left ventricular internal cavity size was normal in size. There is  no left ventricular hypertrophy. Left ventricular diastolic parameters were normal. Right Ventricle: The right ventricular size is normal. No increase in right ventricular wall thickness. Right ventricular systolic function is normal. There is normal pulmonary artery systolic pressure. The tricuspid regurgitant velocity is 2.75 m/s, and  with an assumed right atrial pressure of 3 mmHg, the estimated right ventricular systolic pressure is 96.2 mmHg. Left Atrium: Left atrial size was normal in size. Right Atrium: Right atrial size was normal in size. Pericardium: Trivial pericardial effusion is present.  The pericardial effusion is posterior to the left ventricle. Mitral Valve: The mitral valve is normal in  structure. No evidence of mitral valve regurgitation. No evidence of mitral valve stenosis. Tricuspid Valve: The tricuspid valve is normal in structure. Tricuspid valve regurgitation is mild . No evidence of tricuspid stenosis. Aortic Valve: The aortic valve is tricuspid. There is mild calcification of the aortic valve. There is mild thickening of the aortic valve. Aortic valve regurgitation is trivial. Aortic regurgitation PHT measures 719 msec. Aortic valve sclerosis is present, with no evidence of aortic valve stenosis. Aortic valve mean gradient measures 4.0 mmHg. Aortic valve peak gradient measures 6.8 mmHg. Aortic valve area, by VTI measures 2.45 cm. Pulmonic Valve: The pulmonic valve was normal in structure. Pulmonic valve regurgitation is not visualized. No evidence of pulmonic stenosis. Aorta: The aortic root is normal in size and structure. Venous: The inferior vena cava is normal in size with greater than 50% respiratory variability, suggesting right atrial pressure of 3 mmHg. IAS/Shunts: No atrial level shunt detected by color flow Doppler.  LEFT VENTRICLE PLAX 2D LVIDd:         3.70 cm     Diastology LVIDs:         2.50 cm     LV e' medial:    6.09 cm/s LV PW:         0.80 cm     LV E/e' medial:  10.8 LV IVS:        0.80 cm     LV e' lateral:   5.87 cm/s LVOT diam:     1.80 cm     LV E/e' lateral: 11.2 LV SV:         71 LV SV Index:   41 LVOT Area:     2.54 cm  LV Volumes (MOD) LV vol d, MOD A2C: 55.9 ml LV vol d, MOD A4C: 56.8 ml LV vol s, MOD A2C: 25.6 ml LV vol s, MOD A4C: 22.9 ml LV SV MOD A2C:     30.3 ml LV SV MOD A4C:     56.8 ml LV SV MOD BP:      33.0 ml RIGHT VENTRICLE RV Basal diam:  2.90 cm RV Mid diam:    2.90 cm RV S prime:     12.30 cm/s TAPSE (M-mode): 1.4 cm LEFT ATRIUM           Index        RIGHT ATRIUM          Index LA diam:      2.70 cm 1.58 cm/m   RA Area:     8.39 cm LA Vol (A4C): 33.0 ml 19.29 ml/m  RA Volume:   16.30 ml 9.53 ml/m  AORTIC VALVE                    PULMONIC  VALVE AV Area (Vmax):    2.60 cm     PV Vmax:       0.85 m/s AV Area (Vmean):   2.19 cm     PV Peak grad:  2.9 mmHg AV Area (VTI):     2.45 cm AV Vmax:           130.00 cm/s AV Vmean:          85.000 cm/s AV VTI:            0.289 m AV Peak Grad:      6.8 mmHg AV Mean Grad:  4.0 mmHg LVOT Vmax:         133.00 cm/s LVOT Vmean:        73.200 cm/s LVOT VTI:          0.278 m LVOT/AV VTI ratio: 0.96 AI PHT:            719 msec  AORTA Ao Root diam: 3.20 cm Ao Asc diam:  2.90 cm MITRAL VALVE               TRICUSPID VALVE MV Area (PHT): 2.83 cm    TR Peak grad:   30.2 mmHg MV Decel Time: 268 msec    TR Vmax:        275.00 cm/s MV E velocity: 66.00 cm/s MV A velocity: 72.30 cm/s  SHUNTS MV E/A ratio:  0.91        Systemic VTI:  0.28 m                            Systemic Diam: 1.80 cm Jenkins Rouge MD Electronically signed by Jenkins Rouge MD Signature Date/Time: 08/09/2022/11:49:15 AM    Final    EEG adult  Result Date: 08/08/2022 Lora Havens, MD     08/08/2022  8:57 AM Patient Name: Bianca Garcia MRN: [id number] Epilepsy Attending: Lora Havens Referring Physician/Provider: Tegeler, Gwenyth Allegra, MD Date: 08/07/2022 Duration: 22.33 mins Patient history: 81yo F with syncope. EEG to evaluate for seizure Level of alertness: Awake AEDs during EEG study: GBP Technical aspects: This EEG study was done with scalp electrodes positioned according to the 10-20 International system of electrode placement. Electrical activity was reviewed with band pass filter of 1-'70Hz'$ , sensitivity of 7 uV/mm, display speed of 50m/sec with a '60Hz'$  notched filter applied as appropriate. EEG data were recorded continuously and digitally stored.  Video monitoring was available and reviewed as appropriate. Description: The posterior dominant rhythm consists of 9 Hz activity of moderate voltage (25-35 uV) seen predominantly in posterior head regions, symmetric and reactive to eye opening and eye closing. There is intermittent  generalized 15 to 18 Hz beta activity. Hyperventilation and photic stimulation were not performed.   IMPRESSION: This study is within normal limits. No seizures or epileptiform discharges were seen throughout the recording. PLora Havens  CT Angio Chest PE W and/or Wo Contrast  Result Date: 08/07/2022 CLINICAL DATA:  Chest pain or SOB, pleurisy or effusion suspected EXAM: CT ANGIOGRAPHY CHEST WITH CONTRAST TECHNIQUE: Multidetector CT imaging of the chest was performed using the standard protocol during bolus administration of intravenous contrast. Multiplanar CT image reconstructions and MIPs were obtained to evaluate the vascular anatomy. RADIATION DOSE REDUCTION: This exam was performed according to the departmental dose-optimization program which includes automated exposure control, adjustment of the mA and/or kV according to patient size and/or use of iterative reconstruction technique. CONTRAST:  770mOMNIPAQUE IOHEXOL 350 MG/ML SOLN COMPARISON:  Chest x-ray 08/07/2022 FINDINGS: Cardiovascular: Satisfactory opacification of the pulmonary arteries to the segmental level. No evidence of pulmonary embolism. The main pulmonary artery is normal in caliber. Normal heart size. No pericardial effusion. Mild atherosclerotic plaque. At least 2 vessel coronary calcification. Mediastinum/Nodes: No enlarged mediastinal, hilar, or axillary lymph nodes. Thyroid gland, trachea, and esophagus demonstrate no significant findings. Lungs/Pleura: Bilateral lower lobe atelectasis. Biapical pleural/pulmonary scarring. No focal consolidation. No pulmonary nodule. No pulmonary mass. No pleural effusion. No pneumothorax. Upper Abdomen: No acute abnormality. Musculoskeletal: No chest wall abnormality. No suspicious lytic or  blastic osseous lesions. No acute displaced fracture. Anterior cervical discectomy and fusion of the C5 through C7 levels. Grade 1 anterolisthesis C7 on T1. Multilevel degenerative changes of the spine.  Degenerative changes of bilateral shoulders. Review of the MIP images confirms the above findings. IMPRESSION: 1. No pulmonary embolus. 2. No acute intrathoracic abnormality. Electronically Signed   By: Iven Finn M.D.   On: 08/07/2022 20:19   MR BRAIN WO CONTRAST  Result Date: 08/07/2022 CLINICAL DATA:  Neuro deficit, acute, stroke suspected Intermittent head shaking that is new. Neurology recommend MRI brain without and EEG. EXAM: MRI HEAD WITHOUT CONTRAST TECHNIQUE: Multiplanar, multiecho pulse sequences of the brain and surrounding structures were obtained without intravenous contrast. COMPARISON:  MRI head December 21 2017. FINDINGS: Brain: No acute infarction, hemorrhage, hydrocephalus, extra-axial collection or mass lesion. Scattered T2/FLAIR hyperintensities the white matter, nonspecific but compatible chronic microvascular ischemic disease. Cerebral atrophy. Vascular: Major arterial flow voids are maintained at the skull base. Skull and upper cervical spine: Normal marrow signal. Sinuses/Orbits: Clear sinuses.  No acute orbital findings. Other: No mastoid effusions IMPRESSION: No evidence of acute intracranial abnormality. Electronically Signed   By: Margaretha Sheffield M.D.   On: 08/07/2022 17:46   CT HEAD WO CONTRAST (5MM)  Result Date: 08/07/2022 CLINICAL DATA:  Syncope. EXAM: CT HEAD WITHOUT CONTRAST TECHNIQUE: Contiguous axial images were obtained from the base of the skull through the vertex without intravenous contrast. RADIATION DOSE REDUCTION: This exam was performed according to the departmental dose-optimization program which includes automated exposure control, adjustment of the mA and/or kV according to patient size and/or use of iterative reconstruction technique. COMPARISON:  07/24/2021 FINDINGS: Brain: No evidence of acute infarction, hemorrhage, hydrocephalus, extra-axial collection or mass lesion/mass effect. There is mild diffuse low-attenuation within the subcortical and  periventricular white matter compatible with chronic microvascular disease. Prominence of the sulci and ventricles compatible with brain atrophy. Vascular: No hyperdense vessel or unexpected calcification. Skull: Normal. Negative for fracture or focal lesion. Sinuses/Orbits: No acute finding. Other: None. IMPRESSION: 1. No acute intracranial abnormalities. 2. Chronic microvascular disease and brain atrophy. Electronically Signed   By: Kerby Moors M.D.   On: 08/07/2022 12:40   DG Chest 2 View  Result Date: 08/07/2022 CLINICAL DATA:  Syncope EXAM: CHEST - 2 VIEW COMPARISON:  07/24/2021 FINDINGS: The heart size and mediastinal contours are within normal limits. Implantable loop recorder. Mild diffuse bilateral interstitial pulmonary opacity. Disc degenerative disease of the thoracic spine. IMPRESSION: Mild diffuse bilateral interstitial pulmonary opacity, consistent with edema or atypical/viral infection. No focal airspace opacity. Electronically Signed   By: Delanna Ahmadi M.D.   On: 08/07/2022 12:04    Assessment/Plan 1. Closed fracture of multiple ribs of left side, initial encounter - 11/19 mechanical fall- increased left chest/back/underarm pain - 11/20 CXR revealed compression fracture to T8/T9 of indeterminate age, left 4th and 6th ribs fractured - cont tylenol and lidocaine patches for pain - will start norco- 1/2 tab po Q6hrs prn x 14 days  2. Acute midline thoracic back pain - see above  3. Vertigo - cont meclizine - cont PT/OT  4. Frequent falls - see above    Family/ staff Communication: plan   Labs/tests ordered:  none

## 2022-08-18 DIAGNOSIS — R278 Other lack of coordination: Secondary | ICD-10-CM | POA: Diagnosis not present

## 2022-08-18 DIAGNOSIS — M5459 Other low back pain: Secondary | ICD-10-CM | POA: Diagnosis not present

## 2022-08-18 DIAGNOSIS — R2689 Other abnormalities of gait and mobility: Secondary | ICD-10-CM | POA: Diagnosis not present

## 2022-08-18 DIAGNOSIS — M48062 Spinal stenosis, lumbar region with neurogenic claudication: Secondary | ICD-10-CM | POA: Diagnosis not present

## 2022-08-18 DIAGNOSIS — Z8673 Personal history of transient ischemic attack (TIA), and cerebral infarction without residual deficits: Secondary | ICD-10-CM | POA: Diagnosis not present

## 2022-08-18 DIAGNOSIS — R55 Syncope and collapse: Secondary | ICD-10-CM | POA: Diagnosis not present

## 2022-08-18 DIAGNOSIS — M6289 Other specified disorders of muscle: Secondary | ICD-10-CM | POA: Diagnosis not present

## 2022-08-18 DIAGNOSIS — F01B0 Vascular dementia, moderate, without behavioral disturbance, psychotic disturbance, mood disturbance, and anxiety: Secondary | ICD-10-CM | POA: Diagnosis not present

## 2022-08-18 LAB — CBC AND DIFFERENTIAL
HCT: 41 (ref 36–46)
Hemoglobin: 13.5 (ref 12.0–16.0)
Platelets: 298 10*3/uL (ref 150–400)
WBC: 7.2

## 2022-08-18 LAB — CBC: RBC: 4.23 (ref 3.87–5.11)

## 2022-08-20 DIAGNOSIS — Z8673 Personal history of transient ischemic attack (TIA), and cerebral infarction without residual deficits: Secondary | ICD-10-CM | POA: Diagnosis not present

## 2022-08-20 DIAGNOSIS — F01B0 Vascular dementia, moderate, without behavioral disturbance, psychotic disturbance, mood disturbance, and anxiety: Secondary | ICD-10-CM | POA: Diagnosis not present

## 2022-08-20 DIAGNOSIS — M5459 Other low back pain: Secondary | ICD-10-CM | POA: Diagnosis not present

## 2022-08-20 DIAGNOSIS — R55 Syncope and collapse: Secondary | ICD-10-CM | POA: Diagnosis not present

## 2022-08-20 DIAGNOSIS — M48062 Spinal stenosis, lumbar region with neurogenic claudication: Secondary | ICD-10-CM | POA: Diagnosis not present

## 2022-08-20 DIAGNOSIS — R278 Other lack of coordination: Secondary | ICD-10-CM | POA: Diagnosis not present

## 2022-08-20 DIAGNOSIS — R2689 Other abnormalities of gait and mobility: Secondary | ICD-10-CM | POA: Diagnosis not present

## 2022-08-23 DIAGNOSIS — M48062 Spinal stenosis, lumbar region with neurogenic claudication: Secondary | ICD-10-CM | POA: Diagnosis not present

## 2022-08-23 DIAGNOSIS — M5459 Other low back pain: Secondary | ICD-10-CM | POA: Diagnosis not present

## 2022-08-23 DIAGNOSIS — M6289 Other specified disorders of muscle: Secondary | ICD-10-CM | POA: Diagnosis not present

## 2022-08-23 DIAGNOSIS — Z8673 Personal history of transient ischemic attack (TIA), and cerebral infarction without residual deficits: Secondary | ICD-10-CM | POA: Diagnosis not present

## 2022-08-23 DIAGNOSIS — R278 Other lack of coordination: Secondary | ICD-10-CM | POA: Diagnosis not present

## 2022-08-23 DIAGNOSIS — R55 Syncope and collapse: Secondary | ICD-10-CM | POA: Diagnosis not present

## 2022-08-23 DIAGNOSIS — R2689 Other abnormalities of gait and mobility: Secondary | ICD-10-CM | POA: Diagnosis not present

## 2022-08-23 DIAGNOSIS — F01B0 Vascular dementia, moderate, without behavioral disturbance, psychotic disturbance, mood disturbance, and anxiety: Secondary | ICD-10-CM | POA: Diagnosis not present

## 2022-08-24 ENCOUNTER — Encounter: Payer: Medicare Other | Admitting: Internal Medicine

## 2022-08-24 DIAGNOSIS — F01B0 Vascular dementia, moderate, without behavioral disturbance, psychotic disturbance, mood disturbance, and anxiety: Secondary | ICD-10-CM | POA: Diagnosis not present

## 2022-08-24 DIAGNOSIS — Z8673 Personal history of transient ischemic attack (TIA), and cerebral infarction without residual deficits: Secondary | ICD-10-CM | POA: Diagnosis not present

## 2022-08-24 DIAGNOSIS — M5459 Other low back pain: Secondary | ICD-10-CM | POA: Diagnosis not present

## 2022-08-24 DIAGNOSIS — M6289 Other specified disorders of muscle: Secondary | ICD-10-CM | POA: Diagnosis not present

## 2022-08-24 DIAGNOSIS — R2689 Other abnormalities of gait and mobility: Secondary | ICD-10-CM | POA: Diagnosis not present

## 2022-08-24 DIAGNOSIS — R55 Syncope and collapse: Secondary | ICD-10-CM | POA: Diagnosis not present

## 2022-08-24 DIAGNOSIS — M48062 Spinal stenosis, lumbar region with neurogenic claudication: Secondary | ICD-10-CM | POA: Diagnosis not present

## 2022-08-24 DIAGNOSIS — R278 Other lack of coordination: Secondary | ICD-10-CM | POA: Diagnosis not present

## 2022-08-25 DIAGNOSIS — R278 Other lack of coordination: Secondary | ICD-10-CM | POA: Diagnosis not present

## 2022-08-25 DIAGNOSIS — R2689 Other abnormalities of gait and mobility: Secondary | ICD-10-CM | POA: Diagnosis not present

## 2022-08-25 DIAGNOSIS — R55 Syncope and collapse: Secondary | ICD-10-CM | POA: Diagnosis not present

## 2022-08-25 DIAGNOSIS — F01B0 Vascular dementia, moderate, without behavioral disturbance, psychotic disturbance, mood disturbance, and anxiety: Secondary | ICD-10-CM | POA: Diagnosis not present

## 2022-08-25 DIAGNOSIS — M48062 Spinal stenosis, lumbar region with neurogenic claudication: Secondary | ICD-10-CM | POA: Diagnosis not present

## 2022-08-25 DIAGNOSIS — M5459 Other low back pain: Secondary | ICD-10-CM | POA: Diagnosis not present

## 2022-08-25 DIAGNOSIS — Z8673 Personal history of transient ischemic attack (TIA), and cerebral infarction without residual deficits: Secondary | ICD-10-CM | POA: Diagnosis not present

## 2022-08-26 DIAGNOSIS — F01B0 Vascular dementia, moderate, without behavioral disturbance, psychotic disturbance, mood disturbance, and anxiety: Secondary | ICD-10-CM | POA: Diagnosis not present

## 2022-08-26 DIAGNOSIS — M48062 Spinal stenosis, lumbar region with neurogenic claudication: Secondary | ICD-10-CM | POA: Diagnosis not present

## 2022-08-26 DIAGNOSIS — R55 Syncope and collapse: Secondary | ICD-10-CM | POA: Diagnosis not present

## 2022-08-26 DIAGNOSIS — R278 Other lack of coordination: Secondary | ICD-10-CM | POA: Diagnosis not present

## 2022-08-26 DIAGNOSIS — R2689 Other abnormalities of gait and mobility: Secondary | ICD-10-CM | POA: Diagnosis not present

## 2022-08-26 DIAGNOSIS — M6289 Other specified disorders of muscle: Secondary | ICD-10-CM | POA: Diagnosis not present

## 2022-08-26 DIAGNOSIS — Z8673 Personal history of transient ischemic attack (TIA), and cerebral infarction without residual deficits: Secondary | ICD-10-CM | POA: Diagnosis not present

## 2022-08-26 DIAGNOSIS — M5459 Other low back pain: Secondary | ICD-10-CM | POA: Diagnosis not present

## 2022-08-30 ENCOUNTER — Encounter: Payer: Medicare Other | Admitting: Adult Health

## 2022-08-30 DIAGNOSIS — M48062 Spinal stenosis, lumbar region with neurogenic claudication: Secondary | ICD-10-CM | POA: Diagnosis not present

## 2022-08-30 DIAGNOSIS — M5459 Other low back pain: Secondary | ICD-10-CM | POA: Diagnosis not present

## 2022-08-30 DIAGNOSIS — R278 Other lack of coordination: Secondary | ICD-10-CM | POA: Diagnosis not present

## 2022-08-30 DIAGNOSIS — R2689 Other abnormalities of gait and mobility: Secondary | ICD-10-CM | POA: Diagnosis not present

## 2022-08-30 DIAGNOSIS — R55 Syncope and collapse: Secondary | ICD-10-CM | POA: Diagnosis not present

## 2022-08-30 DIAGNOSIS — M6289 Other specified disorders of muscle: Secondary | ICD-10-CM | POA: Diagnosis not present

## 2022-08-30 DIAGNOSIS — Z8673 Personal history of transient ischemic attack (TIA), and cerebral infarction without residual deficits: Secondary | ICD-10-CM | POA: Diagnosis not present

## 2022-08-30 DIAGNOSIS — F01B0 Vascular dementia, moderate, without behavioral disturbance, psychotic disturbance, mood disturbance, and anxiety: Secondary | ICD-10-CM | POA: Diagnosis not present

## 2022-08-31 DIAGNOSIS — M5459 Other low back pain: Secondary | ICD-10-CM | POA: Diagnosis not present

## 2022-08-31 DIAGNOSIS — M48062 Spinal stenosis, lumbar region with neurogenic claudication: Secondary | ICD-10-CM | POA: Diagnosis not present

## 2022-08-31 DIAGNOSIS — R55 Syncope and collapse: Secondary | ICD-10-CM | POA: Diagnosis not present

## 2022-08-31 DIAGNOSIS — R2689 Other abnormalities of gait and mobility: Secondary | ICD-10-CM | POA: Diagnosis not present

## 2022-08-31 DIAGNOSIS — M6289 Other specified disorders of muscle: Secondary | ICD-10-CM | POA: Diagnosis not present

## 2022-08-31 DIAGNOSIS — R278 Other lack of coordination: Secondary | ICD-10-CM | POA: Diagnosis not present

## 2022-08-31 DIAGNOSIS — F01B0 Vascular dementia, moderate, without behavioral disturbance, psychotic disturbance, mood disturbance, and anxiety: Secondary | ICD-10-CM | POA: Diagnosis not present

## 2022-08-31 DIAGNOSIS — Z8673 Personal history of transient ischemic attack (TIA), and cerebral infarction without residual deficits: Secondary | ICD-10-CM | POA: Diagnosis not present

## 2022-08-31 NOTE — Progress Notes (Signed)
This encounter was created in error - please disregard.

## 2022-09-01 DIAGNOSIS — M5459 Other low back pain: Secondary | ICD-10-CM | POA: Diagnosis not present

## 2022-09-01 DIAGNOSIS — Z8673 Personal history of transient ischemic attack (TIA), and cerebral infarction without residual deficits: Secondary | ICD-10-CM | POA: Diagnosis not present

## 2022-09-01 DIAGNOSIS — M6289 Other specified disorders of muscle: Secondary | ICD-10-CM | POA: Diagnosis not present

## 2022-09-01 DIAGNOSIS — M48062 Spinal stenosis, lumbar region with neurogenic claudication: Secondary | ICD-10-CM | POA: Diagnosis not present

## 2022-09-01 DIAGNOSIS — R278 Other lack of coordination: Secondary | ICD-10-CM | POA: Diagnosis not present

## 2022-09-01 DIAGNOSIS — R55 Syncope and collapse: Secondary | ICD-10-CM | POA: Diagnosis not present

## 2022-09-01 DIAGNOSIS — F01B0 Vascular dementia, moderate, without behavioral disturbance, psychotic disturbance, mood disturbance, and anxiety: Secondary | ICD-10-CM | POA: Diagnosis not present

## 2022-09-01 DIAGNOSIS — R2689 Other abnormalities of gait and mobility: Secondary | ICD-10-CM | POA: Diagnosis not present

## 2022-09-02 ENCOUNTER — Non-Acute Institutional Stay (SKILLED_NURSING_FACILITY): Payer: Medicare Other | Admitting: Adult Health

## 2022-09-02 ENCOUNTER — Encounter: Payer: Self-pay | Admitting: Adult Health

## 2022-09-02 DIAGNOSIS — F01B0 Vascular dementia, moderate, without behavioral disturbance, psychotic disturbance, mood disturbance, and anxiety: Secondary | ICD-10-CM | POA: Diagnosis not present

## 2022-09-02 DIAGNOSIS — R278 Other lack of coordination: Secondary | ICD-10-CM | POA: Diagnosis not present

## 2022-09-02 DIAGNOSIS — S2242XA Multiple fractures of ribs, left side, initial encounter for closed fracture: Secondary | ICD-10-CM

## 2022-09-02 DIAGNOSIS — M6289 Other specified disorders of muscle: Secondary | ICD-10-CM | POA: Diagnosis not present

## 2022-09-02 DIAGNOSIS — R051 Acute cough: Secondary | ICD-10-CM | POA: Diagnosis not present

## 2022-09-02 DIAGNOSIS — M48062 Spinal stenosis, lumbar region with neurogenic claudication: Secondary | ICD-10-CM | POA: Diagnosis not present

## 2022-09-02 DIAGNOSIS — R059 Cough, unspecified: Secondary | ICD-10-CM | POA: Diagnosis not present

## 2022-09-02 DIAGNOSIS — Z8673 Personal history of transient ischemic attack (TIA), and cerebral infarction without residual deficits: Secondary | ICD-10-CM | POA: Diagnosis not present

## 2022-09-02 DIAGNOSIS — M5459 Other low back pain: Secondary | ICD-10-CM | POA: Diagnosis not present

## 2022-09-02 DIAGNOSIS — R2689 Other abnormalities of gait and mobility: Secondary | ICD-10-CM | POA: Diagnosis not present

## 2022-09-02 DIAGNOSIS — R55 Syncope and collapse: Secondary | ICD-10-CM | POA: Diagnosis not present

## 2022-09-02 NOTE — Progress Notes (Signed)
Location:  [facility] Room Number: 153-A Place of Service:  SNF [phone]) Provider:  Earney Hamburg, MD  Patient Care Team: Virgie Dad, MD as PCP - General (Internal Medicine) Nahser, Wonda Cheng, MD as PCP - Cardiology (Cardiology) Clarene Essex, MD as Consulting Physician (Gastroenterology) Marcial Pacas, MD as Consulting Physician (Neurology) Kristeen Miss, MD as Consulting Physician (Neurosurgery) [place], Amy, MD as Consulting Physician (Dermatology) Megan Salon, MD as Consulting Physician (Gynecology) [place], Heather, Miles (Optometry) Virgie Dad, MD (Internal Medicine)  Extended Emergency Contact Information Primary Emergency Contact: Kohler,Robert J Address: [address]          [place],  [address] [name] of [place] Phone: [phone] Work Phone: [phone] Mobile Phone: [phone] Relation: Spouse Secondary Emergency Contact: Q[place]man Livings of [place] Phone: [phone] Mobile Phone: [phone] Relation: Son  Code Status:  DNR Goals of care: Advanced Directive information    09/02/2022    3:23 PM  Advanced Directives  Does Patient Have a Medical Advance Directive? Yes  Type of Paramedic of [place];Living will;Out of facility DNR (pink MOST or yellow form)  Does patient want to make changes to medical advance directive? No - Patient declined  Copy of [place] in Chart? Yes - validated most recent copy scanned in chart (See row information)  Pre-existing out of facility DNR order (yellow form or pink MOST form) Yellow form placed in chart (order not valid for inpatient use)     Chief Complaint  Patient presents with   Acute Visit    Cough     HPI:  Pt is a 81 y.o. female seen today for an acute visit for cough.   Bianca Garcia resides in the rehab area due to a fall on 11/19 with back pain and rib pain. CXR revealed  compression fracture to T8/T9 of indeterminate age, left 4th and 6th ribs fractured, mild left pleural effusion also noted. She is has been working with therapy and had some gains, currently walking with a walker. Due to incontinence and memory loss it is recommended that she considered permanent skilled care.   The nurse asked me to see her today due to lethargy, cough, runny nose, and nasal congestion. Sats 92%. Flu and covid swab rapid were negative. No fever or purulent sputum. Vital stable. Eating and drinking.   12/7 CXR ordered which is negative for an acute process.   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 Street of back injections by Dr. Brien Few 2016 with good relief, history of cervical DD with possiblity of surgery by Dr. Trenton Gammon.   GERD (gastroesophageal reflux disease)    IBS with moderate refluc seen on upper GI study form January 2011 Dr. Watt Climes   HTN (hypertension)    no meds   Hyperlipidemia    Hypothyroidism    IBS (irritable bowel syndrome)    Imbalance    Impingement syndrome of right shoulder 01/05/2013   Mass of left side of neck    Memory loss    Mood disorder (HCC)    MVP (mitral valve prolapse)  Hx of    Osteopenia    Peripheral edema    Right rotator cuff tear 01/05/2013   Sciatic pain    recurrent   Seasonal allergies    Stroke [facility])    Tension headache    TIA (transient ischemic attack)    Tremor    Past Surgical History:  Procedure Laterality Date   ANTERIOR CERVICAL DECOMP/DISCECTOMY FUSION  04/2015    Dr. Pamala Hurry, [place]  2004   which revealed smooth and normal coronary arteries   CARPAL TUNNEL RELEASE  2000   lt   CATARACT EXTRACTION W/  INTRAOCULAR LENS  IMPLANT, BILATERAL  1/17   Dr. Lucita Ferrara   COSMETIC SURGERY  2010   eyes-facial   DENTAL SURGERY  1960   LASIK     LOOP RECORDER INSERTION N/A 09/22/2017   Procedure: LOOP RECORDER INSERTION;  Surgeon: Constance Haw, MD;  Location: [facility];  Service: Cardiovascular;  Laterality: N/A;   LUMBAR LAMINECTOMY/DECOMPRESSION MICRODISCECTOMY N/A 06/08/2021   Procedure: Laminectomy and Foraminotomy - L4-L5, L5-S1;  Surgeon: Earnie Larsson, MD;  Location: [facility];  Service: Neurosurgery;  Laterality: N/A;   SHOULDER ARTHROSCOPY WITH ROTATOR CUFF REPAIR Right 01/05/2013   Procedure: SHOULDER ARTHROSCOPY WITH ARTHROSCOPIC  ROTATOR CUFF REPAIR, ACROMIOPLASTY, EXTENSIVE DEBRIDEMENT;  Surgeon: Johnny Bridge, MD;  Location: [facility];  Service: Orthopedics;  Laterality: Right;   TEE WITHOUT CARDIOVERSION N/A 09/22/2017   Procedure: TRANSESOPHAGEAL ECHOCARDIOGRAM (TEE);  Surgeon: Sanda Klein, MD;  [facility];  Service: Cardiovascular;  Laterality: N/A;   TONSILLECTOMY     TUBAL LIGATION      Allergies  Allergen Reactions   Fluticasone Other (See Comments)    DRY EYES   Aciphex [Rabeprazole] Other (See Comments)    Critical    Codeine Nausea Only   Codeine Other (See Comments)    unknown   Cymbalta [Duloxetine Hcl] Other (See Comments)    Critical    Lexapro [Escitalopram] Other (See Comments)    critical   Lipitor [Atorvastatin Calcium] Other (See Comments)    Intolerant    Morphine Sulfate     Per matrix   Pneumovax [Pneumococcal Polysaccharide Vaccine] Other (See Comments)    moderate   Prevacid [Lansoprazole] Other (See Comments)    Unknown, listed on MAR   Sertraline Other (See Comments)    critical   Statins Other (See Comments)    Critical    Sulfa Antibiotics Nausea And Vomiting   Tetracyclines & Related Other (See Comments)    Morphine Sulfate   Welchol [Colesevelam] Other (See Comments)    Unknown, listed on MAR    Pneumovax 23 [Pneumococcal Vac Polyvalent] Rash    Injection site reaction    Outpatient Encounter Medications as of 09/02/2022  Medication Sig   acetaminophen (TYLENOL) 325 MG tablet Take 650 mg by mouth See admin instructions. Take 650 mg 3 times daily, may take 650 mg 2 times daily as needed for pain   Calcium Carb-Cholecalciferol (CALCIUM 500 + D PO) Take 1 tablet by mouth daily.   calcium carbonate (TUMS - DOSED IN MG ELEMENTAL CALCIUM) 500 MG chewable tablet Chew 2 tablets by mouth 2 (two) times daily as needed for indigestion or heartburn.   clonazePAM (KLONOPIN) 0.5 MG tablet Take 0.5 tablets (0.25 mg total) by mouth 2 (two) times daily as needed (for anxiety and shakiness).   clopidogrel (PLAVIX) 75 MG tablet Take 1 tablet (75 mg total) by mouth daily.  diclofenac Sodium (VOLTAREN) 1 % GEL Apply 2 g topically 2 (two) times daily as needed (pain).   donepezil (ARICEPT) 10 MG tablet Take 1 tablet (10 mg total) by mouth daily.   gabapentin (NEURONTIN) 100 MG capsule Take 200 mg by mouth 3 (three) times daily.   HYDROcodone-acetaminophen (NORCO/VICODIN) 5-325 MG tablet Take 0.5 tablets by mouth every 6 (six) hours as needed.   levothyroxine (SYNTHROID) 88 MCG tablet Take 88 mcg by mouth daily before breakfast.   lidocaine (SALONPAS PAIN RELIEVING) 4 % Place 1 patch onto the skin in the morning. Apply 1 patch to pain site low R side of back or between shoulder blades. Remove patch from back at bedtime   melatonin 5 MG TABS Take 5 mg by mouth at bedtime as needed (sleep).   memantine (NAMENDA) 5 MG tablet Take 5 mg by mouth 2 (two) times daily.   mirabegron ER (MYRBETRIQ) 25 MG TB24 tablet Take 1 tablet (25 mg total) by mouth daily.   Multiple Vitamins-Minerals (PRESERVISION AREDS PO) Take 1 tablet by mouth 2 (two) times daily.    omeprazole (PRILOSEC) 40 MG capsule Take 40 mg by mouth daily.   propranolol (INDERAL) 20 MG tablet Take 20 mg by mouth 2 (two) times daily. 1 tablet (20 mg)  oral, Twice A Day   rosuvastatin (CRESTOR) 5 MG tablet Take 1 tablet (5 mg total) by mouth daily.   [DISCONTINUED] busPIRone (BUSPAR) 5 MG tablet Take 10 mg by mouth 2 (two) times daily.   [DISCONTINUED] meclizine (ANTIVERT) 12.5 MG tablet Take 25 mg by mouth 2 (two) times daily.   No facility-administered encounter medications on file as of 09/02/2022.    Review of Systems  Constitutional:  Positive for activity change and fatigue. Negative for appetite change, chills, diaphoresis, fever and unexpected weight change.  HENT:  Positive for congestion, postnasal drip and rhinorrhea. Negative for ear discharge, ear pain, sinus pressure, sinus pain, sore throat and trouble swallowing.   Respiratory:  Positive for cough. Negative for shortness of breath and wheezing.   Cardiovascular:  Negative for chest pain, palpitations and leg swelling.  Gastrointestinal:  Negative for abdominal distention, abdominal pain, constipation and diarrhea.  Genitourinary:  Negative for difficulty urinating and dysuria.  Musculoskeletal:  Positive for back pain and gait problem. Negative for arthralgias, joint swelling and myalgias.  Neurological:  Positive for tremors. Negative for dizziness, seizures, syncope, facial asymmetry, speech difficulty, weakness, light-headedness, numbness and headaches.  Psychiatric/Behavioral:  Positive for confusion. Negative for agitation and behavioral problems.     Immunization History  Administered Date(s) Administered   Fluad Quad(high Dose 65+) 07/02/2022   Influenza, High Dose Seasonal PF 07/23/2017, 07/03/2019   Influenza-Unspecified 05/27/2010, 08/03/2010, 07/22/2011, 07/01/2012, 06/22/2013, 07/18/2020, 07/11/2021   Janssen (J&J) SARS-COV-2 Vaccination 11/06/2019   Moderna SARS-COV2 Booster Vaccination 08/05/2021   Moderna Sars-Covid-2 Vaccination 08/07/2020   PFIZER(Purple Top)SARS-COV-2 Vaccination 10/09/2019, 08/07/2020   Pneumococcal Conjugate-13 02/08/2015    Pneumococcal Polysaccharide-23 05/28/2006, 10/13/2011   Pneumococcal-Unspecified 10/17/2013   Td 03/29/2002   Tdap 09/27/2001, 04/25/2002, 08/18/2012   Zoster Recombinat (Shingrix) 05/23/2021, 07/23/2021   Zoster, Live 08/30/2006   Zoster, Unspecified 08/30/2006   Pertinent  Health Maintenance Due  Topic Date Due   INFLUENZA VACCINE  Completed   DEXA SCAN  Completed      08/07/2022   11:17 AM 08/07/2022   11:03 PM 08/08/2022    8:00 PM 08/09/2022    8:00 AM 08/17/2022   10:59 AM  Fall Risk  Falls in  the past year?     1  Was there an injury with Fall?     1  Fall Risk Category Calculator     3  Fall Risk Category     High  Patient Fall Risk Level High fall risk High fall risk High fall risk High fall risk High fall risk  Patient at Risk for Falls Due to     History of fall(s);Impaired balance/gait;Impaired mobility  Fall risk Follow up     Falls evaluation completed   Functional Status Survey:    Vitals:   09/02/22 1521  BP: 114/70  Pulse: 83  Resp: 18  Temp: 97.8 F (36.6 C)  SpO2: 92%  Weight: 147 lb 3.2 oz (66.8 kg)  Height: '5\' 1"'$  (1.549 m)   Body mass index is 27.81 kg/m. Physical Exam Vitals and nursing note reviewed.  Constitutional:      General: She is not in acute distress.    Appearance: She is ill-appearing. She is not diaphoretic.     Comments: Sleepy but easily arouses and f/c  HENT:     Head: Normocephalic and atraumatic.     Right Ear: There is impacted cerumen.     Left Ear: There is impacted cerumen.     Nose: Congestion and rhinorrhea present.     Mouth/Throat:     Mouth: Mucous membranes are moist.     Pharynx: Oropharynx is clear. No oropharyngeal exudate.  Eyes:     Conjunctiva/sclera: Conjunctivae normal.     Pupils: Pupils are equal, round, and reactive to light.  Neck:     Vascular: No JVD.  Cardiovascular:     Rate and Rhythm: Normal rate and regular rhythm.     Heart sounds: No murmur heard. Pulmonary:     Effort: Pulmonary  effort is normal. No respiratory distress.     Breath sounds: No wheezing.     Comments: Decreased bases  Skin:    General: Skin is warm and dry.  Neurological:     Mental Status: She is oriented to person, place, and time.     Labs reviewed: Recent Labs    08/07/22 1122 08/07/22 1615 08/07/22 2343 08/16/22 0000 08/16/22 1300  NA 141  --  142 142 142  K 4.2  --  3.5 4.1 4.1  CL 104  --   --  104 104  CO2 29  --   --  23* 23*  GLUCOSE 99  --   --   --   --   BUN 14  --   --  10 10  CREATININE 0.95  --   --  0.7 0.7  CALCIUM 9.2  --   --  9.3 9.3  MG  --  2.2  --   --   --   PHOS  --  4.0  --   --   --    Recent Labs    08/07/22 1615 08/16/22 0000 08/16/22 1300  AST '20 16 16  '$ ALT '14 14 14  '$ ALKPHOS 69 100 100  BILITOT 0.5  --   --   PROT 6.8  --   --   ALBUMIN 3.6 4.0 4.0   Recent Labs    03/22/22 0000 07/20/22 0000 08/07/22 1122 08/07/22 2343  WBC 5.9 5.8 9.9  --   NEUTROABS  --   --  7.5  --   HGB 13.6 14.5 14.1 12.6  HCT 41 42 43.4 37.0  MCV  --   --  98.9  --   PLT 238 224 301  --    Lab Results  Component Value Date   TSH 1.147 08/07/2022   Lab Results  Component Value Date   HGBA1C 5.6 09/21/2017   Lab Results  Component Value Date   CHOL 174 03/23/2022   HDL 66 03/23/2022   LDLCALC 81 03/23/2022   TRIG 134 03/23/2022   CHOLHDL 2.1 09/21/2017    Significant Diagnostic Results in last 30 days:  ECHOCARDIOGRAM COMPLETE  Result Date: 08/09/2022    ECHOCARDIOGRAM REPORT   Patient Name:   Bianca Garcia Date of Exam: 08/09/2022 Medical Rec #:  [id number]        Height:       63.0 in Accession #:    [id number]       Weight:       149.9 lb Date of Birth:  05-14-1941         BSA:          67.711 m Patient Age:    81 years         BP:           144/58 mmHg Patient Gender: F                HR:           57 bpm. Exam Location:  Inpatient Procedure: 2D Echo Indications:    syncope  History:        Patient has prior history of Echocardiogram  examinations, most                 recent 09/21/2017. Stroke; Risk Factors:Hypertension and                 Dyslipidemia.  Sonographer:    Harvie Junior Referring Phys: [place]  1. Left ventricular ejection fraction, by estimation, is 60 to 65%. The left ventricle has normal function. The left ventricle has no regional wall motion abnormalities. Left ventricular diastolic parameters were normal.  2. Right ventricular systolic function is normal. The right ventricular size is normal. There is normal pulmonary artery systolic pressure.  3. The mitral valve is normal in structure. No evidence of mitral valve regurgitation. No evidence of mitral stenosis.  4. The aortic valve is tricuspid. There is mild calcification of the aortic valve. There is mild thickening of the aortic valve. Aortic valve regurgitation is trivial. Aortic valve sclerosis is present, with no evidence of aortic valve stenosis.  5. The inferior vena cava is normal in size with greater than 50% respiratory variability, suggesting right atrial pressure of 3 mmHg. FINDINGS  Left Ventricle: Left ventricular ejection fraction, by estimation, is 60 to 65%. The left ventricle has normal function. The left ventricle has no regional wall motion abnormalities. The left ventricular internal cavity size was normal in size. There is  no left ventricular hypertrophy. Left ventricular diastolic parameters were normal. Right Ventricle: The right ventricular size is normal. No increase in right ventricular wall thickness. Right ventricular systolic function is normal. There is normal pulmonary artery systolic pressure. The tricuspid regurgitant velocity is 2.75 m/s, and  with an assumed right atrial pressure of 3 mmHg, the estimated right ventricular systolic pressure is 20.2 mmHg. Left Atrium: Left atrial size was normal in size. Right Atrium: Right atrial size was normal in size. Pericardium: Trivial pericardial effusion is present. The  pericardial effusion is posterior to the left ventricle. Mitral Valve: The mitral valve is normal in  structure. No evidence of mitral valve regurgitation. No evidence of mitral valve stenosis. Tricuspid Valve: The tricuspid valve is normal in structure. Tricuspid valve regurgitation is mild . No evidence of tricuspid stenosis. Aortic Valve: The aortic valve is tricuspid. There is mild calcification of the aortic valve. There is mild thickening of the aortic valve. Aortic valve regurgitation is trivial. Aortic regurgitation PHT measures 719 msec. Aortic valve sclerosis is present, with no evidence of aortic valve stenosis. Aortic valve mean gradient measures 4.0 mmHg. Aortic valve peak gradient measures 6.8 mmHg. Aortic valve area, by VTI measures 2.45 cm. Pulmonic Valve: The pulmonic valve was normal in structure. Pulmonic valve regurgitation is not visualized. No evidence of pulmonic stenosis. Aorta: The aortic root is normal in size and structure. Venous: The inferior vena cava is normal in size with greater than 50% respiratory variability, suggesting right atrial pressure of 3 mmHg. IAS/Shunts: No atrial level shunt detected by color flow Doppler.  LEFT VENTRICLE PLAX 2D LVIDd:         3.70 cm     Diastology LVIDs:         2.50 cm     LV e' medial:    6.09 cm/s LV PW:         0.80 cm     LV E/e' medial:  10.8 LV IVS:        0.80 cm     LV e' lateral:   5.87 cm/s LVOT diam:     1.80 cm     LV E/e' lateral: 11.2 LV SV:         71 LV SV Index:   41 LVOT Area:     2.54 cm  LV Volumes (MOD) LV vol d, MOD A2C: 55.9 ml LV vol d, MOD A4C: 56.8 ml LV vol s, MOD A2C: 25.6 ml LV vol s, MOD A4C: 22.9 ml LV SV MOD A2C:     30.3 ml LV SV MOD A4C:     56.8 ml LV SV MOD BP:      33.0 ml RIGHT VENTRICLE RV Basal diam:  2.90 cm RV Mid diam:    2.90 cm RV S prime:     12.30 cm/s TAPSE (M-mode): 1.4 cm LEFT ATRIUM           Index        RIGHT ATRIUM          Index LA diam:      2.70 cm 1.58 cm/m   RA Area:     8.39 cm LA Vol  (A4C): 33.0 ml 19.29 ml/m  RA Volume:   16.30 ml 9.53 ml/m  AORTIC VALVE                    PULMONIC VALVE AV Area (Vmax):    2.60 cm     PV Vmax:       0.85 m/s AV Area (Vmean):   2.19 cm     PV Peak grad:  2.9 mmHg AV Area (VTI):     2.45 cm AV Vmax:           130.00 cm/s AV Vmean:          85.000 cm/s AV VTI:            0.289 m AV Peak Grad:      6.8 mmHg AV Mean Grad:      4.0 mmHg LVOT Vmax:         133.00 cm/s LVOT  Vmean:        73.200 cm/s LVOT VTI:          0.278 m LVOT/AV VTI ratio: 0.96 AI PHT:            719 msec  AORTA Ao Root diam: 3.20 cm Ao Asc diam:  2.90 cm MITRAL VALVE               TRICUSPID VALVE MV Area (PHT): 2.83 cm    TR Peak grad:   30.2 mmHg MV Decel Time: 268 msec    TR Vmax:        275.00 cm/s MV E velocity: 66.00 cm/s MV A velocity: 72.30 cm/s  SHUNTS MV E/A ratio:  0.91        Systemic VTI:  0.28 m                            Systemic Diam: 1.80 cm Jenkins Rouge MD Electronically signed by Jenkins Rouge MD Signature Date/Time: 08/09/2022/11:49:15 AM    Final    EEG adult  Result Date: 08/08/2022 Lora Havens, MD     08/08/2022  8:57 AM Patient Name: RAYSSA ATHA MRN: [id number] Epilepsy Attending: Lora Havens Referring Physician/Provider: Tegeler, Gwenyth Allegra, MD Date: 08/07/2022 Duration: 22.33 mins Patient history: 81yo F with syncope. EEG to evaluate for seizure Level of alertness: Awake AEDs during EEG study: GBP Technical aspects: This EEG study was done with scalp electrodes positioned according to the 10-20 International system of electrode placement. Electrical activity was reviewed with band pass filter of 1-'70Hz'$ , sensitivity of 7 uV/mm, display speed of 33m/sec with a '60Hz'$  notched filter applied as appropriate. EEG data were recorded continuously and digitally stored.  Video monitoring was available and reviewed as appropriate. Description: The posterior dominant rhythm consists of 9 Hz activity of moderate voltage (25-35 uV) seen predominantly in  posterior head regions, symmetric and reactive to eye opening and eye closing. There is intermittent generalized 15 to 18 Hz beta activity. Hyperventilation and photic stimulation were not performed.   IMPRESSION: This study is within normal limits. No seizures or epileptiform discharges were seen throughout the recording. PLora Havens  CT Angio Chest PE W and/or Wo Contrast  Result Date: 08/07/2022 CLINICAL DATA:  Chest pain or SOB, pleurisy or effusion suspected EXAM: CT ANGIOGRAPHY CHEST WITH CONTRAST TECHNIQUE: Multidetector CT imaging of the chest was performed using the standard protocol during bolus administration of intravenous contrast. Multiplanar CT image reconstructions and MIPs were obtained to evaluate the vascular anatomy. RADIATION DOSE REDUCTION: This exam was performed according to the departmental dose-optimization program which includes automated exposure control, adjustment of the mA and/or kV according to patient size and/or use of iterative reconstruction technique. CONTRAST:  775mOMNIPAQUE IOHEXOL 350 MG/ML SOLN COMPARISON:  Chest x-ray 08/07/2022 FINDINGS: Cardiovascular: Satisfactory opacification of the pulmonary arteries to the segmental level. No evidence of pulmonary embolism. The main pulmonary artery is normal in caliber. Normal heart size. No pericardial effusion. Mild atherosclerotic plaque. At least 2 vessel coronary calcification. Mediastinum/Nodes: No enlarged mediastinal, hilar, or axillary lymph nodes. Thyroid gland, trachea, and esophagus demonstrate no significant findings. Lungs/Pleura: Bilateral lower lobe atelectasis. Biapical pleural/pulmonary scarring. No focal consolidation. No pulmonary nodule. No pulmonary mass. No pleural effusion. No pneumothorax. Upper Abdomen: No acute abnormality. Musculoskeletal: No chest wall abnormality. No suspicious lytic or blastic osseous lesions. No acute displaced fracture. Anterior cervical discectomy and fusion of the C5  through C7 levels. Grade 1 anterolisthesis C7 on T1. Multilevel degenerative changes of the spine. Degenerative changes of bilateral shoulders. Review of the MIP images confirms the above findings. IMPRESSION: 1. No pulmonary embolus. 2. No acute intrathoracic abnormality. Electronically Signed   By: Iven Finn M.D.   On: 08/07/2022 20:19   MR BRAIN WO CONTRAST  Result Date: 08/07/2022 CLINICAL DATA:  Neuro deficit, acute, stroke suspected Intermittent head shaking that is new. Neurology recommend MRI brain without and EEG. EXAM: MRI HEAD WITHOUT CONTRAST TECHNIQUE: Multiplanar, multiecho pulse sequences of the brain and surrounding structures were obtained without intravenous contrast. COMPARISON:  MRI head December 21 2017. FINDINGS: Brain: No acute infarction, hemorrhage, hydrocephalus, extra-axial collection or mass lesion. Scattered T2/FLAIR hyperintensities the white matter, nonspecific but compatible chronic microvascular ischemic disease. Cerebral atrophy. Vascular: Major arterial flow voids are maintained at the skull base. Skull and upper cervical spine: Normal marrow signal. Sinuses/Orbits: Clear sinuses.  No acute orbital findings. Other: No mastoid effusions IMPRESSION: No evidence of acute intracranial abnormality. Electronically Signed   By: Margaretha Sheffield M.D.   On: 08/07/2022 17:46   CT HEAD WO CONTRAST (5MM)  Result Date: 08/07/2022 CLINICAL DATA:  Syncope. EXAM: CT HEAD WITHOUT CONTRAST TECHNIQUE: Contiguous axial images were obtained from the base of the skull through the vertex without intravenous contrast. RADIATION DOSE REDUCTION: This exam was performed according to the departmental dose-optimization program which includes automated exposure control, adjustment of the mA and/or kV according to patient size and/or use of iterative reconstruction technique. COMPARISON:  07/24/2021 FINDINGS: Brain: No evidence of acute infarction, hemorrhage, hydrocephalus, extra-axial collection  or mass lesion/mass effect. There is mild diffuse low-attenuation within the subcortical and periventricular white matter compatible with chronic microvascular disease. Prominence of the sulci and ventricles compatible with brain atrophy. Vascular: No hyperdense vessel or unexpected calcification. Skull: Normal. Negative for fracture or focal lesion. Sinuses/Orbits: No acute finding. Other: None. IMPRESSION: 1. No acute intracranial abnormalities. 2. Chronic microvascular disease and brain atrophy. Electronically Signed   By: Kerby Moors M.D.   On: 08/07/2022 12:40   DG Chest 2 View  Result Date: 08/07/2022 CLINICAL DATA:  Syncope EXAM: CHEST - 2 VIEW COMPARISON:  07/24/2021 FINDINGS: The heart size and mediastinal contours are within normal limits. Implantable loop recorder. Mild diffuse bilateral interstitial pulmonary opacity. Disc degenerative disease of the thoracic spine. IMPRESSION: Mild diffuse bilateral interstitial pulmonary opacity, consistent with edema or atypical/viral infection. No focal airspace opacity. Electronically Signed   By: Delanna Ahmadi M.D.   On: 08/07/2022 12:04    Assessment/Plan  1. Acute cough Likely viral with neg covid and flu Neg CXR Rest Encourage fluids Repeat Covid swab if not improving. [place] Covid vaccine 07/2021  2. Closed fracture of multiple ribs of left side, initial encounter Recommend IS 10 breaths QID Continues to work with therapy Pain controlled with current measures.    Family/ staff Communication: nurse  Labs/tests ordered:  CXR

## 2022-09-03 DIAGNOSIS — R55 Syncope and collapse: Secondary | ICD-10-CM | POA: Diagnosis not present

## 2022-09-03 DIAGNOSIS — Z8673 Personal history of transient ischemic attack (TIA), and cerebral infarction without residual deficits: Secondary | ICD-10-CM | POA: Diagnosis not present

## 2022-09-03 DIAGNOSIS — M5459 Other low back pain: Secondary | ICD-10-CM | POA: Diagnosis not present

## 2022-09-03 DIAGNOSIS — R2689 Other abnormalities of gait and mobility: Secondary | ICD-10-CM | POA: Diagnosis not present

## 2022-09-03 DIAGNOSIS — M6289 Other specified disorders of muscle: Secondary | ICD-10-CM | POA: Diagnosis not present

## 2022-09-03 DIAGNOSIS — R278 Other lack of coordination: Secondary | ICD-10-CM | POA: Diagnosis not present

## 2022-09-03 DIAGNOSIS — M48062 Spinal stenosis, lumbar region with neurogenic claudication: Secondary | ICD-10-CM | POA: Diagnosis not present

## 2022-09-03 DIAGNOSIS — F01B0 Vascular dementia, moderate, without behavioral disturbance, psychotic disturbance, mood disturbance, and anxiety: Secondary | ICD-10-CM | POA: Diagnosis not present

## 2022-09-04 DIAGNOSIS — R059 Cough, unspecified: Secondary | ICD-10-CM | POA: Diagnosis not present

## 2022-09-06 DIAGNOSIS — R55 Syncope and collapse: Secondary | ICD-10-CM | POA: Diagnosis not present

## 2022-09-06 DIAGNOSIS — M6289 Other specified disorders of muscle: Secondary | ICD-10-CM | POA: Diagnosis not present

## 2022-09-06 DIAGNOSIS — M5459 Other low back pain: Secondary | ICD-10-CM | POA: Diagnosis not present

## 2022-09-06 DIAGNOSIS — Z8673 Personal history of transient ischemic attack (TIA), and cerebral infarction without residual deficits: Secondary | ICD-10-CM | POA: Diagnosis not present

## 2022-09-06 DIAGNOSIS — F01B0 Vascular dementia, moderate, without behavioral disturbance, psychotic disturbance, mood disturbance, and anxiety: Secondary | ICD-10-CM | POA: Diagnosis not present

## 2022-09-06 DIAGNOSIS — R2689 Other abnormalities of gait and mobility: Secondary | ICD-10-CM | POA: Diagnosis not present

## 2022-09-06 DIAGNOSIS — M48062 Spinal stenosis, lumbar region with neurogenic claudication: Secondary | ICD-10-CM | POA: Diagnosis not present

## 2022-09-06 DIAGNOSIS — R278 Other lack of coordination: Secondary | ICD-10-CM | POA: Diagnosis not present

## 2022-09-07 DIAGNOSIS — F01B0 Vascular dementia, moderate, without behavioral disturbance, psychotic disturbance, mood disturbance, and anxiety: Secondary | ICD-10-CM | POA: Diagnosis not present

## 2022-09-07 DIAGNOSIS — M6289 Other specified disorders of muscle: Secondary | ICD-10-CM | POA: Diagnosis not present

## 2022-09-07 DIAGNOSIS — R55 Syncope and collapse: Secondary | ICD-10-CM | POA: Diagnosis not present

## 2022-09-08 DIAGNOSIS — M6289 Other specified disorders of muscle: Secondary | ICD-10-CM | POA: Diagnosis not present

## 2022-09-08 DIAGNOSIS — M48062 Spinal stenosis, lumbar region with neurogenic claudication: Secondary | ICD-10-CM | POA: Diagnosis not present

## 2022-09-08 DIAGNOSIS — F01B0 Vascular dementia, moderate, without behavioral disturbance, psychotic disturbance, mood disturbance, and anxiety: Secondary | ICD-10-CM | POA: Diagnosis not present

## 2022-09-08 DIAGNOSIS — R278 Other lack of coordination: Secondary | ICD-10-CM | POA: Diagnosis not present

## 2022-09-08 DIAGNOSIS — R55 Syncope and collapse: Secondary | ICD-10-CM | POA: Diagnosis not present

## 2022-09-08 DIAGNOSIS — M5459 Other low back pain: Secondary | ICD-10-CM | POA: Diagnosis not present

## 2022-09-08 DIAGNOSIS — R2689 Other abnormalities of gait and mobility: Secondary | ICD-10-CM | POA: Diagnosis not present

## 2022-09-08 DIAGNOSIS — Z8673 Personal history of transient ischemic attack (TIA), and cerebral infarction without residual deficits: Secondary | ICD-10-CM | POA: Diagnosis not present

## 2022-09-09 DIAGNOSIS — M6289 Other specified disorders of muscle: Secondary | ICD-10-CM | POA: Diagnosis not present

## 2022-09-09 DIAGNOSIS — F01B0 Vascular dementia, moderate, without behavioral disturbance, psychotic disturbance, mood disturbance, and anxiety: Secondary | ICD-10-CM | POA: Diagnosis not present

## 2022-09-09 DIAGNOSIS — R55 Syncope and collapse: Secondary | ICD-10-CM | POA: Diagnosis not present

## 2022-09-10 DIAGNOSIS — M5459 Other low back pain: Secondary | ICD-10-CM | POA: Diagnosis not present

## 2022-09-10 DIAGNOSIS — Z8673 Personal history of transient ischemic attack (TIA), and cerebral infarction without residual deficits: Secondary | ICD-10-CM | POA: Diagnosis not present

## 2022-09-10 DIAGNOSIS — M6289 Other specified disorders of muscle: Secondary | ICD-10-CM | POA: Diagnosis not present

## 2022-09-10 DIAGNOSIS — M48062 Spinal stenosis, lumbar region with neurogenic claudication: Secondary | ICD-10-CM | POA: Diagnosis not present

## 2022-09-10 DIAGNOSIS — R278 Other lack of coordination: Secondary | ICD-10-CM | POA: Diagnosis not present

## 2022-09-10 DIAGNOSIS — F01B0 Vascular dementia, moderate, without behavioral disturbance, psychotic disturbance, mood disturbance, and anxiety: Secondary | ICD-10-CM | POA: Diagnosis not present

## 2022-09-10 DIAGNOSIS — R55 Syncope and collapse: Secondary | ICD-10-CM | POA: Diagnosis not present

## 2022-09-10 DIAGNOSIS — R2689 Other abnormalities of gait and mobility: Secondary | ICD-10-CM | POA: Diagnosis not present

## 2022-09-13 DIAGNOSIS — R55 Syncope and collapse: Secondary | ICD-10-CM | POA: Diagnosis not present

## 2022-09-13 DIAGNOSIS — M5459 Other low back pain: Secondary | ICD-10-CM | POA: Diagnosis not present

## 2022-09-13 DIAGNOSIS — M48062 Spinal stenosis, lumbar region with neurogenic claudication: Secondary | ICD-10-CM | POA: Diagnosis not present

## 2022-09-13 DIAGNOSIS — R2689 Other abnormalities of gait and mobility: Secondary | ICD-10-CM | POA: Diagnosis not present

## 2022-09-13 DIAGNOSIS — R278 Other lack of coordination: Secondary | ICD-10-CM | POA: Diagnosis not present

## 2022-09-13 DIAGNOSIS — Z8673 Personal history of transient ischemic attack (TIA), and cerebral infarction without residual deficits: Secondary | ICD-10-CM | POA: Diagnosis not present

## 2022-09-13 DIAGNOSIS — F01B0 Vascular dementia, moderate, without behavioral disturbance, psychotic disturbance, mood disturbance, and anxiety: Secondary | ICD-10-CM | POA: Diagnosis not present

## 2022-09-13 DIAGNOSIS — M6289 Other specified disorders of muscle: Secondary | ICD-10-CM | POA: Diagnosis not present

## 2022-09-15 DIAGNOSIS — R55 Syncope and collapse: Secondary | ICD-10-CM | POA: Diagnosis not present

## 2022-09-15 DIAGNOSIS — Z8673 Personal history of transient ischemic attack (TIA), and cerebral infarction without residual deficits: Secondary | ICD-10-CM | POA: Diagnosis not present

## 2022-09-15 DIAGNOSIS — M5459 Other low back pain: Secondary | ICD-10-CM | POA: Diagnosis not present

## 2022-09-15 DIAGNOSIS — R278 Other lack of coordination: Secondary | ICD-10-CM | POA: Diagnosis not present

## 2022-09-15 DIAGNOSIS — M6289 Other specified disorders of muscle: Secondary | ICD-10-CM | POA: Diagnosis not present

## 2022-09-15 DIAGNOSIS — F01B0 Vascular dementia, moderate, without behavioral disturbance, psychotic disturbance, mood disturbance, and anxiety: Secondary | ICD-10-CM | POA: Diagnosis not present

## 2022-09-15 DIAGNOSIS — M48062 Spinal stenosis, lumbar region with neurogenic claudication: Secondary | ICD-10-CM | POA: Diagnosis not present

## 2022-09-15 DIAGNOSIS — R2689 Other abnormalities of gait and mobility: Secondary | ICD-10-CM | POA: Diagnosis not present

## 2022-09-16 DIAGNOSIS — R55 Syncope and collapse: Secondary | ICD-10-CM | POA: Diagnosis not present

## 2022-09-16 DIAGNOSIS — M6289 Other specified disorders of muscle: Secondary | ICD-10-CM | POA: Diagnosis not present

## 2022-09-16 DIAGNOSIS — F01B0 Vascular dementia, moderate, without behavioral disturbance, psychotic disturbance, mood disturbance, and anxiety: Secondary | ICD-10-CM | POA: Diagnosis not present

## 2022-09-17 DIAGNOSIS — R278 Other lack of coordination: Secondary | ICD-10-CM | POA: Diagnosis not present

## 2022-09-17 DIAGNOSIS — M48062 Spinal stenosis, lumbar region with neurogenic claudication: Secondary | ICD-10-CM | POA: Diagnosis not present

## 2022-09-17 DIAGNOSIS — Z8673 Personal history of transient ischemic attack (TIA), and cerebral infarction without residual deficits: Secondary | ICD-10-CM | POA: Diagnosis not present

## 2022-09-17 DIAGNOSIS — R55 Syncope and collapse: Secondary | ICD-10-CM | POA: Diagnosis not present

## 2022-09-17 DIAGNOSIS — F01B0 Vascular dementia, moderate, without behavioral disturbance, psychotic disturbance, mood disturbance, and anxiety: Secondary | ICD-10-CM | POA: Diagnosis not present

## 2022-09-17 DIAGNOSIS — M5459 Other low back pain: Secondary | ICD-10-CM | POA: Diagnosis not present

## 2022-09-17 DIAGNOSIS — R2689 Other abnormalities of gait and mobility: Secondary | ICD-10-CM | POA: Diagnosis not present

## 2022-09-21 DIAGNOSIS — Z8673 Personal history of transient ischemic attack (TIA), and cerebral infarction without residual deficits: Secondary | ICD-10-CM | POA: Diagnosis not present

## 2022-09-21 DIAGNOSIS — R2689 Other abnormalities of gait and mobility: Secondary | ICD-10-CM | POA: Diagnosis not present

## 2022-09-21 DIAGNOSIS — R278 Other lack of coordination: Secondary | ICD-10-CM | POA: Diagnosis not present

## 2022-09-21 DIAGNOSIS — M5459 Other low back pain: Secondary | ICD-10-CM | POA: Diagnosis not present

## 2022-09-21 DIAGNOSIS — R55 Syncope and collapse: Secondary | ICD-10-CM | POA: Diagnosis not present

## 2022-09-21 DIAGNOSIS — M48062 Spinal stenosis, lumbar region with neurogenic claudication: Secondary | ICD-10-CM | POA: Diagnosis not present

## 2022-09-21 DIAGNOSIS — F01B0 Vascular dementia, moderate, without behavioral disturbance, psychotic disturbance, mood disturbance, and anxiety: Secondary | ICD-10-CM | POA: Diagnosis not present

## 2022-09-22 DIAGNOSIS — M6289 Other specified disorders of muscle: Secondary | ICD-10-CM | POA: Diagnosis not present

## 2022-09-22 DIAGNOSIS — R55 Syncope and collapse: Secondary | ICD-10-CM | POA: Diagnosis not present

## 2022-09-22 DIAGNOSIS — F01B0 Vascular dementia, moderate, without behavioral disturbance, psychotic disturbance, mood disturbance, and anxiety: Secondary | ICD-10-CM | POA: Diagnosis not present

## 2022-09-23 DIAGNOSIS — R55 Syncope and collapse: Secondary | ICD-10-CM | POA: Diagnosis not present

## 2022-09-23 DIAGNOSIS — M6289 Other specified disorders of muscle: Secondary | ICD-10-CM | POA: Diagnosis not present

## 2022-09-23 DIAGNOSIS — F01B0 Vascular dementia, moderate, without behavioral disturbance, psychotic disturbance, mood disturbance, and anxiety: Secondary | ICD-10-CM | POA: Diagnosis not present

## 2022-09-24 DIAGNOSIS — F01B0 Vascular dementia, moderate, without behavioral disturbance, psychotic disturbance, mood disturbance, and anxiety: Secondary | ICD-10-CM | POA: Diagnosis not present

## 2022-09-24 DIAGNOSIS — R55 Syncope and collapse: Secondary | ICD-10-CM | POA: Diagnosis not present

## 2022-09-24 DIAGNOSIS — M6289 Other specified disorders of muscle: Secondary | ICD-10-CM | POA: Diagnosis not present

## 2022-09-29 DIAGNOSIS — R278 Other lack of coordination: Secondary | ICD-10-CM | POA: Diagnosis not present

## 2022-09-29 DIAGNOSIS — R293 Abnormal posture: Secondary | ICD-10-CM | POA: Diagnosis not present

## 2022-09-30 DIAGNOSIS — R293 Abnormal posture: Secondary | ICD-10-CM | POA: Diagnosis not present

## 2022-09-30 DIAGNOSIS — R278 Other lack of coordination: Secondary | ICD-10-CM | POA: Diagnosis not present

## 2022-10-01 ENCOUNTER — Encounter: Payer: Self-pay | Admitting: Adult Health

## 2022-10-01 ENCOUNTER — Non-Acute Institutional Stay (SKILLED_NURSING_FACILITY): Payer: Medicare Other | Admitting: Adult Health

## 2022-10-01 DIAGNOSIS — R278 Other lack of coordination: Secondary | ICD-10-CM | POA: Diagnosis not present

## 2022-10-01 DIAGNOSIS — E782 Mixed hyperlipidemia: Secondary | ICD-10-CM | POA: Diagnosis not present

## 2022-10-01 DIAGNOSIS — R251 Tremor, unspecified: Secondary | ICD-10-CM

## 2022-10-01 DIAGNOSIS — I679 Cerebrovascular disease, unspecified: Secondary | ICD-10-CM | POA: Diagnosis not present

## 2022-10-01 DIAGNOSIS — F419 Anxiety disorder, unspecified: Secondary | ICD-10-CM | POA: Diagnosis not present

## 2022-10-01 DIAGNOSIS — F0150 Vascular dementia without behavioral disturbance: Secondary | ICD-10-CM

## 2022-10-01 DIAGNOSIS — M48062 Spinal stenosis, lumbar region with neurogenic claudication: Secondary | ICD-10-CM | POA: Diagnosis not present

## 2022-10-01 DIAGNOSIS — R293 Abnormal posture: Secondary | ICD-10-CM | POA: Diagnosis not present

## 2022-10-01 NOTE — Progress Notes (Unsigned)
Location:   [place] Room Number: 146-A Place of Service:  SNF [phone]) Provider:  Royal Hawthorn, NP  PCP: Virgie Dad, MD  Patient Care Team: Virgie Dad, MD as PCP - General (Internal Medicine) Nahser, Wonda Cheng, MD as PCP - Cardiology (Cardiology) Clarene Essex, MD as Consulting Physician (Gastroenterology) Marcial Pacas, MD as Consulting Physician (Neurology) Kristeen Miss, MD as Consulting Physician (Neurosurgery) Martinique, Amy, MD as Consulting Physician (Dermatology) Megan Salon, MD as Consulting Physician (Gynecology) [place], Heather, Rogers (Optometry) Virgie Dad, MD (Internal Medicine)  Extended Emergency Contact Information Primary Emergency Contact: Manske,Robert J Address: [address]          [place], [place] [address] [name] of [place] Phone: [phone] Work Phone: [phone] Mobile Phone: [phone] Relation: Spouse Secondary Emergency Contact: Quitman Livings of [place] Phone: [phone] Mobile Phone: [phone] Relation: Son  Code Status:  DNR Goals of care: Advanced Directive information    10/01/2022    9:22 AM  Advanced Directives  Does Patient Have a Medical Advance Directive? Yes  Type of Paramedic of [place];Living will;Out of facility DNR (pink MOST or yellow form)  Does patient want to make changes to medical advance directive? No - Patient declined  Copy of [place] in Chart? Yes - validated most recent copy scanned in chart (See row information)     Chief Complaint  Patient presents with   Medical Management of Chronic Issues    Skilled Routine Visit.   Immunizations    Discuss the need for Covid Booster, and Dtap vaccine.  Pt family declined covid booster.  Tdap updated 10/17/13 [facility].   HPI:  Pt is a 82 y.o. female seen today for medical management of chronic diseases.   PMH significant for  vascular dementia, CVA in 2018, hypothyroid, HLD, chronic back pain with decompression laminectomy 06/08/21 Ms Isaacs recently moved to skilled care for assisted living due to a fall on 11/19 with back pain and rib pain. CXR revealed compression fracture to T8/T9 of indeterminate age, left 4th and 6th ribs fractured, mild left pleural effusion also noted.  Due to incontinence and memory loss it was recommended that she considered permanent skilled care.   She seems to have adjusted well and is very pleasant. Has advancing dementia but no behavioral issues. Has chronic anxiety and uses prn clonazepam. We tried buspar but this was discontinued due to lack of benefit and possible s/e.  Had vertigo and took meclizine in Nov, no current issues  Hospitalized for syncope Nov 2023. No acute findings, neg head CT and EKG, thought to be vasovagal.  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 Street of back injections by Dr. Brien Few 2016 with good relief, history of cervical DD with possiblity of surgery by Dr. Trenton Gammon.   GERD (gastroesophageal reflux disease)    IBS with moderate refluc seen on upper GI study form January 2011 Dr. Watt Climes   HTN (hypertension)    no meds   Hyperlipidemia    Hypothyroidism  IBS (irritable bowel syndrome)    Imbalance    Impingement syndrome of right shoulder 01/05/2013   Mass of left side of neck    Memory loss    Mood disorder (HCC)    MVP (mitral valve prolapse)    Hx of    Osteopenia    Peripheral edema    Right rotator cuff tear 01/05/2013   Sciatic pain    recurrent   Seasonal allergies    Stroke [facility])    Tension  headache    TIA (transient ischemic attack)    Tremor    Past Surgical History:  Procedure Laterality Date   ANTERIOR CERVICAL DECOMP/DISCECTOMY FUSION  04/2015    Dr. Pamala Hurry, [place]  2004   which revealed smooth and normal coronary arteries   CARPAL TUNNEL RELEASE  2000   lt   CATARACT EXTRACTION W/ INTRAOCULAR LENS  IMPLANT, BILATERAL  1/17   Dr. Lucita Ferrara   COSMETIC SURGERY  2010   eyes-facial   DENTAL SURGERY  1960   LASIK     LOOP RECORDER INSERTION N/A 09/22/2017   Procedure: LOOP RECORDER INSERTION;  Surgeon: Constance Haw, MD;  Location: [place] [facility];  Service: Cardiovascular;  Laterality: N/A;   LUMBAR LAMINECTOMY/DECOMPRESSION MICRODISCECTOMY N/A 06/08/2021   Procedure: Laminectomy and Foraminotomy - L4-L5, L5-S1;  Surgeon: Earnie Larsson, MD;  Location: [place];  Service: Neurosurgery;  Laterality: N/A;   SHOULDER ARTHROSCOPY WITH ROTATOR CUFF REPAIR Right 01/05/2013   Procedure: SHOULDER ARTHROSCOPY WITH ARTHROSCOPIC  ROTATOR CUFF REPAIR, ACROMIOPLASTY, EXTENSIVE DEBRIDEMENT;  Surgeon: Johnny Bridge, MD;  Location: [place];  Service: Orthopedics;  Laterality: Right;   TEE WITHOUT CARDIOVERSION N/A 09/22/2017   Procedure: TRANSESOPHAGEAL ECHOCARDIOGRAM (TEE);  Surgeon: Sanda Klein, MD;  [facility];  Service: Cardiovascular;  Laterality: N/A;   TONSILLECTOMY     TUBAL LIGATION      Allergies  Allergen Reactions   Fluticasone Other (See Comments)    DRY EYES   Aciphex [Rabeprazole] Other (See Comments)    Critical    Codeine Nausea Only   Codeine Other (See Comments)    unknown   Cymbalta [Duloxetine Hcl] Other (See Comments)    Critical    Lexapro [Escitalopram] Other (See Comments)    critical   Lipitor [Atorvastatin Calcium] Other (See Comments)    Intolerant    Morphine Sulfate     Per matrix   Pneumovax [Pneumococcal Polysaccharide Vaccine] Other (See Comments)    moderate   Prevacid  [Lansoprazole] Other (See Comments)    Unknown, listed on MAR   Rosuvastatin Other (See Comments)    Intolerant    Sertraline Other (See Comments)    critical   Statins Other (See Comments)    Critical    Sulfa Antibiotics Nausea And Vomiting   Tetracyclines & Related Other (See Comments)    Morphine Sulfate   Welchol [Colesevelam] Other (See Comments)    Unknown, listed on MAR   Pneumovax 23 [Pneumococcal Vac Polyvalent] Rash    Injection site reaction    Allergies as of 10/01/2022       Reactions   Fluticasone Other (See Comments)   DRY EYES   Aciphex [rabeprazole] Other (See Comments)   Critical    Codeine Nausea Only   Codeine Other (See Comments)   unknown   Cymbalta [duloxetine Hcl] Other (See Comments)   Critical   Lexapro [escitalopram] Other (See Comments)   critical   Lipitor [  atorvastatin Calcium] Other (See Comments)   Intolerant    Morphine Sulfate    Per matrix   Pneumovax [pneumococcal Polysaccharide Vaccine] Other (See Comments)   moderate   Prevacid [lansoprazole] Other (See Comments)   Unknown, listed on MAR   Rosuvastatin Other (See Comments)   Intolerant    Sertraline Other (See Comments)   critical   Statins Other (See Comments)   Critical   Sulfa Antibiotics Nausea And Vomiting   Tetracyclines & Related Other (See Comments)   Morphine Sulfate   Welchol [colesevelam] Other (See Comments)   Unknown, listed on MAR   Pneumovax 23 [pneumococcal Vac Polyvalent] Rash   Injection site reaction        Medication List        Accurate as of October 01, 2022  9:30 AM. If you have any questions, ask your nurse or doctor.          STOP taking these medications    Salonpas Pain Relieving 4 % Generic drug: lidocaine Stopped by: Royal Hawthorn, NP       TAKE these medications    acetaminophen 325 MG tablet Commonly known as: TYLENOL Take 650 mg by mouth See admin instructions. Take 650 mg 3 times daily, may take 650 mg 2 times daily as  needed for pain   acetaminophen 325 MG tablet Commonly known as: TYLENOL Take 650 mg by mouth as needed.   CALCIUM 500 + D PO Take 1 tablet by mouth daily.   calcium carbonate 500 MG chewable tablet Commonly known as: TUMS - dosed in mg elemental calcium Chew 2 tablets by mouth 2 (two) times daily as needed for indigestion or heartburn.   clonazePAM 0.5 MG tablet Commonly known as: KLONOPIN Take 0.5 tablets (0.25 mg total) by mouth 2 (two) times daily as needed (for anxiety and shakiness).   clopidogrel 75 MG tablet Commonly known as: PLAVIX Take 1 tablet (75 mg total) by mouth daily.   donepezil 10 MG tablet Commonly known as: ARICEPT Take 1 tablet (10 mg total) by mouth daily.   gabapentin 100 MG capsule Commonly known as: NEURONTIN Take 200 mg by mouth 3 (three) times daily.   HYDROcodone-acetaminophen 5-325 MG tablet Commonly known as: NORCO/VICODIN Take 0.5 tablets by mouth every 6 (six) hours as needed.   levothyroxine 88 MCG tablet Commonly known as: SYNTHROID Take 88 mcg by mouth daily before breakfast.   melatonin 5 MG Tabs Take 5 mg by mouth at bedtime as needed (sleep).   memantine 5 MG tablet Commonly known as: NAMENDA Take 5 mg by mouth 2 (two) times daily.   mirabegron ER 25 MG Tb24 tablet Commonly known as: Myrbetriq Take 1 tablet (25 mg total) by mouth daily.   omeprazole 40 MG capsule Commonly known as: PRILOSEC Take 40 mg by mouth daily.   PRESERVISION AREDS PO Take 1 tablet by mouth 2 (two) times daily.   propranolol 20 MG tablet Commonly known as: INDERAL Take 20 mg by mouth 2 (two) times daily. 1 tablet (20 mg) oral, Twice A Day   rosuvastatin 5 MG tablet Commonly known as: CRESTOR Take 1 tablet (5 mg total) by mouth daily.   Voltaren 1 % Gel Generic drug: diclofenac Sodium Apply 2 g topically 2 (two) times daily as needed (pain).        Review of Systems  Constitutional:  Negative for activity change, appetite change,  chills, diaphoresis, fatigue, fever and unexpected weight change.  HENT:  Negative for congestion.  Respiratory:  Negative for cough, shortness of breath and wheezing.   Cardiovascular:  Negative for chest pain, palpitations and leg swelling.  Gastrointestinal:  Negative for abdominal distention, abdominal pain, constipation and diarrhea.  Genitourinary:  Negative for difficulty urinating and dysuria.  Musculoskeletal:  Positive for back pain and gait problem. Negative for arthralgias, joint swelling and myalgias.  Neurological:  Positive for tremors. Negative for dizziness, seizures, syncope, facial asymmetry, speech difficulty, weakness, light-headedness, numbness and headaches.  Psychiatric/Behavioral:  Positive for confusion. Negative for agitation and behavioral problems. The patient is nervous/anxious.     Immunization History  Administered Date(s) Administered   Fluad Quad(high Dose 65+) 07/02/2022   Influenza, High Dose Seasonal PF 07/23/2017, 07/03/2019   Influenza-Unspecified 05/27/2010, 08/03/2010, 07/22/2011, 07/01/2012, 06/22/2013, 07/18/2020, 07/11/2021, 07/02/2022   Janssen (J&J) SARS-COV-2 Vaccination 11/06/2019   Moderna SARS-COV2 Booster Vaccination 08/05/2021   Moderna Sars-Covid-2 Vaccination 08/07/2020   PFIZER(Purple Top)SARS-COV-2 Vaccination 10/09/2019, 08/07/2020   Pneumococcal Conjugate-13 02/08/2015   Pneumococcal Polysaccharide-23 05/28/2006, 10/13/2011   Pneumococcal-Unspecified 10/17/2013   Td 03/29/2002   Tdap 09/27/2001, 04/25/2002, 08/18/2012   Zoster Recombinat (Shingrix) 05/23/2021, 07/23/2021   Zoster, Live 08/30/2006   Zoster, Unspecified 08/30/2006   Pertinent  Health Maintenance Due  Topic Date Due   INFLUENZA VACCINE  Completed   DEXA SCAN  Completed      08/07/2022   11:17 AM 08/07/2022   11:03 PM 08/08/2022    8:00 PM 08/09/2022    8:00 AM 08/17/2022   10:59 AM  Fall Risk  Falls in the past year?     1  Was there an injury with  Fall?     1  Fall Risk Category Calculator     3  Fall Risk Category     High  Patient Fall Risk Level High fall risk High fall risk High fall risk High fall risk High fall risk  Patient at Risk for Falls Due to     History of fall(s);Impaired balance/gait;Impaired mobility  Fall risk Follow up     Falls evaluation completed   Functional Status Survey:    Vitals:   10/01/22 0915  BP: (!) 149/66  Pulse: 71  Resp: 17  Temp: (!) 97.5 F (36.4 C)  SpO2: 94%  Weight: 151 lb (68.5 kg)  Height: '5\' 1"'$  (1.549 m)   Body mass index is 28.53 kg/m. Physical Exam Vitals and nursing note reviewed.  Constitutional:      General: She is not in acute distress.    Appearance: She is not diaphoretic.  HENT:     Head: Normocephalic and atraumatic.  Neck:     Vascular: No JVD.  Cardiovascular:     Rate and Rhythm: Normal rate and regular rhythm.     Heart sounds: No murmur heard. Pulmonary:     Effort: Pulmonary effort is normal. No respiratory distress.     Breath sounds: Normal breath sounds. No wheezing.  Abdominal:     General: Abdomen is flat. Bowel sounds are normal.     Palpations: Abdomen is soft.  Musculoskeletal:     Cervical back: No rigidity or tenderness.     Right lower leg: No edema.     Left lower leg: No edema.  Lymphadenopathy:     Cervical: No cervical adenopathy.  Skin:    General: Skin is warm and dry.  Neurological:     General: No focal deficit present.     Mental Status: She is alert. Mental status is at baseline.  Psychiatric:  Mood and Affect: Mood normal.     Labs reviewed: Recent Labs    08/07/22 1122 08/07/22 1615 08/07/22 2343 08/16/22 0000 08/16/22 1300  NA 141  --  142 142 142  K 4.2  --  3.5 4.1 4.1  CL 104  --   --  104 104  CO2 29  --   --  23* 23*  GLUCOSE 99  --   --   --   --   BUN 14  --   --  10 10  CREATININE 0.95  --   --  0.7 0.7  CALCIUM 9.2  --   --  9.3 9.3  MG  --  2.2  --   --   --   PHOS  --  4.0  --   --   --     Recent Labs    08/07/22 1615 08/16/22 0000 08/16/22 1300  AST '20 16 16  '$ ALT '14 14 14  '$ ALKPHOS 69 100 100  BILITOT 0.5  --   --   PROT 6.8  --   --   ALBUMIN 3.6 4.0 4.0   Recent Labs    03/22/22 0000 07/20/22 0000 08/07/22 1122 08/07/22 2343  WBC 5.9 5.8 9.9  --   NEUTROABS  --   --  7.5  --   HGB 13.6 14.5 14.1 12.6  HCT 41 42 43.4 37.0  MCV  --   --  98.9  --   PLT 238 224 301  --    Lab Results  Component Value Date   TSH 1.147 08/07/2022   Lab Results  Component Value Date   HGBA1C 5.6 09/21/2017   Lab Results  Component Value Date   CHOL 174 03/23/2022   HDL 66 03/23/2022   LDLCALC 81 03/23/2022   TRIG 134 03/23/2022   CHOLHDL 2.1 09/21/2017    Significant Diagnostic Results in last 30 days:  No results found.  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   Tremor Continue Inderal 20 mg bid   Cerebrovascular disease  Prior hx of CVA Now with memory loss On plavix and crestor  Vascular dementia without behavioral disturbance ([place]) Progressive decline in cognition and physical function c/w the disease. Continue supportive care in the skilled environment. Continue aricept and namenda.    Labs/tests ordered:  NA

## 2022-10-05 DIAGNOSIS — R278 Other lack of coordination: Secondary | ICD-10-CM | POA: Diagnosis not present

## 2022-10-05 DIAGNOSIS — R293 Abnormal posture: Secondary | ICD-10-CM | POA: Diagnosis not present

## 2022-10-07 DIAGNOSIS — R293 Abnormal posture: Secondary | ICD-10-CM | POA: Diagnosis not present

## 2022-10-07 DIAGNOSIS — R278 Other lack of coordination: Secondary | ICD-10-CM | POA: Diagnosis not present

## 2022-10-13 DIAGNOSIS — R293 Abnormal posture: Secondary | ICD-10-CM | POA: Diagnosis not present

## 2022-10-13 DIAGNOSIS — R278 Other lack of coordination: Secondary | ICD-10-CM | POA: Diagnosis not present

## 2022-10-15 DIAGNOSIS — R278 Other lack of coordination: Secondary | ICD-10-CM | POA: Diagnosis not present

## 2022-10-15 DIAGNOSIS — R293 Abnormal posture: Secondary | ICD-10-CM | POA: Diagnosis not present

## 2022-10-18 ENCOUNTER — Telehealth: Payer: Medicare Other | Admitting: Adult Health

## 2022-10-18 DIAGNOSIS — F419 Anxiety disorder, unspecified: Secondary | ICD-10-CM

## 2022-10-18 MED ORDER — CLONAZEPAM 0.5 MG PO TABS: 0.2500 mg | ORAL_TABLET | Freq: Every day | ORAL | 0 refills | Status: DC

## 2022-10-18 NOTE — Telephone Encounter (Signed)
Nurse requesting order to schedule clonazepam due to increased anxiety. Order given and keep prn

## 2022-10-20 DIAGNOSIS — R278 Other lack of coordination: Secondary | ICD-10-CM | POA: Diagnosis not present

## 2022-10-20 DIAGNOSIS — R293 Abnormal posture: Secondary | ICD-10-CM | POA: Diagnosis not present

## 2022-10-22 DIAGNOSIS — R293 Abnormal posture: Secondary | ICD-10-CM | POA: Diagnosis not present

## 2022-10-22 DIAGNOSIS — R278 Other lack of coordination: Secondary | ICD-10-CM | POA: Diagnosis not present

## 2022-10-27 DIAGNOSIS — R278 Other lack of coordination: Secondary | ICD-10-CM | POA: Diagnosis not present

## 2022-10-27 DIAGNOSIS — R293 Abnormal posture: Secondary | ICD-10-CM | POA: Diagnosis not present

## 2022-11-01 ENCOUNTER — Encounter: Payer: Self-pay | Admitting: Adult Health

## 2022-11-01 ENCOUNTER — Encounter: Payer: Self-pay | Admitting: Internal Medicine

## 2022-11-01 ENCOUNTER — Non-Acute Institutional Stay (SKILLED_NURSING_FACILITY): Payer: Medicare Other | Admitting: Adult Health

## 2022-11-01 DIAGNOSIS — M25552 Pain in left hip: Secondary | ICD-10-CM

## 2022-11-01 DIAGNOSIS — R293 Abnormal posture: Secondary | ICD-10-CM | POA: Diagnosis not present

## 2022-11-01 DIAGNOSIS — R278 Other lack of coordination: Secondary | ICD-10-CM | POA: Diagnosis not present

## 2022-11-01 MED ORDER — HYDROCODONE-ACETAMINOPHEN 5-325 MG PO TABS: 0.5000 | ORAL_TABLET | Freq: Four times a day (QID) | ORAL | 0 refills | Status: DC | PRN

## 2022-11-01 NOTE — Progress Notes (Signed)
Location:  [facility] Room Number: 146A Place of Service:  SNF [phone]) Provider:  Royal Hawthorn , NP  Virgie Dad, MD  Patient Care Team: Virgie Dad, MD as PCP - General (Internal Medicine) Nahser, Wonda Cheng, MD as PCP - Cardiology (Cardiology) Clarene Essex, MD as Consulting Physician (Gastroenterology) Marcial Pacas, MD as Consulting Physician (Neurology) Kristeen Miss, MD as Consulting Physician (Neurosurgery) Martinique, Amy, MD as Consulting Physician (Dermatology) Megan Salon, MD as Consulting Physician (Gynecology) [place], Heather, [place] (Optometry) Virgie Dad, MD (Internal Medicine)  Extended Emergency Contact Information Primary Emergency Contact: Dorff,Robert J Address: [address]          [place], [place] [address] [name] of [place] Phone: [phone] Work Phone: [phone] Mobile Phone: [phone] Relation: Spouse Secondary Emergency Contact: Quitman Livings of [place] Phone: [phone] Mobile Phone: [phone] Relation: Son  Code Status:  DNR Goals of care: Advanced Directive information    11/01/2022   11:47 AM  Advanced Directives  Does Patient Have a Medical Advance Directive? Yes  Type of Paramedic of [place];Living will;Out of facility DNR (pink MOST or yellow form)  Does patient want to make changes to medical advance directive? No - Patient declined  Copy of [place] in Chart? Yes - validated most recent copy scanned in chart (See row information)     Chief Complaint  Patient presents with   Acute Visit    Patient is being seen for hip pain   Immunizations    Discussed the need for Covid immunization    HPI:  Pt is a 82 y.o. female seen today for an acute visit for left hip pain  Slid out of her chair on 1/26. Unwitnessed fall and pt can not contribute to hx.  Her pain is worse with movement. The nurse has tried  tylenol but this did not help. She is slouching in her wheelchair and the nurse reports they are working on getting her a more supportive chair with OT  She was on norco in Dec but this was discontinued due to non use.   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 Street of back injections by Dr. Brien Few 2016 with good relief, history of cervical DD with possiblity of surgery by Dr. Trenton Gammon.   GERD (gastroesophageal reflux disease)    IBS with moderate refluc seen on upper GI study form January 2011 Dr. Watt Climes   HTN (hypertension)    no meds   Hyperlipidemia    Hypothyroidism    IBS (irritable bowel syndrome)    Imbalance    Impingement syndrome of right shoulder 01/05/2013   Mass of left side of neck    Memory loss    Mood disorder ([place])    MVP (mitral valve prolapse)    Hx of    Osteopenia    Peripheral edema    Right rotator cuff tear 01/05/2013   Sciatic pain    recurrent   Seasonal allergies    Stroke ([place])  Tension headache    TIA (transient ischemic attack)    Tremor    Past Surgical History:  Procedure Laterality Date   ANTERIOR CERVICAL DECOMP/DISCECTOMY FUSION  04/2015    Dr. Pamala Hurry, [place]  2004   which revealed smooth and normal coronary arteries   CARPAL TUNNEL RELEASE  2000   lt   CATARACT EXTRACTION W/ INTRAOCULAR LENS  IMPLANT, BILATERAL  1/17   Dr. Lucita Ferrara   COSMETIC SURGERY  2010   eyes-facial   DENTAL SURGERY  1960   LASIK     LOOP RECORDER INSERTION N/A 09/22/2017   Procedure: LOOP RECORDER INSERTION;  Surgeon: Constance Haw, MD;  Location: [facility] LAB;   Service: Cardiovascular;  Laterality: N/A;   LUMBAR LAMINECTOMY/DECOMPRESSION MICRODISCECTOMY N/A 06/08/2021   Procedure: Laminectomy and Foraminotomy - L4-L5, L5-S1;  Surgeon: Earnie Larsson, MD;  Location: [facility];  Service: Neurosurgery;  Laterality: N/A;   SHOULDER ARTHROSCOPY WITH ROTATOR CUFF REPAIR Right 01/05/2013   Procedure: SHOULDER ARTHROSCOPY WITH ARTHROSCOPIC  ROTATOR CUFF REPAIR, ACROMIOPLASTY, EXTENSIVE DEBRIDEMENT;  Surgeon: Johnny Bridge, MD;  Location: [facility];  Service: Orthopedics;  Laterality: Right;   TEE WITHOUT CARDIOVERSION N/A 09/22/2017   Procedure: TRANSESOPHAGEAL ECHOCARDIOGRAM (TEE);  Surgeon: Sanda Klein, MD;  [facility];  Service: Cardiovascular;  Laterality: N/A;   TONSILLECTOMY     TUBAL LIGATION      Allergies  Allergen Reactions   Fluticasone Other (See Comments)    DRY EYES   Aciphex [Rabeprazole] Other (See Comments)    Critical    Codeine Nausea Only   Codeine Other (See Comments)    unknown   Cymbalta [Duloxetine Hcl] Other (See Comments)    Critical    Lexapro [Escitalopram] Other (See Comments)    critical   Lipitor [Atorvastatin Calcium] Other (See Comments)    Intolerant    Morphine Sulfate     Per matrix   Pneumovax [Pneumococcal Polysaccharide Vaccine] Other (See Comments)    moderate   Prevacid [Lansoprazole] Other (See Comments)    Unknown, listed on MAR   Rosuvastatin Other (See Comments)    Intolerant    Sertraline Other (See Comments)    critical   Statins Other (See Comments)    Critical    Sulfa Antibiotics Nausea And Vomiting   Tetracyclines & Related Other (See Comments)    Morphine Sulfate   Welchol [Colesevelam] Other (See Comments)    Unknown, listed on MAR   Pneumovax 23 [Pneumococcal Vac Polyvalent] Rash    Injection site reaction    Outpatient Encounter Medications as of 11/01/2022  Medication Sig   acetaminophen (TYLENOL) 325 MG tablet Take 650 mg by mouth See admin  instructions. Take 650 mg 3 times daily, may take 650 mg 2 times daily as needed for pain   acetaminophen (TYLENOL) 325 MG tablet Take 650 mg by mouth as needed.   Calcium Carb-Cholecalciferol (CALCIUM 500 + D PO) Take 1 tablet by mouth daily.   calcium carbonate (TUMS - DOSED IN MG ELEMENTAL CALCIUM) 500 MG chewable tablet Chew 2 tablets by mouth 2 (two) times daily as needed for indigestion or heartburn.   clonazePAM (KLONOPIN) 0.5 MG tablet Take 0.5 tablets (0.25 mg total) by mouth 2 (two) times daily as needed (for anxiety and shakiness).   clonazePAM (KLONOPIN) 0.5 MG tablet Take 0.5 tablets (0.25 mg total) by mouth at bedtime.   clopidogrel (PLAVIX) 75 MG tablet Take 1 tablet (75 mg total)  by mouth daily.   diclofenac Sodium (VOLTAREN) 1 % GEL Apply 2 g topically 2 (two) times daily as needed (pain).   donepezil (ARICEPT) 10 MG tablet Take 1 tablet (10 mg total) by mouth daily.   gabapentin (NEURONTIN) 100 MG capsule Take 200 mg by mouth 3 (three) times daily.   levothyroxine (SYNTHROID) 88 MCG tablet Take 88 mcg by mouth daily before breakfast.   melatonin 5 MG TABS Take 5 mg by mouth at bedtime as needed (sleep).   memantine (NAMENDA) 5 MG tablet Take 5 mg by mouth 2 (two) times daily.   mirabegron ER (MYRBETRIQ) 25 MG TB24 tablet Take 1 tablet (25 mg total) by mouth daily.   Multiple Vitamins-Minerals (PRESERVISION AREDS PO) Take 1 tablet by mouth 2 (two) times daily.    omeprazole (PRILOSEC) 40 MG capsule Take 40 mg by mouth daily.   propranolol (INDERAL) 20 MG tablet Take 20 mg by mouth 2 (two) times daily. 1 tablet (20 mg) oral, Twice A Day   rosuvastatin (CRESTOR) 5 MG tablet Take 1 tablet (5 mg total) by mouth daily.   HYDROcodone-acetaminophen (NORCO/VICODIN) 5-325 MG tablet Take 0.5 tablets by mouth every 6 (six) hours as needed. (Patient not taking: Reported on 11/01/2022)   No facility-administered encounter medications on file as of 11/01/2022.    Review of Systems   Constitutional:  Positive for activity change. Negative for appetite change, chills, diaphoresis, fatigue, fever and unexpected weight change.  HENT:  Negative for congestion.   Respiratory:  Negative for cough, shortness of breath and wheezing.   Cardiovascular:  Negative for chest pain, palpitations and leg swelling.  Gastrointestinal:  Negative for abdominal distention, abdominal pain, constipation and diarrhea.  Genitourinary:  Negative for difficulty urinating and dysuria.  Musculoskeletal:  Positive for back pain and gait problem. Negative for arthralgias, joint swelling and myalgias.       Left hip pain  Neurological:  Negative for dizziness, tremors, seizures, syncope, facial asymmetry, speech difficulty, weakness, light-headedness, numbness and headaches.  Psychiatric/Behavioral:  Positive for confusion. Negative for agitation and behavioral problems.     Immunization History  Administered Date(s) Administered   Fluad Quad(high Dose 65+) 07/02/2022   Influenza, High Dose Seasonal PF 07/23/2017, 07/03/2019   Influenza-Unspecified 05/27/2010, 08/03/2010, 07/22/2011, 07/01/2012, 06/22/2013, 07/18/2020, 07/11/2021, 07/02/2022   Janssen (J&J) SARS-COV-2 Vaccination 11/06/2019   Moderna SARS-COV2 Booster Vaccination 08/05/2021   Moderna Sars-Covid-2 Vaccination 08/07/2020   PFIZER(Purple Top)SARS-COV-2 Vaccination 10/09/2019, 08/07/2020   Pneumococcal Conjugate-13 02/08/2015   Pneumococcal Polysaccharide-23 05/28/2006, 10/13/2011   Pneumococcal-Unspecified 10/17/2013   Td 03/29/2002   Tdap 09/27/2001, 04/25/2002, 08/18/2012, 10/17/2013   Zoster Recombinat (Shingrix) 05/23/2021, 07/23/2021   Zoster, Live 08/30/2006   Zoster, Unspecified 08/30/2006   Pertinent  Health Maintenance Due  Topic Date Due   INFLUENZA VACCINE  Completed   DEXA SCAN  Completed      08/07/2022   11:17 AM 08/07/2022   11:03 PM 08/08/2022    8:00 PM 08/09/2022    8:00 AM 08/17/2022   10:59 AM  Fall  Risk  Falls in the past year?     1  Was there an injury with Fall?     1  Fall Risk Category Calculator     3  Fall Risk Category (Retired)     High  (RETIRED) Patient Fall Risk Level High fall risk High fall risk High fall risk High fall risk High fall risk  Patient at Risk for Falls Due to     History  of fall(s);Impaired balance/gait;Impaired mobility  Fall risk Follow up     Falls evaluation completed   Functional Status Survey:    Vitals:   11/01/22 1128  BP: 132/77  Pulse: 65  Resp: 17  Temp: 97.8 F (36.6 C)  TempSrc: Temporal  SpO2: 92%  Weight: 164 lb 9.6 oz (74.7 kg)  Height: '5\' 1"'$  (1.549 m)   Body mass index is 31.1 kg/m. Physical Exam Vitals and nursing note reviewed.  Constitutional:      General: She is not in acute distress.    Appearance: She is not diaphoretic.  HENT:     Head: Normocephalic and atraumatic.  Neck:     Vascular: No JVD.  Cardiovascular:     Rate and Rhythm: Normal rate and regular rhythm.     Heart sounds: No murmur heard. Pulmonary:     Effort: Pulmonary effort is normal. No respiratory distress.     Breath sounds: Normal breath sounds. No wheezing.  Musculoskeletal:        General: Tenderness (left greater trochanter) present. No swelling or deformity.     Right lower leg: No edema.     Left lower leg: No edema.     Comments: Pain with ROM of the left hip. NO deformity, no swelling or bruising.  Left knee without pain with ROM. No deformity or swelling.   Skin:    General: Skin is warm and dry.  Neurological:     General: No focal deficit present.     Mental Status: She is alert.     Labs reviewed: Recent Labs    08/07/22 1122 08/07/22 1615 08/07/22 2343 08/16/22 0000 08/16/22 1300  NA 141  --  142 142 142  K 4.2  --  3.5 4.1 4.1  CL 104  --   --  104 104  CO2 29  --   --  23* 23*  GLUCOSE 99  --   --   --   --   BUN 14  --   --  10 10  CREATININE 0.95  --   --  0.7 0.7  CALCIUM 9.2  --   --  9.3 9.3  MG  --   2.2  --   --   --   PHOS  --  4.0  --   --   --    Recent Labs    08/07/22 1615 08/16/22 0000 08/16/22 1300  AST '20 16 16  '$ ALT '14 14 14  '$ ALKPHOS 69 100 100  BILITOT 0.5  --   --   PROT 6.8  --   --   ALBUMIN 3.6 4.0 4.0   Recent Labs    07/20/22 0000 08/07/22 1122 08/07/22 2343 08/18/22 0000  WBC 5.8 9.9  --  7.2  NEUTROABS  --  7.5  --   --   HGB 14.5 14.1 12.6 13.5  HCT 42 43.4 37.0 41  MCV  --  98.9  --   --   PLT 224 301  --  298   Lab Results  Component Value Date   TSH 1.147 08/07/2022   Lab Results  Component Value Date   HGBA1C 5.6 09/21/2017   Lab Results  Component Value Date   CHOL 174 03/23/2022   HDL 66 03/23/2022   LDLCALC 81 03/23/2022   TRIG 134 03/23/2022   CHOLHDL 2.1 09/21/2017    Significant Diagnostic Results in last 30 days:  No results found.  Assessment/Plan  1.  Left hip pain ? If this is an exacerbation of a chronic problem vs acute injury Will add norco Obtain xray If not improving f/u with ortho Agree with new chair for comfort per OT - HYDROcodone-acetaminophen (NORCO/VICODIN) 5-325 MG tablet; Take 0.5 tablets by mouth every 6 (six) hours as needed for up to 14 days.  Dispense: 30 tablet; Refill: 0   Family/ staff Communication: nruse  Labs/tests ordered:  hip xray left

## 2022-11-01 NOTE — Progress Notes (Signed)
Location:  [facility] Room Number: 146-A Place of Service:  SNF [phone]) Provider:  Virgie Dad, MD   Virgie Dad, MD  Patient Care Team: Virgie Dad, MD as PCP - General (Internal Medicine) Nahser, Wonda Cheng, MD as PCP - Cardiology (Cardiology) Clarene Essex, MD as Consulting Physician (Gastroenterology) Marcial Pacas, MD as Consulting Physician (Neurology) Kristeen Miss, MD as Consulting Physician (Neurosurgery) Martinique, Amy, MD as Consulting Physician (Dermatology) Megan Salon, MD as Consulting Physician (Gynecology) [place], Heather, [place] (Optometry) Virgie Dad, MD (Internal Medicine)  Extended Emergency Contact Information Primary Emergency Contact: Hancox,Robert J Address: [address]          [place], [place] [address] [name] of [place] Phone: [phone] Work Phone: [phone] Mobile Phone: [phone] Relation: Spouse Secondary Emergency Contact: Quitman Livings of [place] Phone: [phone] Mobile Phone: [phone] Relation: Son  Code Status:  DNR Goals of care: Advanced Directive information    11/01/2022    1:32 PM  Advanced Directives  Does Patient Have a Medical Advance Directive? Yes  Type of Paramedic of [place];Living will;Out of facility DNR (pink MOST or yellow form)  Does patient want to make changes to medical advance directive? No - Patient declined  Copy of [place] in Chart? Yes - validated most recent copy scanned in chart (See row information)  Pre-existing out of facility DNR order (yellow form or pink MOST form) Yellow form placed in chart (order not valid for inpatient use)     Chief Complaint  Patient presents with   Routine    HPI:  Pt is a 82 y.o. female seen today for medical management of chronic diseases.     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 Street of back injections by Dr. Brien Few 2016 with good relief, history of cervical DD with possiblity of surgery by Dr. Trenton Gammon.   GERD (gastroesophageal reflux disease)    IBS with moderate refluc seen on upper GI study form January 2011 Dr. Watt Climes   HTN (hypertension)    no meds   Hyperlipidemia    Hypothyroidism    IBS (irritable bowel syndrome)    Imbalance    Impingement syndrome of right shoulder 01/05/2013   Mass of left side of neck    Memory loss    Mood disorder (HCC)    MVP (mitral valve prolapse)    Hx of    Osteopenia    Peripheral edema    Right rotator cuff tear 01/05/2013   Sciatic pain    recurrent   Seasonal allergies    Stroke [facility])    Tension headache    TIA (transient ischemic attack)    Tremor    Past Surgical History:  Procedure Laterality Date   ANTERIOR CERVICAL DECOMP/DISCECTOMY FUSION  04/2015    Dr. Pamala Hurry, [place]  2004   which revealed smooth and normal coronary arteries   CARPAL TUNNEL RELEASE  2000   lt   CATARACT EXTRACTION W/ INTRAOCULAR LENS  IMPLANT, BILATERAL  1/17   Dr. Lucita Ferrara   COSMETIC SURGERY  2010   eyes-facial   DENTAL SURGERY  1960   LASIK     LOOP RECORDER INSERTION N/A 09/22/2017   Procedure: LOOP RECORDER INSERTION;  Surgeon: Constance Haw, MD;  Location: [facility];  Service: Cardiovascular;  Laterality: N/A;   LUMBAR LAMINECTOMY/DECOMPRESSION MICRODISCECTOMY N/A 06/08/2021   Procedure: Laminectomy and Foraminotomy - L4-L5, L5-S1;  Surgeon: Earnie Larsson, MD;  Location: [facility];  Service: Neurosurgery;  Laterality: N/A;   SHOULDER ARTHROSCOPY WITH ROTATOR CUFF REPAIR Right 01/05/2013   Procedure: SHOULDER ARTHROSCOPY WITH ARTHROSCOPIC  ROTATOR CUFF REPAIR,  ACROMIOPLASTY, EXTENSIVE DEBRIDEMENT;  Surgeon: Johnny Bridge, MD;  Location: [facility];  Service: Orthopedics;  Laterality: Right;   TEE WITHOUT CARDIOVERSION N/A 09/22/2017   Procedure: TRANSESOPHAGEAL ECHOCARDIOGRAM (TEE);  Surgeon: Sanda Klein, MD;  [facility];  Service: Cardiovascular;  Laterality: N/A;   TONSILLECTOMY     TUBAL LIGATION      Allergies  Allergen Reactions   Fluticasone Other (See Comments)    DRY EYES   Aciphex [Rabeprazole] Other (See Comments)    Critical    Codeine Nausea Only   Codeine Other (See Comments)    unknown   Cymbalta [Duloxetine Hcl] Other (See Comments)    Critical    Lexapro [Escitalopram] Other (See Comments)    critical   Lipitor [Atorvastatin Calcium] Other (See Comments)    Intolerant    Morphine Sulfate     Per matrix   Pneumovax [Pneumococcal Polysaccharide Vaccine] Other (See Comments)    moderate   Prevacid [Lansoprazole] Other (See Comments)    Unknown, listed on MAR   Rosuvastatin Other (See Comments)    Intolerant    Sertraline Other (See Comments)    critical   Statins Other (See Comments)    Critical    Sulfa Antibiotics Nausea And Vomiting   Tetracyclines & Related Other (See Comments)    Morphine Sulfate   Welchol [Colesevelam] Other (See Comments)    Unknown, listed on MAR   Pneumovax 23 [Pneumococcal Vac Polyvalent] Rash    Injection site reaction    Outpatient Encounter Medications as of 11/01/2022  Medication Sig   acetaminophen (TYLENOL) 325 MG tablet Take 650 mg by mouth 2 (two) times daily as needed.   acetaminophen (TYLENOL) 325 MG tablet Take 650 mg by mouth 3 (three) times daily.   Calcium Carb-Cholecalciferol (CALCIUM 500 + D PO) Take 1 tablet by mouth daily.   calcium carbonate (TUMS - DOSED IN MG ELEMENTAL CALCIUM) 500 MG chewable tablet Chew 2 tablets by mouth 2 (two) times daily as needed for indigestion or heartburn.   clonazePAM (KLONOPIN) 0.5 MG tablet Take 0.5  tablets (0.25 mg total) by mouth at bedtime.   clopidogrel (PLAVIX) 75 MG tablet Take 1 tablet (75 mg total) by mouth daily.   diclofenac Sodium (VOLTAREN) 1 % GEL Apply 2 g topically 2 (two) times daily as needed (pain).   donepezil (ARICEPT) 10 MG tablet Take 1 tablet (10 mg total) by mouth daily.   gabapentin (NEURONTIN) 100 MG capsule Take 200 mg by mouth 3 (three) times daily.   HYDROcodone-acetaminophen (NORCO/VICODIN) 5-325 MG tablet Take 0.5 tablets by mouth every 6 (six) hours as needed for up to 14 days.   levothyroxine (SYNTHROID) 88 MCG tablet Take 88 mcg by mouth daily before breakfast.   lidocaine 4 % Place 1 patch onto the skin in the morning and at bedtime.   melatonin 5 MG TABS Take 5 mg by mouth  at bedtime as needed (sleep).   memantine (NAMENDA) 5 MG tablet Take 5 mg by mouth 2 (two) times daily.   mirabegron ER (MYRBETRIQ) 25 MG TB24 tablet Take 1 tablet (25 mg total) by mouth daily.   Multiple Vitamins-Minerals (PRESERVISION AREDS PO) Take 1 tablet by mouth 2 (two) times daily.    omeprazole (PRILOSEC) 40 MG capsule Take 40 mg by mouth daily.   propranolol (INDERAL) 20 MG tablet Take 20 mg by mouth 2 (two) times daily. 1 tablet (20 mg) oral, Twice A Day   rosuvastatin (CRESTOR) 5 MG tablet Take 1 tablet (5 mg total) by mouth daily.   [DISCONTINUED] acetaminophen (TYLENOL) 325 MG tablet Take 650 mg by mouth See admin instructions. Take 650 mg 3 times daily, may take 650 mg 2 times daily as needed for pain   [DISCONTINUED] clonazePAM (KLONOPIN) 0.5 MG tablet Take 0.5 tablets (0.25 mg total) by mouth 2 (two) times daily as needed (for anxiety and shakiness).   No facility-administered encounter medications on file as of 11/01/2022.    Review of Systems  Immunization History  Administered Date(s) Administered   Fluad Quad(high Dose 65+) 07/02/2022   Influenza, High Dose Seasonal PF 07/23/2017, 07/03/2019   Influenza-Unspecified 05/27/2010, 08/03/2010, 07/22/2011, 07/01/2012,  06/22/2013, 07/18/2020, 07/11/2021, 07/02/2022   Janssen (J&J) SARS-COV-2 Vaccination 11/06/2019   Moderna SARS-COV2 Booster Vaccination 08/05/2021   Moderna Sars-Covid-2 Vaccination 08/07/2020   PFIZER(Purple Top)SARS-COV-2 Vaccination 10/09/2019, 08/07/2020   Pneumococcal Conjugate-13 02/08/2015   Pneumococcal Polysaccharide-23 05/28/2006, 10/13/2011   Pneumococcal-Unspecified 10/17/2013   Td 03/29/2002   Tdap 09/27/2001, 04/25/2002, 08/18/2012, 10/17/2013   Zoster Recombinat (Shingrix) 05/23/2021, 07/23/2021   Zoster, Live 08/30/2006   Zoster, Unspecified 08/30/2006   Pertinent  Health Maintenance Due  Topic Date Due   INFLUENZA VACCINE  Completed   DEXA SCAN  Completed      08/07/2022   11:17 AM 08/07/2022   11:03 PM 08/08/2022    8:00 PM 08/09/2022    8:00 AM 08/17/2022   10:59 AM  Fall Risk  Falls in the past year?     1  Was there an injury with Fall?     1  Fall Risk Category Calculator     3  Fall Risk Category (Retired)     High  (RETIRED) Patient Fall Risk Level High fall risk High fall risk High fall risk High fall risk High fall risk  Patient at Risk for Falls Due to     History of fall(s);Impaired balance/gait;Impaired mobility  Fall risk Follow up     Falls evaluation completed   Functional Status Survey:    Vitals:   11/01/22 1329  BP: 132/77  Pulse: 65  Resp: 17  Temp: (!) 97.3 F (36.3 C)  SpO2: 92%  Weight: 164 lb 9.6 oz (74.7 kg)  Height: '5\' 1"'$  (1.549 m)   Body mass index is 31.1 kg/m. Physical Exam  Labs reviewed: Recent Labs    08/07/22 1122 08/07/22 1615 08/07/22 2343 08/16/22 0000 08/16/22 1300  NA 141  --  142 142 142  K 4.2  --  3.5 4.1 4.1  CL 104  --   --  104 104  CO2 29  --   --  23* 23*  GLUCOSE 99  --   --   --   --   BUN 14  --   --  10 10  CREATININE 0.95  --   --  0.7 0.7  CALCIUM 9.2  --   --  9.3 9.3  MG  --  2.2  --   --   --   PHOS  --  4.0  --   --   --    Recent Labs    08/07/22 1615 08/16/22 0000  08/16/22 1300  AST '20 16 16  '$ ALT '14 14 14  '$ ALKPHOS 69 100 100  BILITOT 0.5  --   --   PROT 6.8  --   --   ALBUMIN 3.6 4.0 4.0   Recent Labs    07/20/22 0000 08/07/22 1122 08/07/22 2343 08/18/22 0000  WBC 5.8 9.9  --  7.2  NEUTROABS  --  7.5  --   --   HGB 14.5 14.1 12.6 13.5  HCT 42 43.4 37.0 41  MCV  --  98.9  --   --   PLT 224 301  --  298   Lab Results  Component Value Date   TSH 1.147 08/07/2022   Lab Results  Component Value Date   HGBA1C 5.6 09/21/2017   Lab Results  Component Value Date   CHOL 174 03/23/2022   HDL 66 03/23/2022   LDLCALC 81 03/23/2022   TRIG 134 03/23/2022   CHOLHDL 2.1 09/21/2017    Significant Diagnostic Results in last 30 days:  No results found.  Assessment/Plan There are no diagnoses linked to this encounter.   Family/ staff Communication:   Labs/tests ordered:

## 2022-11-02 DIAGNOSIS — M25552 Pain in left hip: Secondary | ICD-10-CM | POA: Diagnosis not present

## 2022-11-03 DIAGNOSIS — R278 Other lack of coordination: Secondary | ICD-10-CM | POA: Diagnosis not present

## 2022-11-03 DIAGNOSIS — R293 Abnormal posture: Secondary | ICD-10-CM | POA: Diagnosis not present

## 2022-11-05 DIAGNOSIS — R293 Abnormal posture: Secondary | ICD-10-CM | POA: Diagnosis not present

## 2022-11-05 DIAGNOSIS — R278 Other lack of coordination: Secondary | ICD-10-CM | POA: Diagnosis not present

## 2022-11-07 DIAGNOSIS — R278 Other lack of coordination: Secondary | ICD-10-CM | POA: Diagnosis not present

## 2022-11-07 DIAGNOSIS — R293 Abnormal posture: Secondary | ICD-10-CM | POA: Diagnosis not present

## 2022-11-09 DIAGNOSIS — R293 Abnormal posture: Secondary | ICD-10-CM | POA: Diagnosis not present

## 2022-11-09 DIAGNOSIS — R278 Other lack of coordination: Secondary | ICD-10-CM | POA: Diagnosis not present

## 2022-11-12 ENCOUNTER — Encounter: Payer: Self-pay | Admitting: Adult Health

## 2022-11-12 ENCOUNTER — Non-Acute Institutional Stay (SKILLED_NURSING_FACILITY): Payer: Medicare Other | Admitting: Adult Health

## 2022-11-12 DIAGNOSIS — M25552 Pain in left hip: Secondary | ICD-10-CM | POA: Diagnosis not present

## 2022-11-12 MED ORDER — HYDROCODONE-ACETAMINOPHEN 5-325 MG PO TABS: 1.0000 | ORAL_TABLET | Freq: Four times a day (QID) | ORAL | 0 refills | Status: AC | PRN

## 2022-11-12 NOTE — Progress Notes (Addendum)
Location:  [facility] Room Number: 146A Place of Service:  SNF [id number]) Provider:  Earney Hamburg, MD  Patient Care Team: Virgie Dad, MD as PCP - General (Internal Medicine) Nahser, Wonda Cheng, MD as PCP - Cardiology (Cardiology) Clarene Essex, MD as Consulting Physician (Gastroenterology) Marcial Pacas, MD as Consulting Physician (Neurology) Kristeen Miss, MD as Consulting Physician (Neurosurgery) Martinique, Amy, MD as Consulting Physician (Dermatology) Megan Salon, MD as Consulting Physician (Gynecology) [place], Heather, [place] (Optometry) Virgie Dad, MD (Internal Medicine)  Extended Emergency Contact Information Primary Emergency Contact: Debellis,Robert J Address: [address]          [place],  [address] [name] Phone: [phone] Work Phone: [phone] Mobile Phone: [phone] Relation: Spouse Secondary Emergency Contact: Q[place]man Livings of [place] Phone: [phone] Mobile Phone: [phone] Relation: Son  Code Status:  DNR Goals of care: Advanced Directive information    11/01/2022    1:32 PM  Advanced Directives  Does Patient Have a Medical Advance Directive? Yes  Type of Paramedic of [place];Living will;Out of facility DNR (pink MOST or yellow form)  Does patient want to make changes to medical advance directive? No - Patient declined  Copy of Ila in Chart? Yes - validated most recent copy scanned in chart (See row information)  Pre-existing out of facility DNR order (yellow form or pink MOST form) Yellow form placed in chart (order not valid for inpatient use)     Chief Complaint  Patient presents with   Acute Visit    Patient is experiencing some left hip pain    HPI:  Pt is a 82 y.o. female seen today for an acute visit for left hip pain.   Slid out of her chair on 1/26. Unwitnessed fall and pt can  not contribute to hx.  Her pain is worse with movement since the fall. Xray of left hip ordered 11/01/22 which did not show a fracture, did show OA.   Pt continues to have pain, worse at night. No swelling or bruising. No deformity. Able to bear weight. Norco 1/2 tab not helping. Pain when lifting leg off the bed. Hx of chronic pain, injections needed due to spinal stenosis.   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 Street of back injections by Dr. Brien Few 2016 with good relief, history of cervical DD with possiblity of surgery by Dr. Trenton Gammon.   GERD (gastroesophageal reflux disease)    IBS with moderate refluc seen on upper GI study form January 2011 Dr. Watt Climes   HTN (hypertension)    no meds   Hyperlipidemia    Hypothyroidism    IBS (irritable bowel syndrome)    Imbalance    Impingement syndrome of right shoulder 01/05/2013   Mass of left side of neck    Memory loss    Mood disorder (HCC)    MVP (mitral valve prolapse)    Hx of    Osteopenia    Peripheral edema    Right rotator cuff tear 01/05/2013   Sciatic pain    recurrent  Seasonal allergies    Stroke [facility])    Tension headache    TIA (transient ischemic attack)    Tremor    Past Surgical History:  Procedure Laterality Date   ANTERIOR CERVICAL DECOMP/DISCECTOMY FUSION  04/2015    Dr. Pamala Hurry, [place]  2004   which revealed smooth and normal coronary arteries   CARPAL TUNNEL RELEASE  2000   lt   CATARACT EXTRACTION W/ INTRAOCULAR LENS  IMPLANT, BILATERAL  1/17   Dr. Lucita Ferrara   COSMETIC SURGERY  2010   eyes-facial   DENTAL SURGERY  1960   LASIK     LOOP RECORDER INSERTION N/A 09/22/2017   Procedure: LOOP RECORDER  INSERTION;  Surgeon: Constance Haw, MD;  Location: [facility] CV LAB;  Service: Cardiovascular;  Laterality: N/A;   LUMBAR LAMINECTOMY/DECOMPRESSION MICRODISCECTOMY N/A 06/08/2021   Procedure: Laminectomy and Foraminotomy - L4-L5, L5-S1;  Surgeon: Earnie Larsson, MD;  Location: [place];  Service: Neurosurgery;  Laterality: N/A;   SHOULDER ARTHROSCOPY WITH ROTATOR CUFF REPAIR Right 01/05/2013   Procedure: SHOULDER ARTHROSCOPY WITH ARTHROSCOPIC  ROTATOR CUFF REPAIR, ACROMIOPLASTY, EXTENSIVE DEBRIDEMENT;  Surgeon: Johnny Bridge, MD;  Location: [facility];  Service: Orthopedics;  Laterality: Right;   TEE WITHOUT CARDIOVERSION N/A 09/22/2017   Procedure: TRANSESOPHAGEAL ECHOCARDIOGRAM (TEE);  Surgeon: Sanda Klein, MD;  [facility];  Service: Cardiovascular;  Laterality: N/A;   TONSILLECTOMY     TUBAL LIGATION      Allergies  Allergen Reactions   Fluticasone Other (See Comments)    DRY EYES   Aciphex [Rabeprazole] Other (See Comments)    Critical    Codeine Nausea Only   Codeine Other (See Comments)    unknown   Cymbalta [Duloxetine Hcl] Other (See Comments)    Critical    Lexapro [Escitalopram] Other (See Comments)    critical   Lipitor [Atorvastatin Calcium] Other (See Comments)    Intolerant    Morphine Sulfate     Per matrix   Pneumovax [Pneumococcal Polysaccharide Vaccine] Other (See Comments)    moderate   Prevacid [Lansoprazole] Other (See Comments)    Unknown, listed on MAR   Rosuvastatin Other (See Comments)    Intolerant    Sertraline Other (See Comments)    critical   Statins Other (See Comments)    Critical    Sulfa Antibiotics Nausea And Vomiting   Tetracyclines & Related Other (See Comments)    Morphine Sulfate   Welchol [Colesevelam] Other (See Comments)    Unknown, listed on MAR   Pneumovax 23 [Pneumococcal Vac Polyvalent] Rash    Injection site reaction    Outpatient Encounter Medications as of 11/12/2022  Medication Sig    acetaminophen (TYLENOL) 325 MG tablet Take 650 mg by mouth 2 (two) times daily as needed.   acetaminophen (TYLENOL) 325 MG tablet Take 650 mg by mouth 3 (three) times daily.   Calcium Carb-Cholecalciferol (CALCIUM 500 + D PO) Take 1 tablet by mouth daily.   calcium carbonate (TUMS - DOSED IN MG ELEMENTAL CALCIUM) 500 MG chewable tablet Chew 2 tablets by mouth 2 (two) times daily as needed for indigestion or heartburn.   clonazePAM (KLONOPIN) 0.5 MG tablet Take 0.5 tablets (0.25 mg total) by mouth at bedtime.   clopidogrel (PLAVIX) 75 MG tablet Take 1 tablet (75 mg total) by mouth daily.   diclofenac Sodium (VOLTAREN) 1 % GEL Apply 2 g topically 2 (two) times daily as needed (pain).   donepezil (ARICEPT) 10  MG tablet Take 1 tablet (10 mg total) by mouth daily.   gabapentin (NEURONTIN) 100 MG capsule Take 200 mg by mouth 3 (three) times daily.   HYDROcodone-acetaminophen (NORCO/VICODIN) 5-325 MG tablet Take 0.5 tablets by mouth every 6 (six) hours as needed for up to 14 days.   levothyroxine (SYNTHROID) 88 MCG tablet Take 88 mcg by mouth daily before breakfast.   lidocaine 4 % Place 1 patch onto the skin in the morning and at bedtime.   melatonin 5 MG TABS Take 5 mg by mouth at bedtime as needed (sleep).   memantine (NAMENDA) 5 MG tablet Take 5 mg by mouth 2 (two) times daily.   mirabegron ER (MYRBETRIQ) 25 MG TB24 tablet Take 1 tablet (25 mg total) by mouth daily.   Multiple Vitamins-Minerals (PRESERVISION AREDS PO) Take 1 tablet by mouth 2 (two) times daily.    omeprazole (PRILOSEC) 40 MG capsule Take 40 mg by mouth daily.   propranolol (INDERAL) 20 MG tablet Take 20 mg by mouth 2 (two) times daily. 1 tablet (20 mg) oral, Twice A Day   rosuvastatin (CRESTOR) 5 MG tablet Take 1 tablet (5 mg total) by mouth daily.   No facility-administered encounter medications on file as of 11/12/2022.    Review of Systems  Constitutional:  Positive for activity change. Negative for appetite change, chills,  diaphoresis, fatigue, fever and unexpected weight change.  Cardiovascular:  Negative for leg swelling.  Musculoskeletal:  Positive for arthralgias, back pain and gait problem. Negative for joint swelling and myalgias.    Immunization History  Administered Date(s) Administered   Fluad Quad(high Dose 65+) 07/02/2022   Influenza, High Dose Seasonal PF 07/23/2017, 07/03/2019   Influenza-Unspecified 05/27/2010, 08/03/2010, 07/22/2011, 07/01/2012, 06/22/2013, 07/18/2020, 07/11/2021, 07/02/2022   Janssen (J&J) SARS-COV-2 Vaccination 11/06/2019   Moderna SARS-COV2 Booster Vaccination 08/05/2021   Moderna Sars-Covid-2 Vaccination 08/07/2020   PFIZER(Purple Top)SARS-COV-2 Vaccination 10/09/2019, 08/07/2020   Pneumococcal Conjugate-13 02/08/2015   Pneumococcal Polysaccharide-23 05/28/2006, 10/13/2011   Pneumococcal-Unspecified 10/17/2013   Td 03/29/2002   Tdap 09/27/2001, 04/25/2002, 08/18/2012, 10/17/2013   Zoster Recombinat (Shingrix) 05/23/2021, 07/23/2021   Zoster, Live 08/30/2006   Zoster, Unspecified 08/30/2006   Pertinent  Health Maintenance Due  Topic Date Due   INFLUENZA VACCINE  Completed   DEXA SCAN  Completed      08/07/2022   11:17 AM 08/07/2022   11:03 PM 08/08/2022    8:00 PM 08/09/2022    8:00 AM 08/17/2022   10:59 AM  Fall Risk  Falls in the past year?     1  Was there an injury with Fall?     1  Fall Risk Category Calculator     3  Fall Risk Category (Retired)     High  (RETIRED) Patient Fall Risk Level High fall risk High fall risk High fall risk High fall risk High fall risk  Patient at Risk for Falls Due to     History of fall(s);Impaired balance/gait;Impaired mobility  Fall risk Follow up     Falls evaluation completed   Functional Status Survey:    Vitals:   11/12/22 1001  BP: (!) 143/82  Pulse: (!) 56  Resp: 19  Temp: 97.7 F (36.5 C)  TempSrc: Temporal  SpO2: 93%  Weight: 164 lb 9.6 oz (74.7 kg)  Height: 5' 1"$  (1.549 m)   Body mass index is  31.1 kg/m. Physical Exam Vitals and nursing note reviewed.  Constitutional:      General: She is not in acute distress.  Appearance: She is not diaphoretic.  HENT:     Head: Normocephalic and atraumatic.  Neck:     Vascular: No JVD.  Cardiovascular:     Rate and Rhythm: Normal rate and regular rhythm.     Heart sounds: No murmur heard. Pulmonary:     Effort: Pulmonary effort is normal. No respiratory distress.     Breath sounds: Normal breath sounds. No wheezing.  Musculoskeletal:        General: No swelling, tenderness or deformity.     Right lower leg: No edema.     Left lower leg: No edema.     Comments: Pain when raising left leg off the recliner. No tenderness on palpation. No bruising. No redness or warmth or swelling. Pain with ROM of the left hip.  Skin:    General: Skin is warm and dry.  Neurological:     General: No focal deficit present.     Mental Status: She is alert. Mental status is at baseline.     Labs reviewed: Recent Labs    08/07/22 1122 08/07/22 1615 08/07/22 2343 08/16/22 0000 08/16/22 1300  NA 141  --  142 142 142  K 4.2  --  3.5 4.1 4.1  CL 104  --   --  104 104  CO2 29  --   --  23* 23*  GLUCOSE 99  --   --   --   --   BUN 14  --   --  10 10  CREATININE 0.95  --   --  0.7 0.7  CALCIUM 9.2  --   --  9.3 9.3  MG  --  2.2  --   --   --   PHOS  --  4.0  --   --   --    Recent Labs    08/07/22 1615 08/16/22 0000 08/16/22 1300  AST 20 16 16  $ ALT 14 14 14  $ ALKPHOS 69 100 100  BILITOT 0.5  --   --   PROT 6.8  --   --   ALBUMIN 3.6 4.0 4.0   Recent Labs    07/20/22 0000 08/07/22 1122 08/07/22 2343 08/18/22 0000  WBC 5.8 9.9  --  7.2  NEUTROABS  --  7.5  --   --   HGB 14.5 14.1 12.6 13.5  HCT 42 43.4 37.0 41  MCV  --  98.9  --   --   PLT 224 301  --  298   Lab Results  Component Value Date   TSH 1.147 08/07/2022   Lab Results  Component Value Date   HGBA1C 5.6 09/21/2017   Lab Results  Component Value Date   CHOL 174  03/23/2022   HDL 66 03/23/2022   LDLCALC 81 03/23/2022   TRIG 134 03/23/2022   CHOLHDL 2.1 09/21/2017    Significant Diagnostic Results in last 30 days:  No results found.  Assessment/Plan  1. Left hip pain  Pain in the groin area when lifting leg.  Could not get into see ortho til 2/27.  Will re xray left hip but include pelvis this time No sign of warmth or swelling in the left Hx of injections to right hip If no improvement will order CT unless she can get in with ortho sooner.  - HYDROcodone-acetaminophen (NORCO/VICODIN) 5-325 MG tablet; Take 1 tablet by mouth every 6 (six) hours as needed for up to 14 days.  Dispense: 30 tablet; Refill: 0  Will order  doppler venous left.   Family/ staff Communication: nurse  Labs/tests ordered:  xray 2 view left hip and pelvis.

## 2022-11-13 DIAGNOSIS — M25552 Pain in left hip: Secondary | ICD-10-CM | POA: Diagnosis not present

## 2022-11-15 DIAGNOSIS — M25552 Pain in left hip: Secondary | ICD-10-CM | POA: Diagnosis not present

## 2022-11-16 DIAGNOSIS — R278 Other lack of coordination: Secondary | ICD-10-CM | POA: Diagnosis not present

## 2022-11-16 DIAGNOSIS — R293 Abnormal posture: Secondary | ICD-10-CM | POA: Diagnosis not present

## 2022-11-17 DIAGNOSIS — R293 Abnormal posture: Secondary | ICD-10-CM | POA: Diagnosis not present

## 2022-11-17 DIAGNOSIS — R52 Pain, unspecified: Secondary | ICD-10-CM | POA: Diagnosis not present

## 2022-11-17 DIAGNOSIS — R278 Other lack of coordination: Secondary | ICD-10-CM | POA: Diagnosis not present

## 2022-11-19 DIAGNOSIS — R278 Other lack of coordination: Secondary | ICD-10-CM | POA: Diagnosis not present

## 2022-11-19 DIAGNOSIS — R293 Abnormal posture: Secondary | ICD-10-CM | POA: Diagnosis not present

## 2022-11-22 ENCOUNTER — Encounter: Payer: Self-pay | Admitting: Internal Medicine

## 2022-11-22 ENCOUNTER — Non-Acute Institutional Stay (SKILLED_NURSING_FACILITY): Payer: Medicare Other | Admitting: Internal Medicine

## 2022-11-22 DIAGNOSIS — M48062 Spinal stenosis, lumbar region with neurogenic claudication: Secondary | ICD-10-CM | POA: Diagnosis not present

## 2022-11-22 DIAGNOSIS — M25552 Pain in left hip: Secondary | ICD-10-CM

## 2022-11-22 DIAGNOSIS — I679 Cerebrovascular disease, unspecified: Secondary | ICD-10-CM

## 2022-11-22 DIAGNOSIS — F419 Anxiety disorder, unspecified: Secondary | ICD-10-CM

## 2022-11-22 DIAGNOSIS — E039 Hypothyroidism, unspecified: Secondary | ICD-10-CM | POA: Diagnosis not present

## 2022-11-22 DIAGNOSIS — F0150 Vascular dementia without behavioral disturbance: Secondary | ICD-10-CM

## 2022-11-22 DIAGNOSIS — E782 Mixed hyperlipidemia: Secondary | ICD-10-CM

## 2022-11-22 DIAGNOSIS — R293 Abnormal posture: Secondary | ICD-10-CM | POA: Diagnosis not present

## 2022-11-22 DIAGNOSIS — R278 Other lack of coordination: Secondary | ICD-10-CM | POA: Diagnosis not present

## 2022-11-22 NOTE — Progress Notes (Signed)
Location:  Medical illustrator of Service:  SNF (31)  Provider:   Code Status: DNR Goals of Care:     11/12/2022   10:54 AM  Advanced Directives  Does Patient Have a Medical Advance Directive? Yes  Type of Estate agent of [place];Living will;Out of facility DNR (pink MOST or yellow form)  Does patient want to make changes to medical advance directive? No - Patient declined  Copy of Healthcare Power of Attorney in Chart? Yes - validated most recent copy scanned in chart (See row information)  Pre-existing out of facility DNR order (yellow form or pink MOST form) Yellow form placed in chart (order not valid for inpatient use)     Chief Complaint  Patient presents with   Chronic Care Management    HPI: Patient is a 82 y.o. female seen today for medical management of chronic diseases.    Lives in SNF in [place]  Patient has h/o  Vascular dementia Worsening Cognition S/p CVA in 2018  Essential Tremor, Anxiety, Brachial Plexux Palsy, Chronic Pain in her Left Knee, Hypothyroidism, Hyperlipidemia,  S/P L4-5, L5-S1 decompressive laminectomy with foraminotomies due to Severe Back pain Done on 06/08/21  Per nurses patient is stable She is stand and Pivot  She has had some Left Hip pain recently Xray shows Moderate Arthritis Not Walking much now Stays in her Wheelchair Also Has Chronic Pain in her Back on Neurontin and Hydrocodone She has gained weight  Did not have any acute complains to me No Behaviors  Past Medical History:  Diagnosis Date   Anxiety    Arthritis    Biceps tendonitis 01/05/2013   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 Street of back injections by Dr. Murray Hodgkins 2016 with good relief, history of cervical DD with possiblity of surgery by  Dr. Dutch Quint.   GERD (gastroesophageal reflux disease)    IBS with moderate refluc seen on upper GI study form January 2011 Dr. Ewing Schlein   HTN (hypertension)    no meds   Hyperlipidemia    Hypothyroidism    IBS (irritable bowel syndrome)    Imbalance    Impingement syndrome of right shoulder 01/05/2013   Mass of left side of neck    Memory loss    Mood disorder (HCC)    MVP (mitral valve prolapse)    Hx of    Osteopenia    Peripheral edema    Right rotator cuff tear 01/05/2013   Sciatic pain    recurrent   Seasonal allergies    Stroke [facility])    Tension headache    TIA (transient ischemic attack)    Tremor     Past Surgical History:  Procedure Laterality Date   ANTERIOR CERVICAL DECOMP/DISCECTOMY FUSION  04/2015    Dr. Rise Mu, [facility]   CARDIAC CATHETERIZATION  2004   which revealed smooth and normal coronary arteries   CARPAL TUNNEL RELEASE  2000   lt   CATARACT EXTRACTION W/ INTRAOCULAR LENS  IMPLANT, BILATERAL  1/17   Dr. Delaney Meigs   COSMETIC SURGERY  2010   eyes-facial   DENTAL SURGERY  1960   LASIK     LOOP RECORDER INSERTION N/A 09/22/2017   Procedure: LOOP RECORDER INSERTION;  Surgeon: Regan Lemming, MD;  [facility];  Service: Cardiovascular;  Laterality: N/A;   LUMBAR LAMINECTOMY/DECOMPRESSION MICRODISCECTOMY N/A 06/08/2021   Procedure: Laminectomy and Foraminotomy - L4-L5, L5-S1;  Surgeon: Julio Sicks, MD;  [facility];  Service: Neurosurgery;  Laterality: N/A;   SHOULDER ARTHROSCOPY WITH ROTATOR CUFF REPAIR Right 01/05/2013   Procedure: SHOULDER ARTHROSCOPY WITH ARTHROSCOPIC  ROTATOR CUFF REPAIR, ACROMIOPLASTY, EXTENSIVE DEBRIDEMENT;  Surgeon: Eulas Post, MD;  Location: [facility];  Service: Orthopedics;  Laterality: Right;   TEE WITHOUT CARDIOVERSION N/A 09/22/2017   Procedure: TRANSESOPHAGEAL ECHOCARDIOGRAM (TEE);  Surgeon: Thurmon Fair, MD;  [facility];  Service: Cardiovascular;  Laterality: N/A;    TONSILLECTOMY     TUBAL LIGATION      Allergies  Allergen Reactions   Fluticasone Other (See Comments)    DRY EYES   Aciphex [Rabeprazole] Other (See Comments)    Critical    Codeine Nausea Only   Codeine Other (See Comments)    unknown   Cymbalta [Duloxetine Hcl] Other (See Comments)    Critical    Lexapro [Escitalopram] Other (See Comments)    critical   Lipitor [Atorvastatin Calcium] Other (See Comments)    Intolerant    Morphine Sulfate     Per matrix   Pneumovax [Pneumococcal Polysaccharide Vaccine] Other (See Comments)    moderate   Prevacid [Lansoprazole] Other (See Comments)    Unknown, listed on MAR   Rosuvastatin Other (See Comments)    Intolerant    Sertraline Other (See Comments)    critical   Statins Other (See Comments)    Critical    Sulfa Antibiotics Nausea And Vomiting   Tetracyclines & Related Other (See Comments)    Morphine Sulfate   Welchol [Colesevelam] Other (See Comments)    Unknown, listed on MAR   Pneumovax 23 [Pneumococcal Vac Polyvalent] Rash    Injection site reaction    Outpatient Encounter Medications as of 11/22/2022  Medication Sig   acetaminophen (TYLENOL) 325 MG tablet Take 650 mg by mouth 2 (two) times daily as needed.   acetaminophen (TYLENOL) 325 MG tablet Take 650 mg by mouth 3 (three) times daily.   Calcium Carb-Cholecalciferol (CALCIUM 500 + D PO) Take 1 tablet by mouth daily.   calcium carbonate (TUMS - DOSED IN MG ELEMENTAL CALCIUM) 500 MG chewable tablet Chew 2 tablets by mouth 2 (two) times daily as needed for indigestion or heartburn.   clonazePAM (KLONOPIN) 0.5 MG tablet Take 0.5 tablets (0.25 mg total) by mouth at bedtime.   clopidogrel (PLAVIX) 75 MG tablet Take 1 tablet (75 mg total) by mouth daily.   diclofenac Sodium (VOLTAREN) 1 % GEL Apply 2 g topically 2 (two) times daily as needed (pain).   donepezil (ARICEPT) 10 MG tablet Take 1 tablet (10 mg total) by mouth daily.   gabapentin (NEURONTIN) 100 MG capsule Take  200 mg by mouth 3 (three) times daily.   HYDROcodone-acetaminophen (NORCO/VICODIN) 5-325 MG tablet Take 1 tablet by mouth every 6 (six) hours as needed for up to 14 days.   levothyroxine (SYNTHROID) 88 MCG tablet Take 88 mcg by mouth daily before breakfast.   lidocaine 4 % Place 1 patch onto the skin in the morning and at bedtime.   melatonin 5 MG TABS Take 5 mg by mouth at bedtime as needed (sleep).   memantine (NAMENDA) 5 MG tablet Take 5 mg by mouth 2 (two) times daily.   mirabegron ER (MYRBETRIQ) 25 MG TB24 tablet Take 1 tablet (25 mg total)  by mouth daily.   Multiple Vitamins-Minerals (PRESERVISION AREDS PO) Take 1 tablet by mouth 2 (two) times daily.    omeprazole (PRILOSEC) 40 MG capsule Take 40 mg by mouth daily.   propranolol (INDERAL) 20 MG tablet Take 20 mg by mouth 2 (two) times daily. 1 tablet (20 mg) oral, Twice A Day   rosuvastatin (CRESTOR) 5 MG tablet Take 1 tablet (5 mg total) by mouth daily.   No facility-administered encounter medications on file as of 11/22/2022.    Review of Systems:  Review of Systems  Unable to perform ROS: Dementia    Health Maintenance  Topic Date Due   COVID-19 Vaccine (4 - 2023-24 season) 05/28/2022   Medicare Annual Wellness (AWV)  02/05/2023   DTaP/Tdap/Td (6 - Td or Tdap) 10/18/2023   Pneumonia Vaccine 4+ Years old  Completed   INFLUENZA VACCINE  Completed   DEXA SCAN  Completed   Zoster Vaccines- Shingrix  Completed   HPV VACCINES  Aged Out    Physical Exam: Vitals:   11/22/22 1621  BP: (!) 143/81  Pulse: (!) 58  Resp: 16  Temp: (!) 97.5 F (36.4 C)  SpO2: 94%  Weight: 164 lb 9.6 oz (74.7 kg)   Body mass index is 31.1 kg/m. Physical Exam Vitals reviewed.  Constitutional:      Appearance: Normal appearance.  HENT:     Head: Normocephalic.     Nose: Nose normal.     Mouth/Throat:     Mouth: Mucous membranes are moist.     Pharynx: Oropharynx is clear.  Eyes:     Pupils: Pupils are equal, round, and reactive to  light.  Cardiovascular:     Rate and Rhythm: Normal rate and regular rhythm.     Pulses: Normal pulses.     Heart sounds: Normal heart sounds. No murmur heard. Pulmonary:     Effort: Pulmonary effort is normal.     Breath sounds: Normal breath sounds.  Abdominal:     General: Abdomen is flat. Bowel sounds are normal.     Palpations: Abdomen is soft.  Musculoskeletal:        General: No swelling.     Cervical back: Neck supple.  Skin:    General: Skin is warm.  Neurological:     General: No focal deficit present.     Mental Status: She is alert.  Psychiatric:        Mood and Affect: Mood normal.        Thought Content: Thought content normal.     Labs reviewed: Basic Metabolic Panel: Recent Labs    03/23/22 0000 07/20/22 0000 08/07/22 1122 08/07/22 1615 08/07/22 2343 08/16/22 0000 08/16/22 1300  NA 142   < > 141  --  142 142 142  K 4.3   < > 4.2  --  3.5 4.1 4.1  CL 105   < > 104  --   --  104 104  CO2 24*   < > 29  --   --  23* 23*  GLUCOSE  --   --  99  --   --   --   --   BUN 12   < > 14  --   --  10 10  CREATININE 0.7   < > 0.95  --   --  0.7 0.7  CALCIUM 9.4   < > 9.2  --   --  9.3 9.3  MG  --   --   --  2.2  --   --   --   PHOS  --   --   --  4.0  --   --   --   TSH 1.55  --   --  1.147  --   --   --    < > = values in this interval not displayed.   Liver Function Tests: Recent Labs    08/07/22 1615 08/16/22 0000 08/16/22 1300  AST 20 16 16   ALT 14 14 14   ALKPHOS 69 100 100  BILITOT 0.5  --   --   PROT 6.8  --   --   ALBUMIN 3.6 4.0 4.0   No results for input(s): "LIPASE", "AMYLASE" in the last 8760 hours. No results for input(s): "AMMONIA" in the last 8760 hours. CBC: Recent Labs    07/20/22 0000 08/07/22 1122 08/07/22 2343 08/18/22 0000  WBC 5.8 9.9  --  7.2  NEUTROABS  --  7.5  --   --   HGB 14.5 14.1 12.6 13.5  HCT 42 43.4 37.0 41  MCV  --  98.9  --   --   PLT 224 301  --  298   Lipid Panel: Recent Labs    03/23/22 0000  CHOL  174  HDL 66  LDLCALC 81  TRIG 134   Lab Results  Component Value Date   HGBA1C 5.6 09/21/2017    Procedures since last visit: No results found.  Assessment/Plan 1. Left hip pain Xray negative Shows Arthritis On Norco and Tylenol  2. Lumbar stenosis with neurogenic claudication Contineu Gabapentin and Tylenol  3. Mixed hyperlipidemia On statin LDL 81 in 06/23  4. Anxiety Klonopin PRN  5. Cerebrovascular disease On Plavix and Statin  6. Vascular dementia without behavioral disturbance (HCC) Continue to be on Aricept and Namenda Now in SNF level of care  7. Acquired hypothyroidism TSH normal in 11/23 8 Mixed stress and urge urinary incontinence Myrbetriq  9 Tremor Back on Propanolol lower dose   Labs/tests ordered:  * No order type specified * Next appt:  Visit date not found

## 2022-11-24 DIAGNOSIS — R278 Other lack of coordination: Secondary | ICD-10-CM | POA: Diagnosis not present

## 2022-11-24 DIAGNOSIS — R293 Abnormal posture: Secondary | ICD-10-CM | POA: Diagnosis not present

## 2022-11-26 DIAGNOSIS — R278 Other lack of coordination: Secondary | ICD-10-CM | POA: Diagnosis not present

## 2022-11-26 DIAGNOSIS — M25552 Pain in left hip: Secondary | ICD-10-CM | POA: Diagnosis not present

## 2022-11-26 DIAGNOSIS — R293 Abnormal posture: Secondary | ICD-10-CM | POA: Diagnosis not present

## 2022-12-03 DIAGNOSIS — R278 Other lack of coordination: Secondary | ICD-10-CM | POA: Diagnosis not present

## 2022-12-03 DIAGNOSIS — R293 Abnormal posture: Secondary | ICD-10-CM | POA: Diagnosis not present

## 2022-12-03 DIAGNOSIS — M25552 Pain in left hip: Secondary | ICD-10-CM | POA: Diagnosis not present

## 2022-12-06 DIAGNOSIS — M25552 Pain in left hip: Secondary | ICD-10-CM | POA: Diagnosis not present

## 2022-12-06 DIAGNOSIS — R278 Other lack of coordination: Secondary | ICD-10-CM | POA: Diagnosis not present

## 2022-12-06 DIAGNOSIS — R293 Abnormal posture: Secondary | ICD-10-CM | POA: Diagnosis not present

## 2022-12-08 DIAGNOSIS — M25552 Pain in left hip: Secondary | ICD-10-CM | POA: Diagnosis not present

## 2022-12-08 DIAGNOSIS — R278 Other lack of coordination: Secondary | ICD-10-CM | POA: Diagnosis not present

## 2022-12-08 DIAGNOSIS — R293 Abnormal posture: Secondary | ICD-10-CM | POA: Diagnosis not present

## 2022-12-13 DIAGNOSIS — R278 Other lack of coordination: Secondary | ICD-10-CM | POA: Diagnosis not present

## 2022-12-13 DIAGNOSIS — M25552 Pain in left hip: Secondary | ICD-10-CM | POA: Diagnosis not present

## 2022-12-13 DIAGNOSIS — R293 Abnormal posture: Secondary | ICD-10-CM | POA: Diagnosis not present

## 2022-12-15 DIAGNOSIS — R293 Abnormal posture: Secondary | ICD-10-CM | POA: Diagnosis not present

## 2022-12-15 DIAGNOSIS — M25552 Pain in left hip: Secondary | ICD-10-CM | POA: Diagnosis not present

## 2022-12-15 DIAGNOSIS — R278 Other lack of coordination: Secondary | ICD-10-CM | POA: Diagnosis not present

## 2022-12-16 DIAGNOSIS — R293 Abnormal posture: Secondary | ICD-10-CM | POA: Diagnosis not present

## 2022-12-16 DIAGNOSIS — R278 Other lack of coordination: Secondary | ICD-10-CM | POA: Diagnosis not present

## 2022-12-16 DIAGNOSIS — M25552 Pain in left hip: Secondary | ICD-10-CM | POA: Diagnosis not present

## 2022-12-17 ENCOUNTER — Other Ambulatory Visit: Payer: Self-pay | Admitting: Adult Health

## 2022-12-17 MED ORDER — HYDROCODONE-ACETAMINOPHEN 5-325 MG PO TABS: 1.0000 | ORAL_TABLET | ORAL | 0 refills | Status: DC | PRN

## 2022-12-21 DIAGNOSIS — M25552 Pain in left hip: Secondary | ICD-10-CM | POA: Diagnosis not present

## 2022-12-21 DIAGNOSIS — R278 Other lack of coordination: Secondary | ICD-10-CM | POA: Diagnosis not present

## 2022-12-21 DIAGNOSIS — R293 Abnormal posture: Secondary | ICD-10-CM | POA: Diagnosis not present

## 2022-12-22 ENCOUNTER — Emergency Department (HOSPITAL_COMMUNITY)
Admission: EM | Admit: 2022-12-22 | Discharge: 2022-12-22 | Disposition: A | Discharge status: Skilled Nursing Facility | Payer: Medicare Other | Attending: Emergency Medicine | Admitting: Emergency Medicine

## 2022-12-22 ENCOUNTER — Emergency Department (HOSPITAL_COMMUNITY): Payer: Medicare Other

## 2022-12-22 ENCOUNTER — Other Ambulatory Visit: Payer: Self-pay

## 2022-12-22 ENCOUNTER — Encounter (HOSPITAL_COMMUNITY): Payer: Self-pay

## 2022-12-22 DIAGNOSIS — S2232XA Fracture of one rib, left side, initial encounter for closed fracture: Secondary | ICD-10-CM

## 2022-12-22 DIAGNOSIS — Z8673 Personal history of transient ischemic attack (TIA), and cerebral infarction without residual deficits: Secondary | ICD-10-CM | POA: Insufficient documentation

## 2022-12-22 DIAGNOSIS — S0990XA Unspecified injury of head, initial encounter: Secondary | ICD-10-CM | POA: Diagnosis not present

## 2022-12-22 DIAGNOSIS — W19XXXA Unspecified fall, initial encounter: Secondary | ICD-10-CM | POA: Diagnosis not present

## 2022-12-22 DIAGNOSIS — M542 Cervicalgia: Secondary | ICD-10-CM | POA: Insufficient documentation

## 2022-12-22 DIAGNOSIS — Z7401 Bed confinement status: Secondary | ICD-10-CM | POA: Diagnosis not present

## 2022-12-22 DIAGNOSIS — Y92002 Bathroom of unspecified non-institutional (private) residence single-family (private) house as the place of occurrence of the external cause: Secondary | ICD-10-CM | POA: Insufficient documentation

## 2022-12-22 DIAGNOSIS — Z7901 Long term (current) use of anticoagulants: Secondary | ICD-10-CM | POA: Diagnosis not present

## 2022-12-22 DIAGNOSIS — R531 Weakness: Secondary | ICD-10-CM | POA: Insufficient documentation

## 2022-12-22 DIAGNOSIS — R079 Chest pain, unspecified: Secondary | ICD-10-CM | POA: Diagnosis not present

## 2022-12-22 DIAGNOSIS — R001 Bradycardia, unspecified: Secondary | ICD-10-CM | POA: Diagnosis not present

## 2022-12-22 DIAGNOSIS — R0902 Hypoxemia: Secondary | ICD-10-CM | POA: Diagnosis not present

## 2022-12-22 DIAGNOSIS — N3000 Acute cystitis without hematuria: Secondary | ICD-10-CM

## 2022-12-22 DIAGNOSIS — M545 Low back pain, unspecified: Secondary | ICD-10-CM | POA: Diagnosis not present

## 2022-12-22 DIAGNOSIS — R293 Abnormal posture: Secondary | ICD-10-CM | POA: Diagnosis not present

## 2022-12-22 DIAGNOSIS — M25552 Pain in left hip: Secondary | ICD-10-CM | POA: Diagnosis not present

## 2022-12-22 DIAGNOSIS — F0390 Unspecified dementia without behavioral disturbance: Secondary | ICD-10-CM | POA: Diagnosis not present

## 2022-12-22 DIAGNOSIS — M25512 Pain in left shoulder: Secondary | ICD-10-CM | POA: Diagnosis not present

## 2022-12-22 DIAGNOSIS — S199XXA Unspecified injury of neck, initial encounter: Secondary | ICD-10-CM | POA: Diagnosis not present

## 2022-12-22 DIAGNOSIS — I10 Essential (primary) hypertension: Secondary | ICD-10-CM | POA: Diagnosis not present

## 2022-12-22 DIAGNOSIS — R278 Other lack of coordination: Secondary | ICD-10-CM | POA: Diagnosis not present

## 2022-12-22 DIAGNOSIS — S299XXA Unspecified injury of thorax, initial encounter: Secondary | ICD-10-CM | POA: Diagnosis present

## 2022-12-22 LAB — COMPREHENSIVE METABOLIC PANEL
ALT: 21 U/L (ref 0–44)
AST: 16 U/L (ref 15–41)
Albumin: 3.1 g/dL — ABNORMAL LOW (ref 3.5–5.0)
Alkaline Phosphatase: 109 U/L (ref 38–126)
Anion gap: 10 (ref 5–15)
BUN: 11 mg/dL (ref 8–23)
CO2: 25 mmol/L (ref 22–32)
Calcium: 8.6 mg/dL — ABNORMAL LOW (ref 8.9–10.3)
Chloride: 103 mmol/L (ref 98–111)
Creatinine, Ser: 0.76 mg/dL (ref 0.44–1.00)
GFR, Estimated: >60 mL/min (ref >60)
Glucose, Bld: 108 mg/dL — ABNORMAL HIGH (ref 70–99)
Potassium: 4.1 mmol/L (ref 3.5–5.1)
Sodium: 138 mmol/L (ref 135–145)
Total Bilirubin: 0.8 mg/dL (ref 0.3–1.2)
Total Protein: 6.9 g/dL (ref 6.5–8.1)

## 2022-12-22 LAB — CBC
HCT: 41.3 % (ref 36.0–46.0)
Hemoglobin: 13.4 g/dL (ref 12.0–15.0)
MCH: 31.5 pg (ref 26.0–34.0)
MCHC: 32.4 g/dL (ref 30.0–36.0)
MCV: 96.9 fL (ref 80.0–100.0)
Platelets: 350 10*3/uL (ref 150–400)
RBC: 4.26 MIL/uL (ref 3.87–5.11)
RDW: 13 % (ref 11.5–15.5)
WBC: 9.7 10*3/uL (ref 4.0–10.5)
nRBC: 0 % (ref 0.0–0.2)

## 2022-12-22 LAB — URINALYSIS, ROUTINE W REFLEX MICROSCOPIC
Bilirubin Urine: NEGATIVE
Glucose, UA: NEGATIVE mg/dL
Hgb urine dipstick: NEGATIVE
Ketones, ur: 20 mg/dL — AB
Leukocytes,Ua: NEGATIVE
Nitrite: NEGATIVE
Protein, ur: 30 mg/dL — AB
Specific Gravity, Urine: 1.028 (ref 1.005–1.030)
pH: 5 (ref 5.0–8.0)

## 2022-12-22 LAB — SAMPLE TO BLOOD BANK

## 2022-12-22 MED ORDER — CEPHALEXIN 500 MG PO CAPS: 500.0000 mg | ORAL_CAPSULE | Freq: Two times a day (BID) | ORAL | 0 refills | Status: DC

## 2022-12-22 MED ORDER — LIDOCAINE 5 % EX PTCH
1.0000 | MEDICATED_PATCH | CUTANEOUS | Status: DC
  Administered 2022-12-22: 1 via TRANSDERMAL
  Filled 2022-12-22: qty 1

## 2022-12-22 MED ORDER — CEPHALEXIN 250 MG PO CAPS
500.0000 mg | ORAL_CAPSULE | ORAL | Status: AC
  Administered 2022-12-22: 500 mg via ORAL
  Filled 2022-12-22: qty 2

## 2022-12-22 NOTE — ED Notes (Signed)
IV removed for discharge, with catheter intact. Bandage placed, site is clean, dry and intact. Patient tolerated removal well.

## 2022-12-22 NOTE — ED Triage Notes (Signed)
Fall on Sunday. Unwitnessed. Patient was found down on bathroom floor. Takes Plavix. Patient complaining of hip, neck, lower back, left shoulder, and back of head pain. Patient has been more lethargic over the past 2 days per staff at [facility]. Patient does not remember falling.

## 2022-12-22 NOTE — ED Notes (Signed)
Son, Bianca [facility], would like to be notified when pt is on there way back to facility.

## 2022-12-22 NOTE — ED Provider Notes (Signed)
[facility] Provider Note   CSN: [id number] Arrival date & time: 12/22/22  1841     History  Chief Complaint  Patient presents with   Level 2 Fall on Thinners    Bianca Garcia is a 82 y.o. female.  82 year old female with a history of stroke on Plavix, dementia, chronic left hip pain, and cervical and lumbar degenerative disc disease presents emergency department after a fall.  On Sunday patient had an unwitnessed fall while in the bathroom.  Since then has been complaining of neck pain that is severe.  Does take Plavix.  Has had pain to the back of her head.  Is having difficulty moving her left lower extremity but unclear as to when this started or if it is chronic.  They also report that she has been more lethargic than usual over the past 2 days.  Patient has no recollection of falling.       Home Medications Prior to Admission medications   Medication Sig Start Date End Date Taking? Authorizing Provider  cephALEXin (KEFLEX) 500 MG capsule Take 1 capsule (500 mg total) by mouth 2 (two) times daily for 7 days. 12/22/22 12/29/22 Yes Fransico Meadow, MD  acetaminophen (TYLENOL) 325 MG tablet Take 650 mg by mouth 2 (two) times daily as needed.    [provider]  acetaminophen (TYLENOL) 325 MG tablet Take 650 mg by mouth 3 (three) times daily.    [provider]  Calcium Carb-Cholecalciferol (CALCIUM 500 + D PO) Take 1 tablet by mouth daily.    [provider]  calcium carbonate (TUMS - DOSED IN MG ELEMENTAL CALCIUM) 500 MG chewable tablet Chew 2 tablets by mouth 2 (two) times daily as needed for indigestion or heartburn.    [provider]  clonazePAM (KLONOPIN) 0.5 MG tablet Take 0.5 tablets (0.25 mg total) by mouth at bedtime. 10/18/22   Royal [facility], NP  clopidogrel (PLAVIX) 75 MG tablet Take 1 tablet (75 mg total) by mouth daily. 09/23/17   Mary Sella, NP  diclofenac Sodium (VOLTAREN) 1 %  GEL Apply 2 g topically 2 (two) times daily as needed (pain).    [provider]  donepezil (ARICEPT) 10 MG tablet Take 1 tablet (10 mg total) by mouth daily. 03/26/22   Virgie Dad, MD  gabapentin (NEURONTIN) 100 MG capsule Take 200 mg by mouth 3 (three) times daily.    [provider]  HYDROcodone-acetaminophen (NORCO/VICODIN) 5-325 MG tablet Take 1-2 tablets by mouth every 4 (four) hours as needed for moderate pain. 1 tablet for moderate pain or 2 tablets for severe pain 12/17/22 12/17/23  Royal [facility], NP  levothyroxine (SYNTHROID) 88 MCG tablet Take 88 mcg by mouth daily before breakfast.    [provider]  lidocaine 4 % Place 1 patch onto the skin in the morning and at bedtime.    [provider]  melatonin 5 MG TABS Take 5 mg by mouth at bedtime as needed (sleep).    [provider]  memantine (NAMENDA) 5 MG tablet Take 5 mg by mouth 2 (two) times daily. 08/02/22   [provider]  mirabegron ER (MYRBETRIQ) 25 MG TB24 tablet Take 1 tablet (25 mg total) by mouth daily. 09/16/21   Virgie Dad, MD  Multiple Vitamins-Minerals (PRESERVISION AREDS PO) Take 1 tablet by mouth 2 (two) times daily.     [provider]  omeprazole (PRILOSEC) 40 MG capsule Take 40 mg  by mouth daily.    [provider]  propranolol (INDERAL) 20 MG tablet Take 20 mg by mouth 2 (two) times daily. 1 tablet (20 mg) oral, Twice A Day    [provider]  rosuvastatin (CRESTOR) 5 MG tablet Take 1 tablet (5 mg total) by mouth daily. 06/23/22   Virgie Dad, MD      Allergies    Fluticasone, Aciphex [rabeprazole], Codeine, Codeine, Cymbalta [duloxetine hcl], Lexapro [escitalopram], Lipitor [atorvastatin calcium], Morphine sulfate, Pneumovax [pneumococcal polysaccharide vaccine], Prevacid [lansoprazole], Rosuvastatin, Sertraline, Statins, Sulfa antibiotics, Tetracyclines & related, Welchol [colesevelam], and Pneumovax 23 [pneumococcal vac  polyvalent]    Review of Systems   Review of Systems  Physical Exam Updated Vital Signs BP (!) 153/69 (BP Location: Right Arm)   Pulse 66   Temp 97.8 F (36.6 C) (Oral)   Resp 14   Ht 5\' 1"  (1.549 m)   Wt 74.7 kg   LMP 09/28/1979   SpO2 99%   BMI 31.12 kg/m  Physical Exam Vitals and nursing note reviewed.  Constitutional:      General: She is not in acute distress.    Appearance: Normal appearance. She is well-developed. She is not ill-appearing.     Comments: Alert and oriented to person and place.  Unsure of the date.  Per EMS likely is at her baseline  HENT:     Head: Normocephalic and atraumatic.     Right Ear: External ear normal.     Left Ear: External ear normal.     Nose: Nose normal.     Mouth/Throat:     Mouth: Mucous membranes are moist.     Pharynx: Oropharynx is clear.  Eyes:     Extraocular Movements: Extraocular movements intact.     Conjunctiva/sclera: Conjunctivae normal.     Pupils: Pupils are equal, round, and reactive to light.  Neck:     Comments: C-collar in place.  Tenderness palpation at approximate C6 level Cardiovascular:     Rate and Rhythm: Normal rate and regular rhythm.     Pulses: Normal pulses.     Heart sounds: Normal heart sounds. No murmur heard. Pulmonary:     Effort: Pulmonary effort is normal. No respiratory distress.     Breath sounds: Normal breath sounds.  Abdominal:     General: Abdomen is flat. Bowel sounds are normal. There is no distension.     Palpations: Abdomen is soft. There is no mass.     Tenderness: There is no abdominal tenderness. There is no guarding.  Musculoskeletal:        General: No deformity. Normal range of motion.     Cervical back: No rigidity or tenderness.     Right lower leg: No edema.     Left lower leg: No edema.     Comments: No tenderness to palpation of midline thoracic or lumbar spine.  No step-offs palpated.  No tenderness to palpation of chest wall.  No bruising noted.  No tenderness to  palpation of bilateral clavicles.  No tenderness to palpation, bruising, or deformities noted of bilateral shoulders, elbows, wrists, hips, knees, or ankles.  Skin:    General: Skin is warm and dry.  Neurological:     General: No focal deficit present.     Mental Status: She is alert and oriented to person, place, and time. Mental status is at baseline.     Cranial Nerves: No cranial nerve deficit.     Sensory: No sensory deficit.  Motor: Weakness (3/5 strength in left lower extremity) present.  Psychiatric:        Mood and Affect: Mood normal.     ED Results / Procedures / Treatments   Labs (all labs ordered are listed, but only abnormal results are displayed) Labs Reviewed  COMPREHENSIVE METABOLIC PANEL - Abnormal; Notable for the following components:      Result Value   Glucose, Bld 108 (*)    Calcium 8.6 (*)    Albumin 3.1 (*)    All other components within normal limits  URINALYSIS, ROUTINE W REFLEX MICROSCOPIC - Abnormal; Notable for the following components:   Color, Urine AMBER (*)    APPearance CLOUDY (*)    Ketones, ur 20 (*)    Protein, ur 30 (*)    Bacteria, UA FEW (*)    All other components within normal limits  CBC  SAMPLE TO BLOOD BANK    EKG EKG Interpretation  Date/Time:  Wednesday December 22 2022 19:00:51 EDT Ventricular Rate:  66 PR Interval:  166 QRS Duration: 100 QT Interval:  435 QTC Calculation: 456 R Axis:   -20 Text Interpretation: Sinus rhythm Borderline left axis deviation Low voltage, precordial leads RSR' in V1 or V2, probably normal variant Confirmed by Margaretmary Eddy [phone]) on 12/22/2022 7:37:33 PM  Radiology CT HEAD WO CONTRAST  Result Date: 12/22/2022 CLINICAL DATA:  Head trauma, moderate-severe; Polytrauma, blunt EXAM: CT HEAD WITHOUT CONTRAST CT CERVICAL SPINE WITHOUT CONTRAST TECHNIQUE: Multidetector CT imaging of the head and cervical spine was performed following the standard protocol without intravenous contrast.  Multiplanar CT image reconstructions of the cervical spine were also generated. RADIATION DOSE REDUCTION: This exam was performed according to the departmental dose-optimization program which includes automated exposure control, adjustment of the mA and/or kV according to patient size and/or use of iterative reconstruction technique. COMPARISON:  CT head 08/07/2022, CT neck 10/29/2016 FINDINGS: CT HEAD FINDINGS Brain: Patchy and confluent areas of decreased attenuation are noted throughout the deep and periventricular white matter of the cerebral hemispheres bilaterally, compatible with chronic microvascular ischemic disease. No evidence of large-territorial acute infarction. No parenchymal hemorrhage. No mass lesion. No extra-axial collection. No mass effect or midline shift. No hydrocephalus. Basilar cisterns are patent. Vascular: No hyperdense vessel. Skull: No acute fracture or focal lesion. Sinuses/Orbits: Paranasal sinuses and mastoid air cells are clear. Bilateral lens replacement. Otherwise the orbits are unremarkable. Other: None. CT CERVICAL SPINE FINDINGS Alignment: Grade 1 anterolisthesis of T2 on T3. Skull base and vertebrae: C5 through C7 anterior cervical discectomy infusion. Multilevel moderate degenerative changes spine. No associated severe osseous neural foraminal or central canal stenosis. No acute fracture. No aggressive appearing focal osseous lesion or focal pathologic process. Soft tissues and spinal canal: No prevertebral fluid or swelling. No visible canal hematoma. Upper chest: Unremarkable. Other: Aortic arch calcification. IMPRESSION: 1. No acute intracranial abnormality. 2. No acute displaced fracture or traumatic listhesis of the cervical spine. Electronically Signed   By: Iven Finn M.D.   On: 12/22/2022 20:20   CT CERVICAL SPINE WO CONTRAST  Result Date: 12/22/2022 CLINICAL DATA:  Head trauma, moderate-severe; Polytrauma, blunt EXAM: CT HEAD WITHOUT CONTRAST CT CERVICAL  SPINE WITHOUT CONTRAST TECHNIQUE: Multidetector CT imaging of the head and cervical spine was performed following the standard protocol without intravenous contrast. Multiplanar CT image reconstructions of the cervical spine were also generated. RADIATION DOSE REDUCTION: This exam was performed according to the departmental dose-optimization program which includes automated exposure control, adjustment of the mA and/or  kV according to patient size and/or use of iterative reconstruction technique. COMPARISON:  CT head 08/07/2022, CT neck 10/29/2016 FINDINGS: CT HEAD FINDINGS Brain: Patchy and confluent areas of decreased attenuation are noted throughout the deep and periventricular white matter of the cerebral hemispheres bilaterally, compatible with chronic microvascular ischemic disease. No evidence of large-territorial acute infarction. No parenchymal hemorrhage. No mass lesion. No extra-axial collection. No mass effect or midline shift. No hydrocephalus. Basilar cisterns are patent. Vascular: No hyperdense vessel. Skull: No acute fracture or focal lesion. Sinuses/Orbits: Paranasal sinuses and mastoid air cells are clear. Bilateral lens replacement. Otherwise the orbits are unremarkable. Other: None. CT CERVICAL SPINE FINDINGS Alignment: Grade 1 anterolisthesis of T2 on T3. Skull base and vertebrae: C5 through C7 anterior cervical discectomy infusion. Multilevel moderate degenerative changes spine. No associated severe osseous neural foraminal or central canal stenosis. No acute fracture. No aggressive appearing focal osseous lesion or focal pathologic process. Soft tissues and spinal canal: No prevertebral fluid or swelling. No visible canal hematoma. Upper chest: Unremarkable. Other: Aortic arch calcification. IMPRESSION: 1. No acute intracranial abnormality. 2. No acute displaced fracture or traumatic listhesis of the cervical spine. Electronically Signed   By: Iven Finn M.D.   On: 12/22/2022 20:20   DG  Chest 2 View  Result Date: 12/22/2022 CLINICAL DATA:  Chest pain EXAM: CHEST - 2 VIEW COMPARISON:  08/07/2022 FINDINGS: Cardiac shadow is within normal limits. Lungs are well aerated bilaterally. No focal infiltrate or effusion is seen. Postsurgical changes in the cervical spine are seen. Old rib fractures are noted on left involving the fourth and fifth ribs with callus formation. Acute sixth rib fracture is noted. Old rib fractures on the right are noted as well. IMPRESSION: Acute sixth rib fracture on the left. Bilateral chronic rib fractures are noted. Electronically Signed   By: Inez Catalina M.D.   On: 12/22/2022 19:49   DG Shoulder Left  Result Date: 12/22/2022 CLINICAL DATA:  Recent fall with left shoulder pain, initial encounter EXAM: LEFT SHOULDER - 2+ VIEW COMPARISON:  None Available. FINDINGS: Mild degenerative changes of the acromioclavicular and glenohumeral joints are seen. Multiple calcified loose bodies are noted in the inferior aspect of the joint. No acute fracture or dislocation is seen. Fracture of the left sixth rib is noted which is acute. Fractures of the fourth and fifth ribs on the left are noted with callus formation consistent with prior fracture and healing. IMPRESSION: Acute left sixth rib fracture without complicating factors. Healing rib fractures involving the fourth and fifth ribs on the left. Degenerative changes of the shoulder joint. Electronically Signed   By: Inez Catalina M.D.   On: 12/22/2022 19:48   DG Lumbar Spine Complete  Result Date: 12/22/2022 CLINICAL DATA:  Recent fall with low back pain, initial encounter EXAM: LUMBAR SPINE - COMPLETE 4+ VIEW COMPARISON:  06/08/2021 FINDINGS: Five lumbar type vertebral bodies are well visualized. Vertebral body height is well maintained. Anterolisthesis of L4 on L5 and L5 on S1 is noted stable in appearance from the prior exam. Prior laminectomy at L4-5 is seen. Osteophytic changes are seen. Mild scoliosis of the  thoracolumbar spine is noted. No pars defects are seen. Hypertrophic facet changes are noted. No soft tissue abnormality is noted. IMPRESSION: Multilevel degenerative change and postoperative change. No acute abnormality noted. Electronically Signed   By: Inez Catalina M.D.   On: 12/22/2022 19:46   DG Hip Unilat W or Wo Pelvis 2-3 Views Left  Result Date: 12/22/2022 CLINICAL DATA:  Fall several days ago with left hip pain, initial encounter EXAM: DG HIP (WITH OR WITHOUT PELVIS) 3V LEFT COMPARISON:  None Available. FINDINGS: Pelvic ring is intact. No acute fracture or dislocation is noted. No soft tissue abnormality is seen. IMPRESSION: No acute abnormality noted. Electronically Signed   By: Inez Catalina M.D.   On: 12/22/2022 19:44    Procedures Procedures   Medications Ordered in ED Medications  lidocaine (LIDODERM) 5 % 1 patch (1 patch Transdermal Patch Applied 12/22/22 2112)  cephALEXin (KEFLEX) capsule 500 mg (500 mg Oral Given 12/22/22 2143)    ED Course/ Medical Decision Making/ A&P Clinical Course as of 12/22/22 2148  Wed Dec 22, 2022  2027 Chest x-ray with acute left-sided sixth rib fracture with additional chronic fractures noted.  Additional imaging without abnormality. [RP]    Clinical Course User Index [RP] Fransico Meadow, MD                            Medical Decision Making Amount and/or Complexity of Data Reviewed Labs: ordered. Radiology: ordered.  Risk Prescription drug management.   Bianca Garcia is a 82 y.o. female with comorbidities that complicate the patient evaluation including stroke on Plavix, dementia, chronic left hip pain, and cervical and lumbar degenerative disc disease presents emergency department after a fall.    Initial Ddx:  TBI, C-spine injury, stroke, hip fracture, lumbar spine fracture, rib fracture  MDM:  Concern about possible TBI given the patient's fall and Plavix use.  With her neck pain will obtain CT of the C-spine to evaluate for  cervical spine injury.  With her left lower extremity weakness unclear if this is chronic or could be acute so we will talk to family about this to see if the patient's symptoms could be from stroke.  Will obtain x-rays to evaluate for hip and lumbar spine fracture as well.  Plan:  Labs Urinalysis CT head CT C-spine Left shoulder x-ray Lumbar spine x-ray Hip x-ray Chest x-ray  ED Summary/Re-evaluation:  Patient reassessed while in the emergency department and was stable.  Underwent the above workup and was found to have left-sided rib fracture.  Was given incentive spirometer and patient is already on pain medication so we will have her continue this.  Will also have her use lidocaine patches.  Was found to have a possible urinary tract infection and with family saying she may be slightly more confused over the past few days will treat.  No prior cultures so we will treat with keflex.  Did discuss with family and left lower extremity weakness appears to be chronic so feel that stroke is less likely.   This patient presents to the ED for concern of complaints listed in HPI, this involves an extensive number of treatment options, and is a complaint that carries with it a high risk of complications and morbidity. Disposition including potential need for admission considered.   Dispo: DC Home. Return precautions discussed including, but not limited to, those listed in the AVS. Allowed pt time to ask questions which were answered fully prior to dc.  Additional history obtained from family Records reviewed Outpatient Clinic Notes The following labs were independently interpreted: Urinalysis and show urinary tract infection I independently reviewed the following imaging with scope of interpretation limited to determining acute life threatening conditions related to emergency care: CT Head and agree with the radiologist interpretation with the following exceptions: none I personally reviewed and  interpreted cardiac monitoring: normal sinus rhythm  I personally reviewed and interpreted the pt's EKG: see above for interpretation  I have reviewed the patients home medications and made adjustments as needed Social Determinants of health:  SNF resident  Final Clinical Impression(s) / ED Diagnoses Final diagnoses:  Closed fracture of one rib of left side, initial encounter  Acute cystitis without hematuria  Fall, initial encounter    Rx / DC Orders ED Discharge Orders          Ordered    Incentive spirometry RT        12/22/22 2133    cephALEXin (KEFLEX) 500 MG capsule  2 times daily        12/22/22 2135              Fransico Meadow, MD 12/22/22 2148

## 2022-12-22 NOTE — Discharge Instructions (Addendum)
You were seen for your rib pain in the emergency department.  You have a rib fracture and a urinary tract infection.  At home, please take Tylenol and use lidocaine patches we have prescribed you for your pain.  Please also use the incentive spirometer we have given you to prevent any pneumonias.  Take the antibiotics to prevent worsening urinary tract infection  Check your MyChart online for the results of any tests that had not resulted by the time you left the emergency department.   Follow-up with your primary doctor in 2-3 days regarding your visit.    Return immediately to the emergency department if you experience any of the following: Difficulty breathing, fever, severe pain, flank pain, or any other concerning symptoms.    Thank you for visiting our Emergency Department. It was a pleasure taking care of you today.

## 2022-12-23 ENCOUNTER — Encounter: Payer: Self-pay | Admitting: Adult Health

## 2022-12-23 ENCOUNTER — Non-Acute Institutional Stay (SKILLED_NURSING_FACILITY): Payer: Medicare Other | Admitting: Adult Health

## 2022-12-23 DIAGNOSIS — E039 Hypothyroidism, unspecified: Secondary | ICD-10-CM

## 2022-12-23 DIAGNOSIS — R251 Tremor, unspecified: Secondary | ICD-10-CM

## 2022-12-23 DIAGNOSIS — S2242XA Multiple fractures of ribs, left side, initial encounter for closed fracture: Secondary | ICD-10-CM | POA: Diagnosis not present

## 2022-12-23 DIAGNOSIS — M25552 Pain in left hip: Secondary | ICD-10-CM | POA: Diagnosis not present

## 2022-12-23 DIAGNOSIS — T148XXA Other injury of unspecified body region, initial encounter: Secondary | ICD-10-CM | POA: Diagnosis not present

## 2022-12-23 DIAGNOSIS — E782 Mixed hyperlipidemia: Secondary | ICD-10-CM | POA: Diagnosis not present

## 2022-12-23 DIAGNOSIS — M48062 Spinal stenosis, lumbar region with neurogenic claudication: Secondary | ICD-10-CM

## 2022-12-23 DIAGNOSIS — M542 Cervicalgia: Secondary | ICD-10-CM | POA: Diagnosis not present

## 2022-12-23 DIAGNOSIS — R278 Other lack of coordination: Secondary | ICD-10-CM | POA: Diagnosis not present

## 2022-12-23 DIAGNOSIS — F0150 Vascular dementia without behavioral disturbance: Secondary | ICD-10-CM

## 2022-12-23 DIAGNOSIS — N3000 Acute cystitis without hematuria: Secondary | ICD-10-CM | POA: Diagnosis not present

## 2022-12-23 DIAGNOSIS — K5903 Drug induced constipation: Secondary | ICD-10-CM | POA: Diagnosis not present

## 2022-12-23 DIAGNOSIS — R293 Abnormal posture: Secondary | ICD-10-CM | POA: Diagnosis not present

## 2022-12-23 DIAGNOSIS — M25551 Pain in right hip: Secondary | ICD-10-CM | POA: Diagnosis not present

## 2022-12-23 NOTE — Progress Notes (Signed)
Location:  Occupational psychologist of Service:  SNF (31) Provider:   Cindi Carbon, [place] [phone]   Virgie Dad, MD  Patient Care Team: Virgie Dad, MD as PCP - General (Internal Medicine) Nahser, Wonda Cheng, MD as PCP - Cardiology (Cardiology) Clarene Essex, MD as Consulting Physician (Gastroenterology) Marcial Pacas, MD as Consulting Physician (Neurology) Kristeen Miss, MD as Consulting Physician (Neurosurgery) Martinique, Amy, MD as Consulting Physician (Dermatology) Megan Salon, MD as Consulting Physician (Gynecology) [place], Heather, [place] (Optometry) Virgie Dad, MD (Internal Medicine)  Extended Emergency Contact Information Primary Emergency Contact: Eberlin,Robert J Address: [address]          [place], [place] [address] [name] of [place] Phone: [phone] Work Phone: [phone] Mobile Phone: [phone] Relation: Spouse Secondary Emergency Contact: Quitman Livings of [place] Phone: [phone] Mobile Phone: [phone] Relation: Son  Code Status:  DNR Goals of care: Advanced Directive information    12/22/2022    6:57 PM  Advanced Directives  Does Patient Have a Medical Advance Directive? Yes     Chief Complaint  Patient presents with   Medical Management of Chronic Issues    HPI:  Pt is a 82 y.o. female seen today for medical management of chronic diseases.    PMH significant for vascular dementia, CVA in 2018, hypothyroid, HLD, chronic back pain with decompression laminectomy 06/08/21   Frequent falls. Working with therapy  Sleeping more on norco which is being given for severe left hip pain and now neck pain.   Seen in the ED 12/22/22 for an unwitnessed fall with neck pain afterward. She takes plavix. CXR showed left sided 6th rib fx. CT of head and neck unremarkable for acute findings. Both hips were xrayed and left shoulder which were unremarkable. Started on keflex  for possible UTI.    March 2024 MRI of hips Tear of the left gluteus minimus tendon High grade parital near complete tear of the right gluteus minimus tendon insertion  Partial tear of the left hamstring Mild hip OA Mild left brevis muscle strain   Hospitalized for syncope Nov 2023. No acute findings, neg head CT and EKG, thought to be vasovagal.  Has tremor and twitching, started on propranolol for tremor.  Chronic anxiety on clonazepam. Failed trial of buspar with possible s/e.   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 Street of back injections by Dr. Brien Few 2016 with good relief, history of cervical DD with possiblity of surgery by Dr. Trenton Gammon.   GERD (gastroesophageal reflux disease)    IBS with moderate refluc seen on upper GI study form January 2011 Dr. Watt Climes   HTN (hypertension)    no meds   Hyperlipidemia    Hypothyroidism    IBS (irritable bowel syndrome)    Imbalance    Impingement syndrome of right shoulder 01/05/2013   Mass of left side of neck    Memory loss    Mood disorder (HCC)    MVP (mitral valve prolapse)    Hx of    Osteopenia    Peripheral edema    Right rotator cuff  tear 01/05/2013   Sciatic pain    recurrent   Seasonal allergies    Stroke [facility])    Tension headache    TIA (transient ischemic attack)    Tremor    Past Surgical History:  Procedure Laterality Date   ANTERIOR CERVICAL DECOMP/DISCECTOMY FUSION  04/2015    Dr. Pamala Hurry, [place]  2004   which revealed smooth and normal coronary arteries   CARPAL TUNNEL RELEASE  2000   lt   CATARACT EXTRACTION W/ INTRAOCULAR LENS  IMPLANT, BILATERAL  1/17   Dr. Lucita Ferrara   COSMETIC SURGERY  2010    eyes-facial   [place]   LASIK     LOOP RECORDER INSERTION N/A 09/22/2017   Procedure: LOOP RECORDER INSERTION;  Surgeon: Constance Haw, MD;  Location: [place] CV LAB;  Service: Cardiovascular;  Laterality: N/A;   LUMBAR LAMINECTOMY/DECOMPRESSION MICRODISCECTOMY N/A 06/08/2021   Procedure: Laminectomy and Foraminotomy - L4-L5, L5-S1;  Surgeon: Earnie Larsson, MD;  Location: [place];  Service: Neurosurgery;  Laterality: N/A;   SHOULDER ARTHROSCOPY WITH ROTATOR CUFF REPAIR Right 01/05/2013   Procedure: SHOULDER ARTHROSCOPY WITH ARTHROSCOPIC  ROTATOR CUFF REPAIR, ACROMIOPLASTY, EXTENSIVE DEBRIDEMENT;  Surgeon: Johnny Bridge, MD;  Location: [place];  Service: Orthopedics;  Laterality: Right;   TEE WITHOUT CARDIOVERSION N/A 09/22/2017   Procedure: TRANSESOPHAGEAL ECHOCARDIOGRAM (TEE);  Surgeon: Sanda Klein, MD;  Location: [facility];  Service: Cardiovascular;  Laterality: N/A;   TONSILLECTOMY     TUBAL LIGATION      Allergies  Allergen Reactions   Fluticasone Other (See Comments)    DRY EYES   Aciphex [Rabeprazole] Other (See Comments)    Critical    Codeine Nausea Only   Codeine Other (See Comments)    unknown   Cymbalta [Duloxetine Hcl] Other (See Comments)    Critical    Lexapro [Escitalopram] Other (See Comments)    critical   Lipitor [Atorvastatin Calcium] Other (See Comments)    Intolerant    Morphine Sulfate     Per matrix   Pneumovax [Pneumococcal Polysaccharide Vaccine] Other (See Comments)    moderate   Prevacid [Lansoprazole] Other (See Comments)    Unknown, listed on MAR   Rosuvastatin Other (See Comments)    Intolerant    Sertraline Other (See Comments)    critical   Statins Other (See Comments)    Critical    Sulfa Antibiotics Nausea And Vomiting   Tetracyclines & Related Other (See Comments)    Morphine Sulfate   Welchol [Colesevelam] Other (See Comments)    Unknown, listed on MAR   Pneumovax 23 [Pneumococcal Vac  Polyvalent] Rash    Injection site reaction    Outpatient Encounter Medications as of 12/23/2022  Medication Sig   acetaminophen (TYLENOL) 325 MG tablet Take 650 mg by mouth 2 (two) times daily as needed.   acetaminophen (TYLENOL) 325 MG tablet Take 650 mg by mouth 3 (three) times daily.   Calcium Carb-Cholecalciferol (CALCIUM 500 + D PO) Take 1 tablet by mouth daily.   calcium carbonate (TUMS - DOSED IN MG ELEMENTAL CALCIUM) 500 MG chewable tablet Chew 2 tablets by mouth 2 (two) times daily as needed for indigestion or heartburn.   cephALEXin (KEFLEX) 500 MG capsule Take 1 capsule (500 mg total) by mouth 2 (two) times daily for 7 days.   clonazePAM (KLONOPIN) 0.5 MG tablet Take 0.5 tablets (0.25 mg total) by mouth at bedtime.   clopidogrel (PLAVIX) 75 MG  tablet Take 1 tablet (75 mg total) by mouth daily.   diclofenac Sodium (VOLTAREN) 1 % GEL Apply 2 g topically 2 (two) times daily as needed (pain).   donepezil (ARICEPT) 10 MG tablet Take 1 tablet (10 mg total) by mouth daily.   gabapentin (NEURONTIN) 100 MG capsule Take 200 mg by mouth 3 (three) times daily.   HYDROcodone-acetaminophen (NORCO/VICODIN) 5-325 MG tablet Take 1-2 tablets by mouth every 4 (four) hours as needed for moderate pain. 1 tablet for moderate pain or 2 tablets for severe pain   levothyroxine (SYNTHROID) 88 MCG tablet Take 88 mcg by mouth daily before breakfast.   lidocaine 4 % Place 1 patch onto the skin in the morning and at bedtime.   melatonin 5 MG TABS Take 5 mg by mouth at bedtime as needed (sleep).   memantine (NAMENDA) 5 MG tablet Take 5 mg by mouth 2 (two) times daily.   mirabegron ER (MYRBETRIQ) 25 MG TB24 tablet Take 1 tablet (25 mg total) by mouth daily.   Multiple Vitamins-Minerals (PRESERVISION AREDS PO) Take 1 tablet by mouth 2 (two) times daily.    omeprazole (PRILOSEC) 40 MG capsule Take 40 mg by mouth daily.   propranolol (INDERAL) 20 MG tablet Take 20 mg by mouth 2 (two) times daily. 1 tablet (20 mg)  oral, Twice A Day   rosuvastatin (CRESTOR) 5 MG tablet Take 1 tablet (5 mg total) by mouth daily.   No facility-administered encounter medications on file as of 12/23/2022.    Review of Systems  Constitutional:  Positive for activity change and fatigue. Negative for appetite change, chills, diaphoresis, fever and unexpected weight change.  HENT:  Negative for congestion.   Respiratory:  Negative for cough, shortness of breath and wheezing.   Cardiovascular:  Negative for chest pain, palpitations and leg swelling.  Gastrointestinal:  Negative for abdominal distention, abdominal pain, constipation and diarrhea.  Genitourinary:  Negative for difficulty urinating and dysuria.  Musculoskeletal:  Positive for arthralgias, back pain, gait problem and neck pain. Negative for joint swelling and myalgias.  Neurological:  Positive for weakness. Negative for dizziness, tremors, seizures, syncope, facial asymmetry, speech difficulty, light-headedness, numbness and headaches.  Psychiatric/Behavioral:  Positive for confusion. Negative for agitation and behavioral problems. The patient is nervous/anxious.     Immunization History  Administered Date(s) Administered   Fluad Quad(high Dose 65+) 07/02/2022   Influenza, High Dose Seasonal PF 07/23/2017, 07/03/2019   Influenza-Unspecified 05/27/2010, 08/03/2010, 07/22/2011, 07/01/2012, 06/22/2013, 07/18/2020, 07/11/2021, 07/02/2022   Janssen (J&J) SARS-COV-2 Vaccination 11/06/2019   Moderna SARS-COV2 Booster Vaccination 08/05/2021   Moderna Sars-Covid-2 Vaccination 08/07/2020   PFIZER(Purple Top)SARS-COV-2 Vaccination 10/09/2019, 08/07/2020   Pneumococcal Conjugate-13 02/08/2015   Pneumococcal Polysaccharide-23 05/28/2006, 10/13/2011   Pneumococcal-Unspecified 10/17/2013   Td 03/29/2002   Tdap 09/27/2001, 04/25/2002, 08/18/2012, 10/17/2013   Zoster Recombinat (Shingrix) 05/23/2021, 07/23/2021   Zoster, Live 08/30/2006   Zoster, Unspecified 08/30/2006    Pertinent  Health Maintenance Due  Topic Date Due   INFLUENZA VACCINE  Completed   DEXA SCAN  Completed      08/07/2022   11:17 AM 08/07/2022   11:03 PM 08/08/2022    8:00 PM 08/09/2022    8:00 AM 08/17/2022   10:59 AM  Fall Risk  Falls in the past year?     1  Was there an injury with Fall?     1  Fall Risk Category Calculator     3  Fall Risk Category (Retired)     High  (RETIRED)  Patient Fall Risk Level High fall risk High fall risk High fall risk High fall risk High fall risk  Patient at Risk for Falls Due to     History of fall(s);Impaired balance/gait;Impaired mobility  Fall risk Follow up     Falls evaluation completed   Functional Status Survey:    Vitals:   12/23/22 1647  BP: (!) 140/76  Pulse: 62  Resp: 20  Weight: 152 lb 4.8 oz (69.1 kg)   Body mass index is 28.78 kg/m. Physical Exam Vitals and nursing note reviewed.  Constitutional:      General: She is not in acute distress.    Appearance: She is not diaphoretic.  HENT:     Head: Normocephalic and atraumatic.  Neck:     Vascular: No JVD.  Cardiovascular:     Rate and Rhythm: Normal rate and regular rhythm.     Heart sounds: No murmur heard. Pulmonary:     Effort: Pulmonary effort is normal. No respiratory distress.     Breath sounds: Normal breath sounds. No wheezing.  Abdominal:     General: Bowel sounds are normal. There is no distension.     Palpations: Abdomen is soft.     Tenderness: There is no abdominal tenderness.  Musculoskeletal:     Comments: NO pain on palpation of either hip. No pain on ROM of either hip.  Pain elicited with ROM of neck. No deformity of either hip joint or neck. Has tight muscles.   Skin:    General: Skin is warm and dry.  Neurological:     General: No focal deficit present.     Mental Status: She is alert.  Psychiatric:        Mood and Affect: Mood normal.     Labs reviewed: Recent Labs    08/07/22 1122 08/07/22 1615 08/07/22 2343 08/16/22 0000  08/16/22 1300 12/22/22 1850  NA 141  --    < > 142 142 138  K 4.2  --    < > 4.1 4.1 4.1  CL 104  --   --  104 104 103  CO2 29  --   --  23* 23* 25  GLUCOSE 99  --   --   --   --  108*  BUN 14  --   --  10 10 11   CREATININE 0.95  --   --  0.7 0.7 0.76  CALCIUM 9.2  --   --  9.3 9.3 8.6*  MG  --  2.2  --   --   --   --   PHOS  --  4.0  --   --   --   --    < > = values in this interval not displayed.   Recent Labs    08/07/22 1615 08/16/22 0000 08/16/22 1300 12/22/22 1850  AST 20 16 16 16   ALT 14 14 14 21   ALKPHOS 69 100 100 109  BILITOT 0.5  --   --  0.8  PROT 6.8  --   --  6.9  ALBUMIN 3.6 4.0 4.0 3.1*   Recent Labs    08/07/22 1122 08/07/22 2343 08/18/22 0000 12/22/22 1850  WBC 9.9  --  7.2 9.7  NEUTROABS 7.5  --   --   --   HGB 14.1 12.6 13.5 13.4  HCT 43.4 37.0 41 41.3  MCV 98.9  --   --  96.9  PLT 301  --  298 350   Lab  Results  Component Value Date   TSH 1.147 08/07/2022   Lab Results  Component Value Date   HGBA1C 5.6 09/21/2017   Lab Results  Component Value Date   CHOL 174 03/23/2022   HDL 66 03/23/2022   LDLCALC 81 03/23/2022   TRIG 134 03/23/2022   CHOLHDL 2.1 09/21/2017    Significant Diagnostic Results in last 30 days:  CT HEAD WO CONTRAST  Result Date: 12/22/2022 CLINICAL DATA:  Head trauma, moderate-severe; Polytrauma, blunt EXAM: CT HEAD WITHOUT CONTRAST CT CERVICAL SPINE WITHOUT CONTRAST TECHNIQUE: Multidetector CT imaging of the head and cervical spine was performed following the standard protocol without intravenous contrast. Multiplanar CT image reconstructions of the cervical spine were also generated. RADIATION DOSE REDUCTION: This exam was performed according to the departmental dose-optimization program which includes automated exposure control, adjustment of the mA and/or kV according to patient size and/or use of iterative reconstruction technique. COMPARISON:  CT head 08/07/2022, CT neck 10/29/2016 FINDINGS: CT HEAD FINDINGS  Brain: Patchy and confluent areas of decreased attenuation are noted throughout the deep and periventricular white matter of the cerebral hemispheres bilaterally, compatible with chronic microvascular ischemic disease. No evidence of large-territorial acute infarction. No parenchymal hemorrhage. No mass lesion. No extra-axial collection. No mass effect or midline shift. No hydrocephalus. Basilar cisterns are patent. Vascular: No hyperdense vessel. Skull: No acute fracture or focal lesion. Sinuses/Orbits: Paranasal sinuses and mastoid air cells are clear. Bilateral lens replacement. Otherwise the orbits are unremarkable. Other: None. CT CERVICAL SPINE FINDINGS Alignment: Grade 1 anterolisthesis of T2 on T3. Skull base and vertebrae: C5 through C7 anterior cervical discectomy infusion. Multilevel moderate degenerative changes spine. No associated severe osseous neural foraminal or central canal stenosis. No acute fracture. No aggressive appearing focal osseous lesion or focal pathologic process. Soft tissues and spinal canal: No prevertebral fluid or swelling. No visible canal hematoma. Upper chest: Unremarkable. Other: Aortic arch calcification. IMPRESSION: 1. No acute intracranial abnormality. 2. No acute displaced fracture or traumatic listhesis of the cervical spine. Electronically Signed   By: Iven Finn M.D.   On: 12/22/2022 20:20   CT CERVICAL SPINE WO CONTRAST  Result Date: 12/22/2022 CLINICAL DATA:  Head trauma, moderate-severe; Polytrauma, blunt EXAM: CT HEAD WITHOUT CONTRAST CT CERVICAL SPINE WITHOUT CONTRAST TECHNIQUE: Multidetector CT imaging of the head and cervical spine was performed following the standard protocol without intravenous contrast. Multiplanar CT image reconstructions of the cervical spine were also generated. RADIATION DOSE REDUCTION: This exam was performed according to the departmental dose-optimization program which includes automated exposure control, adjustment of the mA  and/or kV according to patient size and/or use of iterative reconstruction technique. COMPARISON:  CT head 08/07/2022, CT neck 10/29/2016 FINDINGS: CT HEAD FINDINGS Brain: Patchy and confluent areas of decreased attenuation are noted throughout the deep and periventricular white matter of the cerebral hemispheres bilaterally, compatible with chronic microvascular ischemic disease. No evidence of large-territorial acute infarction. No parenchymal hemorrhage. No mass lesion. No extra-axial collection. No mass effect or midline shift. No hydrocephalus. Basilar cisterns are patent. Vascular: No hyperdense vessel. Skull: No acute fracture or focal lesion. Sinuses/Orbits: Paranasal sinuses and mastoid air cells are clear. Bilateral lens replacement. Otherwise the orbits are unremarkable. Other: None. CT CERVICAL SPINE FINDINGS Alignment: Grade 1 anterolisthesis of T2 on T3. Skull base and vertebrae: C5 through C7 anterior cervical discectomy infusion. Multilevel moderate degenerative changes spine. No associated severe osseous neural foraminal or central canal stenosis. No acute fracture. No aggressive appearing focal osseous lesion or focal pathologic  process. Soft tissues and spinal canal: No prevertebral fluid or swelling. No visible canal hematoma. Upper chest: Unremarkable. Other: Aortic arch calcification. IMPRESSION: 1. No acute intracranial abnormality. 2. No acute displaced fracture or traumatic listhesis of the cervical spine. Electronically Signed   By: Iven Finn M.D.   On: 12/22/2022 20:20   DG Chest 2 View  Result Date: 12/22/2022 CLINICAL DATA:  Chest pain EXAM: CHEST - 2 VIEW COMPARISON:  08/07/2022 FINDINGS: Cardiac shadow is within normal limits. Lungs are well aerated bilaterally. No focal infiltrate or effusion is seen. Postsurgical changes in the cervical spine are seen. Old rib fractures are noted on left involving the fourth and fifth ribs with callus formation. Acute sixth rib fracture is  noted. Old rib fractures on the right are noted as well. IMPRESSION: Acute sixth rib fracture on the left. Bilateral chronic rib fractures are noted. Electronically Signed   By: Inez Catalina M.D.   On: 12/22/2022 19:49   DG Shoulder Left  Result Date: 12/22/2022 CLINICAL DATA:  Recent fall with left shoulder pain, initial encounter EXAM: LEFT SHOULDER - 2+ VIEW COMPARISON:  None Available. FINDINGS: Mild degenerative changes of the acromioclavicular and glenohumeral joints are seen. Multiple calcified loose bodies are noted in the inferior aspect of the joint. No acute fracture or dislocation is seen. Fracture of the left sixth rib is noted which is acute. Fractures of the fourth and fifth ribs on the left are noted with callus formation consistent with prior fracture and healing. IMPRESSION: Acute left sixth rib fracture without complicating factors. Healing rib fractures involving the fourth and fifth ribs on the left. Degenerative changes of the shoulder joint. Electronically Signed   By: Inez Catalina M.D.   On: 12/22/2022 19:48   DG Lumbar Spine Complete  Result Date: 12/22/2022 CLINICAL DATA:  Recent fall with low back pain, initial encounter EXAM: LUMBAR SPINE - COMPLETE 4+ VIEW COMPARISON:  06/08/2021 FINDINGS: Five lumbar type vertebral bodies are well visualized. Vertebral body height is well maintained. Anterolisthesis of L4 on L5 and L5 on S1 is noted stable in appearance from the prior exam. Prior laminectomy at L4-5 is seen. Osteophytic changes are seen. Mild scoliosis of the thoracolumbar spine is noted. No pars defects are seen. Hypertrophic facet changes are noted. No soft tissue abnormality is noted. IMPRESSION: Multilevel degenerative change and postoperative change. No acute abnormality noted. Electronically Signed   By: Inez Catalina M.D.   On: 12/22/2022 19:46   DG Hip Unilat W or Wo Pelvis 2-3 Views Left  Result Date: 12/22/2022 CLINICAL DATA:  Fall several days ago with left hip  pain, initial encounter EXAM: DG HIP (WITH OR WITHOUT PELVIS) 3V LEFT COMPARISON:  None Available. FINDINGS: Pelvic ring is intact. No acute fracture or dislocation is noted. No soft tissue abnormality is seen. IMPRESSION: No acute abnormality noted. Electronically Signed   By: Inez Catalina M.D.   On: 12/22/2022 19:44    Assessment/Plan 1. Closed fracture of multiple ribs of left side, initial encounter Due to fall Mild pain  Fall prec Not really able to f/c for IS well  2. Left hip pain Went to ortho to have injection 3/28 but was not having pain so she was not injected No pain on exam for me Has norco  Lidocaine patch and tylenol   3. Neck pain Likely due to fall with muscle tightness and no acute fx Has soft collar for support No findings on CT Has hx of degenerative changes, possible  aggravated during fall.   4. Acute cystitis without hematuria Currently on keflex, no symptoms  5. Hypothyroidism, unspecified type Lab Results  Component Value Date   TSH 1.147 08/07/2022   Continue synthroid   6. Vascular dementia without behavioral disturbance (HCC) On plavix On namenda Moderate to severe Progressing over the past year Appropriate for skilled care  7. Lumbar stenosis with neurogenic claudication S/p surgery Saw ortho today, no new orders Has norco for pain  8. Tremor Continue propranolol   9. Mixed hyperlipidemia Lab Results  Component Value Date   LDLCALC 81 03/23/2022   Continue crestor   10. Muscle tear Right and left glueus tendons and hamstring area ?due to fall Followed by ortho   11. Constipation  Add Miralax 17 grams daily   Family/ staff Communication: nurse  Labs/tests ordered:  NA

## 2022-12-24 MED ORDER — POLYETHYLENE GLYCOL 3350 17 G PO PACK: 17.0000 g | PACK | Freq: Every day | ORAL | 0 refills | Status: DC

## 2022-12-27 ENCOUNTER — Encounter: Payer: Self-pay | Admitting: Internal Medicine

## 2022-12-27 ENCOUNTER — Non-Acute Institutional Stay (SKILLED_NURSING_FACILITY): Payer: Medicare Other | Admitting: Internal Medicine

## 2022-12-27 DIAGNOSIS — M25552 Pain in left hip: Secondary | ICD-10-CM

## 2022-12-27 DIAGNOSIS — M25531 Pain in right wrist: Secondary | ICD-10-CM | POA: Diagnosis not present

## 2022-12-27 DIAGNOSIS — R278 Other lack of coordination: Secondary | ICD-10-CM | POA: Diagnosis not present

## 2022-12-27 DIAGNOSIS — M85831 Other specified disorders of bone density and structure, right forearm: Secondary | ICD-10-CM | POA: Diagnosis not present

## 2022-12-27 DIAGNOSIS — M19031 Primary osteoarthritis, right wrist: Secondary | ICD-10-CM | POA: Diagnosis not present

## 2022-12-27 DIAGNOSIS — M8588 Other specified disorders of bone density and structure, other site: Secondary | ICD-10-CM | POA: Diagnosis not present

## 2022-12-27 DIAGNOSIS — M25431 Effusion, right wrist: Secondary | ICD-10-CM | POA: Diagnosis not present

## 2022-12-27 DIAGNOSIS — F0150 Vascular dementia without behavioral disturbance: Secondary | ICD-10-CM | POA: Diagnosis not present

## 2022-12-27 DIAGNOSIS — S2242XA Multiple fractures of ribs, left side, initial encounter for closed fracture: Secondary | ICD-10-CM | POA: Diagnosis not present

## 2022-12-27 DIAGNOSIS — M19041 Primary osteoarthritis, right hand: Secondary | ICD-10-CM | POA: Diagnosis not present

## 2022-12-27 DIAGNOSIS — M542 Cervicalgia: Secondary | ICD-10-CM | POA: Diagnosis not present

## 2022-12-27 DIAGNOSIS — R293 Abnormal posture: Secondary | ICD-10-CM | POA: Diagnosis not present

## 2022-12-27 NOTE — Progress Notes (Signed)
Location:  [facility] Room Number: 146A Place of Service:  SNF [phone]) Provider:  Virgie Dad, MD   Virgie Dad, MD  Patient Care Team: Virgie Dad, MD as PCP - General (Internal Medicine) Nahser, Wonda Cheng, MD as PCP - Cardiology (Cardiology) Clarene Essex, MD as Consulting Physician (Gastroenterology) Marcial Pacas, MD as Consulting Physician (Neurology) Kristeen Miss, MD as Consulting Physician (Neurosurgery) [place], Amy, MD as Consulting Physician (Dermatology) Megan Salon, MD as Consulting Physician (Gynecology) [place], Heather, [place] (Optometry) Virgie Dad, MD (Internal Medicine)  Extended Emergency Contact Information Primary Emergency Contact: Gosline,Robert J Address: [address]          [place], [place] [address] [name] of [place] Phone: [phone] Work Phone: [phone] Mobile Phone: [phone] Relation: Spouse Secondary Emergency Contact: Quitman Livings of [place] Phone: [phone] Mobile Phone: [phone] Relation: Son  Code Status:  DNR Goals of care: Advanced Directive information    12/27/2022    3:59 PM  Advanced Directives  Does Patient Have a Medical Advance Directive? Yes  Type of Paramedic of [place];Living will;Out of facility DNR (pink MOST or yellow form)  Does patient want to make changes to medical advance directive? No - Patient declined  Copy of [place] in Chart? Yes - validated most recent copy scanned in chart (See row information)  Pre-existing out of facility DNR order (yellow form or pink MOST form) Yellow form placed in chart (order not valid for inpatient use)     Chief Complaint  Patient presents with   Acute Visit    Patient is being seen for pain     HPI:  Pt is a 82 y.o. female seen today for an acute visit for Right Wrist pain and swelling  Lives in SNF in [place]   She fell in the bathroom few  days ago when she went there by herself After few days was send to ED due to severe pain in her neck CT scan of her Neck and Head did not show anything Acute Her Chest Xray showed 6th rib Fracture Also diagnosed with UTI and now on Keflex She was send back to [facility]  Today Nurses noticed Swelling of her Right Wrist  Very tender Patient unable to give much History She also having some cough   Past Medical History:  Diagnosis Date   Anxiety    Arthritis    Biceps tendonitis 01/05/2013   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 Street of back injections by Dr. Brien Few 2016 with good relief, history of cervical DD with possiblity of surgery by Dr. [facility] Gammon.   GERD (gastroesophageal reflux disease)    IBS with moderate refluc seen on upper GI study form January 2011 Dr. Watt Climes   HTN (hypertension)    no meds   Hyperlipidemia    Hypothyroidism    IBS (irritable bowel syndrome)    Imbalance    Impingement syndrome of right shoulder 01/05/2013   Mass of left side of neck    Memory loss    Mood disorder    MVP (mitral valve prolapse)    Hx of    Osteopenia    Peripheral edema    Right rotator cuff tear 01/05/2013  Sciatic pain    recurrent   Seasonal allergies    Stroke    Tension headache    TIA (transient ischemic attack)    Tremor    Past Surgical History:  Procedure Laterality Date   ANTERIOR CERVICAL DECOMP/DISCECTOMY FUSION  04/2015    Dr. Pamala Hurry, [place]  2004   which revealed smooth and normal coronary arteries   CARPAL TUNNEL RELEASE  2000   lt   CATARACT EXTRACTION W/ INTRAOCULAR LENS  IMPLANT, BILATERAL  1/17   Dr. Lucita Ferrara   COSMETIC SURGERY  2010   eyes-facial   DENTAL SURGERY  1960   LASIK     LOOP RECORDER INSERTION N/A 09/22/2017   Procedure: LOOP  RECORDER INSERTION;  Surgeon: Constance Haw, MD;  Location: [facility];  Service: Cardiovascular;  Laterality: N/A;   LUMBAR LAMINECTOMY/DECOMPRESSION MICRODISCECTOMY N/A 06/08/2021   Procedure: Laminectomy and Foraminotomy - L4-L5, L5-S1;  Surgeon: Earnie Larsson, MD;  Location: [facility];  Service: Neurosurgery;  Laterality: N/A;   SHOULDER ARTHROSCOPY WITH ROTATOR CUFF REPAIR Right 01/05/2013   Procedure: SHOULDER ARTHROSCOPY WITH ARTHROSCOPIC  ROTATOR CUFF REPAIR, ACROMIOPLASTY, EXTENSIVE DEBRIDEMENT;  Surgeon: Johnny Bridge, MD;  Location: [facility];  Service: Orthopedics;  Laterality: Right;   TEE WITHOUT CARDIOVERSION N/A 09/22/2017   Procedure: TRANSESOPHAGEAL ECHOCARDIOGRAM (TEE);  Surgeon: Sanda Klein, MD;  Location: [facility];  Service: Cardiovascular;  Laterality: N/A;   TONSILLECTOMY     TUBAL LIGATION      Allergies  Allergen Reactions   Fluticasone Other (See Comments)    DRY EYES   Aciphex [Rabeprazole] Other (See Comments)    Critical    Codeine Nausea Only   Codeine Other (See Comments)    unknown   Cymbalta [Duloxetine Hcl] Other (See Comments)    Critical    Lexapro [Escitalopram] Other (See Comments)    critical   Lipitor [Atorvastatin Calcium] Other (See Comments)    Intolerant    Morphine Sulfate     Per matrix   Pneumovax [Pneumococcal Polysaccharide Vaccine] Other (See Comments)    moderate   Prevacid [Lansoprazole] Other (See Comments)    Unknown, listed on MAR   Rosuvastatin Other (See Comments)    Intolerant    Sertraline Other (See Comments)    critical   Statins Other (See Comments)    Critical    Sulfa Antibiotics Nausea And Vomiting   Tetracyclines & Related Other (See Comments)    Morphine Sulfate   Welchol [Colesevelam] Other (See Comments)    Unknown, listed on MAR   Pneumovax 23 [Pneumococcal Vac Polyvalent] Rash    Injection site reaction    Outpatient Encounter Medications as of 12/27/2022  Medication  Sig   acetaminophen (TYLENOL) 325 MG tablet Take 650 mg by mouth 2 (two) times daily as needed.   acetaminophen (TYLENOL) 325 MG tablet Take 650 mg by mouth 3 (three) times daily.   Calcium Carb-Cholecalciferol (CALCIUM 500 + D PO) Take 1 tablet by mouth daily.   calcium carbonate (TUMS - DOSED IN MG ELEMENTAL CALCIUM) 500 MG chewable tablet Chew 2 tablets by mouth 2 (two) times daily as needed for indigestion or heartburn.   cephALEXin (KEFLEX) 500 MG capsule Take 1 capsule (500 mg total) by mouth 2 (two) times daily for 7 days.   clonazePAM (KLONOPIN) 0.5 MG tablet Take 0.5 tablets (0.25 mg total) by mouth at bedtime.   clopidogrel (PLAVIX) 75 MG tablet Take 1 tablet (75  mg total) by mouth daily.   diclofenac Sodium (VOLTAREN) 1 % GEL Apply 2 g topically 2 (two) times daily as needed (pain).   donepezil (ARICEPT) 10 MG tablet Take 1 tablet (10 mg total) by mouth daily.   HYDROcodone-acetaminophen (NORCO/VICODIN) 5-325 MG tablet Take 1-2 tablets by mouth every 4 (four) hours as needed for moderate pain. 1 tablet for moderate pain or 2 tablets for severe pain   levothyroxine (SYNTHROID) 88 MCG tablet Take 88 mcg by mouth daily before breakfast.   lidocaine 4 % Place 1 patch onto the skin in the morning and at bedtime.   melatonin 5 MG TABS Take 5 mg by mouth at bedtime as needed (sleep).   memantine (NAMENDA) 5 MG tablet Take 5 mg by mouth 2 (two) times daily.   mirabegron ER (MYRBETRIQ) 25 MG TB24 tablet Take 1 tablet (25 mg total) by mouth daily.   Multiple Vitamins-Minerals (PRESERVISION AREDS PO) Take 1 tablet by mouth 2 (two) times daily.    omeprazole (PRILOSEC) 40 MG capsule Take 40 mg by mouth daily.   polyethylene glycol (MIRALAX) 17 g packet Take 17 g by mouth daily.   propranolol (INDERAL) 20 MG tablet Take 20 mg by mouth 2 (two) times daily. 1 tablet (20 mg) oral, Twice A Day   rosuvastatin (CRESTOR) 5 MG tablet Take 1 tablet (5 mg total) by mouth daily.   gabapentin (NEURONTIN) 100  MG capsule Take 200 mg by mouth 3 (three) times daily.   No facility-administered encounter medications on file as of 12/27/2022.    Review of Systems  Unable to perform ROS: Dementia    Immunization History  Administered Date(s) Administered   Fluad Quad(high Dose 65+) 07/02/2022   Influenza, High Dose Seasonal PF 07/23/2017, 07/03/2019   Influenza-Unspecified 05/27/2010, 08/03/2010, 07/22/2011, 07/01/2012, 06/22/2013, 07/18/2020, 07/11/2021, 07/02/2022   Janssen (J&J) SARS-COV-2 Vaccination 11/06/2019   Moderna SARS-COV2 Booster Vaccination 08/05/2021   Moderna Sars-Covid-2 Vaccination 08/07/2020   PFIZER(Purple Top)SARS-COV-2 Vaccination 10/09/2019, 08/07/2020   Pneumococcal Conjugate-13 02/08/2015   Pneumococcal Polysaccharide-23 05/28/2006, 10/13/2011   Pneumococcal-Unspecified 10/17/2013   Td 03/29/2002   Tdap 09/27/2001, 04/25/2002, 08/18/2012, 10/17/2013   Zoster Recombinat (Shingrix) 05/23/2021, 07/23/2021   Zoster, Live 08/30/2006   Zoster, Unspecified 08/30/2006   Pertinent  Health Maintenance Due  Topic Date Due   INFLUENZA VACCINE  04/28/2023   DEXA SCAN  Completed      08/07/2022   11:17 AM 08/07/2022   11:03 PM 08/08/2022    8:00 PM 08/09/2022    8:00 AM 08/17/2022   10:59 AM  Fall Risk  Falls in the past year?     1  Was there an injury with Fall?     1  Fall Risk Category Calculator     3  Fall Risk Category (Retired)     High  (RETIRED) Patient Fall Risk Level High fall risk High fall risk High fall risk High fall risk High fall risk  Patient at Risk for Falls Due to     History of fall(s);Impaired balance/gait;Impaired mobility  Fall risk Follow up     Falls evaluation completed   Functional Status Survey:    Vitals:   12/27/22 1541  BP: 138/81  Pulse: 94  Resp: 20  Temp: 98.1 F (36.7 C)  TempSrc: Temporal  SpO2: 92%  Weight: 152 lb 4.8 oz (69.1 kg)  Height: 5\' 1"  (1.549 m)   Body mass index is 28.78 kg/m. Physical Exam Vitals  reviewed.  Constitutional:  Appearance: Normal appearance.  HENT:     Head: Normocephalic.     Nose: Nose normal.     Mouth/Throat:     Mouth: Mucous membranes are moist.     Pharynx: Oropharynx is clear.  Eyes:     Pupils: Pupils are equal, round, and reactive to light.  Cardiovascular:     Rate and Rhythm: Normal rate and regular rhythm.     Pulses: Normal pulses.     Heart sounds: Normal heart sounds. No murmur heard. Pulmonary:     Effort: Pulmonary effort is normal.     Breath sounds: Normal breath sounds.  Abdominal:     General: Abdomen is flat. Bowel sounds are normal.     Palpations: Abdomen is soft.  Musculoskeletal:        General: No swelling.     Cervical back: Neck supple.     Comments: Right Wrist Swollen and Tender  Skin:    General: Skin is warm.  Neurological:     General: No focal deficit present.     Mental Status: She is alert.  Psychiatric:        Mood and Affect: Mood normal.        Thought Content: Thought content normal.     Labs reviewed: Recent Labs    08/07/22 1122 08/07/22 1615 08/07/22 2343 08/16/22 0000 08/16/22 1300 12/22/22 1850  NA 141  --    < > 142 142 138  K 4.2  --    < > 4.1 4.1 4.1  CL 104  --   --  104 104 103  CO2 29  --   --  23* 23* 25  GLUCOSE 99  --   --   --   --  108*  BUN 14  --   --  10 10 11   CREATININE 0.95  --   --  0.7 0.7 0.76  CALCIUM 9.2  --   --  9.3 9.3 8.6*  MG  --  2.2  --   --   --   --   PHOS  --  4.0  --   --   --   --    < > = values in this interval not displayed.   Recent Labs    08/07/22 1615 08/16/22 0000 08/16/22 1300 12/22/22 1850  AST 20 16 16 16   ALT 14 14 14 21   ALKPHOS 69 100 100 109  BILITOT 0.5  --   --  0.8  PROT 6.8  --   --  6.9  ALBUMIN 3.6 4.0 4.0 3.1*   Recent Labs    08/07/22 1122 08/07/22 2343 08/18/22 0000 12/22/22 1850  WBC 9.9  --  7.2 9.7  NEUTROABS 7.5  --   --   --   HGB 14.1 12.6 13.5 13.4  HCT 43.4 37.0 41 41.3  MCV 98.9  --   --  96.9  PLT  301  --  298 350   Lab Results  Component Value Date   TSH 1.147 08/07/2022   Lab Results  Component Value Date   HGBA1C 5.6 09/21/2017   Lab Results  Component Value Date   CHOL 174 03/23/2022   HDL 66 03/23/2022   LDLCALC 81 03/23/2022   TRIG 134 03/23/2022   CHOLHDL 2.1 09/21/2017    Significant Diagnostic Results in last 30 days:  CT HEAD WO CONTRAST  Result Date: 12/22/2022 CLINICAL DATA:  Head trauma, moderate-severe; Polytrauma, blunt EXAM: CT HEAD  WITHOUT CONTRAST CT CERVICAL SPINE WITHOUT CONTRAST TECHNIQUE: Multidetector CT imaging of the head and cervical spine was performed following the standard protocol without intravenous contrast. Multiplanar CT image reconstructions of the cervical spine were also generated. RADIATION DOSE REDUCTION: This exam was performed according to the departmental dose-optimization program which includes automated exposure control, adjustment of the mA and/or kV according to patient size and/or use of iterative reconstruction technique. COMPARISON:  CT head 08/07/2022, CT neck 10/29/2016 FINDINGS: CT HEAD FINDINGS Brain: Patchy and confluent areas of decreased attenuation are noted throughout the deep and periventricular white matter of the cerebral hemispheres bilaterally, compatible with chronic microvascular ischemic disease. No evidence of large-territorial acute infarction. No parenchymal hemorrhage. No mass lesion. No extra-axial collection. No mass effect or midline shift. No hydrocephalus. Basilar cisterns are patent. Vascular: No hyperdense vessel. Skull: No acute fracture or focal lesion. Sinuses/Orbits: Paranasal sinuses and mastoid air cells are clear. Bilateral lens replacement. Otherwise the orbits are unremarkable. Other: None. CT CERVICAL SPINE FINDINGS Alignment: Grade 1 anterolisthesis of T2 on T3. Skull base and vertebrae: C5 through C7 anterior cervical discectomy infusion. Multilevel moderate degenerative changes spine. No associated  severe osseous neural foraminal or central canal stenosis. No acute fracture. No aggressive appearing focal osseous lesion or focal pathologic process. Soft tissues and spinal canal: No prevertebral fluid or swelling. No visible canal hematoma. Upper chest: Unremarkable. Other: Aortic arch calcification. IMPRESSION: 1. No acute intracranial abnormality. 2. No acute displaced fracture or traumatic listhesis of the cervical spine. Electronically Signed   By: Iven Finn M.D.   On: 12/22/2022 20:20   CT CERVICAL SPINE WO CONTRAST  Result Date: 12/22/2022 CLINICAL DATA:  Head trauma, moderate-severe; Polytrauma, blunt EXAM: CT HEAD WITHOUT CONTRAST CT CERVICAL SPINE WITHOUT CONTRAST TECHNIQUE: Multidetector CT imaging of the head and cervical spine was performed following the standard protocol without intravenous contrast. Multiplanar CT image reconstructions of the cervical spine were also generated. RADIATION DOSE REDUCTION: This exam was performed according to the departmental dose-optimization program which includes automated exposure control, adjustment of the mA and/or kV according to patient size and/or use of iterative reconstruction technique. COMPARISON:  CT head 08/07/2022, CT neck 10/29/2016 FINDINGS: CT HEAD FINDINGS Brain: Patchy and confluent areas of decreased attenuation are noted throughout the deep and periventricular white matter of the cerebral hemispheres bilaterally, compatible with chronic microvascular ischemic disease. No evidence of large-territorial acute infarction. No parenchymal hemorrhage. No mass lesion. No extra-axial collection. No mass effect or midline shift. No hydrocephalus. Basilar cisterns are patent. Vascular: No hyperdense vessel. Skull: No acute fracture or focal lesion. Sinuses/Orbits: Paranasal sinuses and mastoid air cells are clear. Bilateral lens replacement. Otherwise the orbits are unremarkable. Other: None. CT CERVICAL SPINE FINDINGS Alignment: Grade 1  anterolisthesis of T2 on T3. Skull base and vertebrae: C5 through C7 anterior cervical discectomy infusion. Multilevel moderate degenerative changes spine. No associated severe osseous neural foraminal or central canal stenosis. No acute fracture. No aggressive appearing focal osseous lesion or focal pathologic process. Soft tissues and spinal canal: No prevertebral fluid or swelling. No visible canal hematoma. Upper chest: Unremarkable. Other: Aortic arch calcification. IMPRESSION: 1. No acute intracranial abnormality. 2. No acute displaced fracture or traumatic listhesis of the cervical spine. Electronically Signed   By: Iven Finn M.D.   On: 12/22/2022 20:20   DG Chest 2 View  Result Date: 12/22/2022 CLINICAL DATA:  Chest pain EXAM: CHEST - 2 VIEW COMPARISON:  08/07/2022 FINDINGS: Cardiac shadow is within normal limits. Lungs are  well aerated bilaterally. No focal infiltrate or effusion is seen. Postsurgical changes in the cervical spine are seen. Old rib fractures are noted on left involving the fourth and fifth ribs with callus formation. Acute sixth rib fracture is noted. Old rib fractures on the right are noted as well. IMPRESSION: Acute sixth rib fracture on the left. Bilateral chronic rib fractures are noted. Electronically Signed   By: Inez Catalina M.D.   On: 12/22/2022 19:49   DG Shoulder Left  Result Date: 12/22/2022 CLINICAL DATA:  Recent fall with left shoulder pain, initial encounter EXAM: LEFT SHOULDER - 2+ VIEW COMPARISON:  None Available. FINDINGS: Mild degenerative changes of the acromioclavicular and glenohumeral joints are seen. Multiple calcified loose bodies are noted in the inferior aspect of the joint. No acute fracture or dislocation is seen. Fracture of the left sixth rib is noted which is acute. Fractures of the fourth and fifth ribs on the left are noted with callus formation consistent with prior fracture and healing. IMPRESSION: Acute left sixth rib fracture without  complicating factors. Healing rib fractures involving the fourth and fifth ribs on the left. Degenerative changes of the shoulder joint. Electronically Signed   By: Inez Catalina M.D.   On: 12/22/2022 19:48   DG Lumbar Spine Complete  Result Date: 12/22/2022 CLINICAL DATA:  Recent fall with low back pain, initial encounter EXAM: LUMBAR SPINE - COMPLETE 4+ VIEW COMPARISON:  06/08/2021 FINDINGS: Five lumbar type vertebral bodies are well visualized. Vertebral body height is well maintained. Anterolisthesis of L4 on L5 and L5 on S1 is noted stable in appearance from the prior exam. Prior laminectomy at L4-5 is seen. Osteophytic changes are seen. Mild scoliosis of the thoracolumbar spine is noted. No pars defects are seen. Hypertrophic facet changes are noted. No soft tissue abnormality is noted. IMPRESSION: Multilevel degenerative change and postoperative change. No acute abnormality noted. Electronically Signed   By: Inez Catalina M.D.   On: 12/22/2022 19:46   DG Hip Unilat W or Wo Pelvis 2-3 Views Left  Result Date: 12/22/2022 CLINICAL DATA:  Fall several days ago with left hip pain, initial encounter EXAM: DG HIP (WITH OR WITHOUT PELVIS) 3V LEFT COMPARISON:  None Available. FINDINGS: Pelvic ring is intact. No acute fracture or dislocation is noted. No soft tissue abnormality is seen. IMPRESSION: No acute abnormality noted. Electronically Signed   By: Inez Catalina M.D.   On: 12/22/2022 19:44    Assessment/Plan  1. Pain and swelling of right wrist XRAY Ordered  2. Closed fracture of multiple ribs of left side, Pain Control Has Some cough but Exam negative Already on Keflex also  3. Left hip pain Went to see Ortho but told them that her Pain is resolved and they did not do any more Test  4. Neck pain CT scan negative  5. Vascular dementia without behavioral disturbance SNF Level care   Family/ staff Communication:   Labs/tests ordered:

## 2022-12-28 ENCOUNTER — Encounter: Payer: Self-pay | Admitting: Orthopedic Surgery

## 2022-12-28 ENCOUNTER — Non-Acute Institutional Stay (SKILLED_NURSING_FACILITY): Payer: Medicare Other | Admitting: Orthopedic Surgery

## 2022-12-28 ENCOUNTER — Ambulatory Visit (HOSPITAL_BASED_OUTPATIENT_CLINIC_OR_DEPARTMENT_OTHER)
Admission: RE | Admit: 2022-12-28 | Discharge: 2022-12-28 | Disposition: A | Discharge status: Home / Self Care | Payer: Medicare Other | Source: Ambulatory Visit | Attending: Orthopedic Surgery | Admitting: Orthopedic Surgery

## 2022-12-28 DIAGNOSIS — E2839 Other primary ovarian failure: Secondary | ICD-10-CM | POA: Diagnosis not present

## 2022-12-28 DIAGNOSIS — M25431 Effusion, right wrist: Secondary | ICD-10-CM

## 2022-12-28 DIAGNOSIS — R0602 Shortness of breath: Secondary | ICD-10-CM

## 2022-12-28 DIAGNOSIS — M25552 Pain in left hip: Secondary | ICD-10-CM | POA: Diagnosis not present

## 2022-12-28 DIAGNOSIS — M25531 Pain in right wrist: Secondary | ICD-10-CM | POA: Diagnosis not present

## 2022-12-28 DIAGNOSIS — R278 Other lack of coordination: Secondary | ICD-10-CM | POA: Diagnosis not present

## 2022-12-28 DIAGNOSIS — R293 Abnormal posture: Secondary | ICD-10-CM | POA: Diagnosis not present

## 2022-12-28 MED ORDER — PREDNISONE 20 MG PO TABS: 20.0000 mg | ORAL_TABLET | Freq: Every day | ORAL | 0 refills | Status: AC

## 2022-12-28 NOTE — Progress Notes (Signed)
Location:  [facility] Room Number: 146/A Place of Service:  SNF [phone]) Provider:  Yvonna Alanis, NP   Virgie Dad, MD  Patient Care Team: Virgie Dad, MD as PCP - General (Internal Medicine) Nahser, Wonda Cheng, MD as PCP - Cardiology (Cardiology) Clarene Essex, MD as Consulting Physician (Gastroenterology) Marcial Pacas, MD as Consulting Physician (Neurology) Kristeen Miss, MD as Consulting Physician (Neurosurgery) [place], Anita Mcadory, MD as Consulting Physician (Dermatology) Megan Salon, MD as Consulting Physician (Gynecology) [place], Heather, [place] (Optometry) Virgie Dad, MD (Internal Medicine)  Extended Emergency Contact Information Primary Emergency Contact: Cordone,Robert J Address: [address]          [place], [place] [address] [name] of [place] Phone: [phone] Work Phone: [phone] Mobile Phone: [phone] Relation: Spouse Secondary Emergency Contact: [place] Livings of [place] Phone: [phone] Mobile Phone: [phone] Relation: Son  Code Status:  DNR Goals of care: Advanced Directive information    12/27/2022    3:59 PM  Advanced Directives  Does Patient Have a Medical Advance Directive? Yes  Type of Paramedic of [place];Living will;Out of facility DNR (pink MOST or yellow form)  Does patient want to make changes to medical advance directive? No - Patient declined  Copy of [place] in Chart? Yes - validated most recent copy scanned in chart (See row information)  Pre-existing out of facility DNR order (yellow form or pink MOST form) Yellow form placed in chart (order not valid for inpatient use)     Chief Complaint  Patient presents with   Acute Visit    Right wrist pain    HPI:  Pt is a 82 y.o. female seen today for acute visit due to right wrist pain.   She currently resides on the skilled nursing unit at [facility] PMH: HTN, HLD, h/o  stroke> on Plavix, IBS, hypothyroidism, vascular dementia, OAB, lumbar stenosis, macular degeneration and anxiety.   03/21 mechanical fall in bathroom. She c/o left hip pain after event. 03/27 she was evaluated by OT and c/o excruciating neck pain. She was sent to ED for evaluation. Workup revealed left sided rib fracture (6th rib) and UTI. She was discharged back to [facility] with Keflex x 7 days.   03/28 she was evaluated by ortho, she denies pain and left hip injection was not performed.   Since fall, right wrist pain has increased. 04/01 xray of right wrist and forearm negative for acute fracture or dislocation. She is a poor historian due to vascular dementia. She is guarding right hand today. She states" I am scared to move right hand due to pain."   Nursing reports O2 sats 82-84% last night. Non productive cough and wheezing also reported. O2 sats improved after starting 2 liters oxygen. Covid test pending.    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 Street of back injections by Dr. Brien Few 2016 with good relief, history of cervical DD with possiblity of surgery by Dr. Trenton Gammon.   GERD (gastroesophageal reflux disease)    IBS with moderate refluc seen on upper GI study form January 2011 Dr. Watt Climes   HTN (hypertension)  no meds   Hyperlipidemia    Hypothyroidism    IBS (irritable bowel syndrome)    Imbalance    Impingement syndrome of right shoulder 01/05/2013   Mass of left side of neck    Memory loss    Mood disorder    MVP (mitral valve prolapse)    Hx of    Osteopenia    Peripheral edema    Right rotator cuff tear 01/05/2013   Sciatic pain    recurrent   Seasonal allergies    Stroke    Tension headache     TIA (transient ischemic attack)    Tremor    Past Surgical History:  Procedure Laterality Date   ANTERIOR CERVICAL DECOMP/DISCECTOMY FUSION  04/2015    Dr. Pamala Hurry, [place]  2004   which revealed smooth and normal coronary arteries   CARPAL TUNNEL RELEASE  2000   lt   CATARACT EXTRACTION W/ INTRAOCULAR LENS  IMPLANT, BILATERAL  1/17   Dr. Lucita Ferrara   COSMETIC SURGERY  2010   eyes-facial   DENTAL SURGERY  1960   LASIK     LOOP RECORDER INSERTION N/A 09/22/2017   Procedure: LOOP RECORDER INSERTION;  Surgeon: Constance Haw, MD;  Location: [facility];  Service: Cardiovascular;  Laterality: N/A;   LUMBAR LAMINECTOMY/DECOMPRESSION MICRODISCECTOMY N/A 06/08/2021   Procedure: Laminectomy and Foraminotomy - L4-L5, L5-S1;  Surgeon: Earnie Larsson, MD;  Location: [facility];  Service: Neurosurgery;  Laterality: N/A;   SHOULDER ARTHROSCOPY WITH ROTATOR CUFF REPAIR Right 01/05/2013   Procedure: SHOULDER ARTHROSCOPY WITH ARTHROSCOPIC  ROTATOR CUFF REPAIR, ACROMIOPLASTY, EXTENSIVE DEBRIDEMENT;  Surgeon: Johnny Bridge, MD;  Location: [facility];  Service: Orthopedics;  Laterality: Right;   TEE WITHOUT CARDIOVERSION N/A 09/22/2017   Procedure: TRANSESOPHAGEAL ECHOCARDIOGRAM (TEE);  Surgeon: Sanda Klein, MD;  Location: [facility];  Service: Cardiovascular;  Laterality: N/A;   TONSILLECTOMY     TUBAL LIGATION      Allergies  Allergen Reactions   Fluticasone Other (See Comments)    DRY EYES   Aciphex [Rabeprazole] Other (See Comments)    Critical    Codeine Nausea Only   Codeine Other (See Comments)    unknown   Cymbalta [Duloxetine Hcl] Other (See Comments)    Critical    Lexapro [Escitalopram] Other (See Comments)    critical   Lipitor [Atorvastatin Calcium] Other (See Comments)    Intolerant    Morphine Sulfate     Per matrix   Pneumovax [Pneumococcal Polysaccharide Vaccine] Other (See Comments)    moderate   Prevacid  [Lansoprazole] Other (See Comments)    Unknown, listed on MAR   Rosuvastatin Other (See Comments)    Intolerant    Sertraline Other (See Comments)    critical   Statins Other (See Comments)    Critical    Sulfa Antibiotics Nausea And Vomiting   Tetracyclines & Related Other (See Comments)    Morphine Sulfate   Welchol [Colesevelam] Other (See Comments)    Unknown, listed on MAR   Pneumovax 23 [Pneumococcal Vac Polyvalent] Rash    Injection site reaction    Outpatient Encounter Medications as of 12/28/2022  Medication Sig   acetaminophen (TYLENOL) 325 MG tablet Take 650 mg by mouth 2 (two) times daily as needed.   acetaminophen (TYLENOL) 325 MG tablet Take 650 mg by mouth 3 (three) times daily.   Calcium Carb-Cholecalciferol (CALCIUM 500 + D PO) Take 1 tablet by mouth daily.  calcium carbonate (TUMS - DOSED IN MG ELEMENTAL CALCIUM) 500 MG chewable tablet Chew 2 tablets by mouth 2 (two) times daily as needed for indigestion or heartburn.   cephALEXin (KEFLEX) 500 MG capsule Take 1 capsule (500 mg total) by mouth 2 (two) times daily for 7 days.   clonazePAM (KLONOPIN) 0.5 MG tablet Take 0.5 tablets (0.25 mg total) by mouth at bedtime.   clopidogrel (PLAVIX) 75 MG tablet Take 1 tablet (75 mg total) by mouth daily.   diclofenac Sodium (VOLTAREN) 1 % GEL Apply 2 g topically 2 (two) times daily as needed (pain).   donepezil (ARICEPT) 10 MG tablet Take 1 tablet (10 mg total) by mouth daily.   gabapentin (NEURONTIN) 100 MG capsule Take 200 mg by mouth 3 (three) times daily.   HYDROcodone-acetaminophen (NORCO/VICODIN) 5-325 MG tablet Take 1-2 tablets by mouth every 4 (four) hours as needed for moderate pain. 1 tablet for moderate pain or 2 tablets for severe pain   levothyroxine (SYNTHROID) 88 MCG tablet Take 88 mcg by mouth daily before breakfast.   lidocaine 4 % Place 1 patch onto the skin in the morning and at bedtime.   melatonin 5 MG TABS Take 5 mg by mouth at bedtime as needed (sleep).    memantine (NAMENDA) 5 MG tablet Take 5 mg by mouth 2 (two) times daily.   mirabegron ER (MYRBETRIQ) 25 MG TB24 tablet Take 1 tablet (25 mg total) by mouth daily.   Multiple Vitamins-Minerals (PRESERVISION AREDS PO) Take 1 tablet by mouth 2 (two) times daily.    omeprazole (PRILOSEC) 40 MG capsule Take 40 mg by mouth daily.   polyethylene glycol (MIRALAX) 17 g packet Take 17 g by mouth daily.   propranolol (INDERAL) 20 MG tablet Take 20 mg by mouth 2 (two) times daily. 1 tablet (20 mg) oral, Twice A Day   rosuvastatin (CRESTOR) 5 MG tablet Take 1 tablet (5 mg total) by mouth daily.   No facility-administered encounter medications on file as of 12/28/2022.    Review of Systems  Unable to perform ROS: Dementia    Immunization History  Administered Date(s) Administered   Fluad Quad(high Dose 65+) 07/02/2022   Influenza, High Dose Seasonal PF 07/23/2017, 07/03/2019   Influenza-Unspecified 05/27/2010, 08/03/2010, 07/22/2011, 07/01/2012, 06/22/2013, 07/18/2020, 07/11/2021, 07/02/2022   Janssen (J&J) SARS-COV-2 Vaccination 11/06/2019   Moderna SARS-COV2 Booster Vaccination 08/05/2021   Moderna Sars-Covid-2 Vaccination 08/07/2020   PFIZER(Purple Top)SARS-COV-2 Vaccination 10/09/2019, 08/07/2020   Pneumococcal Conjugate-13 02/08/2015   Pneumococcal Polysaccharide-23 05/28/2006, 10/13/2011   Pneumococcal-Unspecified 10/17/2013   Td 03/29/2002   Tdap 09/27/2001, 04/25/2002, 08/18/2012, 10/17/2013   Zoster Recombinat (Shingrix) 05/23/2021, 07/23/2021   Zoster, Live 08/30/2006   Zoster, Unspecified 08/30/2006   Pertinent  Health Maintenance Due  Topic Date Due   INFLUENZA VACCINE  04/28/2023   DEXA SCAN  Completed      08/07/2022   11:17 AM 08/07/2022   11:03 PM 08/08/2022    8:00 PM 08/09/2022    8:00 AM 08/17/2022   10:59 AM  Fall Risk  Falls in the past year?     1  Was there an injury with Fall?     1  Fall Risk Category Calculator     3  Fall Risk Category (Retired)     High   (RETIRED) Patient Fall Risk Level High fall risk High fall risk High fall risk High fall risk High fall risk  Patient at Risk for Falls Due to     History of fall(s);Impaired  balance/gait;Impaired mobility  Fall risk Follow up     Falls evaluation completed   Functional Status Survey:    Vitals:   12/28/22 1042  BP: 138/81  Pulse: 94  Resp: 20  Temp: 98.1 F (36.7 C)  SpO2: 92%  Weight: 153 lb 6.4 oz (69.6 kg)  Height: 5\' 1"  (1.549 m)   Body mass index is 28.98 kg/m. Physical Exam Vitals reviewed.  Constitutional:      General: She is not in acute distress. HENT:     Head: Normocephalic.  Eyes:     General:        Right eye: No discharge.        Left eye: No discharge.  Cardiovascular:     Rate and Rhythm: Normal rate and regular rhythm.     Pulses: Normal pulses.     Heart sounds: Normal heart sounds.  Pulmonary:     Effort: Pulmonary effort is normal. No respiratory distress.     Breath sounds: Normal breath sounds. No wheezing.  Abdominal:     General: Bowel sounds are normal. There is no distension.     Tenderness: There is no abdominal tenderness.  Musculoskeletal:     Right wrist: Swelling, tenderness and snuff box tenderness present. Decreased range of motion.     Cervical back: Neck supple.     Right lower leg: No edema.     Left lower leg: No edema.  Skin:    General: Skin is warm and dry.     Capillary Refill: Capillary refill takes less than 2 seconds.     Comments: No erythema or skin breakdown to right wrist/hand  Neurological:     General: No focal deficit present.     Mental Status: She is alert. Mental status is at baseline.     Motor: Weakness present.     Gait: Gait abnormal.     Comments: wheelchair  Psychiatric:        Mood and Affect: Mood normal.     Labs reviewed: Recent Labs    08/07/22 1122 08/07/22 1615 08/07/22 2343 08/16/22 0000 08/16/22 1300 12/22/22 1850  NA 141  --    < > 142 142 138  K 4.2  --    < > 4.1 4.1 4.1   CL 104  --   --  104 104 103  CO2 29  --   --  23* 23* 25  GLUCOSE 99  --   --   --   --  108*  BUN 14  --   --  10 10 11   CREATININE 0.95  --   --  0.7 0.7 0.76  CALCIUM 9.2  --   --  9.3 9.3 8.6*  MG  --  2.2  --   --   --   --   PHOS  --  4.0  --   --   --   --    < > = values in this interval not displayed.   Recent Labs    08/07/22 1615 08/16/22 0000 08/16/22 1300 12/22/22 1850  AST 20 16 16 16   ALT 14 14 14 21   ALKPHOS 69 100 100 109  BILITOT 0.5  --   --  0.8  PROT 6.8  --   --  6.9  ALBUMIN 3.6 4.0 4.0 3.1*   Recent Labs    08/07/22 1122 08/07/22 2343 08/18/22 0000 12/22/22 1850  WBC 9.9  --  7.2 9.7  NEUTROABS 7.5  --   --   --  HGB 14.1 12.6 13.5 13.4  HCT 43.4 37.0 41 41.3  MCV 98.9  --   --  96.9  PLT 301  --  298 350   Lab Results  Component Value Date   TSH 1.147 08/07/2022   Lab Results  Component Value Date   HGBA1C 5.6 09/21/2017   Lab Results  Component Value Date   CHOL 174 03/23/2022   HDL 66 03/23/2022   LDLCALC 81 03/23/2022   TRIG 134 03/23/2022   CHOLHDL 2.1 09/21/2017    Significant Diagnostic Results in last 30 days:  CT HEAD WO CONTRAST  Result Date: 12/22/2022 CLINICAL DATA:  Head trauma, moderate-severe; Polytrauma, blunt EXAM: CT HEAD WITHOUT CONTRAST CT CERVICAL SPINE WITHOUT CONTRAST TECHNIQUE: Multidetector CT imaging of the head and cervical spine was performed following the standard protocol without intravenous contrast. Multiplanar CT image reconstructions of the cervical spine were also generated. RADIATION DOSE REDUCTION: This exam was performed according to the departmental dose-optimization program which includes automated exposure control, adjustment of the mA and/or kV according to patient size and/or use of iterative reconstruction technique. COMPARISON:  CT head 08/07/2022, CT neck 10/29/2016 FINDINGS: CT HEAD FINDINGS Brain: Patchy and confluent areas of decreased attenuation are noted throughout the deep and  periventricular white matter of the cerebral hemispheres bilaterally, compatible with chronic microvascular ischemic disease. No evidence of large-territorial acute infarction. No parenchymal hemorrhage. No mass lesion. No extra-axial collection. No mass effect or midline shift. No hydrocephalus. Basilar cisterns are patent. Vascular: No hyperdense vessel. Skull: No acute fracture or focal lesion. Sinuses/Orbits: Paranasal sinuses and mastoid air cells are clear. Bilateral lens replacement. Otherwise the orbits are unremarkable. Other: None. CT CERVICAL SPINE FINDINGS Alignment: Grade 1 anterolisthesis of T2 on T3. Skull base and vertebrae: C5 through C7 anterior cervical discectomy infusion. Multilevel moderate degenerative changes spine. No associated severe osseous neural foraminal or central canal stenosis. No acute fracture. No aggressive appearing focal osseous lesion or focal pathologic process. Soft tissues and spinal canal: No prevertebral fluid or swelling. No visible canal hematoma. Upper chest: Unremarkable. Other: Aortic arch calcification. IMPRESSION: 1. No acute intracranial abnormality. 2. No acute displaced fracture or traumatic listhesis of the cervical spine. Electronically Signed   By: Iven Finn M.D.   On: 12/22/2022 20:20   CT CERVICAL SPINE WO CONTRAST  Result Date: 12/22/2022 CLINICAL DATA:  Head trauma, moderate-severe; Polytrauma, blunt EXAM: CT HEAD WITHOUT CONTRAST CT CERVICAL SPINE WITHOUT CONTRAST TECHNIQUE: Multidetector CT imaging of the head and cervical spine was performed following the standard protocol without intravenous contrast. Multiplanar CT image reconstructions of the cervical spine were also generated. RADIATION DOSE REDUCTION: This exam was performed according to the departmental dose-optimization program which includes automated exposure control, adjustment of the mA and/or kV according to patient size and/or use of iterative reconstruction technique.  COMPARISON:  CT head 08/07/2022, CT neck 10/29/2016 FINDINGS: CT HEAD FINDINGS Brain: Patchy and confluent areas of decreased attenuation are noted throughout the deep and periventricular white matter of the cerebral hemispheres bilaterally, compatible with chronic microvascular ischemic disease. No evidence of large-territorial acute infarction. No parenchymal hemorrhage. No mass lesion. No extra-axial collection. No mass effect or midline shift. No hydrocephalus. Basilar cisterns are patent. Vascular: No hyperdense vessel. Skull: No acute fracture or focal lesion. Sinuses/Orbits: Paranasal sinuses and mastoid air cells are clear. Bilateral lens replacement. Otherwise the orbits are unremarkable. Other: None. CT CERVICAL SPINE FINDINGS Alignment: Grade 1 anterolisthesis of T2 on T3. Skull base and vertebrae: C5 through  C7 anterior cervical discectomy infusion. Multilevel moderate degenerative changes spine. No associated severe osseous neural foraminal or central canal stenosis. No acute fracture. No aggressive appearing focal osseous lesion or focal pathologic process. Soft tissues and spinal canal: No prevertebral fluid or swelling. No visible canal hematoma. Upper chest: Unremarkable. Other: Aortic arch calcification. IMPRESSION: 1. No acute intracranial abnormality. 2. No acute displaced fracture or traumatic listhesis of the cervical spine. Electronically Signed   By: Iven Finn M.D.   On: 12/22/2022 20:20   DG Chest 2 View  Result Date: 12/22/2022 CLINICAL DATA:  Chest pain EXAM: CHEST - 2 VIEW COMPARISON:  08/07/2022 FINDINGS: Cardiac shadow is within normal limits. Lungs are well aerated bilaterally. No focal infiltrate or effusion is seen. Postsurgical changes in the cervical spine are seen. Old rib fractures are noted on left involving the fourth and fifth ribs with callus formation. Acute sixth rib fracture is noted. Old rib fractures on the right are noted as well. IMPRESSION: Acute sixth rib  fracture on the left. Bilateral chronic rib fractures are noted. Electronically Signed   By: Inez Catalina M.D.   On: 12/22/2022 19:49   DG Shoulder Left  Result Date: 12/22/2022 CLINICAL DATA:  Recent fall with left shoulder pain, initial encounter EXAM: LEFT SHOULDER - 2+ VIEW COMPARISON:  None Available. FINDINGS: Mild degenerative changes of the acromioclavicular and glenohumeral joints are seen. Multiple calcified loose bodies are noted in the inferior aspect of the joint. No acute fracture or dislocation is seen. Fracture of the left sixth rib is noted which is acute. Fractures of the fourth and fifth ribs on the left are noted with callus formation consistent with prior fracture and healing. IMPRESSION: Acute left sixth rib fracture without complicating factors. Healing rib fractures involving the fourth and fifth ribs on the left. Degenerative changes of the shoulder joint. Electronically Signed   By: Inez Catalina M.D.   On: 12/22/2022 19:48   DG Lumbar Spine Complete  Result Date: 12/22/2022 CLINICAL DATA:  Recent fall with low back pain, initial encounter EXAM: LUMBAR SPINE - COMPLETE 4+ VIEW COMPARISON:  06/08/2021 FINDINGS: Five lumbar type vertebral bodies are well visualized. Vertebral body height is well maintained. Anterolisthesis of L4 on L5 and L5 on S1 is noted stable in appearance from the prior exam. Prior laminectomy at L4-5 is seen. Osteophytic changes are seen. Mild scoliosis of the thoracolumbar spine is noted. No pars defects are seen. Hypertrophic facet changes are noted. No soft tissue abnormality is noted. IMPRESSION: Multilevel degenerative change and postoperative change. No acute abnormality noted. Electronically Signed   By: Inez Catalina M.D.   On: 12/22/2022 19:46   DG Hip Unilat W or Wo Pelvis 2-3 Views Left  Result Date: 12/22/2022 CLINICAL DATA:  Fall several days ago with left hip pain, initial encounter EXAM: DG HIP (WITH OR WITHOUT PELVIS) 3V LEFT COMPARISON:  None  Available. FINDINGS: Pelvic ring is intact. No acute fracture or dislocation is noted. No soft tissue abnormality is seen. IMPRESSION: No acute abnormality noted. Electronically Signed   By: Inez Catalina M.D.   On: 12/22/2022 19:44    Assessment/Plan 1. Pain and swelling of right wrist - 03/21 mechanical fall - 04/01 right wrist xray negative for acute fracture or dislocation - right wrist swollen, limited ROM, guarding, snuff box tenderness - repeat xray to r/o fracture> concerns for scaphoid fracture  - start prednisone and ice applications prn for swelling - OT evaluation for splinting - DG Wrist Complete  Right; Future - predniSONE (DELTASONE) 20 MG tablet; Take 1 tablet (20 mg total) by mouth daily with breakfast for 7 days.  Dispense: 7 tablet; Refill: 0  2. Shortness of breath - nursing reports O2 sats 82-84% last night - 03/27 CXR left sided rib fracture (6th rib)  - O2 sat 95% on room air - lung sounds clear - suspect sleep apnea - continue routine vitals    Family/ staff Communication: plan discussed with patient and nurse  Labs/tests ordered:  Xray right wrist at [facility]

## 2022-12-29 ENCOUNTER — Non-Acute Institutional Stay (SKILLED_NURSING_FACILITY): Payer: Medicare Other | Admitting: Internal Medicine

## 2022-12-29 ENCOUNTER — Encounter: Payer: Self-pay | Admitting: Internal Medicine

## 2022-12-29 DIAGNOSIS — R293 Abnormal posture: Secondary | ICD-10-CM | POA: Diagnosis not present

## 2022-12-29 DIAGNOSIS — R0902 Hypoxemia: Secondary | ICD-10-CM | POA: Diagnosis not present

## 2022-12-29 DIAGNOSIS — M25431 Effusion, right wrist: Secondary | ICD-10-CM | POA: Diagnosis not present

## 2022-12-29 DIAGNOSIS — S2242XS Multiple fractures of ribs, left side, sequela: Secondary | ICD-10-CM | POA: Diagnosis not present

## 2022-12-29 DIAGNOSIS — M25531 Pain in right wrist: Secondary | ICD-10-CM

## 2022-12-29 DIAGNOSIS — S2232XS Fracture of one rib, left side, sequela: Secondary | ICD-10-CM

## 2022-12-29 DIAGNOSIS — M25552 Pain in left hip: Secondary | ICD-10-CM | POA: Diagnosis not present

## 2022-12-29 DIAGNOSIS — R278 Other lack of coordination: Secondary | ICD-10-CM | POA: Diagnosis not present

## 2022-12-29 NOTE — Progress Notes (Unsigned)
Location:  [facility] Room Number: 146A Place of Service:  SNF [phone]) Provider:  Virgie Dad, MD   Virgie Dad, MD  Patient Care Team: Virgie Dad, MD as PCP - General (Internal Medicine) Nahser, Wonda Cheng, MD as PCP - Cardiology (Cardiology) Clarene Essex, MD as Consulting Physician (Gastroenterology) Marcial Pacas, MD as Consulting Physician (Neurology) Kristeen Miss, MD as Consulting Physician (Neurosurgery) Martinique, Amy, MD as Consulting Physician (Dermatology) Megan Salon, MD as Consulting Physician (Gynecology) [place], Heather, [place] (Optometry) Virgie Dad, MD (Internal Medicine)  Extended Emergency Contact Information Primary Emergency Contact: Iams,Robert J Address: [address]          [place], [place] [address] [name] of [place] Phone: [phone] Work Phone: [phone] Mobile Phone: [phone] Relation: Spouse Secondary Emergency Contact: Quitman Livings of [place] Phone: [phone] Mobile Phone: [phone] Relation: Son  Code Status:  DNR Goals of care: Advanced Directive information    12/29/2022   11:34 AM  Advanced Directives  Does Patient Have a Medical Advance Directive? Yes  Type of Paramedic of [place];Living will;Out of facility DNR (pink MOST or yellow form)  Does patient want to make changes to medical advance directive? No - Patient declined  Copy of [place] in Chart? Yes - validated most recent copy scanned in chart (See row information)  Pre-existing out of facility DNR order (yellow form or pink MOST form) Yellow form placed in chart (order not valid for inpatient use)     Chief Complaint  Patient presents with   Acute Visit    Hypoxia   Quality Metric Gaps    Discussed the need for AWV    HPI:  Pt is a 82 y.o. female seen today for an acute visit for Hypoxia   Lives in SNF in [place]    She fell in the  bathroom few days ago when she went there by herself After few days was send to ED due to severe pain in her neck CT scan of her Neck and Head did not show anything Acute Her Chest Xray showed 6th rib Fracture Also diagnosed with UTI and got Keflex for 7 days She was send back to [facility]   OT decided that she needs Neck brace for her Neck pain But patient now has Cough and Hypoxia  No Chest pain or Fever She also had swelling in her Right wrist Xray here in [facility] of her wrist  did not show anything so we are repeating it in [facility] She was started oN Prednisone to help with her right wrist swelling  Patient unable to give much history Covid negative   Past Medical History:  Diagnosis Date   Anxiety    Arthritis    Biceps tendonitis 01/05/2013   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 Street of back injections by Dr. Brien Few 2016 with good relief, history of cervical DD with possiblity of surgery by Dr. Trenton Gammon.   GERD (gastroesophageal reflux disease)    IBS with moderate refluc seen on upper GI study form January 2011 Dr. Watt Climes   HTN (hypertension)    no meds   Hyperlipidemia    Hypothyroidism    IBS (irritable bowel syndrome)    Imbalance  Impingement syndrome of right shoulder 01/05/2013   Mass of left side of neck    Memory loss    Mood disorder    MVP (mitral valve prolapse)    Hx of    Osteopenia    Peripheral edema    Right rotator cuff tear 01/05/2013   Sciatic pain    recurrent   Seasonal allergies    Stroke    Tension headache    TIA (transient ischemic attack)    Tremor    Past Surgical History:  Procedure Laterality Date   ANTERIOR CERVICAL DECOMP/DISCECTOMY FUSION  04/2015    Dr. Pamala Hurry, [place]  2004   which revealed smooth and normal coronary arteries    CARPAL TUNNEL RELEASE  2000   lt   CATARACT EXTRACTION W/ INTRAOCULAR LENS  IMPLANT, BILATERAL  1/17   Dr. Lucita Ferrara   COSMETIC SURGERY  2010   eyes-facial   DENTAL SURGERY  1960   LASIK     LOOP RECORDER INSERTION N/A 09/22/2017   Procedure: LOOP RECORDER INSERTION;  Surgeon: Constance Haw, MD;  Location: [facility];  Service: Cardiovascular;  Laterality: N/A;   LUMBAR LAMINECTOMY/DECOMPRESSION MICRODISCECTOMY N/A 06/08/2021   Procedure: Laminectomy and Foraminotomy - L4-L5, L5-S1;  Surgeon: Earnie Larsson, MD;  Location: [facility];  Service: Neurosurgery;  Laterality: N/A;   SHOULDER ARTHROSCOPY WITH ROTATOR CUFF REPAIR Right 01/05/2013   Procedure: SHOULDER ARTHROSCOPY WITH ARTHROSCOPIC  ROTATOR CUFF REPAIR, ACROMIOPLASTY, EXTENSIVE DEBRIDEMENT;  Surgeon: Johnny Bridge, MD;  Location: [facility];  Service: Orthopedics;  Laterality: Right;   TEE WITHOUT CARDIOVERSION N/A 09/22/2017   Procedure: TRANSESOPHAGEAL ECHOCARDIOGRAM (TEE);  Surgeon: Sanda Klein, MD;  Location: [facility];  Service: Cardiovascular;  Laterality: N/A;   TONSILLECTOMY     TUBAL LIGATION      Allergies  Allergen Reactions   Fluticasone Other (See Comments)    DRY EYES   Aciphex [Rabeprazole] Other (See Comments)    Critical    Codeine Nausea Only   Codeine Other (See Comments)    unknown   Cymbalta [Duloxetine Hcl] Other (See Comments)    Critical    Lexapro [Escitalopram] Other (See Comments)    critical   Lipitor [Atorvastatin Calcium] Other (See Comments)    Intolerant    Morphine Sulfate     Per matrix   Pneumovax [Pneumococcal Polysaccharide Vaccine] Other (See Comments)    moderate   Prevacid [Lansoprazole] Other (See Comments)    Unknown, listed on MAR   Rosuvastatin Other (See Comments)    Intolerant    Sertraline Other (See Comments)    critical   Statins Other (See Comments)    Critical    Sulfa Antibiotics Nausea And Vomiting   Tetracyclines & Related  Other (See Comments)    Morphine Sulfate   Welchol [Colesevelam] Other (See Comments)    Unknown, listed on MAR   Pneumovax 23 [Pneumococcal Vac Polyvalent] Rash    Injection site reaction    Outpatient Encounter Medications as of 12/29/2022  Medication Sig   acetaminophen (TYLENOL) 325 MG tablet Take 650 mg by mouth 2 (two) times daily as needed.   acetaminophen (TYLENOL) 325 MG tablet Take 650 mg by mouth 3 (three) times daily.   Calcium Carb-Cholecalciferol (CALCIUM 500 + D PO) Take 1 tablet by mouth daily.   calcium carbonate (TUMS - DOSED IN MG ELEMENTAL CALCIUM) 500 MG chewable tablet Chew 2 tablets by mouth 2 (two) times daily as  needed for indigestion or heartburn.   clonazePAM (KLONOPIN) 0.5 MG tablet Take 0.5 tablets (0.25 mg total) by mouth at bedtime.   clopidogrel (PLAVIX) 75 MG tablet Take 1 tablet (75 mg total) by mouth daily.   diclofenac Sodium (VOLTAREN) 1 % GEL Apply 2 g topically 2 (two) times daily as needed (pain).   donepezil (ARICEPT) 10 MG tablet Take 1 tablet (10 mg total) by mouth daily.   gabapentin (NEURONTIN) 100 MG capsule Take 200 mg by mouth 3 (three) times daily.   HYDROcodone-acetaminophen (NORCO/VICODIN) 5-325 MG tablet Take 1-2 tablets by mouth every 4 (four) hours as needed for moderate pain. 1 tablet for moderate pain or 2 tablets for severe pain   levothyroxine (SYNTHROID) 88 MCG tablet Take 88 mcg by mouth daily before breakfast.   lidocaine 4 % Place 1 patch onto the skin in the morning and at bedtime.   melatonin 5 MG TABS Take 5 mg by mouth at bedtime as needed (sleep).   memantine (NAMENDA) 5 MG tablet Take 5 mg by mouth 2 (two) times daily.   mirabegron ER (MYRBETRIQ) 25 MG TB24 tablet Take 1 tablet (25 mg total) by mouth daily.   Multiple Vitamins-Minerals (PRESERVISION AREDS PO) Take 1 tablet by mouth 2 (two) times daily.    omeprazole (PRILOSEC) 40 MG capsule Take 40 mg by mouth daily.   polyethylene glycol (MIRALAX) 17 g packet Take 17 g by  mouth daily.   predniSONE (DELTASONE) 20 MG tablet Take 1 tablet (20 mg total) by mouth daily with breakfast for 7 days.   propranolol (INDERAL) 20 MG tablet Take 20 mg by mouth 2 (two) times daily. 1 tablet (20 mg) oral, Twice A Day   rosuvastatin (CRESTOR) 5 MG tablet Take 1 tablet (5 mg total) by mouth daily.   cephALEXin (KEFLEX) 500 MG capsule Take 1 capsule (500 mg total) by mouth 2 (two) times daily for 7 days. (Patient not taking: Reported on 12/29/2022)   No facility-administered encounter medications on file as of 12/29/2022.    Review of Systems  Unable to perform ROS: Dementia    Immunization History  Administered Date(s) Administered   Fluad Quad(high Dose 65+) 07/02/2022   Influenza, High Dose Seasonal PF 07/23/2017, 07/03/2019   Influenza-Unspecified 05/27/2010, 08/03/2010, 07/22/2011, 07/01/2012, 06/22/2013, 07/18/2020, 07/11/2021, 07/02/2022   Janssen (J&J) SARS-COV-2 Vaccination 11/06/2019   Moderna SARS-COV2 Booster Vaccination 08/05/2021   Moderna Sars-Covid-2 Vaccination 08/07/2020   PFIZER(Purple Top)SARS-COV-2 Vaccination 10/09/2019, 08/07/2020   Pneumococcal Conjugate-13 02/08/2015   Pneumococcal Polysaccharide-23 05/28/2006, 10/13/2011   Pneumococcal-Unspecified 10/17/2013   Td 03/29/2002   Tdap 09/27/2001, 04/25/2002, 08/18/2012, 10/17/2013   Zoster Recombinat (Shingrix) 05/23/2021, 07/23/2021   Zoster, Live 08/30/2006   Zoster, Unspecified 08/30/2006   Pertinent  Health Maintenance Due  Topic Date Due   INFLUENZA VACCINE  04/28/2023   DEXA SCAN  Completed      08/07/2022   11:17 AM 08/07/2022   11:03 PM 08/08/2022    8:00 PM 08/09/2022    8:00 AM 08/17/2022   10:59 AM  Fall Risk  Falls in the past year?     1  Was there an injury with Fall?     1  Fall Risk Category Calculator     3  Fall Risk Category (Retired)     High  (RETIRED) Patient Fall Risk Level High fall risk High fall risk High fall risk High fall risk High fall risk  Patient at Risk  for Falls Due to  History of fall(s);Impaired balance/gait;Impaired mobility  Fall risk Follow up     Falls evaluation completed   Functional Status Survey:    Vitals:   12/29/22 1121  BP: 101/65  Pulse: 81  Resp: 17  Temp: 98.1 F (36.7 C)  TempSrc: Temporal  SpO2: 92%  Weight: 153 lb 6.4 oz (69.6 kg)  Height: 5\' 1"  (1.549 m)   Body mass index is 28.98 kg/m. Physical Exam Vitals reviewed.  Constitutional:      Appearance: Normal appearance.  HENT:     Head: Normocephalic.     Nose: Nose normal.     Mouth/Throat:     Mouth: Mucous membranes are moist.     Pharynx: Oropharynx is clear.  Eyes:     Pupils: Pupils are equal, round, and reactive to light.  Cardiovascular:     Rate and Rhythm: Normal rate and regular rhythm.     Pulses: Normal pulses.     Heart sounds: Normal heart sounds. No murmur heard. Pulmonary:     Effort: Pulmonary effort is normal.     Breath sounds: Rales present.  Abdominal:     General: Abdomen is flat. Bowel sounds are normal.     Palpations: Abdomen is soft.  Musculoskeletal:        General: No swelling.     Cervical back: Neck supple.     Comments: Right Wrist in Brace  Skin:    General: Skin is warm.  Neurological:     General: No focal deficit present.     Mental Status: She is alert.  Psychiatric:        Mood and Affect: Mood normal.        Thought Content: Thought content normal.     Labs reviewed: Recent Labs    08/07/22 1122 08/07/22 1615 08/07/22 2343 08/16/22 0000 08/16/22 1300 12/22/22 1850  NA 141  --    < > 142 142 138  K 4.2  --    < > 4.1 4.1 4.1  CL 104  --   --  104 104 103  CO2 29  --   --  23* 23* 25  GLUCOSE 99  --   --   --   --  108*  BUN 14  --   --  10 10 11   CREATININE 0.95  --   --  0.7 0.7 0.76  CALCIUM 9.2  --   --  9.3 9.3 8.6*  MG  --  2.2  --   --   --   --   PHOS  --  4.0  --   --   --   --    < > = values in this interval not displayed.   Recent Labs    08/07/22 1615  08/16/22 0000 08/16/22 1300 12/22/22 1850  AST 20 16 16 16   ALT 14 14 14 21   ALKPHOS 69 100 100 109  BILITOT 0.5  --   --  0.8  PROT 6.8  --   --  6.9  ALBUMIN 3.6 4.0 4.0 3.1*   Recent Labs    08/07/22 1122 08/07/22 2343 08/18/22 0000 12/22/22 1850  WBC 9.9  --  7.2 9.7  NEUTROABS 7.5  --   --   --   HGB 14.1 12.6 13.5 13.4  HCT 43.4 37.0 41 41.3  MCV 98.9  --   --  96.9  PLT 301  --  298 350   Lab Results  Component Value  Date   TSH 1.147 08/07/2022   Lab Results  Component Value Date   HGBA1C 5.6 09/21/2017   Lab Results  Component Value Date   CHOL 174 03/23/2022   HDL 66 03/23/2022   LDLCALC 81 03/23/2022   TRIG 134 03/23/2022   CHOLHDL 2.1 09/21/2017    Significant Diagnostic Results in last 30 days:  CT HEAD WO CONTRAST  Result Date: 12/22/2022 CLINICAL DATA:  Head trauma, moderate-severe; Polytrauma, blunt EXAM: CT HEAD WITHOUT CONTRAST CT CERVICAL SPINE WITHOUT CONTRAST TECHNIQUE: Multidetector CT imaging of the head and cervical spine was performed following the standard protocol without intravenous contrast. Multiplanar CT image reconstructions of the cervical spine were also generated. RADIATION DOSE REDUCTION: This exam was performed according to the departmental dose-optimization program which includes automated exposure control, adjustment of the mA and/or kV according to patient size and/or use of iterative reconstruction technique. COMPARISON:  CT head 08/07/2022, CT neck 10/29/2016 FINDINGS: CT HEAD FINDINGS Brain: Patchy and confluent areas of decreased attenuation are noted throughout the deep and periventricular white matter of the cerebral hemispheres bilaterally, compatible with chronic microvascular ischemic disease. No evidence of large-territorial acute infarction. No parenchymal hemorrhage. No mass lesion. No extra-axial collection. No mass effect or midline shift. No hydrocephalus. Basilar cisterns are patent. Vascular: No hyperdense vessel.  Skull: No acute fracture or focal lesion. Sinuses/Orbits: Paranasal sinuses and mastoid air cells are clear. Bilateral lens replacement. Otherwise the orbits are unremarkable. Other: None. CT CERVICAL SPINE FINDINGS Alignment: Grade 1 anterolisthesis of T2 on T3. Skull base and vertebrae: C5 through C7 anterior cervical discectomy infusion. Multilevel moderate degenerative changes spine. No associated severe osseous neural foraminal or central canal stenosis. No acute fracture. No aggressive appearing focal osseous lesion or focal pathologic process. Soft tissues and spinal canal: No prevertebral fluid or swelling. No visible canal hematoma. Upper chest: Unremarkable. Other: Aortic arch calcification. IMPRESSION: 1. No acute intracranial abnormality. 2. No acute displaced fracture or traumatic listhesis of the cervical spine. Electronically Signed   By: Iven Finn M.D.   On: 12/22/2022 20:20   CT CERVICAL SPINE WO CONTRAST  Result Date: 12/22/2022 CLINICAL DATA:  Head trauma, moderate-severe; Polytrauma, blunt EXAM: CT HEAD WITHOUT CONTRAST CT CERVICAL SPINE WITHOUT CONTRAST TECHNIQUE: Multidetector CT imaging of the head and cervical spine was performed following the standard protocol without intravenous contrast. Multiplanar CT image reconstructions of the cervical spine were also generated. RADIATION DOSE REDUCTION: This exam was performed according to the departmental dose-optimization program which includes automated exposure control, adjustment of the mA and/or kV according to patient size and/or use of iterative reconstruction technique. COMPARISON:  CT head 08/07/2022, CT neck 10/29/2016 FINDINGS: CT HEAD FINDINGS Brain: Patchy and confluent areas of decreased attenuation are noted throughout the deep and periventricular white matter of the cerebral hemispheres bilaterally, compatible with chronic microvascular ischemic disease. No evidence of large-territorial acute infarction. No parenchymal  hemorrhage. No mass lesion. No extra-axial collection. No mass effect or midline shift. No hydrocephalus. Basilar cisterns are patent. Vascular: No hyperdense vessel. Skull: No acute fracture or focal lesion. Sinuses/Orbits: Paranasal sinuses and mastoid air cells are clear. Bilateral lens replacement. Otherwise the orbits are unremarkable. Other: None. CT CERVICAL SPINE FINDINGS Alignment: Grade 1 anterolisthesis of T2 on T3. Skull base and vertebrae: C5 through C7 anterior cervical discectomy infusion. Multilevel moderate degenerative changes spine. No associated severe osseous neural foraminal or central canal stenosis. No acute fracture. No aggressive appearing focal osseous lesion or focal pathologic process. Soft tissues and  spinal canal: No prevertebral fluid or swelling. No visible canal hematoma. Upper chest: Unremarkable. Other: Aortic arch calcification. IMPRESSION: 1. No acute intracranial abnormality. 2. No acute displaced fracture or traumatic listhesis of the cervical spine. Electronically Signed   By: Iven Finn M.D.   On: 12/22/2022 20:20   DG Chest 2 View  Result Date: 12/22/2022 CLINICAL DATA:  Chest pain EXAM: CHEST - 2 VIEW COMPARISON:  08/07/2022 FINDINGS: Cardiac shadow is within normal limits. Lungs are well aerated bilaterally. No focal infiltrate or effusion is seen. Postsurgical changes in the cervical spine are seen. Old rib fractures are noted on left involving the fourth and fifth ribs with callus formation. Acute sixth rib fracture is noted. Old rib fractures on the right are noted as well. IMPRESSION: Acute sixth rib fracture on the left. Bilateral chronic rib fractures are noted. Electronically Signed   By: Inez Catalina M.D.   On: 12/22/2022 19:49   DG Shoulder Left  Result Date: 12/22/2022 CLINICAL DATA:  Recent fall with left shoulder pain, initial encounter EXAM: LEFT SHOULDER - 2+ VIEW COMPARISON:  None Available. FINDINGS: Mild degenerative changes of the  acromioclavicular and glenohumeral joints are seen. Multiple calcified loose bodies are noted in the inferior aspect of the joint. No acute fracture or dislocation is seen. Fracture of the left sixth rib is noted which is acute. Fractures of the fourth and fifth ribs on the left are noted with callus formation consistent with prior fracture and healing. IMPRESSION: Acute left sixth rib fracture without complicating factors. Healing rib fractures involving the fourth and fifth ribs on the left. Degenerative changes of the shoulder joint. Electronically Signed   By: Inez Catalina M.D.   On: 12/22/2022 19:48   DG Lumbar Spine Complete  Result Date: 12/22/2022 CLINICAL DATA:  Recent fall with low back pain, initial encounter EXAM: LUMBAR SPINE - COMPLETE 4+ VIEW COMPARISON:  06/08/2021 FINDINGS: Five lumbar type vertebral bodies are well visualized. Vertebral body height is well maintained. Anterolisthesis of L4 on L5 and L5 on S1 is noted stable in appearance from the prior exam. Prior laminectomy at L4-5 is seen. Osteophytic changes are seen. Mild scoliosis of the thoracolumbar spine is noted. No pars defects are seen. Hypertrophic facet changes are noted. No soft tissue abnormality is noted. IMPRESSION: Multilevel degenerative change and postoperative change. No acute abnormality noted. Electronically Signed   By: Inez Catalina M.D.   On: 12/22/2022 19:46   DG Hip Unilat W or Wo Pelvis 2-3 Views Left  Result Date: 12/22/2022 CLINICAL DATA:  Fall several days ago with left hip pain, initial encounter EXAM: DG HIP (WITH OR WITHOUT PELVIS) 3V LEFT COMPARISON:  None Available. FINDINGS: Pelvic ring is intact. No acute fracture or dislocation is noted. No soft tissue abnormality is seen. IMPRESSION: No acute abnormality noted. Electronically Signed   By: Inez Catalina M.D.   On: 12/22/2022 19:44    Assessment/Plan 1. Hypoxia Not sure the etiology Most Likely Atlectasis due to her Rib fracture Patient did have  multiple Chronic Rib fractures in her Xray  Will Discontinue Neck brace at this time Continue Incentive Spirometry thought she is not doing it due to her dementia Repeat Chest Xray  2. Pain and swelling of right wrist Repeat Xray pending Is on Low dose of prednisone  3 Multiple Rib Fractures Chronic She is not c/o Pain    Family/ staff Communication:   Labs/tests ordered:

## 2022-12-30 DIAGNOSIS — R278 Other lack of coordination: Secondary | ICD-10-CM | POA: Diagnosis not present

## 2022-12-30 DIAGNOSIS — R059 Cough, unspecified: Secondary | ICD-10-CM | POA: Diagnosis not present

## 2022-12-30 DIAGNOSIS — R293 Abnormal posture: Secondary | ICD-10-CM | POA: Diagnosis not present

## 2022-12-30 DIAGNOSIS — M25552 Pain in left hip: Secondary | ICD-10-CM | POA: Diagnosis not present

## 2022-12-31 DIAGNOSIS — R278 Other lack of coordination: Secondary | ICD-10-CM | POA: Diagnosis not present

## 2022-12-31 DIAGNOSIS — M25552 Pain in left hip: Secondary | ICD-10-CM | POA: Diagnosis not present

## 2022-12-31 DIAGNOSIS — R293 Abnormal posture: Secondary | ICD-10-CM | POA: Diagnosis not present

## 2023-01-03 DIAGNOSIS — M25552 Pain in left hip: Secondary | ICD-10-CM | POA: Diagnosis not present

## 2023-01-03 DIAGNOSIS — R278 Other lack of coordination: Secondary | ICD-10-CM | POA: Diagnosis not present

## 2023-01-03 DIAGNOSIS — R293 Abnormal posture: Secondary | ICD-10-CM | POA: Diagnosis not present

## 2023-01-04 DIAGNOSIS — R293 Abnormal posture: Secondary | ICD-10-CM | POA: Diagnosis not present

## 2023-01-04 DIAGNOSIS — M25552 Pain in left hip: Secondary | ICD-10-CM | POA: Diagnosis not present

## 2023-01-04 DIAGNOSIS — R278 Other lack of coordination: Secondary | ICD-10-CM | POA: Diagnosis not present

## 2023-01-05 ENCOUNTER — Other Ambulatory Visit: Payer: Self-pay | Admitting: Orthopedic Surgery

## 2023-01-05 DIAGNOSIS — R278 Other lack of coordination: Secondary | ICD-10-CM | POA: Diagnosis not present

## 2023-01-05 DIAGNOSIS — R293 Abnormal posture: Secondary | ICD-10-CM | POA: Diagnosis not present

## 2023-01-05 DIAGNOSIS — F419 Anxiety disorder, unspecified: Secondary | ICD-10-CM

## 2023-01-05 DIAGNOSIS — M25552 Pain in left hip: Secondary | ICD-10-CM | POA: Diagnosis not present

## 2023-01-05 MED ORDER — CLONAZEPAM 0.5 MG PO TABS: 0.2500 mg | ORAL_TABLET | Freq: Every day | ORAL | 0 refills | Status: DC

## 2023-01-06 DIAGNOSIS — R278 Other lack of coordination: Secondary | ICD-10-CM | POA: Diagnosis not present

## 2023-01-06 DIAGNOSIS — R293 Abnormal posture: Secondary | ICD-10-CM | POA: Diagnosis not present

## 2023-01-06 DIAGNOSIS — M25552 Pain in left hip: Secondary | ICD-10-CM | POA: Diagnosis not present

## 2023-01-07 DIAGNOSIS — R293 Abnormal posture: Secondary | ICD-10-CM | POA: Diagnosis not present

## 2023-01-07 DIAGNOSIS — M25552 Pain in left hip: Secondary | ICD-10-CM | POA: Diagnosis not present

## 2023-01-07 DIAGNOSIS — R278 Other lack of coordination: Secondary | ICD-10-CM | POA: Diagnosis not present

## 2023-01-10 ENCOUNTER — Non-Acute Institutional Stay (SKILLED_NURSING_FACILITY): Payer: Medicare Other | Admitting: Adult Health

## 2023-01-10 ENCOUNTER — Encounter: Payer: Self-pay | Admitting: Adult Health

## 2023-01-10 DIAGNOSIS — M25431 Effusion, right wrist: Secondary | ICD-10-CM | POA: Diagnosis not present

## 2023-01-10 DIAGNOSIS — R0902 Hypoxemia: Secondary | ICD-10-CM | POA: Diagnosis not present

## 2023-01-10 DIAGNOSIS — S2243XD Multiple fractures of ribs, bilateral, subsequent encounter for fracture with routine healing: Secondary | ICD-10-CM

## 2023-01-10 DIAGNOSIS — R293 Abnormal posture: Secondary | ICD-10-CM | POA: Diagnosis not present

## 2023-01-10 DIAGNOSIS — M542 Cervicalgia: Secondary | ICD-10-CM

## 2023-01-10 DIAGNOSIS — M25531 Pain in right wrist: Secondary | ICD-10-CM | POA: Diagnosis not present

## 2023-01-10 DIAGNOSIS — R278 Other lack of coordination: Secondary | ICD-10-CM | POA: Diagnosis not present

## 2023-01-10 DIAGNOSIS — M25552 Pain in left hip: Secondary | ICD-10-CM | POA: Diagnosis not present

## 2023-01-10 MED ORDER — COLCHICINE 0.6 MG PO TABS: 1.2000 mg | ORAL_TABLET | Freq: Every day | ORAL | 0 refills | Status: DC

## 2023-01-10 NOTE — Progress Notes (Addendum)
Location:  [facility] Room Number: 146A Place of Service:  SNF [phone]) Provider:  Tamsen Roers, MD  Patient Care Team: Mahlon Gammon, MD as PCP - General (Internal Medicine) Nahser, Deloris Ping, MD as PCP - Cardiology (Cardiology) Vida Rigger, MD as Consulting Physician (Gastroenterology) Levert Feinstein, MD as Consulting Physician (Neurology) Barnett Abu, MD as Consulting Physician (Neurosurgery) [place], Amy, MD as Consulting Physician (Dermatology) Jerene Bears, MD as Consulting Physician (Gynecology) [place], Heather, OD (Optometry) Mahlon Gammon, MD (Internal Medicine)  Extended Emergency Contact Information Primary Emergency Contact: Timm,Robert J Address: [address]          [place], [place] [address] [place] of [place] Home Phone: [phone] Work Phone: [phone] Mobile Phone: [phone] Relation: Spouse Secondary Emergency Contact: [place] of [place] Home Phone: [phone] Mobile Phone: [phone] Relation: Son  Code Status:  DNR Goals of care: Advanced Directive information    01/10/2023    9:18 AM  Advanced Directives  Does Patient Have a Medical Advance Directive? Yes  Type of Estate agent of [place];Living will;Out of facility DNR (pink MOST or yellow form)  Does patient want to make changes to medical advance directive? No - Patient declined  Copy of Healthcare Power of Attorney in Chart? Yes - validated most recent copy scanned in chart (See row information)  Pre-existing out of facility DNR order (yellow form or pink MOST form) Yellow form placed in chart (order not valid for inpatient use)     Chief Complaint  Patient presents with   Acute Visit    Patient is being seen for acute wrist pain    Quality Metric Gaps    Patient will be due for an AWV after 02/05/2023    HPI:  Pt is a 82 y.o. female seen today for an acute visit for  right wrist pain  VS 120/75 71 17 98.1 92 RA  Symptoms present since 4/1  of pain and swelling and tenderness.  Did have a fall prior to this injury, has fallen multiple times, poor historian due to dementia.  Had xray in facility which was negative.  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  She took a round of low dose prednisone with minimal improvement.  Continues with pain and swelling using brace for comfort  She states today that she does not feel well but can't be more specific.  Had CXR on 12/30/22 due to cough and requiring oxygen which did not show acute process but old rib fractures from prior falls. Covid swab was negative.  Husband did have covid. Cough is improving. No longer on oxygen. No fever or sputum production.   Also has some hip pain, back pain, neck pain and is using prn norco, followed by ortho. Hx of spinal stenosis, degenerative disc disease.  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 Street of back injections by Dr. Murray Hodgkins 2016 with good relief, history of cervical DD with possiblity of surgery by Dr. Dutch Quint.   GERD (gastroesophageal reflux disease)    IBS with  moderate refluc seen on upper GI study form January 2011 Dr. Ewing Schlein   HTN (hypertension)    no meds   Hyperlipidemia    Hypothyroidism    IBS (irritable bowel syndrome)    Imbalance    Impingement syndrome of right shoulder 01/05/2013   Mass of left side of neck    Memory loss    Mood disorder    MVP (mitral valve prolapse)    Hx of    Osteopenia    Peripheral edema    Right rotator cuff tear 01/05/2013   Sciatic pain    recurrent   Seasonal allergies     Stroke    Tension headache    TIA (transient ischemic attack)    Tremor    Past Surgical History:  Procedure Laterality Date   ANTERIOR CERVICAL DECOMP/DISCECTOMY FUSION  04/2015    Dr. Rise Mu, [facility]   CARDIAC CATHETERIZATION  2004   which revealed smooth and normal coronary arteries   CARPAL TUNNEL RELEASE  2000   lt   CATARACT EXTRACTION W/ INTRAOCULAR LENS  IMPLANT, BILATERAL  1/17   Dr. Delaney Meigs   COSMETIC SURGERY  2010   eyes-facial   DENTAL SURGERY  1960   LASIK     LOOP RECORDER INSERTION N/A 09/22/2017   Procedure: LOOP RECORDER INSERTION;  Surgeon: Regan Lemming, MD;  Location: [facility];  Service: Cardiovascular;  Laterality: N/A;   LUMBAR LAMINECTOMY/DECOMPRESSION MICRODISCECTOMY N/A 06/08/2021   Procedure: Laminectomy and Foraminotomy - L4-L5, L5-S1;  Surgeon: Julio Sicks, MD;  Location: [facility] OR;  Service: Neurosurgery;  Laterality: N/A;   SHOULDER ARTHROSCOPY WITH ROTATOR CUFF REPAIR Right 01/05/2013   Procedure: SHOULDER ARTHROSCOPY WITH ARTHROSCOPIC  ROTATOR CUFF REPAIR, ACROMIOPLASTY, EXTENSIVE DEBRIDEMENT;  Surgeon: Eulas Post, MD;  Location: [facility];  Service: Orthopedics;  Laterality: Right;   TEE WITHOUT CARDIOVERSION N/A 09/22/2017   Procedure: TRANSESOPHAGEAL ECHOCARDIOGRAM (TEE);  Surgeon: Thurmon Fair, MD;  Location: [facility] ENDOSCOPY;  Service: Cardiovascular;  Laterality: N/A;   TONSILLECTOMY     TUBAL LIGATION      Allergies  Allergen Reactions   Fluticasone Other (See Comments)    DRY EYES   Aciphex [Rabeprazole] Other (See Comments)    Critical    Codeine Nausea Only   Codeine Other (See Comments)    unknown   Cymbalta [Duloxetine Hcl] Other (See Comments)    Critical    Lexapro [Escitalopram] Other (See Comments)    critical   Lipitor [Atorvastatin Calcium] Other (See Comments)    Intolerant    Morphine Sulfate     Per matrix   Pneumovax [Pneumococcal Polysaccharide Vaccine] Other (See Comments)     moderate   Prevacid [Lansoprazole] Other (See Comments)    Unknown, listed on MAR   Rosuvastatin Other (See Comments)    Intolerant    Sertraline Other (See Comments)    critical   Statins Other (See Comments)    Critical    Sulfa Antibiotics Nausea And Vomiting   Tetracyclines & Related Other (See Comments)    Morphine Sulfate   Welchol [Colesevelam] Other (See Comments)    Unknown, listed on MAR   Pneumovax 23 [Pneumococcal Vac Polyvalent] Rash    Injection site reaction    Outpatient Encounter Medications as of 01/10/2023  Medication Sig   acetaminophen (TYLENOL) 325 MG tablet Take 650 mg by mouth 2 (two) times daily as needed.   Calcium Carb-Cholecalciferol (CALCIUM 500 + D PO) Take 1 tablet by mouth  daily.   calcium carbonate (TUMS - DOSED IN MG ELEMENTAL CALCIUM) 500 MG chewable tablet Chew 2 tablets by mouth 2 (two) times daily as needed for indigestion or heartburn.   clonazePAM (KLONOPIN) 0.5 MG tablet Take 0.5 tablets (0.25 mg total) by mouth at bedtime.   clopidogrel (PLAVIX) 75 MG tablet Take 1 tablet (75 mg total) by mouth daily.   diclofenac Sodium (VOLTAREN) 1 % GEL Apply 2 g topically 2 (two) times daily as needed (pain).   donepezil (ARICEPT) 10 MG tablet Take 1 tablet (10 mg total) by mouth daily.   gabapentin (NEURONTIN) 100 MG capsule Take 200 mg by mouth 3 (three) times daily.   HYDROcodone-acetaminophen (NORCO/VICODIN) 5-325 MG tablet Take 1-2 tablets by mouth every 4 (four) hours as needed for moderate pain. 1 tablet for moderate pain or 2 tablets for severe pain   levothyroxine (SYNTHROID) 88 MCG tablet Take 88 mcg by mouth daily before breakfast.   lidocaine 4 % Place 1 patch onto the skin in the morning and at bedtime.   melatonin 5 MG TABS Take 5 mg by mouth at bedtime as needed (sleep).   memantine (NAMENDA) 5 MG tablet Take 5 mg by mouth 2 (two) times daily.   mirabegron ER (MYRBETRIQ) 25 MG TB24 tablet Take 1 tablet (25 mg total) by mouth daily.    Multiple Vitamins-Minerals (PRESERVISION AREDS PO) Take 1 tablet by mouth 2 (two) times daily.    omeprazole (PRILOSEC) 40 MG capsule Take 40 mg by mouth daily.   polyethylene glycol (MIRALAX) 17 g packet Take 17 g by mouth daily.   propranolol (INDERAL) 20 MG tablet Take 20 mg by mouth 2 (two) times daily. 1 tablet (20 mg) oral, Twice A Day   rosuvastatin (CRESTOR) 5 MG tablet Take 1 tablet (5 mg total) by mouth daily.   acetaminophen (TYLENOL) 325 MG tablet Take 650 mg by mouth 3 (three) times daily. (Patient not taking: Reported on 01/10/2023)   No facility-administered encounter medications on file as of 01/10/2023.    Review of Systems  Constitutional:  Positive for activity change (not walking, using lift) and fatigue. Negative for appetite change, chills, diaphoresis, fever and unexpected weight change.  HENT:  Negative for congestion.   Respiratory:  Positive for cough (improving.). Negative for shortness of breath and wheezing.   Cardiovascular:  Negative for chest pain, palpitations and leg swelling.  Gastrointestinal:  Negative for abdominal distention, abdominal pain, constipation and diarrhea.  Genitourinary:  Negative for difficulty urinating and dysuria.  Musculoskeletal:  Positive for arthralgias, gait problem and joint swelling. Negative for back pain and myalgias.       Right wrist pain and swelling.  chronic back pain, neck pain,hip pain  Skin:  Negative for wound.  Neurological:  Negative for dizziness, tremors, seizures, syncope, facial asymmetry, speech difficulty, weakness, light-headedness, numbness and headaches.  Psychiatric/Behavioral:  Positive for confusion. Negative for agitation and behavioral problems. The patient is nervous/anxious.     Immunization History  Administered Date(s) Administered   Fluad Quad(high Dose 65+) 07/02/2022   Influenza, High Dose Seasonal PF 07/23/2017, 07/03/2019   Influenza-Unspecified 05/27/2010, 08/03/2010, 07/22/2011, 07/01/2012,  06/22/2013, 07/18/2020, 07/11/2021, 07/02/2022   Janssen (J&J) SARS-COV-2 Vaccination 11/06/2019   Moderna SARS-COV2 Booster Vaccination 08/05/2021   Moderna Sars-Covid-2 Vaccination 08/07/2020   PFIZER(Purple Top)SARS-COV-2 Vaccination 10/09/2019, 08/07/2020   Pneumococcal Conjugate-13 02/08/2015   Pneumococcal Polysaccharide-23 05/28/2006, 10/13/2011   Pneumococcal-Unspecified 10/17/2013   Td 03/29/2002   Tdap 09/27/2001, 04/25/2002, 08/18/2012, 10/17/2013   Zoster  Recombinat (Shingrix) 05/23/2021, 07/23/2021   Zoster, Live 08/30/2006   Zoster, Unspecified 08/30/2006   Pertinent  Health Maintenance Due  Topic Date Due   INFLUENZA VACCINE  04/28/2023   DEXA SCAN  Completed      08/07/2022   11:17 AM 08/07/2022   11:03 PM 08/08/2022    8:00 PM 08/09/2022    8:00 AM 08/17/2022   10:59 AM  Fall Risk  Falls in the past year?     1  Was there an injury with Fall?     1  Fall Risk Category Calculator     3  Fall Risk Category (Retired)     High  (RETIRED) Patient Fall Risk Level High fall risk High fall risk High fall risk High fall risk High fall risk  Patient at Risk for Falls Due to     History of fall(s);Impaired balance/gait;Impaired mobility  Fall risk Follow up     Falls evaluation completed   Functional Status Survey:    Vitals:   01/10/23 0913  BP: 125/82  Pulse: 73  Resp: 12  Temp: 97.9 F (36.6 C)  TempSrc: Temporal  SpO2: 90%  Weight: 153 lb 6.4 oz (69.6 kg)  Height: 5\' 1"  (1.549 m)   Body mass index is 28.98 kg/m. Physical Exam Vitals and nursing note reviewed.  Constitutional:      General: She is not in acute distress.    Appearance: She is not diaphoretic.  HENT:     Head: Normocephalic and atraumatic.     Right Ear: Tympanic membrane and ear canal normal.     Left Ear: Tympanic membrane and ear canal normal.     Nose: Nose normal.     Mouth/Throat:     Mouth: Mucous membranes are moist.     Pharynx: Oropharynx is clear.  Eyes:      Conjunctiva/sclera: Conjunctivae normal.     Pupils: Pupils are equal, round, and reactive to light.  Neck:     Vascular: No JVD.  Cardiovascular:     Rate and Rhythm: Normal rate and regular rhythm.     Heart sounds: No murmur heard. Pulmonary:     Effort: Pulmonary effort is normal. No respiratory distress.     Breath sounds: Normal breath sounds. No wheezing.  Musculoskeletal:        General: Swelling (right wrist) present. No tenderness (no snuff box tenderness on right.), deformity or signs of injury.     Right lower leg: No edema.     Left lower leg: No edema.     Comments: NO pain with palpation or ROM to either hip or neck area today.  Right wrist has pain with ROM also warmth and redness.  Skin:    General: Skin is warm and dry.  Neurological:     General: No focal deficit present.     Mental Status: She is alert. Mental status is at baseline.     Labs reviewed: Recent Labs    08/07/22 1122 08/07/22 1615 08/07/22 2343 08/16/22 0000 08/16/22 1300 12/22/22 1850  NA 141  --    < > 142 142 138  K 4.2  --    < > 4.1 4.1 4.1  CL 104  --   --  104 104 103  CO2 29  --   --  23* 23* 25  GLUCOSE 99  --   --   --   --  108*  BUN 14  --   --  10 10 11   CREATININE 0.95  --   --  0.7 0.7 0.76  CALCIUM 9.2  --   --  9.3 9.3 8.6*  MG  --  2.2  --   --   --   --   PHOS  --  4.0  --   --   --   --    < > = values in this interval not displayed.   Recent Labs    08/07/22 1615 08/16/22 0000 08/16/22 1300 12/22/22 1850  AST 20 16 16 16   ALT 14 14 14 21   ALKPHOS 69 100 100 109  BILITOT 0.5  --   --  0.8  PROT 6.8  --   --  6.9  ALBUMIN 3.6 4.0 4.0 3.1*   Recent Labs    08/07/22 1122 08/07/22 2343 08/18/22 0000 12/22/22 1850  WBC 9.9  --  7.2 9.7  NEUTROABS 7.5  --   --   --   HGB 14.1 12.6 13.5 13.4  HCT 43.4 37.0 41 41.3  MCV 98.9  --   --  96.9  PLT 301  --  298 350   Lab Results  Component Value Date   TSH 1.147 08/07/2022   Lab Results  Component  Value Date   HGBA1C 5.6 09/21/2017   Lab Results  Component Value Date   CHOL 174 03/23/2022   HDL 66 03/23/2022   LDLCALC 81 03/23/2022   TRIG 134 03/23/2022   CHOLHDL 2.1 09/21/2017    Significant Diagnostic Results in last 30 days:  DG Wrist Complete Right  Result Date: 12/30/2022 CLINICAL DATA:  Right wrist pain. Soft tissue swelling. No recalled specific injury. EXAM: RIGHT WRIST - COMPLETE 3+ VIEW COMPARISON:  None Available. FINDINGS: No fracture. No bone lesion. Skeletal structures are demineralized. Mild narrowing of the trapezium first metacarpal articulation with small marginal osteophytes. Remaining joint spaces are well preserved. 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 There is also calcification over the posterior aspect of the wrists, smaller areas seen along the volar aspect, suspected to be synovial. Surrounding soft tissue swelling most evident dorsally. IMPRESSION: 1. No fracture or dislocation. 2. Mild trapezium first metacarpal articulation osteoarthritis. Remaining joints normally spaced and aligned. 3. Evidence of synovial chondrocalcinosis with surrounding soft tissue swelling consistent with synovitis, etiology unclear. Electronically Signed   By: Amie Portland M.D.   On: 12/30/2022 15:06   CT HEAD WO CONTRAST  Result Date: 12/22/2022 CLINICAL DATA:  Head trauma, moderate-severe; Polytrauma, blunt EXAM: CT HEAD WITHOUT CONTRAST CT CERVICAL SPINE WITHOUT CONTRAST TECHNIQUE: Multidetector CT imaging of the head and cervical spine was performed following the standard protocol without intravenous contrast. Multiplanar CT image reconstructions of the cervical spine were also generated. RADIATION DOSE REDUCTION: This exam was performed according to the departmental dose-optimization program which includes automated exposure control, adjustment of the mA and/or kV according to patient size and/or use of iterative reconstruction technique. COMPARISON:   CT head 08/07/2022, CT neck 10/29/2016 FINDINGS: CT HEAD FINDINGS Brain: Patchy and confluent areas of decreased attenuation are noted throughout the deep and periventricular white matter of the cerebral hemispheres bilaterally, compatible with chronic microvascular ischemic disease. No evidence of large-territorial acute infarction. No parenchymal hemorrhage. No mass lesion. No extra-axial collection. No mass effect or midline shift. No hydrocephalus. Basilar cisterns are patent. Vascular: No hyperdense vessel. Skull: No acute fracture or focal lesion. Sinuses/Orbits: Paranasal sinuses and mastoid air cells are clear. Bilateral lens replacement. Otherwise the orbits  are unremarkable. Other: None. CT CERVICAL SPINE FINDINGS Alignment: Grade 1 anterolisthesis of T2 on T3. Skull base and vertebrae: C5 through C7 anterior cervical discectomy infusion. Multilevel moderate degenerative changes spine. No associated severe osseous neural foraminal or central canal stenosis. No acute fracture. No aggressive appearing focal osseous lesion or focal pathologic process. Soft tissues and spinal canal: No prevertebral fluid or swelling. No visible canal hematoma. Upper chest: Unremarkable. Other: Aortic arch calcification. IMPRESSION: 1. No acute intracranial abnormality. 2. No acute displaced fracture or traumatic listhesis of the cervical spine. Electronically Signed   By: Tish Frederickson M.D.   On: 12/22/2022 20:20   CT CERVICAL SPINE WO CONTRAST  Result Date: 12/22/2022 CLINICAL DATA:  Head trauma, moderate-severe; Polytrauma, blunt EXAM: CT HEAD WITHOUT CONTRAST CT CERVICAL SPINE WITHOUT CONTRAST TECHNIQUE: Multidetector CT imaging of the head and cervical spine was performed following the standard protocol without intravenous contrast. Multiplanar CT image reconstructions of the cervical spine were also generated. RADIATION DOSE REDUCTION: This exam was performed according to the departmental dose-optimization program  which includes automated exposure control, adjustment of the mA and/or kV according to patient size and/or use of iterative reconstruction technique. COMPARISON:  CT head 08/07/2022, CT neck 10/29/2016 FINDINGS: CT HEAD FINDINGS Brain: Patchy and confluent areas of decreased attenuation are noted throughout the deep and periventricular white matter of the cerebral hemispheres bilaterally, compatible with chronic microvascular ischemic disease. No evidence of large-territorial acute infarction. No parenchymal hemorrhage. No mass lesion. No extra-axial collection. No mass effect or midline shift. No hydrocephalus. Basilar cisterns are patent. Vascular: No hyperdense vessel. Skull: No acute fracture or focal lesion. Sinuses/Orbits: Paranasal sinuses and mastoid air cells are clear. Bilateral lens replacement. Otherwise the orbits are unremarkable. Other: None. CT CERVICAL SPINE FINDINGS Alignment: Grade 1 anterolisthesis of T2 on T3. Skull base and vertebrae: C5 through C7 anterior cervical discectomy infusion. Multilevel moderate degenerative changes spine. No associated severe osseous neural foraminal or central canal stenosis. No acute fracture. No aggressive appearing focal osseous lesion or focal pathologic process. Soft tissues and spinal canal: No prevertebral fluid or swelling. No visible canal hematoma. Upper chest: Unremarkable. Other: Aortic arch calcification. IMPRESSION: 1. No acute intracranial abnormality. 2. No acute displaced fracture or traumatic listhesis of the cervical spine. Electronically Signed   By: Tish Frederickson M.D.   On: 12/22/2022 20:20   DG Chest 2 View  Result Date: 12/22/2022 CLINICAL DATA:  Chest pain EXAM: CHEST - 2 VIEW COMPARISON:  08/07/2022 FINDINGS: Cardiac shadow is within normal limits. Lungs are well aerated bilaterally. No focal infiltrate or effusion is seen. Postsurgical changes in the cervical spine are seen. Old rib fractures are noted on left involving the fourth  and fifth ribs with callus formation. Acute sixth rib fracture is noted. Old rib fractures on the right are noted as well. IMPRESSION: Acute sixth rib fracture on the left. Bilateral chronic rib fractures are noted. Electronically Signed   By: Alcide Clever M.D.   On: 12/22/2022 19:49   DG Shoulder Left  Result Date: 12/22/2022 CLINICAL DATA:  Recent fall with left shoulder pain, initial encounter EXAM: LEFT SHOULDER - 2+ VIEW COMPARISON:  None Available. FINDINGS: Mild degenerative changes of the acromioclavicular and glenohumeral joints are seen. Multiple calcified loose bodies are noted in the inferior aspect of the joint. No acute fracture or dislocation is seen. Fracture of the left sixth rib is noted which is acute. Fractures of the fourth and fifth ribs on the left are noted with  callus formation consistent with prior fracture and healing. IMPRESSION: Acute left sixth rib fracture without complicating factors. Healing rib fractures involving the fourth and fifth ribs on the left. Degenerative changes of the shoulder joint. Electronically Signed   By: Alcide Clever M.D.   On: 12/22/2022 19:48   DG Lumbar Spine Complete  Result Date: 12/22/2022 CLINICAL DATA:  Recent fall with low back pain, initial encounter EXAM: LUMBAR SPINE - COMPLETE 4+ VIEW COMPARISON:  06/08/2021 FINDINGS: Five lumbar type vertebral bodies are well visualized. Vertebral body height is well maintained. Anterolisthesis of L4 on L5 and L5 on S1 is noted stable in appearance from the prior exam. Prior laminectomy at L4-5 is seen. Osteophytic changes are seen. Mild scoliosis of the thoracolumbar spine is noted. No pars defects are seen. Hypertrophic facet changes are noted. No soft tissue abnormality is noted. IMPRESSION: Multilevel degenerative change and postoperative change. No acute abnormality noted. Electronically Signed   By: Alcide Clever M.D.   On: 12/22/2022 19:46   DG Hip Unilat W or Wo Pelvis 2-3 Views Left  Result Date:  12/22/2022 CLINICAL DATA:  Fall several days ago with left hip pain, initial encounter EXAM: DG HIP (WITH OR WITHOUT PELVIS) 3V LEFT COMPARISON:  None Available. FINDINGS: Pelvic ring is intact. No acute fracture or dislocation is noted. No soft tissue abnormality is seen. IMPRESSION: No acute abnormality noted. Electronically Signed   By: Alcide Clever M.D.   On: 12/22/2022 19:44    Assessment/Plan  1. Pain and swelling of right wrist ?gout vs pseudo gout If this does not help could try higher dose prednisone Will check ESR and Uric acid.  - colchicine 0.6 MG tablet; Take 2 tablets (1.2 mg total) by mouth daily. Give 1.2 mg x 1 then repeat 0.6 mg in 1 hr  Dispense: 3 tablet; Refill: 0  2. Neck pain Improved Negative CT scan  Wearing collar prn Continue norco prn   3. Hypoxia Resolved Possibly due to rib fx, immobility   4. Multiple closed fractures of ribs of both sides with routine healing, subsequent encounter No current issues with rib pain or breathing at this time  Happened from prior falls. Fall prec   Family/ staff Communication: nurse  Labs/tests ordered:  ESR Uric acid in am

## 2023-01-11 DIAGNOSIS — M2550 Pain in unspecified joint: Secondary | ICD-10-CM | POA: Diagnosis not present

## 2023-01-11 DIAGNOSIS — M25552 Pain in left hip: Secondary | ICD-10-CM | POA: Diagnosis not present

## 2023-01-11 DIAGNOSIS — R293 Abnormal posture: Secondary | ICD-10-CM | POA: Diagnosis not present

## 2023-01-11 DIAGNOSIS — R278 Other lack of coordination: Secondary | ICD-10-CM | POA: Diagnosis not present

## 2023-01-12 DIAGNOSIS — M25552 Pain in left hip: Secondary | ICD-10-CM | POA: Diagnosis not present

## 2023-01-12 DIAGNOSIS — R278 Other lack of coordination: Secondary | ICD-10-CM | POA: Diagnosis not present

## 2023-01-12 DIAGNOSIS — R293 Abnormal posture: Secondary | ICD-10-CM | POA: Diagnosis not present

## 2023-01-13 DIAGNOSIS — R278 Other lack of coordination: Secondary | ICD-10-CM | POA: Diagnosis not present

## 2023-01-13 DIAGNOSIS — M25552 Pain in left hip: Secondary | ICD-10-CM | POA: Diagnosis not present

## 2023-01-13 DIAGNOSIS — R293 Abnormal posture: Secondary | ICD-10-CM | POA: Diagnosis not present

## 2023-01-14 DIAGNOSIS — M25552 Pain in left hip: Secondary | ICD-10-CM | POA: Diagnosis not present

## 2023-01-14 DIAGNOSIS — R293 Abnormal posture: Secondary | ICD-10-CM | POA: Diagnosis not present

## 2023-01-14 DIAGNOSIS — R278 Other lack of coordination: Secondary | ICD-10-CM | POA: Diagnosis not present

## 2023-01-17 ENCOUNTER — Encounter: Payer: Self-pay | Admitting: Adult Health

## 2023-01-17 ENCOUNTER — Non-Acute Institutional Stay (SKILLED_NURSING_FACILITY): Payer: Medicare Other | Admitting: Adult Health

## 2023-01-17 DIAGNOSIS — M25552 Pain in left hip: Secondary | ICD-10-CM | POA: Diagnosis not present

## 2023-01-17 DIAGNOSIS — R278 Other lack of coordination: Secondary | ICD-10-CM | POA: Diagnosis not present

## 2023-01-17 DIAGNOSIS — F321 Major depressive disorder, single episode, moderate: Secondary | ICD-10-CM | POA: Diagnosis not present

## 2023-01-17 DIAGNOSIS — R293 Abnormal posture: Secondary | ICD-10-CM | POA: Diagnosis not present

## 2023-01-17 NOTE — Progress Notes (Unsigned)
Location:  [facility] Room Number: 146A Place of Service:  SNF [phone]) Provider:  Tamsen Roers, MD  Patient Care Team: Mahlon Gammon, MD as PCP - General (Internal Medicine) Nahser, Deloris Ping, MD as PCP - Cardiology (Cardiology) Vida Rigger, MD as Consulting Physician (Gastroenterology) Levert Feinstein, MD as Consulting Physician (Neurology) Barnett Abu, MD as Consulting Physician (Neurosurgery) [place], Amy, MD as Consulting Physician (Dermatology) Jerene Bears, MD as Consulting Physician (Gynecology) [place], Heather, OD (Optometry) Mahlon Gammon, MD (Internal Medicine)  Extended Emergency Contact Information Primary Emergency Contact: Cephas,Robert J Address: [address]          [place], [place] [address] [place] Home Phone: [phone] Work Phone: [phone] Mobile Phone: [phone] Relation: Spouse Secondary Emergency Contact: [place] of [place] Home Phone: [phone] Mobile Phone: [phone] Relation: Son  Code Status:  DNR Goals of care: Advanced Directive information    01/17/2023   10:40 AM  Advanced Directives  Does Patient Have a Medical Advance Directive? Yes  Type of Estate agent of [place];Living will;Out of facility DNR (pink MOST or yellow form)  Does patient want to make changes to medical advance directive? No - Patient declined  Copy of Healthcare Power of Attorney in Chart? Yes - validated most recent copy scanned in chart (See row information)  Pre-existing out of facility DNR order (yellow form or pink MOST form) Yellow form placed in chart (order not valid for inpatient use)     Chief Complaint  Patient presents with   Acute Visit    Patient is being seen for depression    Health Maintenance    Discuss the need for Updated covid Vaccine and AWV after 02/07/2023    HPI:  Pt is a 82 y.o. female seen today for an acute  visit for depression  Nurse reports pt is not wanting to participate in activities, lacks motivation to perform ADLs, appears depressed, eating less.  Pt endorses depression stating "I am tired of all this". When asked for clarification she is referring to her multiple health problems. She has dementia which progressed and now lives in skilled care apart from her husband. She also has developed chronic pain to the back and hip, more recently issues with her neck, wrist etc.   She has tried cymbalta in the past, also lexapro, sertraline, and buspar. Hx of twitching/tremors on propranolol. Unclear of reaction to the above meds as it is not listed in the allergy list.  She has a hx of chronic anxiety with panic attacks for years. Has been using clonazepam.     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 Street of back injections by Dr. Murray Hodgkins 2016 with good relief, history of cervical DD with possiblity of surgery by Dr. Dutch Quint.   GERD (gastroesophageal reflux disease)    IBS with moderate refluc seen on upper GI study form January 2011 Dr. Ewing Schlein   HTN (hypertension)    no meds   Hyperlipidemia    Hypothyroidism    IBS (irritable bowel syndrome)    Imbalance    Impingement syndrome of right shoulder  01/05/2013   Mass of left side of neck    Memory loss    Mood disorder    MVP (mitral valve prolapse)    Hx of    Osteopenia    Peripheral edema    Right rotator cuff tear 01/05/2013   Sciatic pain    recurrent   Seasonal allergies    Stroke    Tension headache    TIA (transient ischemic attack)    Tremor    Past Surgical History:  Procedure Laterality Date   ANTERIOR CERVICAL DECOMP/DISCECTOMY FUSION  04/2015    Dr.  Rise Mu, [facility]   CARDIAC CATHETERIZATION  2004   which revealed smooth and normal coronary arteries   CARPAL TUNNEL RELEASE  2000   lt   CATARACT EXTRACTION W/ INTRAOCULAR LENS  IMPLANT, BILATERAL  1/17   Dr. Delaney Meigs   COSMETIC SURGERY  2010   eyes-facial   DENTAL SURGERY  1960   LASIK     LOOP RECORDER INSERTION N/A 09/22/2017   Procedure: LOOP RECORDER INSERTION;  Surgeon: Regan Lemming, MD;  Location: [facility];  Service: Cardiovascular;  Laterality: N/A;   LUMBAR LAMINECTOMY/DECOMPRESSION MICRODISCECTOMY N/A 06/08/2021   Procedure: Laminectomy and Foraminotomy - L4-L5, L5-S1;  Surgeon: Julio Sicks, MD;  Location: [facility] OR;  Service: Neurosurgery;  Laterality: N/A;   SHOULDER ARTHROSCOPY WITH ROTATOR CUFF REPAIR Right 01/05/2013   Procedure: SHOULDER ARTHROSCOPY WITH ARTHROSCOPIC  ROTATOR CUFF REPAIR, ACROMIOPLASTY, EXTENSIVE DEBRIDEMENT;  Surgeon: Eulas Post, MD;  Location: [facility];  Service: Orthopedics;  Laterality: Right;   TEE WITHOUT CARDIOVERSION N/A 09/22/2017   Procedure: TRANSESOPHAGEAL ECHOCARDIOGRAM (TEE);  Surgeon: Thurmon Fair, MD;  Location: [facility];  Service: Cardiovascular;  Laterality: N/A;   TONSILLECTOMY     TUBAL LIGATION      Allergies  Allergen Reactions   Fluticasone Other (See Comments)    DRY EYES   Aciphex [Rabeprazole] Other (See Comments)    Critical    Codeine Nausea Only   Codeine Other (See Comments)    unknown   Cymbalta [Duloxetine Hcl] Other (See Comments)    Critical    Lexapro [Escitalopram] Other (See Comments)    critical   Lipitor [Atorvastatin Calcium] Other (See Comments)    Intolerant    Morphine Sulfate     Per matrix   Pneumovax [Pneumococcal Polysaccharide Vaccine] Other (See Comments)    moderate   Prevacid [Lansoprazole] Other (See Comments)    Unknown, listed on MAR   Rosuvastatin Other (See Comments)    Intolerant    Sertraline Other (See Comments)    critical   Statins  Other (See Comments)    Critical    Sulfa Antibiotics Nausea And Vomiting   Tetracyclines & Related Other (See Comments)    Morphine Sulfate   Welchol [Colesevelam] Other (See Comments)    Unknown, listed on MAR   Pneumovax 23 [Pneumococcal Vac Polyvalent] Rash    Injection site reaction    Outpatient Encounter Medications as of 01/17/2023  Medication Sig   acetaminophen (TYLENOL) 325 MG tablet Take 650 mg by mouth 2 (two) times daily as needed.   acetaminophen (TYLENOL) 325 MG tablet Take 650 mg by mouth 3 (three) times daily.   Calcium Carb-Cholecalciferol (CALCIUM 500 + D PO) Take 1 tablet by mouth daily.   calcium carbonate (TUMS - DOSED IN MG ELEMENTAL CALCIUM) 500 MG chewable tablet Chew 2 tablets by mouth 2 (two) times daily as needed for indigestion or heartburn.  clonazePAM (KLONOPIN) 0.5 MG tablet Take 0.5 tablets (0.25 mg total) by mouth at bedtime.   clopidogrel (PLAVIX) 75 MG tablet Take 1 tablet (75 mg total) by mouth daily.   colchicine 0.6 MG tablet Take 2 tablets (1.2 mg total) by mouth daily. Give 1.2 mg x 1 then repeat 0.6 mg in 1 hr   diclofenac Sodium (VOLTAREN) 1 % GEL Apply 2 g topically 2 (two) times daily as needed (pain).   donepezil (ARICEPT) 10 MG tablet Take 1 tablet (10 mg total) by mouth daily.   gabapentin (NEURONTIN) 100 MG capsule Take 200 mg by mouth 3 (three) times daily.   HYDROcodone-acetaminophen (NORCO/VICODIN) 5-325 MG tablet Take 1-2 tablets by mouth every 4 (four) hours as needed for moderate pain. 1 tablet for moderate pain or 2 tablets for severe pain   levothyroxine (SYNTHROID) 88 MCG tablet Take 88 mcg by mouth daily before breakfast.   lidocaine 4 % Place 1 patch onto the skin in the morning and at bedtime.   melatonin 5 MG TABS Take 5 mg by mouth at bedtime as needed (sleep).   memantine (NAMENDA) 5 MG tablet Take 5 mg by mouth 2 (two) times daily.   mirabegron ER (MYRBETRIQ) 25 MG TB24 tablet Take 1 tablet (25 mg total) by mouth daily.    Multiple Vitamins-Minerals (PRESERVISION AREDS PO) Take 1 tablet by mouth 2 (two) times daily.    omeprazole (PRILOSEC) 40 MG capsule Take 40 mg by mouth daily.   polyethylene glycol (MIRALAX) 17 g packet Take 17 g by mouth daily.   propranolol (INDERAL) 20 MG tablet Take 20 mg by mouth 2 (two) times daily. 1 tablet (20 mg) oral, Twice A Day   rosuvastatin (CRESTOR) 5 MG tablet Take 1 tablet (5 mg total) by mouth daily.   No facility-administered encounter medications on file as of 01/17/2023.    Review of Systems  Constitutional:  Positive for activity change, appetite change and fatigue. Negative for chills, diaphoresis, fever and unexpected weight change.  HENT:  Negative for congestion.   Respiratory:  Negative for cough, shortness of breath and wheezing.   Cardiovascular:  Negative for chest pain, palpitations and leg swelling.  Gastrointestinal:  Negative for abdominal distention, abdominal pain, constipation and diarrhea.  Genitourinary:  Negative for difficulty urinating and dysuria.  Musculoskeletal:  Positive for arthralgias, back pain and gait problem. Negative for joint swelling and myalgias.  Neurological:  Negative for dizziness, tremors, seizures, syncope, facial asymmetry, speech difficulty, weakness, light-headedness, numbness and headaches.  Psychiatric/Behavioral:  Positive for confusion and dysphoric mood. Negative for agitation, behavioral problems, hallucinations, self-injury, sleep disturbance and suicidal ideas. The patient is nervous/anxious. The patient is not hyperactive.     Immunization History  Administered Date(s) Administered   Fluad Quad(high Dose 65+) 07/02/2022   Influenza, High Dose Seasonal PF 07/23/2017, 07/03/2019   Influenza-Unspecified 05/27/2010, 08/03/2010, 07/22/2011, 07/01/2012, 06/22/2013, 07/18/2020, 07/11/2021, 07/02/2022   Janssen (J&J) SARS-COV-2 Vaccination 11/06/2019   Moderna SARS-COV2 Booster Vaccination 08/05/2021   Moderna  Sars-Covid-2 Vaccination 08/07/2020   PFIZER(Purple Top)SARS-COV-2 Vaccination 10/09/2019, 08/07/2020   Pneumococcal Conjugate-13 02/08/2015   Pneumococcal Polysaccharide-23 05/28/2006, 10/13/2011   Pneumococcal-Unspecified 10/17/2013   Td 03/29/2002   Tdap 09/27/2001, 04/25/2002, 08/18/2012, 10/17/2013   Zoster Recombinat (Shingrix) 05/23/2021, 07/23/2021   Zoster, Live 08/30/2006   Zoster, Unspecified 08/30/2006   Pertinent  Health Maintenance Due  Topic Date Due   INFLUENZA VACCINE  04/28/2023   DEXA SCAN  Completed      08/07/2022  11:17 AM 08/07/2022   11:03 PM 08/08/2022    8:00 PM 08/09/2022    8:00 AM 08/17/2022   10:59 AM  Fall Risk  Falls in the past year?     1  Was there an injury with Fall?     1  Fall Risk Category Calculator     3  Fall Risk Category (Retired)     High  (RETIRED) Patient Fall Risk Level High fall risk High fall risk High fall risk High fall risk High fall risk  Patient at Risk for Falls Due to     History of fall(s);Impaired balance/gait;Impaired mobility  Fall risk Follow up     Falls evaluation completed   Functional Status Survey:    Vitals:   01/17/23 1038  BP: 118/77  Pulse: 71  Resp: 20  Temp: (!) 97.5 F (36.4 C)  TempSrc: Temporal  SpO2: 97%  Weight: 153 lb 6.4 oz (69.6 kg)  Height:  (1.549 m)   Body mass index is 28.98 kg/m. Physical Exam Vitals and nursing note reviewed.  Constitutional:      General: She is not in acute distress.    Appearance: She is not diaphoretic.  HENT:     Head: Normocephalic and atraumatic.  Neck:     Vascular: No JVD.  Cardiovascular:     Rate and Rhythm: Normal rate and regular rhythm.     Heart sounds: No murmur heard. Pulmonary:     Effort: Pulmonary effort is normal. No respiratory distress.     Breath sounds: Normal breath sounds. No wheezing.  Skin:    General: Skin is warm and dry.  Neurological:     General: No focal deficit present.     Mental Status: She is alert.  Mental status is at baseline.     Labs reviewed: Recent Labs    08/07/22 1122 08/07/22 1615 08/07/22 2343 08/16/22 0000 08/16/22 1300 12/22/22 1850  NA 141  --    < > 142 142 138  K 4.2  --    < > 4.1 4.1 4.1  CL 104  --   --  104 104 103  CO2 29  --   --  23* 23* 25  GLUCOSE 99  --   --   --   --  108*  BUN 14  --   --  CREATININE 0.95  --   --  0.7 0.7 0.76  CALCIUM 9.2  --   --  9.3 9.3 8.6*  MG  --  2.2  --   --   --   --   PHOS  --  4.0  --   --   --   --    < > = values in this interval not displayed.   Recent Labs    08/07/22 1615 08/16/22 0000 08/16/22 1300 12/22/22 1850  AST ALT ALKPHOS 69 100 100 109  BILITOT 0.5  --   --  0.8  PROT 6.8  --   --  6.9  ALBUMIN 3.6 4.0 4.0 3.1*   Recent Labs    08/07/22 1122 08/07/22 2343 08/18/22 0000 12/22/22 1850  WBC 9.9  --  7.2 9.7  NEUTROABS 7.5  --   --   --   HGB 14.1 12.6 13.5 13.4  HCT 43.4 37.0 41 41.3  MCV 98.9  --   --  96.9  PLT 301  --  298 350   Lab Results  Component Value Date   TSH 1.147 08/07/2022   Lab Results  Component Value Date   HGBA1C 5.6 09/21/2017   Lab Results  Component Value Date   CHOL 174 03/23/2022   HDL 66 03/23/2022   LDLCALC 81 03/23/2022   TRIG 134 03/23/2022   CHOLHDL 2.1 09/21/2017    Significant Diagnostic Results in last 30 days:  DG Wrist Complete Right  Result Date: 12/30/2022 CLINICAL DATA:  Right wrist pain. Soft tissue swelling. No recalled specific injury. EXAM: RIGHT WRIST - COMPLETE 3+ VIEW COMPARISON:  None Available. FINDINGS: No fracture. No bone lesion. Skeletal structures are demineralized. Mild narrowing of the trapezium first metacarpal articulation with small marginal osteophytes. Remaining joint spaces are well preserved. 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 There is also calcification over the posterior aspect of the wrists, smaller areas seen along the volar aspect, suspected to  be synovial. Surrounding soft tissue swelling most evident dorsally. IMPRESSION: 1. No fracture or dislocation. 2. Mild trapezium first metacarpal articulation osteoarthritis. Remaining joints normally spaced and aligned. 3. Evidence of synovial chondrocalcinosis with surrounding soft tissue swelling consistent with synovitis, etiology unclear. Electronically Signed   By: Amie Portland M.D.   On: 12/30/2022 15:06   CT HEAD WO CONTRAST  Result Date: 12/22/2022 CLINICAL DATA:  Head trauma, moderate-severe; Polytrauma, blunt EXAM: CT HEAD WITHOUT CONTRAST CT CERVICAL SPINE WITHOUT CONTRAST TECHNIQUE: Multidetector CT imaging of the head and cervical spine was performed following the standard protocol without intravenous contrast. Multiplanar CT image reconstructions of the cervical spine were also generated. RADIATION DOSE REDUCTION: This exam was performed according to the departmental dose-optimization program which includes automated exposure control, adjustment of the mA and/or kV according to patient size and/or use of iterative reconstruction technique. COMPARISON:  CT head 08/07/2022, CT neck 10/29/2016 FINDINGS: CT HEAD FINDINGS Brain: Patchy and confluent areas of decreased attenuation are noted throughout the deep and periventricular white matter of the cerebral hemispheres bilaterally, compatible with chronic microvascular ischemic disease. No evidence of large-territorial acute infarction. No parenchymal hemorrhage. No mass lesion. No extra-axial collection. No mass effect or midline shift. No hydrocephalus. Basilar cisterns are patent. Vascular: No hyperdense vessel. Skull: No acute fracture or focal lesion. Sinuses/Orbits: Paranasal sinuses and mastoid air cells are clear. Bilateral lens replacement. Otherwise the orbits are unremarkable. Other: None. CT CERVICAL SPINE FINDINGS Alignment: Grade 1 anterolisthesis of T2 on T3. Skull base and vertebrae: C5 through C7 anterior cervical discectomy  infusion. Multilevel moderate degenerative changes spine. No associated severe osseous neural foraminal or central canal stenosis. No acute fracture. No aggressive appearing focal osseous lesion or focal pathologic process. Soft tissues and spinal canal: No prevertebral fluid or swelling. No visible canal hematoma. Upper chest: Unremarkable. Other: Aortic arch calcification. IMPRESSION: 1. No acute intracranial abnormality. 2. No acute displaced fracture or traumatic listhesis of the cervical spine. Electronically Signed   By: Tish Frederickson M.D.   On: 12/22/2022 20:20   CT CERVICAL SPINE WO CONTRAST  Result Date: 12/22/2022 CLINICAL DATA:  Head trauma, moderate-severe; Polytrauma, blunt EXAM: CT HEAD WITHOUT CONTRAST CT CERVICAL SPINE WITHOUT CONTRAST TECHNIQUE: Multidetector CT imaging of the head and cervical spine was performed following the standard protocol without intravenous contrast. Multiplanar CT image reconstructions of the cervical spine were also generated. RADIATION DOSE REDUCTION: This exam was performed according to the departmental dose-optimization program which includes automated exposure control, adjustment of the mA and/or kV according to patient size and/or  use of iterative reconstruction technique. COMPARISON:  CT head 08/07/2022, CT neck 10/29/2016 FINDINGS: CT HEAD FINDINGS Brain: Patchy and confluent areas of decreased attenuation are noted throughout the deep and periventricular white matter of the cerebral hemispheres bilaterally, compatible with chronic microvascular ischemic disease. No evidence of large-territorial acute infarction. No parenchymal hemorrhage. No mass lesion. No extra-axial collection. No mass effect or midline shift. No hydrocephalus. Basilar cisterns are patent. Vascular: No hyperdense vessel. Skull: No acute fracture or focal lesion. Sinuses/Orbits: Paranasal sinuses and mastoid air cells are clear. Bilateral lens replacement. Otherwise the orbits are  unremarkable. Other: None. CT CERVICAL SPINE FINDINGS Alignment: Grade 1 anterolisthesis of T2 on T3. Skull base and vertebrae: C5 through C7 anterior cervical discectomy infusion. Multilevel moderate degenerative changes spine. No associated severe osseous neural foraminal or central canal stenosis. No acute fracture. No aggressive appearing focal osseous lesion or focal pathologic process. Soft tissues and spinal canal: No prevertebral fluid or swelling. No visible canal hematoma. Upper chest: Unremarkable. Other: Aortic arch calcification. IMPRESSION: 1. No acute intracranial abnormality. 2. No acute displaced fracture or traumatic listhesis of the cervical spine. Electronically Signed   By: Tish Frederickson M.D.   On: 12/22/2022 20:20   DG Chest 2 View  Result Date: 12/22/2022 CLINICAL DATA:  Chest pain EXAM: CHEST - 2 VIEW COMPARISON:  08/07/2022 FINDINGS: Cardiac shadow is within normal limits. Lungs are well aerated bilaterally. No focal infiltrate or effusion is seen. Postsurgical changes in the cervical spine are seen. Old rib fractures are noted on left involving the fourth and fifth ribs with callus formation. Acute sixth rib fracture is noted. Old rib fractures on the right are noted as well. IMPRESSION: Acute sixth rib fracture on the left. Bilateral chronic rib fractures are noted. Electronically Signed   By: Alcide Clever M.D.   On: 12/22/2022 19:49   DG Shoulder Left  Result Date: 12/22/2022 CLINICAL DATA:  Recent fall with left shoulder pain, initial encounter EXAM: LEFT SHOULDER - 2+ VIEW COMPARISON:  None Available. FINDINGS: Mild degenerative changes of the acromioclavicular and glenohumeral joints are seen. Multiple calcified loose bodies are noted in the inferior aspect of the joint. No acute fracture or dislocation is seen. Fracture of the left sixth rib is noted which is acute. Fractures of the fourth and fifth ribs on the left are noted with callus formation consistent with prior  fracture and healing. IMPRESSION: Acute left sixth rib fracture without complicating factors. Healing rib fractures involving the fourth and fifth ribs on the left. Degenerative changes of the shoulder joint. Electronically Signed   By: Alcide Clever M.D.   On: 12/22/2022 19:48   DG Lumbar Spine Complete  Result Date: 12/22/2022 CLINICAL DATA:  Recent fall with low back pain, initial encounter EXAM: LUMBAR SPINE - COMPLETE 4+ VIEW COMPARISON:  06/08/2021 FINDINGS: Five lumbar type vertebral bodies are well visualized. Vertebral body height is well maintained. Anterolisthesis of L4 on L5 and L5 on S1 is noted stable in appearance from the prior exam. Prior laminectomy at L4-5 is seen. Osteophytic changes are seen. Mild scoliosis of the thoracolumbar spine is noted. No pars defects are seen. Hypertrophic facet changes are noted. No soft tissue abnormality is noted. IMPRESSION: Multilevel degenerative change and postoperative change. No acute abnormality noted. Electronically Signed   By: Alcide Clever M.D.   On: 12/22/2022 19:46   DG Hip Unilat W or Wo Pelvis 2-3 Views Left  Result Date: 12/22/2022 CLINICAL DATA:  Fall several days ago with  left hip pain, initial encounter EXAM: DG HIP (WITH OR WITHOUT PELVIS) 3V LEFT COMPARISON:  None Available. FINDINGS: Pelvic ring is intact. No acute fracture or dislocation is noted. No soft tissue abnormality is seen. IMPRESSION: No acute abnormality noted. Electronically Signed   By: Alcide Clever M.D.   On: 12/22/2022 19:44    Assessment/Plan  1. Current moderate episode of major depressive disorder without prior episode  - buPROPion (WELLBUTRIN) 75 MG tablet; Take 1 tablet (75 mg total) by mouth 2 (two) times daily.  Dispense: 60 tablet; Refill: 0  Will f/u in 4-6 weeks to see how she is doing and check mood, appetite, weight  Family/ staff Communication: left message with son   Labs/tests ordered:  BMP 1 week

## 2023-01-18 ENCOUNTER — Encounter: Payer: Self-pay | Admitting: Adult Health

## 2023-01-18 DIAGNOSIS — M25552 Pain in left hip: Secondary | ICD-10-CM | POA: Diagnosis not present

## 2023-01-18 DIAGNOSIS — R293 Abnormal posture: Secondary | ICD-10-CM | POA: Diagnosis not present

## 2023-01-18 DIAGNOSIS — R278 Other lack of coordination: Secondary | ICD-10-CM | POA: Diagnosis not present

## 2023-01-18 MED ORDER — BUPROPION HCL 75 MG PO TABS: 75.0000 mg | ORAL_TABLET | Freq: Two times a day (BID) | ORAL | 0 refills | Status: DC

## 2023-01-19 DIAGNOSIS — R278 Other lack of coordination: Secondary | ICD-10-CM | POA: Diagnosis not present

## 2023-01-19 DIAGNOSIS — M25552 Pain in left hip: Secondary | ICD-10-CM | POA: Diagnosis not present

## 2023-01-19 DIAGNOSIS — R293 Abnormal posture: Secondary | ICD-10-CM | POA: Diagnosis not present

## 2023-01-20 DIAGNOSIS — M25552 Pain in left hip: Secondary | ICD-10-CM | POA: Diagnosis not present

## 2023-01-20 DIAGNOSIS — R278 Other lack of coordination: Secondary | ICD-10-CM | POA: Diagnosis not present

## 2023-01-20 DIAGNOSIS — R293 Abnormal posture: Secondary | ICD-10-CM | POA: Diagnosis not present

## 2023-01-21 DIAGNOSIS — R293 Abnormal posture: Secondary | ICD-10-CM | POA: Diagnosis not present

## 2023-01-21 DIAGNOSIS — Z23 Encounter for immunization: Secondary | ICD-10-CM | POA: Diagnosis not present

## 2023-01-21 DIAGNOSIS — M25552 Pain in left hip: Secondary | ICD-10-CM | POA: Diagnosis not present

## 2023-01-21 DIAGNOSIS — R278 Other lack of coordination: Secondary | ICD-10-CM | POA: Diagnosis not present

## 2023-01-24 ENCOUNTER — Telehealth: Payer: Self-pay

## 2023-01-24 ENCOUNTER — Encounter: Payer: Self-pay | Admitting: Internal Medicine

## 2023-01-24 ENCOUNTER — Non-Acute Institutional Stay (SKILLED_NURSING_FACILITY): Payer: Medicare Other | Admitting: Internal Medicine

## 2023-01-24 DIAGNOSIS — E782 Mixed hyperlipidemia: Secondary | ICD-10-CM

## 2023-01-24 DIAGNOSIS — F0150 Vascular dementia without behavioral disturbance: Secondary | ICD-10-CM | POA: Diagnosis not present

## 2023-01-24 DIAGNOSIS — R5383 Other fatigue: Secondary | ICD-10-CM

## 2023-01-24 DIAGNOSIS — E039 Hypothyroidism, unspecified: Secondary | ICD-10-CM

## 2023-01-24 DIAGNOSIS — F321 Major depressive disorder, single episode, moderate: Secondary | ICD-10-CM

## 2023-01-24 DIAGNOSIS — R293 Abnormal posture: Secondary | ICD-10-CM | POA: Diagnosis not present

## 2023-01-24 DIAGNOSIS — M25552 Pain in left hip: Secondary | ICD-10-CM | POA: Diagnosis not present

## 2023-01-24 DIAGNOSIS — R278 Other lack of coordination: Secondary | ICD-10-CM | POA: Diagnosis not present

## 2023-01-24 LAB — COMPREHENSIVE METABOLIC PANEL
Albumin: 3.7 (ref 3.5–5.0)
Calcium: 8.6 — AB (ref 8.7–10.7)
eGFR: 87

## 2023-01-24 LAB — BASIC METABOLIC PANEL
BUN: 12 (ref 4–21)
CO2: 25 — AB (ref 13–22)
Chloride: 100 (ref 99–108)
Creatinine: 0.7 (ref 0.5–1.1)
Glucose: 105
Potassium: 4.4 mEq/L (ref 3.5–5.1)
Sodium: 136 — AB (ref 137–147)

## 2023-01-24 LAB — HEPATIC FUNCTION PANEL
ALT: 36 U/L — AB (ref 7–35)
AST: 27 (ref 13–35)
Alkaline Phosphatase: 167 — AB (ref 25–125)
Bilirubin, Total: 0.6

## 2023-01-24 LAB — CBC AND DIFFERENTIAL
HCT: 38 (ref 36–46)
Hemoglobin: 13.1 (ref 12.0–16.0)
Neutrophils Absolute: 7.1
Platelets: 341 10*3/uL (ref 150–400)
WBC: 9.3

## 2023-01-24 LAB — CBC: RBC: 4.11 (ref 3.87–5.11)

## 2023-01-24 NOTE — Telephone Encounter (Signed)
Patient's son called with concerns about patient's change in status and he would liek to discuss it.  Message routed to Dr. Einar Crow

## 2023-01-24 NOTE — Telephone Encounter (Signed)
Talked to him

## 2023-01-24 NOTE — Progress Notes (Unsigned)
Location: [facility] Room Number: 146A Place of Service:  SNF (31)  Provider:   Code Status: DNR Goals of Care:     01/24/2023    4:33 PM  Advanced Directives  Does Patient Have a Medical Advance Directive? Yes  Type of Estate agent of [place];Living will;Out of facility DNR (pink MOST or yellow form)  Does patient want to make changes to medical advance directive? No - Patient declined  Copy of Healthcare Power of Attorney in Chart? Yes - validated most recent copy scanned in chart (See row information)  Pre-existing out of facility DNR order (yellow form or pink MOST form) Yellow form placed in chart (order not valid for inpatient use)     Chief Complaint  Patient presents with   Acute Visit    Patient is being seen for an acute visit    Health Maintenance    Patient is due for an AWV and also an updated covid vaccine     HPI: Patient is a 82 y.o. female seen today for an acute visit for Lethargy  Lives in SNF in [place] Patient has h/o  Vascular dementia Worsening Cognition Recently patient has been more cognitive issues She is less responsive and Poor Appetite She was started on Wellbutrin on 04/22 for this But since then nurses think she is more lethargic  Talked to the son and he has also noticed that. Sits and stares  Needing lot of Cueing  She has been already [place] dependent  Today she was just sitting in wheelchair and closes her eyes and sometimes moan  She also has history  S/p CVA in 2018  Essential Tremor, Anxiety, Brachial Plexux Palsy, Chronic Pain in her Left Knee, Hypothyroidism, Hyperlipidemia,  S/P L4-5, L5-S1 decompressive laminectomy with foraminotomies due to Severe Back pain Done on 06/08/21  Past Medical History:  Diagnosis Date   Anxiety    Arthritis    Biceps tendonitis 01/05/2013   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   Cerebral infarction [facility])    CTS (carpal tunnel syndrome) 1995    Degenerative disc disease, cervical    Dementia (HCC)    DJD (degenerative joint disease) 2016   DDD with spinal stenosis. Street of back injections by Dr. Murray Hodgkins 2016 with good relief, history of cervical DD with possiblity of surgery by Dr. Dutch Quint.   GERD (gastroesophageal reflux disease)    IBS with moderate refluc seen on upper GI study form January 2011 Dr. Ewing Schlein   HTN (hypertension)    no meds   Hyperlipidemia    Hypothyroidism    IBS (irritable bowel syndrome)    Imbalance    Impingement syndrome of right shoulder 01/05/2013   Mass of left side of neck    Memory loss    Mood disorder (HCC)    MVP (mitral valve prolapse)    Hx of    Osteopenia    Peripheral edema    Right rotator cuff tear 01/05/2013   Sciatic pain    recurrent   Seasonal allergies    Stroke [facility])    Tension headache    TIA (transient ischemic attack)    Tremor     Past Surgical History:  Procedure Laterality Date   ANTERIOR CERVICAL DECOMP/DISCECTOMY FUSION  04/2015    Dr. Rise Mu, [facility]   CARDIAC CATHETERIZATION  2004   which revealed smooth and normal coronary arteries   CARPAL TUNNEL RELEASE  2000   lt   CATARACT EXTRACTION W/  INTRAOCULAR LENS  IMPLANT, BILATERAL  1/17   Dr. Delaney Meigs   COSMETIC SURGERY  2010   eyes-facial   DENTAL SURGERY  1960   LASIK     LOOP RECORDER INSERTION N/A 09/22/2017   Procedure: LOOP RECORDER INSERTION;  Surgeon: Regan Lemming, MD;  Location: [facility] INVASIVE CV LAB;  Service: Cardiovascular;  Laterality: N/A;   LUMBAR LAMINECTOMY/DECOMPRESSION MICRODISCECTOMY N/A 06/08/2021   Procedure: Laminectomy and Foraminotomy - L4-L5, L5-S1;  Surgeon: Julio Sicks, MD;  Location: [facility] OR;  Service: Neurosurgery;  Laterality: N/A;   SHOULDER ARTHROSCOPY WITH ROTATOR CUFF REPAIR Right 01/05/2013   Procedure: SHOULDER ARTHROSCOPY WITH ARTHROSCOPIC  ROTATOR CUFF REPAIR, ACROMIOPLASTY, EXTENSIVE DEBRIDEMENT;  Surgeon: Eulas Post, MD;  Location: [facility];   Service: Orthopedics;  Laterality: Right;   TEE WITHOUT CARDIOVERSION N/A 09/22/2017   Procedure: TRANSESOPHAGEAL ECHOCARDIOGRAM (TEE);  Surgeon: Thurmon Fair, MD;  Location: [facility] ENDOSCOPY;  Service: Cardiovascular;  Laterality: N/A;   TONSILLECTOMY     TUBAL LIGATION      Allergies  Allergen Reactions   Fluticasone Other (See Comments)    DRY EYES   Aciphex [Rabeprazole] Other (See Comments)    Critical    Codeine Nausea Only   Codeine Other (See Comments)    unknown   Cymbalta [Duloxetine Hcl] Other (See Comments)    Critical    Lexapro [Escitalopram] Other (See Comments)    critical   Lipitor [Atorvastatin Calcium] Other (See Comments)    Intolerant    Morphine Sulfate     Per matrix   Pneumovax [Pneumococcal Polysaccharide Vaccine] Other (See Comments)    moderate   Prevacid [Lansoprazole] Other (See Comments)    Unknown, listed on MAR   Rosuvastatin Other (See Comments)    Intolerant    Sertraline Other (See Comments)    critical   Statins Other (See Comments)    Critical    Sulfa Antibiotics Nausea And Vomiting   Tetracyclines & Related Other (See Comments)    Morphine Sulfate   Welchol [Colesevelam] Other (See Comments)    Unknown, listed on MAR   Pneumovax 23 [Pneumococcal Vac Polyvalent] Rash    Injection site reaction    Outpatient Encounter Medications as of 01/24/2023  Medication Sig   acetaminophen (TYLENOL) 325 MG tablet Take 650 mg by mouth 2 (two) times daily as needed.   acetaminophen (TYLENOL) 325 MG tablet Take 650 mg by mouth 3 (three) times daily.   Calcium Carb-Cholecalciferol (CALCIUM 500 + D PO) Take 1 tablet by mouth daily.   calcium carbonate (TUMS - DOSED IN MG ELEMENTAL CALCIUM) 500 MG chewable tablet Chew 2 tablets by mouth 2 (two) times daily as needed for indigestion or heartburn.   clonazePAM (KLONOPIN) 0.5 MG tablet Take 0.5 tablets (0.25 mg total) by mouth at bedtime. (Patient taking differently: Take 0.25 mg by mouth at bedtime as  needed.)   clopidogrel (PLAVIX) 75 MG tablet Take 1 tablet (75 mg total) by mouth daily.   diclofenac Sodium (VOLTAREN) 1 % GEL Apply 2 g topically 2 (two) times daily as needed (pain).   donepezil (ARICEPT) 10 MG tablet Take 1 tablet (10 mg total) by mouth daily.   gabapentin (NEURONTIN) 100 MG capsule Take 200 mg by mouth 3 (three) times daily.   levothyroxine (SYNTHROID) 88 MCG tablet Take 88 mcg by mouth daily before breakfast.   lidocaine 4 % Place 1 patch onto the skin in the morning and at bedtime.   melatonin 5 MG TABS  Take 5 mg by mouth at bedtime as needed (sleep).   memantine (NAMENDA) 5 MG tablet Take 5 mg by mouth 2 (two) times daily.   mirabegron ER (MYRBETRIQ) 25 MG TB24 tablet Take 1 tablet (25 mg total) by mouth daily.   Multiple Vitamins-Minerals (PRESERVISION AREDS PO) Take 1 tablet by mouth 2 (two) times daily.    omeprazole (PRILOSEC) 40 MG capsule Take 40 mg by mouth daily.   polyethylene glycol (MIRALAX) 17 g packet Take 17 g by mouth daily.   propranolol (INDERAL) 20 MG tablet Take 20 mg by mouth 2 (two) times daily. 1 tablet (20 mg) oral, Twice A Day   rosuvastatin (CRESTOR) 5 MG tablet Take 1 tablet (5 mg total) by mouth daily.   [DISCONTINUED] buPROPion (WELLBUTRIN) 75 MG tablet Take 1 tablet (75 mg total) by mouth 2 (two) times daily.   [DISCONTINUED] HYDROcodone-acetaminophen (NORCO/VICODIN) 5-325 MG tablet Take 1-2 tablets by mouth every 4 (four) hours as needed for moderate pain. 1 tablet for moderate pain or 2 tablets for severe pain   No facility-administered encounter medications on file as of 01/24/2023.    Review of Systems:  Review of Systems  Constitutional:  Positive for activity change and appetite change.  HENT: Negative.    Respiratory:  Negative for cough and shortness of breath.   Cardiovascular:  Negative for leg swelling.  Gastrointestinal:  Negative for constipation.  Genitourinary: Negative.   Musculoskeletal:  Positive for gait problem and  myalgias. Negative for arthralgias.  Skin: Negative.   Neurological:  Positive for weakness. Negative for dizziness.  Psychiatric/Behavioral:  Positive for confusion and dysphoric mood. Negative for sleep disturbance.     Health Maintenance  Topic Date Due   COVID-19 Vaccine (6 - 2023-24 season) 05/28/2022   Medicare Annual Wellness (AWV)  02/05/2023   INFLUENZA VACCINE  04/28/2023   DTaP/Tdap/Td (6 - Td or Tdap) 10/18/2023   Pneumonia Vaccine 33+ Years old  Completed   DEXA SCAN  Completed   Zoster Vaccines- Shingrix  Completed   HPV VACCINES  Aged Out    Physical Exam: Vitals:   01/24/23 1631  BP: (!) 147/82  Pulse: 70  Resp: 20  Temp: (!) 97.3 F (36.3 C)  TempSrc: Temporal  SpO2: 93%  Weight: 153 lb 6.4 oz (69.6 kg)  Height: 5\' 1"  (1.549 m)   Body mass index is 28.98 kg/m. Physical Exam Vitals reviewed.  Constitutional:      Appearance: Normal appearance.  HENT:     Head: Normocephalic.     Nose: Nose normal.     Mouth/Throat:     Mouth: Mucous membranes are moist.     Pharynx: Oropharynx is clear.  Eyes:     Pupils: Pupils are equal, round, and reactive to light.  Cardiovascular:     Rate and Rhythm: Normal rate and regular rhythm.     Pulses: Normal pulses.     Heart sounds: Normal heart sounds. No murmur heard. Pulmonary:     Effort: Pulmonary effort is normal.     Breath sounds: Normal breath sounds.  Abdominal:     General: Abdomen is flat. Bowel sounds are normal.     Palpations: Abdomen is soft.  Musculoskeletal:        General: No swelling.     Cervical back: Neck supple.  Skin:    General: Skin is warm.  Neurological:     General: No focal deficit present.     Mental Status: She is alert.  Comments: Sleepy Does respond and Follow some commands Does have Aphasia  Psychiatric:        Mood and Affect: Mood normal.        Thought Content: Thought content normal.     Labs reviewed: Basic Metabolic Panel: Recent Labs     03/23/22 0000 07/20/22 0000 08/07/22 1122 08/07/22 1615 08/07/22 2343 08/16/22 0000 08/16/22 1300 12/22/22 1850  NA 142   < > 141  --    < > 142 142 138  K 4.3   < > 4.2  --    < > 4.1 4.1 4.1  CL 105   < > 104  --   --  104 104 103  CO2 24*   < > 29  --   --  23* 23* 25  GLUCOSE  --   --  99  --   --   --   --  108*  BUN 12   < > 14  --   --  10 10 11   CREATININE 0.7   < > 0.95  --   --  0.7 0.7 0.76  CALCIUM 9.4   < > 9.2  --   --  9.3 9.3 8.6*  MG  --   --   --  2.2  --   --   --   --   PHOS  --   --   --  4.0  --   --   --   --   TSH 1.55  --   --  1.147  --   --   --   --    < > = values in this interval not displayed.   Liver Function Tests: Recent Labs    08/07/22 1615 08/16/22 0000 08/16/22 1300 12/22/22 1850  AST 20 16 16 16   ALT 14 14 14 21   ALKPHOS 69 100 100 109  BILITOT 0.5  --   --  0.8  PROT 6.8  --   --  6.9  ALBUMIN 3.6 4.0 4.0 3.1*   No results for input(s): "LIPASE", "AMYLASE" in the last 8760 hours. No results for input(s): "AMMONIA" in the last 8760 hours. CBC: Recent Labs    08/07/22 1122 08/07/22 2343 08/18/22 0000 12/22/22 1850  WBC 9.9  --  7.2 9.7  NEUTROABS 7.5  --   --   --   HGB 14.1 12.6 13.5 13.4  HCT 43.4 37.0 41 41.3  MCV 98.9  --   --  96.9  PLT 301  --  298 350   Lipid Panel: Recent Labs    03/23/22 0000  CHOL 174  HDL 66  LDLCALC 81  TRIG 134   Lab Results  Component Value Date   HGBA1C 5.6 09/21/2017    Procedures since last visit: DG Wrist Complete Right  Result Date: 12/30/2022 CLINICAL DATA:  Right wrist pain. Soft tissue swelling. No recalled specific injury. EXAM: RIGHT WRIST - COMPLETE 3+ VIEW COMPARISON:  None Available. FINDINGS: No fracture. No bone lesion. Skeletal structures are demineralized. Mild narrowing of the trapezium first metacarpal articulation with small marginal osteophytes. Remaining joint spaces are well preserved. 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 There  is also calcification over the posterior aspect of the wrists, smaller areas seen along the volar aspect, suspected to be synovial. Surrounding soft tissue swelling most evident dorsally. IMPRESSION: 1. No fracture or dislocation. 2. Mild trapezium first metacarpal articulation osteoarthritis. Remaining joints normally spaced and  aligned. 3. Evidence of synovial chondrocalcinosis with surrounding soft tissue swelling consistent with synovitis, etiology unclear. Electronically Signed   By: Amie Portland M.D.   On: 12/30/2022 15:06    Assessment/Plan 1. Lethargy/ Fluctuating Mental status Not sure this is Acute Seems more Progressive of her disease. CT of head in 03/24 had shown decreased attenuation are noted throughout the deep and periventricular white matter of the cerebral hemispheres bilaterally, compatible with chronic microvascular ischemic disease ? Worsening Cognition / Depressoin Seems to have gotten worse since started on Wellbutrin Wil discontinue wellbutrin Also Discontinue Norco and try  Tylenol PRN fo rpain Made Klonipin 0.25 mg QHS PRN D/W Son who open to Hospice if no change   Cbc,CMP to rule out infection Addendum Labs came back Normal Alk phos was slightly Elevated at 167 but LFTS normal Will Follow  2. Current moderate episode of major depressive disorder without prior episode (HCC) Discontinue Wellbutrin for now Patient has been allergic to SSRI   3. Vascular dementia without behavioral disturbance (HCC) Worsening Cognition Needing more Cueing More assisting [place] dependent Poor appetite Consider Hospice if no improvement  4. Hypothyroidism, unspecified type TSH norml in 11/23  5. Mixed hyperlipidemia On statin    Labs/tests ordered:   Next appt:  Visit date not found D/w Son Total time spent in this patient care encounter was  45_  minutes; greater than 50% of the visit spent counseling patient and staff, reviewing records , Labs and coordinating  care for problems addressed at this encounter.

## 2023-01-25 DIAGNOSIS — R293 Abnormal posture: Secondary | ICD-10-CM | POA: Diagnosis not present

## 2023-01-25 DIAGNOSIS — R278 Other lack of coordination: Secondary | ICD-10-CM | POA: Diagnosis not present

## 2023-01-25 DIAGNOSIS — R5383 Other fatigue: Secondary | ICD-10-CM | POA: Diagnosis not present

## 2023-01-25 DIAGNOSIS — M25552 Pain in left hip: Secondary | ICD-10-CM | POA: Diagnosis not present

## 2023-01-25 DIAGNOSIS — M2550 Pain in unspecified joint: Secondary | ICD-10-CM | POA: Diagnosis not present

## 2023-01-25 LAB — BASIC METABOLIC PANEL
BUN: 12 (ref 4–21)
CO2: 24 — AB (ref 13–22)
Chloride: 100 (ref 99–108)
Creatinine: 0.6 (ref 0.5–1.1)
Glucose: 106
Potassium: 4.3 mEq/L (ref 3.5–5.1)
Sodium: 137 (ref 137–147)

## 2023-01-25 LAB — COMPREHENSIVE METABOLIC PANEL
Calcium: 8.6 — AB (ref 8.7–10.7)
eGFR: 89

## 2023-01-25 LAB — TSH: TSH: 0.57 (ref 0.41–5.90)

## 2023-01-27 DIAGNOSIS — R278 Other lack of coordination: Secondary | ICD-10-CM | POA: Diagnosis not present

## 2023-01-28 ENCOUNTER — Encounter: Payer: Self-pay | Admitting: Internal Medicine

## 2023-01-28 ENCOUNTER — Non-Acute Institutional Stay (SKILLED_NURSING_FACILITY): Payer: Medicare Other | Admitting: Internal Medicine

## 2023-01-28 DIAGNOSIS — F0150 Vascular dementia without behavioral disturbance: Secondary | ICD-10-CM

## 2023-01-28 DIAGNOSIS — E782 Mixed hyperlipidemia: Secondary | ICD-10-CM | POA: Diagnosis not present

## 2023-01-28 DIAGNOSIS — R278 Other lack of coordination: Secondary | ICD-10-CM | POA: Diagnosis not present

## 2023-01-28 DIAGNOSIS — R5383 Other fatigue: Secondary | ICD-10-CM | POA: Diagnosis not present

## 2023-01-28 DIAGNOSIS — F321 Major depressive disorder, single episode, moderate: Secondary | ICD-10-CM | POA: Diagnosis not present

## 2023-01-28 DIAGNOSIS — E039 Hypothyroidism, unspecified: Secondary | ICD-10-CM | POA: Diagnosis not present

## 2023-01-28 DIAGNOSIS — M542 Cervicalgia: Secondary | ICD-10-CM

## 2023-01-28 NOTE — Progress Notes (Unsigned)
Location: Medical illustrator of Service:  SNF (31)  Provider:   Code Status: DNR Goals of Care:     01/24/2023    4:33 PM  Advanced Directives  Does Patient Have a Medical Advance Directive? Yes  Type of Estate agent of [place];Living will;Out of facility DNR (pink MOST or yellow form)  Does patient want to make changes to medical advance directive? No - Patient declined  Copy of Healthcare Power of Attorney in Chart? Yes - validated most recent copy scanned in chart (See row information)  Pre-existing out of facility DNR order (yellow form or pink MOST form) Yellow form placed in chart (order not valid for inpatient use)     Chief Complaint  Patient presents with   Acute Visit    HPI: Patient is a 82 y.o. female seen today for an acute visit for Neck pain and Failure to thrive  Lives in [place] in SNF  Patient has h/o  Vascular dementia Worsening Cognition Recently patient has been more cognitive issues She is less responsive and Poor Appetite She was started on Wellbutrin on 04/22 for this But since then nurses think she is more lethargic  So taken off Wellbutrin Also taken off Norco to see if it helps for her to wake up She does seem more awke but per nurses she is now c/o Pain in her Neck back and Her hip esepecially when they are providing her care Son also wants to now consider Hospice Wt Readings from Last 3 Encounters:  01/28/23 149 lb 9.6 oz (67.9 kg)  01/24/23 153 lb 6.4 oz (69.6 kg)  01/17/23 153 lb 6.4 oz (69.6 kg)    She has been already [employer] dependent  Today she was just sitting in wheelchair and closes her eyes and sometimes moan Also c/o Pain in her Neck  Past Medical History:  Diagnosis Date   Anxiety    Arthritis    Biceps tendonitis 01/05/2013   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 Street of back injections by Dr. Murray Hodgkins 2016 with good relief, history of cervical DD with possiblity of surgery by Dr. Dutch Quint.   GERD (gastroesophageal reflux disease)    IBS with moderate refluc seen on upper GI study form January 2011 Dr. Ewing Schlein   HTN (hypertension)    no meds   Hyperlipidemia    Hypothyroidism    IBS (irritable bowel syndrome)    Imbalance    Impingement syndrome of right shoulder 01/05/2013   Mass of left side of neck    Memory loss    Mood disorder (HCC)    MVP (mitral valve prolapse)    Hx of    Osteopenia    Peripheral edema    Right rotator cuff tear 01/05/2013   Sciatic pain    recurrent   Seasonal allergies    Stroke [facility])    Tension headache    TIA (transient ischemic attack)    Tremor     Past Surgical History:  Procedure Laterality Date   ANTERIOR CERVICAL DECOMP/DISCECTOMY FUSION  04/2015    Dr. Rise Mu, [facility]   CARDIAC CATHETERIZATION  2004   which revealed smooth and normal coronary arteries   CARPAL TUNNEL RELEASE  2000   lt   CATARACT  EXTRACTION W/ INTRAOCULAR LENS  IMPLANT, BILATERAL  1/17   Dr. Delaney Meigs   COSMETIC SURGERY  2010   eyes-facial   DENTAL SURGERY  1960   LASIK     LOOP RECORDER INSERTION N/A 09/22/2017   Procedure: LOOP RECORDER INSERTION;  Surgeon: Regan Lemming, MD;  Location: [facility] INVASIVE CV LAB;  Service: Cardiovascular;  Laterality: N/A;   LUMBAR LAMINECTOMY/DECOMPRESSION MICRODISCECTOMY N/A 06/08/2021   Procedure: Laminectomy and Foraminotomy - L4-L5, L5-S1;  Surgeon: Julio Sicks, MD;  Location: [facility] OR;  Service: Neurosurgery;  Laterality: N/A;   SHOULDER ARTHROSCOPY WITH ROTATOR CUFF REPAIR Right 01/05/2013   Procedure: SHOULDER ARTHROSCOPY WITH ARTHROSCOPIC  ROTATOR CUFF REPAIR, ACROMIOPLASTY, EXTENSIVE DEBRIDEMENT;  Surgeon: Eulas Post, MD;  Location: [facility];  Service: Orthopedics;  Laterality: Right;   TEE WITHOUT  CARDIOVERSION N/A 09/22/2017   Procedure: TRANSESOPHAGEAL ECHOCARDIOGRAM (TEE);  Surgeon: Thurmon Fair, MD;  Location: [facility] ENDOSCOPY;  Service: Cardiovascular;  Laterality: N/A;   TONSILLECTOMY     TUBAL LIGATION      Allergies  Allergen Reactions   Fluticasone Other (See Comments)    DRY EYES   Aciphex [Rabeprazole] Other (See Comments)    Critical    Codeine Nausea Only   Codeine Other (See Comments)    unknown   Cymbalta [Duloxetine Hcl] Other (See Comments)    Critical    Lexapro [Escitalopram] Other (See Comments)    critical   Lipitor [Atorvastatin Calcium] Other (See Comments)    Intolerant    Morphine Sulfate     Per matrix   Pneumovax [Pneumococcal Polysaccharide Vaccine] Other (See Comments)    moderate   Prevacid [Lansoprazole] Other (See Comments)    Unknown, listed on MAR   Rosuvastatin Other (See Comments)    Intolerant    Sertraline Other (See Comments)    critical   Statins Other (See Comments)    Critical    Sulfa Antibiotics Nausea And Vomiting   Tetracyclines & Related Other (See Comments)    Morphine Sulfate   Welchol [Colesevelam] Other (See Comments)    Unknown, listed on MAR   Pneumovax 23 [Pneumococcal Vac Polyvalent] Rash    Injection site reaction    Outpatient Encounter Medications as of 01/28/2023  Medication Sig   acetaminophen (TYLENOL) 325 MG tablet Take 650 mg by mouth 2 (two) times daily as needed.   acetaminophen (TYLENOL) 325 MG tablet Take 650 mg by mouth 3 (three) times daily.   Calcium Carb-Cholecalciferol (CALCIUM 500 + D PO) Take 1 tablet by mouth daily.   calcium carbonate (TUMS - DOSED IN MG ELEMENTAL CALCIUM) 500 MG chewable tablet Chew 2 tablets by mouth 2 (two) times daily as needed for indigestion or heartburn.   clonazePAM (KLONOPIN) 0.5 MG tablet Take 0.5 tablets (0.25 mg total) by mouth at bedtime. (Patient taking differently: Take 0.25 mg by mouth at bedtime as needed.)   clopidogrel (PLAVIX) 75 MG tablet Take 1  tablet (75 mg total) by mouth daily.   diclofenac Sodium (VOLTAREN) 1 % GEL Apply 2 g topically 2 (two) times daily as needed (pain).   donepezil (ARICEPT) 10 MG tablet Take 1 tablet (10 mg total) by mouth daily.   gabapentin (NEURONTIN) 100 MG capsule Take 200 mg by mouth 3 (three) times daily.   levothyroxine (SYNTHROID) 88 MCG tablet Take 88 mcg by mouth daily before breakfast.   lidocaine 4 % Place 1 patch onto the skin in the morning and at bedtime.   melatonin 5  MG TABS Take 5 mg by mouth at bedtime as needed (sleep).   memantine (NAMENDA) 5 MG tablet Take 5 mg by mouth 2 (two) times daily.   mirabegron ER (MYRBETRIQ) 25 MG TB24 tablet Take 1 tablet (25 mg total) by mouth daily.   Multiple Vitamins-Minerals (PRESERVISION AREDS PO) Take 1 tablet by mouth 2 (two) times daily.    omeprazole (PRILOSEC) 40 MG capsule Take 40 mg by mouth daily.   polyethylene glycol (MIRALAX) 17 g packet Take 17 g by mouth daily.   propranolol (INDERAL) 20 MG tablet Take 20 mg by mouth 2 (two) times daily. 1 tablet (20 mg) oral, Twice A Day   rosuvastatin (CRESTOR) 5 MG tablet Take 1 tablet (5 mg total) by mouth daily.   No facility-administered encounter medications on file as of 01/28/2023.    Review of Systems:  Review of Systems  Unable to perform ROS: Dementia    Health Maintenance  Topic Date Due   COVID-19 Vaccine (6 - 2023-24 season) 05/28/2022   Medicare Annual Wellness (AWV)  02/05/2023   INFLUENZA VACCINE  04/28/2023   DTaP/Tdap/Td (6 - Td or Tdap) 10/18/2023   Pneumonia Vaccine 29+ Years old  Completed   DEXA SCAN  Completed   Zoster Vaccines- Shingrix  Completed   HPV VACCINES  Aged Out    Physical Exam: Vitals:   01/28/23 1247  BP: 132/66  Pulse: 72  Resp: 13  Temp: 97.8 F (36.6 C)  SpO2: 93%  Weight: 149 lb 9.6 oz (67.9 kg)   Body mass index is 28.27 kg/m. Physical Exam Vitals reviewed.  Constitutional:      Appearance: Normal appearance.  HENT:     Head:  Normocephalic.     Nose: Nose normal.     Mouth/Throat:     Mouth: Mucous membranes are moist.     Pharynx: Oropharynx is clear.  Eyes:     Pupils: Pupils are equal, round, and reactive to light.  Cardiovascular:     Rate and Rhythm: Normal rate and regular rhythm.     Pulses: Normal pulses.     Heart sounds: Normal heart sounds. No murmur heard. Pulmonary:     Effort: Pulmonary effort is normal.     Breath sounds: Normal breath sounds.  Abdominal:     General: Abdomen is flat. Bowel sounds are normal.     Palpations: Abdomen is soft.  Musculoskeletal:        General: No swelling.     Cervical back: Neck supple.     Comments: C/o pain when I move her neck   Skin:    General: Skin is warm.  Neurological:     General: No focal deficit present.     Mental Status: She is alert.  Psychiatric:        Mood and Affect: Mood normal.        Thought Content: Thought content normal.     Labs reviewed: Basic Metabolic Panel: Recent Labs    03/23/22 0000 07/20/22 0000 08/07/22 1122 08/07/22 1615 08/07/22 2343 08/16/22 1300 12/22/22 1850 01/24/23 0000  NA 142   < > 141  --    < > 142 138 136*  K 4.3   < > 4.2  --    < > 4.1 4.1 4.4  CL 105   < > 104  --    < > 104 103 100  CO2 24*   < > 29  --    < > 23*  25 25*  GLUCOSE  --   --  99  --   --   --  108*  --   BUN 12   < > 14  --    < > 10 11 12   CREATININE 0.7   < > 0.95  --    < > 0.7 0.76 0.7  CALCIUM 9.4   < > 9.2  --    < > 9.3 8.6* 8.6*  MG  --   --   --  2.2  --   --   --   --   PHOS  --   --   --  4.0  --   --   --   --   TSH 1.55  --   --  1.147  --   --   --   --    < > = values in this interval not displayed.   Liver Function Tests: Recent Labs    08/07/22 1615 08/16/22 0000 08/16/22 1300 12/22/22 1850 01/24/23 0000  AST 20   < > 16 16 27   ALT 14   < > 14 21 36*  ALKPHOS 69   < > 100 109 167*  BILITOT 0.5  --   --  0.8  --   PROT 6.8  --   --  6.9  --   ALBUMIN 3.6   < > 4.0 3.1* 3.7   < > = values in  this interval not displayed.   No results for input(s): "LIPASE", "AMYLASE" in the last 8760 hours. No results for input(s): "AMMONIA" in the last 8760 hours. CBC: Recent Labs    08/07/22 1122 08/07/22 2343 08/18/22 0000 12/22/22 1850 01/24/23 0000  WBC 9.9  --  7.2 9.7 9.3  NEUTROABS 7.5  --   --   --  7.10  HGB 14.1   < > 13.5 13.4 13.1  HCT 43.4   < > 41 41.3 38  MCV 98.9  --   --  96.9  --   PLT 301  --  298 350 341   < > = values in this interval not displayed.   Lipid Panel: Recent Labs    03/23/22 0000  CHOL 174  HDL 66  LDLCALC 81  TRIG 134   Lab Results  Component Value Date   HGBA1C 5.6 09/21/2017    Procedures since last visit: No results found.  Assessment/Plan 1. Neck pain Multilevel moderate degenerative changes on CT scan in 03/24 Son wants her to be more comfortable Will Restart her Norco  2. Vascular dementia without behavioral disturbance (HCC) MRI have shown Chronic Vessel disease She is Cognitively declining Has lost weight  Hospice referral per Family request  3. Current moderate episode of major depressive disorder without prior episode (HCC) Did nto do well with Wellbutrin Will hold off adding any new med  4. Lethargy Labs are normal She is more awake  Labs negative for any cute changes  5. Hypothyroidism, unspecified type TSH normal in 11/23  6. Mixed hyperlipidemia On statin    Labs/tests ordered:  * No order type specified * Next appt:  Visit date not found

## 2023-01-30 DIAGNOSIS — I672 Cerebral atherosclerosis: Secondary | ICD-10-CM | POA: Diagnosis not present

## 2023-01-30 DIAGNOSIS — K219 Gastro-esophageal reflux disease without esophagitis: Secondary | ICD-10-CM | POA: Diagnosis not present

## 2023-01-30 DIAGNOSIS — M48061 Spinal stenosis, lumbar region without neurogenic claudication: Secondary | ICD-10-CM | POA: Diagnosis not present

## 2023-01-30 DIAGNOSIS — E785 Hyperlipidemia, unspecified: Secondary | ICD-10-CM | POA: Diagnosis not present

## 2023-01-30 DIAGNOSIS — I69318 Other symptoms and signs involving cognitive functions following cerebral infarction: Secondary | ICD-10-CM | POA: Diagnosis not present

## 2023-01-30 DIAGNOSIS — F0153 Vascular dementia, unspecified severity, with mood disturbance: Secondary | ICD-10-CM | POA: Diagnosis not present

## 2023-01-30 DIAGNOSIS — I10 Essential (primary) hypertension: Secondary | ICD-10-CM | POA: Diagnosis not present

## 2023-01-30 DIAGNOSIS — Z9181 History of falling: Secondary | ICD-10-CM | POA: Diagnosis not present

## 2023-01-30 DIAGNOSIS — K589 Irritable bowel syndrome without diarrhea: Secondary | ICD-10-CM | POA: Diagnosis not present

## 2023-01-30 DIAGNOSIS — E039 Hypothyroidism, unspecified: Secondary | ICD-10-CM | POA: Diagnosis not present

## 2023-01-30 DIAGNOSIS — R634 Abnormal weight loss: Secondary | ICD-10-CM | POA: Diagnosis not present

## 2023-01-30 DIAGNOSIS — S2243XD Multiple fractures of ribs, bilateral, subsequent encounter for fracture with routine healing: Secondary | ICD-10-CM | POA: Diagnosis not present

## 2023-01-30 DIAGNOSIS — F0154 Vascular dementia, unspecified severity, with anxiety: Secondary | ICD-10-CM | POA: Diagnosis not present

## 2023-01-31 DIAGNOSIS — F0154 Vascular dementia, unspecified severity, with anxiety: Secondary | ICD-10-CM | POA: Diagnosis not present

## 2023-01-31 DIAGNOSIS — I672 Cerebral atherosclerosis: Secondary | ICD-10-CM | POA: Diagnosis not present

## 2023-01-31 DIAGNOSIS — R634 Abnormal weight loss: Secondary | ICD-10-CM | POA: Diagnosis not present

## 2023-01-31 DIAGNOSIS — I69318 Other symptoms and signs involving cognitive functions following cerebral infarction: Secondary | ICD-10-CM | POA: Diagnosis not present

## 2023-01-31 DIAGNOSIS — F0153 Vascular dementia, unspecified severity, with mood disturbance: Secondary | ICD-10-CM | POA: Diagnosis not present

## 2023-01-31 DIAGNOSIS — I10 Essential (primary) hypertension: Secondary | ICD-10-CM | POA: Diagnosis not present

## 2023-02-01 DIAGNOSIS — F0154 Vascular dementia, unspecified severity, with anxiety: Secondary | ICD-10-CM | POA: Diagnosis not present

## 2023-02-01 DIAGNOSIS — I69318 Other symptoms and signs involving cognitive functions following cerebral infarction: Secondary | ICD-10-CM | POA: Diagnosis not present

## 2023-02-01 DIAGNOSIS — I10 Essential (primary) hypertension: Secondary | ICD-10-CM | POA: Diagnosis not present

## 2023-02-01 DIAGNOSIS — I672 Cerebral atherosclerosis: Secondary | ICD-10-CM | POA: Diagnosis not present

## 2023-02-01 DIAGNOSIS — R634 Abnormal weight loss: Secondary | ICD-10-CM | POA: Diagnosis not present

## 2023-02-01 DIAGNOSIS — F0153 Vascular dementia, unspecified severity, with mood disturbance: Secondary | ICD-10-CM | POA: Diagnosis not present

## 2023-02-02 DIAGNOSIS — R634 Abnormal weight loss: Secondary | ICD-10-CM | POA: Diagnosis not present

## 2023-02-02 DIAGNOSIS — I10 Essential (primary) hypertension: Secondary | ICD-10-CM | POA: Diagnosis not present

## 2023-02-02 DIAGNOSIS — F0153 Vascular dementia, unspecified severity, with mood disturbance: Secondary | ICD-10-CM | POA: Diagnosis not present

## 2023-02-02 DIAGNOSIS — I69318 Other symptoms and signs involving cognitive functions following cerebral infarction: Secondary | ICD-10-CM | POA: Diagnosis not present

## 2023-02-02 DIAGNOSIS — F0154 Vascular dementia, unspecified severity, with anxiety: Secondary | ICD-10-CM | POA: Diagnosis not present

## 2023-02-02 DIAGNOSIS — I672 Cerebral atherosclerosis: Secondary | ICD-10-CM | POA: Diagnosis not present

## 2023-02-07 ENCOUNTER — Other Ambulatory Visit: Payer: Self-pay | Admitting: Adult Health

## 2023-02-07 MED ORDER — HYDROCODONE-ACETAMINOPHEN 5-325 MG PO TABS: 1.0000 | ORAL_TABLET | Freq: Four times a day (QID) | ORAL | 0 refills | Status: DC | PRN

## 2023-02-09 DIAGNOSIS — F0153 Vascular dementia, unspecified severity, with mood disturbance: Secondary | ICD-10-CM | POA: Diagnosis not present

## 2023-02-09 DIAGNOSIS — I10 Essential (primary) hypertension: Secondary | ICD-10-CM | POA: Diagnosis not present

## 2023-02-09 DIAGNOSIS — R634 Abnormal weight loss: Secondary | ICD-10-CM | POA: Diagnosis not present

## 2023-02-09 DIAGNOSIS — I672 Cerebral atherosclerosis: Secondary | ICD-10-CM | POA: Diagnosis not present

## 2023-02-09 DIAGNOSIS — F0154 Vascular dementia, unspecified severity, with anxiety: Secondary | ICD-10-CM | POA: Diagnosis not present

## 2023-02-09 DIAGNOSIS — I69318 Other symptoms and signs involving cognitive functions following cerebral infarction: Secondary | ICD-10-CM | POA: Diagnosis not present

## 2023-02-16 DIAGNOSIS — I69318 Other symptoms and signs involving cognitive functions following cerebral infarction: Secondary | ICD-10-CM | POA: Diagnosis not present

## 2023-02-16 DIAGNOSIS — I672 Cerebral atherosclerosis: Secondary | ICD-10-CM | POA: Diagnosis not present

## 2023-02-16 DIAGNOSIS — R634 Abnormal weight loss: Secondary | ICD-10-CM | POA: Diagnosis not present

## 2023-02-16 DIAGNOSIS — I10 Essential (primary) hypertension: Secondary | ICD-10-CM | POA: Diagnosis not present

## 2023-02-16 DIAGNOSIS — F0153 Vascular dementia, unspecified severity, with mood disturbance: Secondary | ICD-10-CM | POA: Diagnosis not present

## 2023-02-16 DIAGNOSIS — F0154 Vascular dementia, unspecified severity, with anxiety: Secondary | ICD-10-CM | POA: Diagnosis not present

## 2023-02-18 ENCOUNTER — Non-Acute Institutional Stay (SKILLED_NURSING_FACILITY): Payer: Medicare Other | Admitting: Adult Health

## 2023-02-18 ENCOUNTER — Encounter: Payer: Self-pay | Admitting: Adult Health

## 2023-02-18 DIAGNOSIS — I10 Essential (primary) hypertension: Secondary | ICD-10-CM | POA: Diagnosis not present

## 2023-02-18 DIAGNOSIS — F411 Generalized anxiety disorder: Secondary | ICD-10-CM

## 2023-02-18 DIAGNOSIS — G8929 Other chronic pain: Secondary | ICD-10-CM | POA: Diagnosis not present

## 2023-02-18 DIAGNOSIS — F0150 Vascular dementia without behavioral disturbance: Secondary | ICD-10-CM | POA: Diagnosis not present

## 2023-02-18 DIAGNOSIS — N3281 Overactive bladder: Secondary | ICD-10-CM | POA: Diagnosis not present

## 2023-02-18 DIAGNOSIS — M5441 Lumbago with sciatica, right side: Secondary | ICD-10-CM | POA: Diagnosis not present

## 2023-02-18 DIAGNOSIS — F419 Anxiety disorder, unspecified: Secondary | ICD-10-CM | POA: Diagnosis not present

## 2023-02-18 DIAGNOSIS — R634 Abnormal weight loss: Secondary | ICD-10-CM

## 2023-02-18 DIAGNOSIS — M5442 Lumbago with sciatica, left side: Secondary | ICD-10-CM

## 2023-02-18 DIAGNOSIS — E039 Hypothyroidism, unspecified: Secondary | ICD-10-CM

## 2023-02-18 DIAGNOSIS — R42 Dizziness and giddiness: Secondary | ICD-10-CM | POA: Diagnosis not present

## 2023-02-18 NOTE — Progress Notes (Unsigned)
Location:  [facility] Room Number: 146A Place of Service:  SNF [phone]) Provider:  Tamsen Roers, MD  Patient Care Team: Mahlon Gammon, MD as PCP - General (Internal Medicine) Nahser, Deloris Ping, MD as PCP - Cardiology (Cardiology) Vida Rigger, MD as Consulting Physician (Gastroenterology) Levert Feinstein, MD as Consulting Physician (Neurology) Barnett Abu, MD as Consulting Physician (Neurosurgery) [place], Amy, MD as Consulting Physician (Dermatology) Jerene Bears, MD as Consulting Physician (Gynecology) [place], Heather, OD (Optometry) Mahlon Gammon, MD (Internal Medicine)  Extended Emergency Contact Information Primary Emergency Contact: Mazor,Robert J Address: [address]          [place], [place] [address] [place] Home Phone: [phone] Work Phone: [phone] Mobile Phone: [phone] Relation: Spouse Secondary Emergency Contact: [place] Home Phone: [phone] Mobile Phone: [phone] Relation: Son  Code Status:  DNR Hospice Goals of care: Advanced Directive information    02/18/2023    9:36 AM  Advanced Directives  Does Patient Have a Medical Advance Directive? Yes  Type of Estate agent of [place];Living will;Out of facility DNR (pink MOST or yellow form)  Does patient want to make changes to medical advance directive? No - Patient declined  Copy of Healthcare Power of Attorney in Chart? Yes - validated most recent copy scanned in chart (See row information)  Pre-existing out of facility DNR order (yellow form or pink MOST form) Yellow form placed in chart (order not valid for inpatient use)     Chief Complaint  Patient presents with   Medical Management of Chronic Issues    Patient is being seen for a routine visit     HPI:  Pt is a 82 y.o. female seen today for medical management of chronic diseases.   Enrolled in hospice  care due to progressive dementia and chronic pain related issues. She is also losing weight and has lost 11 lbs in the past three months. Currently ordered chocolate boost bid. No dysphagia current on a regular diet. Needs assistance with all ADLs. Able to communicate needs but needs cuing and prompting. [place] for transfers.   Vascular dementia: Hx of TIA on plavix, on aricept and Namenda. No aggression, very pleasant. Has severe anxiety with a long term hx of this. Off wellbutrin due to lethargy.   Has chronic hip and back pain. Norco ordered prn. Only used one time in the past two weeks.   The staff report she feels dizzy after taking morning meds. Currently is on neurontin tid.   Her anxiety seems fairly well controlled. Sparing use of prn clonazepam.    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 Street of back injections by Dr. Murray Hodgkins 2016 with good relief, history of cervical DD with possiblity of surgery by Dr. Dutch Quint.   GERD (gastroesophageal reflux disease)    IBS with moderate refluc seen on upper GI study form January 2011 Dr. Ewing Schlein   HTN (hypertension)    no meds   Hyperlipidemia    Hypothyroidism    IBS (irritable bowel syndrome)    Imbalance    Impingement syndrome of  right shoulder 01/05/2013   Mass of left side of neck    Memory loss    Mood disorder (HCC)    MVP (mitral valve prolapse)    Hx of    Osteopenia    Peripheral edema    Right rotator cuff tear 01/05/2013   Sciatic pain    recurrent   Seasonal allergies    Stroke [facility])    Tension headache    TIA (transient ischemic attack)    Tremor    Past Surgical History:  Procedure Laterality Date   ANTERIOR CERVICAL DECOMP/DISCECTOMY  FUSION  04/2015    Dr. Rise Mu, [facility]   CARDIAC CATHETERIZATION  2004   which revealed smooth and normal coronary arteries   CARPAL TUNNEL RELEASE  2000   lt   CATARACT EXTRACTION W/ INTRAOCULAR LENS  IMPLANT, BILATERAL  1/17   Dr. Delaney Meigs   COSMETIC SURGERY  2010   eyes-facial   DENTAL SURGERY  1960   LASIK     LOOP RECORDER INSERTION N/A 09/22/2017   Procedure: LOOP RECORDER INSERTION;  Surgeon: Regan Lemming, MD;  Location: [facility] INVASIVE CV LAB;  Service: Cardiovascular;  Laterality: N/A;   LUMBAR LAMINECTOMY/DECOMPRESSION MICRODISCECTOMY N/A 06/08/2021   Procedure: Laminectomy and Foraminotomy - L4-L5, L5-S1;  Surgeon: Julio Sicks, MD;  Location: [facility] OR;  Service: Neurosurgery;  Laterality: N/A;   SHOULDER ARTHROSCOPY WITH ROTATOR CUFF REPAIR Right 01/05/2013   Procedure: SHOULDER ARTHROSCOPY WITH ARTHROSCOPIC  ROTATOR CUFF REPAIR, ACROMIOPLASTY, EXTENSIVE DEBRIDEMENT;  Surgeon: Eulas Post, MD;  Location: [facility];  Service: Orthopedics;  Laterality: Right;   TEE WITHOUT CARDIOVERSION N/A 09/22/2017   Procedure: TRANSESOPHAGEAL ECHOCARDIOGRAM (TEE);  Surgeon: Thurmon Fair, MD;  Location: [facility] ENDOSCOPY;  Service: Cardiovascular;  Laterality: N/A;   TONSILLECTOMY     TUBAL LIGATION      Allergies  Allergen Reactions   Fluticasone Other (See Comments)    DRY EYES   Aciphex [Rabeprazole] Other (See Comments)    Critical    Codeine Nausea Only   Codeine Other (See Comments)    unknown   Cymbalta [Duloxetine Hcl] Other (See Comments)    Critical    Lexapro [Escitalopram] Other (See Comments)    critical   Lipitor [Atorvastatin Calcium] Other (See Comments)    Intolerant    Morphine Sulfate     Per matrix   Pneumovax [Pneumococcal Polysaccharide Vaccine] Other (See Comments)    moderate   Prevacid [Lansoprazole] Other (See Comments)    Unknown, listed on MAR   Rosuvastatin Other (See Comments)    Intolerant    Sertraline Other (See Comments)     critical   Statins Other (See Comments)    Critical    Sulfa Antibiotics Nausea And Vomiting   Tetracyclines & Related Other (See Comments)    Morphine Sulfate   Welchol [Colesevelam] Other (See Comments)    Unknown, listed on MAR   Pneumovax 23 [Pneumococcal Vac Polyvalent] Rash    Injection site reaction    Outpatient Encounter Medications as of 02/18/2023  Medication Sig   acetaminophen (TYLENOL) 325 MG tablet Take 650 mg by mouth 2 (two) times daily as needed.   acetaminophen (TYLENOL) 325 MG tablet Take 650 mg by mouth 3 (three) times daily.   Calcium Carb-Cholecalciferol (CALCIUM 500 + D PO) Take 1 tablet by mouth daily.   calcium carbonate (TUMS - DOSED IN MG ELEMENTAL CALCIUM) 500 MG chewable tablet Chew 2 tablets by mouth 2 (two) times daily as needed  for indigestion or heartburn.   clonazePAM (KLONOPIN) 0.5 MG tablet Take 0.5 tablets (0.25 mg total) by mouth at bedtime. (Patient taking differently: Take 0.25 mg by mouth at bedtime as needed.)   clopidogrel (PLAVIX) 75 MG tablet Take 1 tablet (75 mg total) by mouth daily.   diclofenac Sodium (VOLTAREN) 1 % GEL Apply 2 g topically 2 (two) times daily as needed (pain).   donepezil (ARICEPT) 10 MG tablet Take 1 tablet (10 mg total) by mouth daily.   gabapentin (NEURONTIN) 100 MG capsule Take 200 mg by mouth 3 (three) times daily.   HYDROcodone-acetaminophen (NORCO/VICODIN) 5-325 MG tablet Take 1 tablet by mouth every 6 (six) hours as needed for moderate pain.   levothyroxine (SYNTHROID) 88 MCG tablet Take 88 mcg by mouth daily before breakfast.   lidocaine 4 % Place 1 patch onto the skin in the morning and at bedtime.   melatonin 5 MG TABS Take 5 mg by mouth at bedtime as needed (sleep).   memantine (NAMENDA) 5 MG tablet Take 5 mg by mouth 2 (two) times daily.   mirabegron ER (MYRBETRIQ) 25 MG TB24 tablet Take 1 tablet (25 mg total) by mouth daily.   Multiple Vitamins-Minerals (PRESERVISION AREDS PO) Take 1 tablet by mouth 2  (two) times daily.    omeprazole (PRILOSEC) 40 MG capsule Take 40 mg by mouth daily.   polyethylene glycol (MIRALAX) 17 g packet Take 17 g by mouth daily.   propranolol (INDERAL) 20 MG tablet Take 20 mg by mouth 2 (two) times daily. 1 tablet (20 mg) oral, Twice A Day   rosuvastatin (CRESTOR) 5 MG tablet Take 1 tablet (5 mg total) by mouth daily.   No facility-administered encounter medications on file as of 02/18/2023.    Review of Systems  Constitutional:  Positive for activity change, fatigue and unexpected weight change. Negative for appetite change, chills, diaphoresis and fever.  HENT:  Negative for congestion.   Respiratory:  Negative for cough, shortness of breath and wheezing.   Cardiovascular:  Negative for chest pain, palpitations and leg swelling.  Gastrointestinal:  Negative for abdominal distention, abdominal pain, constipation and diarrhea.  Genitourinary:  Negative for difficulty urinating and dysuria.  Musculoskeletal:  Positive for arthralgias, back pain and gait problem. Negative for joint swelling and myalgias.  Neurological:  Positive for dizziness. Negative for tremors, seizures, syncope, facial asymmetry, speech difficulty, weakness, light-headedness, numbness and headaches.  Psychiatric/Behavioral:  Positive for confusion. Negative for agitation and behavioral problems.     Immunization History  Administered Date(s) Administered   Fluad Quad(high Dose 65+) 07/02/2022   Influenza, High Dose Seasonal PF 07/23/2017, 07/03/2019   Influenza-Unspecified 05/27/2010, 08/03/2010, 07/22/2011, 07/01/2012, 06/22/2013, 07/18/2020, 07/11/2021, 07/02/2022   Janssen (J&J) SARS-COV-2 Vaccination 11/06/2019   Moderna SARS-COV2 Booster Vaccination 08/05/2021   Moderna Sars-Covid-2 Vaccination 08/07/2020   PFIZER(Purple Top)SARS-COV-2 Vaccination 10/09/2019, 08/07/2020   Pfizer Covid-19 Vaccine Bivalent Booster 30yrs & up 01/21/2023   Pneumococcal Conjugate-13 02/08/2015    Pneumococcal Polysaccharide-23 05/28/2006, 10/13/2011   Pneumococcal-Unspecified 10/17/2013   Td 03/29/2002   Tdap 09/27/2001, 04/25/2002, 08/18/2012, 10/17/2013   Zoster Recombinat (Shingrix) 05/23/2021, 07/23/2021   Zoster, Live 08/30/2006   Zoster, Unspecified 08/30/2006   Pertinent  Health Maintenance Due  Topic Date Due   INFLUENZA VACCINE  04/28/2023   DEXA SCAN  Completed      08/07/2022   11:17 AM 08/07/2022   11:03 PM 08/08/2022    8:00 PM 08/09/2022    8:00 AM 08/17/2022   10:59 AM  Fall Risk  Falls in the past year?     1  Was there an injury with Fall?     1  Fall Risk Category Calculator     3  Fall Risk Category (Retired)     High  (RETIRED) Patient Fall Risk Level High fall risk High fall risk High fall risk High fall risk High fall risk  Patient at Risk for Falls Due to     History of fall(s);Impaired balance/gait;Impaired mobility  Fall risk Follow up     Falls evaluation completed   Functional Status Survey:    Vitals:   02/18/23 0931  BP: 118/60  Pulse: 71  Resp: 20  Temp: 97.7 F (36.5 C)  TempSrc: Temporal  SpO2: 96%  Weight: 146 lb (66.2 kg)  Height: 5\' 1"  (1.549 m)   Body mass index is 27.59 kg/m. Physical Exam Vitals and nursing note reviewed.  Constitutional:      General: She is not in acute distress.    Appearance: She is not diaphoretic.  HENT:     Head: Normocephalic and atraumatic.     Right Ear: Tympanic membrane normal.     Left Ear: Tympanic membrane normal.     Nose: Nose normal.     Mouth/Throat:     Mouth: Mucous membranes are moist.     Pharynx: Oropharynx is clear.  Eyes:     Extraocular Movements: Extraocular movements intact.     Conjunctiva/sclera: Conjunctivae normal.     Pupils: Pupils are equal, round, and reactive to light.  Neck:     Vascular: No JVD.  Cardiovascular:     Rate and Rhythm: Normal rate and regular rhythm.     Heart sounds: No murmur heard. Pulmonary:     Effort: Pulmonary effort is  normal. No respiratory distress.     Breath sounds: Normal breath sounds. No wheezing.  Abdominal:     General: Bowel sounds are normal. There is no distension.     Palpations: Abdomen is soft.     Tenderness: There is no abdominal tenderness.  Skin:    General: Skin is warm and dry.  Neurological:     General: No focal deficit present.     Mental Status: She is alert. Mental status is at baseline.  Psychiatric:        Mood and Affect: Mood normal.     Labs reviewed: Recent Labs    08/07/22 1122 08/07/22 1615 08/07/22 2343 08/16/22 1300 12/22/22 1850 01/24/23 0000  NA 141  --    < > 142 138 136*  K 4.2  --    < > 4.1 4.1 4.4  CL 104  --    < > 104 103 100  CO2 29  --    < > 23* 25 25*  GLUCOSE 99  --   --   --  108*  --   BUN 14  --    < > 10 11 12   CREATININE 0.95  --    < > 0.7 0.76 0.7  CALCIUM 9.2  --    < > 9.3 8.6* 8.6*  MG  --  2.2  --   --   --   --   PHOS  --  4.0  --   --   --   --    < > = values in this interval not displayed.   Recent Labs    08/07/22 1615 08/16/22 0000 08/16/22 1300 12/22/22 1850 01/24/23 0000  AST 20   < > 16 16 27   ALT 14   < > 14 21 36*  ALKPHOS 69   < > 100 109 167*  BILITOT 0.5  --   --  0.8  --   PROT 6.8  --   --  6.9  --   ALBUMIN 3.6   < > 4.0 3.1* 3.7   < > = values in this interval not displayed.   Recent Labs    08/07/22 1122 08/07/22 2343 08/18/22 0000 12/22/22 1850 01/24/23 0000  WBC 9.9  --  7.2 9.7 9.3  NEUTROABS 7.5  --   --   --  7.10  HGB 14.1   < > 13.5 13.4 13.1  HCT 43.4   < > 41 41.3 38  MCV 98.9  --   --  96.9  --   PLT 301  --  298 350 341   < > = values in this interval not displayed.   Lab Results  Component Value Date   TSH 1.147 08/07/2022   Lab Results  Component Value Date   HGBA1C 5.6 09/21/2017   Lab Results  Component Value Date   CHOL 174 03/23/2022   HDL 66 03/23/2022   LDLCALC 81 03/23/2022   TRIG 134 03/23/2022   CHOLHDL 2.1 09/21/2017    Significant Diagnostic  Results in last 30 days:  No results found.  Assessment/Plan  1. Dizziness Vitals are ok Seems to occur after morning meds Will reduce neurontin to 100 mg tid   2. Chronic bilateral low back pain with bilateral sciatica Her pain seems fairly well controlled now with prn norco and scheduled tylenol  See above   3. Vascular dementia without behavioral disturbance (HCC) Progressive decline in cognition and physical function c/w the disease. Continue supportive care in the skilled environment. Continue namenda and aricept. Could consider discontinuing aricept if weight continues to drop  4. Generalized anxiety disorder Continue prn clonazepam  5. Essential hypertension Controlled   6. Hypothyroidism, unspecified type Lab Results  Component Value Date   TSH 1.147 08/07/2022   Continue Synthroid   7. Overactive bladder Continue myrbetriq  8. Weight loss Continue boost Has progressive dementia and mood related issues  9. Tremor Continue propranolol  Labs/tests ordered:  NA

## 2023-02-22 ENCOUNTER — Encounter: Payer: Self-pay | Admitting: Adult Health

## 2023-02-22 MED ORDER — CLONAZEPAM 0.5 MG PO TABS: 0.2500 mg | ORAL_TABLET | Freq: Every evening | ORAL | 0 refills | Status: DC | PRN

## 2023-02-24 DIAGNOSIS — F0154 Vascular dementia, unspecified severity, with anxiety: Secondary | ICD-10-CM | POA: Diagnosis not present

## 2023-02-24 DIAGNOSIS — I672 Cerebral atherosclerosis: Secondary | ICD-10-CM | POA: Diagnosis not present

## 2023-02-24 DIAGNOSIS — R634 Abnormal weight loss: Secondary | ICD-10-CM | POA: Diagnosis not present

## 2023-02-24 DIAGNOSIS — F0153 Vascular dementia, unspecified severity, with mood disturbance: Secondary | ICD-10-CM | POA: Diagnosis not present

## 2023-02-24 DIAGNOSIS — I10 Essential (primary) hypertension: Secondary | ICD-10-CM | POA: Diagnosis not present

## 2023-02-24 DIAGNOSIS — I69318 Other symptoms and signs involving cognitive functions following cerebral infarction: Secondary | ICD-10-CM | POA: Diagnosis not present

## 2023-02-26 DIAGNOSIS — S2243XD Multiple fractures of ribs, bilateral, subsequent encounter for fracture with routine healing: Secondary | ICD-10-CM | POA: Diagnosis not present

## 2023-02-26 DIAGNOSIS — F0154 Vascular dementia, unspecified severity, with anxiety: Secondary | ICD-10-CM | POA: Diagnosis not present

## 2023-02-26 DIAGNOSIS — K589 Irritable bowel syndrome without diarrhea: Secondary | ICD-10-CM | POA: Diagnosis not present

## 2023-02-26 DIAGNOSIS — M48061 Spinal stenosis, lumbar region without neurogenic claudication: Secondary | ICD-10-CM | POA: Diagnosis not present

## 2023-02-26 DIAGNOSIS — E785 Hyperlipidemia, unspecified: Secondary | ICD-10-CM | POA: Diagnosis not present

## 2023-02-26 DIAGNOSIS — Z9181 History of falling: Secondary | ICD-10-CM | POA: Diagnosis not present

## 2023-02-26 DIAGNOSIS — I672 Cerebral atherosclerosis: Secondary | ICD-10-CM | POA: Diagnosis not present

## 2023-02-26 DIAGNOSIS — E039 Hypothyroidism, unspecified: Secondary | ICD-10-CM | POA: Diagnosis not present

## 2023-02-26 DIAGNOSIS — K219 Gastro-esophageal reflux disease without esophagitis: Secondary | ICD-10-CM | POA: Diagnosis not present

## 2023-02-26 DIAGNOSIS — R634 Abnormal weight loss: Secondary | ICD-10-CM | POA: Diagnosis not present

## 2023-02-26 DIAGNOSIS — I69318 Other symptoms and signs involving cognitive functions following cerebral infarction: Secondary | ICD-10-CM | POA: Diagnosis not present

## 2023-02-26 DIAGNOSIS — I10 Essential (primary) hypertension: Secondary | ICD-10-CM | POA: Diagnosis not present

## 2023-02-26 DIAGNOSIS — F0153 Vascular dementia, unspecified severity, with mood disturbance: Secondary | ICD-10-CM | POA: Diagnosis not present

## 2023-02-27 DIAGNOSIS — F0153 Vascular dementia, unspecified severity, with mood disturbance: Secondary | ICD-10-CM | POA: Diagnosis not present

## 2023-02-27 DIAGNOSIS — I10 Essential (primary) hypertension: Secondary | ICD-10-CM | POA: Diagnosis not present

## 2023-02-27 DIAGNOSIS — I69318 Other symptoms and signs involving cognitive functions following cerebral infarction: Secondary | ICD-10-CM | POA: Diagnosis not present

## 2023-02-27 DIAGNOSIS — I672 Cerebral atherosclerosis: Secondary | ICD-10-CM | POA: Diagnosis not present

## 2023-02-27 DIAGNOSIS — F0154 Vascular dementia, unspecified severity, with anxiety: Secondary | ICD-10-CM | POA: Diagnosis not present

## 2023-02-27 DIAGNOSIS — R634 Abnormal weight loss: Secondary | ICD-10-CM | POA: Diagnosis not present

## 2023-03-03 DIAGNOSIS — I10 Essential (primary) hypertension: Secondary | ICD-10-CM | POA: Diagnosis not present

## 2023-03-03 DIAGNOSIS — F0154 Vascular dementia, unspecified severity, with anxiety: Secondary | ICD-10-CM | POA: Diagnosis not present

## 2023-03-03 DIAGNOSIS — I69318 Other symptoms and signs involving cognitive functions following cerebral infarction: Secondary | ICD-10-CM | POA: Diagnosis not present

## 2023-03-03 DIAGNOSIS — R634 Abnormal weight loss: Secondary | ICD-10-CM | POA: Diagnosis not present

## 2023-03-03 DIAGNOSIS — F0153 Vascular dementia, unspecified severity, with mood disturbance: Secondary | ICD-10-CM | POA: Diagnosis not present

## 2023-03-03 DIAGNOSIS — I672 Cerebral atherosclerosis: Secondary | ICD-10-CM | POA: Diagnosis not present

## 2023-03-09 DIAGNOSIS — R634 Abnormal weight loss: Secondary | ICD-10-CM | POA: Diagnosis not present

## 2023-03-09 DIAGNOSIS — I672 Cerebral atherosclerosis: Secondary | ICD-10-CM | POA: Diagnosis not present

## 2023-03-09 DIAGNOSIS — F0153 Vascular dementia, unspecified severity, with mood disturbance: Secondary | ICD-10-CM | POA: Diagnosis not present

## 2023-03-09 DIAGNOSIS — I10 Essential (primary) hypertension: Secondary | ICD-10-CM | POA: Diagnosis not present

## 2023-03-09 DIAGNOSIS — F0154 Vascular dementia, unspecified severity, with anxiety: Secondary | ICD-10-CM | POA: Diagnosis not present

## 2023-03-09 DIAGNOSIS — I69318 Other symptoms and signs involving cognitive functions following cerebral infarction: Secondary | ICD-10-CM | POA: Diagnosis not present

## 2023-03-15 DIAGNOSIS — I69318 Other symptoms and signs involving cognitive functions following cerebral infarction: Secondary | ICD-10-CM | POA: Diagnosis not present

## 2023-03-15 DIAGNOSIS — I672 Cerebral atherosclerosis: Secondary | ICD-10-CM | POA: Diagnosis not present

## 2023-03-15 DIAGNOSIS — F0154 Vascular dementia, unspecified severity, with anxiety: Secondary | ICD-10-CM | POA: Diagnosis not present

## 2023-03-15 DIAGNOSIS — F0153 Vascular dementia, unspecified severity, with mood disturbance: Secondary | ICD-10-CM | POA: Diagnosis not present

## 2023-03-15 DIAGNOSIS — I10 Essential (primary) hypertension: Secondary | ICD-10-CM | POA: Diagnosis not present

## 2023-03-15 DIAGNOSIS — R634 Abnormal weight loss: Secondary | ICD-10-CM | POA: Diagnosis not present

## 2023-03-17 ENCOUNTER — Non-Acute Institutional Stay (SKILLED_NURSING_FACILITY): Payer: Medicare Other | Admitting: Adult Health

## 2023-03-17 ENCOUNTER — Encounter: Payer: Self-pay | Admitting: Adult Health

## 2023-03-17 DIAGNOSIS — R634 Abnormal weight loss: Secondary | ICD-10-CM

## 2023-03-17 DIAGNOSIS — I69318 Other symptoms and signs involving cognitive functions following cerebral infarction: Secondary | ICD-10-CM | POA: Diagnosis not present

## 2023-03-17 DIAGNOSIS — R251 Tremor, unspecified: Secondary | ICD-10-CM

## 2023-03-17 DIAGNOSIS — K5903 Drug induced constipation: Secondary | ICD-10-CM

## 2023-03-17 DIAGNOSIS — I10 Essential (primary) hypertension: Secondary | ICD-10-CM | POA: Diagnosis not present

## 2023-03-17 DIAGNOSIS — F0153 Vascular dementia, unspecified severity, with mood disturbance: Secondary | ICD-10-CM | POA: Diagnosis not present

## 2023-03-17 DIAGNOSIS — F0154 Vascular dementia, unspecified severity, with anxiety: Secondary | ICD-10-CM | POA: Diagnosis not present

## 2023-03-17 DIAGNOSIS — F0150 Vascular dementia without behavioral disturbance: Secondary | ICD-10-CM

## 2023-03-17 DIAGNOSIS — F411 Generalized anxiety disorder: Secondary | ICD-10-CM

## 2023-03-17 DIAGNOSIS — I672 Cerebral atherosclerosis: Secondary | ICD-10-CM | POA: Diagnosis not present

## 2023-03-17 NOTE — Progress Notes (Signed)
Location:   [facility] Room Number: 146a Place of Service:  SNF [phone]) Provider:  Fletcher Anon, NP  Mahlon Gammon, MD  Patient Care Team: Mahlon Gammon, MD as PCP - General (Internal Medicine) Nahser, Deloris Ping, MD as PCP - Cardiology (Cardiology) Vida Rigger, MD as Consulting Physician (Gastroenterology) Levert Feinstein, MD as Consulting Physician (Neurology) Barnett Abu, MD as Consulting Physician (Neurosurgery) [place], Amy, MD as Consulting Physician (Dermatology) Jerene Bears, MD as Consulting Physician (Gynecology) [place], Heather, OD (Optometry) Mahlon Gammon, MD (Internal Medicine)  Extended Emergency Contact Information Primary Emergency Contact: Zetina,Robert J Address: [address]          [place], [place] [address] [place] of [place] Home Phone: [phone] Work Phone: [phone] Mobile Phone: [phone] Relation: Spouse Secondary Emergency Contact: [place] of [place] Home Phone: [phone] Mobile Phone: [phone] Relation: Son  Code Status:  DNR Goals of care: Advanced Directive information    03/17/2023    9:52 AM  Advanced Directives  Does Patient Have a Medical Advance Directive? Yes  Type of Estate agent of [place];Living will;Out of facility DNR (pink MOST or yellow form)  Does patient want to make changes to medical advance directive? No - Patient declined  Copy of Healthcare Power of Attorney in Chart? Yes - validated most recent copy scanned in chart (See row information)  Pre-existing out of facility DNR order (yellow form or pink MOST form) Yellow form placed in chart (order not valid for inpatient use)     Chief Complaint  Patient presents with   Medical Management of Chronic Issues    Routine follow up   Immunizations    COVID booster due    HPI:  Pt is a 82 y.o. female seen today for medical management of chronic diseases.   Enrolled in  hospice care due to progressive dementia and chronic pain related issues. She is also losing weight few months. Currently ordered chocolate boost bid. No dysphagia current on a regular diet. Needs assistance with all ADLs. Able to communicate needs but needs cuing and prompting. [place] for transfers.    Vascular dementia: Hx of TIA on plavix, on aricept and Namenda. No aggression, very pleasant. Has severe anxiety with a long term hx of this. Off wellbutrin due to lethargy.   Was having dizziness but this improved with reduction in neurontin  Has chronic back and hip pain. Norco ordered prn Has not been used this month. 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 Street of back injections by Dr. Murray Hodgkins 2016 with good relief, history of cervical DD with possiblity of surgery by Dr. Dutch Quint.   GERD (gastroesophageal reflux disease)    IBS with moderate refluc seen on upper GI study form January 2011 Dr. Ewing Schlein   HTN (hypertension)    no meds   Hyperlipidemia    Hypothyroidism    IBS (irritable bowel syndrome)    Imbalance    Impingement syndrome of right shoulder 01/05/2013   Mass  of left side of neck    Memory loss    Mood disorder (HCC)    MVP (mitral valve prolapse)    Hx of    Osteopenia    Peripheral edema    Right rotator cuff tear 01/05/2013   Sciatic pain    recurrent   Seasonal allergies    Stroke [facility])    Tension headache    TIA (transient ischemic attack)    Tremor    Past Surgical History:  Procedure Laterality Date   ANTERIOR CERVICAL DECOMP/DISCECTOMY FUSION  04/2015    Dr. Rise Mu,  [facility]   CARDIAC CATHETERIZATION  2004   which revealed smooth and normal coronary arteries   CARPAL TUNNEL RELEASE  2000   lt   CATARACT EXTRACTION W/ INTRAOCULAR LENS  IMPLANT, BILATERAL  1/17   Dr. Delaney Meigs   COSMETIC SURGERY  2010   eyes-facial   DENTAL SURGERY  1960   LASIK     LOOP RECORDER INSERTION N/A 09/22/2017   Procedure: LOOP RECORDER INSERTION;  Surgeon: Regan Lemming, MD;  Location: [facility] INVASIVE CV LAB;  Service: Cardiovascular;  Laterality: N/A;   LUMBAR LAMINECTOMY/DECOMPRESSION MICRODISCECTOMY N/A 06/08/2021   Procedure: Laminectomy and Foraminotomy - L4-L5, L5-S1;  Surgeon: Julio Sicks, MD;  Location: [facility] OR;  Service: Neurosurgery;  Laterality: N/A;   SHOULDER ARTHROSCOPY WITH ROTATOR CUFF REPAIR Right 01/05/2013   Procedure: SHOULDER ARTHROSCOPY WITH ARTHROSCOPIC  ROTATOR CUFF REPAIR, ACROMIOPLASTY, EXTENSIVE DEBRIDEMENT;  Surgeon: Eulas Post, MD;  Location: [facility];  Service: Orthopedics;  Laterality: Right;   TEE WITHOUT CARDIOVERSION N/A 09/22/2017   Procedure: TRANSESOPHAGEAL ECHOCARDIOGRAM (TEE);  Surgeon: Thurmon Fair, MD;  Location: [facility] ENDOSCOPY;  Service: Cardiovascular;  Laterality: N/A;   TONSILLECTOMY     TUBAL LIGATION      Allergies  Allergen Reactions   Fluticasone Other (See Comments)    DRY EYES   Aciphex [Rabeprazole] Other (See Comments)    Critical    Codeine Nausea Only   Codeine Other (See Comments)    unknown   Cymbalta [Duloxetine Hcl] Other (See Comments)    Critical    Lexapro [Escitalopram] Other (See Comments)    critical   Lipitor [Atorvastatin Calcium] Other (See Comments)    Intolerant    Morphine Sulfate     Per matrix   Pneumovax [Pneumococcal Polysaccharide Vaccine] Other (See Comments)    moderate   Prevacid [Lansoprazole] Other (See Comments)    Unknown, listed on MAR   Rosuvastatin Other (See Comments)    Intolerant    Sertraline Other (See Comments)    critical   Statins Other (See  Comments)    Critical    Sulfa Antibiotics Nausea And Vomiting   Tetracyclines & Related Other (See Comments)    Morphine Sulfate   Welchol [Colesevelam] Other (See Comments)    Unknown, listed on MAR   Pneumovax 23 [Pneumococcal Vac Polyvalent] Rash    Injection site reaction    Allergies as of 03/17/2023       Reactions   Fluticasone Other (See Comments)   DRY EYES   Aciphex [rabeprazole] Other (See Comments)   Critical    Codeine Nausea Only   Codeine Other (See Comments)   unknown   Cymbalta [duloxetine Hcl] Other (See Comments)   Critical   Lexapro [escitalopram] Other (See Comments)   critical   Lipitor [atorvastatin Calcium] Other (See Comments)   Intolerant    Morphine Sulfate    Per matrix  Pneumovax [pneumococcal Polysaccharide Vaccine] Other (See Comments)   moderate   Prevacid [lansoprazole] Other (See Comments)   Unknown, listed on MAR   Rosuvastatin Other (See Comments)   Intolerant    Sertraline Other (See Comments)   critical   Statins Other (See Comments)   Critical   Sulfa Antibiotics Nausea And Vomiting   Tetracyclines & Related Other (See Comments)   Morphine Sulfate   Welchol [colesevelam] Other (See Comments)   Unknown, listed on MAR   Pneumovax 23 [pneumococcal Vac Polyvalent] Rash   Injection site reaction        Medication List        Accurate as of March 17, 2023  9:57 AM. If you have any questions, ask your nurse or doctor.          STOP taking these medications    CALCIUM 500 + D PO Stopped by: Fletcher Anon, NP   clonazePAM 0.5 MG tablet Commonly known as: KLONOPIN Stopped by: Fletcher Anon, NP   PRESERVISION AREDS PO Stopped by: Fletcher Anon, NP   rosuvastatin 5 MG tablet Commonly known as: CRESTOR Stopped by: Fletcher Anon, NP       TAKE these medications    acetaminophen 325 MG tablet Commonly known as: TYLENOL Take 650 mg by mouth 3 (three) times daily. What changed: Another medication with the  same name was removed. Continue taking this medication, and follow the directions you see here. Changed by: Fletcher Anon, NP   calcium carbonate 500 MG chewable tablet Commonly known as: TUMS - dosed in mg elemental calcium Chew 2 tablets by mouth 2 (two) times daily as needed for indigestion or heartburn.   clopidogrel 75 MG tablet Commonly known as: PLAVIX Take 1 tablet (75 mg total) by mouth daily.   donepezil 10 MG tablet Commonly known as: ARICEPT Take 1 tablet (10 mg total) by mouth daily.   gabapentin 100 MG capsule Commonly known as: NEURONTIN Take 100 mg by mouth 3 (three) times daily.   HYDROcodone-acetaminophen 5-325 MG tablet Commonly known as: NORCO/VICODIN Take 1 tablet by mouth every 6 (six) hours as needed for moderate pain.   levothyroxine 88 MCG tablet Commonly known as: SYNTHROID Take 88 mcg by mouth daily before breakfast.   lidocaine 4 % Place 1 patch onto the skin in the morning and at bedtime.   melatonin 5 MG Tabs Take 5 mg by mouth at bedtime as needed (sleep).   memantine 5 MG tablet Commonly known as: NAMENDA Take 5 mg by mouth 2 (two) times daily.   mirabegron ER 25 MG Tb24 tablet Commonly known as: Myrbetriq Take 1 tablet (25 mg total) by mouth daily.   omeprazole 40 MG capsule Commonly known as: PRILOSEC Take 40 mg by mouth daily.   polyethylene glycol 17 g packet Commonly known as: MiraLax Take 17 g by mouth daily.   propranolol 20 MG tablet Commonly known as: INDERAL Take 20 mg by mouth 2 (two) times daily. 1 tablet (20 mg) oral, Twice A Day   Voltaren 1 % Gel Generic drug: diclofenac Sodium Apply 2 g topically 2 (two) times daily as needed (pain).        Review of Systems  Constitutional:  Negative for activity change, appetite change, chills, diaphoresis, fatigue, fever and unexpected weight change.  HENT:  Negative for congestion.   Respiratory:  Negative for cough, shortness of breath and wheezing.    Cardiovascular:  Negative for chest pain, palpitations and leg swelling.  Gastrointestinal:  Negative for abdominal distention, abdominal pain, constipation and diarrhea.  Genitourinary:  Negative for difficulty urinating and dysuria.  Musculoskeletal:  Positive for gait problem. Negative for arthralgias, back pain, joint swelling and myalgias.  Neurological:  Negative for dizziness, tremors, seizures, syncope, facial asymmetry, speech difficulty, weakness, light-headedness, numbness and headaches.  Psychiatric/Behavioral:  Positive for confusion. Negative for agitation and behavioral problems. The patient is nervous/anxious.     Immunization History  Administered Date(s) Administered   Fluad Quad(high Dose 65+) 07/02/2022   Influenza, High Dose Seasonal PF 07/23/2017, 07/03/2019   Influenza-Unspecified 05/27/2010, 08/03/2010, 07/22/2011, 07/01/2012, 06/22/2013, 07/18/2020, 07/11/2021, 07/02/2022   Janssen (J&J) SARS-COV-2 Vaccination 11/06/2019   Moderna SARS-COV2 Booster Vaccination 08/05/2021   Moderna Sars-Covid-2 Vaccination 08/07/2020   PFIZER(Purple Top)SARS-COV-2 Vaccination 10/09/2019, 08/07/2020   Pfizer Covid-19 Vaccine Bivalent Booster 58yrs & up 01/21/2023   Pneumococcal Conjugate-13 02/08/2015   Pneumococcal Polysaccharide-23 05/28/2006, 10/13/2011   Pneumococcal-Unspecified 10/17/2013   Td 03/29/2002   Tdap 09/27/2001, 04/25/2002, 08/18/2012, 10/17/2013   Zoster Recombinat (Shingrix) 05/23/2021, 07/23/2021   Zoster, Live 08/30/2006   Zoster, Unspecified 08/30/2006   Pertinent  Health Maintenance Due  Topic Date Due   INFLUENZA VACCINE  04/28/2023   DEXA SCAN  Completed      08/07/2022   11:17 AM 08/07/2022   11:03 PM 08/08/2022    8:00 PM 08/09/2022    8:00 AM 08/17/2022   10:59 AM  Fall Risk  Falls in the past year?     1  Was there an injury with Fall?     1  Fall Risk Category Calculator     3  Fall Risk Category (Retired)     High  (RETIRED) Patient  Fall Risk Level High fall risk High fall risk High fall risk High fall risk High fall risk  Patient at Risk for Falls Due to     History of fall(s);Impaired balance/gait;Impaired mobility  Fall risk Follow up     Falls evaluation completed   Functional Status Survey:    Vitals:   03/17/23 0950  BP: 129/75  Pulse: 74  Resp: 16  Temp: 98.5 F (36.9 C)  SpO2: 93%  Weight: 146 lb (66.2 kg)  Height: 5\' 1"  (1.549 m)   Body mass index is 27.59 kg/m. Wt Readings from Last 3 Encounters:  03/17/23 146 lb (66.2 kg)  02/18/23 146 lb (66.2 kg)  01/28/23 149 lb 9.6 oz (67.9 kg)    Physical Exam Vitals and nursing note reviewed.  Constitutional:      General: She is not in acute distress.    Appearance: She is not diaphoretic.  HENT:     Head: Normocephalic and atraumatic.  Neck:     Vascular: No JVD.  Cardiovascular:     Rate and Rhythm: Normal rate and regular rhythm.     Heart sounds: No murmur heard. Pulmonary:     Effort: Pulmonary effort is normal. No respiratory distress.     Breath sounds: Normal breath sounds. No wheezing.  Abdominal:     General: Bowel sounds are normal. There is no distension.     Palpations: Abdomen is soft.  Musculoskeletal:     Comments: Varicose veins. Trace edema to both ankles.   Skin:    General: Skin is warm and dry.  Neurological:     General: No focal deficit present.     Mental Status: She is alert. Mental status is at baseline.  Psychiatric:        Mood and Affect: Mood  normal.     Labs reviewed: Recent Labs    08/07/22 1122 08/07/22 1615 08/07/22 2343 08/16/22 1300 12/22/22 1850 01/24/23 0000  NA 141  --    < > 142 138 136*  K 4.2  --    < > 4.1 4.1 4.4  CL 104  --    < > 104 103 100  CO2 29  --    < > 23* 25 25*  GLUCOSE 99  --   --   --  108*  --   BUN 14  --    < > 10 11 12   CREATININE 0.95  --    < > 0.7 0.76 0.7  CALCIUM 9.2  --    < > 9.3 8.6* 8.6*  MG  --  2.2  --   --   --   --   PHOS  --  4.0  --   --   --    --    < > = values in this interval not displayed.   Recent Labs    08/07/22 1615 08/16/22 0000 08/16/22 1300 12/22/22 1850 01/24/23 0000  AST 20   < > 16 16 27   ALT 14   < > 14 21 36*  ALKPHOS 69   < > 100 109 167*  BILITOT 0.5  --   --  0.8  --   PROT 6.8  --   --  6.9  --   ALBUMIN 3.6   < > 4.0 3.1* 3.7   < > = values in this interval not displayed.   Recent Labs    08/07/22 1122 08/07/22 2343 08/18/22 0000 12/22/22 1850 01/24/23 0000  WBC 9.9  --  7.2 9.7 9.3  NEUTROABS 7.5  --   --   --  7.10  HGB 14.1   < > 13.5 13.4 13.1  HCT 43.4   < > 41 41.3 38  MCV 98.9  --   --  96.9  --   PLT 301  --  298 350 341   < > = values in this interval not displayed.   Lab Results  Component Value Date   TSH 1.147 08/07/2022   Lab Results  Component Value Date   HGBA1C 5.6 09/21/2017   Lab Results  Component Value Date   CHOL 174 03/23/2022   HDL 66 03/23/2022   LDLCALC 81 03/23/2022   TRIG 134 03/23/2022   CHOLHDL 2.1 09/21/2017    Significant Diagnostic Results in last 30 days:  No results found.  Assessment/Plan  1. Weight loss With progressive dementia under hospice care Continue boost D/c aricept due to weight loss and lack of benefit.  2. Vascular dementia without behavioral disturbance (HCC) Progressive decline in cognition and physical function c/w the disease. Continue supportive care in the skilled environment. Continue Namenda.   3. Generalized anxiety disorder Not currently on clonazepam, seems calm Since she is under hospice care with the goal of comfort if this is needed for comfort we can re instate the order.   4. Drug-induced constipation Continue miralax   5. Tremor Controlled with propranolol    Family/ staff Communication: nurse  Labs/tests ordered:  NA

## 2023-03-18 ENCOUNTER — Encounter: Payer: Self-pay | Admitting: Adult Health

## 2023-03-21 MED ORDER — CLONAZEPAM 0.5 MG PO TABS: 0.2500 mg | ORAL_TABLET | Freq: Every evening | ORAL | 1 refills | Status: DC | PRN

## 2023-03-21 NOTE — Addendum Note (Signed)
Addended by: Esmond Camper on: 03/21/2023 09:17 AM   Modules accepted: Orders

## 2023-03-22 DIAGNOSIS — I10 Essential (primary) hypertension: Secondary | ICD-10-CM | POA: Diagnosis not present

## 2023-03-22 DIAGNOSIS — I69318 Other symptoms and signs involving cognitive functions following cerebral infarction: Secondary | ICD-10-CM | POA: Diagnosis not present

## 2023-03-22 DIAGNOSIS — I672 Cerebral atherosclerosis: Secondary | ICD-10-CM | POA: Diagnosis not present

## 2023-03-22 DIAGNOSIS — F0154 Vascular dementia, unspecified severity, with anxiety: Secondary | ICD-10-CM | POA: Diagnosis not present

## 2023-03-22 DIAGNOSIS — R634 Abnormal weight loss: Secondary | ICD-10-CM | POA: Diagnosis not present

## 2023-03-22 DIAGNOSIS — F0153 Vascular dementia, unspecified severity, with mood disturbance: Secondary | ICD-10-CM | POA: Diagnosis not present

## 2023-03-23 DIAGNOSIS — F0153 Vascular dementia, unspecified severity, with mood disturbance: Secondary | ICD-10-CM | POA: Diagnosis not present

## 2023-03-23 DIAGNOSIS — I10 Essential (primary) hypertension: Secondary | ICD-10-CM | POA: Diagnosis not present

## 2023-03-23 DIAGNOSIS — F0154 Vascular dementia, unspecified severity, with anxiety: Secondary | ICD-10-CM | POA: Diagnosis not present

## 2023-03-23 DIAGNOSIS — I672 Cerebral atherosclerosis: Secondary | ICD-10-CM | POA: Diagnosis not present

## 2023-03-23 DIAGNOSIS — R634 Abnormal weight loss: Secondary | ICD-10-CM | POA: Diagnosis not present

## 2023-03-23 DIAGNOSIS — I69318 Other symptoms and signs involving cognitive functions following cerebral infarction: Secondary | ICD-10-CM | POA: Diagnosis not present

## 2023-03-28 DIAGNOSIS — M48061 Spinal stenosis, lumbar region without neurogenic claudication: Secondary | ICD-10-CM | POA: Diagnosis not present

## 2023-03-28 DIAGNOSIS — K589 Irritable bowel syndrome without diarrhea: Secondary | ICD-10-CM | POA: Diagnosis not present

## 2023-03-28 DIAGNOSIS — F0153 Vascular dementia, unspecified severity, with mood disturbance: Secondary | ICD-10-CM | POA: Diagnosis not present

## 2023-03-28 DIAGNOSIS — R634 Abnormal weight loss: Secondary | ICD-10-CM | POA: Diagnosis not present

## 2023-03-28 DIAGNOSIS — E785 Hyperlipidemia, unspecified: Secondary | ICD-10-CM | POA: Diagnosis not present

## 2023-03-28 DIAGNOSIS — F0154 Vascular dementia, unspecified severity, with anxiety: Secondary | ICD-10-CM | POA: Diagnosis not present

## 2023-03-28 DIAGNOSIS — S2243XD Multiple fractures of ribs, bilateral, subsequent encounter for fracture with routine healing: Secondary | ICD-10-CM | POA: Diagnosis not present

## 2023-03-28 DIAGNOSIS — I10 Essential (primary) hypertension: Secondary | ICD-10-CM | POA: Diagnosis not present

## 2023-03-28 DIAGNOSIS — I69318 Other symptoms and signs involving cognitive functions following cerebral infarction: Secondary | ICD-10-CM | POA: Diagnosis not present

## 2023-03-28 DIAGNOSIS — I672 Cerebral atherosclerosis: Secondary | ICD-10-CM | POA: Diagnosis not present

## 2023-03-28 DIAGNOSIS — E039 Hypothyroidism, unspecified: Secondary | ICD-10-CM | POA: Diagnosis not present

## 2023-03-28 DIAGNOSIS — K219 Gastro-esophageal reflux disease without esophagitis: Secondary | ICD-10-CM | POA: Diagnosis not present

## 2023-03-28 DIAGNOSIS — Z9181 History of falling: Secondary | ICD-10-CM | POA: Diagnosis not present

## 2023-03-29 DIAGNOSIS — F0153 Vascular dementia, unspecified severity, with mood disturbance: Secondary | ICD-10-CM | POA: Diagnosis not present

## 2023-03-29 DIAGNOSIS — I10 Essential (primary) hypertension: Secondary | ICD-10-CM | POA: Diagnosis not present

## 2023-03-29 DIAGNOSIS — F0154 Vascular dementia, unspecified severity, with anxiety: Secondary | ICD-10-CM | POA: Diagnosis not present

## 2023-03-29 DIAGNOSIS — I672 Cerebral atherosclerosis: Secondary | ICD-10-CM | POA: Diagnosis not present

## 2023-03-29 DIAGNOSIS — I69318 Other symptoms and signs involving cognitive functions following cerebral infarction: Secondary | ICD-10-CM | POA: Diagnosis not present

## 2023-03-29 DIAGNOSIS — R634 Abnormal weight loss: Secondary | ICD-10-CM | POA: Diagnosis not present

## 2023-04-04 DIAGNOSIS — I69318 Other symptoms and signs involving cognitive functions following cerebral infarction: Secondary | ICD-10-CM | POA: Diagnosis not present

## 2023-04-04 DIAGNOSIS — F0153 Vascular dementia, unspecified severity, with mood disturbance: Secondary | ICD-10-CM | POA: Diagnosis not present

## 2023-04-04 DIAGNOSIS — I10 Essential (primary) hypertension: Secondary | ICD-10-CM | POA: Diagnosis not present

## 2023-04-04 DIAGNOSIS — R634 Abnormal weight loss: Secondary | ICD-10-CM | POA: Diagnosis not present

## 2023-04-04 DIAGNOSIS — F0154 Vascular dementia, unspecified severity, with anxiety: Secondary | ICD-10-CM | POA: Diagnosis not present

## 2023-04-04 DIAGNOSIS — I672 Cerebral atherosclerosis: Secondary | ICD-10-CM | POA: Diagnosis not present

## 2023-04-06 DIAGNOSIS — I10 Essential (primary) hypertension: Secondary | ICD-10-CM | POA: Diagnosis not present

## 2023-04-06 DIAGNOSIS — I672 Cerebral atherosclerosis: Secondary | ICD-10-CM | POA: Diagnosis not present

## 2023-04-06 DIAGNOSIS — F0154 Vascular dementia, unspecified severity, with anxiety: Secondary | ICD-10-CM | POA: Diagnosis not present

## 2023-04-06 DIAGNOSIS — I69318 Other symptoms and signs involving cognitive functions following cerebral infarction: Secondary | ICD-10-CM | POA: Diagnosis not present

## 2023-04-06 DIAGNOSIS — R634 Abnormal weight loss: Secondary | ICD-10-CM | POA: Diagnosis not present

## 2023-04-06 DIAGNOSIS — F0153 Vascular dementia, unspecified severity, with mood disturbance: Secondary | ICD-10-CM | POA: Diagnosis not present

## 2023-04-13 DIAGNOSIS — I69318 Other symptoms and signs involving cognitive functions following cerebral infarction: Secondary | ICD-10-CM | POA: Diagnosis not present

## 2023-04-13 DIAGNOSIS — F0153 Vascular dementia, unspecified severity, with mood disturbance: Secondary | ICD-10-CM | POA: Diagnosis not present

## 2023-04-13 DIAGNOSIS — I672 Cerebral atherosclerosis: Secondary | ICD-10-CM | POA: Diagnosis not present

## 2023-04-13 DIAGNOSIS — R634 Abnormal weight loss: Secondary | ICD-10-CM | POA: Diagnosis not present

## 2023-04-13 DIAGNOSIS — I10 Essential (primary) hypertension: Secondary | ICD-10-CM | POA: Diagnosis not present

## 2023-04-13 DIAGNOSIS — F0154 Vascular dementia, unspecified severity, with anxiety: Secondary | ICD-10-CM | POA: Diagnosis not present

## 2023-04-18 ENCOUNTER — Non-Acute Institutional Stay (SKILLED_NURSING_FACILITY): Payer: Medicare Other | Admitting: Internal Medicine

## 2023-04-18 DIAGNOSIS — R634 Abnormal weight loss: Secondary | ICD-10-CM | POA: Diagnosis not present

## 2023-04-18 DIAGNOSIS — G8929 Other chronic pain: Secondary | ICD-10-CM

## 2023-04-18 DIAGNOSIS — E039 Hypothyroidism, unspecified: Secondary | ICD-10-CM | POA: Diagnosis not present

## 2023-04-18 DIAGNOSIS — K5903 Drug induced constipation: Secondary | ICD-10-CM

## 2023-04-18 DIAGNOSIS — M5442 Lumbago with sciatica, left side: Secondary | ICD-10-CM

## 2023-04-18 DIAGNOSIS — R251 Tremor, unspecified: Secondary | ICD-10-CM

## 2023-04-18 DIAGNOSIS — F0150 Vascular dementia without behavioral disturbance: Secondary | ICD-10-CM

## 2023-04-18 DIAGNOSIS — F411 Generalized anxiety disorder: Secondary | ICD-10-CM | POA: Diagnosis not present

## 2023-04-18 DIAGNOSIS — M5441 Lumbago with sciatica, right side: Secondary | ICD-10-CM

## 2023-04-20 DIAGNOSIS — I69318 Other symptoms and signs involving cognitive functions following cerebral infarction: Secondary | ICD-10-CM | POA: Diagnosis not present

## 2023-04-20 DIAGNOSIS — I672 Cerebral atherosclerosis: Secondary | ICD-10-CM | POA: Diagnosis not present

## 2023-04-20 DIAGNOSIS — F0154 Vascular dementia, unspecified severity, with anxiety: Secondary | ICD-10-CM | POA: Diagnosis not present

## 2023-04-20 DIAGNOSIS — R634 Abnormal weight loss: Secondary | ICD-10-CM | POA: Diagnosis not present

## 2023-04-20 DIAGNOSIS — F0153 Vascular dementia, unspecified severity, with mood disturbance: Secondary | ICD-10-CM | POA: Diagnosis not present

## 2023-04-20 DIAGNOSIS — I10 Essential (primary) hypertension: Secondary | ICD-10-CM | POA: Diagnosis not present

## 2023-04-24 ENCOUNTER — Encounter: Payer: Self-pay | Admitting: Internal Medicine

## 2023-04-24 NOTE — Progress Notes (Signed)
Location:  Medical illustrator of Service:  SNF (31)  Provider: Mahlon Gammon   Code Status: DNR [name] Goals of Care:     03/17/2023    9:52 AM  Advanced Directives  Does Patient Have a Medical Advance Directive? Yes  Type of Estate agent of [place];Living will;Out of facility DNR (pink MOST or yellow form)  Does patient want to make changes to medical advance directive? No - Patient declined  Copy of Healthcare Power of Attorney in Chart? Yes - validated most recent copy scanned in chart (See row information)  Pre-existing out of facility DNR order (yellow form or pink MOST form) Yellow form placed in chart (order not valid for inpatient use)     Chief Complaint  Patient presents with   Chronic Care Management    HPI: Patient is a 82 y.o. female seen today for medical management of chronic diseases.   Lives in [place] in SNF   Patient has h/o  Vascular dementia Worsening Cognition Enrolled in hospice Chronic Pain in  neck and back pain Depression and Anxiety S/p CVA in 2018  Essential Tremor, Hypothyroidism, Hyperlipidemia,  S/P L4-5, L5-S1 decompressive laminectomy with foraminotomies due to Severe Back pain Done on 06/08/21    She  is stable.No new Nursing issues. 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 Street of back injections by Dr. Murray Hodgkins 2016 with good relief, history of  cervical DD with possiblity of surgery by Dr. Dutch Quint.   GERD (gastroesophageal reflux disease)    IBS with moderate refluc seen on upper GI study form January 2011 Dr. Ewing Schlein   HTN (hypertension)    no meds   Hyperlipidemia    Hypothyroidism    IBS (irritable bowel syndrome)    Imbalance    Impingement syndrome of right shoulder 01/05/2013   Mass of left side of neck    Memory loss    Mood disorder (HCC)    MVP (mitral valve prolapse)    Hx of    Osteopenia    Peripheral edema    Right rotator cuff tear 01/05/2013   Sciatic pain    recurrent   Seasonal allergies    Stroke [facility])    Tension headache    TIA (transient ischemic attack)    Tremor     Past Surgical History:  Procedure Laterality Date   ANTERIOR CERVICAL DECOMP/DISCECTOMY FUSION  04/2015    Dr. Rise Mu, [facility]   CARDIAC CATHETERIZATION  2004   which revealed smooth and normal coronary arteries   CARPAL TUNNEL RELEASE  2000   lt   CATARACT EXTRACTION W/ INTRAOCULAR LENS  IMPLANT, BILATERAL  1/17   Dr. Delaney Meigs   COSMETIC SURGERY  2010   eyes-facial   DENTAL SURGERY  1960   LASIK     LOOP RECORDER INSERTION N/A 09/22/2017  Procedure: LOOP RECORDER INSERTION;  Surgeon: Regan Lemming, MD;  Location: [facility] INVASIVE [facility];  Service: Cardiovascular;  Laterality: N/A;   LUMBAR LAMINECTOMY/DECOMPRESSION MICRODISCECTOMY N/A 06/08/2021   Procedure: Laminectomy and Foraminotomy - L4-L5, L5-S1;  Surgeon: Julio Sicks, MD;  Location: [facility] OR;  Service: Neurosurgery;  Laterality: N/A;   SHOULDER ARTHROSCOPY WITH ROTATOR CUFF REPAIR Right 01/05/2013   Procedure: SHOULDER ARTHROSCOPY WITH ARTHROSCOPIC  ROTATOR CUFF REPAIR, ACROMIOPLASTY, EXTENSIVE DEBRIDEMENT;  Surgeon: Eulas Post, MD;  Location: [facility];  Service: Orthopedics;  Laterality: Right;   TEE WITHOUT CARDIOVERSION N/A 09/22/2017   Procedure: TRANSESOPHAGEAL ECHOCARDIOGRAM (TEE);  Surgeon: Thurmon Fair, MD;  Location: [facility] ENDOSCOPY;   Service: Cardiovascular;  Laterality: N/A;   TONSILLECTOMY     TUBAL LIGATION      Allergies  Allergen Reactions   Fluticasone Other (See Comments)    DRY EYES   Aciphex [Rabeprazole] Other (See Comments)    Critical    Codeine Nausea Only   Codeine Other (See Comments)    unknown   Cymbalta [Duloxetine Hcl] Other (See Comments)    Critical    Lexapro [Escitalopram] Other (See Comments)    critical   Lipitor [Atorvastatin Calcium] Other (See Comments)    Intolerant    Morphine Sulfate     Per matrix   Pneumovax [Pneumococcal Polysaccharide Vaccine] Other (See Comments)    moderate   Prevacid [Lansoprazole] Other (See Comments)    Unknown, listed on MAR   Rosuvastatin Other (See Comments)    Intolerant    Sertraline Other (See Comments)    critical   Statins Other (See Comments)    Critical    Sulfa Antibiotics Nausea And Vomiting   Tetracyclines & Related Other (See Comments)    Morphine Sulfate   Welchol [Colesevelam] Other (See Comments)    Unknown, listed on MAR   Pneumovax 23 [Pneumococcal Vac Polyvalent] Rash    Injection site reaction    Outpatient Encounter Medications as of 04/18/2023  Medication Sig   acetaminophen (TYLENOL) 325 MG tablet Take 650 mg by mouth every 6 (six) hours as needed.   calcium carbonate (TUMS - DOSED IN MG ELEMENTAL CALCIUM) 500 MG chewable tablet Chew 2 tablets by mouth 2 (two) times daily as needed for indigestion or heartburn.   clopidogrel (PLAVIX) 75 MG tablet Take 1 tablet (75 mg total) by mouth daily.   diclofenac Sodium (VOLTAREN) 1 % GEL Apply 2 g topically 2 (two) times daily as needed (pain).   gabapentin (NEURONTIN) 100 MG capsule Take 100 mg by mouth 3 (three) times daily.   levothyroxine (SYNTHROID) 88 MCG tablet Take 88 mcg by mouth daily before breakfast.   lidocaine 4 % Place 1 patch onto the skin in the morning and at bedtime.   memantine (NAMENDA) 5 MG tablet Take 5 mg by mouth 2 (two) times daily.   mirabegron ER  (MYRBETRIQ) 25 MG TB24 tablet Take 1 tablet (25 mg total) by mouth daily.   omeprazole (PRILOSEC) 40 MG capsule Take 40 mg by mouth daily.   polyethylene glycol (MIRALAX) 17 g packet Take 17 g by mouth daily.   propranolol (INDERAL) 20 MG tablet Take 20 mg by mouth 2 (two) times daily. 1 tablet (20 mg) oral, Twice A Day   [DISCONTINUED] clonazePAM (KLONOPIN) 0.5 MG tablet Take 0.5 tablets (0.25 mg total) by mouth at bedtime as needed for anxiety.   [DISCONTINUED] HYDROcodone-acetaminophen (NORCO/VICODIN) 5-325 MG tablet Take 1 tablet by mouth every 6 (  six) hours as needed for moderate pain.   [DISCONTINUED] melatonin 5 MG TABS Take 5 mg by mouth at bedtime as needed (sleep).   No facility-administered encounter medications on file as of 04/18/2023.    Review of Systems:  Review of Systems  Unable to perform ROS: Dementia    Health Maintenance  Topic Date Due   COVID-19 Vaccine (7 - 2023-24 season) 03/18/2023   INFLUENZA VACCINE  04/28/2023   DTaP/Tdap/Td (6 - Td or Tdap) 10/18/2023   Pneumonia Vaccine 57+ Years old  Completed   DEXA SCAN  Completed   Zoster Vaccines- Shingrix  Completed   HPV VACCINES  Aged Out    Physical Exam: Vitals:   04/24/23 1424  BP: 138/83  Pulse: 71  Resp: 17  Temp: 97.7 F (36.5 C)   There is no height or weight on file to calculate BMI. Physical Exam Vitals reviewed.  Constitutional:      Appearance: Normal appearance.  HENT:     Head: Normocephalic.     Nose: Nose normal.     Mouth/Throat:     Mouth: Mucous membranes are moist.     Pharynx: Oropharynx is clear.  Eyes:     Pupils: Pupils are equal, round, and reactive to light.  Cardiovascular:     Rate and Rhythm: Normal rate and regular rhythm.     Pulses: Normal pulses.     Heart sounds: Normal heart sounds. No murmur heard. Pulmonary:     Effort: Pulmonary effort is normal.     Breath sounds: Normal breath sounds.  Abdominal:     General: Abdomen is flat. Bowel sounds are normal.      Palpations: Abdomen is soft.  Musculoskeletal:        General: No swelling.     Cervical back: Neck supple.  Skin:    General: Skin is warm.  Neurological:     General: No focal deficit present.     Mental Status: She is alert.  Psychiatric:        Mood and Affect: Mood normal.        Thought Content: Thought content normal.     Labs reviewed: Basic Metabolic Panel: Recent Labs    08/07/22 1122 08/07/22 1615 08/07/22 2343 12/22/22 1850 01/24/23 0000 01/25/23 0000  NA 141  --    < > 138 136* 137  K 4.2  --    < > 4.1 4.4 4.3  CL 104  --    < > 103 100 100  CO2 29  --    < > 25 25* 24*  GLUCOSE 99  --   --  108*  --   --   BUN 14  --    < > 11 12 12   CREATININE 0.95  --    < > 0.76 0.7 0.6  CALCIUM 9.2  --    < > 8.6* 8.6* 8.6*  MG  --  2.2  --   --   --   --   PHOS  --  4.0  --   --   --   --   TSH  --  1.147  --   --   --  0.57   < > = values in this interval not displayed.   Liver Function Tests: Recent Labs    08/07/22 1615 08/16/22 0000 08/16/22 1300 12/22/22 1850 01/24/23 0000  AST 20   < > 16 16 27   ALT 14   < >  14 21 36*  ALKPHOS 69   < > 100 109 167*  BILITOT 0.5  --   --  0.8  --   PROT 6.8  --   --  6.9  --   ALBUMIN 3.6   < > 4.0 3.1* 3.7   < > = values in this interval not displayed.   No results for input(s): "LIPASE", "AMYLASE" in the last 8760 hours. No results for input(s): "AMMONIA" in the last 8760 hours. CBC: Recent Labs    08/07/22 1122 08/07/22 2343 08/18/22 0000 12/22/22 1850 01/24/23 0000  WBC 9.9  --  7.2 9.7 9.3  NEUTROABS 7.5  --   --   --  7.10  HGB 14.1   < > 13.5 13.4 13.1  HCT 43.4   < > 41 41.3 38  MCV 98.9  --   --  96.9  --   PLT 301  --  298 350 341   < > = values in this interval not displayed.   Lipid Panel: No results for input(s): "CHOL", "HDL", "LDLCALC", "TRIG", "CHOLHDL", "LDLDIRECT" in the last 8760 hours. Lab Results  Component Value Date   HGBA1C 5.6 09/21/2017    Procedures since last  visit: No results found.  Assessment/Plan 1. Weight loss Enrolled in hospice Overall stable  2. Vascular dementia without behavioral disturbance (HCC) On Namenda to help with behaviors Supportive care 3. Generalized anxiety disorder Off Klonopin Not  Antidepressant as did not tolerate  4. Drug-induced constipation Miralax  5. Tremor Propanolol  6. Chronic bilateral low back pain with bilateral sciatica On Neurontin Cannot tolerate higher dose  7. Hypothyroidism, unspecified type TSH normal in 4/24    Labs/tests ordered:    Mahlon Gammon, MD

## 2023-04-27 DIAGNOSIS — F0153 Vascular dementia, unspecified severity, with mood disturbance: Secondary | ICD-10-CM | POA: Diagnosis not present

## 2023-04-27 DIAGNOSIS — I672 Cerebral atherosclerosis: Secondary | ICD-10-CM | POA: Diagnosis not present

## 2023-04-27 DIAGNOSIS — I10 Essential (primary) hypertension: Secondary | ICD-10-CM | POA: Diagnosis not present

## 2023-04-27 DIAGNOSIS — F0154 Vascular dementia, unspecified severity, with anxiety: Secondary | ICD-10-CM | POA: Diagnosis not present

## 2023-04-27 DIAGNOSIS — R634 Abnormal weight loss: Secondary | ICD-10-CM | POA: Diagnosis not present

## 2023-04-27 DIAGNOSIS — I69318 Other symptoms and signs involving cognitive functions following cerebral infarction: Secondary | ICD-10-CM | POA: Diagnosis not present

## 2023-04-28 DIAGNOSIS — I672 Cerebral atherosclerosis: Secondary | ICD-10-CM | POA: Diagnosis not present

## 2023-04-28 DIAGNOSIS — Z9181 History of falling: Secondary | ICD-10-CM | POA: Diagnosis not present

## 2023-04-28 DIAGNOSIS — S2243XD Multiple fractures of ribs, bilateral, subsequent encounter for fracture with routine healing: Secondary | ICD-10-CM | POA: Diagnosis not present

## 2023-04-28 DIAGNOSIS — F0153 Vascular dementia, unspecified severity, with mood disturbance: Secondary | ICD-10-CM | POA: Diagnosis not present

## 2023-04-28 DIAGNOSIS — M48061 Spinal stenosis, lumbar region without neurogenic claudication: Secondary | ICD-10-CM | POA: Diagnosis not present

## 2023-04-28 DIAGNOSIS — I69318 Other symptoms and signs involving cognitive functions following cerebral infarction: Secondary | ICD-10-CM | POA: Diagnosis not present

## 2023-04-28 DIAGNOSIS — F0154 Vascular dementia, unspecified severity, with anxiety: Secondary | ICD-10-CM | POA: Diagnosis not present

## 2023-04-28 DIAGNOSIS — K589 Irritable bowel syndrome without diarrhea: Secondary | ICD-10-CM | POA: Diagnosis not present

## 2023-04-28 DIAGNOSIS — R634 Abnormal weight loss: Secondary | ICD-10-CM | POA: Diagnosis not present

## 2023-04-28 DIAGNOSIS — K219 Gastro-esophageal reflux disease without esophagitis: Secondary | ICD-10-CM | POA: Diagnosis not present

## 2023-04-28 DIAGNOSIS — E785 Hyperlipidemia, unspecified: Secondary | ICD-10-CM | POA: Diagnosis not present

## 2023-04-28 DIAGNOSIS — E039 Hypothyroidism, unspecified: Secondary | ICD-10-CM | POA: Diagnosis not present

## 2023-04-28 DIAGNOSIS — I10 Essential (primary) hypertension: Secondary | ICD-10-CM | POA: Diagnosis not present

## 2023-05-04 DIAGNOSIS — I69318 Other symptoms and signs involving cognitive functions following cerebral infarction: Secondary | ICD-10-CM | POA: Diagnosis not present

## 2023-05-04 DIAGNOSIS — I672 Cerebral atherosclerosis: Secondary | ICD-10-CM | POA: Diagnosis not present

## 2023-05-04 DIAGNOSIS — F0154 Vascular dementia, unspecified severity, with anxiety: Secondary | ICD-10-CM | POA: Diagnosis not present

## 2023-05-04 DIAGNOSIS — R634 Abnormal weight loss: Secondary | ICD-10-CM | POA: Diagnosis not present

## 2023-05-04 DIAGNOSIS — I10 Essential (primary) hypertension: Secondary | ICD-10-CM | POA: Diagnosis not present

## 2023-05-04 DIAGNOSIS — F0153 Vascular dementia, unspecified severity, with mood disturbance: Secondary | ICD-10-CM | POA: Diagnosis not present

## 2023-05-10 DIAGNOSIS — I672 Cerebral atherosclerosis: Secondary | ICD-10-CM | POA: Diagnosis not present

## 2023-05-10 DIAGNOSIS — R634 Abnormal weight loss: Secondary | ICD-10-CM | POA: Diagnosis not present

## 2023-05-10 DIAGNOSIS — F0153 Vascular dementia, unspecified severity, with mood disturbance: Secondary | ICD-10-CM | POA: Diagnosis not present

## 2023-05-10 DIAGNOSIS — I10 Essential (primary) hypertension: Secondary | ICD-10-CM | POA: Diagnosis not present

## 2023-05-10 DIAGNOSIS — I69318 Other symptoms and signs involving cognitive functions following cerebral infarction: Secondary | ICD-10-CM | POA: Diagnosis not present

## 2023-05-10 DIAGNOSIS — F0154 Vascular dementia, unspecified severity, with anxiety: Secondary | ICD-10-CM | POA: Diagnosis not present

## 2023-05-16 DIAGNOSIS — I672 Cerebral atherosclerosis: Secondary | ICD-10-CM | POA: Diagnosis not present

## 2023-05-16 DIAGNOSIS — R634 Abnormal weight loss: Secondary | ICD-10-CM | POA: Diagnosis not present

## 2023-05-16 DIAGNOSIS — I10 Essential (primary) hypertension: Secondary | ICD-10-CM | POA: Diagnosis not present

## 2023-05-16 DIAGNOSIS — F0154 Vascular dementia, unspecified severity, with anxiety: Secondary | ICD-10-CM | POA: Diagnosis not present

## 2023-05-16 DIAGNOSIS — F0153 Vascular dementia, unspecified severity, with mood disturbance: Secondary | ICD-10-CM | POA: Diagnosis not present

## 2023-05-16 DIAGNOSIS — I69318 Other symptoms and signs involving cognitive functions following cerebral infarction: Secondary | ICD-10-CM | POA: Diagnosis not present

## 2023-05-19 ENCOUNTER — Encounter: Payer: Self-pay | Admitting: Adult Health

## 2023-05-19 ENCOUNTER — Non-Acute Institutional Stay (SKILLED_NURSING_FACILITY): Payer: Medicare Other | Admitting: Adult Health

## 2023-05-19 DIAGNOSIS — F0150 Vascular dementia without behavioral disturbance: Secondary | ICD-10-CM

## 2023-05-19 DIAGNOSIS — I10 Essential (primary) hypertension: Secondary | ICD-10-CM | POA: Diagnosis not present

## 2023-05-19 DIAGNOSIS — R251 Tremor, unspecified: Secondary | ICD-10-CM | POA: Diagnosis not present

## 2023-05-19 DIAGNOSIS — M5441 Lumbago with sciatica, right side: Secondary | ICD-10-CM

## 2023-05-19 DIAGNOSIS — N3281 Overactive bladder: Secondary | ICD-10-CM | POA: Diagnosis not present

## 2023-05-19 DIAGNOSIS — M5442 Lumbago with sciatica, left side: Secondary | ICD-10-CM | POA: Diagnosis not present

## 2023-05-19 DIAGNOSIS — Z8673 Personal history of transient ischemic attack (TIA), and cerebral infarction without residual deficits: Secondary | ICD-10-CM | POA: Diagnosis not present

## 2023-05-19 DIAGNOSIS — G8929 Other chronic pain: Secondary | ICD-10-CM

## 2023-05-19 DIAGNOSIS — R634 Abnormal weight loss: Secondary | ICD-10-CM | POA: Diagnosis not present

## 2023-05-19 DIAGNOSIS — F0154 Vascular dementia, unspecified severity, with anxiety: Secondary | ICD-10-CM | POA: Diagnosis not present

## 2023-05-19 DIAGNOSIS — K5903 Drug induced constipation: Secondary | ICD-10-CM | POA: Diagnosis not present

## 2023-05-19 DIAGNOSIS — I672 Cerebral atherosclerosis: Secondary | ICD-10-CM | POA: Diagnosis not present

## 2023-05-19 DIAGNOSIS — I69318 Other symptoms and signs involving cognitive functions following cerebral infarction: Secondary | ICD-10-CM | POA: Diagnosis not present

## 2023-05-19 DIAGNOSIS — F0153 Vascular dementia, unspecified severity, with mood disturbance: Secondary | ICD-10-CM | POA: Diagnosis not present

## 2023-05-19 NOTE — Progress Notes (Signed)
Location:   Medical illustrator of Service:  SNF (31) Provider:  Fletcher Anon, NP  Mahlon Gammon, MD  Patient Care Team: Mahlon Gammon, MD as PCP - General (Internal Medicine) Nahser, Deloris Ping, MD as PCP - Cardiology (Cardiology) Vida Rigger, MD as Consulting Physician (Gastroenterology) Levert Feinstein, MD as Consulting Physician (Neurology) Barnett Abu, MD as Consulting Physician (Neurosurgery) [place], Amy, MD as Consulting Physician (Dermatology) Jerene Bears, MD as Consulting Physician (Gynecology) [place], Heather, [place] (Optometry) Mahlon Gammon, MD (Internal Medicine)  Extended Emergency Contact Information Primary Emergency Contact: Dorton,Robert J Address: [address]          [place], [place] [address] [place] Home Phone: [phone] Work Phone: [phone] Mobile Phone: [phone] Relation: Spouse Secondary Emergency Contact: [place] of [place] Home Phone: [phone] Mobile Phone: [phone] Relation: Son  Code Status:  DNR Goals of care: Advanced Directive information    05/19/2023   11:45 AM  Advanced Directives  Does Patient Have a Medical Advance Directive? Yes  Type of Estate agent of [place];Out of facility DNR (pink MOST or yellow form);Living will  Copy of Healthcare Power of Attorney in Chart? Yes - validated most recent copy scanned in chart (See row information)  Pre-existing out of facility DNR order (yellow form or pink MOST form) Pink MOST form placed in chart (order not valid for inpatient use);Yellow form placed in chart (order not valid for inpatient use)     Chief Complaint  Patient presents with   Medical Management of Chronic Issues    HPI:  Pt is a 82 y.o. female seen today for medical management of chronic diseases.   Enrolled in hospice care due to progressive dementia and chronic pain related issues. She was losing weight but now this  has stabilized. Currently ordered boost bid.Needs assistance with all ADLs. Able to communicate needs but needs cuing and prompting.   Had Flu B 05/01/23, received Tamiflu. No residual issues.    Vascular dementia: Hx of TIA on plavix.  Off aricept due to weight loss. No aggression, very pleasant. Has severe anxiety with a long term hx of this. Had some issues with tremors and lethargy, no longer on wellbutrin and clonazepam. No reported mood related issues. MMSE 15/30 02/10/23.     Has chronic back and hip pain. March 2024 MRI of hips Tear of the left gluteus minimus tendon High grade parital near complete tear of the right gluteus minimus tendon insertion  Also has a hx of sciatica with prior surgery and injections.  Currently on neurontin TID. No reports of pain.  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 Street of back injections by Dr. Murray Hodgkins 2016 with good relief, history of cervical DD with possiblity of surgery by Dr. Dutch Quint.   GERD (gastroesophageal reflux disease)    IBS with moderate refluc seen on upper GI study form January 2011 Dr. Ewing Schlein  HTN (hypertension)    no meds   Hyperlipidemia    Hypothyroidism    IBS (irritable bowel syndrome)    Imbalance    Impingement syndrome of right shoulder 01/05/2013   Mass of left side of neck    Memory loss    Mood disorder (HCC)    MVP (mitral valve prolapse)    Hx of    Osteopenia    Peripheral edema    Right rotator cuff tear 01/05/2013   Sciatic pain    recurrent    Seasonal allergies    Stroke [facility])    Tension headache    TIA (transient ischemic attack)    Tremor    Past Surgical History:  Procedure Laterality Date   ANTERIOR CERVICAL DECOMP/DISCECTOMY FUSION  04/2015    Dr. Rise Mu, [facility]   CARDIAC CATHETERIZATION  2004   which revealed smooth and normal coronary arteries   CARPAL TUNNEL RELEASE  2000   lt   CATARACT EXTRACTION W/ INTRAOCULAR LENS  IMPLANT, BILATERAL  1/17   Dr. Delaney Meigs   COSMETIC SURGERY  2010   eyes-facial   DENTAL SURGERY  1960   LASIK     LOOP RECORDER INSERTION N/A 09/22/2017   Procedure: LOOP RECORDER INSERTION;  Surgeon: Regan Lemming, MD;  Location: [facility] INVASIVE CV LAB;  Service: Cardiovascular;  Laterality: N/A;   LUMBAR LAMINECTOMY/DECOMPRESSION MICRODISCECTOMY N/A 06/08/2021   Procedure: Laminectomy and Foraminotomy - L4-L5, L5-S1;  Surgeon: Julio Sicks, MD;  Location: [facility] OR;  Service: Neurosurgery;  Laterality: N/A;   SHOULDER ARTHROSCOPY WITH ROTATOR CUFF REPAIR Right 01/05/2013   Procedure: SHOULDER ARTHROSCOPY WITH ARTHROSCOPIC  ROTATOR CUFF REPAIR, ACROMIOPLASTY, EXTENSIVE DEBRIDEMENT;  Surgeon: Eulas Post, MD;  Location: [facility];  Service: Orthopedics;  Laterality: Right;   TEE WITHOUT CARDIOVERSION N/A 09/22/2017   Procedure: TRANSESOPHAGEAL ECHOCARDIOGRAM (TEE);  Surgeon: Thurmon Fair, MD;  Location: [facility] ENDOSCOPY;  Service: Cardiovascular;  Laterality: N/A;   TONSILLECTOMY     TUBAL LIGATION      Allergies  Allergen Reactions   Fluticasone Other (See Comments)    DRY EYES   Aciphex [Rabeprazole] Other (See Comments)    Critical    Codeine Nausea Only   Codeine Other (See Comments)    unknown   Cymbalta [Duloxetine Hcl] Other (See Comments)    Critical    Lexapro [Escitalopram] Other (See Comments)    critical   Lipitor [Atorvastatin Calcium] Other (See Comments)    Intolerant    Morphine Sulfate     Per matrix   Pneumovax [Pneumococcal Polysaccharide  Vaccine] Other (See Comments)    moderate   Prevacid [Lansoprazole] Other (See Comments)    Unknown, listed on MAR   Rosuvastatin Other (See Comments)    Intolerant    Sertraline Other (See Comments)    critical   Statins Other (See Comments)    Critical    Sulfa Antibiotics Nausea And Vomiting   Tetracyclines & Related Other (See Comments)    Morphine Sulfate   Welchol [Colesevelam] Other (See Comments)    Unknown, listed on MAR   Pneumovax 23 [Pneumococcal Vac Polyvalent] Rash    Injection site reaction    Allergies as of 05/19/2023       Reactions   Fluticasone Other (See Comments)   DRY EYES   Aciphex [rabeprazole] Other (See Comments)   Critical    Codeine Nausea Only   Codeine Other (See Comments)   unknown   Cymbalta [duloxetine Hcl] Other (See  Comments)   Critical   Lexapro [escitalopram] Other (See Comments)   critical   Lipitor [atorvastatin Calcium] Other (See Comments)   Intolerant    Morphine Sulfate    Per matrix   Pneumovax [pneumococcal Polysaccharide Vaccine] Other (See Comments)   moderate   Prevacid [lansoprazole] Other (See Comments)   Unknown, listed on MAR   Rosuvastatin Other (See Comments)   Intolerant    Sertraline Other (See Comments)   critical   Statins Other (See Comments)   Critical   Sulfa Antibiotics Nausea And Vomiting   Tetracyclines & Related Other (See Comments)   Morphine Sulfate   Welchol [colesevelam] Other (See Comments)   Unknown, listed on MAR   Pneumovax 23 [pneumococcal Vac Polyvalent] Rash   Injection site reaction        Medication List        Accurate as of May 19, 2023 11:44 AM. If you have any questions, ask your nurse or doctor.          STOP taking these medications    lidocaine 4 % Stopped by: Fletcher Anon       TAKE these medications    acetaminophen 325 MG tablet Commonly known as: TYLENOL Take 650 mg by mouth every 6 (six) hours as needed.   calcium carbonate 500 MG chewable  tablet Commonly known as: TUMS - dosed in mg elemental calcium Chew 2 tablets by mouth 2 (two) times daily as needed for indigestion or heartburn.   clopidogrel 75 MG tablet Commonly known as: PLAVIX Take 1 tablet (75 mg total) by mouth daily.   gabapentin 100 MG capsule Commonly known as: NEURONTIN Take 100 mg by mouth 3 (three) times daily.   levothyroxine 88 MCG tablet Commonly known as: SYNTHROID Take 88 mcg by mouth daily before breakfast.   memantine 5 MG tablet Commonly known as: NAMENDA Take 5 mg by mouth 2 (two) times daily.   mirabegron ER 25 MG Tb24 tablet Commonly known as: Myrbetriq Take 1 tablet (25 mg total) by mouth daily.   omeprazole 40 MG capsule Commonly known as: PRILOSEC Take 40 mg by mouth daily.   polyethylene glycol 17 g packet Commonly known as: MiraLax Take 17 g by mouth daily.   propranolol 20 MG tablet Commonly known as: INDERAL Take 20 mg by mouth 2 (two) times daily. 1 tablet (20 mg) oral, Twice A Day   Voltaren 1 % Gel Generic drug: diclofenac Sodium Apply 2 g topically 2 (two) times daily as needed (pain).        Review of Systems  Constitutional:  Negative for activity change, appetite change, chills, diaphoresis, fatigue, fever and unexpected weight change.  HENT:  Negative for congestion.   Respiratory:  Negative for cough, shortness of breath and wheezing.   Cardiovascular:  Negative for chest pain, palpitations and leg swelling.  Gastrointestinal:  Negative for abdominal distention, abdominal pain, constipation and diarrhea.  Genitourinary:  Negative for difficulty urinating and dysuria.  Musculoskeletal:  Positive for gait problem. Negative for arthralgias, back pain, joint swelling and myalgias.  Neurological:  Positive for weakness. Negative for dizziness, tremors, seizures, syncope, facial asymmetry, speech difficulty, light-headedness, numbness and headaches.  Psychiatric/Behavioral:  Positive for confusion. Negative for  agitation, behavioral problems, dysphoric mood and hallucinations. The patient is not nervous/anxious.     Immunization History  Administered Date(s) Administered   Fluad Quad(high Dose 65+) 07/02/2022   Influenza, High Dose Seasonal PF 07/23/2017, 07/03/2019   Influenza-Unspecified 05/27/2010, 08/03/2010, 07/22/2011, 07/01/2012,  06/22/2013, 07/18/2020, 07/11/2021, 07/02/2022   Janssen (J&J) SARS-COV-2 Vaccination 11/06/2019   Moderna SARS-COV2 Booster Vaccination 08/05/2021   Moderna Sars-Covid-2 Vaccination 08/07/2020   PFIZER(Purple Top)SARS-COV-2 Vaccination 10/09/2019, 08/07/2020   Pfizer Covid-19 Vaccine Bivalent Booster 23yrs & up 01/21/2023   Pneumococcal Conjugate-13 02/08/2015   Pneumococcal Polysaccharide-23 05/28/2006, 10/13/2011   Pneumococcal-Unspecified 10/17/2013   Td 03/29/2002   Tdap 09/27/2001, 04/25/2002, 08/18/2012, 10/17/2013   Zoster Recombinant(Shingrix) 05/23/2021, 07/23/2021   Zoster, Live 08/30/2006   Zoster, Unspecified 08/30/2006   Pertinent  Health Maintenance Due  Topic Date Due   INFLUENZA VACCINE  04/28/2023   DEXA SCAN  Completed      08/07/2022   11:17 AM 08/07/2022   11:03 PM 08/08/2022    8:00 PM 08/09/2022    8:00 AM 08/17/2022   10:59 AM  Fall Risk  Falls in the past year?     1  Was there an injury with Fall?     1  Fall Risk Category Calculator     3  Fall Risk Category (Retired)     High  (RETIRED) Patient Fall Risk Level High fall risk High fall risk High fall risk High fall risk High fall risk  Patient at Risk for Falls Due to     History of fall(s);Impaired balance/gait;Impaired mobility  Fall risk Follow up     Falls evaluation completed   Functional Status Survey:    Vitals:   05/19/23 1134  BP: 119/79  Pulse: 74  Resp: 20  Temp: (!) 97.2 F (36.2 C)  SpO2: 92%  Weight: 145 lb 9.6 oz (66 kg)   Body mass index is 27.51 kg/m. Wt Readings from Last 3 Encounters:  05/19/23 145 lb 9.6 oz (66 kg)  03/17/23 143 lb 6.4  oz (65 kg)  02/18/23 146 lb (66.2 kg)    Physical Exam Vitals and nursing note reviewed.  Constitutional:      General: She is not in acute distress.    Appearance: She is not diaphoretic.  HENT:     Head: Normocephalic and atraumatic.     Nose: Nose normal.     Mouth/Throat:     Mouth: Mucous membranes are moist.     Pharynx: Oropharynx is clear.  Eyes:     Conjunctiva/sclera: Conjunctivae normal.     Pupils: Pupils are equal, round, and reactive to light.  Neck:     Vascular: No JVD.  Cardiovascular:     Rate and Rhythm: Normal rate and regular rhythm.     Heart sounds: No murmur heard. Pulmonary:     Effort: Pulmonary effort is normal. No respiratory distress.     Breath sounds: Normal breath sounds. No wheezing.  Abdominal:     General: Bowel sounds are normal. There is no distension.     Palpations: Abdomen is soft.  Musculoskeletal:     Comments: Varicose veins. Trace edema to both ankles.   Skin:    General: Skin is warm and dry.     Comments: Blue discoloration both both lower ext chronic.   Neurological:     General: No focal deficit present.     Mental Status: She is alert. Mental status is at baseline.  Psychiatric:        Mood and Affect: Mood normal.     Labs reviewed: Recent Labs    08/07/22 1122 08/07/22 1615 08/07/22 2343 12/22/22 1850 01/24/23 0000 01/25/23 0000  NA 141  --    < > 138 136* 137  K  4.2  --    < > 4.1 4.4 4.3  CL 104  --    < > 103 100 100  CO2 29  --    < > 25 25* 24*  GLUCOSE 99  --   --  108*  --   --   BUN 14  --    < > 11 12 12   CREATININE 0.95  --    < > 0.76 0.7 0.6  CALCIUM 9.2  --    < > 8.6* 8.6* 8.6*  MG  --  2.2  --   --   --   --   PHOS  --  4.0  --   --   --   --    < > = values in this interval not displayed.   Recent Labs    08/07/22 1615 08/16/22 0000 08/16/22 1300 12/22/22 1850 01/24/23 0000  AST 20   < > 16 16 27   ALT 14   < > 14 21 36*  ALKPHOS 69   < > 100 109 167*  BILITOT 0.5  --   --  0.8   --   PROT 6.8  --   --  6.9  --   ALBUMIN 3.6   < > 4.0 3.1* 3.7   < > = values in this interval not displayed.   Recent Labs    08/07/22 1122 08/07/22 2343 08/18/22 0000 12/22/22 1850 01/24/23 0000  WBC 9.9  --  7.2 9.7 9.3  NEUTROABS 7.5  --   --   --  7.10  HGB 14.1   < > 13.5 13.4 13.1  HCT 43.4   < > 41 41.3 38  MCV 98.9  --   --  96.9  --   PLT 301  --  298 350 341   < > = values in this interval not displayed.   Lab Results  Component Value Date   TSH 0.57 01/25/2023   Lab Results  Component Value Date   HGBA1C 5.6 09/21/2017   Lab Results  Component Value Date   CHOL 174 03/23/2022   HDL 66 03/23/2022   LDLCALC 81 03/23/2022   TRIG 134 03/23/2022   CHOLHDL 2.1 09/21/2017    Significant Diagnostic Results in last 30 days:  No results found.  Assessment/Plan  1. Vascular dementia without behavioral disturbance (HCC) Under hospice care with progressive dementia consistent with the disease.  After a period of more steep declined things seemed to have stabilized.  Continue Namenda.    2. Chronic bilateral low back pain with bilateral sciatica Pain is well controlled with neurontin   3. Drug-induced constipation Continue miralax.   4. Tremor Continue propranolol   5. Hx of transient ischemic attack (TIA) Continue Plavix.   6. Overactive bladder Continue myrbetriq.    Family/ staff Communication: nurse  Labs/tests ordered:  NA

## 2023-05-29 DIAGNOSIS — F0154 Vascular dementia, unspecified severity, with anxiety: Secondary | ICD-10-CM | POA: Diagnosis not present

## 2023-05-29 DIAGNOSIS — I69318 Other symptoms and signs involving cognitive functions following cerebral infarction: Secondary | ICD-10-CM | POA: Diagnosis not present

## 2023-05-29 DIAGNOSIS — M48061 Spinal stenosis, lumbar region without neurogenic claudication: Secondary | ICD-10-CM | POA: Diagnosis not present

## 2023-05-29 DIAGNOSIS — E039 Hypothyroidism, unspecified: Secondary | ICD-10-CM | POA: Diagnosis not present

## 2023-05-29 DIAGNOSIS — R634 Abnormal weight loss: Secondary | ICD-10-CM | POA: Diagnosis not present

## 2023-05-29 DIAGNOSIS — E785 Hyperlipidemia, unspecified: Secondary | ICD-10-CM | POA: Diagnosis not present

## 2023-05-29 DIAGNOSIS — I672 Cerebral atherosclerosis: Secondary | ICD-10-CM | POA: Diagnosis not present

## 2023-05-29 DIAGNOSIS — Z9181 History of falling: Secondary | ICD-10-CM | POA: Diagnosis not present

## 2023-05-29 DIAGNOSIS — K219 Gastro-esophageal reflux disease without esophagitis: Secondary | ICD-10-CM | POA: Diagnosis not present

## 2023-05-29 DIAGNOSIS — F0153 Vascular dementia, unspecified severity, with mood disturbance: Secondary | ICD-10-CM | POA: Diagnosis not present

## 2023-05-29 DIAGNOSIS — S2243XD Multiple fractures of ribs, bilateral, subsequent encounter for fracture with routine healing: Secondary | ICD-10-CM | POA: Diagnosis not present

## 2023-05-29 DIAGNOSIS — I10 Essential (primary) hypertension: Secondary | ICD-10-CM | POA: Diagnosis not present

## 2023-05-30 DIAGNOSIS — R634 Abnormal weight loss: Secondary | ICD-10-CM | POA: Diagnosis not present

## 2023-05-30 DIAGNOSIS — F0154 Vascular dementia, unspecified severity, with anxiety: Secondary | ICD-10-CM | POA: Diagnosis not present

## 2023-05-30 DIAGNOSIS — I69318 Other symptoms and signs involving cognitive functions following cerebral infarction: Secondary | ICD-10-CM | POA: Diagnosis not present

## 2023-05-30 DIAGNOSIS — I672 Cerebral atherosclerosis: Secondary | ICD-10-CM | POA: Diagnosis not present

## 2023-05-30 DIAGNOSIS — I10 Essential (primary) hypertension: Secondary | ICD-10-CM | POA: Diagnosis not present

## 2023-05-30 DIAGNOSIS — F0153 Vascular dementia, unspecified severity, with mood disturbance: Secondary | ICD-10-CM | POA: Diagnosis not present

## 2023-05-31 DIAGNOSIS — R634 Abnormal weight loss: Secondary | ICD-10-CM | POA: Diagnosis not present

## 2023-05-31 DIAGNOSIS — I672 Cerebral atherosclerosis: Secondary | ICD-10-CM | POA: Diagnosis not present

## 2023-05-31 DIAGNOSIS — F0154 Vascular dementia, unspecified severity, with anxiety: Secondary | ICD-10-CM | POA: Diagnosis not present

## 2023-05-31 DIAGNOSIS — F0153 Vascular dementia, unspecified severity, with mood disturbance: Secondary | ICD-10-CM | POA: Diagnosis not present

## 2023-05-31 DIAGNOSIS — I10 Essential (primary) hypertension: Secondary | ICD-10-CM | POA: Diagnosis not present

## 2023-05-31 DIAGNOSIS — I69318 Other symptoms and signs involving cognitive functions following cerebral infarction: Secondary | ICD-10-CM | POA: Diagnosis not present

## 2023-06-01 DIAGNOSIS — F0153 Vascular dementia, unspecified severity, with mood disturbance: Secondary | ICD-10-CM | POA: Diagnosis not present

## 2023-06-01 DIAGNOSIS — I10 Essential (primary) hypertension: Secondary | ICD-10-CM | POA: Diagnosis not present

## 2023-06-01 DIAGNOSIS — I672 Cerebral atherosclerosis: Secondary | ICD-10-CM | POA: Diagnosis not present

## 2023-06-01 DIAGNOSIS — R634 Abnormal weight loss: Secondary | ICD-10-CM | POA: Diagnosis not present

## 2023-06-01 DIAGNOSIS — I69318 Other symptoms and signs involving cognitive functions following cerebral infarction: Secondary | ICD-10-CM | POA: Diagnosis not present

## 2023-06-01 DIAGNOSIS — F0154 Vascular dementia, unspecified severity, with anxiety: Secondary | ICD-10-CM | POA: Diagnosis not present

## 2023-06-02 DIAGNOSIS — I10 Essential (primary) hypertension: Secondary | ICD-10-CM | POA: Diagnosis not present

## 2023-06-02 DIAGNOSIS — R634 Abnormal weight loss: Secondary | ICD-10-CM | POA: Diagnosis not present

## 2023-06-02 DIAGNOSIS — I69318 Other symptoms and signs involving cognitive functions following cerebral infarction: Secondary | ICD-10-CM | POA: Diagnosis not present

## 2023-06-02 DIAGNOSIS — F0153 Vascular dementia, unspecified severity, with mood disturbance: Secondary | ICD-10-CM | POA: Diagnosis not present

## 2023-06-02 DIAGNOSIS — F0154 Vascular dementia, unspecified severity, with anxiety: Secondary | ICD-10-CM | POA: Diagnosis not present

## 2023-06-02 DIAGNOSIS — I672 Cerebral atherosclerosis: Secondary | ICD-10-CM | POA: Diagnosis not present

## 2023-06-03 DIAGNOSIS — I69318 Other symptoms and signs involving cognitive functions following cerebral infarction: Secondary | ICD-10-CM | POA: Diagnosis not present

## 2023-06-03 DIAGNOSIS — I672 Cerebral atherosclerosis: Secondary | ICD-10-CM | POA: Diagnosis not present

## 2023-06-03 DIAGNOSIS — F0153 Vascular dementia, unspecified severity, with mood disturbance: Secondary | ICD-10-CM | POA: Diagnosis not present

## 2023-06-03 DIAGNOSIS — R634 Abnormal weight loss: Secondary | ICD-10-CM | POA: Diagnosis not present

## 2023-06-03 DIAGNOSIS — I10 Essential (primary) hypertension: Secondary | ICD-10-CM | POA: Diagnosis not present

## 2023-06-03 DIAGNOSIS — F0154 Vascular dementia, unspecified severity, with anxiety: Secondary | ICD-10-CM | POA: Diagnosis not present

## 2023-06-04 DIAGNOSIS — I10 Essential (primary) hypertension: Secondary | ICD-10-CM | POA: Diagnosis not present

## 2023-06-04 DIAGNOSIS — F0154 Vascular dementia, unspecified severity, with anxiety: Secondary | ICD-10-CM | POA: Diagnosis not present

## 2023-06-04 DIAGNOSIS — I672 Cerebral atherosclerosis: Secondary | ICD-10-CM | POA: Diagnosis not present

## 2023-06-04 DIAGNOSIS — R634 Abnormal weight loss: Secondary | ICD-10-CM | POA: Diagnosis not present

## 2023-06-04 DIAGNOSIS — I69318 Other symptoms and signs involving cognitive functions following cerebral infarction: Secondary | ICD-10-CM | POA: Diagnosis not present

## 2023-06-04 DIAGNOSIS — F0153 Vascular dementia, unspecified severity, with mood disturbance: Secondary | ICD-10-CM | POA: Diagnosis not present

## 2023-06-05 DIAGNOSIS — I10 Essential (primary) hypertension: Secondary | ICD-10-CM | POA: Diagnosis not present

## 2023-06-05 DIAGNOSIS — I672 Cerebral atherosclerosis: Secondary | ICD-10-CM | POA: Diagnosis not present

## 2023-06-05 DIAGNOSIS — R634 Abnormal weight loss: Secondary | ICD-10-CM | POA: Diagnosis not present

## 2023-06-05 DIAGNOSIS — F0154 Vascular dementia, unspecified severity, with anxiety: Secondary | ICD-10-CM | POA: Diagnosis not present

## 2023-06-05 DIAGNOSIS — I69318 Other symptoms and signs involving cognitive functions following cerebral infarction: Secondary | ICD-10-CM | POA: Diagnosis not present

## 2023-06-05 DIAGNOSIS — F0153 Vascular dementia, unspecified severity, with mood disturbance: Secondary | ICD-10-CM | POA: Diagnosis not present

## 2023-06-06 DIAGNOSIS — R634 Abnormal weight loss: Secondary | ICD-10-CM | POA: Diagnosis not present

## 2023-06-06 DIAGNOSIS — F0154 Vascular dementia, unspecified severity, with anxiety: Secondary | ICD-10-CM | POA: Diagnosis not present

## 2023-06-06 DIAGNOSIS — I672 Cerebral atherosclerosis: Secondary | ICD-10-CM | POA: Diagnosis not present

## 2023-06-06 DIAGNOSIS — I10 Essential (primary) hypertension: Secondary | ICD-10-CM | POA: Diagnosis not present

## 2023-06-06 DIAGNOSIS — F0153 Vascular dementia, unspecified severity, with mood disturbance: Secondary | ICD-10-CM | POA: Diagnosis not present

## 2023-06-06 DIAGNOSIS — I69318 Other symptoms and signs involving cognitive functions following cerebral infarction: Secondary | ICD-10-CM | POA: Diagnosis not present

## 2023-06-07 DIAGNOSIS — R634 Abnormal weight loss: Secondary | ICD-10-CM | POA: Diagnosis not present

## 2023-06-07 DIAGNOSIS — F0154 Vascular dementia, unspecified severity, with anxiety: Secondary | ICD-10-CM | POA: Diagnosis not present

## 2023-06-07 DIAGNOSIS — F0153 Vascular dementia, unspecified severity, with mood disturbance: Secondary | ICD-10-CM | POA: Diagnosis not present

## 2023-06-07 DIAGNOSIS — I672 Cerebral atherosclerosis: Secondary | ICD-10-CM | POA: Diagnosis not present

## 2023-06-07 DIAGNOSIS — I69318 Other symptoms and signs involving cognitive functions following cerebral infarction: Secondary | ICD-10-CM | POA: Diagnosis not present

## 2023-06-07 DIAGNOSIS — I10 Essential (primary) hypertension: Secondary | ICD-10-CM | POA: Diagnosis not present

## 2023-06-08 DIAGNOSIS — I672 Cerebral atherosclerosis: Secondary | ICD-10-CM | POA: Diagnosis not present

## 2023-06-08 DIAGNOSIS — F0154 Vascular dementia, unspecified severity, with anxiety: Secondary | ICD-10-CM | POA: Diagnosis not present

## 2023-06-08 DIAGNOSIS — I10 Essential (primary) hypertension: Secondary | ICD-10-CM | POA: Diagnosis not present

## 2023-06-08 DIAGNOSIS — R634 Abnormal weight loss: Secondary | ICD-10-CM | POA: Diagnosis not present

## 2023-06-08 DIAGNOSIS — F0153 Vascular dementia, unspecified severity, with mood disturbance: Secondary | ICD-10-CM | POA: Diagnosis not present

## 2023-06-08 DIAGNOSIS — I69318 Other symptoms and signs involving cognitive functions following cerebral infarction: Secondary | ICD-10-CM | POA: Diagnosis not present

## 2023-06-09 DIAGNOSIS — I69318 Other symptoms and signs involving cognitive functions following cerebral infarction: Secondary | ICD-10-CM | POA: Diagnosis not present

## 2023-06-09 DIAGNOSIS — R634 Abnormal weight loss: Secondary | ICD-10-CM | POA: Diagnosis not present

## 2023-06-09 DIAGNOSIS — F0154 Vascular dementia, unspecified severity, with anxiety: Secondary | ICD-10-CM | POA: Diagnosis not present

## 2023-06-09 DIAGNOSIS — I10 Essential (primary) hypertension: Secondary | ICD-10-CM | POA: Diagnosis not present

## 2023-06-09 DIAGNOSIS — F0153 Vascular dementia, unspecified severity, with mood disturbance: Secondary | ICD-10-CM | POA: Diagnosis not present

## 2023-06-09 DIAGNOSIS — I672 Cerebral atherosclerosis: Secondary | ICD-10-CM | POA: Diagnosis not present

## 2023-06-10 DIAGNOSIS — I672 Cerebral atherosclerosis: Secondary | ICD-10-CM | POA: Diagnosis not present

## 2023-06-10 DIAGNOSIS — I69318 Other symptoms and signs involving cognitive functions following cerebral infarction: Secondary | ICD-10-CM | POA: Diagnosis not present

## 2023-06-10 DIAGNOSIS — F0153 Vascular dementia, unspecified severity, with mood disturbance: Secondary | ICD-10-CM | POA: Diagnosis not present

## 2023-06-10 DIAGNOSIS — R634 Abnormal weight loss: Secondary | ICD-10-CM | POA: Diagnosis not present

## 2023-06-10 DIAGNOSIS — I10 Essential (primary) hypertension: Secondary | ICD-10-CM | POA: Diagnosis not present

## 2023-06-10 DIAGNOSIS — F0154 Vascular dementia, unspecified severity, with anxiety: Secondary | ICD-10-CM | POA: Diagnosis not present

## 2023-06-11 DIAGNOSIS — F0154 Vascular dementia, unspecified severity, with anxiety: Secondary | ICD-10-CM | POA: Diagnosis not present

## 2023-06-11 DIAGNOSIS — I672 Cerebral atherosclerosis: Secondary | ICD-10-CM | POA: Diagnosis not present

## 2023-06-11 DIAGNOSIS — R634 Abnormal weight loss: Secondary | ICD-10-CM | POA: Diagnosis not present

## 2023-06-11 DIAGNOSIS — I10 Essential (primary) hypertension: Secondary | ICD-10-CM | POA: Diagnosis not present

## 2023-06-11 DIAGNOSIS — F0153 Vascular dementia, unspecified severity, with mood disturbance: Secondary | ICD-10-CM | POA: Diagnosis not present

## 2023-06-11 DIAGNOSIS — I69318 Other symptoms and signs involving cognitive functions following cerebral infarction: Secondary | ICD-10-CM | POA: Diagnosis not present

## 2023-06-12 DIAGNOSIS — F0153 Vascular dementia, unspecified severity, with mood disturbance: Secondary | ICD-10-CM | POA: Diagnosis not present

## 2023-06-12 DIAGNOSIS — I672 Cerebral atherosclerosis: Secondary | ICD-10-CM | POA: Diagnosis not present

## 2023-06-12 DIAGNOSIS — R634 Abnormal weight loss: Secondary | ICD-10-CM | POA: Diagnosis not present

## 2023-06-12 DIAGNOSIS — I69318 Other symptoms and signs involving cognitive functions following cerebral infarction: Secondary | ICD-10-CM | POA: Diagnosis not present

## 2023-06-12 DIAGNOSIS — F0154 Vascular dementia, unspecified severity, with anxiety: Secondary | ICD-10-CM | POA: Diagnosis not present

## 2023-06-12 DIAGNOSIS — I10 Essential (primary) hypertension: Secondary | ICD-10-CM | POA: Diagnosis not present

## 2023-06-13 DIAGNOSIS — I10 Essential (primary) hypertension: Secondary | ICD-10-CM | POA: Diagnosis not present

## 2023-06-13 DIAGNOSIS — I69318 Other symptoms and signs involving cognitive functions following cerebral infarction: Secondary | ICD-10-CM | POA: Diagnosis not present

## 2023-06-13 DIAGNOSIS — F0154 Vascular dementia, unspecified severity, with anxiety: Secondary | ICD-10-CM | POA: Diagnosis not present

## 2023-06-13 DIAGNOSIS — I672 Cerebral atherosclerosis: Secondary | ICD-10-CM | POA: Diagnosis not present

## 2023-06-13 DIAGNOSIS — F0153 Vascular dementia, unspecified severity, with mood disturbance: Secondary | ICD-10-CM | POA: Diagnosis not present

## 2023-06-13 DIAGNOSIS — R634 Abnormal weight loss: Secondary | ICD-10-CM | POA: Diagnosis not present

## 2023-06-14 DIAGNOSIS — I10 Essential (primary) hypertension: Secondary | ICD-10-CM | POA: Diagnosis not present

## 2023-06-14 DIAGNOSIS — F0154 Vascular dementia, unspecified severity, with anxiety: Secondary | ICD-10-CM | POA: Diagnosis not present

## 2023-06-14 DIAGNOSIS — I69318 Other symptoms and signs involving cognitive functions following cerebral infarction: Secondary | ICD-10-CM | POA: Diagnosis not present

## 2023-06-14 DIAGNOSIS — R634 Abnormal weight loss: Secondary | ICD-10-CM | POA: Diagnosis not present

## 2023-06-14 DIAGNOSIS — F0153 Vascular dementia, unspecified severity, with mood disturbance: Secondary | ICD-10-CM | POA: Diagnosis not present

## 2023-06-14 DIAGNOSIS — I672 Cerebral atherosclerosis: Secondary | ICD-10-CM | POA: Diagnosis not present

## 2023-06-15 DIAGNOSIS — R634 Abnormal weight loss: Secondary | ICD-10-CM | POA: Diagnosis not present

## 2023-06-15 DIAGNOSIS — F0154 Vascular dementia, unspecified severity, with anxiety: Secondary | ICD-10-CM | POA: Diagnosis not present

## 2023-06-15 DIAGNOSIS — I672 Cerebral atherosclerosis: Secondary | ICD-10-CM | POA: Diagnosis not present

## 2023-06-15 DIAGNOSIS — I10 Essential (primary) hypertension: Secondary | ICD-10-CM | POA: Diagnosis not present

## 2023-06-15 DIAGNOSIS — I69318 Other symptoms and signs involving cognitive functions following cerebral infarction: Secondary | ICD-10-CM | POA: Diagnosis not present

## 2023-06-15 DIAGNOSIS — F0153 Vascular dementia, unspecified severity, with mood disturbance: Secondary | ICD-10-CM | POA: Diagnosis not present

## 2023-06-16 ENCOUNTER — Non-Acute Institutional Stay (SKILLED_NURSING_FACILITY): Payer: Medicare Other | Admitting: Adult Health

## 2023-06-16 ENCOUNTER — Encounter: Payer: Self-pay | Admitting: Adult Health

## 2023-06-16 DIAGNOSIS — R251 Tremor, unspecified: Secondary | ICD-10-CM | POA: Diagnosis not present

## 2023-06-16 DIAGNOSIS — N3281 Overactive bladder: Secondary | ICD-10-CM | POA: Diagnosis not present

## 2023-06-16 DIAGNOSIS — G8929 Other chronic pain: Secondary | ICD-10-CM | POA: Diagnosis not present

## 2023-06-16 DIAGNOSIS — E039 Hypothyroidism, unspecified: Secondary | ICD-10-CM

## 2023-06-16 DIAGNOSIS — R634 Abnormal weight loss: Secondary | ICD-10-CM | POA: Diagnosis not present

## 2023-06-16 DIAGNOSIS — F0154 Vascular dementia, unspecified severity, with anxiety: Secondary | ICD-10-CM | POA: Diagnosis not present

## 2023-06-16 DIAGNOSIS — K5903 Drug induced constipation: Secondary | ICD-10-CM | POA: Diagnosis not present

## 2023-06-16 DIAGNOSIS — I672 Cerebral atherosclerosis: Secondary | ICD-10-CM | POA: Diagnosis not present

## 2023-06-16 DIAGNOSIS — M5442 Lumbago with sciatica, left side: Secondary | ICD-10-CM | POA: Diagnosis not present

## 2023-06-16 DIAGNOSIS — F0153 Vascular dementia, unspecified severity, with mood disturbance: Secondary | ICD-10-CM | POA: Diagnosis not present

## 2023-06-16 DIAGNOSIS — M5441 Lumbago with sciatica, right side: Secondary | ICD-10-CM

## 2023-06-16 DIAGNOSIS — F0150 Vascular dementia without behavioral disturbance: Secondary | ICD-10-CM | POA: Diagnosis not present

## 2023-06-16 DIAGNOSIS — I10 Essential (primary) hypertension: Secondary | ICD-10-CM | POA: Diagnosis not present

## 2023-06-16 DIAGNOSIS — I69318 Other symptoms and signs involving cognitive functions following cerebral infarction: Secondary | ICD-10-CM | POA: Diagnosis not present

## 2023-06-16 NOTE — Progress Notes (Signed)
Location:  [facility] Room Number: 146A Place of Service:  SNF [phone]) Provider:  Tamsen Roers, MD  Patient Care Team: Mahlon Gammon, MD as PCP - General (Internal Medicine) Nahser, Deloris Ping, MD as PCP - Cardiology (Cardiology) Vida Rigger, MD as Consulting Physician (Gastroenterology) Levert Feinstein, MD as Consulting Physician (Neurology) Barnett Abu, MD as Consulting Physician (Neurosurgery) [place], Amy, MD as Consulting Physician (Dermatology) Jerene Bears, MD as Consulting Physician (Gynecology) [place], Heather, OD (Optometry) Mahlon Gammon, MD (Internal Medicine)  Extended Emergency Contact Information Primary Emergency Contact: Millar,Robert J Address: [address]          [place], [place] [address] [place] Home Phone: [phone] Work Phone: [phone] Mobile Phone: [phone] Relation: Spouse Secondary Emergency Contact: [place] of [place] Home Phone: [phone] Mobile Phone: [phone] Relation: Son  Code Status:  DNR Hospice Goals of care: Advanced Directive information    06/16/2023   12:43 PM  Advanced Directives  Does Patient Have a Medical Advance Directive? Yes  Type of Estate agent of [place];Out of facility DNR (pink MOST or yellow form);Living will  Does patient want to make changes to medical advance directive? No - Patient declined  Copy of Healthcare Power of Attorney in Chart? Yes - validated most recent copy scanned in chart (See row information)  Pre-existing out of facility DNR order (yellow form or pink MOST form) Pink MOST form placed in chart (order not valid for inpatient use);Yellow form placed in chart (order not valid for inpatient use)     Chief Complaint  Patient presents with   Medical Management of Chronic Issues    Patient is being seen for routine visit   Immunizations    Patient is due for Flu and  covid vaccine     HPI:  Pt is a 82 y.o. female seen today for medical management of chronic diseases.   Enrolled in hospice care due to progressive dementia and chronic pain related issues. She was losing weight but now her weight is trending upward and boost was made prn.Needs assistance with all ADLs. Able to communicate needs but needs cuing and prompting.   Vascular dementia: Hx of TIA on plavix.  Off aricept due to weight loss. No aggression, very pleasant. Has severe anxiety with a long term hx of this. Had some issues with tremors and lethargy, no longer on wellbutrin and clonazepam. No reported mood related issues. MMSE 15/30 02/10/23.     Has chronic back and hip pain. March 2024 MRI of hips Tear of the left gluteus minimus tendon High grade parital near complete tear of the right gluteus minimus tendon insertion  Also has a hx of sciatica with prior surgery and injections.  Currently on neurontin TID. No reports of pain.  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 Street of back injections by Dr. Murray Hodgkins 2016 with good relief, history of cervical  DD with possiblity of surgery by Dr. Dutch Quint.   GERD (gastroesophageal reflux disease)    IBS with moderate refluc seen on upper GI study form January 2011 Dr. Ewing Schlein   HTN (hypertension)    no meds   Hyperlipidemia    Hypothyroidism    IBS (irritable bowel syndrome)    Imbalance    Impingement syndrome of right shoulder 01/05/2013   Mass of left side of neck    Memory  loss    Mood disorder (HCC)    MVP (mitral valve prolapse)    Hx of    Osteopenia    Peripheral edema    Right rotator cuff tear 01/05/2013   Sciatic pain    recurrent   Seasonal allergies    Stroke [facility])    Tension headache    TIA (transient ischemic attack)    Tremor    Past Surgical History:  Procedure Laterality Date   ANTERIOR CERVICAL DECOMP/DISCECTOMY FUSION  04/2015    Dr. Rise Mu, [facility]   CARDIAC CATHETERIZATION  2004   which revealed smooth and normal coronary arteries   CARPAL TUNNEL RELEASE  2000   lt   CATARACT EXTRACTION W/ INTRAOCULAR LENS  IMPLANT, BILATERAL  1/17   Dr. Delaney Meigs   COSMETIC SURGERY  2010   eyes-facial   DENTAL SURGERY  1960   LASIK     LOOP RECORDER INSERTION N/A 09/22/2017   Procedure: LOOP RECORDER INSERTION;  Surgeon: Regan Lemming, MD;  Location: [facility] INVASIVE CV LAB;  Service: Cardiovascular;  Laterality: N/A;   LUMBAR LAMINECTOMY/DECOMPRESSION MICRODISCECTOMY N/A 06/08/2021   Procedure: Laminectomy and Foraminotomy - L4-L5, L5-S1;  Surgeon: Julio Sicks, MD;  Location: [facility] OR;  Service: Neurosurgery;  Laterality: N/A;   SHOULDER ARTHROSCOPY WITH ROTATOR CUFF REPAIR Right 01/05/2013   Procedure: SHOULDER ARTHROSCOPY WITH ARTHROSCOPIC  ROTATOR CUFF REPAIR, ACROMIOPLASTY, EXTENSIVE DEBRIDEMENT;  Surgeon: Eulas Post, MD;  Location: [facility];  Service: Orthopedics;  Laterality: Right;   TEE WITHOUT CARDIOVERSION N/A 09/22/2017   Procedure: TRANSESOPHAGEAL ECHOCARDIOGRAM (TEE);  Surgeon: Thurmon Fair, MD;  Location: [facility] ENDOSCOPY;  Service: Cardiovascular;  Laterality: N/A;   TONSILLECTOMY     TUBAL LIGATION      Allergies  Allergen Reactions   Fluticasone Other (See Comments)    DRY EYES   Aciphex [Rabeprazole] Other (See Comments)    Critical    Codeine Nausea Only   Codeine Other (See Comments)    unknown   Cymbalta [Duloxetine Hcl] Other (See Comments)    Critical    Lexapro [Escitalopram] Other (See  Comments)    critical   Lipitor [Atorvastatin Calcium] Other (See Comments)    Intolerant    Morphine Sulfate     Per matrix   Pneumovax [Pneumococcal Polysaccharide Vaccine] Other (See Comments)    moderate   Prevacid [Lansoprazole] Other (See Comments)    Unknown, listed on MAR   Rosuvastatin Other (See Comments)    Intolerant    Sertraline Other (See Comments)    critical   Statins Other (See Comments)    Critical    Sulfa Antibiotics Nausea And Vomiting   Tetracyclines & Related Other (See Comments)    Morphine Sulfate   Welchol [Colesevelam] Other (See Comments)    Unknown, listed on MAR   Pneumovax 23 [Pneumococcal Vac Polyvalent] Rash    Injection site reaction    Outpatient Encounter Medications as of 06/16/2023  Medication Sig   acetaminophen (TYLENOL) 325 MG tablet Take 650 mg by  mouth every 6 (six) hours as needed.   clopidogrel (PLAVIX) 75 MG tablet Take 1 tablet (75 mg total) by mouth daily.   diclofenac Sodium (VOLTAREN) 1 % GEL Apply 2 g topically 2 (two) times daily as needed (pain).   gabapentin (NEURONTIN) 100 MG capsule Take 100 mg by mouth 3 (three) times daily.   levothyroxine (SYNTHROID) 88 MCG tablet Take 88 mcg by mouth daily before breakfast.   memantine (NAMENDA) 5 MG tablet Take 5 mg by mouth 2 (two) times daily.   mirabegron ER (MYRBETRIQ) 25 MG TB24 tablet Take 1 tablet (25 mg total) by mouth daily.   omeprazole (PRILOSEC) 40 MG capsule Take 40 mg by mouth daily.   polyethylene glycol (MIRALAX) 17 g packet Take 17 g by mouth daily.   propranolol (INDERAL) 20 MG tablet Take 20 mg by mouth 2 (two) times daily. 1 tablet (20 mg) oral, Twice A Day   calcium carbonate (TUMS - DOSED IN MG ELEMENTAL CALCIUM) 500 MG chewable tablet Chew 2 tablets by mouth 2 (two) times daily as needed for indigestion or heartburn. (Patient not taking: Reported on 06/16/2023)   No facility-administered encounter medications on file as of 06/16/2023.    Review of Systems   Constitutional:  Negative for activity change, appetite change, chills, diaphoresis, fatigue, fever and unexpected weight change.  HENT:  Negative for congestion.   Respiratory:  Negative for cough, shortness of breath and wheezing.   Cardiovascular:  Negative for chest pain, palpitations and leg swelling.  Gastrointestinal:  Negative for abdominal distention, abdominal pain, constipation and diarrhea.  Genitourinary:  Negative for difficulty urinating and dysuria.  Musculoskeletal:  Positive for arthralgias and gait problem. Negative for back pain, joint swelling and myalgias.  Neurological:  Negative for dizziness, tremors, seizures, syncope, facial asymmetry, speech difficulty, weakness, light-headedness, numbness and headaches.  Psychiatric/Behavioral:  Positive for confusion. Negative for agitation and behavioral problems.     Immunization History  Administered Date(s) Administered   Fluad Quad(high Dose 65+) 07/02/2022   Influenza, High Dose Seasonal PF 07/23/2017, 07/03/2019   Influenza-Unspecified 05/27/2010, 08/03/2010, 07/22/2011, 07/01/2012, 06/22/2013, 07/18/2020, 07/11/2021, 07/02/2022   Janssen (J&J) SARS-COV-2 Vaccination 11/06/2019   Moderna SARS-COV2 Booster Vaccination 08/05/2021   Moderna Sars-Covid-2 Vaccination 08/07/2020   PFIZER(Purple Top)SARS-COV-2 Vaccination 10/09/2019, 08/07/2020   Pfizer Covid-19 Vaccine Bivalent Booster 74yrs & up 01/21/2023   Pneumococcal Conjugate-13 02/08/2015   Pneumococcal Polysaccharide-23 05/28/2006, 10/13/2011   Pneumococcal-Unspecified 10/17/2013   Td 03/29/2002   Tdap 09/27/2001, 04/25/2002, 08/18/2012, 10/17/2013   Zoster Recombinant(Shingrix) 05/23/2021, 07/23/2021   Zoster, Live 08/30/2006   Zoster, Unspecified 08/30/2006   Pertinent  Health Maintenance Due  Topic Date Due   INFLUENZA VACCINE  04/28/2023   DEXA SCAN  Completed      08/07/2022   11:17 AM 08/07/2022   11:03 PM 08/08/2022    8:00 PM 08/09/2022    8:00  AM 08/17/2022   10:59 AM  Fall Risk  Falls in the past year?     1  Was there an injury with Fall?     1  Fall Risk Category Calculator     3  Fall Risk Category (Retired)     High  (RETIRED) Patient Fall Risk Level High fall risk High fall risk High fall risk High fall risk High fall risk  Patient at Risk for Falls Due to     History of fall(s);Impaired balance/gait;Impaired mobility  Fall risk Follow up     Falls evaluation completed   Functional Status  Survey:    Vitals:   06/16/23 1234  BP: 120/75  Pulse: 82  Resp: 16  Temp: 97.8 F (36.6 C)  TempSrc: Temporal  SpO2: 97%  Weight: 147 lb 3.2 oz (66.8 kg)  Height: 5\' 1"  (1.549 m)   Body mass index is 27.81 kg/m. Wt Readings from Last 3 Encounters:  06/16/23 147 lb 3.2 oz (66.8 kg)  05/19/23 145 lb 9.6 oz (66 kg)  03/17/23 143 lb 6.4 oz (65 kg)    Physical Exam Vitals and nursing note reviewed.  Constitutional:      General: She is not in acute distress.    Appearance: She is not diaphoretic.  HENT:     Head: Normocephalic and atraumatic.     Right Ear: Tympanic membrane normal.     Left Ear: Tympanic membrane normal.     Nose: Nose normal.     Mouth/Throat:     Mouth: Mucous membranes are moist.     Pharynx: Oropharynx is clear.  Neck:     Vascular: No JVD.  Cardiovascular:     Rate and Rhythm: Normal rate and regular rhythm.     Heart sounds: No murmur heard. Pulmonary:     Effort: Pulmonary effort is normal. No respiratory distress.     Breath sounds: Normal breath sounds. No wheezing.  Abdominal:     General: Bowel sounds are normal. There is no distension.     Palpations: Abdomen is soft.     Tenderness: There is no abdominal tenderness.  Musculoskeletal:     Comments: Trace edema to both feet with rubor  Skin:    General: Skin is warm and dry.  Neurological:     General: No focal deficit present.     Mental Status: She is alert. Mental status is at baseline.  Psychiatric:        Mood and  Affect: Mood normal.     Labs reviewed: Recent Labs    08/07/22 1122 08/07/22 1615 08/07/22 2343 12/22/22 1850 01/24/23 0000 01/25/23 0000  NA 141  --    < > 138 136* 137  K 4.2  --    < > 4.1 4.4 4.3  CL 104  --    < > 103 100 100  CO2 29  --    < > 25 25* 24*  GLUCOSE 99  --   --  108*  --   --   BUN 14  --    < > 11 12 12   CREATININE 0.95  --    < > 0.76 0.7 0.6  CALCIUM 9.2  --    < > 8.6* 8.6* 8.6*  MG  --  2.2  --   --   --   --   PHOS  --  4.0  --   --   --   --    < > = values in this interval not displayed.   Recent Labs    08/07/22 1615 08/16/22 0000 08/16/22 1300 12/22/22 1850 01/24/23 0000  AST 20   < > 16 16 27   ALT 14   < > 14 21 36*  ALKPHOS 69   < > 100 109 167*  BILITOT 0.5  --   --  0.8  --   PROT 6.8  --   --  6.9  --   ALBUMIN 3.6   < > 4.0 3.1* 3.7   < > = values in this interval not displayed.   Recent Labs  08/07/22 1122 08/07/22 2343 08/18/22 0000 12/22/22 1850 01/24/23 0000  WBC 9.9  --  7.2 9.7 9.3  NEUTROABS 7.5  --   --   --  7.10  HGB 14.1   < > 13.5 13.4 13.1  HCT 43.4   < > 41 41.3 38  MCV 98.9  --   --  96.9  --   PLT 301  --  298 350 341   < > = values in this interval not displayed.   Lab Results  Component Value Date   TSH 0.57 01/25/2023   Lab Results  Component Value Date   HGBA1C 5.6 09/21/2017   Lab Results  Component Value Date   CHOL 174 03/23/2022   HDL 66 03/23/2022   LDLCALC 81 03/23/2022   TRIG 134 03/23/2022   CHOLHDL 2.1 09/21/2017    Significant Diagnostic Results in last 30 days:  No results found.  Assessment/Plan  1. Vascular dementia without behavioral disturbance (HCC) Progressive decline in cognition and physical function c/w the disease. Continue supportive care in the skilled environment. Continue namenda Followed by hospice and doing well   2. Chronic bilateral low back pain with bilateral sciatica Pain is controlled with neurontin   3. Hypothyroidism, unspecified  type Continue Synthroid   4. Overactive bladder Continue myrbetriq  5. Tremor Controlled with propranolol   6. Drug-induced constipation Continue miralax   Family/ staff Communication: nurse  Labs/tests ordered:  NA

## 2023-06-17 ENCOUNTER — Encounter: Payer: Self-pay | Admitting: Adult Health

## 2023-06-17 DIAGNOSIS — R634 Abnormal weight loss: Secondary | ICD-10-CM | POA: Diagnosis not present

## 2023-06-17 DIAGNOSIS — I672 Cerebral atherosclerosis: Secondary | ICD-10-CM | POA: Diagnosis not present

## 2023-06-17 DIAGNOSIS — I10 Essential (primary) hypertension: Secondary | ICD-10-CM | POA: Diagnosis not present

## 2023-06-17 DIAGNOSIS — F0154 Vascular dementia, unspecified severity, with anxiety: Secondary | ICD-10-CM | POA: Diagnosis not present

## 2023-06-17 DIAGNOSIS — F0153 Vascular dementia, unspecified severity, with mood disturbance: Secondary | ICD-10-CM | POA: Diagnosis not present

## 2023-06-17 DIAGNOSIS — I69318 Other symptoms and signs involving cognitive functions following cerebral infarction: Secondary | ICD-10-CM | POA: Diagnosis not present

## 2023-06-18 DIAGNOSIS — I69318 Other symptoms and signs involving cognitive functions following cerebral infarction: Secondary | ICD-10-CM | POA: Diagnosis not present

## 2023-06-18 DIAGNOSIS — I672 Cerebral atherosclerosis: Secondary | ICD-10-CM | POA: Diagnosis not present

## 2023-06-18 DIAGNOSIS — F0154 Vascular dementia, unspecified severity, with anxiety: Secondary | ICD-10-CM | POA: Diagnosis not present

## 2023-06-18 DIAGNOSIS — I10 Essential (primary) hypertension: Secondary | ICD-10-CM | POA: Diagnosis not present

## 2023-06-18 DIAGNOSIS — F0153 Vascular dementia, unspecified severity, with mood disturbance: Secondary | ICD-10-CM | POA: Diagnosis not present

## 2023-06-18 DIAGNOSIS — R634 Abnormal weight loss: Secondary | ICD-10-CM | POA: Diagnosis not present

## 2023-06-19 DIAGNOSIS — I672 Cerebral atherosclerosis: Secondary | ICD-10-CM | POA: Diagnosis not present

## 2023-06-19 DIAGNOSIS — F0154 Vascular dementia, unspecified severity, with anxiety: Secondary | ICD-10-CM | POA: Diagnosis not present

## 2023-06-19 DIAGNOSIS — R634 Abnormal weight loss: Secondary | ICD-10-CM | POA: Diagnosis not present

## 2023-06-19 DIAGNOSIS — I10 Essential (primary) hypertension: Secondary | ICD-10-CM | POA: Diagnosis not present

## 2023-06-19 DIAGNOSIS — F0153 Vascular dementia, unspecified severity, with mood disturbance: Secondary | ICD-10-CM | POA: Diagnosis not present

## 2023-06-19 DIAGNOSIS — I69318 Other symptoms and signs involving cognitive functions following cerebral infarction: Secondary | ICD-10-CM | POA: Diagnosis not present

## 2023-06-20 DIAGNOSIS — F0153 Vascular dementia, unspecified severity, with mood disturbance: Secondary | ICD-10-CM | POA: Diagnosis not present

## 2023-06-20 DIAGNOSIS — F0154 Vascular dementia, unspecified severity, with anxiety: Secondary | ICD-10-CM | POA: Diagnosis not present

## 2023-06-20 DIAGNOSIS — I69318 Other symptoms and signs involving cognitive functions following cerebral infarction: Secondary | ICD-10-CM | POA: Diagnosis not present

## 2023-06-20 DIAGNOSIS — R634 Abnormal weight loss: Secondary | ICD-10-CM | POA: Diagnosis not present

## 2023-06-20 DIAGNOSIS — I10 Essential (primary) hypertension: Secondary | ICD-10-CM | POA: Diagnosis not present

## 2023-06-20 DIAGNOSIS — I672 Cerebral atherosclerosis: Secondary | ICD-10-CM | POA: Diagnosis not present

## 2023-06-21 DIAGNOSIS — I10 Essential (primary) hypertension: Secondary | ICD-10-CM | POA: Diagnosis not present

## 2023-06-21 DIAGNOSIS — I69318 Other symptoms and signs involving cognitive functions following cerebral infarction: Secondary | ICD-10-CM | POA: Diagnosis not present

## 2023-06-21 DIAGNOSIS — I672 Cerebral atherosclerosis: Secondary | ICD-10-CM | POA: Diagnosis not present

## 2023-06-21 DIAGNOSIS — F0153 Vascular dementia, unspecified severity, with mood disturbance: Secondary | ICD-10-CM | POA: Diagnosis not present

## 2023-06-21 DIAGNOSIS — R634 Abnormal weight loss: Secondary | ICD-10-CM | POA: Diagnosis not present

## 2023-06-21 DIAGNOSIS — F0154 Vascular dementia, unspecified severity, with anxiety: Secondary | ICD-10-CM | POA: Diagnosis not present

## 2023-06-22 DIAGNOSIS — F0154 Vascular dementia, unspecified severity, with anxiety: Secondary | ICD-10-CM | POA: Diagnosis not present

## 2023-06-22 DIAGNOSIS — I69318 Other symptoms and signs involving cognitive functions following cerebral infarction: Secondary | ICD-10-CM | POA: Diagnosis not present

## 2023-06-22 DIAGNOSIS — F0153 Vascular dementia, unspecified severity, with mood disturbance: Secondary | ICD-10-CM | POA: Diagnosis not present

## 2023-06-22 DIAGNOSIS — R634 Abnormal weight loss: Secondary | ICD-10-CM | POA: Diagnosis not present

## 2023-06-22 DIAGNOSIS — I672 Cerebral atherosclerosis: Secondary | ICD-10-CM | POA: Diagnosis not present

## 2023-06-22 DIAGNOSIS — I10 Essential (primary) hypertension: Secondary | ICD-10-CM | POA: Diagnosis not present

## 2023-06-23 DIAGNOSIS — I10 Essential (primary) hypertension: Secondary | ICD-10-CM | POA: Diagnosis not present

## 2023-06-23 DIAGNOSIS — I672 Cerebral atherosclerosis: Secondary | ICD-10-CM | POA: Diagnosis not present

## 2023-06-23 DIAGNOSIS — I69318 Other symptoms and signs involving cognitive functions following cerebral infarction: Secondary | ICD-10-CM | POA: Diagnosis not present

## 2023-06-23 DIAGNOSIS — F0153 Vascular dementia, unspecified severity, with mood disturbance: Secondary | ICD-10-CM | POA: Diagnosis not present

## 2023-06-23 DIAGNOSIS — F0154 Vascular dementia, unspecified severity, with anxiety: Secondary | ICD-10-CM | POA: Diagnosis not present

## 2023-06-23 DIAGNOSIS — R634 Abnormal weight loss: Secondary | ICD-10-CM | POA: Diagnosis not present

## 2023-06-24 DIAGNOSIS — I69318 Other symptoms and signs involving cognitive functions following cerebral infarction: Secondary | ICD-10-CM | POA: Diagnosis not present

## 2023-06-24 DIAGNOSIS — I10 Essential (primary) hypertension: Secondary | ICD-10-CM | POA: Diagnosis not present

## 2023-06-24 DIAGNOSIS — F0154 Vascular dementia, unspecified severity, with anxiety: Secondary | ICD-10-CM | POA: Diagnosis not present

## 2023-06-24 DIAGNOSIS — R634 Abnormal weight loss: Secondary | ICD-10-CM | POA: Diagnosis not present

## 2023-06-24 DIAGNOSIS — I672 Cerebral atherosclerosis: Secondary | ICD-10-CM | POA: Diagnosis not present

## 2023-06-24 DIAGNOSIS — F0153 Vascular dementia, unspecified severity, with mood disturbance: Secondary | ICD-10-CM | POA: Diagnosis not present

## 2023-06-25 DIAGNOSIS — F0154 Vascular dementia, unspecified severity, with anxiety: Secondary | ICD-10-CM | POA: Diagnosis not present

## 2023-06-25 DIAGNOSIS — R634 Abnormal weight loss: Secondary | ICD-10-CM | POA: Diagnosis not present

## 2023-06-25 DIAGNOSIS — I672 Cerebral atherosclerosis: Secondary | ICD-10-CM | POA: Diagnosis not present

## 2023-06-25 DIAGNOSIS — I69318 Other symptoms and signs involving cognitive functions following cerebral infarction: Secondary | ICD-10-CM | POA: Diagnosis not present

## 2023-06-25 DIAGNOSIS — I10 Essential (primary) hypertension: Secondary | ICD-10-CM | POA: Diagnosis not present

## 2023-06-25 DIAGNOSIS — F0153 Vascular dementia, unspecified severity, with mood disturbance: Secondary | ICD-10-CM | POA: Diagnosis not present

## 2023-06-26 DIAGNOSIS — R634 Abnormal weight loss: Secondary | ICD-10-CM | POA: Diagnosis not present

## 2023-06-26 DIAGNOSIS — F0154 Vascular dementia, unspecified severity, with anxiety: Secondary | ICD-10-CM | POA: Diagnosis not present

## 2023-06-26 DIAGNOSIS — I672 Cerebral atherosclerosis: Secondary | ICD-10-CM | POA: Diagnosis not present

## 2023-06-26 DIAGNOSIS — F0153 Vascular dementia, unspecified severity, with mood disturbance: Secondary | ICD-10-CM | POA: Diagnosis not present

## 2023-06-26 DIAGNOSIS — I10 Essential (primary) hypertension: Secondary | ICD-10-CM | POA: Diagnosis not present

## 2023-06-26 DIAGNOSIS — I69318 Other symptoms and signs involving cognitive functions following cerebral infarction: Secondary | ICD-10-CM | POA: Diagnosis not present

## 2023-06-27 DIAGNOSIS — I10 Essential (primary) hypertension: Secondary | ICD-10-CM | POA: Diagnosis not present

## 2023-06-27 DIAGNOSIS — I69318 Other symptoms and signs involving cognitive functions following cerebral infarction: Secondary | ICD-10-CM | POA: Diagnosis not present

## 2023-06-27 DIAGNOSIS — I672 Cerebral atherosclerosis: Secondary | ICD-10-CM | POA: Diagnosis not present

## 2023-06-27 DIAGNOSIS — R634 Abnormal weight loss: Secondary | ICD-10-CM | POA: Diagnosis not present

## 2023-06-27 DIAGNOSIS — F0154 Vascular dementia, unspecified severity, with anxiety: Secondary | ICD-10-CM | POA: Diagnosis not present

## 2023-06-27 DIAGNOSIS — F0153 Vascular dementia, unspecified severity, with mood disturbance: Secondary | ICD-10-CM | POA: Diagnosis not present

## 2023-06-28 DIAGNOSIS — R634 Abnormal weight loss: Secondary | ICD-10-CM | POA: Diagnosis not present

## 2023-06-28 DIAGNOSIS — I69318 Other symptoms and signs involving cognitive functions following cerebral infarction: Secondary | ICD-10-CM | POA: Diagnosis not present

## 2023-06-28 DIAGNOSIS — I672 Cerebral atherosclerosis: Secondary | ICD-10-CM | POA: Diagnosis not present

## 2023-06-28 DIAGNOSIS — I10 Essential (primary) hypertension: Secondary | ICD-10-CM | POA: Diagnosis not present

## 2023-06-28 DIAGNOSIS — Z9181 History of falling: Secondary | ICD-10-CM | POA: Diagnosis not present

## 2023-06-28 DIAGNOSIS — E785 Hyperlipidemia, unspecified: Secondary | ICD-10-CM | POA: Diagnosis not present

## 2023-06-28 DIAGNOSIS — F0154 Vascular dementia, unspecified severity, with anxiety: Secondary | ICD-10-CM | POA: Diagnosis not present

## 2023-06-28 DIAGNOSIS — S2243XD Multiple fractures of ribs, bilateral, subsequent encounter for fracture with routine healing: Secondary | ICD-10-CM | POA: Diagnosis not present

## 2023-06-28 DIAGNOSIS — M48061 Spinal stenosis, lumbar region without neurogenic claudication: Secondary | ICD-10-CM | POA: Diagnosis not present

## 2023-06-28 DIAGNOSIS — K219 Gastro-esophageal reflux disease without esophagitis: Secondary | ICD-10-CM | POA: Diagnosis not present

## 2023-06-28 DIAGNOSIS — F0153 Vascular dementia, unspecified severity, with mood disturbance: Secondary | ICD-10-CM | POA: Diagnosis not present

## 2023-06-28 DIAGNOSIS — E039 Hypothyroidism, unspecified: Secondary | ICD-10-CM | POA: Diagnosis not present

## 2023-06-29 DIAGNOSIS — I10 Essential (primary) hypertension: Secondary | ICD-10-CM | POA: Diagnosis not present

## 2023-06-29 DIAGNOSIS — I69318 Other symptoms and signs involving cognitive functions following cerebral infarction: Secondary | ICD-10-CM | POA: Diagnosis not present

## 2023-06-29 DIAGNOSIS — F0153 Vascular dementia, unspecified severity, with mood disturbance: Secondary | ICD-10-CM | POA: Diagnosis not present

## 2023-06-29 DIAGNOSIS — F0154 Vascular dementia, unspecified severity, with anxiety: Secondary | ICD-10-CM | POA: Diagnosis not present

## 2023-06-29 DIAGNOSIS — I672 Cerebral atherosclerosis: Secondary | ICD-10-CM | POA: Diagnosis not present

## 2023-06-29 DIAGNOSIS — R634 Abnormal weight loss: Secondary | ICD-10-CM | POA: Diagnosis not present

## 2023-06-30 DIAGNOSIS — F0153 Vascular dementia, unspecified severity, with mood disturbance: Secondary | ICD-10-CM | POA: Diagnosis not present

## 2023-06-30 DIAGNOSIS — I69318 Other symptoms and signs involving cognitive functions following cerebral infarction: Secondary | ICD-10-CM | POA: Diagnosis not present

## 2023-06-30 DIAGNOSIS — R634 Abnormal weight loss: Secondary | ICD-10-CM | POA: Diagnosis not present

## 2023-06-30 DIAGNOSIS — I10 Essential (primary) hypertension: Secondary | ICD-10-CM | POA: Diagnosis not present

## 2023-06-30 DIAGNOSIS — I672 Cerebral atherosclerosis: Secondary | ICD-10-CM | POA: Diagnosis not present

## 2023-06-30 DIAGNOSIS — F0154 Vascular dementia, unspecified severity, with anxiety: Secondary | ICD-10-CM | POA: Diagnosis not present

## 2023-07-01 DIAGNOSIS — R634 Abnormal weight loss: Secondary | ICD-10-CM | POA: Diagnosis not present

## 2023-07-01 DIAGNOSIS — F0153 Vascular dementia, unspecified severity, with mood disturbance: Secondary | ICD-10-CM | POA: Diagnosis not present

## 2023-07-01 DIAGNOSIS — F0154 Vascular dementia, unspecified severity, with anxiety: Secondary | ICD-10-CM | POA: Diagnosis not present

## 2023-07-01 DIAGNOSIS — I10 Essential (primary) hypertension: Secondary | ICD-10-CM | POA: Diagnosis not present

## 2023-07-01 DIAGNOSIS — I69318 Other symptoms and signs involving cognitive functions following cerebral infarction: Secondary | ICD-10-CM | POA: Diagnosis not present

## 2023-07-01 DIAGNOSIS — I672 Cerebral atherosclerosis: Secondary | ICD-10-CM | POA: Diagnosis not present

## 2023-07-02 DIAGNOSIS — I672 Cerebral atherosclerosis: Secondary | ICD-10-CM | POA: Diagnosis not present

## 2023-07-02 DIAGNOSIS — I69318 Other symptoms and signs involving cognitive functions following cerebral infarction: Secondary | ICD-10-CM | POA: Diagnosis not present

## 2023-07-02 DIAGNOSIS — F0153 Vascular dementia, unspecified severity, with mood disturbance: Secondary | ICD-10-CM | POA: Diagnosis not present

## 2023-07-02 DIAGNOSIS — R634 Abnormal weight loss: Secondary | ICD-10-CM | POA: Diagnosis not present

## 2023-07-02 DIAGNOSIS — I10 Essential (primary) hypertension: Secondary | ICD-10-CM | POA: Diagnosis not present

## 2023-07-02 DIAGNOSIS — F0154 Vascular dementia, unspecified severity, with anxiety: Secondary | ICD-10-CM | POA: Diagnosis not present

## 2023-07-03 DIAGNOSIS — I10 Essential (primary) hypertension: Secondary | ICD-10-CM | POA: Diagnosis not present

## 2023-07-03 DIAGNOSIS — I672 Cerebral atherosclerosis: Secondary | ICD-10-CM | POA: Diagnosis not present

## 2023-07-03 DIAGNOSIS — F0153 Vascular dementia, unspecified severity, with mood disturbance: Secondary | ICD-10-CM | POA: Diagnosis not present

## 2023-07-03 DIAGNOSIS — R634 Abnormal weight loss: Secondary | ICD-10-CM | POA: Diagnosis not present

## 2023-07-03 DIAGNOSIS — I69318 Other symptoms and signs involving cognitive functions following cerebral infarction: Secondary | ICD-10-CM | POA: Diagnosis not present

## 2023-07-03 DIAGNOSIS — F0154 Vascular dementia, unspecified severity, with anxiety: Secondary | ICD-10-CM | POA: Diagnosis not present

## 2023-07-04 DIAGNOSIS — R634 Abnormal weight loss: Secondary | ICD-10-CM | POA: Diagnosis not present

## 2023-07-04 DIAGNOSIS — I10 Essential (primary) hypertension: Secondary | ICD-10-CM | POA: Diagnosis not present

## 2023-07-04 DIAGNOSIS — F0153 Vascular dementia, unspecified severity, with mood disturbance: Secondary | ICD-10-CM | POA: Diagnosis not present

## 2023-07-04 DIAGNOSIS — I672 Cerebral atherosclerosis: Secondary | ICD-10-CM | POA: Diagnosis not present

## 2023-07-04 DIAGNOSIS — F0154 Vascular dementia, unspecified severity, with anxiety: Secondary | ICD-10-CM | POA: Diagnosis not present

## 2023-07-04 DIAGNOSIS — I69318 Other symptoms and signs involving cognitive functions following cerebral infarction: Secondary | ICD-10-CM | POA: Diagnosis not present

## 2023-07-05 DIAGNOSIS — I672 Cerebral atherosclerosis: Secondary | ICD-10-CM | POA: Diagnosis not present

## 2023-07-05 DIAGNOSIS — I10 Essential (primary) hypertension: Secondary | ICD-10-CM | POA: Diagnosis not present

## 2023-07-05 DIAGNOSIS — R634 Abnormal weight loss: Secondary | ICD-10-CM | POA: Diagnosis not present

## 2023-07-05 DIAGNOSIS — F0153 Vascular dementia, unspecified severity, with mood disturbance: Secondary | ICD-10-CM | POA: Diagnosis not present

## 2023-07-05 DIAGNOSIS — F0154 Vascular dementia, unspecified severity, with anxiety: Secondary | ICD-10-CM | POA: Diagnosis not present

## 2023-07-05 DIAGNOSIS — I69318 Other symptoms and signs involving cognitive functions following cerebral infarction: Secondary | ICD-10-CM | POA: Diagnosis not present

## 2023-07-06 DIAGNOSIS — F0154 Vascular dementia, unspecified severity, with anxiety: Secondary | ICD-10-CM | POA: Diagnosis not present

## 2023-07-06 DIAGNOSIS — I10 Essential (primary) hypertension: Secondary | ICD-10-CM | POA: Diagnosis not present

## 2023-07-06 DIAGNOSIS — I69318 Other symptoms and signs involving cognitive functions following cerebral infarction: Secondary | ICD-10-CM | POA: Diagnosis not present

## 2023-07-06 DIAGNOSIS — F0153 Vascular dementia, unspecified severity, with mood disturbance: Secondary | ICD-10-CM | POA: Diagnosis not present

## 2023-07-06 DIAGNOSIS — I672 Cerebral atherosclerosis: Secondary | ICD-10-CM | POA: Diagnosis not present

## 2023-07-06 DIAGNOSIS — R634 Abnormal weight loss: Secondary | ICD-10-CM | POA: Diagnosis not present

## 2023-07-07 DIAGNOSIS — I69318 Other symptoms and signs involving cognitive functions following cerebral infarction: Secondary | ICD-10-CM | POA: Diagnosis not present

## 2023-07-07 DIAGNOSIS — F0153 Vascular dementia, unspecified severity, with mood disturbance: Secondary | ICD-10-CM | POA: Diagnosis not present

## 2023-07-07 DIAGNOSIS — I10 Essential (primary) hypertension: Secondary | ICD-10-CM | POA: Diagnosis not present

## 2023-07-07 DIAGNOSIS — I672 Cerebral atherosclerosis: Secondary | ICD-10-CM | POA: Diagnosis not present

## 2023-07-07 DIAGNOSIS — R634 Abnormal weight loss: Secondary | ICD-10-CM | POA: Diagnosis not present

## 2023-07-07 DIAGNOSIS — F0154 Vascular dementia, unspecified severity, with anxiety: Secondary | ICD-10-CM | POA: Diagnosis not present

## 2023-07-08 DIAGNOSIS — I69318 Other symptoms and signs involving cognitive functions following cerebral infarction: Secondary | ICD-10-CM | POA: Diagnosis not present

## 2023-07-08 DIAGNOSIS — F0154 Vascular dementia, unspecified severity, with anxiety: Secondary | ICD-10-CM | POA: Diagnosis not present

## 2023-07-08 DIAGNOSIS — I10 Essential (primary) hypertension: Secondary | ICD-10-CM | POA: Diagnosis not present

## 2023-07-08 DIAGNOSIS — I672 Cerebral atherosclerosis: Secondary | ICD-10-CM | POA: Diagnosis not present

## 2023-07-08 DIAGNOSIS — R634 Abnormal weight loss: Secondary | ICD-10-CM | POA: Diagnosis not present

## 2023-07-08 DIAGNOSIS — F0153 Vascular dementia, unspecified severity, with mood disturbance: Secondary | ICD-10-CM | POA: Diagnosis not present

## 2023-07-09 DIAGNOSIS — R634 Abnormal weight loss: Secondary | ICD-10-CM | POA: Diagnosis not present

## 2023-07-09 DIAGNOSIS — I10 Essential (primary) hypertension: Secondary | ICD-10-CM | POA: Diagnosis not present

## 2023-07-09 DIAGNOSIS — F0153 Vascular dementia, unspecified severity, with mood disturbance: Secondary | ICD-10-CM | POA: Diagnosis not present

## 2023-07-09 DIAGNOSIS — I672 Cerebral atherosclerosis: Secondary | ICD-10-CM | POA: Diagnosis not present

## 2023-07-09 DIAGNOSIS — I69318 Other symptoms and signs involving cognitive functions following cerebral infarction: Secondary | ICD-10-CM | POA: Diagnosis not present

## 2023-07-09 DIAGNOSIS — F0154 Vascular dementia, unspecified severity, with anxiety: Secondary | ICD-10-CM | POA: Diagnosis not present

## 2023-07-10 DIAGNOSIS — I10 Essential (primary) hypertension: Secondary | ICD-10-CM | POA: Diagnosis not present

## 2023-07-10 DIAGNOSIS — I69318 Other symptoms and signs involving cognitive functions following cerebral infarction: Secondary | ICD-10-CM | POA: Diagnosis not present

## 2023-07-10 DIAGNOSIS — F0154 Vascular dementia, unspecified severity, with anxiety: Secondary | ICD-10-CM | POA: Diagnosis not present

## 2023-07-10 DIAGNOSIS — R634 Abnormal weight loss: Secondary | ICD-10-CM | POA: Diagnosis not present

## 2023-07-10 DIAGNOSIS — I672 Cerebral atherosclerosis: Secondary | ICD-10-CM | POA: Diagnosis not present

## 2023-07-10 DIAGNOSIS — F0153 Vascular dementia, unspecified severity, with mood disturbance: Secondary | ICD-10-CM | POA: Diagnosis not present

## 2023-07-11 DIAGNOSIS — I672 Cerebral atherosclerosis: Secondary | ICD-10-CM | POA: Diagnosis not present

## 2023-07-11 DIAGNOSIS — R634 Abnormal weight loss: Secondary | ICD-10-CM | POA: Diagnosis not present

## 2023-07-11 DIAGNOSIS — F0154 Vascular dementia, unspecified severity, with anxiety: Secondary | ICD-10-CM | POA: Diagnosis not present

## 2023-07-11 DIAGNOSIS — F0153 Vascular dementia, unspecified severity, with mood disturbance: Secondary | ICD-10-CM | POA: Diagnosis not present

## 2023-07-11 DIAGNOSIS — I69318 Other symptoms and signs involving cognitive functions following cerebral infarction: Secondary | ICD-10-CM | POA: Diagnosis not present

## 2023-07-11 DIAGNOSIS — I10 Essential (primary) hypertension: Secondary | ICD-10-CM | POA: Diagnosis not present

## 2023-07-12 DIAGNOSIS — I672 Cerebral atherosclerosis: Secondary | ICD-10-CM | POA: Diagnosis not present

## 2023-07-12 DIAGNOSIS — F0153 Vascular dementia, unspecified severity, with mood disturbance: Secondary | ICD-10-CM | POA: Diagnosis not present

## 2023-07-12 DIAGNOSIS — R634 Abnormal weight loss: Secondary | ICD-10-CM | POA: Diagnosis not present

## 2023-07-12 DIAGNOSIS — I10 Essential (primary) hypertension: Secondary | ICD-10-CM | POA: Diagnosis not present

## 2023-07-12 DIAGNOSIS — I69318 Other symptoms and signs involving cognitive functions following cerebral infarction: Secondary | ICD-10-CM | POA: Diagnosis not present

## 2023-07-12 DIAGNOSIS — F0154 Vascular dementia, unspecified severity, with anxiety: Secondary | ICD-10-CM | POA: Diagnosis not present

## 2023-07-13 DIAGNOSIS — I10 Essential (primary) hypertension: Secondary | ICD-10-CM | POA: Diagnosis not present

## 2023-07-13 DIAGNOSIS — R634 Abnormal weight loss: Secondary | ICD-10-CM | POA: Diagnosis not present

## 2023-07-13 DIAGNOSIS — I672 Cerebral atherosclerosis: Secondary | ICD-10-CM | POA: Diagnosis not present

## 2023-07-13 DIAGNOSIS — I69318 Other symptoms and signs involving cognitive functions following cerebral infarction: Secondary | ICD-10-CM | POA: Diagnosis not present

## 2023-07-13 DIAGNOSIS — F0154 Vascular dementia, unspecified severity, with anxiety: Secondary | ICD-10-CM | POA: Diagnosis not present

## 2023-07-13 DIAGNOSIS — F0153 Vascular dementia, unspecified severity, with mood disturbance: Secondary | ICD-10-CM | POA: Diagnosis not present

## 2023-07-14 ENCOUNTER — Non-Acute Institutional Stay (SKILLED_NURSING_FACILITY): Payer: Medicare Other | Admitting: Adult Health

## 2023-07-14 ENCOUNTER — Encounter: Payer: Self-pay | Admitting: Adult Health

## 2023-07-14 DIAGNOSIS — F0150 Vascular dementia without behavioral disturbance: Secondary | ICD-10-CM

## 2023-07-14 DIAGNOSIS — R251 Tremor, unspecified: Secondary | ICD-10-CM

## 2023-07-14 DIAGNOSIS — N3281 Overactive bladder: Secondary | ICD-10-CM | POA: Diagnosis not present

## 2023-07-14 DIAGNOSIS — E039 Hypothyroidism, unspecified: Secondary | ICD-10-CM

## 2023-07-14 DIAGNOSIS — F0154 Vascular dementia, unspecified severity, with anxiety: Secondary | ICD-10-CM | POA: Diagnosis not present

## 2023-07-14 DIAGNOSIS — R5383 Other fatigue: Secondary | ICD-10-CM | POA: Diagnosis not present

## 2023-07-14 DIAGNOSIS — M48062 Spinal stenosis, lumbar region with neurogenic claudication: Secondary | ICD-10-CM

## 2023-07-14 DIAGNOSIS — I10 Essential (primary) hypertension: Secondary | ICD-10-CM | POA: Diagnosis not present

## 2023-07-14 DIAGNOSIS — R634 Abnormal weight loss: Secondary | ICD-10-CM | POA: Diagnosis not present

## 2023-07-14 DIAGNOSIS — F0153 Vascular dementia, unspecified severity, with mood disturbance: Secondary | ICD-10-CM | POA: Diagnosis not present

## 2023-07-14 DIAGNOSIS — I672 Cerebral atherosclerosis: Secondary | ICD-10-CM | POA: Diagnosis not present

## 2023-07-14 DIAGNOSIS — I69318 Other symptoms and signs involving cognitive functions following cerebral infarction: Secondary | ICD-10-CM | POA: Diagnosis not present

## 2023-07-14 NOTE — Progress Notes (Signed)
Location:  [facility] Room Number: 146A Place of Service:  SNF [phone]) Provider:  Tamsen Roers, MD  Patient Care Team: Mahlon Gammon, MD as PCP - General (Internal Medicine) Nahser, Deloris Ping, MD as PCP - Cardiology (Cardiology) Vida Rigger, MD as Consulting Physician (Gastroenterology) Levert Feinstein, MD as Consulting Physician (Neurology) Barnett Abu, MD as Consulting Physician (Neurosurgery) [place], Amy, MD as Consulting Physician (Dermatology) Jerene Bears, MD as Consulting Physician (Gynecology) [place], Heather, OD (Optometry) Mahlon Gammon, MD (Internal Medicine)  Extended Emergency Contact Information Primary Emergency Contact: Romey,Robert J Address: [address]          [place], [place] [address] [place] Home Phone: [phone] Work Phone: [phone] Mobile Phone: [phone] Relation: Spouse Secondary Emergency Contact: [place] Home Phone: [phone] Mobile Phone: [phone] Relation: Son  Code Status:  DNR hospice Goals of care: Advanced Directive information    07/14/2023   11:17 AM  Advanced Directives  Does Patient Have a Medical Advance Directive? Yes  Type of Estate agent of [place];Out of facility DNR (pink MOST or yellow form);Living will  Does patient want to make changes to medical advance directive? No - Patient declined  Copy of Healthcare Power of Attorney in Chart? Yes - validated most recent copy scanned in chart (See row information)  Pre-existing out of facility DNR order (yellow form or pink MOST form) Pink MOST form placed in chart (order not valid for inpatient use);Yellow form placed in chart (order not valid for inpatient use)     Chief Complaint  Patient presents with   Medical Management of Chronic Issues    HPI:  Pt is a 82 y.o. female seen today for medical management of chronic diseases.     Enrolled in hospice care due to progressive dementia and chronic pain related issues. .Needs assistance with all ADLs. Able to communicate needs but needs cuing and prompting.   Vascular dementia: Hx of TIA on plavix.  Off aricept due to weight loss. No aggression, very pleasant. Has severe anxiety with a long term hx of this. Had some issues with tremors and lethargy, no longer on wellbutrin and clonazepam. No reported mood related issues. MMSE 15/30 02/10/23.    The nurse reports she is sleeping more and more confused lately.    Has chronic back and hip pain. March 2024 MRI of hips Tear of the left gluteus minimus tendon High grade parital near complete tear of the right gluteus minimus tendon insertion  Also has a hx of sciatica with prior surgery and injections.  Currently on neurontin TID. No reports of pain.  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 Street of back injections by Dr. Murray Hodgkins 2016 with good relief, history of cervical DD with possiblity of surgery by Dr. Dutch Quint.   GERD (gastroesophageal reflux disease)    IBS with moderate refluc seen on upper GI study  form January 2011 Dr. Ewing Schlein   HTN (hypertension)    no meds   Hyperlipidemia    Hypothyroidism    IBS (irritable bowel syndrome)    Imbalance    Impingement syndrome of right shoulder 01/05/2013   Mass of left side of neck    Memory loss    Mood disorder (HCC)    MVP (mitral valve prolapse)    Hx of    Osteopenia    Peripheral edema    Right  rotator cuff tear 01/05/2013   Sciatic pain    recurrent   Seasonal allergies    Stroke [facility])    Tension headache    TIA (transient ischemic attack)    Tremor    Past Surgical History:  Procedure Laterality Date   ANTERIOR CERVICAL DECOMP/DISCECTOMY FUSION  04/2015    Dr. Rise Mu, [facility]   CARDIAC CATHETERIZATION  2004   which revealed smooth and normal coronary arteries   CARPAL TUNNEL RELEASE  2000   lt   CATARACT EXTRACTION W/ INTRAOCULAR LENS  IMPLANT, BILATERAL  1/17   Dr. Delaney Meigs   COSMETIC SURGERY  2010   eyes-facial   DENTAL SURGERY  1960   LASIK     LOOP RECORDER INSERTION N/A 09/22/2017   Procedure: LOOP RECORDER INSERTION;  Surgeon: Regan Lemming, MD;  Location: [facility] INVASIVE CV LAB;  Service: Cardiovascular;  Laterality: N/A;   LUMBAR LAMINECTOMY/DECOMPRESSION MICRODISCECTOMY N/A 06/08/2021   Procedure: Laminectomy and Foraminotomy - L4-L5, L5-S1;  Surgeon: Julio Sicks, MD;  Location: [facility] OR;  Service: Neurosurgery;  Laterality: N/A;   SHOULDER ARTHROSCOPY WITH ROTATOR CUFF REPAIR Right 01/05/2013   Procedure: SHOULDER ARTHROSCOPY WITH ARTHROSCOPIC  ROTATOR CUFF REPAIR, ACROMIOPLASTY, EXTENSIVE DEBRIDEMENT;  Surgeon: Eulas Post, MD;  Location: [facility];  Service: Orthopedics;  Laterality: Right;   TEE WITHOUT CARDIOVERSION N/A 09/22/2017   Procedure: TRANSESOPHAGEAL ECHOCARDIOGRAM (TEE);  Surgeon: Thurmon Fair, MD;  Location: [facility] ENDOSCOPY;  Service: Cardiovascular;  Laterality: N/A;   TONSILLECTOMY     TUBAL LIGATION      Allergies  Allergen Reactions   Fluticasone Other (See Comments)    DRY EYES   Aciphex [Rabeprazole] Other (See Comments)    Critical    Codeine Nausea Only   Codeine Other (See Comments)    unknown   Cymbalta [Duloxetine Hcl] Other (See Comments)    Critical    Lexapro [Escitalopram] Other (See Comments)    critical   Lipitor [Atorvastatin Calcium] Other (See Comments)    Intolerant    Morphine Sulfate      Per matrix   Pneumovax [Pneumococcal Polysaccharide Vaccine] Other (See Comments)    moderate   Prevacid [Lansoprazole] Other (See Comments)    Unknown, listed on MAR   Rosuvastatin Other (See Comments)    Intolerant    Sertraline Other (See Comments)    critical   Statins Other (See Comments)    Critical    Sulfa Antibiotics Nausea And Vomiting   Tetracyclines & Related Other (See Comments)    Morphine Sulfate   Welchol [Colesevelam] Other (See Comments)    Unknown, listed on MAR   Pneumovax 23 [Pneumococcal Vac Polyvalent] Rash    Injection site reaction    Outpatient Encounter Medications as of 07/14/2023  Medication Sig   acetaminophen (TYLENOL) 325 MG tablet Take 650 mg by mouth every 6 (six) hours as needed.   clopidogrel (PLAVIX) 75 MG tablet Take 1 tablet (75 mg total) by mouth daily.   diclofenac  Sodium (VOLTAREN) 1 % GEL Apply 2 g topically 2 (two) times daily as needed (pain).   gabapentin (NEURONTIN) 100 MG capsule Take 100 mg by mouth 3 (three) times daily.   levothyroxine (SYNTHROID) 88 MCG tablet Take 88 mcg by mouth daily before breakfast.   memantine (NAMENDA) 5 MG tablet Take 5 mg by mouth 2 (two) times daily.   mirabegron ER (MYRBETRIQ) 25 MG TB24 tablet Take 1 tablet (25 mg total) by mouth daily.   omeprazole (PRILOSEC) 40 MG capsule Take 40 mg by mouth daily.   polyethylene glycol (MIRALAX) 17 g packet Take 17 g by mouth daily.   propranolol (INDERAL) 20 MG tablet Take 20 mg by mouth 2 (two) times daily. 1 tablet (20 mg) oral, Twice A Day   No facility-administered encounter medications on file as of 07/14/2023.    Review of Systems  Constitutional:  Positive for fatigue. Negative for activity change, appetite change, chills, diaphoresis, fever and unexpected weight change.  HENT:  Negative for congestion.   Respiratory:  Negative for cough, shortness of breath and wheezing.   Cardiovascular:  Positive for leg swelling. Negative for chest pain and  palpitations.  Gastrointestinal:  Negative for abdominal distention, abdominal pain, constipation and diarrhea.  Genitourinary:  Negative for difficulty urinating and dysuria.  Musculoskeletal:  Positive for gait problem. Negative for arthralgias, back pain, joint swelling and myalgias.  Neurological:  Negative for dizziness, tremors, seizures, syncope, facial asymmetry, speech difficulty, weakness, light-headedness, numbness and headaches.  Psychiatric/Behavioral:  Positive for confusion. Negative for agitation and behavioral problems.     Immunization History  Administered Date(s) Administered   Fluad Quad(high Dose 65+) 07/02/2022   Influenza, High Dose Seasonal PF 07/23/2017, 07/03/2019   Influenza-Unspecified 05/27/2010, 08/03/2010, 07/22/2011, 07/01/2012, 06/22/2013, 07/18/2020, 07/11/2021, 07/02/2022   Janssen (J&J) SARS-COV-2 Vaccination 11/06/2019   Moderna SARS-COV2 Booster Vaccination 08/05/2021   Moderna Sars-Covid-2 Vaccination 08/07/2020   PFIZER(Purple Top)SARS-COV-2 Vaccination 10/09/2019, 08/07/2020   Pfizer Covid-19 Vaccine Bivalent Booster 19yrs & up 01/21/2023   Pneumococcal Conjugate-13 02/08/2015   Pneumococcal Polysaccharide-23 05/28/2006, 10/13/2011   Pneumococcal-Unspecified 10/17/2013   Td 03/29/2002   Tdap 09/27/2001, 04/25/2002, 08/18/2012, 10/17/2013   Zoster Recombinant(Shingrix) 05/23/2021, 07/23/2021   Zoster, Live 08/30/2006   Zoster, Unspecified 08/30/2006   Pertinent  Health Maintenance Due  Topic Date Due   INFLUENZA VACCINE  04/28/2023   DEXA SCAN  Completed      08/07/2022   11:17 AM 08/07/2022   11:03 PM 08/08/2022    8:00 PM 08/09/2022    8:00 AM 08/17/2022   10:59 AM  Fall Risk  Falls in the past year?     1  Was there an injury with Fall?     1  Fall Risk Category Calculator     3  Fall Risk Category (Retired)     High  (RETIRED) Patient Fall Risk Level High fall risk High fall risk High fall risk High fall risk High fall risk   Patient at Risk for Falls Due to     History of fall(s);Impaired balance/gait;Impaired mobility  Fall risk Follow up     Falls evaluation completed   Functional Status Survey:    Vitals:   07/14/23 1112  BP: 110/62  Pulse: 67  Resp: 17  Temp: (!) 97.3 F (36.3 C)  TempSrc: Temporal  SpO2: 97%  Weight: 146 lb 8 oz (66.5 kg)  Height: 5\' 1"  (1.549 m)   Body mass index is 27.68 kg/m. Physical Exam Vitals and nursing note  reviewed.  Constitutional:      General: She is not in acute distress.    Appearance: She is not diaphoretic.  HENT:     Head: Normocephalic and atraumatic.  Neck:     Vascular: No JVD.  Cardiovascular:     Rate and Rhythm: Normal rate and regular rhythm.     Heart sounds: No murmur heard. Pulmonary:     Effort: Pulmonary effort is normal. No respiratory distress.     Breath sounds: Normal breath sounds. No wheezing.  Musculoskeletal:     Comments: Trace edema to both ankles.  Rubor note.d   Skin:    General: Skin is warm and dry.  Neurological:     Mental Status: She is alert and oriented to person, place, and time.     Labs reviewed: Recent Labs    08/07/22 1122 08/07/22 1615 08/07/22 2343 12/22/22 1850 01/24/23 0000 01/25/23 0000  NA 141  --    < > 138 136* 137  K 4.2  --    < > 4.1 4.4 4.3  CL 104  --    < > 103 100 100  CO2 29  --    < > 25 25* 24*  GLUCOSE 99  --   --  108*  --   --   BUN 14  --    < > 11 12 12   CREATININE 0.95  --    < > 0.76 0.7 0.6  CALCIUM 9.2  --    < > 8.6* 8.6* 8.6*  MG  --  2.2  --   --   --   --   PHOS  --  4.0  --   --   --   --    < > = values in this interval not displayed.   Recent Labs    08/07/22 1615 08/16/22 0000 08/16/22 1300 12/22/22 1850 01/24/23 0000  AST 20   < > 16 16 27   ALT 14   < > 14 21 36*  ALKPHOS 69   < > 100 109 167*  BILITOT 0.5  --   --  0.8  --   PROT 6.8  --   --  6.9  --   ALBUMIN 3.6   < > 4.0 3.1* 3.7   < > = values in this interval not displayed.   Recent Labs     08/07/22 1122 08/07/22 2343 08/18/22 0000 12/22/22 1850 01/24/23 0000  WBC 9.9  --  7.2 9.7 9.3  NEUTROABS 7.5  --   --   --  7.10  HGB 14.1   < > 13.5 13.4 13.1  HCT 43.4   < > 41 41.3 38  MCV 98.9  --   --  96.9  --   PLT 301  --  298 350 341   < > = values in this interval not displayed.   Lab Results  Component Value Date   TSH 0.57 01/25/2023   Lab Results  Component Value Date   HGBA1C 5.6 09/21/2017   Lab Results  Component Value Date   CHOL 174 03/23/2022   HDL 66 03/23/2022   LDLCALC 81 03/23/2022   TRIG 134 03/23/2022   CHOLHDL 2.1 09/21/2017    Significant Diagnostic Results in last 30 days:  No results found.  Assessment/Plan  1. Lethargy Mild May be due to dementia progressing Her pain has been well controlled, will reduce neurontin and see if this helps.   2.  Hypothyroidism, unspecified type Continue levothyroxine Check TSH in Nov due to manufacturer change.   3. Vascular dementia without behavioral disturbance (HCC) Progressive decline in cognition and physical function c/w the disease. Continue supportive care in the skilled environment. Followed by hospice.   4. Overactive bladder Continue myrbetriq  5. Tremor Continue propranolol   6. Lumbar stenosis with neurogenic claudication See above.      Labs/tests ordered:  TSH nov

## 2023-07-15 DIAGNOSIS — I672 Cerebral atherosclerosis: Secondary | ICD-10-CM | POA: Diagnosis not present

## 2023-07-15 DIAGNOSIS — I10 Essential (primary) hypertension: Secondary | ICD-10-CM | POA: Diagnosis not present

## 2023-07-15 DIAGNOSIS — I69318 Other symptoms and signs involving cognitive functions following cerebral infarction: Secondary | ICD-10-CM | POA: Diagnosis not present

## 2023-07-15 DIAGNOSIS — F0154 Vascular dementia, unspecified severity, with anxiety: Secondary | ICD-10-CM | POA: Diagnosis not present

## 2023-07-15 DIAGNOSIS — F0153 Vascular dementia, unspecified severity, with mood disturbance: Secondary | ICD-10-CM | POA: Diagnosis not present

## 2023-07-15 DIAGNOSIS — R634 Abnormal weight loss: Secondary | ICD-10-CM | POA: Diagnosis not present

## 2023-07-16 DIAGNOSIS — I672 Cerebral atherosclerosis: Secondary | ICD-10-CM | POA: Diagnosis not present

## 2023-07-16 DIAGNOSIS — R634 Abnormal weight loss: Secondary | ICD-10-CM | POA: Diagnosis not present

## 2023-07-16 DIAGNOSIS — I10 Essential (primary) hypertension: Secondary | ICD-10-CM | POA: Diagnosis not present

## 2023-07-16 DIAGNOSIS — F0153 Vascular dementia, unspecified severity, with mood disturbance: Secondary | ICD-10-CM | POA: Diagnosis not present

## 2023-07-16 DIAGNOSIS — I69318 Other symptoms and signs involving cognitive functions following cerebral infarction: Secondary | ICD-10-CM | POA: Diagnosis not present

## 2023-07-16 DIAGNOSIS — F0154 Vascular dementia, unspecified severity, with anxiety: Secondary | ICD-10-CM | POA: Diagnosis not present

## 2023-07-17 DIAGNOSIS — F0154 Vascular dementia, unspecified severity, with anxiety: Secondary | ICD-10-CM | POA: Diagnosis not present

## 2023-07-17 DIAGNOSIS — I672 Cerebral atherosclerosis: Secondary | ICD-10-CM | POA: Diagnosis not present

## 2023-07-17 DIAGNOSIS — F0153 Vascular dementia, unspecified severity, with mood disturbance: Secondary | ICD-10-CM | POA: Diagnosis not present

## 2023-07-17 DIAGNOSIS — I10 Essential (primary) hypertension: Secondary | ICD-10-CM | POA: Diagnosis not present

## 2023-07-17 DIAGNOSIS — I69318 Other symptoms and signs involving cognitive functions following cerebral infarction: Secondary | ICD-10-CM | POA: Diagnosis not present

## 2023-07-17 DIAGNOSIS — R634 Abnormal weight loss: Secondary | ICD-10-CM | POA: Diagnosis not present

## 2023-07-18 DIAGNOSIS — I672 Cerebral atherosclerosis: Secondary | ICD-10-CM | POA: Diagnosis not present

## 2023-07-18 DIAGNOSIS — R634 Abnormal weight loss: Secondary | ICD-10-CM | POA: Diagnosis not present

## 2023-07-18 DIAGNOSIS — F0153 Vascular dementia, unspecified severity, with mood disturbance: Secondary | ICD-10-CM | POA: Diagnosis not present

## 2023-07-18 DIAGNOSIS — I10 Essential (primary) hypertension: Secondary | ICD-10-CM | POA: Diagnosis not present

## 2023-07-18 DIAGNOSIS — F0154 Vascular dementia, unspecified severity, with anxiety: Secondary | ICD-10-CM | POA: Diagnosis not present

## 2023-07-18 DIAGNOSIS — I69318 Other symptoms and signs involving cognitive functions following cerebral infarction: Secondary | ICD-10-CM | POA: Diagnosis not present

## 2023-07-19 DIAGNOSIS — R634 Abnormal weight loss: Secondary | ICD-10-CM | POA: Diagnosis not present

## 2023-07-19 DIAGNOSIS — I672 Cerebral atherosclerosis: Secondary | ICD-10-CM | POA: Diagnosis not present

## 2023-07-19 DIAGNOSIS — F0153 Vascular dementia, unspecified severity, with mood disturbance: Secondary | ICD-10-CM | POA: Diagnosis not present

## 2023-07-19 DIAGNOSIS — F0154 Vascular dementia, unspecified severity, with anxiety: Secondary | ICD-10-CM | POA: Diagnosis not present

## 2023-07-19 DIAGNOSIS — Z23 Encounter for immunization: Secondary | ICD-10-CM | POA: Diagnosis not present

## 2023-07-19 DIAGNOSIS — I10 Essential (primary) hypertension: Secondary | ICD-10-CM | POA: Diagnosis not present

## 2023-07-19 DIAGNOSIS — I69318 Other symptoms and signs involving cognitive functions following cerebral infarction: Secondary | ICD-10-CM | POA: Diagnosis not present

## 2023-07-20 DIAGNOSIS — F0154 Vascular dementia, unspecified severity, with anxiety: Secondary | ICD-10-CM | POA: Diagnosis not present

## 2023-07-20 DIAGNOSIS — I10 Essential (primary) hypertension: Secondary | ICD-10-CM | POA: Diagnosis not present

## 2023-07-20 DIAGNOSIS — I672 Cerebral atherosclerosis: Secondary | ICD-10-CM | POA: Diagnosis not present

## 2023-07-20 DIAGNOSIS — I69318 Other symptoms and signs involving cognitive functions following cerebral infarction: Secondary | ICD-10-CM | POA: Diagnosis not present

## 2023-07-20 DIAGNOSIS — F0153 Vascular dementia, unspecified severity, with mood disturbance: Secondary | ICD-10-CM | POA: Diagnosis not present

## 2023-07-20 DIAGNOSIS — R634 Abnormal weight loss: Secondary | ICD-10-CM | POA: Diagnosis not present

## 2023-07-21 DIAGNOSIS — F0154 Vascular dementia, unspecified severity, with anxiety: Secondary | ICD-10-CM | POA: Diagnosis not present

## 2023-07-21 DIAGNOSIS — F0153 Vascular dementia, unspecified severity, with mood disturbance: Secondary | ICD-10-CM | POA: Diagnosis not present

## 2023-07-21 DIAGNOSIS — I10 Essential (primary) hypertension: Secondary | ICD-10-CM | POA: Diagnosis not present

## 2023-07-21 DIAGNOSIS — R634 Abnormal weight loss: Secondary | ICD-10-CM | POA: Diagnosis not present

## 2023-07-21 DIAGNOSIS — I69318 Other symptoms and signs involving cognitive functions following cerebral infarction: Secondary | ICD-10-CM | POA: Diagnosis not present

## 2023-07-21 DIAGNOSIS — I672 Cerebral atherosclerosis: Secondary | ICD-10-CM | POA: Diagnosis not present

## 2023-07-22 DIAGNOSIS — I10 Essential (primary) hypertension: Secondary | ICD-10-CM | POA: Diagnosis not present

## 2023-07-22 DIAGNOSIS — R634 Abnormal weight loss: Secondary | ICD-10-CM | POA: Diagnosis not present

## 2023-07-22 DIAGNOSIS — I69318 Other symptoms and signs involving cognitive functions following cerebral infarction: Secondary | ICD-10-CM | POA: Diagnosis not present

## 2023-07-22 DIAGNOSIS — F0153 Vascular dementia, unspecified severity, with mood disturbance: Secondary | ICD-10-CM | POA: Diagnosis not present

## 2023-07-22 DIAGNOSIS — F0154 Vascular dementia, unspecified severity, with anxiety: Secondary | ICD-10-CM | POA: Diagnosis not present

## 2023-07-22 DIAGNOSIS — I672 Cerebral atherosclerosis: Secondary | ICD-10-CM | POA: Diagnosis not present

## 2023-07-23 DIAGNOSIS — I69318 Other symptoms and signs involving cognitive functions following cerebral infarction: Secondary | ICD-10-CM | POA: Diagnosis not present

## 2023-07-23 DIAGNOSIS — F0153 Vascular dementia, unspecified severity, with mood disturbance: Secondary | ICD-10-CM | POA: Diagnosis not present

## 2023-07-23 DIAGNOSIS — R634 Abnormal weight loss: Secondary | ICD-10-CM | POA: Diagnosis not present

## 2023-07-23 DIAGNOSIS — I10 Essential (primary) hypertension: Secondary | ICD-10-CM | POA: Diagnosis not present

## 2023-07-23 DIAGNOSIS — I672 Cerebral atherosclerosis: Secondary | ICD-10-CM | POA: Diagnosis not present

## 2023-07-23 DIAGNOSIS — F0154 Vascular dementia, unspecified severity, with anxiety: Secondary | ICD-10-CM | POA: Diagnosis not present

## 2023-07-24 DIAGNOSIS — F0153 Vascular dementia, unspecified severity, with mood disturbance: Secondary | ICD-10-CM | POA: Diagnosis not present

## 2023-07-24 DIAGNOSIS — I10 Essential (primary) hypertension: Secondary | ICD-10-CM | POA: Diagnosis not present

## 2023-07-24 DIAGNOSIS — F0154 Vascular dementia, unspecified severity, with anxiety: Secondary | ICD-10-CM | POA: Diagnosis not present

## 2023-07-24 DIAGNOSIS — R634 Abnormal weight loss: Secondary | ICD-10-CM | POA: Diagnosis not present

## 2023-07-24 DIAGNOSIS — I69318 Other symptoms and signs involving cognitive functions following cerebral infarction: Secondary | ICD-10-CM | POA: Diagnosis not present

## 2023-07-24 DIAGNOSIS — I672 Cerebral atherosclerosis: Secondary | ICD-10-CM | POA: Diagnosis not present

## 2023-07-25 DIAGNOSIS — I672 Cerebral atherosclerosis: Secondary | ICD-10-CM | POA: Diagnosis not present

## 2023-07-25 DIAGNOSIS — R634 Abnormal weight loss: Secondary | ICD-10-CM | POA: Diagnosis not present

## 2023-07-25 DIAGNOSIS — I10 Essential (primary) hypertension: Secondary | ICD-10-CM | POA: Diagnosis not present

## 2023-07-25 DIAGNOSIS — I69318 Other symptoms and signs involving cognitive functions following cerebral infarction: Secondary | ICD-10-CM | POA: Diagnosis not present

## 2023-07-25 DIAGNOSIS — F0153 Vascular dementia, unspecified severity, with mood disturbance: Secondary | ICD-10-CM | POA: Diagnosis not present

## 2023-07-25 DIAGNOSIS — F0154 Vascular dementia, unspecified severity, with anxiety: Secondary | ICD-10-CM | POA: Diagnosis not present

## 2023-07-26 DIAGNOSIS — F0153 Vascular dementia, unspecified severity, with mood disturbance: Secondary | ICD-10-CM | POA: Diagnosis not present

## 2023-07-26 DIAGNOSIS — R634 Abnormal weight loss: Secondary | ICD-10-CM | POA: Diagnosis not present

## 2023-07-26 DIAGNOSIS — I69318 Other symptoms and signs involving cognitive functions following cerebral infarction: Secondary | ICD-10-CM | POA: Diagnosis not present

## 2023-07-26 DIAGNOSIS — F0154 Vascular dementia, unspecified severity, with anxiety: Secondary | ICD-10-CM | POA: Diagnosis not present

## 2023-07-26 DIAGNOSIS — I672 Cerebral atherosclerosis: Secondary | ICD-10-CM | POA: Diagnosis not present

## 2023-07-26 DIAGNOSIS — I10 Essential (primary) hypertension: Secondary | ICD-10-CM | POA: Diagnosis not present

## 2023-07-27 DIAGNOSIS — R634 Abnormal weight loss: Secondary | ICD-10-CM | POA: Diagnosis not present

## 2023-07-27 DIAGNOSIS — I69318 Other symptoms and signs involving cognitive functions following cerebral infarction: Secondary | ICD-10-CM | POA: Diagnosis not present

## 2023-07-27 DIAGNOSIS — F0153 Vascular dementia, unspecified severity, with mood disturbance: Secondary | ICD-10-CM | POA: Diagnosis not present

## 2023-07-27 DIAGNOSIS — I672 Cerebral atherosclerosis: Secondary | ICD-10-CM | POA: Diagnosis not present

## 2023-07-27 DIAGNOSIS — I10 Essential (primary) hypertension: Secondary | ICD-10-CM | POA: Diagnosis not present

## 2023-07-27 DIAGNOSIS — F0154 Vascular dementia, unspecified severity, with anxiety: Secondary | ICD-10-CM | POA: Diagnosis not present

## 2023-07-28 DIAGNOSIS — R634 Abnormal weight loss: Secondary | ICD-10-CM | POA: Diagnosis not present

## 2023-07-28 DIAGNOSIS — I672 Cerebral atherosclerosis: Secondary | ICD-10-CM | POA: Diagnosis not present

## 2023-07-28 DIAGNOSIS — F0154 Vascular dementia, unspecified severity, with anxiety: Secondary | ICD-10-CM | POA: Diagnosis not present

## 2023-07-28 DIAGNOSIS — F0153 Vascular dementia, unspecified severity, with mood disturbance: Secondary | ICD-10-CM | POA: Diagnosis not present

## 2023-07-28 DIAGNOSIS — R4182 Altered mental status, unspecified: Secondary | ICD-10-CM | POA: Diagnosis not present

## 2023-07-28 DIAGNOSIS — I10 Essential (primary) hypertension: Secondary | ICD-10-CM | POA: Diagnosis not present

## 2023-07-28 DIAGNOSIS — I69318 Other symptoms and signs involving cognitive functions following cerebral infarction: Secondary | ICD-10-CM | POA: Diagnosis not present

## 2023-07-29 ENCOUNTER — Non-Acute Institutional Stay (SKILLED_NURSING_FACILITY): Payer: Self-pay | Admitting: Adult Health

## 2023-07-29 ENCOUNTER — Encounter: Payer: Self-pay | Admitting: Adult Health

## 2023-07-29 DIAGNOSIS — K219 Gastro-esophageal reflux disease without esophagitis: Secondary | ICD-10-CM | POA: Diagnosis not present

## 2023-07-29 DIAGNOSIS — Z9181 History of falling: Secondary | ICD-10-CM | POA: Diagnosis not present

## 2023-07-29 DIAGNOSIS — E039 Hypothyroidism, unspecified: Secondary | ICD-10-CM | POA: Diagnosis not present

## 2023-07-29 DIAGNOSIS — F0154 Vascular dementia, unspecified severity, with anxiety: Secondary | ICD-10-CM | POA: Diagnosis not present

## 2023-07-29 DIAGNOSIS — I69318 Other symptoms and signs involving cognitive functions following cerebral infarction: Secondary | ICD-10-CM | POA: Diagnosis not present

## 2023-07-29 DIAGNOSIS — S2243XD Multiple fractures of ribs, bilateral, subsequent encounter for fracture with routine healing: Secondary | ICD-10-CM | POA: Diagnosis not present

## 2023-07-29 DIAGNOSIS — I672 Cerebral atherosclerosis: Secondary | ICD-10-CM | POA: Diagnosis not present

## 2023-07-29 DIAGNOSIS — E785 Hyperlipidemia, unspecified: Secondary | ICD-10-CM | POA: Diagnosis not present

## 2023-07-29 DIAGNOSIS — M48061 Spinal stenosis, lumbar region without neurogenic claudication: Secondary | ICD-10-CM | POA: Diagnosis not present

## 2023-07-29 DIAGNOSIS — F0153 Vascular dementia, unspecified severity, with mood disturbance: Secondary | ICD-10-CM | POA: Diagnosis not present

## 2023-07-29 DIAGNOSIS — I10 Essential (primary) hypertension: Secondary | ICD-10-CM | POA: Diagnosis not present

## 2023-07-29 DIAGNOSIS — R634 Abnormal weight loss: Secondary | ICD-10-CM | POA: Diagnosis not present

## 2023-07-29 DIAGNOSIS — N3000 Acute cystitis without hematuria: Secondary | ICD-10-CM | POA: Diagnosis not present

## 2023-07-29 MED ORDER — CEPHALEXIN 500 MG PO CAPS: 500.0000 mg | ORAL_CAPSULE | Freq: Three times a day (TID) | ORAL | Status: DC

## 2023-07-29 MED ORDER — OXYBUTYNIN CHLORIDE 5 MG PO TABS: 5.0000 mg | ORAL_TABLET | Freq: Every day | ORAL | Status: AC

## 2023-07-29 NOTE — Progress Notes (Signed)
Location:  Medical illustrator of Service:  SNF (31) Provider:   Peggye Ley, ANP [facility] [phone]   Mahlon Gammon, MD  Patient Care Team: Mahlon Gammon, MD as PCP - General (Internal Medicine) Nahser, Deloris Ping, MD as PCP - Cardiology (Cardiology) Vida Rigger, MD as Consulting Physician (Gastroenterology) Levert Feinstein, MD as Consulting Physician (Neurology) Barnett Abu, MD as Consulting Physician (Neurosurgery) [place], Amy, MD as Consulting Physician (Dermatology) Jerene Bears, MD as Consulting Physician (Gynecology) [place], Heather, [place] (Optometry) Mahlon Gammon, MD (Internal Medicine)  Extended Emergency Contact Information Primary Emergency Contact: Lipsitz,Robert J Address: [address]          [place], [place] [address] [place] of [place] Home Phone: [phone] Work Phone: [phone] Mobile Phone: [phone] Relation: Spouse Secondary Emergency Contact: [place] of [place] Home Phone: [phone] Mobile Phone: [phone] Relation: Son  Code Status:  DNR Goals of care: Advanced Directive information    07/14/2023   11:17 AM  Advanced Directives  Does Patient Have a Medical Advance Directive? Yes  Type of Estate agent of [place];Out of facility DNR (pink MOST or yellow form);Living will  Does patient want to make changes to medical advance directive? No - Patient declined  Copy of Healthcare Power of Attorney in Chart? Yes - validated most recent copy scanned in chart (See row information)  Pre-existing out of facility DNR order (yellow form or pink MOST form) Pink MOST form placed in chart (order not valid for inpatient use);Yellow form placed in chart (order not valid for inpatient use)     Chief Complaint  Patient presents with   Acute Visit    UTI    HPI:  Pt is a 82 y.o. female seen today for an acute visit for UTI  She is followed by hospice  due to Vascular dementia. Overall is fairly stable Hx of OAB change from myrbetriq to oxybutynin recently by hospice.  Nurse reported confusion, temp 99.4, foul and cloudy urine. UA C and S obtained. Initial UA showing WBC 11-20, 1+ bacteria, 2+ nitrite, and leukocyte esterase 250.  Started on Keflex on 10/31 For my visit today she is at baseline. She is not confused beyond her baseline. No urinary symptoms.  Has received 2 doses of antibiotic.   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 Street of back injections by Dr. Murray Hodgkins 2016 with good relief, history of cervical DD with possiblity of surgery by Dr. Dutch Quint.   GERD (gastroesophageal reflux disease)    IBS with moderate refluc seen on upper GI study form January 2011 Dr. Ewing Schlein   HTN (hypertension)    no meds   Hyperlipidemia    Hypothyroidism    IBS (irritable bowel syndrome)    Imbalance    Impingement syndrome of right shoulder 01/05/2013   Mass of left side of neck    Memory loss    Mood disorder (HCC)    MVP (mitral valve prolapse)    Hx of    Osteopenia    Peripheral edema    Right rotator cuff tear 01/05/2013   Sciatic pain  recurrent   Seasonal allergies    Stroke [facility])    Tension headache    TIA (transient ischemic attack)    Tremor    Past Surgical History:  Procedure Laterality Date   ANTERIOR CERVICAL DECOMP/DISCECTOMY FUSION  04/2015    Dr. Rise Mu, [facility]   CARDIAC CATHETERIZATION  2004   which revealed smooth and normal coronary arteries   CARPAL TUNNEL RELEASE  2000   lt   CATARACT EXTRACTION W/ INTRAOCULAR LENS  IMPLANT, BILATERAL  1/17   Dr. Delaney Meigs   COSMETIC SURGERY  2010   eyes-facial   DENTAL SURGERY  1960    LASIK     LOOP RECORDER INSERTION N/A 09/22/2017   Procedure: LOOP RECORDER INSERTION;  Surgeon: Regan Lemming, MD;  [facility] INVASIVE CV LAB;  Service: Cardiovascular;  Laterality: N/A;   LUMBAR LAMINECTOMY/DECOMPRESSION MICRODISCECTOMY N/A 06/08/2021   Procedure: Laminectomy and Foraminotomy - L4-L5, L5-S1;  Surgeon: Julio Sicks, MD;  Location: [facility] OR;  Service: Neurosurgery;  Laterality: N/A;   SHOULDER ARTHROSCOPY WITH ROTATOR CUFF REPAIR Right 01/05/2013   Procedure: SHOULDER ARTHROSCOPY WITH ARTHROSCOPIC  ROTATOR CUFF REPAIR, ACROMIOPLASTY, EXTENSIVE DEBRIDEMENT;  Surgeon: Eulas Post, MD;  Location: [facility];  Service: Orthopedics;  Laterality: Right;   TEE WITHOUT CARDIOVERSION N/A 09/22/2017   Procedure: TRANSESOPHAGEAL ECHOCARDIOGRAM (TEE);  Surgeon: [place] Fair, MD;  [facility]: [facility] ENDOS[facility];  Service: Cardiovascular;  Laterality: N/A;   TONSILLECTOMY     TUBAL LIGATION      Allergies  Allergen Reactions   Fluticasone Other (See Comments)    DRY EYES   Aciphex [Rabeprazole] Other (See Comments)    Critical    Codeine Nausea Only   Codeine Other (See Comments)    unknown   Cymbalta [Duloxetine Hcl] Other (See Comments)    Critical    Lexapro [Escitalopram] Other (See Comments)    critical   Lipitor [Atorvastatin Calcium] Other (See Comments)    Intolerant    Morphine Sulfate     Per matrix   Pneumovax [Pneumococcal Polysaccharide Vaccine] Other (See Comments)    moderate   Prevacid [Lansoprazole] Other (See Comments)    Unknown, listed on MAR   Rosuvastatin Other (See Comments)    Intolerant    Sertraline Other (See Comments)    critical   Statins Other (See Comments)    Critical    Sulfa Antibiotics Nausea And Vomiting   Tetracyclines & Related Other (See Comments)    Morphine Sulfate   Welchol [Colesevelam] Other (See Comments)    Unknown, listed on MAR   Pneumovax 23 [Pneumococcal Vac Polyvalent] Rash    Injection site  reaction    Outpatient Encounter Medications as of 07/29/2023  Medication Sig   cephALEXin (KEFLEX) 500 MG capsule Take 1 capsule (500 mg total) by mouth 3 (three) times daily.   oxybutynin (DITROPAN) 5 MG tablet Take 1 tablet (5 mg total) by mouth daily.   acetaminophen (TYLENOL) 325 MG tablet Take 650 mg by mouth every 6 (six) hours as needed.   clopidogrel (PLAVIX) 75 MG tablet Take 1 tablet (75 mg total) by mouth daily.   diclofenac Sodium (VOLTAREN) 1 % GEL Apply 2 g topically 2 (two) times daily as needed (pain).   gabapentin (NEURONTIN) 100 MG capsule Take 100 mg by mouth 2 (two) times daily.   levothyroxine (SYNTHROID) 88 MCG tablet Take 88 mcg by mouth daily before breakfast.   memantine (NAMENDA) 5 MG tablet Take 5 mg by mouth 2 (two)  times daily.   omeprazole (PRILOSEC) 40 MG capsule Take 40 mg by mouth daily.   polyethylene glycol (MIRALAX) 17 g packet Take 17 g by mouth daily.   propranolol (INDERAL) 20 MG tablet Take 20 mg by mouth 2 (two) times daily. 1 tablet (20 mg) oral, Twice A Day   [DISCONTINUED] mirabegron ER (MYRBETRIQ) 25 MG TB24 tablet Take 1 tablet (25 mg total) by mouth daily.   No facility-administered encounter medications on file as of 07/29/2023.    Review of Systems  Constitutional:  Negative for activity change, appetite change, chills, diaphoresis, fatigue, fever and unexpected weight change.  HENT:  Negative for congestion.   Respiratory:  Negative for cough, shortness of breath and wheezing.   Cardiovascular:  Negative for chest pain, palpitations and leg swelling.  Gastrointestinal:  Negative for abdominal distention, abdominal pain, constipation and diarrhea.  Genitourinary:  Negative for difficulty urinating, dysuria, flank pain and frequency.  Musculoskeletal:  Positive for arthralgias and gait problem. Negative for back pain, joint swelling and myalgias.  Neurological:  Negative for dizziness, tremors, seizures, syncope, facial asymmetry, speech  difficulty, weakness, light-headedness, numbness and headaches.  Psychiatric/Behavioral:  Positive for confusion. Negative for agitation and behavioral problems.     Immunization History  Administered Date(s) Administered   Fluad Quad(high Dose 65+) 07/02/2022   Influenza, High Dose Seasonal PF 07/23/2017, 07/03/2019   Influenza-Unspecified 05/27/2010, 08/03/2010, 07/22/2011, 07/01/2012, 06/22/2013, 07/18/2020, 07/11/2021, 07/02/2022   Janssen (J&J) SARS-COV-2 Vaccination 11/06/2019   Moderna SARS-COV2 Booster Vaccination 08/05/2021   Moderna Sars-Covid-2 Vaccination 08/07/2020   PFIZER(Purple Top)SARS-COV-2 Vaccination 10/09/2019, 08/07/2020   Pfizer Covid-19 Vaccine Bivalent Booster 53yrs & up 01/21/2023   Pneumococcal Conjugate-13 02/08/2015   Pneumococcal Polysaccharide-23 05/28/2006, 10/13/2011   Pneumococcal-Unspecified 10/17/2013   Td 03/29/2002   Tdap 09/27/2001, 04/25/2002, 08/18/2012, 10/17/2013   Zoster Recombinant(Shingrix) 05/23/2021, 07/23/2021   Zoster, Live 08/30/2006   Zoster, Unspecified 08/30/2006   Pertinent  Health Maintenance Due  Topic Date Due   INFLUENZA VACCINE  04/28/2023   DEXA SCAN  Completed      08/07/2022   11:17 AM 08/07/2022   11:03 PM 08/08/2022    8:00 PM 08/09/2022    8:00 AM 08/17/2022   10:59 AM  Fall Risk  Falls in the past year?     1  Was there an injury with Fall?     1  Fall Risk Category Calculator     3  Fall Risk Category (Retired)     High  (RETIRED) Patient Fall Risk Level High fall risk High fall risk High fall risk High fall risk High fall risk  Patient at Risk for Falls Due to     History of fall(s);Impaired balance/gait;Impaired mobility  Fall risk Follow up     Falls evaluation completed   Functional Status Survey:    Vitals:   07/29/23 1100  BP: (!) 141/67  Pulse: 78  Resp: 16  Temp: (!) 97.2 F (36.2 C)  SpO2: 94%   There is no height or weight on file to calculate BMI. Physical Exam Vitals and nursing  note reviewed.  Constitutional:      General: She is not in acute distress.    Appearance: She is not diaphoretic.  HENT:     Head: Normocephalic and atraumatic.  Neck:     Vascular: No JVD.  Cardiovascular:     Rate and Rhythm: Normal rate and regular rhythm.     Heart sounds: No murmur heard. Pulmonary:     Effort:  Pulmonary effort is normal. No respiratory distress.     Breath sounds: Normal breath sounds. No wheezing.  Abdominal:     General: Bowel sounds are normal. There is no distension.     Palpations: Abdomen is soft.     Tenderness: There is no abdominal tenderness. There is no right CVA tenderness or left CVA tenderness.  Skin:    General: Skin is warm and dry.  Neurological:     General: No focal deficit present.     Mental Status: She is alert. Mental status is at baseline.     Labs reviewed: Recent Labs    08/07/22 1122 08/07/22 1615 08/07/22 2343 12/22/22 1850 01/24/23 0000 01/25/23 0000  NA 141  --    < > 138 136* 137  K 4.2  --    < > 4.1 4.4 4.3  CL 104  --    < > 103 100 100  CO2 29  --    < > 25 25* 24*  GLUCOSE 99  --   --  108*  --   --   BUN 14  --    < > 11 12 12   CREATININE 0.95  --    < > 0.76 0.7 0.6  CALCIUM 9.2  --    < > 8.6* 8.6* 8.6*  MG  --  2.2  --   --   --   --   PHOS  --  4.0  --   --   --   --    < > = values in this interval not displayed.   Recent Labs    08/07/22 1615 08/16/22 0000 08/16/22 1300 12/22/22 1850 01/24/23 0000  AST 20   < > 16 16 27   ALT 14   < > 14 21 36*  ALKPHOS 69   < > 100 109 167*  BILITOT 0.5  --   --  0.8  --   PROT 6.8  --   --  6.9  --   ALBUMIN 3.6   < > 4.0 3.1* 3.7   < > = values in this interval not displayed.   Recent Labs    08/07/22 1122 08/07/22 2343 08/18/22 0000 12/22/22 1850 01/24/23 0000  WBC 9.9  --  7.2 9.7 9.3  NEUTROABS 7.5  --   --   --  7.10  HGB 14.1   < > 13.5 13.4 13.1  HCT 43.4   < > 41 41.3 38  MCV 98.9  --   --  96.9  --   PLT 301  --  298 350 341   < > =  values in this interval not displayed.   Lab Results  Component Value Date   TSH 0.57 01/25/2023   Lab Results  Component Value Date   HGBA1C 5.6 09/21/2017   Lab Results  Component Value Date   CHOL 174 03/23/2022   HDL 66 03/23/2022   LDLCALC 81 03/23/2022   TRIG 134 03/23/2022   CHOLHDL 2.1 09/21/2017    Significant Diagnostic Results in last 30 days:  No results found.  Assessment/Plan 1. Acute cystitis without hematuria Culture pending, if negative will d/c keflex Ensure criteria is met when checking UA (foul urine is not a symptom) Did recently change to oxybutynin from myrbetriq which can affect mentation  - cephALEXin (KEFLEX) 500 MG capsule; Take 1 capsule (500 mg total) by mouth 3 (three) times daily.    Family/ staff Communication: Nurse  Labs/tests  ordered:  Urine culture pending.

## 2023-08-04 DIAGNOSIS — I10 Essential (primary) hypertension: Secondary | ICD-10-CM | POA: Diagnosis not present

## 2023-08-04 DIAGNOSIS — R634 Abnormal weight loss: Secondary | ICD-10-CM | POA: Diagnosis not present

## 2023-08-04 DIAGNOSIS — I672 Cerebral atherosclerosis: Secondary | ICD-10-CM | POA: Diagnosis not present

## 2023-08-04 DIAGNOSIS — F0154 Vascular dementia, unspecified severity, with anxiety: Secondary | ICD-10-CM | POA: Diagnosis not present

## 2023-08-04 DIAGNOSIS — F0153 Vascular dementia, unspecified severity, with mood disturbance: Secondary | ICD-10-CM | POA: Diagnosis not present

## 2023-08-04 DIAGNOSIS — I69318 Other symptoms and signs involving cognitive functions following cerebral infarction: Secondary | ICD-10-CM | POA: Diagnosis not present

## 2023-08-08 ENCOUNTER — Non-Acute Institutional Stay (SKILLED_NURSING_FACILITY): Payer: Self-pay | Admitting: Internal Medicine

## 2023-08-08 ENCOUNTER — Encounter: Payer: Self-pay | Admitting: Internal Medicine

## 2023-08-08 DIAGNOSIS — M48062 Spinal stenosis, lumbar region with neurogenic claudication: Secondary | ICD-10-CM | POA: Diagnosis not present

## 2023-08-08 DIAGNOSIS — R251 Tremor, unspecified: Secondary | ICD-10-CM

## 2023-08-08 DIAGNOSIS — N3281 Overactive bladder: Secondary | ICD-10-CM | POA: Diagnosis not present

## 2023-08-08 DIAGNOSIS — K5903 Drug induced constipation: Secondary | ICD-10-CM | POA: Diagnosis not present

## 2023-08-08 DIAGNOSIS — R5383 Other fatigue: Secondary | ICD-10-CM | POA: Insufficient documentation

## 2023-08-08 DIAGNOSIS — Z515 Encounter for palliative care: Secondary | ICD-10-CM

## 2023-08-08 DIAGNOSIS — E039 Hypothyroidism, unspecified: Secondary | ICD-10-CM

## 2023-08-08 DIAGNOSIS — F0150 Vascular dementia without behavioral disturbance: Secondary | ICD-10-CM

## 2023-08-08 NOTE — Progress Notes (Signed)
Location:  Medical illustrator of Service:  SNF (31)  Provider:   Code Status: DNR/Hospice Goals of Care:     07/14/2023   11:17 AM  Advanced Directives  Does Patient Have a Medical Advance Directive? Yes  Type of Estate agent of [place];Out of facility DNR (pink MOST or yellow form);Living will  Does patient want to make changes to medical advance directive? No - Patient declined  Copy of Healthcare Power of Attorney in Chart? Yes - validated most recent copy scanned in chart (See row information)  Pre-existing out of facility DNR order (yellow form or pink MOST form) Pink MOST form placed in chart (order not valid for inpatient use);Yellow form placed in chart (order not valid for inpatient use)     Chief Complaint  Patient presents with   Care Management    HPI: Patient is a 82 y.o. female seen today for medical management of chronic diseases.   Lives in [place] in SNF   Patient has h/o  Vascular dementia Worsening Cognition Enrolled in hospice Chronic Pain in  neck and back pain Depression and Anxiety S/p CVA in 2018  Essential Tremor, Hypothyroidism, Hyperlipidemia,  S/P L4-5, L5-S1 decompressive laminectomy with foraminotomies due to Severe Back pain Done on 06/08/21    She is stable. No new Nursing issues. 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 Street  of back injections by Dr. Murray Hodgkins 2016 with good relief, history of cervical DD with possiblity of surgery by Dr. Dutch Quint.   GERD (gastroesophageal reflux disease)    IBS with moderate refluc seen on upper GI study form January 2011 Dr. Ewing Schlein   HTN (hypertension)    no meds   Hyperlipidemia    Hypothyroidism    IBS (irritable bowel syndrome)    Imbalance    Impingement syndrome of right shoulder 01/05/2013   Mass of left side of neck    Memory loss    Mood disorder (HCC)    MVP (mitral valve prolapse)    Hx of    Osteopenia    Peripheral edema    Right rotator cuff tear 01/05/2013   Sciatic pain    recurrent   Seasonal allergies    Stroke [facility])    Tension headache    TIA (transient ischemic attack)    Tremor     Past Surgical History:  Procedure Laterality Date   ANTERIOR CERVICAL DECOMP/DISCECTOMY FUSION  04/2015    Dr. Rise Mu, [facility]   CARDIAC CATHETERIZATION  2004   which revealed smooth and normal coronary arteries   CARPAL TUNNEL RELEASE  2000   lt   CATARACT EXTRACTION W/ INTRAOCULAR LENS  IMPLANT, BILATERAL  1/17   Dr. Delaney Meigs   COSMETIC SURGERY  2010   eyes-facial   DENTAL SURGERY  1960  LASIK     LOOP RECORDER INSERTION N/A 09/22/2017   Procedure: LOOP RECORDER INSERTION;  Surgeon: Regan Lemming, MD;  [facility];  Service: Cardiovascular;  Laterality: N/A;   LUMBAR LAMINECTOMY/DECOMPRESSION MICRODISCECTOMY N/A 06/08/2021   Procedure: Laminectomy and Foraminotomy - L4-L5, L5-S1;  Surgeon: Julio Sicks, MD;  Location: [facility] OR;  Service: Neurosurgery;  Laterality: N/A;   SHOULDER ARTHROSCOPY WITH ROTATOR CUFF REPAIR Right 01/05/2013   Procedure: SHOULDER ARTHROSCOPY WITH ARTHROSCOPIC  ROTATOR CUFF REPAIR, ACROMIOPLASTY, EXTENSIVE DEBRIDEMENT;  Surgeon: Eulas Post, MD;  Location: [facility];  Service: Orthopedics;  Laterality: Right;   TEE WITHOUT CARDIOVERSION N/A 09/22/2017   Procedure: TRANSESOPHAGEAL ECHOCARDIOGRAM  (TEE);  Surgeon: [place] Fair, MD;  [facility]: [facility];  Service: Cardiovascular;  Laterality: N/A;   TONSILLECTOMY     TUBAL LIGATION      Allergies  Allergen Reactions   Fluticasone Other (See Comments)    DRY EYES   Aciphex [Rabeprazole] Other (See Comments)    Critical    Codeine Nausea Only   Codeine Other (See Comments)    unknown   Cymbalta [Duloxetine Hcl] Other (See Comments)    Critical    Lexapro [Escitalopram] Other (See Comments)    critical   Lipitor [Atorvastatin Calcium] Other (See Comments)    Intolerant    Morphine Sulfate     Per matrix   Pneumovax [Pneumococcal Polysaccharide Vaccine] Other (See Comments)    moderate   Prevacid [Lansoprazole] Other (See Comments)    Unknown, listed on MAR   Rosuvastatin Other (See Comments)    Intolerant    Sertraline Other (See Comments)    critical   Statins Other (See Comments)    Critical    Sulfa Antibiotics Nausea And Vomiting   Tetracyclines & Related Other (See Comments)    Morphine Sulfate   Welchol [Colesevelam] Other (See Comments)    Unknown, listed on MAR   Pneumovax 23 [Pneumococcal Vac Polyvalent] Rash    Injection site reaction    Outpatient Encounter Medications as of 08/08/2023  Medication Sig   acetaminophen (TYLENOL) 325 MG tablet Take 650 mg by mouth every 6 (six) hours as needed.   clopidogrel (PLAVIX) 75 MG tablet Take 1 tablet (75 mg total) by mouth daily.   diclofenac Sodium (VOLTAREN) 1 % GEL Apply 2 g topically 2 (two) times daily as needed (pain).   gabapentin (NEURONTIN) 100 MG capsule Take 100 mg by mouth 2 (two) times daily.   levothyroxine (SYNTHROID) 88 MCG tablet Take 88 mcg by mouth daily before breakfast.   memantine (NAMENDA) 5 MG tablet Take 5 mg by mouth 2 (two) times daily.   omeprazole (PRILOSEC) 40 MG capsule Take 40 mg by mouth daily.   oxybutynin (DITROPAN) 5 MG tablet Take 1 tablet (5 mg total) by mouth daily.   polyethylene glycol (MIRALAX) 17 g packet Take  17 g by mouth daily.   propranolol (INDERAL) 20 MG tablet Take 20 mg by mouth 2 (two) times daily. 1 tablet (20 mg) oral, Twice A Day   [DISCONTINUED] cephALEXin (KEFLEX) 500 MG capsule Take 1 capsule (500 mg total) by mouth 3 (three) times daily.   No facility-administered encounter medications on file as of 08/08/2023.    Review of Systems:  Review of Systems  Unable to perform ROS: Dementia    Health Maintenance  Topic Date Due   INFLUENZA VACCINE  04/28/2023   COVID-19 Vaccine (7 - 2023-24 season) 05/29/2023   DTaP/Tdap/Td (  6 - Td or Tdap) 10/18/2023   Pneumonia Vaccine 21+ Years old  Completed   DEXA SCAN  Completed   Zoster Vaccines- Shingrix  Completed   HPV VACCINES  Aged Out    Physical Exam: Vitals:   08/08/23 1556  BP: 128/70  Pulse: 64  Resp: 18  Temp: (!) 97 F (36.1 C)  Weight: 144 lb (65.3 kg)   Body mass index is 27.21 kg/m. Physical Exam Vitals reviewed.  Constitutional:      Appearance: Normal appearance.  HENT:     Head: Normocephalic.     Nose: Nose normal.     Mouth/Throat:     Mouth: Mucous membranes are moist.     Pharynx: Oropharynx is clear.  Eyes:     Pupils: Pupils are equal, round, and reactive to light.  Cardiovascular:     Rate and Rhythm: Normal rate and regular rhythm.     Pulses: Normal pulses.     Heart sounds: Normal heart sounds. No murmur heard. Pulmonary:     Effort: Pulmonary effort is normal.     Breath sounds: Normal breath sounds.  Abdominal:     General: Abdomen is flat. Bowel sounds are normal.     Palpations: Abdomen is soft.  Musculoskeletal:     Cervical back: Neck supple.     Comments: Mild Swelling bilateral  Skin:    General: Skin is warm.  Neurological:     General: No focal deficit present.     Mental Status: She is alert.  Psychiatric:        Mood and Affect: Mood normal.        Thought Content: Thought content normal.     Labs reviewed: Basic Metabolic Panel: Recent Labs    12/22/22 1850  01/24/23 0000 01/25/23 0000  NA 138 136* 137  K 4.1 4.4 4.3  CL 103 100 100  CO2 25 25* 24*  GLUCOSE 108*  --   --   BUN 11 12 12   CREATININE 0.76 0.7 0.6  CALCIUM 8.6* 8.6* 8.6*  TSH  --   --  0.57   Liver Function Tests: Recent Labs    08/16/22 1300 12/22/22 1850 01/24/23 0000  AST 16 16 27   ALT 14 21 36*  ALKPHOS 100 109 167*  BILITOT  --  0.8  --   PROT  --  6.9  --   ALBUMIN 4.0 3.1* 3.7   No results for input(s): "LIPASE", "AMYLASE" in the last 8760 hours. No results for input(s): "AMMONIA" in the last 8760 hours. CBC: Recent Labs    08/18/22 0000 12/22/22 1850 01/24/23 0000  WBC 7.2 9.7 9.3  NEUTROABS  --   --  7.10  HGB 13.5 13.4 13.1  HCT 41 41.3 38  MCV  --  96.9  --   PLT 298 350 341   Lipid Panel: No results for input(s): "CHOL", "HDL", "LDLCALC", "TRIG", "CHOLHDL", "LDLDIRECT" in the last 8760 hours. Lab Results  Component Value Date   HGBA1C 5.6 09/21/2017    Procedures since last visit: No results found.  Assessment/Plan 1. Lethargy She is back to normal Was treated wfor UTI and Neurontin decrased  2. Hypothyroidism, unspecified type TSH normal in 4/24  3. Vascular dementia without behavioral disturbance (HCC) On Namenda to help with behaviors Doing well in SNF On Plavix 4. Overactive bladder On Ditropan  5. Lumbar stenosis with neurogenic claudication Tylenol and Neurontin  6. benign Tremors Propanolol  7. Drug-induced constipation Miralax 8  GERD On Prilosec   Labs/tests ordered:  * No order type specified * Next appt:  Visit date not found

## 2023-08-09 DIAGNOSIS — I672 Cerebral atherosclerosis: Secondary | ICD-10-CM | POA: Diagnosis not present

## 2023-08-09 DIAGNOSIS — F0153 Vascular dementia, unspecified severity, with mood disturbance: Secondary | ICD-10-CM | POA: Diagnosis not present

## 2023-08-09 DIAGNOSIS — F0154 Vascular dementia, unspecified severity, with anxiety: Secondary | ICD-10-CM | POA: Diagnosis not present

## 2023-08-09 DIAGNOSIS — R634 Abnormal weight loss: Secondary | ICD-10-CM | POA: Diagnosis not present

## 2023-08-09 DIAGNOSIS — I69318 Other symptoms and signs involving cognitive functions following cerebral infarction: Secondary | ICD-10-CM | POA: Diagnosis not present

## 2023-08-09 DIAGNOSIS — I10 Essential (primary) hypertension: Secondary | ICD-10-CM | POA: Diagnosis not present

## 2023-08-10 DIAGNOSIS — R634 Abnormal weight loss: Secondary | ICD-10-CM | POA: Diagnosis not present

## 2023-08-10 DIAGNOSIS — F0153 Vascular dementia, unspecified severity, with mood disturbance: Secondary | ICD-10-CM | POA: Diagnosis not present

## 2023-08-10 DIAGNOSIS — I672 Cerebral atherosclerosis: Secondary | ICD-10-CM | POA: Diagnosis not present

## 2023-08-10 DIAGNOSIS — F0154 Vascular dementia, unspecified severity, with anxiety: Secondary | ICD-10-CM | POA: Diagnosis not present

## 2023-08-10 DIAGNOSIS — I10 Essential (primary) hypertension: Secondary | ICD-10-CM | POA: Diagnosis not present

## 2023-08-10 DIAGNOSIS — I69318 Other symptoms and signs involving cognitive functions following cerebral infarction: Secondary | ICD-10-CM | POA: Diagnosis not present

## 2023-08-11 DIAGNOSIS — Z515 Encounter for palliative care: Secondary | ICD-10-CM | POA: Insufficient documentation

## 2023-08-18 DIAGNOSIS — E039 Hypothyroidism, unspecified: Secondary | ICD-10-CM | POA: Diagnosis not present

## 2023-08-18 LAB — TSH: TSH: 0.05 — AB (ref 0.41–5.90)

## 2023-08-19 DIAGNOSIS — F0153 Vascular dementia, unspecified severity, with mood disturbance: Secondary | ICD-10-CM | POA: Diagnosis not present

## 2023-08-19 DIAGNOSIS — R634 Abnormal weight loss: Secondary | ICD-10-CM | POA: Diagnosis not present

## 2023-08-19 DIAGNOSIS — I69318 Other symptoms and signs involving cognitive functions following cerebral infarction: Secondary | ICD-10-CM | POA: Diagnosis not present

## 2023-08-19 DIAGNOSIS — I10 Essential (primary) hypertension: Secondary | ICD-10-CM | POA: Diagnosis not present

## 2023-08-19 DIAGNOSIS — I672 Cerebral atherosclerosis: Secondary | ICD-10-CM | POA: Diagnosis not present

## 2023-08-19 DIAGNOSIS — F0154 Vascular dementia, unspecified severity, with anxiety: Secondary | ICD-10-CM | POA: Diagnosis not present

## 2023-08-24 DIAGNOSIS — I10 Essential (primary) hypertension: Secondary | ICD-10-CM | POA: Diagnosis not present

## 2023-08-24 DIAGNOSIS — I672 Cerebral atherosclerosis: Secondary | ICD-10-CM | POA: Diagnosis not present

## 2023-08-24 DIAGNOSIS — R634 Abnormal weight loss: Secondary | ICD-10-CM | POA: Diagnosis not present

## 2023-08-24 DIAGNOSIS — F0154 Vascular dementia, unspecified severity, with anxiety: Secondary | ICD-10-CM | POA: Diagnosis not present

## 2023-08-24 DIAGNOSIS — F0153 Vascular dementia, unspecified severity, with mood disturbance: Secondary | ICD-10-CM | POA: Diagnosis not present

## 2023-08-24 DIAGNOSIS — I69318 Other symptoms and signs involving cognitive functions following cerebral infarction: Secondary | ICD-10-CM | POA: Diagnosis not present

## 2023-08-28 DIAGNOSIS — E039 Hypothyroidism, unspecified: Secondary | ICD-10-CM | POA: Diagnosis not present

## 2023-08-28 DIAGNOSIS — S2243XD Multiple fractures of ribs, bilateral, subsequent encounter for fracture with routine healing: Secondary | ICD-10-CM | POA: Diagnosis not present

## 2023-08-28 DIAGNOSIS — K219 Gastro-esophageal reflux disease without esophagitis: Secondary | ICD-10-CM | POA: Diagnosis not present

## 2023-08-28 DIAGNOSIS — R634 Abnormal weight loss: Secondary | ICD-10-CM | POA: Diagnosis not present

## 2023-08-28 DIAGNOSIS — I69318 Other symptoms and signs involving cognitive functions following cerebral infarction: Secondary | ICD-10-CM | POA: Diagnosis not present

## 2023-08-28 DIAGNOSIS — M48061 Spinal stenosis, lumbar region without neurogenic claudication: Secondary | ICD-10-CM | POA: Diagnosis not present

## 2023-08-28 DIAGNOSIS — Z9181 History of falling: Secondary | ICD-10-CM | POA: Diagnosis not present

## 2023-08-28 DIAGNOSIS — F0153 Vascular dementia, unspecified severity, with mood disturbance: Secondary | ICD-10-CM | POA: Diagnosis not present

## 2023-08-28 DIAGNOSIS — E785 Hyperlipidemia, unspecified: Secondary | ICD-10-CM | POA: Diagnosis not present

## 2023-08-28 DIAGNOSIS — I672 Cerebral atherosclerosis: Secondary | ICD-10-CM | POA: Diagnosis not present

## 2023-08-28 DIAGNOSIS — F0154 Vascular dementia, unspecified severity, with anxiety: Secondary | ICD-10-CM | POA: Diagnosis not present

## 2023-08-28 DIAGNOSIS — I10 Essential (primary) hypertension: Secondary | ICD-10-CM | POA: Diagnosis not present

## 2023-09-02 ENCOUNTER — Non-Acute Institutional Stay (SKILLED_NURSING_FACILITY): Payer: Self-pay | Admitting: Adult Health

## 2023-09-02 ENCOUNTER — Encounter: Payer: Self-pay | Admitting: Adult Health

## 2023-09-02 DIAGNOSIS — F411 Generalized anxiety disorder: Secondary | ICD-10-CM | POA: Diagnosis not present

## 2023-09-02 DIAGNOSIS — M5442 Lumbago with sciatica, left side: Secondary | ICD-10-CM | POA: Diagnosis not present

## 2023-09-02 DIAGNOSIS — F0153 Vascular dementia, unspecified severity, with mood disturbance: Secondary | ICD-10-CM | POA: Diagnosis not present

## 2023-09-02 DIAGNOSIS — N3281 Overactive bladder: Secondary | ICD-10-CM | POA: Diagnosis not present

## 2023-09-02 DIAGNOSIS — F0150 Vascular dementia without behavioral disturbance: Secondary | ICD-10-CM | POA: Diagnosis not present

## 2023-09-02 DIAGNOSIS — I10 Essential (primary) hypertension: Secondary | ICD-10-CM | POA: Diagnosis not present

## 2023-09-02 DIAGNOSIS — R251 Tremor, unspecified: Secondary | ICD-10-CM | POA: Diagnosis not present

## 2023-09-02 DIAGNOSIS — I69318 Other symptoms and signs involving cognitive functions following cerebral infarction: Secondary | ICD-10-CM | POA: Diagnosis not present

## 2023-09-02 DIAGNOSIS — I672 Cerebral atherosclerosis: Secondary | ICD-10-CM | POA: Diagnosis not present

## 2023-09-02 DIAGNOSIS — M5441 Lumbago with sciatica, right side: Secondary | ICD-10-CM | POA: Diagnosis not present

## 2023-09-02 DIAGNOSIS — K5903 Drug induced constipation: Secondary | ICD-10-CM

## 2023-09-02 DIAGNOSIS — R634 Abnormal weight loss: Secondary | ICD-10-CM | POA: Diagnosis not present

## 2023-09-02 DIAGNOSIS — G8929 Other chronic pain: Secondary | ICD-10-CM

## 2023-09-02 DIAGNOSIS — F0154 Vascular dementia, unspecified severity, with anxiety: Secondary | ICD-10-CM | POA: Diagnosis not present

## 2023-09-02 NOTE — Progress Notes (Unsigned)
Location:  [facility] Room Number: 146A Place of Service:  SNF [phone]) Provider:  D[facility]osp[facility]  Mahlon Gammon, MD  Patient Care Team: Mahlon Gammon, MD as PCP - General (Internal Medicine) Nahser, Deloris Ping, MD as PCP - Cardiology (Cardiology) Vida Rigger, MD as Consulting Physician (Gastroenterology) Levert Feinstein, MD as Consulting Physician (Neurology) Barnett Abu, MD as Consulting Physician (Neurosurgery) [place], Amy, MD as Consulting Physician (Dermatology) Jerene Bears, MD as Consulting Physician (Gynecology) [place], Heather, OD (Optometry) Mahlon Gammon, MD (Internal Medicine)  Extended Emergency Contact Information Primary Emergency Contact: Mcelreath,Robert J Address: [address]          [place], [place] [address] [place] of [place] Home Phone: [phone] Work Phone: [phone] Mobile Phone: [phone] Relation: Spouse Secondary Emergency Contact: [place] of [place] Home Phone: [phone] Mobile Phone: [phone] Relation: Son  Code Status:  DNR Hospice Goals of care: Advanced Directive information    09/02/2023   11:56 AM  Advanced Directives  Does Patient Have a Medical Advance Directive? Yes  Type of Estate agent of [place];Out of facility DNR (pink MOST or yellow form);Living will  Does patient want to make changes to medical advance directive? No - Patient declined  Copy of Healthcare Power of Attorney in Chart? Yes - validated most recent copy scanned in chart (See row information)  Pre-existing out of facility DNR order (yellow form or pink MOST form) Pink MOST form placed in chart (order not valid for inpatient use);Yellow form placed in chart (order not valid for inpatient use)     Chief Complaint  Patient presents with   Medical Management of Chronic Issues    Patient routine visit     HPI:  Pt is a 82 y.o. female seen today for medical management of  chronic diseases.     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 Street of back injections by Dr. Murray Hodgkins 2016 with good relief, history of cervical DD with possiblity of surgery by Dr. Dutch Quint.   GERD (gastroesophageal reflux disease)    IBS with moderate refluc seen on upper GI study form January 2011 Dr. Ewing Schlein   HTN (hypertension)    no meds   Hyperlipidemia    Hypothyroidism    IBS (irritable bowel syndrome)    Imbalance    Impingement syndrome of right shoulder 01/05/2013   Mass of left side of neck    Memory loss    Mood disorder (HCC)    MVP (mitral valve prolapse)    Hx of    Osteopenia    Peripheral edema    Right rotator cuff tear 01/05/2013   Sciatic pain    recurrent   Seasonal allergies    Stroke [facility])    Tension headache    TIA (transient ischemic attack)    Tremor    Past Surgical History:  Procedure Laterality Date   ANTERIOR CERVICAL DECOMP/DISCECTOMY FUSION  04/2015    Dr. Rise Mu, [facility]   CARDIAC CATHETERIZATION  2004   which revealed smooth and normal coronary arteries   CARPAL TUNNEL RELEASE  2000   lt  CATARACT EXTRACTION W/ INTRAOCULAR LENS  IMPLANT, BILATERAL  1/17   Dr. Delaney Meigs   COSMETIC SURGERY  2010   eyes-facial   DENTAL SURGERY  1960   LASIK     LOOP RECORDER INSERTION N/A 09/22/2017   Procedure: LOOP RECORDER INSERTION;  Surgeon: Regan Lemming, MD;  [facility]: [facility] INVASIVE CV LAB;  Service: Cardiovascular;  Laterality: N/A;   LUMBAR LAMINECTOMY/DECOMPRESSION MICRODISCECTOMY N/A 06/08/2021   Procedure: Laminectomy and Foraminotomy - L4-L5, L5-S1;  Surgeon: Julio Sicks, MD;  Location: [facility] OR;  Service: Neurosurgery;  Laterality: N/A;   SHOULDER ARTHROSCOPY  WITH ROTATOR CUFF REPAIR Right 01/05/2013   Procedure: SHOULDER ARTHROSCOPY WITH ARTHROSCOPIC  ROTATOR CUFF REPAIR, ACROMIOPLASTY, EXTENSIVE DEBRIDEMENT;  Surgeon: Eulas Post, MD;  Location: [facility];  Service: Orthopedics;  Laterality: Right;   TEE WITHOUT CARDIOVERSION N/A 09/22/2017   Procedure: TRANSESOPHAGEAL ECHOCARDIOGRAM (TEE);  Surgeon: Thurmon Fair, MD;  [facility];  Service: Cardiovascular;  Laterality: N/A;   TONSILLECTOMY     TUBAL LIGATION      Allergies  Allergen Reactions   Fluticasone Other (See Comments)    DRY EYES   Aciphex [Rabeprazole] Other (See Comments)    Critical    Codeine Nausea Only   Codeine Other (See Comments)    unknown   Cymbalta [Duloxetine Hcl] Other (See Comments)    Critical    Lexapro [Escitalopram] Other (See Comments)    critical   Lipitor [Atorvastatin Calcium] Other (See Comments)    Intolerant    Morphine Sulfate     Per matrix   Pneumovax [Pneumococcal Polysaccharide Vaccine] Other (See Comments)    moderate   Prevacid [Lansoprazole] Other (See Comments)    Unknown, listed on MAR   Rosuvastatin Other (See Comments)    Intolerant    Sertraline Other (See Comments)    critical   Statins Other (See Comments)    Critical    Sulfa Antibiotics Nausea And Vomiting   Tetracyclines & Related Other (See Comments)    Morphine Sulfate   Welchol [Colesevelam] Other (See Comments)    Unknown, listed on MAR   Pneumovax 23 [Pneumococcal Vac Polyvalent] Rash    Injection site reaction    Outpatient Encounter Medications as of 09/02/2023  Medication Sig   acetaminophen (TYLENOL) 325 MG tablet Take 650 mg by mouth every 6 (six) hours as needed.   clopidogrel (PLAVIX) 75 MG tablet Take 1 tablet (75 mg total) by mouth daily.   diclofenac Sodium (VOLTAREN) 1 % GEL Apply 2 g topically 2 (two) times daily as needed (pain).   gabapentin (NEURONTIN) 100 MG capsule Take 100 mg by mouth 2 (two) times daily.    levothyroxine (SYNTHROID) 75 MCG tablet Take 75 mcg by mouth daily before breakfast.   memantine (NAMENDA) 5 MG tablet Take 5 mg by mouth 2 (two) times daily.   omeprazole (PRILOSEC) 40 MG capsule Take 40 mg by mouth daily.   oxybutynin (DITROPAN) 5 MG tablet Take 1 tablet (5 mg total) by mouth daily.   polyethylene glycol (MIRALAX) 17 g packet Take 17 g by mouth daily.   propranolol (INDERAL) 20 MG tablet Take 20 mg by mouth 2 (two) times daily. 1 tablet (20 mg) oral, Twice A Day   [DISCONTINUED] levothyroxine (SYNTHROID) 88 MCG tablet Take 88 mcg by mouth daily before breakfast.   No facility-administered encounter medications on file as of 09/02/2023.    Review of Systems  Immunization History  Administered Date(s) Administered   [employer]  Quad(high Dose 65+) 07/02/2022   Influenza, High Dose Seasonal PF 07/23/2017, 07/03/2019   Influenza-Unspecified 05/27/2010, 08/03/2010, 07/22/2011, 07/01/2012, 06/22/2013, 07/18/2020, 07/11/2021, 07/02/2022, 07/19/2023   Janssen (J&J) SARS-COV-2 Vaccination 11/06/2019   Moderna Covid-19 Vaccine Bivalent Booster 41yrs & up 07/19/2023   Moderna SARS-COV2 Booster Vaccination 08/05/2021   Moderna Sars-Covid-2 Vaccination 08/07/2020   PFIZER(Purple Top)SARS-COV-2 Vaccination 10/09/2019, 08/07/2020   Pfizer Covid-19 Vaccine Bivalent Booster 59yrs & up 01/21/2023   Pneumococcal Conjugate-13 02/08/2015   Pneumococcal Polysaccharide-23 05/28/2006, 10/13/2011   Pneumococcal-Unspecified 10/17/2013   Td 03/29/2002   Tdap 09/27/2001, 04/25/2002, 08/18/2012, 10/17/2013   Zoster Recombinant(Shingrix) 05/23/2021, 07/23/2021   Zoster, Live 08/30/2006   Zoster, Unspecified 08/30/2006   Pertinent  Health Maintenance Due  Topic Date Due   INFLUENZA VACCINE  Completed   DEXA SCAN  Completed      08/07/2022   11:17 AM 08/07/2022   11:03 PM 08/08/2022    8:00 PM 08/09/2022    8:00 AM 08/17/2022   10:59 AM  Fall Risk  Falls in the past year?     1  Was there  an injury with Fall?     1  Fall Risk Category Calculator     3  Fall Risk Category (Retired)     High  (RETIRED) Patient Fall Risk Level High fall risk High fall risk High fall risk High fall risk High fall risk  Patient at Risk for Falls Due to     History of fall(s);Impaired balance/gait;Impaired mobility  Fall risk Follow up     Falls evaluation completed   Functional Status Survey:    Vitals:   09/02/23 1151  BP: 134/82  Pulse: 65  Resp: (!) 22  Temp: (!) 97.3 F (36.3 C)  TempSrc: Temporal  SpO2: 93%  Weight: 148 lb 6.4 oz (67.3 kg)  Height: 5\' 1"  (1.549 m)   Body mass index is 28.04 kg/m. Physical Exam  Labs reviewed: Recent Labs    12/22/22 1850 01/24/23 0000 01/25/23 0000  NA 138 136* 137  K 4.1 4.4 4.3  CL 103 100 100  CO2 25 25* 24*  GLUCOSE 108*  --   --   BUN 11 12 12   CREATININE 0.76 0.7 0.6  CALCIUM 8.6* 8.6* 8.6*   Recent Labs    12/22/22 1850 01/24/23 0000  AST 16 27  ALT 21 36*  ALKPHOS 109 167*  BILITOT 0.8  --   PROT 6.9  --   ALBUMIN 3.1* 3.7   Recent Labs    12/22/22 1850 01/24/23 0000  WBC 9.7 9.3  NEUTROABS  --  7.10  HGB 13.4 13.1  HCT 41.3 38  MCV 96.9  --   PLT 350 341   Lab Results  Component Value Date   TSH 0.57 01/25/2023   Lab Results  Component Value Date   HGBA1C 5.6 09/21/2017   Lab Results  Component Value Date   CHOL 174 03/23/2022   HDL 66 03/23/2022   LDLCALC 81 03/23/2022   TRIG 134 03/23/2022   CHOLHDL 2.1 09/21/2017    Significant Diagnostic Results in last 30 days:  No results found.  Assessment/Plan There are no diagnoses linked to this encounter.   Family/ staff Communication: ***  Labs/tests ordered:  ***

## 2023-09-03 ENCOUNTER — Encounter: Payer: Self-pay | Admitting: Adult Health

## 2023-09-07 DIAGNOSIS — I672 Cerebral atherosclerosis: Secondary | ICD-10-CM | POA: Diagnosis not present

## 2023-09-07 DIAGNOSIS — I10 Essential (primary) hypertension: Secondary | ICD-10-CM | POA: Diagnosis not present

## 2023-09-07 DIAGNOSIS — F0153 Vascular dementia, unspecified severity, with mood disturbance: Secondary | ICD-10-CM | POA: Diagnosis not present

## 2023-09-07 DIAGNOSIS — I69318 Other symptoms and signs involving cognitive functions following cerebral infarction: Secondary | ICD-10-CM | POA: Diagnosis not present

## 2023-09-07 DIAGNOSIS — R634 Abnormal weight loss: Secondary | ICD-10-CM | POA: Diagnosis not present

## 2023-09-07 DIAGNOSIS — F0154 Vascular dementia, unspecified severity, with anxiety: Secondary | ICD-10-CM | POA: Diagnosis not present

## 2023-09-09 DIAGNOSIS — I10 Essential (primary) hypertension: Secondary | ICD-10-CM | POA: Diagnosis not present

## 2023-09-09 DIAGNOSIS — I672 Cerebral atherosclerosis: Secondary | ICD-10-CM | POA: Diagnosis not present

## 2023-09-09 DIAGNOSIS — F0153 Vascular dementia, unspecified severity, with mood disturbance: Secondary | ICD-10-CM | POA: Diagnosis not present

## 2023-09-09 DIAGNOSIS — R634 Abnormal weight loss: Secondary | ICD-10-CM | POA: Diagnosis not present

## 2023-09-09 DIAGNOSIS — I69318 Other symptoms and signs involving cognitive functions following cerebral infarction: Secondary | ICD-10-CM | POA: Diagnosis not present

## 2023-09-09 DIAGNOSIS — F0154 Vascular dementia, unspecified severity, with anxiety: Secondary | ICD-10-CM | POA: Diagnosis not present

## 2023-09-16 DIAGNOSIS — I69318 Other symptoms and signs involving cognitive functions following cerebral infarction: Secondary | ICD-10-CM | POA: Diagnosis not present

## 2023-09-16 DIAGNOSIS — F0153 Vascular dementia, unspecified severity, with mood disturbance: Secondary | ICD-10-CM | POA: Diagnosis not present

## 2023-09-16 DIAGNOSIS — I10 Essential (primary) hypertension: Secondary | ICD-10-CM | POA: Diagnosis not present

## 2023-09-16 DIAGNOSIS — R634 Abnormal weight loss: Secondary | ICD-10-CM | POA: Diagnosis not present

## 2023-09-16 DIAGNOSIS — F0154 Vascular dementia, unspecified severity, with anxiety: Secondary | ICD-10-CM | POA: Diagnosis not present

## 2023-09-16 DIAGNOSIS — I672 Cerebral atherosclerosis: Secondary | ICD-10-CM | POA: Diagnosis not present

## 2023-09-18 DIAGNOSIS — I672 Cerebral atherosclerosis: Secondary | ICD-10-CM | POA: Diagnosis not present

## 2023-09-18 DIAGNOSIS — I69318 Other symptoms and signs involving cognitive functions following cerebral infarction: Secondary | ICD-10-CM | POA: Diagnosis not present

## 2023-09-18 DIAGNOSIS — F0154 Vascular dementia, unspecified severity, with anxiety: Secondary | ICD-10-CM | POA: Diagnosis not present

## 2023-09-18 DIAGNOSIS — I10 Essential (primary) hypertension: Secondary | ICD-10-CM | POA: Diagnosis not present

## 2023-09-18 DIAGNOSIS — R634 Abnormal weight loss: Secondary | ICD-10-CM | POA: Diagnosis not present

## 2023-09-18 DIAGNOSIS — F0153 Vascular dementia, unspecified severity, with mood disturbance: Secondary | ICD-10-CM | POA: Diagnosis not present

## 2023-09-23 DIAGNOSIS — I672 Cerebral atherosclerosis: Secondary | ICD-10-CM | POA: Diagnosis not present

## 2023-09-23 DIAGNOSIS — F0153 Vascular dementia, unspecified severity, with mood disturbance: Secondary | ICD-10-CM | POA: Diagnosis not present

## 2023-09-23 DIAGNOSIS — F0154 Vascular dementia, unspecified severity, with anxiety: Secondary | ICD-10-CM | POA: Diagnosis not present

## 2023-09-23 DIAGNOSIS — I10 Essential (primary) hypertension: Secondary | ICD-10-CM | POA: Diagnosis not present

## 2023-09-23 DIAGNOSIS — R634 Abnormal weight loss: Secondary | ICD-10-CM | POA: Diagnosis not present

## 2023-09-23 DIAGNOSIS — I69318 Other symptoms and signs involving cognitive functions following cerebral infarction: Secondary | ICD-10-CM | POA: Diagnosis not present

## 2023-09-28 DIAGNOSIS — S2243XD Multiple fractures of ribs, bilateral, subsequent encounter for fracture with routine healing: Secondary | ICD-10-CM | POA: Diagnosis not present

## 2023-09-28 DIAGNOSIS — I69318 Other symptoms and signs involving cognitive functions following cerebral infarction: Secondary | ICD-10-CM | POA: Diagnosis not present

## 2023-09-28 DIAGNOSIS — M48061 Spinal stenosis, lumbar region without neurogenic claudication: Secondary | ICD-10-CM | POA: Diagnosis not present

## 2023-09-28 DIAGNOSIS — E785 Hyperlipidemia, unspecified: Secondary | ICD-10-CM | POA: Diagnosis not present

## 2023-09-28 DIAGNOSIS — F0154 Vascular dementia, unspecified severity, with anxiety: Secondary | ICD-10-CM | POA: Diagnosis not present

## 2023-09-28 DIAGNOSIS — F0153 Vascular dementia, unspecified severity, with mood disturbance: Secondary | ICD-10-CM | POA: Diagnosis not present

## 2023-09-28 DIAGNOSIS — R634 Abnormal weight loss: Secondary | ICD-10-CM | POA: Diagnosis not present

## 2023-09-28 DIAGNOSIS — E039 Hypothyroidism, unspecified: Secondary | ICD-10-CM | POA: Diagnosis not present

## 2023-09-28 DIAGNOSIS — I672 Cerebral atherosclerosis: Secondary | ICD-10-CM | POA: Diagnosis not present

## 2023-09-28 DIAGNOSIS — I10 Essential (primary) hypertension: Secondary | ICD-10-CM | POA: Diagnosis not present

## 2023-09-28 DIAGNOSIS — K219 Gastro-esophageal reflux disease without esophagitis: Secondary | ICD-10-CM | POA: Diagnosis not present

## 2023-09-28 DIAGNOSIS — Z9181 History of falling: Secondary | ICD-10-CM | POA: Diagnosis not present

## 2023-09-29 ENCOUNTER — Non-Acute Institutional Stay (SKILLED_NURSING_FACILITY): Payer: Medicare Other | Admitting: Adult Health

## 2023-09-29 ENCOUNTER — Encounter: Payer: Self-pay | Admitting: Adult Health

## 2023-09-29 DIAGNOSIS — Z66 Do not resuscitate: Secondary | ICD-10-CM

## 2023-09-29 DIAGNOSIS — N3281 Overactive bladder: Secondary | ICD-10-CM | POA: Diagnosis not present

## 2023-09-29 DIAGNOSIS — M48062 Spinal stenosis, lumbar region with neurogenic claudication: Secondary | ICD-10-CM

## 2023-09-29 DIAGNOSIS — F419 Anxiety disorder, unspecified: Secondary | ICD-10-CM

## 2023-09-29 DIAGNOSIS — R251 Tremor, unspecified: Secondary | ICD-10-CM

## 2023-09-29 DIAGNOSIS — F0150 Vascular dementia without behavioral disturbance: Secondary | ICD-10-CM

## 2023-09-29 DIAGNOSIS — K5901 Slow transit constipation: Secondary | ICD-10-CM

## 2023-09-29 NOTE — Progress Notes (Signed)
 Location:  [facility] Room Number: 146 A Place of Service:  SNF [phone]) Hospice  Provider:  Tawni America, NP   Patient Care Team: Charlanne Fredia CROME, MD as PCP - General (Internal Medicine) Nahser, Aleene PARAS, MD as PCP - Cardiology (Cardiology) Rosalie Kitchens, MD as Consulting Physician (Gastroenterology) Onita Duos, MD as Consulting Physician (Neurology) Colon Shove, MD as Consulting Physician (Neurosurgery) Jordan, Amy, MD as Consulting Physician (Dermatology) Cleotilde Ronal RAMAN, MD as Consulting Physician (Gynecology) [place], Heather, OD (Optometry) Charlanne Fredia CROME, MD (Internal Medicine)  Extended Emergency Contact Information Primary Emergency Contact: Segers,Robert J Address: [address]          [place], [place] [address] [place] Home Phone: [phone] Work Phone: [phone] Mobile Phone: [phone] Relation: Spouse Secondary Emergency Contact: Rea Mickey Lamar PARAS  [place] Home Phone: [phone] Mobile Phone: [phone] Relation: Son  Code Status:  DNR Goals of care: Advanced Directive information    09/29/2023   10:47 AM  Advanced Directives  Does Patient Have a Medical Advance Directive? Yes  Type of Estate Agent of [place];Out of facility DNR (pink MOST or yellow form);Living will  Does patient want to make changes to medical advance directive? No - Patient declined  Copy of Healthcare Power of Attorney in Chart? Yes - validated most recent copy scanned in chart (See row information)  Pre-existing out of facility DNR order (yellow form or pink MOST form) Pink MOST form placed in chart (order not valid for inpatient use);Yellow form placed in chart (order not valid for inpatient use)     Chief Complaint  Patient presents with   Medical Management of Chronic Issues    Routine visit. Discuss need for additional covid boosters     HPI:  Pt is a 83 y.o. female seen today for  medical management of chronic diseases.    Enrolled in hospice care due to progressive dementia and chronic pain related issues. .Needs assistance with all ADLs. Able to communicate needs but needs cuing and prompting. She uses a stand lift, 2 person assist with transfers.   No new issues. Did have a fall on 1/1 which was unwitnessed. No acute injuries. Has a mat at her bedside.   Vascular dementia: Hx of TIA on plavix .  Off aricept  due to weight loss. No aggression, very pleasant. Has severe anxiety with a long term hx of this. Had some issues with tremors and lethargy, no longer on wellbutrin  and clonazepam . No reported mood related issues. MMSE 15/30 02/10/23.    Has chronic back and hip pain. March 2024 MRI of hips Tear of the left gluteus minimus tendon High grade parital near complete tear of the right gluteus minimus tendon insertion  Also has a hx of sciatica with prior surgery and injections.  Currently on neurontin  BID. No reports of pain.  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  Street of back injections by Dr. Letha 2016 with good relief, history of cervical DD with possiblity of surgery by Dr. Malcolm.   GERD (gastroesophageal reflux disease)    IBS with moderate refluc seen on upper GI study form January 2011 Dr. Rosalie   HTN (hypertension)    no meds   Hyperlipidemia    Hypothyroidism    IBS (irritable bowel syndrome)    Imbalance    Impingement syndrome  of right shoulder 01/05/2013   Mass of left side of neck    Memory loss    Mood disorder (HCC)    MVP (mitral valve prolapse)    Hx of    Osteopenia    Peripheral edema    Right rotator cuff tear 01/05/2013   Sciatic pain    recurrent   Seasonal allergies    Stroke [facility])    Tension headache    TIA (transient ischemic attack)    Tremor    Past Surgical History:  Procedure Laterality Date   ANTERIOR CERVICAL DECOMP/DISCECTOMY FUSION  04/2015    Dr. Samule, [facility]   CARDIAC CATHETERIZATION  2004   which revealed smooth and normal coronary arteries   CARPAL TUNNEL RELEASE  2000   lt   CATARACT EXTRACTION W/ INTRAOCULAR LENS  IMPLANT, BILATERAL  1/17   Dr. Meridee   COSMETIC SURGERY  2010   eyes-facial   DENTAL SURGERY  1960   LASIK     LOOP RECORDER INSERTION N/A 09/22/2017   Procedure: LOOP RECORDER INSERTION;  Surgeon: Inocencio Soyla Lunger, MD;  [facility];  Service: Cardiovascular;  Laterality: N/A;   LUMBAR LAMINECTOMY/DECOMPRESSION MICRODISCECTOMY N/A 06/08/2021   Procedure: Laminectomy and Foraminotomy - L4-L5, L5-S1;  Surgeon: Louis Shove, MD;  Location: [facility] OR;  Service: Neurosurgery;  Laterality: N/A;   SHOULDER ARTHROSCOPY WITH ROTATOR CUFF REPAIR Right 01/05/2013   Procedure: SHOULDER ARTHROSCOPY WITH ARTHROSCOPIC  ROTATOR CUFF REPAIR, ACROMIOPLASTY, EXTENSIVE DEBRIDEMENT;  Surgeon: Fonda SHAUNNA Olmsted, MD;  Location: [facility];  Service: Orthopedics;  Laterality: Right;   TEE WITHOUT CARDIOVERSION N/A 09/22/2017   Procedure: TRANSESOPHAGEAL ECHOCARDIOGRAM (TEE);  Surgeon: Francyne Headland, MD;  [facility]: [facility];  Service: Cardiovascular;  Laterality: N/A;   TONSILLECTOMY     TUBAL LIGATION      Allergies  Allergen Reactions   Fluticasone Other (See Comments)    DRY EYES   Aciphex [Rabeprazole] Other (See Comments)    Critical    Codeine Nausea Only   Codeine Other (See Comments)    unknown   Cymbalta [Duloxetine Hcl] Other  (See Comments)    Critical    Lexapro [Escitalopram] Other (See Comments)    critical   Lipitor [Atorvastatin  Calcium ] Other (See Comments)    Intolerant    Morphine  Sulfate     Per matrix   Pneumovax [Pneumococcal Polysaccharide Vaccine] Other (See Comments)    moderate   Prevacid [Lansoprazole] Other (See Comments)    Unknown, listed on MAR   Rosuvastatin  Other (See Comments)    Intolerant    Sertraline  Other (See Comments)    critical   Statins Other (See Comments)    Critical    Sulfa Antibiotics Nausea And Vomiting   Tetracyclines & Related Other (See Comments)    Morphine  Sulfate   Welchol [Colesevelam] Other (See Comments)    Unknown, listed on MAR   Pneumovax 23 [Pneumococcal Vac Polyvalent] Rash    Injection site reaction    Outpatient Encounter Medications as of 09/29/2023  Medication Sig   acetaminophen  (TYLENOL ) 325 MG tablet Take 650 mg by mouth every 6 (six) hours as needed.   clopidogrel  (PLAVIX ) 75 MG tablet Take 1 tablet (75 mg total) by mouth daily.   diclofenac Sodium (VOLTAREN) 1 % GEL Apply 2 g topically 2 (two) times daily as needed (pain).   gabapentin  (NEURONTIN ) 100 MG capsule Take 100 mg by mouth 2 (two) times daily.   levothyroxine  (SYNTHROID ) 75 MCG tablet Take 75 mcg by mouth daily before breakfast.   memantine (NAMENDA) 5 MG tablet Take 5 mg by mouth 2 (two) times daily.   omeprazole  (PRILOSEC) 40 MG capsule Take 40 mg by mouth daily.   oxybutynin  (DITROPAN ) 5 MG tablet Take 1 tablet (5 mg total) by mouth daily.   polyethylene glycol (MIRALAX ) 17 g packet Take 17 g by mouth daily.   propranolol  (INDERAL ) 20 MG tablet Take 20 mg by mouth 2 (two) times daily. 1 tablet (20 mg) oral, Twice A Day   No facility-administered encounter medications on file as of 09/29/2023.    Review of Systems  Constitutional:  Negative for activity change, appetite change, chills, diaphoresis, fatigue, fever and unexpected weight change.  HENT:  Negative for  congestion.   Respiratory:  Negative for cough, shortness of breath and wheezing.   Cardiovascular:  Negative for chest pain, palpitations and leg swelling.  Gastrointestinal:  Negative for abdominal distention, abdominal pain, constipation and diarrhea.  Genitourinary:  Negative for difficulty urinating and dysuria.  Musculoskeletal:  Positive for arthralgias and gait problem. Negative for back pain, joint swelling and myalgias.  Neurological:  Negative for dizziness, tremors, seizures, syncope, facial asymmetry, speech difficulty, weakness, light-headedness, numbness and headaches.  Psychiatric/Behavioral:  Positive for confusion. Negative for agitation and behavioral problems.     Immunization History  Administered Date(s) Administered   Fluad Quad(high Dose 65+) 07/02/2022   Influenza, High Dose Seasonal PF 07/23/2017, 07/03/2019   Influenza-Unspecified 05/27/2010, 08/03/2010, 07/22/2011, 07/01/2012, 06/22/2013, 07/18/2020, 07/11/2021, 07/02/2022, 07/19/2023   Janssen (J&J) SARS-COV-2 Vaccination 11/06/2019   Moderna Covid-19 Vaccine  Bivalent Booster 46yrs & up 07/19/2023   Moderna SARS-COV2 Booster Vaccination 08/05/2021   Moderna Sars-Covid-2 Vaccination 08/07/2020   PFIZER(Purple Top)SARS-COV-2 Vaccination 10/09/2019, 08/07/2020   Pfizer Covid-19 Vaccine Bivalent Booster 66yrs & up 01/21/2023   Pneumococcal Conjugate-13 02/08/2015   Pneumococcal Polysaccharide-23 05/28/2006, 10/13/2011   Pneumococcal-Unspecified 10/17/2013   Td 03/29/2002   Tdap 09/27/2001, 04/25/2002, 08/18/2012, 10/17/2013   Zoster Recombinant(Shingrix) 05/23/2021, 07/23/2021   Zoster, Live 08/30/2006   Zoster, Unspecified 08/30/2006   Pertinent  Health Maintenance Due  Topic Date Due   INFLUENZA VACCINE  Completed   DEXA SCAN  Completed      08/07/2022   11:17 AM 08/07/2022   11:03 PM 08/08/2022    8:00 PM 08/09/2022    8:00 AM 08/17/2022   10:59 AM  Fall Risk  Falls in the past year?     1  Was  there an injury with Fall?     1  Fall Risk Category Calculator     3  Fall Risk Category (Retired)     High  (RETIRED) Patient Fall Risk Level High fall risk High fall risk High fall risk High fall risk High fall risk  Patient at Risk for Falls Due to     History of fall(s);Impaired balance/gait;Impaired mobility  Fall risk Follow up     Falls evaluation completed   Functional Status Survey:    Vitals:   09/29/23 1046  BP: 133/68  Pulse: 64  Temp: 98.6 F (37 C)  SpO2: 95%  Weight: 148 lb 6.4 oz (67.3 kg)  Height: 5' 1 (1.549 m)   Body mass index is 28.04 kg/m. Wt Readings from Last 3 Encounters:  09/29/23 148 lb 6.4 oz (67.3 kg)  09/02/23 148 lb 6.4 oz (67.3 kg)  08/08/23 144 lb (65.3 kg)    Physical Exam Vitals and nursing note reviewed.  Constitutional:      General: She is not in acute distress.    Appearance: She is not diaphoretic.  HENT:     Head: Normocephalic and atraumatic.     Mouth/Throat:     Mouth: Mucous membranes are moist.     Pharynx: Oropharynx is clear.  Eyes:     Conjunctiva/sclera: Conjunctivae normal.     Pupils: Pupils are equal, round, and reactive to light.  Neck:     Vascular: No JVD.  Cardiovascular:     Rate and Rhythm: Normal rate and regular rhythm.     Heart sounds: No murmur heard. Pulmonary:     Effort: Pulmonary effort is normal. No respiratory distress.     Breath sounds: Normal breath sounds. No wheezing.  Abdominal:     General: Abdomen is flat. Bowel sounds are normal. There is no distension.     Palpations: Abdomen is soft.  Musculoskeletal:     Right lower leg: No edema.     Left lower leg: No edema.  Skin:    General: Skin is warm and dry.  Neurological:     General: No focal deficit present.     Mental Status: She is alert. Mental status is at baseline.  Psychiatric:        Mood and Affect: Mood normal.     Labs reviewed: Recent Labs    12/22/22 1850 01/24/23 0000 01/25/23 0000  NA 138 136* 137  K 4.1  4.4 4.3  CL 103 100 100  CO2 25 25* 24*  GLUCOSE 108*  --   --   BUN 11 12 12   CREATININE 0.76 0.7 0.6  CALCIUM  8.6* 8.6* 8.6*   Recent Labs    12/22/22 1850 01/24/23 0000  AST 16 27  ALT 21 36*  ALKPHOS 109 167*  BILITOT 0.8  --   PROT 6.9  --   ALBUMIN 3.1* 3.7   Recent Labs    12/22/22 1850 01/24/23 0000  WBC 9.7 9.3  NEUTROABS  --  7.10  HGB 13.4 13.1  HCT 41.3 38  MCV 96.9  --   PLT 350 341   Lab Results  Component Value Date   TSH 0.05 (A) 08/18/2023   Lab Results  Component Value Date   HGBA1C 5.6 09/21/2017   Lab Results  Component Value Date   CHOL 174 03/23/2022   HDL 66 03/23/2022   LDLCALC 81 03/23/2022   TRIG 134 03/23/2022   CHOLHDL 2.1 09/21/2017    Significant Diagnostic Results in last 30 days:  No results found.  Assessment/Plan  1. DNR (do not resuscitate) (Primary) Order renewed  - Do not attempt resuscitation (DNR)  2. Vascular dementia without behavioral disturbance (HCC) Progressive decline in cognition and physical function c/w the disease. Continue supportive care in the skilled environment. Followed by hospice On plavix  due to prior CVA Continue Namenda.   3. Overactive bladder Continue ditropan   4. Tremors of nervous system Continue inderal    5. Lumbar stenosis with neurogenic claudication No current issues.  On neurontin  and tylenol  prn  6. Anxiety No current issues   7. Slow transit constipation Continue miralax      Family/ staff Communication: nurse/resident   Labs/tests ordered:  NA

## 2023-09-30 DIAGNOSIS — I69318 Other symptoms and signs involving cognitive functions following cerebral infarction: Secondary | ICD-10-CM | POA: Diagnosis not present

## 2023-09-30 DIAGNOSIS — I672 Cerebral atherosclerosis: Secondary | ICD-10-CM | POA: Diagnosis not present

## 2023-09-30 DIAGNOSIS — F0153 Vascular dementia, unspecified severity, with mood disturbance: Secondary | ICD-10-CM | POA: Diagnosis not present

## 2023-09-30 DIAGNOSIS — I10 Essential (primary) hypertension: Secondary | ICD-10-CM | POA: Diagnosis not present

## 2023-09-30 DIAGNOSIS — R634 Abnormal weight loss: Secondary | ICD-10-CM | POA: Diagnosis not present

## 2023-09-30 DIAGNOSIS — F0154 Vascular dementia, unspecified severity, with anxiety: Secondary | ICD-10-CM | POA: Diagnosis not present

## 2023-10-05 DIAGNOSIS — F0153 Vascular dementia, unspecified severity, with mood disturbance: Secondary | ICD-10-CM | POA: Diagnosis not present

## 2023-10-05 DIAGNOSIS — I69318 Other symptoms and signs involving cognitive functions following cerebral infarction: Secondary | ICD-10-CM | POA: Diagnosis not present

## 2023-10-05 DIAGNOSIS — I10 Essential (primary) hypertension: Secondary | ICD-10-CM | POA: Diagnosis not present

## 2023-10-05 DIAGNOSIS — I672 Cerebral atherosclerosis: Secondary | ICD-10-CM | POA: Diagnosis not present

## 2023-10-05 DIAGNOSIS — R634 Abnormal weight loss: Secondary | ICD-10-CM | POA: Diagnosis not present

## 2023-10-05 DIAGNOSIS — F0154 Vascular dementia, unspecified severity, with anxiety: Secondary | ICD-10-CM | POA: Diagnosis not present

## 2023-10-07 DIAGNOSIS — R634 Abnormal weight loss: Secondary | ICD-10-CM | POA: Diagnosis not present

## 2023-10-07 DIAGNOSIS — F0154 Vascular dementia, unspecified severity, with anxiety: Secondary | ICD-10-CM | POA: Diagnosis not present

## 2023-10-07 DIAGNOSIS — I10 Essential (primary) hypertension: Secondary | ICD-10-CM | POA: Diagnosis not present

## 2023-10-07 DIAGNOSIS — I69318 Other symptoms and signs involving cognitive functions following cerebral infarction: Secondary | ICD-10-CM | POA: Diagnosis not present

## 2023-10-07 DIAGNOSIS — I672 Cerebral atherosclerosis: Secondary | ICD-10-CM | POA: Diagnosis not present

## 2023-10-07 DIAGNOSIS — F0153 Vascular dementia, unspecified severity, with mood disturbance: Secondary | ICD-10-CM | POA: Diagnosis not present

## 2023-10-13 DIAGNOSIS — E039 Hypothyroidism, unspecified: Secondary | ICD-10-CM | POA: Diagnosis not present

## 2023-10-13 LAB — TSH: TSH: 0.76 (ref 0.41–5.90)

## 2023-10-14 DIAGNOSIS — F0154 Vascular dementia, unspecified severity, with anxiety: Secondary | ICD-10-CM | POA: Diagnosis not present

## 2023-10-14 DIAGNOSIS — I672 Cerebral atherosclerosis: Secondary | ICD-10-CM | POA: Diagnosis not present

## 2023-10-14 DIAGNOSIS — I10 Essential (primary) hypertension: Secondary | ICD-10-CM | POA: Diagnosis not present

## 2023-10-14 DIAGNOSIS — R634 Abnormal weight loss: Secondary | ICD-10-CM | POA: Diagnosis not present

## 2023-10-14 DIAGNOSIS — I69318 Other symptoms and signs involving cognitive functions following cerebral infarction: Secondary | ICD-10-CM | POA: Diagnosis not present

## 2023-10-14 DIAGNOSIS — F0153 Vascular dementia, unspecified severity, with mood disturbance: Secondary | ICD-10-CM | POA: Diagnosis not present

## 2023-10-17 ENCOUNTER — Encounter: Payer: Self-pay | Admitting: Internal Medicine

## 2023-10-17 ENCOUNTER — Non-Acute Institutional Stay (SKILLED_NURSING_FACILITY): Payer: Self-pay | Admitting: Internal Medicine

## 2023-10-17 DIAGNOSIS — R634 Abnormal weight loss: Secondary | ICD-10-CM | POA: Diagnosis not present

## 2023-10-17 DIAGNOSIS — F419 Anxiety disorder, unspecified: Secondary | ICD-10-CM

## 2023-10-17 DIAGNOSIS — I672 Cerebral atherosclerosis: Secondary | ICD-10-CM | POA: Diagnosis not present

## 2023-10-17 DIAGNOSIS — F0154 Vascular dementia, unspecified severity, with anxiety: Secondary | ICD-10-CM | POA: Diagnosis not present

## 2023-10-17 DIAGNOSIS — I69318 Other symptoms and signs involving cognitive functions following cerebral infarction: Secondary | ICD-10-CM | POA: Diagnosis not present

## 2023-10-17 DIAGNOSIS — F0153 Vascular dementia, unspecified severity, with mood disturbance: Secondary | ICD-10-CM | POA: Diagnosis not present

## 2023-10-17 DIAGNOSIS — F0150 Vascular dementia without behavioral disturbance: Secondary | ICD-10-CM

## 2023-10-17 DIAGNOSIS — I10 Essential (primary) hypertension: Secondary | ICD-10-CM | POA: Diagnosis not present

## 2023-10-17 MED ORDER — ALPRAZOLAM 0.25 MG PO TABS: 0.2500 mg | ORAL_TABLET | Freq: Two times a day (BID) | ORAL | 0 refills | Status: DC | PRN

## 2023-10-17 NOTE — Progress Notes (Signed)
Location: [facility] Room Number: 146A Place of Service:  SNF [id number])  Provider:   Code Status: DNR Bianca Garcia Goals of Care:     10/17/2023    3:56 PM  Advanced Directives  Does Patient Have a Medical Advance Directive? Yes  Type of Estate agent of Bianca Garcia;Out of facility DNR (pink MOST or yellow form);Living will  Does patient want to make changes to medical advance directive? No - Patient declined  Copy of Healthcare Power of Attorney in Chart? Yes - validated most recent copy scanned in chart (See row information)  Pre-existing out of facility DNR order (yellow form or pink MOST form) Pink MOST form placed in chart (order not valid for inpatient use);Yellow form placed in chart (order not valid for inpatient use)     Chief Complaint  Patient presents with   Acute Visit    Patient has some anxiety issues     HPI: Patient is a 83 y.o. female seen today for an acute visit for Anxiety  Lives in [place] in SNF   Patient has h/o  Vascular dementia Worsening Cognition Enrolled in hospice Chronic Pain in  neck and back pain Depression and Anxiety S/p CVA in 2018  Essential Tremor, Hypothyroidism, Hyperlipidemia,  S/P L4-5, L5-S1 decompressive laminectomy with foraminotomies due to Severe Back pain Done on 06/08/21  Enrolled in Hospice Nurses have noticed that patient some times get very anxious when she gets confused and then she starts screaming It takes long time to calm her Today she was at her baseline Smiling not anxious and had no issues   Past Medical History:  Diagnosis Date   Anxiety    Arthritis    Biceps tendonitis 01/05/2013   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 Street of back injections by Dr. Murray Hodgkins 2016 with good  relief, history of cervical DD with possiblity of surgery by Dr. Dutch Quint.   GERD (gastroesophageal reflux disease)    IBS with moderate refluc seen on upper GI study form January 2011 Dr. Ewing Schlein   HTN (hypertension)    no meds   Hyperlipidemia    Hypothyroidism    IBS (irritable bowel syndrome)    Imbalance    Impingement syndrome of right shoulder 01/05/2013   Mass of left side of neck    Memory loss    Mood disorder (HCC)    MVP (mitral valve prolapse)    Hx of    Osteopenia    Peripheral edema    Right rotator cuff tear 01/05/2013   Sciatic pain    recurrent   Seasonal allergies    Stroke [facility])    Tension headache    TIA (transient ischemic attack)    Tremor     Past Surgical History:  Procedure Laterality Date   ANTERIOR CERVICAL DECOMP/DISCECTOMY FUSION  04/2015    Dr. Rise Mu, [facility]   CARDIAC CATHETERIZATION  2004   which revealed smooth and normal coronary arteries   CARPAL TUNNEL RELEASE  2000   lt   CATARACT EXTRACTION W/ INTRAOCULAR LENS  IMPLANT, BILATERAL  1/17   Dr. Delaney Meigs   COSMETIC SURGERY  2010   eyes-facial   DENTAL SURGERY  1960   LASIK     LOOP RECORDER INSERTION N/A 09/22/2017  Procedure: LOOP RECORDER INSERTION;  Surgeon: Regan Lemming, MD;  [facility]: [facility] INVASIVE CV LAB;  Service: Cardiovascular;  Laterality: N/A;   LUMBAR LAMINECTOMY/DECOMPRESSION MICRODISCECTOMY N/A 06/08/2021   Procedure: Laminectomy and Foraminotomy - L4-L5, L5-S1;  Surgeon: Julio Sicks, MD;  Location: [facility] OR;  Service: Neurosurgery;  Laterality: N/A;   SHOULDER ARTHROSCOPY WITH ROTATOR CUFF REPAIR Right 01/05/2013   Procedure: SHOULDER ARTHROSCOPY WITH ARTHROSCOPIC  ROTATOR CUFF REPAIR, ACROMIOPLASTY, EXTENSIVE DEBRIDEMENT;  Surgeon: Eulas Post, MD;  Location: [facility];  Service: Orthopedics;  Laterality: Right;   TEE WITHOUT CARDIOVERSION N/A 09/22/2017   Procedure: TRANSESOPHAGEAL ECHOCARDIOGRAM (TEE);  Surgeon: Thurmon Fair, MD;  Location: [facility]  ENDOSCOPY;  Service: Cardiovascular;  Laterality: N/A;   TONSILLECTOMY     TUBAL LIGATION      Allergies  Allergen Reactions   Fluticasone Other (See Comments)    DRY EYES   Aciphex [Rabeprazole] Other (See Comments)    Critical    Codeine Nausea Only   Codeine Other (See Comments)    unknown   Cymbalta [Duloxetine Hcl] Other (See Comments)    Critical    Lexapro [Escitalopram] Other (See Comments)    critical   Lipitor [Atorvastatin Calcium] Other (See Comments)    Intolerant    Morphine Sulfate     Per matrix   Pneumovax [Pneumococcal Polysaccharide Vaccine] Other (See Comments)    moderate   Prevacid [Lansoprazole] Other (See Comments)    Unknown, listed on MAR   Rosuvastatin Other (See Comments)    Intolerant    Sertraline Other (See Comments)    critical   Statins Other (See Comments)    Critical    Sulfa Antibiotics Nausea And Vomiting   Tetracyclines & Related Other (See Comments)    Morphine Sulfate   Welchol [Colesevelam] Other (See Comments)    Unknown, listed on MAR   Pneumovax 23 [Pneumococcal Vac Polyvalent] Rash    Injection site reaction    Outpatient Encounter Medications as of 10/17/2023  Medication Sig   acetaminophen (TYLENOL) 325 MG tablet Take 650 mg by mouth every 6 (six) hours as needed.   clopidogrel (PLAVIX) 75 MG tablet Take 1 tablet (75 mg total) by mouth daily.   diclofenac Sodium (VOLTAREN) 1 % GEL Apply 2 g topically 2 (two) times daily as needed (pain).   gabapentin (NEURONTIN) 100 MG capsule Take 100 mg by mouth 2 (two) times daily.   levothyroxine (SYNTHROID) 75 MCG tablet Take 75 mcg by mouth daily before breakfast.   memantine (NAMENDA) 5 MG tablet Take 5 mg by mouth 2 (two) times daily.   omeprazole (PRILOSEC) 40 MG capsule Take 40 mg by mouth daily.   oxybutynin (DITROPAN) 5 MG tablet Take 1 tablet (5 mg total) by mouth daily.   polyethylene glycol (MIRALAX) 17 g packet Take 17 g by mouth daily.   propranolol (INDERAL) 20 MG  tablet Take 20 mg by mouth 2 (two) times daily. 1 tablet (20 mg) oral, Twice A Day   No facility-administered encounter medications on file as of 10/17/2023.    Review of Systems:  Review of Systems  Unable to perform ROS: Dementia    Health Maintenance  Topic Date Due   COVID-19 Vaccine (8 - 2024-25 season) 11/02/2023 (Originally 09/13/2023)   DTaP/Tdap/Td (6 - Td or Tdap) 10/18/2023   Pneumonia Vaccine 36+ Years old  Completed   INFLUENZA VACCINE  Completed   DEXA SCAN  Completed   Zoster Vaccines- Shingrix  Completed  HPV VACCINES  Aged Out    Physical Exam: Vitals:   10/17/23 1551  BP: (!) 140/85  Pulse: 60  Resp: 20  Temp: (!) 97.3 F (36.3 C)  TempSrc: Temporal  SpO2: 98%  Weight: 148 lb (67.1 kg)  Height: 5\' 1"  (1.549 m)   Body mass index is 27.96 kg/m. Physical Exam Vitals reviewed.  Constitutional:      Appearance: Normal appearance.  HENT:     Head: Normocephalic.     Nose: Nose normal.     Mouth/Throat:     Mouth: Mucous membranes are moist.     Pharynx: Oropharynx is clear.  Eyes:     Pupils: Pupils are equal, round, and reactive to light.  Cardiovascular:     Rate and Rhythm: Normal rate and regular rhythm.     Pulses: Normal pulses.     Heart sounds: Normal heart sounds. No murmur heard. Pulmonary:     Effort: Pulmonary effort is normal.     Breath sounds: Normal breath sounds.  Abdominal:     General: Abdomen is flat. Bowel sounds are normal.     Palpations: Abdomen is soft.  Musculoskeletal:        General: No swelling.     Cervical back: Neck supple.  Skin:    General: Skin is warm.  Neurological:     General: No focal deficit present.     Mental Status: She is alert.  Psychiatric:        Mood and Affect: Mood normal.        Thought Content: Thought content normal.     Labs reviewed: Basic Metabolic Panel: Recent Labs    12/22/22 1850 01/24/23 0000 01/25/23 0000 08/18/23 0000  NA 138 136* 137  --   K 4.1 4.4 4.3  --    CL 103 100 100  --   CO2 25 25* 24*  --   GLUCOSE 108*  --   --   --   BUN 11 12 12   --   CREATININE 0.76 0.7 0.6  --   CALCIUM 8.6* 8.6* 8.6*  --   TSH  --   --  0.57 0.05*   Liver Function Tests: Recent Labs    12/22/22 1850 01/24/23 0000  AST 16 27  ALT 21 36*  ALKPHOS 109 167*  BILITOT 0.8  --   PROT 6.9  --   ALBUMIN 3.1* 3.7   No results for input(s): "LIPASE", "AMYLASE" in the last 8760 hours. No results for input(s): "AMMONIA" in the last 8760 hours. CBC: Recent Labs    12/22/22 1850 01/24/23 0000  WBC 9.7 9.3  NEUTROABS  --  7.10  HGB 13.4 13.1  HCT 41.3 38  MCV 96.9  --   PLT 350 341   Lipid Panel: No results for input(s): "CHOL", "HDL", "LDLCALC", "TRIG", "CHOLHDL", "LDLDIRECT" in the last 8760 hours. Lab Results  Component Value Date   HGBA1C 5.6 09/21/2017    Procedures since last visit: No results found.  Assessment/Plan 1. Anxiety (Primary) Will start on Xanax 0.25 mg BID for anxiety PRn  2. Vascular dementia without behavioral disturbance (HCC) Continue SNF level care    Labs/tests ordered:  * No order type specified * Next appt:  Visit date not found

## 2023-10-21 DIAGNOSIS — R634 Abnormal weight loss: Secondary | ICD-10-CM | POA: Diagnosis not present

## 2023-10-21 DIAGNOSIS — F0154 Vascular dementia, unspecified severity, with anxiety: Secondary | ICD-10-CM | POA: Diagnosis not present

## 2023-10-21 DIAGNOSIS — I10 Essential (primary) hypertension: Secondary | ICD-10-CM | POA: Diagnosis not present

## 2023-10-21 DIAGNOSIS — I69318 Other symptoms and signs involving cognitive functions following cerebral infarction: Secondary | ICD-10-CM | POA: Diagnosis not present

## 2023-10-21 DIAGNOSIS — F0153 Vascular dementia, unspecified severity, with mood disturbance: Secondary | ICD-10-CM | POA: Diagnosis not present

## 2023-10-21 DIAGNOSIS — I672 Cerebral atherosclerosis: Secondary | ICD-10-CM | POA: Diagnosis not present

## 2023-10-26 DIAGNOSIS — F0153 Vascular dementia, unspecified severity, with mood disturbance: Secondary | ICD-10-CM | POA: Diagnosis not present

## 2023-10-26 DIAGNOSIS — I69318 Other symptoms and signs involving cognitive functions following cerebral infarction: Secondary | ICD-10-CM | POA: Diagnosis not present

## 2023-10-26 DIAGNOSIS — I10 Essential (primary) hypertension: Secondary | ICD-10-CM | POA: Diagnosis not present

## 2023-10-26 DIAGNOSIS — R634 Abnormal weight loss: Secondary | ICD-10-CM | POA: Diagnosis not present

## 2023-10-26 DIAGNOSIS — I672 Cerebral atherosclerosis: Secondary | ICD-10-CM | POA: Diagnosis not present

## 2023-10-26 DIAGNOSIS — F0154 Vascular dementia, unspecified severity, with anxiety: Secondary | ICD-10-CM | POA: Diagnosis not present

## 2023-10-28 DIAGNOSIS — I672 Cerebral atherosclerosis: Secondary | ICD-10-CM | POA: Diagnosis not present

## 2023-10-28 DIAGNOSIS — F0154 Vascular dementia, unspecified severity, with anxiety: Secondary | ICD-10-CM | POA: Diagnosis not present

## 2023-10-28 DIAGNOSIS — I69318 Other symptoms and signs involving cognitive functions following cerebral infarction: Secondary | ICD-10-CM | POA: Diagnosis not present

## 2023-10-28 DIAGNOSIS — F0153 Vascular dementia, unspecified severity, with mood disturbance: Secondary | ICD-10-CM | POA: Diagnosis not present

## 2023-10-28 DIAGNOSIS — I10 Essential (primary) hypertension: Secondary | ICD-10-CM | POA: Diagnosis not present

## 2023-10-28 DIAGNOSIS — R634 Abnormal weight loss: Secondary | ICD-10-CM | POA: Diagnosis not present

## 2023-10-29 DIAGNOSIS — E785 Hyperlipidemia, unspecified: Secondary | ICD-10-CM | POA: Diagnosis not present

## 2023-10-29 DIAGNOSIS — R634 Abnormal weight loss: Secondary | ICD-10-CM | POA: Diagnosis not present

## 2023-10-29 DIAGNOSIS — S2243XD Multiple fractures of ribs, bilateral, subsequent encounter for fracture with routine healing: Secondary | ICD-10-CM | POA: Diagnosis not present

## 2023-10-29 DIAGNOSIS — I672 Cerebral atherosclerosis: Secondary | ICD-10-CM | POA: Diagnosis not present

## 2023-10-29 DIAGNOSIS — I10 Essential (primary) hypertension: Secondary | ICD-10-CM | POA: Diagnosis not present

## 2023-10-29 DIAGNOSIS — M48061 Spinal stenosis, lumbar region without neurogenic claudication: Secondary | ICD-10-CM | POA: Diagnosis not present

## 2023-10-29 DIAGNOSIS — E039 Hypothyroidism, unspecified: Secondary | ICD-10-CM | POA: Diagnosis not present

## 2023-10-29 DIAGNOSIS — F0153 Vascular dementia, unspecified severity, with mood disturbance: Secondary | ICD-10-CM | POA: Diagnosis not present

## 2023-10-29 DIAGNOSIS — K219 Gastro-esophageal reflux disease without esophagitis: Secondary | ICD-10-CM | POA: Diagnosis not present

## 2023-10-29 DIAGNOSIS — F0154 Vascular dementia, unspecified severity, with anxiety: Secondary | ICD-10-CM | POA: Diagnosis not present

## 2023-10-29 DIAGNOSIS — Z9181 History of falling: Secondary | ICD-10-CM | POA: Diagnosis not present

## 2023-10-29 DIAGNOSIS — I69318 Other symptoms and signs involving cognitive functions following cerebral infarction: Secondary | ICD-10-CM | POA: Diagnosis not present

## 2023-11-02 DIAGNOSIS — R634 Abnormal weight loss: Secondary | ICD-10-CM | POA: Diagnosis not present

## 2023-11-02 DIAGNOSIS — F0153 Vascular dementia, unspecified severity, with mood disturbance: Secondary | ICD-10-CM | POA: Diagnosis not present

## 2023-11-02 DIAGNOSIS — F0154 Vascular dementia, unspecified severity, with anxiety: Secondary | ICD-10-CM | POA: Diagnosis not present

## 2023-11-02 DIAGNOSIS — I69318 Other symptoms and signs involving cognitive functions following cerebral infarction: Secondary | ICD-10-CM | POA: Diagnosis not present

## 2023-11-02 DIAGNOSIS — I10 Essential (primary) hypertension: Secondary | ICD-10-CM | POA: Diagnosis not present

## 2023-11-02 DIAGNOSIS — I672 Cerebral atherosclerosis: Secondary | ICD-10-CM | POA: Diagnosis not present

## 2023-11-03 ENCOUNTER — Non-Acute Institutional Stay (SKILLED_NURSING_FACILITY): Payer: Self-pay | Admitting: Adult Health

## 2023-11-03 DIAGNOSIS — M48062 Spinal stenosis, lumbar region with neurogenic claudication: Secondary | ICD-10-CM | POA: Diagnosis not present

## 2023-11-03 DIAGNOSIS — F0154 Vascular dementia, unspecified severity, with anxiety: Secondary | ICD-10-CM | POA: Diagnosis not present

## 2023-11-03 DIAGNOSIS — F0153 Vascular dementia, unspecified severity, with mood disturbance: Secondary | ICD-10-CM | POA: Diagnosis not present

## 2023-11-03 DIAGNOSIS — I10 Essential (primary) hypertension: Secondary | ICD-10-CM | POA: Diagnosis not present

## 2023-11-03 DIAGNOSIS — K5901 Slow transit constipation: Secondary | ICD-10-CM

## 2023-11-03 DIAGNOSIS — R251 Tremor, unspecified: Secondary | ICD-10-CM

## 2023-11-03 DIAGNOSIS — E039 Hypothyroidism, unspecified: Secondary | ICD-10-CM | POA: Diagnosis not present

## 2023-11-03 DIAGNOSIS — N3281 Overactive bladder: Secondary | ICD-10-CM | POA: Diagnosis not present

## 2023-11-03 DIAGNOSIS — F0150 Vascular dementia without behavioral disturbance: Secondary | ICD-10-CM | POA: Diagnosis not present

## 2023-11-03 DIAGNOSIS — R634 Abnormal weight loss: Secondary | ICD-10-CM | POA: Diagnosis not present

## 2023-11-03 DIAGNOSIS — I672 Cerebral atherosclerosis: Secondary | ICD-10-CM | POA: Diagnosis not present

## 2023-11-03 DIAGNOSIS — F419 Anxiety disorder, unspecified: Secondary | ICD-10-CM | POA: Diagnosis not present

## 2023-11-03 DIAGNOSIS — I69318 Other symptoms and signs involving cognitive functions following cerebral infarction: Secondary | ICD-10-CM | POA: Diagnosis not present

## 2023-11-04 ENCOUNTER — Encounter: Payer: Self-pay | Admitting: Adult Health

## 2023-11-04 NOTE — Progress Notes (Signed)
 Location:  [facility]  SNF (31) Hospice  Provider:  Tawni America, NP   Patient Care Team: Charlanne Fredia CROME, MD as PCP - General (Internal Medicine) Nahser, Aleene PARAS, MD as PCP - Cardiology (Cardiology) Rosalie Kitchens, MD as Consulting Physician (Gastroenterology) Onita Duos, MD as Consulting Physician (Neurology) Colon Shove, MD as Consulting Physician (Neurosurgery) Jordan, Amy, MD as Consulting Physician (Dermatology) Cleotilde Ronal RAMAN, MD as Consulting Physician (Gynecology) Oman, Heather, OD (Optometry) Charlanne Fredia CROME, MD (Internal Medicine)  Extended Emergency Contact Information Primary Emergency Contact: Pardini,Robert J Address: [address]          [place], [place] [address] [place] Home Phone: [phone] Work Phone: [phone] Mobile Phone: [phone] Relation: Spouse Secondary Emergency Contact: Rea Mickey Lamar PARAS  [place] Home Phone: [phone] Mobile Phone: [phone] Relation: Son  Code Status:  DNR Goals of care: Advanced Directive information    10/17/2023    3:56 PM  Advanced Directives  Does Patient Have a Medical Advance Directive? Yes  Type of Estate Agent of [place];Out of facility DNR (pink MOST or yellow form);Living will  Does patient want to make changes to medical advance directive? No - Patient declined  Copy of Healthcare Power of Attorney in Chart? Yes - validated most recent copy scanned in chart (See row information)  Pre-existing out of facility DNR order (yellow form or pink MOST form) Pink MOST form placed in chart (order not valid for inpatient use);Yellow form placed in chart (order not valid for inpatient use)     Chief Complaint  Patient presents with   Medical Management of Chronic Issues    HPI:  Pt is a 83 y.o. female seen today for medical management of chronic diseases.    Enrolled in hospice care due to progressive  dementia and chronic pain related issues. Needs assistance with all ADLs. Able to communicate needs but needs cuing and prompting.   Seen for increased anxiety and placed on prn xanax  which is effective per nursing notes. She does have a long hx of GAD and used to be on clonazepam .   Vascular dementia: Hx of TIA on plavix .  Off aricept  due to weight loss. No aggression, very pleasant. Had some issues with tremors and lethargy, no longer on wellbutrin  and clonazepam .MMSE 15/30 02/10/23.    Has chronic back and hip pain. Prior hx of laminectomy 2022. March 2024 MRI of hips Tear of the left gluteus minimus tendon High grade parital near complete tear of the right gluteus minimus tendon insertion  Also has a hx of sciatica with prior surgery and injections.  Currently on neurontin  BID. No reports of pain.  Hypothyroidism: levothyroxine  decreased to 75 mcg Lab Results  Component Value Date   TSH 0.05 (A) 08/18/2023  Repeat TSH 0.76 10/13/23   BP controlled   Tremor controlled with propranolol  OAB with incontinence on ditropan : no reports of issues.   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 Street of back injections by Dr. Letha 2016 with good relief, history of cervical DD with possiblity of surgery by Dr. Malcolm.   GERD (gastroesophageal reflux  disease)    IBS with moderate refluc seen on upper GI study form January 2011 Dr. Rosalie   HTN (hypertension)    no meds   Hyperlipidemia    Hypothyroidism    IBS (irritable bowel syndrome)    Imbalance    Impingement syndrome of right shoulder 01/05/2013   Mass of left side of neck    Memory loss    Mood disorder (HCC)    MVP (mitral valve prolapse)    Hx of     Osteopenia    Peripheral edema    Right rotator cuff tear 01/05/2013   Sciatic pain    recurrent   Seasonal allergies    Stroke [facility])    Tension headache    TIA (transient ischemic attack)    Tremor    Past Surgical History:  Procedure Laterality Date   ANTERIOR CERVICAL DECOMP/DISCECTOMY FUSION  04/2015    Dr. Samule, [facility]   CARDIAC CATHETERIZATION  2004   which revealed smooth and normal coronary arteries   CARPAL TUNNEL RELEASE  2000   lt   CATARACT EXTRACTION W/ INTRAOCULAR LENS  IMPLANT, BILATERAL  1/17   Dr. Meridee   COSMETIC SURGERY  2010   eyes-facial   DENTAL SURGERY  1960   LASIK     LOOP RECORDER INSERTION N/A 09/22/2017   Procedure: LOOP RECORDER INSERTION;  Surgeon: Inocencio Soyla Lunger, MD;  [facility] INVASIVE [facility] LAB;  Service: Cardiovascular;  Laterality: N/A;   LUMBAR LAMINECTOMY/DECOMPRESSION MICRODISCECTOMY N/A 06/08/2021   Procedure: Laminectomy and Foraminotomy - L4-L5, L5-S1;  Surgeon: Louis Shove, MD;  Location: [facility] OR;  Service: Neurosurgery;  Laterality: N/A;   SHOULDER ARTHROSCOPY WITH ROTATOR CUFF REPAIR Right 01/05/2013   Procedure: SHOULDER ARTHROSCOPY WITH ARTHROSCOPIC  ROTATOR CUFF REPAIR, ACROMIOPLASTY, EXTENSIVE DEBRIDEMENT;  Surgeon: Fonda SHAUNNA Olmsted, MD;  Location: [facility];  Service: Orthopedics;  Laterality: Right;   TEE WITHOUT CARDIOVERSION N/A 09/22/2017   Procedure: TRANSESOPHAGEAL ECHOCARDIOGRAM (TEE);  Surgeon: Francyne Headland, MD;  Location: [facility] ENDOSCOPY;  Service: Cardiovascular;  Laterality: N/A;   TONSILLECTOMY     TUBAL LIGATION      Allergies  Allergen Reactions   Fluticasone Other (See Comments)    DRY EYES   Aciphex [Rabeprazole] Other (See Comments)    Critical    Codeine Nausea Only   Codeine Other (See Comments)    unknown   Cymbalta [Duloxetine Hcl] Other (See Comments)    Critical    Lexapro [Escitalopram] Other (See Comments)    critical   Lipitor [Atorvastatin  Calcium ] Other (See  Comments)    Intolerant    Morphine  Sulfate     Per matrix   Pneumovax [Pneumococcal Polysaccharide Vaccine] Other (See Comments)    moderate   Prevacid [Lansoprazole] Other (See Comments)    Unknown, listed on MAR   Rosuvastatin  Other (See Comments)    Intolerant    Sertraline  Other (See Comments)    critical   Statins Other (See Comments)    Critical    Sulfa Antibiotics Nausea And Vomiting   Tetracyclines & Related Other (See Comments)    Morphine  Sulfate   Welchol [Colesevelam] Other (See Comments)    Unknown, listed on MAR   Pneumovax 23 [Pneumococcal Vac Polyvalent] Rash    Injection site reaction    Outpatient Encounter Medications as of 11/03/2023  Medication Sig   acetaminophen  (TYLENOL ) 325 MG tablet Take 650 mg by mouth every 6 (six) hours as needed.   ALPRAZolam  (XANAX ) 0.25 MG  tablet Take 1 tablet (0.25 mg total) by mouth 2 (two) times daily as needed for anxiety.   clopidogrel  (PLAVIX ) 75 MG tablet Take 1 tablet (75 mg total) by mouth daily.   diclofenac Sodium (VOLTAREN) 1 % GEL Apply 2 g topically 2 (two) times daily as needed (pain).   gabapentin  (NEURONTIN ) 100 MG capsule Take 100 mg by mouth 2 (two) times daily.   levothyroxine  (SYNTHROID ) 75 MCG tablet Take 75 mcg by mouth daily before breakfast.   memantine (NAMENDA) 5 MG tablet Take 5 mg by mouth 2 (two) times daily.   omeprazole  (PRILOSEC) 40 MG capsule Take 40 mg by mouth daily.   oxybutynin  (DITROPAN ) 5 MG tablet Take 1 tablet (5 mg total) by mouth daily.   polyethylene glycol (MIRALAX ) 17 g packet Take 17 g by mouth daily.   propranolol  (INDERAL ) 20 MG tablet Take 20 mg by mouth 2 (two) times daily. 1 tablet (20 mg) oral, Twice A Day   No facility-administered encounter medications on file as of 11/03/2023.    Review of Systems  Constitutional:  Negative for activity change, appetite change, chills, diaphoresis, fatigue, fever and unexpected weight change.  HENT:  Negative for congestion.    Respiratory:  Negative for cough, shortness of breath and wheezing.   Cardiovascular:  Negative for chest pain, palpitations and leg swelling.  Gastrointestinal:  Negative for abdominal distention, abdominal pain, constipation and diarrhea.  Genitourinary:  Negative for difficulty urinating and dysuria.  Musculoskeletal:  Positive for arthralgias and gait problem. Negative for back pain, joint swelling and myalgias.  Neurological:  Negative for dizziness, tremors, seizures, syncope, facial asymmetry, speech difficulty, weakness, light-headedness, numbness and headaches.  Psychiatric/Behavioral:  Positive for confusion. Negative for agitation and behavioral problems. The patient is nervous/anxious.     Immunization History  Administered Date(s) Administered   Fluad Quad(high Dose 65+) 07/02/2022   Influenza, High Dose Seasonal PF 07/23/2017, 07/03/2019   Influenza-Unspecified 05/27/2010, 08/03/2010, 07/22/2011, 07/01/2012, 06/22/2013, 07/18/2020, 07/11/2021, 07/02/2022, 07/19/2023   Janssen (J&J) SARS-COV-2 Vaccination 11/06/2019   Moderna Covid-19 Vaccine  Bivalent Booster 33yrs & up 07/19/2023   Moderna SARS-COV2 Booster Vaccination 08/05/2021   Moderna Sars-Covid-2 Vaccination 08/07/2020   PFIZER(Purple Top)SARS-COV-2 Vaccination 10/09/2019, 08/07/2020   Pfizer Covid-19 Vaccine Bivalent Booster 23yrs & up 01/21/2023   Pneumococcal Conjugate-13 02/08/2015   Pneumococcal Polysaccharide-23 05/28/2006, 10/13/2011   Pneumococcal-Unspecified 10/17/2013   Td 03/29/2002   Tdap 09/27/2001, 04/25/2002, 08/18/2012, 10/17/2013   Zoster Recombinant(Shingrix) 05/23/2021, 07/23/2021   Zoster, Live 08/30/2006   Zoster, Unspecified 08/30/2006   Pertinent  Health Maintenance Due  Topic Date Due   INFLUENZA VACCINE  Completed   DEXA SCAN  Completed      08/07/2022   11:17 AM 08/07/2022   11:03 PM 08/08/2022    8:00 PM 08/09/2022    8:00 AM 08/17/2022   10:59 AM  Fall Risk  Falls in the past  year?     1  Was there an injury with Fall?     1  Fall Risk Category Calculator     3  Fall Risk Category (Retired)     High  (RETIRED) Patient Fall Risk Level High fall risk High fall risk High fall risk High fall risk High fall risk  Patient at Risk for Falls Due to     History of fall(s);Impaired balance/gait;Impaired mobility  Fall risk Follow up     Falls evaluation completed   Functional Status Survey:    Vitals:   11/04/23 0833  BP: 136/81  Pulse: 64  Resp: 20  Temp: 97.9 F (36.6 C)  SpO2: 92%  Weight: 150 lb 6.4 oz (68.2 kg)   Body mass index is 28.42 kg/m. Wt Readings from Last 3 Encounters:  11/04/23 150 lb 6.4 oz (68.2 kg)  10/17/23 148 lb (67.1 kg)  09/29/23 148 lb 6.4 oz (67.3 kg)    Physical Exam Vitals and nursing note reviewed.  Constitutional:      General: She is not in acute distress.    Appearance: She is not diaphoretic.  HENT:     Head: Normocephalic and atraumatic.     Mouth/Throat:     Mouth: Mucous membranes are moist.     Pharynx: Oropharynx is clear.  Eyes:     Conjunctiva/sclera: Conjunctivae normal.     Pupils: Pupils are equal, round, and reactive to light.  Neck:     Vascular: No JVD.  Cardiovascular:     Rate and Rhythm: Normal rate and regular rhythm.     Heart sounds: No murmur heard. Pulmonary:     Effort: Pulmonary effort is normal. No respiratory distress.     Breath sounds: Normal breath sounds. No wheezing.  Abdominal:     General: Abdomen is flat. Bowel sounds are normal. There is no distension.     Palpations: Abdomen is soft.  Musculoskeletal:     Right lower leg: No edema.     Left lower leg: No edema.  Skin:    General: Skin is warm and dry.  Neurological:     General: No focal deficit present.     Mental Status: She is alert. Mental status is at baseline.  Psychiatric:        Mood and Affect: Mood normal.    Labs reviewed: Recent Labs    12/22/22 1850 01/24/23 0000 01/25/23 0000  NA 138 136* 137  K  4.1 4.4 4.3  CL 103 100 100  CO2 25 25* 24*  GLUCOSE 108*  --   --   BUN 11 12 12   CREATININE 0.76 0.7 0.6  CALCIUM  8.6* 8.6* 8.6*   Recent Labs    12/22/22 1850 01/24/23 0000  AST 16 27  ALT 21 36*  ALKPHOS 109 167*  BILITOT 0.8  --   PROT 6.9  --   ALBUMIN 3.1* 3.7   Recent Labs    12/22/22 1850 01/24/23 0000  WBC 9.7 9.3  NEUTROABS  --  7.10  HGB 13.4 13.1  HCT 41.3 38  MCV 96.9  --   PLT 350 341   Lab Results  Component Value Date   TSH 0.05 (A) 08/18/2023   Lab Results  Component Value Date   HGBA1C 5.6 09/21/2017   Lab Results  Component Value Date   CHOL 174 03/23/2022   HDL 66 03/23/2022   LDLCALC 81 03/23/2022   TRIG 134 03/23/2022   CHOLHDL 2.1 09/21/2017    Significant Diagnostic Results in last 30 days:  No results found.  Assessment/Plan  1. Vascular dementia without behavioral disturbance (HCC) Progressive decline in cognition and physical function c/w the disease. Continue supportive care in the skilled environment. Followed by hospice On plavix  due to prior CVA Continue Namenda.   2. Overactive bladder Continue ditropan   3. Tremors of nervous system Continue inderal    4. Lumbar stenosis with neurogenic claudication No current issues.  On neurontin  and tylenol  prn  5. Anxiety Improve with prn xanax   6. Slow transit constipation Continue miralax   7. Hypothyroidism Continue current dose of  levothyroxine     Family/ staff Communication: nurse/resident   Labs/tests ordered:  NA

## 2023-11-10 DIAGNOSIS — R634 Abnormal weight loss: Secondary | ICD-10-CM | POA: Diagnosis not present

## 2023-11-10 DIAGNOSIS — F0154 Vascular dementia, unspecified severity, with anxiety: Secondary | ICD-10-CM | POA: Diagnosis not present

## 2023-11-10 DIAGNOSIS — I69318 Other symptoms and signs involving cognitive functions following cerebral infarction: Secondary | ICD-10-CM | POA: Diagnosis not present

## 2023-11-10 DIAGNOSIS — I10 Essential (primary) hypertension: Secondary | ICD-10-CM | POA: Diagnosis not present

## 2023-11-10 DIAGNOSIS — F0153 Vascular dementia, unspecified severity, with mood disturbance: Secondary | ICD-10-CM | POA: Diagnosis not present

## 2023-11-10 DIAGNOSIS — I672 Cerebral atherosclerosis: Secondary | ICD-10-CM | POA: Diagnosis not present

## 2023-11-15 DIAGNOSIS — I10 Essential (primary) hypertension: Secondary | ICD-10-CM | POA: Diagnosis not present

## 2023-11-15 DIAGNOSIS — F0153 Vascular dementia, unspecified severity, with mood disturbance: Secondary | ICD-10-CM | POA: Diagnosis not present

## 2023-11-15 DIAGNOSIS — R634 Abnormal weight loss: Secondary | ICD-10-CM | POA: Diagnosis not present

## 2023-11-15 DIAGNOSIS — I672 Cerebral atherosclerosis: Secondary | ICD-10-CM | POA: Diagnosis not present

## 2023-11-15 DIAGNOSIS — I69318 Other symptoms and signs involving cognitive functions following cerebral infarction: Secondary | ICD-10-CM | POA: Diagnosis not present

## 2023-11-15 DIAGNOSIS — F0154 Vascular dementia, unspecified severity, with anxiety: Secondary | ICD-10-CM | POA: Diagnosis not present

## 2023-11-25 DIAGNOSIS — I672 Cerebral atherosclerosis: Secondary | ICD-10-CM | POA: Diagnosis not present

## 2023-11-25 DIAGNOSIS — R634 Abnormal weight loss: Secondary | ICD-10-CM | POA: Diagnosis not present

## 2023-11-25 DIAGNOSIS — F0154 Vascular dementia, unspecified severity, with anxiety: Secondary | ICD-10-CM | POA: Diagnosis not present

## 2023-11-25 DIAGNOSIS — I10 Essential (primary) hypertension: Secondary | ICD-10-CM | POA: Diagnosis not present

## 2023-11-25 DIAGNOSIS — I69318 Other symptoms and signs involving cognitive functions following cerebral infarction: Secondary | ICD-10-CM | POA: Diagnosis not present

## 2023-11-25 DIAGNOSIS — F0153 Vascular dementia, unspecified severity, with mood disturbance: Secondary | ICD-10-CM | POA: Diagnosis not present

## 2023-11-26 DIAGNOSIS — K219 Gastro-esophageal reflux disease without esophagitis: Secondary | ICD-10-CM | POA: Diagnosis not present

## 2023-11-26 DIAGNOSIS — F0154 Vascular dementia, unspecified severity, with anxiety: Secondary | ICD-10-CM | POA: Diagnosis not present

## 2023-11-26 DIAGNOSIS — I672 Cerebral atherosclerosis: Secondary | ICD-10-CM | POA: Diagnosis not present

## 2023-11-26 DIAGNOSIS — F0153 Vascular dementia, unspecified severity, with mood disturbance: Secondary | ICD-10-CM | POA: Diagnosis not present

## 2023-11-26 DIAGNOSIS — E785 Hyperlipidemia, unspecified: Secondary | ICD-10-CM | POA: Diagnosis not present

## 2023-11-26 DIAGNOSIS — Z9181 History of falling: Secondary | ICD-10-CM | POA: Diagnosis not present

## 2023-11-26 DIAGNOSIS — E039 Hypothyroidism, unspecified: Secondary | ICD-10-CM | POA: Diagnosis not present

## 2023-11-26 DIAGNOSIS — S2243XD Multiple fractures of ribs, bilateral, subsequent encounter for fracture with routine healing: Secondary | ICD-10-CM | POA: Diagnosis not present

## 2023-11-26 DIAGNOSIS — I10 Essential (primary) hypertension: Secondary | ICD-10-CM | POA: Diagnosis not present

## 2023-11-26 DIAGNOSIS — I69318 Other symptoms and signs involving cognitive functions following cerebral infarction: Secondary | ICD-10-CM | POA: Diagnosis not present

## 2023-11-26 DIAGNOSIS — R634 Abnormal weight loss: Secondary | ICD-10-CM | POA: Diagnosis not present

## 2023-11-26 DIAGNOSIS — M48061 Spinal stenosis, lumbar region without neurogenic claudication: Secondary | ICD-10-CM | POA: Diagnosis not present

## 2023-11-28 ENCOUNTER — Non-Acute Institutional Stay (SKILLED_NURSING_FACILITY): Payer: Self-pay | Admitting: Adult Health

## 2023-11-28 ENCOUNTER — Encounter: Payer: Self-pay | Admitting: Adult Health

## 2023-11-28 DIAGNOSIS — R251 Tremor, unspecified: Secondary | ICD-10-CM

## 2023-11-28 DIAGNOSIS — M48062 Spinal stenosis, lumbar region with neurogenic claudication: Secondary | ICD-10-CM | POA: Diagnosis not present

## 2023-11-28 DIAGNOSIS — F0150 Vascular dementia without behavioral disturbance: Secondary | ICD-10-CM

## 2023-11-28 DIAGNOSIS — N3281 Overactive bladder: Secondary | ICD-10-CM | POA: Diagnosis not present

## 2023-11-28 DIAGNOSIS — K5901 Slow transit constipation: Secondary | ICD-10-CM | POA: Diagnosis not present

## 2023-11-28 DIAGNOSIS — F419 Anxiety disorder, unspecified: Secondary | ICD-10-CM

## 2023-11-28 NOTE — Progress Notes (Unsigned)
 Location:  [facility] Room Number: 146A Place of Service:  SNF [phone]) Provider:  Fletcher Anon, NP  Mahlon Gammon, MD  Patient Care Team: Mahlon Gammon, MD as PCP - General (Internal Medicine) Nahser, Deloris Ping, MD as PCP - Cardiology (Cardiology) Vida Rigger, MD as Consulting Physician (Gastroenterology) Levert Feinstein, MD as Consulting Physician (Neurology) Barnett Abu, MD as Consulting Physician (Neurosurgery) [place], Amy, MD as Consulting Physician (Dermatology) Jerene Bears, MD as Consulting Physician (Gynecology) [place], Heather, OD (Optometry) Mahlon Gammon, MD (Internal Medicine)  Extended Emergency Contact Information Primary Emergency Contact: Wilmeth,Robert J Address: [address]          [place], [place] [address] [place] of [place] Home Phone: [phone] Work Phone: [phone] Mobile Phone: [phone] Relation: Spouse Secondary Emergency Contact: [place] of [place] Home Phone: [phone] Mobile Phone: [phone] Relation: Son  Code Status:  DNR hospice Goals of care: Advanced Directive information    11/28/2023    9:15 AM  Advanced Directives  Does Patient Have a Medical Advance Directive? Yes  Type of Estate agent of [place];Living will;Out of facility DNR (pink MOST or yellow form)  Does patient want to make changes to medical advance directive? No - Patient declined  Copy of Healthcare Power of Attorney in Chart? Yes - validated most recent copy scanned in chart (See row information)     Chief Complaint  Patient presents with   Medical Management of Chronic Issues    Routine visit.  Patient is due for tdap.    HPI:  Pt is a 83 y.o. female seen today for medical management of chronic diseases.    Enrolled in hospice care due to progressive dementia and chronic pain related issues. Needs assistance with all ADLs. Able to communicate needs but needs  cuing and prompting.   Anxiety: has periods of anxiety and prn xanax ordered. Not used in the past 4 days   Vascular dementia: Hx of TIA on plavix.  Off aricept due to weight loss. No aggression, very pleasant. Had some issues with tremors and lethargy, no longer on wellbutrin and clonazepam.MMSE 13/30 08/13/23.    Has chronic back and hip pain. Prior hx of laminectomy 2022. March 2024 MRI of hips Tear of the left gluteus minimus tendon High grade parital near complete tear of the right gluteus minimus tendon insertion  Also has a hx of sciatica with prior surgery and injections.  Currently on neurontin BID. No reports of pain.  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 Street of back injections by Dr. Murray Hodgkins 2016 with good relief, history of cervical DD with possiblity of surgery by Dr. Dutch Quint.   GERD (gastroesophageal reflux disease)    IBS with moderate refluc seen on upper GI study form January 2011 Dr. Ewing Schlein   HTN (hypertension)    no meds   Hyperlipidemia    Hypothyroidism  IBS (irritable bowel syndrome)    Imbalance    Impingement syndrome of right shoulder 01/05/2013   Mass of left side of neck    Memory loss    Mood disorder (HCC)    MVP (mitral valve prolapse)    Hx of    Osteopenia    Peripheral edema    Right rotator cuff tear 01/05/2013   Sciatic pain    recurrent   Seasonal allergies    Stroke [facility])    Tension headache    TIA (transient ischemic attack)     Tremor    Past Surgical History:  Procedure Laterality Date   ANTERIOR CERVICAL DECOMP/DISCECTOMY FUSION  04/2015    Dr. Rise Mu, [facility]   CARDIAC CATHETERIZATION  2004   which revealed smooth and normal coronary arteries   CARPAL TUNNEL RELEASE  2000   lt   CATARACT EXTRACTION W/ INTRAOCULAR LENS  IMPLANT, BILATERAL  1/17   Dr. Delaney Meigs   COSMETIC SURGERY  2010   eyes-facial   DENTAL SURGERY  1960   LASIK     LOOP RECORDER INSERTION N/A 09/22/2017   Procedure: LOOP RECORDER INSERTION;  Surgeon: Regan Lemming, MD;  Location: [facility] INVASIVE CV LAB;  Service: Cardiovascular;  Laterality: N/A;   LUMBAR LAMINECTOMY/DECOMPRESSION MICRODISCECTOMY N/A 06/08/2021   Procedure: Laminectomy and Foraminotomy - L4-L5, L5-S1;  Surgeon: Julio Sicks, MD;  Location: [facility] OR;  Service: Neurosurgery;  Laterality: N/A;   SHOULDER ARTHROSCOPY WITH ROTATOR CUFF REPAIR Right 01/05/2013   Procedure: SHOULDER ARTHROSCOPY WITH ARTHROSCOPIC  ROTATOR CUFF REPAIR, ACROMIOPLASTY, EXTENSIVE DEBRIDEMENT;  Surgeon: Eulas Post, MD;  Location: [facility];  Service: Orthopedics;  Laterality: Right;   TEE WITHOUT CARDIOVERSION N/A 09/22/2017   Procedure: TRANSESOPHAGEAL ECHOCARDIOGRAM (TEE);  Surgeon: Thurmon Fair, MD;  Location: [facility]OS[facility];  Service: Cardiovascular;  Laterality: N/A;   TONSILLECTOMY     TUBAL LIGATION      Allergies  Allergen Reactions   Fluticasone Other (See Comments)    DRY EYES   Aciphex [Rabeprazole] Other (See Comments)    Critical    Codeine Nausea Only   Codeine Other (See Comments)    unknown   Cymbalta [Duloxetine Hcl] Other (See Comments)    Critical    Lexapro [Escitalopram] Other (See Comments)    critical   Lipitor [Atorvastatin Calcium] Other (See Comments)    Intolerant    Morphine Sulfate     Per matrix   Pneumovax [Pneumococcal Polysaccharide Vaccine] Other (See Comments)    moderate   Prevacid [Lansoprazole] Other (See Comments)    Unknown,  listed on MAR   Rosuvastatin Other (See Comments)    Intolerant    Sertraline Other (See Comments)    critical   Statins Other (See Comments)    Critical    Sulfa Antibiotics Nausea And Vomiting   Tetracyclines & Related Other (See Comments)    Morphine Sulfate   Welchol [Colesevelam] Other (See Comments)    Unknown, listed on MAR   Pneumovax 23 [Pneumococcal Vac Polyvalent] Rash    Injection site reaction    Outpatient Encounter Medications as of 11/28/2023  Medication Sig   acetaminophen (TYLENOL) 325 MG tablet Take 650 mg by mouth every 6 (six) hours as needed.   ALPRAZolam (XANAX) 0.25 MG tablet Take 1 tablet (0.25 mg total) by mouth 2 (two) times daily as needed for anxiety.   clopidogrel (PLAVIX) 75 MG tablet Take 1 tablet (75 mg total) by mouth daily.   diclofenac Sodium (  VOLTAREN) 1 % GEL Apply 2 g topically 2 (two) times daily as needed (pain).   gabapentin (NEURONTIN) 100 MG capsule Take 100 mg by mouth 2 (two) times daily.   levothyroxine (SYNTHROID) 75 MCG tablet Take 75 mcg by mouth daily before breakfast.   memantine (NAMENDA) 5 MG tablet Take 5 mg by mouth 2 (two) times daily.   omeprazole (PRILOSEC) 40 MG capsule Take 40 mg by mouth daily.   oxybutynin (DITROPAN) 5 MG tablet Take 1 tablet (5 mg total) by mouth daily.   polyethylene glycol (MIRALAX) 17 g packet Take 17 g by mouth daily.   propranolol (INDERAL) 20 MG tablet Take 20 mg by mouth 2 (two) times daily. 1 tablet (20 mg) oral, Twice A Day   No facility-administered encounter medications on file as of 11/28/2023.    Review of Systems  Constitutional:  Negative for activity change, appetite change, chills, diaphoresis, fatigue, fever and unexpected weight change.  HENT:  Negative for congestion.   Respiratory:  Negative for cough, shortness of breath and wheezing.   Cardiovascular:  Negative for chest pain, palpitations and leg swelling.  Gastrointestinal:  Negative for abdominal distention, abdominal pain,  constipation and diarrhea.  Genitourinary:  Negative for difficulty urinating and dysuria.  Musculoskeletal:  Positive for gait problem. Negative for arthralgias, back pain, joint swelling and myalgias.  Neurological:  Negative for dizziness, tremors, seizures, syncope, facial asymmetry, speech difficulty, weakness, light-headedness, numbness and headaches.  Psychiatric/Behavioral:  Positive for confusion. Negative for agitation and behavioral problems. The patient is nervous/anxious.     Immunization History  Administered Date(s) Administered   Fluad Quad(high Dose 65+) 07/02/2022   Fluad Trivalent(High Dose 65+) 07/19/2023   Influenza, High Dose Seasonal PF 07/23/2017, 07/03/2019   Influenza-Unspecified 05/27/2010, 08/03/2010, 07/22/2011, 07/01/2012, 06/22/2013, 07/18/2020, 07/11/2021, 07/02/2022, 07/19/2023   Janssen (J&J) SARS-COV-2 Vaccination 11/06/2019   Moderna Covid-19 Vaccine Bivalent Booster 72yrs & up 07/19/2023   Moderna SARS-COV2 Booster Vaccination 08/05/2021   Moderna Sars-Covid-2 Vaccination 08/07/2020   PFIZER(Purple Top)SARS-COV-2 Vaccination 10/09/2019, 08/07/2020   Pfizer Covid-19 Vaccine Bivalent Booster 64yrs & up 01/21/2023   Pneumococcal Conjugate-13 02/08/2015   Pneumococcal Polysaccharide-23 05/28/2006, 10/13/2011   Pneumococcal-Unspecified 10/17/2013   Td 03/29/2002   Tdap 09/27/2001, 04/25/2002, 08/18/2012, 10/17/2013   Zoster Recombinant(Shingrix) 05/23/2021, 07/23/2021   Zoster, Live 08/30/2006   Zoster, Unspecified 08/30/2006   Pertinent  Health Maintenance Due  Topic Date Due   INFLUENZA VACCINE  Completed   DEXA SCAN  Completed      08/07/2022   11:17 AM 08/07/2022   11:03 PM 08/08/2022    8:00 PM 08/09/2022    8:00 AM 08/17/2022   10:59 AM  Fall Risk  Falls in the past year?     1  Was there an injury with Fall?     1  Fall Risk Category Calculator     3  Fall Risk Category (Retired)     High  (RETIRED) Patient Fall Risk Level High fall  risk High fall risk High fall risk High fall risk High fall risk  Patient at Risk for Falls Due to     History of fall(s);Impaired balance/gait;Impaired mobility  Fall risk Follow up     Falls evaluation completed   Functional Status Survey:    Vitals:   11/28/23 0903  BP: (!) 147/77  Pulse: 66  Resp: (!) 21  Temp: 98 F (36.7 C)  SpO2: 98%  Weight: 150 lb 6.4 oz (68.2 kg)  Height: 5\' 1"  (1.549 m)  Body mass index is 28.42 kg/m. Physical Exam Vitals and nursing note reviewed.  Constitutional:      General: She is not in acute distress.    Appearance: She is not diaphoretic.  HENT:     Head: Normocephalic and atraumatic.  Neck:     Vascular: No JVD.  Cardiovascular:     Rate and Rhythm: Normal rate and regular rhythm.     Heart sounds: No murmur heard. Pulmonary:     Effort: Pulmonary effort is normal. No respiratory distress.     Breath sounds: Normal breath sounds. No wheezing.  Abdominal:     General: Bowel sounds are normal. There is no distension.     Palpations: Abdomen is soft.     Tenderness: There is no abdominal tenderness.  Musculoskeletal:     Right lower leg: No edema.     Left lower leg: No edema.  Skin:    General: Skin is warm and dry.  Neurological:     General: No focal deficit present.     Mental Status: She is alert. Mental status is at baseline.  Psychiatric:        Mood and Affect: Mood normal.     Labs reviewed: Recent Labs    12/22/22 1850 01/24/23 0000 01/25/23 0000  NA 138 136* 137  K 4.1 4.4 4.3  CL 103 100 100  CO2 25 25* 24*  GLUCOSE 108*  --   --   BUN 11 12 12   CREATININE 0.76 0.7 0.6  CALCIUM 8.6* 8.6* 8.6*   Recent Labs    12/22/22 1850 01/24/23 0000  AST 16 27  ALT 21 36*  ALKPHOS 109 167*  BILITOT 0.8  --   PROT 6.9  --   ALBUMIN 3.1* 3.7   Recent Labs    12/22/22 1850 01/24/23 0000  WBC 9.7 9.3  NEUTROABS  --  7.10  HGB 13.4 13.1  HCT 41.3 38  MCV 96.9  --   PLT 350 341   Lab Results   Component Value Date   TSH 0.05 (A) 08/18/2023   Lab Results  Component Value Date   HGBA1C 5.6 09/21/2017   Lab Results  Component Value Date   CHOL 174 03/23/2022   HDL 66 03/23/2022   LDLCALC 81 03/23/2022   TRIG 134 03/23/2022   CHOLHDL 2.1 09/21/2017    Significant Diagnostic Results in last 30 days:  No results found.  Assessment/Plan  1. Vascular dementia without behavioral disturbance (HCC) Progressive decline in cognition and physical function c/w the disease. Continue supportive care in the skilled environment. Followed by hospice On plavix due to prior CVA Continue Namenda.   2. Overactive bladder Continue ditropan  3. Tremors of nervous system Continue inderal   4. Lumbar stenosis with neurogenic claudication No current issues.  On neurontin and tylenol prn  5. Anxiety Improve with prn xanax  6. Slow transit constipation Continue miralax  7. Hypothyroidism Continue current dose of levothyroxine  Family/ staff Communication: nurse  Labs/tests ordered:  NA

## 2023-11-29 ENCOUNTER — Encounter: Payer: Self-pay | Admitting: Adult Health

## 2023-11-30 DIAGNOSIS — F0154 Vascular dementia, unspecified severity, with anxiety: Secondary | ICD-10-CM | POA: Diagnosis not present

## 2023-11-30 DIAGNOSIS — I10 Essential (primary) hypertension: Secondary | ICD-10-CM | POA: Diagnosis not present

## 2023-11-30 DIAGNOSIS — I672 Cerebral atherosclerosis: Secondary | ICD-10-CM | POA: Diagnosis not present

## 2023-11-30 DIAGNOSIS — I69318 Other symptoms and signs involving cognitive functions following cerebral infarction: Secondary | ICD-10-CM | POA: Diagnosis not present

## 2023-11-30 DIAGNOSIS — F0153 Vascular dementia, unspecified severity, with mood disturbance: Secondary | ICD-10-CM | POA: Diagnosis not present

## 2023-11-30 DIAGNOSIS — R634 Abnormal weight loss: Secondary | ICD-10-CM | POA: Diagnosis not present

## 2023-12-01 DIAGNOSIS — F0154 Vascular dementia, unspecified severity, with anxiety: Secondary | ICD-10-CM | POA: Diagnosis not present

## 2023-12-01 DIAGNOSIS — R634 Abnormal weight loss: Secondary | ICD-10-CM | POA: Diagnosis not present

## 2023-12-01 DIAGNOSIS — I672 Cerebral atherosclerosis: Secondary | ICD-10-CM | POA: Diagnosis not present

## 2023-12-01 DIAGNOSIS — I10 Essential (primary) hypertension: Secondary | ICD-10-CM | POA: Diagnosis not present

## 2023-12-01 DIAGNOSIS — F0153 Vascular dementia, unspecified severity, with mood disturbance: Secondary | ICD-10-CM | POA: Diagnosis not present

## 2023-12-01 DIAGNOSIS — I69318 Other symptoms and signs involving cognitive functions following cerebral infarction: Secondary | ICD-10-CM | POA: Diagnosis not present

## 2023-12-08 DIAGNOSIS — F0154 Vascular dementia, unspecified severity, with anxiety: Secondary | ICD-10-CM | POA: Diagnosis not present

## 2023-12-08 DIAGNOSIS — I69318 Other symptoms and signs involving cognitive functions following cerebral infarction: Secondary | ICD-10-CM | POA: Diagnosis not present

## 2023-12-08 DIAGNOSIS — R634 Abnormal weight loss: Secondary | ICD-10-CM | POA: Diagnosis not present

## 2023-12-08 DIAGNOSIS — I10 Essential (primary) hypertension: Secondary | ICD-10-CM | POA: Diagnosis not present

## 2023-12-08 DIAGNOSIS — I672 Cerebral atherosclerosis: Secondary | ICD-10-CM | POA: Diagnosis not present

## 2023-12-08 DIAGNOSIS — F0153 Vascular dementia, unspecified severity, with mood disturbance: Secondary | ICD-10-CM | POA: Diagnosis not present

## 2023-12-15 DIAGNOSIS — I672 Cerebral atherosclerosis: Secondary | ICD-10-CM | POA: Diagnosis not present

## 2023-12-15 DIAGNOSIS — F0154 Vascular dementia, unspecified severity, with anxiety: Secondary | ICD-10-CM | POA: Diagnosis not present

## 2023-12-15 DIAGNOSIS — R634 Abnormal weight loss: Secondary | ICD-10-CM | POA: Diagnosis not present

## 2023-12-15 DIAGNOSIS — F0153 Vascular dementia, unspecified severity, with mood disturbance: Secondary | ICD-10-CM | POA: Diagnosis not present

## 2023-12-15 DIAGNOSIS — I10 Essential (primary) hypertension: Secondary | ICD-10-CM | POA: Diagnosis not present

## 2023-12-15 DIAGNOSIS — I69318 Other symptoms and signs involving cognitive functions following cerebral infarction: Secondary | ICD-10-CM | POA: Diagnosis not present

## 2023-12-21 DIAGNOSIS — F0153 Vascular dementia, unspecified severity, with mood disturbance: Secondary | ICD-10-CM | POA: Diagnosis not present

## 2023-12-21 DIAGNOSIS — I69318 Other symptoms and signs involving cognitive functions following cerebral infarction: Secondary | ICD-10-CM | POA: Diagnosis not present

## 2023-12-21 DIAGNOSIS — I672 Cerebral atherosclerosis: Secondary | ICD-10-CM | POA: Diagnosis not present

## 2023-12-21 DIAGNOSIS — R634 Abnormal weight loss: Secondary | ICD-10-CM | POA: Diagnosis not present

## 2023-12-21 DIAGNOSIS — F0154 Vascular dementia, unspecified severity, with anxiety: Secondary | ICD-10-CM | POA: Diagnosis not present

## 2023-12-21 DIAGNOSIS — I10 Essential (primary) hypertension: Secondary | ICD-10-CM | POA: Diagnosis not present

## 2023-12-26 DIAGNOSIS — F0154 Vascular dementia, unspecified severity, with anxiety: Secondary | ICD-10-CM | POA: Diagnosis not present

## 2023-12-26 DIAGNOSIS — I672 Cerebral atherosclerosis: Secondary | ICD-10-CM | POA: Diagnosis not present

## 2023-12-26 DIAGNOSIS — I10 Essential (primary) hypertension: Secondary | ICD-10-CM | POA: Diagnosis not present

## 2023-12-26 DIAGNOSIS — R634 Abnormal weight loss: Secondary | ICD-10-CM | POA: Diagnosis not present

## 2023-12-26 DIAGNOSIS — F0153 Vascular dementia, unspecified severity, with mood disturbance: Secondary | ICD-10-CM | POA: Diagnosis not present

## 2023-12-26 DIAGNOSIS — I69318 Other symptoms and signs involving cognitive functions following cerebral infarction: Secondary | ICD-10-CM | POA: Diagnosis not present

## 2023-12-27 DIAGNOSIS — R634 Abnormal weight loss: Secondary | ICD-10-CM | POA: Diagnosis not present

## 2023-12-27 DIAGNOSIS — S2243XD Multiple fractures of ribs, bilateral, subsequent encounter for fracture with routine healing: Secondary | ICD-10-CM | POA: Diagnosis not present

## 2023-12-27 DIAGNOSIS — Z9181 History of falling: Secondary | ICD-10-CM | POA: Diagnosis not present

## 2023-12-27 DIAGNOSIS — M48061 Spinal stenosis, lumbar region without neurogenic claudication: Secondary | ICD-10-CM | POA: Diagnosis not present

## 2023-12-27 DIAGNOSIS — F0154 Vascular dementia, unspecified severity, with anxiety: Secondary | ICD-10-CM | POA: Diagnosis not present

## 2023-12-27 DIAGNOSIS — F0153 Vascular dementia, unspecified severity, with mood disturbance: Secondary | ICD-10-CM | POA: Diagnosis not present

## 2023-12-27 DIAGNOSIS — I69318 Other symptoms and signs involving cognitive functions following cerebral infarction: Secondary | ICD-10-CM | POA: Diagnosis not present

## 2023-12-27 DIAGNOSIS — E039 Hypothyroidism, unspecified: Secondary | ICD-10-CM | POA: Diagnosis not present

## 2023-12-27 DIAGNOSIS — K219 Gastro-esophageal reflux disease without esophagitis: Secondary | ICD-10-CM | POA: Diagnosis not present

## 2023-12-27 DIAGNOSIS — I672 Cerebral atherosclerosis: Secondary | ICD-10-CM | POA: Diagnosis not present

## 2023-12-27 DIAGNOSIS — I10 Essential (primary) hypertension: Secondary | ICD-10-CM | POA: Diagnosis not present

## 2023-12-27 DIAGNOSIS — E785 Hyperlipidemia, unspecified: Secondary | ICD-10-CM | POA: Diagnosis not present

## 2023-12-28 DIAGNOSIS — R634 Abnormal weight loss: Secondary | ICD-10-CM | POA: Diagnosis not present

## 2023-12-28 DIAGNOSIS — I672 Cerebral atherosclerosis: Secondary | ICD-10-CM | POA: Diagnosis not present

## 2023-12-28 DIAGNOSIS — F0154 Vascular dementia, unspecified severity, with anxiety: Secondary | ICD-10-CM | POA: Diagnosis not present

## 2023-12-28 DIAGNOSIS — I10 Essential (primary) hypertension: Secondary | ICD-10-CM | POA: Diagnosis not present

## 2023-12-28 DIAGNOSIS — I69318 Other symptoms and signs involving cognitive functions following cerebral infarction: Secondary | ICD-10-CM | POA: Diagnosis not present

## 2023-12-28 DIAGNOSIS — F0153 Vascular dementia, unspecified severity, with mood disturbance: Secondary | ICD-10-CM | POA: Diagnosis not present

## 2023-12-29 DIAGNOSIS — I672 Cerebral atherosclerosis: Secondary | ICD-10-CM | POA: Diagnosis not present

## 2023-12-29 DIAGNOSIS — I69318 Other symptoms and signs involving cognitive functions following cerebral infarction: Secondary | ICD-10-CM | POA: Diagnosis not present

## 2023-12-29 DIAGNOSIS — I10 Essential (primary) hypertension: Secondary | ICD-10-CM | POA: Diagnosis not present

## 2023-12-29 DIAGNOSIS — R634 Abnormal weight loss: Secondary | ICD-10-CM | POA: Diagnosis not present

## 2023-12-29 DIAGNOSIS — F0154 Vascular dementia, unspecified severity, with anxiety: Secondary | ICD-10-CM | POA: Diagnosis not present

## 2023-12-29 DIAGNOSIS — F0153 Vascular dementia, unspecified severity, with mood disturbance: Secondary | ICD-10-CM | POA: Diagnosis not present

## 2024-01-02 ENCOUNTER — Non-Acute Institutional Stay (SKILLED_NURSING_FACILITY): Payer: Self-pay | Admitting: Internal Medicine

## 2024-01-02 DIAGNOSIS — R251 Tremor, unspecified: Secondary | ICD-10-CM | POA: Diagnosis not present

## 2024-01-02 DIAGNOSIS — F411 Generalized anxiety disorder: Secondary | ICD-10-CM

## 2024-01-02 DIAGNOSIS — E039 Hypothyroidism, unspecified: Secondary | ICD-10-CM | POA: Diagnosis not present

## 2024-01-02 DIAGNOSIS — N3281 Overactive bladder: Secondary | ICD-10-CM

## 2024-01-02 DIAGNOSIS — M48062 Spinal stenosis, lumbar region with neurogenic claudication: Secondary | ICD-10-CM | POA: Diagnosis not present

## 2024-01-02 DIAGNOSIS — F0150 Vascular dementia without behavioral disturbance: Secondary | ICD-10-CM

## 2024-01-02 DIAGNOSIS — K219 Gastro-esophageal reflux disease without esophagitis: Secondary | ICD-10-CM | POA: Diagnosis not present

## 2024-01-05 DIAGNOSIS — I69318 Other symptoms and signs involving cognitive functions following cerebral infarction: Secondary | ICD-10-CM | POA: Diagnosis not present

## 2024-01-05 DIAGNOSIS — F0153 Vascular dementia, unspecified severity, with mood disturbance: Secondary | ICD-10-CM | POA: Diagnosis not present

## 2024-01-05 DIAGNOSIS — F0154 Vascular dementia, unspecified severity, with anxiety: Secondary | ICD-10-CM | POA: Diagnosis not present

## 2024-01-05 DIAGNOSIS — R634 Abnormal weight loss: Secondary | ICD-10-CM | POA: Diagnosis not present

## 2024-01-05 DIAGNOSIS — I672 Cerebral atherosclerosis: Secondary | ICD-10-CM | POA: Diagnosis not present

## 2024-01-05 DIAGNOSIS — I10 Essential (primary) hypertension: Secondary | ICD-10-CM | POA: Diagnosis not present

## 2024-01-06 ENCOUNTER — Encounter: Payer: Self-pay | Admitting: Internal Medicine

## 2024-01-06 DIAGNOSIS — K219 Gastro-esophageal reflux disease without esophagitis: Secondary | ICD-10-CM | POA: Insufficient documentation

## 2024-01-06 DIAGNOSIS — F411 Generalized anxiety disorder: Secondary | ICD-10-CM | POA: Insufficient documentation

## 2024-01-06 NOTE — Progress Notes (Signed)
 Location:  Medical illustrator of Service:  SNF (31)  Provider:   Code Status: DNR/Hospice Goals of Care:     11/28/2023    9:15 AM  Advanced Directives  Does Patient Have a Medical Advance Directive? Yes  Type of Estate agent of [place];Living will;Out of facility DNR (pink MOST or yellow form)  Does patient want to make changes to medical advance directive? No - Patient declined  Copy of Healthcare Power of Attorney in Chart? Yes - validated most recent copy scanned in chart (See row information)     Chief Complaint  Patient presents with   Care Management    HPI: Patient is a 83 y.o. female seen today for medical management of chronic diseases.   Lives in [place] in SNF   Patient has h/o  Vascular dementia Worsening Cognition Enrolled in hospice Chronic Pain in  neck and back pain Depression and Anxiety S/p CVA in 2018  Essential Tremor, Hypothyroidism, Hyperlipidemia,  S/P L4-5, L5-S1 decompressive laminectomy with foraminotomies due to Severe Back pain Done on 06/08/21     She is stable. No new Nursing issues. 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 Street of back injections by Dr. Eben Golds 2016 with good relief, history of cervical DD with possiblity of surgery by Dr. Gwendlyn Lemmings.   GERD (gastroesophageal reflux disease)     IBS with moderate refluc seen on upper GI study form January 2011 Dr. Lavaughn Portland   HTN (hypertension)    no meds   Hyperlipidemia    Hypothyroidism    IBS (irritable bowel syndrome)    Imbalance    Impingement syndrome of right shoulder 01/05/2013   Mass of left side of neck    Memory loss    Mood disorder (HCC)    MVP (mitral valve prolapse)    Hx of    Osteopenia    Peripheral edema    Right rotator cuff tear 01/05/2013   Sciatic pain    recurrent   Seasonal allergies    Stroke [facility])    Tension headache    TIA (transient ischemic attack)    Tremor     Past Surgical History:  Procedure Laterality Date   ANTERIOR CERVICAL DECOMP/DISCECTOMY FUSION  04/2015    Dr. Hezekiah Louis, [facility]   CARDIAC CATHETERIZATION  2004   which revealed smooth and normal coronary arteries   CARPAL TUNNEL RELEASE  2000   lt   CATARACT EXTRACTION W/ INTRAOCULAR LENS  IMPLANT, BILATERAL  1/17   Dr. Kirkland Peppers   COSMETIC SURGERY  2010   eyes-facial   DENTAL SURGERY  1960   LASIK     LOOP RECORDER INSERTION N/A 09/22/2017   Procedure: LOOP RECORDER INSERTION;  Surgeon: Lei Pump, MD;  [facility];  Service: Cardiovascular;  Laterality: N/A;   LUMBAR LAMINECTOMY/DECOMPRESSION MICRODISCECTOMY N/A 06/08/2021   Procedure: Laminectomy and Foraminotomy - L4-L5, L5-S1;  Surgeon: Agustina Aldrich, MD;  Location: [facility] OR;  Service: Neurosurgery;  Laterality: N/A;   SHOULDER ARTHROSCOPY WITH ROTATOR CUFF REPAIR Right 01/05/2013   Procedure: SHOULDER ARTHROSCOPY WITH ARTHROSCOPIC  ROTATOR CUFF REPAIR, ACROMIOPLASTY, EXTENSIVE DEBRIDEMENT;  Surgeon: Neville Barbone, MD;  Location: [facility];  Service: Orthopedics;  Laterality: Right;   TEE WITHOUT CARDIOVERSION N/A 09/22/2017   Procedure: TRANSESOPHAGEAL ECHOCARDIOGRAM (TEE);  Surgeon: Luana Rumple, MD;  [facility]: [facility];  Service: Cardiovascular;  Laterality: N/A;   TONSILLECTOMY     TUBAL LIGATION      Allergies   Allergen Reactions   Fluticasone Other (See Comments)    DRY EYES   Aciphex [Rabeprazole] Other (See Comments)    Critical    Codeine Nausea Only   Codeine Other (See Comments)    unknown   Cymbalta [Duloxetine Hcl] Other (See Comments)    Critical    Lexapro [Escitalopram] Other (See Comments)    critical   Lipitor [Atorvastatin Calcium] Other (See Comments)    Intolerant    Morphine Sulfate     Per matrix   Pneumovax [Pneumococcal Polysaccharide Vaccine] Other (See Comments)    moderate   Prevacid [Lansoprazole] Other (See Comments)    Unknown, listed on MAR   Rosuvastatin Other (See Comments)    Intolerant    Sertraline Other (See Comments)    critical   Statins Other (See Comments)    Critical    Sulfa Antibiotics Nausea And Vomiting   Tetracyclines & Related Other (See Comments)    Morphine Sulfate   Welchol [Colesevelam] Other (See Comments)    Unknown, listed on MAR   Pneumovax 23 [Pneumococcal Vac Polyvalent] Rash    Injection site reaction    Outpatient Encounter Medications as of 01/02/2024  Medication Sig   aspirin 81 MG chewable tablet Chew 81 mg by mouth daily.   acetaminophen (TYLENOL) 325 MG tablet Take 650 mg by mouth every 6 (six) hours as needed.   ALPRAZolam (XANAX) 0.25 MG tablet Take 1 tablet (0.25 mg total) by mouth 2 (two) times daily as needed for anxiety.   diclofenac Sodium (VOLTAREN) 1 % GEL Apply 2 g topically 2 (two) times daily as needed (pain).   gabapentin (NEURONTIN) 100 MG capsule Take 100 mg by mouth 2 (two) times daily.   levothyroxine (SYNTHROID) 75 MCG tablet Take 75 mcg by mouth daily before breakfast.   memantine (NAMENDA) 5 MG tablet Take 5 mg by mouth 2 (two) times daily.   omeprazole (PRILOSEC) 40 MG capsule Take 40 mg by mouth daily.   oxybutynin (DITROPAN) 5 MG tablet Take 1 tablet (5 mg total) by mouth daily.   polyethylene glycol (MIRALAX) 17 g packet Take 17 g by mouth daily.   propranolol (INDERAL) 20 MG tablet Take 20  mg by mouth 2 (two) times daily. 1 tablet (20 mg) oral, Twice A Day   [DISCONTINUED] clopidogrel (PLAVIX) 75 MG tablet Take 1 tablet (75 mg total) by mouth daily.   No facility-administered encounter medications on file as of 01/02/2024.    Review of Systems:  Review of Systems  Health Maintenance  Topic Date Due   COVID-19 Vaccine (8 - 2024-25 season) 09/13/2023   DTaP/Tdap/Td (6 - Td or Tdap) 10/18/2023   INFLUENZA VACCINE  04/27/2024   Pneumonia Vaccine 46+ Years old  Completed   DEXA SCAN  Completed   Zoster Vaccines- Shingrix  Completed   HPV VACCINES  Aged Out   Meningococcal B Vaccine  Aged Out    Physical Exam: Vitals:   01/06/24 1034  BP: 128/64  Pulse: 72  Temp: 97.8 F (36.6 C)  Weight: 152 lb 3.2 oz (69 kg)   Body mass index is 28.76 kg/m. Physical Exam Vitals reviewed.  Constitutional:      Appearance: Normal appearance.  HENT:     Head: Normocephalic.     Nose: Nose normal.     Mouth/Throat:     Mouth: Mucous membranes are moist.     Pharynx: Oropharynx is clear.  Eyes:     Pupils: Pupils are equal, round, and reactive to light.  Cardiovascular:     Rate and Rhythm: Normal rate and regular rhythm.     Pulses: Normal pulses.     Heart sounds: Normal heart sounds. No murmur heard. Pulmonary:     Effort: Pulmonary effort is normal.     Breath sounds: Normal breath sounds.  Abdominal:     General: Abdomen is flat. Bowel sounds are normal.     Palpations: Abdomen is soft.  Musculoskeletal:        General: No swelling.     Cervical back: Neck supple.  Skin:    General: Skin is warm.  Neurological:     General: No focal deficit present.     Mental Status: She is alert.     Comments: Pleasantly confused Responds Appropriately Nervous  Psychiatric:        Mood and Affect: Mood normal.        Thought Content: Thought content normal.     Labs reviewed: Basic Metabolic Panel: Recent Labs    01/24/23 0000 01/25/23 0000 08/18/23 0000  10/13/23 0000  NA 136* 137  --   --   K 4.4 4.3  --   --   CL 100 100  --   --   CO2 25* 24*  --   --   BUN 12 12  --   --   CREATININE 0.7 0.6  --   --   CALCIUM 8.6* 8.6*  --   --   TSH  --  0.57 0.05* 0.76   Liver Function Tests: Recent Labs    01/24/23 0000  AST 27  ALT 36*  ALKPHOS 167*  ALBUMIN 3.7   No results for input(s): "LIPASE", "AMYLASE" in the last 8760 hours. No results for input(s): "AMMONIA" in the last 8760 hours. CBC: Recent Labs    01/24/23 0000  WBC 9.3  NEUTROABS 7.10  HGB 13.1  HCT 38  PLT 341   Lipid Panel: No results for input(s): "CHOL", "HDL", "LDLCALC", "TRIG", "CHOLHDL", "LDLDIRECT" in the last 8760 hours. Lab Results  Component Value Date   HGBA1C 5.6 09/21/2017    Procedures since last visit: No results found.  Assessment/Plan 1. Vascular dementia without behavioral disturbance (HCC) (Primary) Continues to need Skilled level of care Enrolled in Hospice Now on Aspirin per Hospice  2. Acquired hypothyroidism TSH normal in 09/2023  3. Generalized anxiety disorder On Depakote now and Xanax  4. Tremor Propanolol  5. Overactive bladder Ditropan  6. Lumbar stenosis with neurogenic claudication On Gabapentin  7. Gastroesophageal reflux disease without esophagitis Prilosec    Labs/tests ordered:  * No order type specified * Next appt:  Visit date not found

## 2024-01-12 DIAGNOSIS — F0153 Vascular dementia, unspecified severity, with mood disturbance: Secondary | ICD-10-CM | POA: Diagnosis not present

## 2024-01-12 DIAGNOSIS — R634 Abnormal weight loss: Secondary | ICD-10-CM | POA: Diagnosis not present

## 2024-01-12 DIAGNOSIS — I672 Cerebral atherosclerosis: Secondary | ICD-10-CM | POA: Diagnosis not present

## 2024-01-12 DIAGNOSIS — I10 Essential (primary) hypertension: Secondary | ICD-10-CM | POA: Diagnosis not present

## 2024-01-12 DIAGNOSIS — I69318 Other symptoms and signs involving cognitive functions following cerebral infarction: Secondary | ICD-10-CM | POA: Diagnosis not present

## 2024-01-12 DIAGNOSIS — F0154 Vascular dementia, unspecified severity, with anxiety: Secondary | ICD-10-CM | POA: Diagnosis not present

## 2024-01-19 DIAGNOSIS — I10 Essential (primary) hypertension: Secondary | ICD-10-CM | POA: Diagnosis not present

## 2024-01-19 DIAGNOSIS — R634 Abnormal weight loss: Secondary | ICD-10-CM | POA: Diagnosis not present

## 2024-01-19 DIAGNOSIS — I69318 Other symptoms and signs involving cognitive functions following cerebral infarction: Secondary | ICD-10-CM | POA: Diagnosis not present

## 2024-01-19 DIAGNOSIS — F0153 Vascular dementia, unspecified severity, with mood disturbance: Secondary | ICD-10-CM | POA: Diagnosis not present

## 2024-01-19 DIAGNOSIS — F0154 Vascular dementia, unspecified severity, with anxiety: Secondary | ICD-10-CM | POA: Diagnosis not present

## 2024-01-19 DIAGNOSIS — I672 Cerebral atherosclerosis: Secondary | ICD-10-CM | POA: Diagnosis not present

## 2024-01-24 DIAGNOSIS — F0153 Vascular dementia, unspecified severity, with mood disturbance: Secondary | ICD-10-CM | POA: Diagnosis not present

## 2024-01-24 DIAGNOSIS — I69318 Other symptoms and signs involving cognitive functions following cerebral infarction: Secondary | ICD-10-CM | POA: Diagnosis not present

## 2024-01-24 DIAGNOSIS — I672 Cerebral atherosclerosis: Secondary | ICD-10-CM | POA: Diagnosis not present

## 2024-01-24 DIAGNOSIS — F0154 Vascular dementia, unspecified severity, with anxiety: Secondary | ICD-10-CM | POA: Diagnosis not present

## 2024-01-24 DIAGNOSIS — R634 Abnormal weight loss: Secondary | ICD-10-CM | POA: Diagnosis not present

## 2024-01-24 DIAGNOSIS — I10 Essential (primary) hypertension: Secondary | ICD-10-CM | POA: Diagnosis not present

## 2024-01-26 DIAGNOSIS — F0153 Vascular dementia, unspecified severity, with mood disturbance: Secondary | ICD-10-CM | POA: Diagnosis not present

## 2024-01-26 DIAGNOSIS — F0154 Vascular dementia, unspecified severity, with anxiety: Secondary | ICD-10-CM | POA: Diagnosis not present

## 2024-01-26 DIAGNOSIS — I672 Cerebral atherosclerosis: Secondary | ICD-10-CM | POA: Diagnosis not present

## 2024-01-26 DIAGNOSIS — R634 Abnormal weight loss: Secondary | ICD-10-CM | POA: Diagnosis not present

## 2024-01-26 DIAGNOSIS — Z9181 History of falling: Secondary | ICD-10-CM | POA: Diagnosis not present

## 2024-01-26 DIAGNOSIS — M48061 Spinal stenosis, lumbar region without neurogenic claudication: Secondary | ICD-10-CM | POA: Diagnosis not present

## 2024-01-26 DIAGNOSIS — I10 Essential (primary) hypertension: Secondary | ICD-10-CM | POA: Diagnosis not present

## 2024-01-26 DIAGNOSIS — K219 Gastro-esophageal reflux disease without esophagitis: Secondary | ICD-10-CM | POA: Diagnosis not present

## 2024-01-26 DIAGNOSIS — E785 Hyperlipidemia, unspecified: Secondary | ICD-10-CM | POA: Diagnosis not present

## 2024-01-26 DIAGNOSIS — E039 Hypothyroidism, unspecified: Secondary | ICD-10-CM | POA: Diagnosis not present

## 2024-01-26 DIAGNOSIS — I69318 Other symptoms and signs involving cognitive functions following cerebral infarction: Secondary | ICD-10-CM | POA: Diagnosis not present

## 2024-01-26 DIAGNOSIS — S2243XD Multiple fractures of ribs, bilateral, subsequent encounter for fracture with routine healing: Secondary | ICD-10-CM | POA: Diagnosis not present

## 2024-02-02 ENCOUNTER — Non-Acute Institutional Stay (SKILLED_NURSING_FACILITY): Payer: Self-pay | Admitting: Adult Health

## 2024-02-02 DIAGNOSIS — F419 Anxiety disorder, unspecified: Secondary | ICD-10-CM | POA: Diagnosis not present

## 2024-02-02 DIAGNOSIS — E039 Hypothyroidism, unspecified: Secondary | ICD-10-CM

## 2024-02-02 DIAGNOSIS — M48062 Spinal stenosis, lumbar region with neurogenic claudication: Secondary | ICD-10-CM

## 2024-02-02 DIAGNOSIS — F0150 Vascular dementia without behavioral disturbance: Secondary | ICD-10-CM | POA: Diagnosis not present

## 2024-02-02 DIAGNOSIS — N3281 Overactive bladder: Secondary | ICD-10-CM

## 2024-02-02 DIAGNOSIS — I10 Essential (primary) hypertension: Secondary | ICD-10-CM | POA: Diagnosis not present

## 2024-02-02 DIAGNOSIS — R251 Tremor, unspecified: Secondary | ICD-10-CM

## 2024-02-02 DIAGNOSIS — R634 Abnormal weight loss: Secondary | ICD-10-CM | POA: Diagnosis not present

## 2024-02-02 DIAGNOSIS — I69318 Other symptoms and signs involving cognitive functions following cerebral infarction: Secondary | ICD-10-CM | POA: Diagnosis not present

## 2024-02-02 DIAGNOSIS — F411 Generalized anxiety disorder: Secondary | ICD-10-CM

## 2024-02-02 DIAGNOSIS — F0153 Vascular dementia, unspecified severity, with mood disturbance: Secondary | ICD-10-CM | POA: Diagnosis not present

## 2024-02-02 DIAGNOSIS — F0154 Vascular dementia, unspecified severity, with anxiety: Secondary | ICD-10-CM | POA: Diagnosis not present

## 2024-02-02 DIAGNOSIS — I672 Cerebral atherosclerosis: Secondary | ICD-10-CM | POA: Diagnosis not present

## 2024-02-03 ENCOUNTER — Encounter: Payer: Self-pay | Admitting: Adult Health

## 2024-02-03 NOTE — Progress Notes (Signed)
 Location:  Medical illustrator of Service:  [facility] ([facility]  Provider:  Raylene Calamity, NP  Marguerite Shiley, MD  Patient Care Team: Marguerite Shiley, MD as PCP - General (Internal Medicine) Nahser, Lela Purple, MD as PCP - Cardiology (Cardiology) Ozell Blunt, MD as Consulting Physician (Gastroenterology) Phebe Brasil, MD as Consulting Physician (Neurology) Elna Haggis, MD as Consulting Physician (Neurosurgery) [place], Amy, MD as Consulting Physician (Dermatology) Lillian Rein, MD as Consulting Physician (Gynecology) [place], Heather, [place] (Optometry) Marguerite Shiley, MD (Internal Medicine)  Extended Emergency Contact Information Primary Emergency Contact: Geppert,Robert J Address: [address]          [place], [place] [address] [place] Home Phone: [phone] Work Phone: [phone] Mobile Phone: [phone] Relation: Spouse Secondary Emergency Contact: Derwood Flor  [place] Home Phone: [phone] Mobile Phone: [phone] Relation: Son  Code Status:  DNR hospice Goals of care: Advanced Directive information    11/28/2023    9:15 AM  Advanced Directives  Does Patient Have a Medical Advance Directive? Yes  Type of Estate agent of [place];Living will;Out of facility DNR (pink MOST or yellow form)  Does patient want to make changes to medical advance directive? No - Patient declined  Copy of Healthcare Power of Attorney in Chart? Yes - validated most recent copy scanned in chart (See row information)     Chief Complaint  Patient presents with   Medical Management of Chronic Issues    HPI:  Pt is a 83 y.o. female seen today for medical management of chronic diseases.    Enrolled in hospice care due to progressive dementia and chronic pain related issues. Needs assistance with all ADLs. Able to communicate needs but needs cuing and prompting. Spends most of the time in her [facility]  Anxiety: has  periods of anxiety and prn xanax  ordered. Also started on depakote by hospice due to agitation in the afternoon hours. This seems to be helpful. Xanax  is consistently used in the evening and is effective.   Vascular dementia: Hx of TIA off plavix  per hospice, now on asa.   Off aricept  due to weight loss. MMSE 13/30 08/13/23.    Has chronic back and hip pain. Prior hx of laminectomy 2022. March 2024 MRI of hips Tear of the left gluteus minimus tendon High grade parital near complete tear of the right gluteus minimus tendon insertion  Also has a hx of sciatica with prior surgery and injections.  Currently on neurontin  BID. No reports of pain.  Hypothyroidism: levothyroxine  decreased to 75 mcg Lab Results  Component Value Date   TSH 0.76 10/13/2023    Constipatoion: bowels moving on miralax .   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 Street of back injections by Dr. Eben Golds 2016 with good relief, history of cervical DD with possiblity of surgery by Dr. Gwendlyn Lemmings.   GERD (gastroesophageal reflux disease)    IBS with moderate refluc seen on upper GI study form January 2011 Dr. Lavaughn Portland   HTN (hypertension)  no meds   Hyperlipidemia    Hypothyroidism    IBS (irritable bowel syndrome)    Imbalance    Impingement syndrome of right shoulder 01/05/2013   Mass of left side of neck    Memory loss    Mood disorder (HCC)    MVP (mitral valve prolapse)    Hx of    Osteopenia    Peripheral edema    Right rotator cuff tear 01/05/2013   Sciatic pain    recurrent   Seasonal allergies    Stroke [facility])     Tension headache    TIA (transient ischemic attack)    Tremor    Past Surgical History:  Procedure Laterality Date   ANTERIOR CERVICAL DECOMP/DISCECTOMY FUSION  04/2015    Dr. Hezekiah Louis, [facility]   CARDIAC CATHETERIZATION  2004   which revealed smooth and normal coronary arteries   CARPAL TUNNEL RELEASE  2000   lt   CATARACT EXTRACTION W/ INTRAOCULAR LENS  IMPLANT, BILATERAL  1/17   Dr. Kirkland Peppers   COSMETIC SURGERY  2010   eyes-facial   DENTAL SURGERY  1960   LASIK     LOOP RECORDER INSERTION N/A 09/22/2017   Procedure: LOOP RECORDER INSERTION;  Surgeon: Lei Pump, MD;  Location: [facility] INVASIVE CV LAB;  Service: Cardiovascular;  Laterality: N/A;   LUMBAR LAMINECTOMY/DECOMPRESSION MICRODISCECTOMY N/A 06/08/2021   Procedure: Laminectomy and Foraminotomy - L4-L5, L5-S1;  Surgeon: Agustina Aldrich, MD;  Location: [facility] OR;  Service: Neurosurgery;  Laterality: N/A;   SHOULDER ARTHROSCOPY WITH ROTATOR CUFF REPAIR Right 01/05/2013   Procedure: SHOULDER ARTHROSCOPY WITH ARTHROSCOPIC  ROTATOR CUFF REPAIR, ACROMIOPLASTY, EXTENSIVE DEBRIDEMENT;  Surgeon: Neville Barbone, MD;  Location: [facility];  Service: Orthopedics;  Laterality: Right;   TEE WITHOUT CARDIOVERSION N/A 09/22/2017   Procedure: TRANSESOPHAGEAL ECHOCARDIOGRAM (TEE);  Surgeon: Luana Rumple, MD;  Location: [facility] ENDOSCOPY;  Service: Cardiovascular;  Laterality: N/A;   TONSILLECTOMY     TUBAL LIGATION      Allergies  Allergen Reactions   Fluticasone Other (See Comments)    DRY EYES   Aciphex [Rabeprazole] Other (See Comments)    Critical    Codeine Nausea Only   Codeine Other (See Comments)    unknown   Cymbalta [Duloxetine Hcl] Other (See Comments)    Critical    Lexapro [Escitalopram] Other (See Comments)    critical   Lipitor [Atorvastatin  Calcium ] Other (See Comments)    Intolerant    Morphine  Sulfate     Per matrix   Pneumovax [Pneumococcal Polysaccharide Vaccine] Other (See Comments)    moderate    Prevacid [Lansoprazole] Other (See Comments)    Unknown, listed on MAR   Rosuvastatin  Other (See Comments)    Intolerant    Sertraline  Other (See Comments)    critical   Statins Other (See Comments)    Critical    Sulfa Antibiotics Nausea And Vomiting   Tetracyclines & Related Other (See Comments)    Morphine  Sulfate   Welchol [Colesevelam] Other (See Comments)    Unknown, listed on MAR   Pneumovax 23 [Pneumococcal Vac Polyvalent] Rash    Injection site reaction    Outpatient Encounter Medications as of 02/02/2024  Medication Sig   acetaminophen  (TYLENOL ) 325 MG tablet Take 650 mg by mouth every 6 (six) hours as needed.   ALPRAZolam  (XANAX ) 0.25 MG tablet Take 1 tablet (0.25 mg total) by mouth 2 (two) times daily as needed for anxiety.   aspirin  81 MG chewable tablet Chew  81 mg by mouth daily.   diclofenac Sodium (VOLTAREN) 1 % GEL Apply 2 g topically 2 (two) times daily as needed (pain).   divalproex (DEPAKOTE SPRINKLE) 125 MG capsule Take 125 mg by mouth at bedtime.   gabapentin  (NEURONTIN ) 100 MG capsule Take 100 mg by mouth 2 (two) times daily.   levothyroxine  (SYNTHROID ) 75 MCG tablet Take 75 mcg by mouth daily before breakfast.   memantine (NAMENDA) 5 MG tablet Take 5 mg by mouth 2 (two) times daily.   omeprazole  (PRILOSEC) 40 MG capsule Take 40 mg by mouth daily.   oxybutynin  (DITROPAN ) 5 MG tablet Take 1 tablet (5 mg total) by mouth daily.   polyethylene glycol (MIRALAX ) 17 g packet Take 17 g by mouth daily.   propranolol  (INDERAL ) 20 MG tablet Take 20 mg by mouth 2 (two) times daily. 1 tablet (20 mg) oral, Twice A Day   No facility-administered encounter medications on file as of 02/02/2024.    Review of Systems  Constitutional:  Negative for activity change, appetite change, chills, diaphoresis, fatigue, fever and unexpected weight change.  HENT:  Negative for congestion.   Respiratory:  Negative for cough, shortness of breath and wheezing.   Cardiovascular:  Negative  for chest pain, palpitations and leg swelling.  Gastrointestinal:  Negative for abdominal distention, abdominal pain, constipation and diarrhea.  Genitourinary:  Negative for difficulty urinating and dysuria.  Musculoskeletal:  Positive for gait problem. Negative for arthralgias, back pain, joint swelling and myalgias.  Neurological:  Negative for dizziness, tremors, seizures, syncope, facial asymmetry, speech difficulty, weakness, light-headedness, numbness and headaches.  Psychiatric/Behavioral:  Positive for confusion. Negative for agitation and behavioral problems. The patient is nervous/anxious.     Immunization History  Administered Date(s) Administered   Fluad Quad(high Dose 65+) 07/02/2022   Fluad Trivalent(High Dose 65+) 07/19/2023   Influenza, High Dose Seasonal PF 07/23/2017, 07/03/2019   Influenza-Unspecified 05/27/2010, 08/03/2010, 07/22/2011, 07/01/2012, 06/22/2013, 07/18/2020, 07/11/2021, 07/02/2022, 07/19/2023   Janssen (J&J) SARS-COV-2 Vaccination 11/06/2019   Moderna Covid-19 Vaccine  Bivalent Booster 36yrs & up 07/19/2023   Moderna SARS-COV2 Booster Vaccination 08/05/2021   Moderna Sars-Covid-2 Vaccination 08/07/2020   PFIZER(Purple Top)SARS-COV-2 Vaccination 10/09/2019, 08/07/2020   Pfizer Covid-19 Vaccine Bivalent Booster 75yrs & up 01/21/2023   Pneumococcal Conjugate-13 02/08/2015   Pneumococcal Polysaccharide-23 05/28/2006, 10/13/2011   Pneumococcal-Unspecified 10/17/2013   Td 03/29/2002   Tdap 09/27/2001, 04/25/2002, 08/18/2012, 10/17/2013   Zoster Recombinant(Shingrix) 05/23/2021, 07/23/2021   Zoster, Live 08/30/2006   Zoster, Unspecified 08/30/2006   Pertinent  Health Maintenance Due  Topic Date Due   INFLUENZA VACCINE  04/27/2024   DEXA SCAN  Completed      08/07/2022   11:17 AM 08/07/2022   11:03 PM 08/08/2022    8:00 PM 08/09/2022    8:00 AM 08/17/2022   10:59 AM  Fall Risk  Falls in the past year?     1  Was there an injury with Fall?     1   Fall Risk Category Calculator     3  Fall Risk Category (Retired)     High  (RETIRED) Patient Fall Risk Level High fall risk High fall risk High fall risk High fall risk High fall risk  Patient at Risk for Falls Due to     History of fall(s);Impaired balance/gait;Impaired mobility  Fall risk Follow up     Falls evaluation completed   Functional Status Survey:    Vitals:   02/03/24 1133  BP: 125/75  Pulse: 62  Resp: 14  Temp: (!) 97.5 F (36.4 C)  SpO2: 91%  Weight: 150 lb (68 kg)   Body mass index is 28.34 kg/m. Physical Exam Vitals and nursing note reviewed.  Constitutional:      General: She is not in acute distress.    Appearance: She is not diaphoretic.  HENT:     Head: Normocephalic and atraumatic.  Neck:     Vascular: No JVD.  Cardiovascular:     Rate and Rhythm: Normal rate and regular rhythm.     Heart sounds: No murmur heard. Pulmonary:     Effort: Pulmonary effort is normal. No respiratory distress.     Breath sounds: Normal breath sounds. No wheezing.  Abdominal:     General: Bowel sounds are normal. There is no distension.     Palpations: Abdomen is soft.     Tenderness: There is no abdominal tenderness.  Musculoskeletal:     Right lower leg: No edema.     Left lower leg: No edema.  Skin:    General: Skin is warm and dry.  Neurological:     General: No focal deficit present.     Mental Status: She is alert. Mental status is at baseline.  Psychiatric:        Mood and Affect: Mood normal.     Labs reviewed: No results for input(s): "NA", "K", "CL", "CO2", "GLUCOSE", "BUN", "CREATININE", "CALCIUM ", "MG", "PHOS" in the last 8760 hours.  No results for input(s): "AST", "ALT", "ALKPHOS", "BILITOT", "PROT", "ALBUMIN" in the last 8760 hours.  No results for input(s): "WBC", "NEUTROABS", "HGB", "HCT", "MCV", "PLT" in the last 8760 hours.  Lab Results  Component Value Date   TSH 0.76 10/13/2023   Lab Results  Component Value Date   HGBA1C 5.6  09/21/2017   Lab Results  Component Value Date   CHOL 174 03/23/2022   HDL 66 03/23/2022   LDLCALC 81 03/23/2022   TRIG 134 03/23/2022   CHOLHDL 2.1 09/21/2017    Significant Diagnostic Results in last 30 days:  No results found.  Assessment/Plan  1. Vascular dementia without behavioral disturbance (HCC) Progressive decline in cognition and physical function c/w the disease. Continue supportive care in the skilled environment. Followed by hospice Off plavix  per hospice, now on baby asa Continue Namenda.   2. Overactive bladder Continue ditropan   3. Tremors of nervous system Continue inderal    4. Lumbar stenosis with neurogenic claudication No current issues.  On neurontin  and tylenol  prn  5. Anxiety Improve with prn xanax  and scheduled depakote.   6. Slow transit constipation Continue miralax   7. Hypothyroidism Continue current dose of levothyroxine   Tdap ordered   Labs/tests ordered:  NA

## 2024-02-08 DIAGNOSIS — R634 Abnormal weight loss: Secondary | ICD-10-CM | POA: Diagnosis not present

## 2024-02-08 DIAGNOSIS — I672 Cerebral atherosclerosis: Secondary | ICD-10-CM | POA: Diagnosis not present

## 2024-02-08 DIAGNOSIS — I69318 Other symptoms and signs involving cognitive functions following cerebral infarction: Secondary | ICD-10-CM | POA: Diagnosis not present

## 2024-02-08 DIAGNOSIS — F0153 Vascular dementia, unspecified severity, with mood disturbance: Secondary | ICD-10-CM | POA: Diagnosis not present

## 2024-02-08 DIAGNOSIS — I10 Essential (primary) hypertension: Secondary | ICD-10-CM | POA: Diagnosis not present

## 2024-02-08 DIAGNOSIS — F0154 Vascular dementia, unspecified severity, with anxiety: Secondary | ICD-10-CM | POA: Diagnosis not present

## 2024-02-15 DIAGNOSIS — I672 Cerebral atherosclerosis: Secondary | ICD-10-CM | POA: Diagnosis not present

## 2024-02-15 DIAGNOSIS — F0154 Vascular dementia, unspecified severity, with anxiety: Secondary | ICD-10-CM | POA: Diagnosis not present

## 2024-02-15 DIAGNOSIS — F0153 Vascular dementia, unspecified severity, with mood disturbance: Secondary | ICD-10-CM | POA: Diagnosis not present

## 2024-02-15 DIAGNOSIS — I69318 Other symptoms and signs involving cognitive functions following cerebral infarction: Secondary | ICD-10-CM | POA: Diagnosis not present

## 2024-02-15 DIAGNOSIS — I10 Essential (primary) hypertension: Secondary | ICD-10-CM | POA: Diagnosis not present

## 2024-02-15 DIAGNOSIS — R634 Abnormal weight loss: Secondary | ICD-10-CM | POA: Diagnosis not present

## 2024-02-22 DIAGNOSIS — R634 Abnormal weight loss: Secondary | ICD-10-CM | POA: Diagnosis not present

## 2024-02-22 DIAGNOSIS — I10 Essential (primary) hypertension: Secondary | ICD-10-CM | POA: Diagnosis not present

## 2024-02-22 DIAGNOSIS — F0154 Vascular dementia, unspecified severity, with anxiety: Secondary | ICD-10-CM | POA: Diagnosis not present

## 2024-02-22 DIAGNOSIS — I672 Cerebral atherosclerosis: Secondary | ICD-10-CM | POA: Diagnosis not present

## 2024-02-22 DIAGNOSIS — I69318 Other symptoms and signs involving cognitive functions following cerebral infarction: Secondary | ICD-10-CM | POA: Diagnosis not present

## 2024-02-22 DIAGNOSIS — F0153 Vascular dementia, unspecified severity, with mood disturbance: Secondary | ICD-10-CM | POA: Diagnosis not present

## 2024-02-23 DIAGNOSIS — F0153 Vascular dementia, unspecified severity, with mood disturbance: Secondary | ICD-10-CM | POA: Diagnosis not present

## 2024-02-23 DIAGNOSIS — R634 Abnormal weight loss: Secondary | ICD-10-CM | POA: Diagnosis not present

## 2024-02-23 DIAGNOSIS — I69318 Other symptoms and signs involving cognitive functions following cerebral infarction: Secondary | ICD-10-CM | POA: Diagnosis not present

## 2024-02-23 DIAGNOSIS — F0154 Vascular dementia, unspecified severity, with anxiety: Secondary | ICD-10-CM | POA: Diagnosis not present

## 2024-02-23 DIAGNOSIS — I672 Cerebral atherosclerosis: Secondary | ICD-10-CM | POA: Diagnosis not present

## 2024-02-23 DIAGNOSIS — I10 Essential (primary) hypertension: Secondary | ICD-10-CM | POA: Diagnosis not present

## 2024-02-26 DIAGNOSIS — Z9181 History of falling: Secondary | ICD-10-CM | POA: Diagnosis not present

## 2024-02-26 DIAGNOSIS — F0153 Vascular dementia, unspecified severity, with mood disturbance: Secondary | ICD-10-CM | POA: Diagnosis not present

## 2024-02-26 DIAGNOSIS — F0154 Vascular dementia, unspecified severity, with anxiety: Secondary | ICD-10-CM | POA: Diagnosis not present

## 2024-02-26 DIAGNOSIS — E039 Hypothyroidism, unspecified: Secondary | ICD-10-CM | POA: Diagnosis not present

## 2024-02-26 DIAGNOSIS — R634 Abnormal weight loss: Secondary | ICD-10-CM | POA: Diagnosis not present

## 2024-02-26 DIAGNOSIS — M48061 Spinal stenosis, lumbar region without neurogenic claudication: Secondary | ICD-10-CM | POA: Diagnosis not present

## 2024-02-26 DIAGNOSIS — I672 Cerebral atherosclerosis: Secondary | ICD-10-CM | POA: Diagnosis not present

## 2024-02-26 DIAGNOSIS — I10 Essential (primary) hypertension: Secondary | ICD-10-CM | POA: Diagnosis not present

## 2024-02-26 DIAGNOSIS — I69318 Other symptoms and signs involving cognitive functions following cerebral infarction: Secondary | ICD-10-CM | POA: Diagnosis not present

## 2024-02-26 DIAGNOSIS — E785 Hyperlipidemia, unspecified: Secondary | ICD-10-CM | POA: Diagnosis not present

## 2024-02-26 DIAGNOSIS — S2243XD Multiple fractures of ribs, bilateral, subsequent encounter for fracture with routine healing: Secondary | ICD-10-CM | POA: Diagnosis not present

## 2024-02-26 DIAGNOSIS — K219 Gastro-esophageal reflux disease without esophagitis: Secondary | ICD-10-CM | POA: Diagnosis not present

## 2024-02-29 DIAGNOSIS — F0153 Vascular dementia, unspecified severity, with mood disturbance: Secondary | ICD-10-CM | POA: Diagnosis not present

## 2024-02-29 DIAGNOSIS — I672 Cerebral atherosclerosis: Secondary | ICD-10-CM | POA: Diagnosis not present

## 2024-02-29 DIAGNOSIS — I10 Essential (primary) hypertension: Secondary | ICD-10-CM | POA: Diagnosis not present

## 2024-02-29 DIAGNOSIS — I69318 Other symptoms and signs involving cognitive functions following cerebral infarction: Secondary | ICD-10-CM | POA: Diagnosis not present

## 2024-02-29 DIAGNOSIS — R634 Abnormal weight loss: Secondary | ICD-10-CM | POA: Diagnosis not present

## 2024-02-29 DIAGNOSIS — F0154 Vascular dementia, unspecified severity, with anxiety: Secondary | ICD-10-CM | POA: Diagnosis not present

## 2024-03-01 ENCOUNTER — Encounter: Payer: Self-pay | Admitting: Adult Health

## 2024-03-01 ENCOUNTER — Non-Acute Institutional Stay (SKILLED_NURSING_FACILITY): Payer: Self-pay | Admitting: Adult Health

## 2024-03-01 DIAGNOSIS — M48062 Spinal stenosis, lumbar region with neurogenic claudication: Secondary | ICD-10-CM

## 2024-03-01 DIAGNOSIS — N3281 Overactive bladder: Secondary | ICD-10-CM

## 2024-03-01 DIAGNOSIS — F411 Generalized anxiety disorder: Secondary | ICD-10-CM | POA: Diagnosis not present

## 2024-03-01 DIAGNOSIS — E039 Hypothyroidism, unspecified: Secondary | ICD-10-CM | POA: Diagnosis not present

## 2024-03-01 DIAGNOSIS — R251 Tremor, unspecified: Secondary | ICD-10-CM | POA: Diagnosis not present

## 2024-03-01 DIAGNOSIS — F0150 Vascular dementia without behavioral disturbance: Secondary | ICD-10-CM | POA: Diagnosis not present

## 2024-03-01 NOTE — Progress Notes (Signed)
 Location:  [facility] Room Number: 146 A Place of Service:  SNF [phone]) Provider:  Staci Carver,NP  Marguerite Shiley, MD  Patient Care Team: Marguerite Shiley, MD as PCP - General (Internal Medicine) Nahser, Lela Purple, MD as PCP - Cardiology (Cardiology) Ozell Blunt, MD as Consulting Physician (Gastroenterology) Phebe Brasil, MD as Consulting Physician (Neurology) Elna Haggis, MD as Consulting Physician (Neurosurgery) [place], Amy, MD as Consulting Physician (Dermatology) Lillian Rein, MD as Consulting Physician (Gynecology) [place], Heather, OD (Optometry) Marguerite Shiley, MD (Internal Medicine)  Extended Emergency Contact Information Primary Emergency Contact: Delany,Robert J Address: [address]          [place], [place] [address] [place] Home Phone: [phone] Work Phone: [phone] Mobile Phone: [phone] Relation: Spouse Secondary Emergency Contact: Derwood Flor  [place] Home Phone: [phone] Mobile Phone: [phone] Relation: Son  Code Status:  DNR Goals of care: Advanced Directive information    03/01/2024    1:08 PM  Advanced Directives  Does Patient Have a Medical Advance Directive? Yes  Type of Estate agent of [place];Out of facility DNR (pink MOST or yellow form);Living will  Does patient want to make changes to medical advance directive? No - Patient declined  Copy of Healthcare Power of Attorney in Chart? Yes - validated most recent copy scanned in chart (See row information)  Pre-existing out of facility DNR order (yellow form or pink MOST form) Pink MOST/Yellow Form most recent copy in chart - Physician notified to receive inpatient order     Chief Complaint  Patient presents with   Medical Management of Chronic Issues    Routine visit    HPI:  Pt is a 83 y.o. female seen today for medical management of chronic diseases.    Enrolled in hospice care  due to progressive dementia and chronic pain related issues. Needs assistance with all ADLs. Able to communicate needs but needs cuing and prompting. Spends most of the time in her [facility]  Anxiety: has periods of anxiety and prn xanax  ordered. Also started on depakote by hospice due to agitation in the afternoon hours. Behaviors have improved per staff. Does continue to need prn xanax  in the evening for anxiety and sundowning  Vascular dementia: Hx of TIA off plavix  per hospice, now on asa.   Off aricept  due to weight loss. MMSE 13/30 08/13/23.    Has chronic back and hip pain. Prior hx of laminectomy 2022. March 2024 MRI of hips Tear of the left gluteus minimus tendon High grade parital near complete tear of the right gluteus minimus tendon insertion  Also has a hx of sciatica with prior surgery and injections.  Currently on neurontin  BID. No reports of pain.  Hypothyroidism: levothyroxine  decreased to 75 mcg Lab Results  Component Value Date   TSH 0.76 10/13/2023    Constipation: bowels moving on miralax .   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 Street of back injections by Dr. Eben Golds 2016 with good relief, history of cervical DD  with possiblity of surgery by Dr. Gwendlyn Lemmings.   GERD (gastroesophageal reflux disease)    IBS with moderate refluc seen on upper GI study form January 2011 Dr. Lavaughn Portland   HTN (hypertension)    no meds   Hyperlipidemia    Hypothyroidism    IBS (irritable bowel syndrome)    Imbalance    Impingement syndrome of right shoulder 01/05/2013   Mass of left side of neck    Memory  loss    Mood disorder (HCC)    MVP (mitral valve prolapse)    Hx of    Osteopenia    Peripheral edema    Right rotator cuff tear 01/05/2013   Sciatic pain    recurrent   Seasonal allergies    Stroke [facility])    Tension headache    TIA (transient ischemic attack)    Tremor    Past Surgical History:  Procedure Laterality Date   ANTERIOR CERVICAL DECOMP/DISCECTOMY FUSION  04/2015    Dr. Hezekiah Louis, [facility]   CARDIAC CATHETERIZATION  2004   which revealed smooth and normal coronary arteries   CARPAL TUNNEL RELEASE  2000   lt   CATARACT EXTRACTION W/ INTRAOCULAR LENS  IMPLANT, BILATERAL  1/17   Dr. Kirkland Peppers   COSMETIC SURGERY  2010   eyes-facial   DENTAL SURGERY  1960   LASIK     LOOP RECORDER INSERTION N/A 09/22/2017   Procedure: LOOP RECORDER INSERTION;  Surgeon: Lei Pump, MD;  Location: [facility] INVASIVE CV LAB;  Service: Cardiovascular;  Laterality: N/A;   LUMBAR LAMINECTOMY/DECOMPRESSION MICRODISCECTOMY N/A 06/08/2021   Procedure: Laminectomy and Foraminotomy - L4-L5, L5-S1;  Surgeon: Agustina Aldrich, MD;  Location: [facility] OR;  Service: Neurosurgery;  Laterality: N/A;   SHOULDER ARTHROSCOPY WITH ROTATOR CUFF REPAIR Right 01/05/2013   Procedure: SHOULDER ARTHROSCOPY WITH ARTHROSCOPIC  ROTATOR CUFF REPAIR, ACROMIOPLASTY, EXTENSIVE DEBRIDEMENT;  Surgeon: Neville Barbone, MD;  Location: [facility];  Service: Orthopedics;  Laterality: Right;   TEE WITHOUT CARDIOVERSION N/A 09/22/2017   Procedure: TRANSESOPHAGEAL ECHOCARDIOGRAM (TEE);  Surgeon: Luana Rumple, MD;  Location: [facility];  Service: Cardiovascular;  Laterality: N/A;   TONSILLECTOMY     TUBAL LIGATION      Allergies  Allergen Reactions   Fluticasone Other (See Comments)    DRY EYES   Aciphex [Rabeprazole] Other (See Comments)    Critical    Codeine Nausea Only   Codeine Other (See Comments)    unknown   Cymbalta [Duloxetine Hcl] Other (See Comments)    Critical    Lexapro [Escitalopram] Other (See  Comments)    critical   Lipitor [Atorvastatin  Calcium ] Other (See Comments)    Intolerant    Morphine  Sulfate     Per matrix   Pneumovax [Pneumococcal Polysaccharide Vaccine] Other (See Comments)    moderate   Prevacid [Lansoprazole] Other (See Comments)    Unknown, listed on MAR   Rosuvastatin  Other (See Comments)    Intolerant    Sertraline  Other (See Comments)    critical   Statins Other (See Comments)    Critical    Sulfa Antibiotics Nausea And Vomiting   Tetracyclines & Related Other (See Comments)    Morphine  Sulfate   Welchol [Colesevelam] Other (See Comments)    Unknown, listed on MAR   Pneumovax 23 [Pneumococcal Vac Polyvalent] Rash    Injection site reaction    Outpatient Encounter Medications as of 03/01/2024  Medication Sig   acetaminophen  (TYLENOL ) 325 MG tablet Take 650 mg by mouth  every 6 (six) hours as needed.   ALPRAZolam  (XANAX ) 0.25 MG tablet Take 1 tablet (0.25 mg total) by mouth 2 (two) times daily as needed for anxiety.   aspirin  81 MG chewable tablet Chew 81 mg by mouth daily.   diclofenac Sodium (VOLTAREN) 1 % GEL Apply 2 g topically 2 (two) times daily as needed (pain).   divalproex (DEPAKOTE SPRINKLE) 125 MG capsule Take 125 mg by mouth at bedtime.   gabapentin  (NEURONTIN ) 100 MG capsule Take 100 mg by mouth 2 (two) times daily.   levothyroxine  (SYNTHROID ) 75 MCG tablet Take 75 mcg by mouth daily before breakfast.   memantine (NAMENDA) 5 MG tablet Take 5 mg by mouth 2 (two) times daily.   omeprazole  (PRILOSEC) 40 MG capsule Take 40 mg by mouth daily.   oxybutynin  (DITROPAN ) 5 MG tablet Take 1 tablet (5 mg total) by mouth daily.   polyethylene glycol (MIRALAX ) 17 g packet Take 17 g by mouth daily.   propranolol  (INDERAL ) 20 MG tablet Take 20 mg by mouth 2 (two) times daily. 1 tablet (20 mg) oral, Twice A Day   No facility-administered encounter medications on file as of 03/01/2024.    Review of Systems  Constitutional:  Negative for activity change,  appetite change, chills, diaphoresis, fatigue, fever and unexpected weight change.  HENT:  Negative for congestion.   Respiratory:  Negative for cough, shortness of breath and wheezing.   Cardiovascular:  Negative for chest pain, palpitations and leg swelling.  Gastrointestinal:  Negative for abdominal distention, abdominal pain, constipation and diarrhea.  Genitourinary:  Negative for difficulty urinating and dysuria.  Musculoskeletal:  Positive for arthralgias, back pain and gait problem. Negative for joint swelling and myalgias.  Neurological:  Negative for dizziness, tremors, seizures, syncope, facial asymmetry, speech difficulty, weakness, light-headedness, numbness and headaches.  Psychiatric/Behavioral:  Positive for confusion. Negative for agitation and behavioral problems. The patient is nervous/anxious.     Immunization History  Administered Date(s) Administered   Fluad Quad(high Dose 65+) 07/02/2022   Fluad Trivalent(High Dose 65+) 07/19/2023   Influenza, High Dose Seasonal PF 07/23/2017, 07/03/2019   Influenza-Unspecified 05/27/2010, 08/03/2010, 07/22/2011, 07/01/2012, 06/22/2013, 07/18/2020, 07/11/2021, 07/02/2022, 07/19/2023   Janssen (J&J) SARS-COV-2 Vaccination 11/06/2019   Moderna Covid-19 Vaccine  Bivalent Booster 73yrs & up 07/19/2023   Moderna SARS-COV2 Booster Vaccination 08/05/2021   Moderna Sars-Covid-2 Vaccination 08/07/2020   PFIZER(Purple Top)SARS-COV-2 Vaccination 10/09/2019, 08/07/2020   Pfizer Covid-19 Vaccine Bivalent Booster 71yrs & up 01/21/2023   Pneumococcal Conjugate-13 02/08/2015   Pneumococcal Polysaccharide-23 05/28/2006, 10/13/2011   Pneumococcal-Unspecified 10/17/2013   Td 03/29/2002   Tdap 09/27/2001, 04/25/2002, 08/18/2012, 10/17/2013   Zoster Recombinant(Shingrix) 05/23/2021, 07/23/2021   Zoster, Live 08/30/2006   Zoster, Unspecified 08/30/2006   Pertinent  Health Maintenance Due  Topic Date Due   INFLUENZA VACCINE  04/27/2024   DEXA SCAN   Completed      08/07/2022   11:17 AM 08/07/2022   11:03 PM 08/08/2022    8:00 PM 08/09/2022    8:00 AM 08/17/2022   10:59 AM  Fall Risk  Falls in the past year?     1  Was there an injury with Fall?     1  Fall Risk Category Calculator     3  Fall Risk Category (Retired)     High  (RETIRED) Patient Fall Risk Level High fall risk High fall risk High fall risk High fall risk High fall risk  Patient at Risk for Falls Due to     History of  fall(s);Impaired balance/gait;Impaired mobility  Fall risk Follow up     Falls evaluation completed   Functional Status Survey:    Vitals:   03/01/24 1308  BP: 110/68  Pulse: 61  Resp: 18  Temp: (!) 97 F (36.1 C)  SpO2: 92%  Weight: 150 lb (68 kg)  Height: 5\' 1"  (1.549 m)   Body mass index is 28.34 kg/m. Physical Exam Vitals and nursing note reviewed.  Constitutional:      General: She is not in acute distress.    Appearance: Normal appearance. She is not diaphoretic.  HENT:     Head: Normocephalic and atraumatic.     Nose: No congestion.     Mouth/Throat:     Mouth: Mucous membranes are moist.     Pharynx: Oropharynx is clear. No oropharyngeal exudate or posterior oropharyngeal erythema.  Eyes:     Conjunctiva/sclera: Conjunctivae normal.     Pupils: Pupils are equal, round, and reactive to light.  Neck:     Thyroid : No thyromegaly.     Vascular: No JVD.  Cardiovascular:     Rate and Rhythm: Normal rate and regular rhythm.     Heart sounds: Normal heart sounds. No murmur heard. Pulmonary:     Effort: Pulmonary effort is normal. No respiratory distress.     Breath sounds: Normal breath sounds. No stridor.  Abdominal:     General: Bowel sounds are normal. There is no distension.     Palpations: Abdomen is soft.     Tenderness: There is no abdominal tenderness.  Musculoskeletal:     Cervical back: No muscular tenderness.     Right lower leg: No edema.     Left lower leg: No edema.  Skin:    General: Skin is warm and dry.   Neurological:     General: No focal deficit present.     Mental Status: She is alert. Mental status is at baseline.  Psychiatric:        Mood and Affect: Mood normal.     Labs reviewed: No results for input(s): "NA", "K", "CL", "CO2", "GLUCOSE", "BUN", "CREATININE", "CALCIUM ", "MG", "PHOS" in the last 8760 hours. No results for input(s): "AST", "ALT", "ALKPHOS", "BILITOT", "PROT", "ALBUMIN" in the last 8760 hours. No results for input(s): "WBC", "NEUTROABS", "HGB", "HCT", "MCV", "PLT" in the last 8760 hours. Lab Results  Component Value Date   TSH 0.76 10/13/2023   Lab Results  Component Value Date   HGBA1C 5.6 09/21/2017   Lab Results  Component Value Date   CHOL 174 03/23/2022   HDL 66 03/23/2022   LDLCALC 81 03/23/2022   TRIG 134 03/23/2022   CHOLHDL 2.1 09/21/2017    Significant Diagnostic Results in last 30 days:  No results found.  Assessment/Plan  1. Vascular dementia without behavioral disturbance (HCC) Progressive decline in cognition and physical function c/w the disease. Continue supportive care in the skilled environment. Followed by hospice Off plavix  per hospice, now on baby asa Continue Namenda.   2. Overactive bladder Continue ditropan   3. Tremors of nervous system Continue inderal    4. Lumbar stenosis with neurogenic claudication No current issues.  On neurontin  and tylenol  prn  5. Anxiety Improve with scheduled depakote. And prn xanax .   6. Slow transit constipation Continue miralax   7. Hypothyroidism Continue current dose of levothyroxine       Labs/tests ordered:  NA

## 2024-03-07 DIAGNOSIS — I10 Essential (primary) hypertension: Secondary | ICD-10-CM | POA: Diagnosis not present

## 2024-03-07 DIAGNOSIS — I672 Cerebral atherosclerosis: Secondary | ICD-10-CM | POA: Diagnosis not present

## 2024-03-07 DIAGNOSIS — R634 Abnormal weight loss: Secondary | ICD-10-CM | POA: Diagnosis not present

## 2024-03-07 DIAGNOSIS — F0154 Vascular dementia, unspecified severity, with anxiety: Secondary | ICD-10-CM | POA: Diagnosis not present

## 2024-03-07 DIAGNOSIS — F0153 Vascular dementia, unspecified severity, with mood disturbance: Secondary | ICD-10-CM | POA: Diagnosis not present

## 2024-03-07 DIAGNOSIS — I69318 Other symptoms and signs involving cognitive functions following cerebral infarction: Secondary | ICD-10-CM | POA: Diagnosis not present

## 2024-03-14 ENCOUNTER — Other Ambulatory Visit: Payer: Self-pay | Admitting: Orthopedic Surgery

## 2024-03-14 DIAGNOSIS — F411 Generalized anxiety disorder: Secondary | ICD-10-CM

## 2024-03-14 DIAGNOSIS — I69318 Other symptoms and signs involving cognitive functions following cerebral infarction: Secondary | ICD-10-CM | POA: Diagnosis not present

## 2024-03-14 DIAGNOSIS — F0154 Vascular dementia, unspecified severity, with anxiety: Secondary | ICD-10-CM | POA: Diagnosis not present

## 2024-03-14 DIAGNOSIS — R634 Abnormal weight loss: Secondary | ICD-10-CM | POA: Diagnosis not present

## 2024-03-14 DIAGNOSIS — I672 Cerebral atherosclerosis: Secondary | ICD-10-CM | POA: Diagnosis not present

## 2024-03-14 DIAGNOSIS — F0153 Vascular dementia, unspecified severity, with mood disturbance: Secondary | ICD-10-CM | POA: Diagnosis not present

## 2024-03-14 DIAGNOSIS — F0150 Vascular dementia without behavioral disturbance: Secondary | ICD-10-CM

## 2024-03-14 DIAGNOSIS — I10 Essential (primary) hypertension: Secondary | ICD-10-CM | POA: Diagnosis not present

## 2024-03-14 MED ORDER — ALPRAZOLAM 0.25 MG PO TABS: 0.2500 mg | ORAL_TABLET | Freq: Two times a day (BID) | ORAL | 0 refills | Status: DC | PRN

## 2024-03-21 DIAGNOSIS — F0154 Vascular dementia, unspecified severity, with anxiety: Secondary | ICD-10-CM | POA: Diagnosis not present

## 2024-03-21 DIAGNOSIS — I69318 Other symptoms and signs involving cognitive functions following cerebral infarction: Secondary | ICD-10-CM | POA: Diagnosis not present

## 2024-03-21 DIAGNOSIS — I672 Cerebral atherosclerosis: Secondary | ICD-10-CM | POA: Diagnosis not present

## 2024-03-21 DIAGNOSIS — I10 Essential (primary) hypertension: Secondary | ICD-10-CM | POA: Diagnosis not present

## 2024-03-21 DIAGNOSIS — R634 Abnormal weight loss: Secondary | ICD-10-CM | POA: Diagnosis not present

## 2024-03-21 DIAGNOSIS — F0153 Vascular dementia, unspecified severity, with mood disturbance: Secondary | ICD-10-CM | POA: Diagnosis not present

## 2024-03-23 DIAGNOSIS — I69318 Other symptoms and signs involving cognitive functions following cerebral infarction: Secondary | ICD-10-CM | POA: Diagnosis not present

## 2024-03-23 DIAGNOSIS — R634 Abnormal weight loss: Secondary | ICD-10-CM | POA: Diagnosis not present

## 2024-03-23 DIAGNOSIS — I10 Essential (primary) hypertension: Secondary | ICD-10-CM | POA: Diagnosis not present

## 2024-03-23 DIAGNOSIS — F0154 Vascular dementia, unspecified severity, with anxiety: Secondary | ICD-10-CM | POA: Diagnosis not present

## 2024-03-23 DIAGNOSIS — I672 Cerebral atherosclerosis: Secondary | ICD-10-CM | POA: Diagnosis not present

## 2024-03-23 DIAGNOSIS — F0153 Vascular dementia, unspecified severity, with mood disturbance: Secondary | ICD-10-CM | POA: Diagnosis not present

## 2024-03-27 DIAGNOSIS — I69318 Other symptoms and signs involving cognitive functions following cerebral infarction: Secondary | ICD-10-CM | POA: Diagnosis not present

## 2024-03-27 DIAGNOSIS — F0154 Vascular dementia, unspecified severity, with anxiety: Secondary | ICD-10-CM | POA: Diagnosis not present

## 2024-03-27 DIAGNOSIS — K219 Gastro-esophageal reflux disease without esophagitis: Secondary | ICD-10-CM | POA: Diagnosis not present

## 2024-03-27 DIAGNOSIS — F0153 Vascular dementia, unspecified severity, with mood disturbance: Secondary | ICD-10-CM | POA: Diagnosis not present

## 2024-03-27 DIAGNOSIS — M48061 Spinal stenosis, lumbar region without neurogenic claudication: Secondary | ICD-10-CM | POA: Diagnosis not present

## 2024-03-27 DIAGNOSIS — I672 Cerebral atherosclerosis: Secondary | ICD-10-CM | POA: Diagnosis not present

## 2024-03-27 DIAGNOSIS — E785 Hyperlipidemia, unspecified: Secondary | ICD-10-CM | POA: Diagnosis not present

## 2024-03-27 DIAGNOSIS — E039 Hypothyroidism, unspecified: Secondary | ICD-10-CM | POA: Diagnosis not present

## 2024-03-27 DIAGNOSIS — Z9181 History of falling: Secondary | ICD-10-CM | POA: Diagnosis not present

## 2024-03-27 DIAGNOSIS — I10 Essential (primary) hypertension: Secondary | ICD-10-CM | POA: Diagnosis not present

## 2024-03-27 DIAGNOSIS — R634 Abnormal weight loss: Secondary | ICD-10-CM | POA: Diagnosis not present

## 2024-03-27 DIAGNOSIS — S2243XD Multiple fractures of ribs, bilateral, subsequent encounter for fracture with routine healing: Secondary | ICD-10-CM | POA: Diagnosis not present

## 2024-03-28 DIAGNOSIS — F0153 Vascular dementia, unspecified severity, with mood disturbance: Secondary | ICD-10-CM | POA: Diagnosis not present

## 2024-03-28 DIAGNOSIS — I69318 Other symptoms and signs involving cognitive functions following cerebral infarction: Secondary | ICD-10-CM | POA: Diagnosis not present

## 2024-03-28 DIAGNOSIS — I10 Essential (primary) hypertension: Secondary | ICD-10-CM | POA: Diagnosis not present

## 2024-03-28 DIAGNOSIS — I672 Cerebral atherosclerosis: Secondary | ICD-10-CM | POA: Diagnosis not present

## 2024-03-28 DIAGNOSIS — F0154 Vascular dementia, unspecified severity, with anxiety: Secondary | ICD-10-CM | POA: Diagnosis not present

## 2024-03-28 DIAGNOSIS — R634 Abnormal weight loss: Secondary | ICD-10-CM | POA: Diagnosis not present

## 2024-04-04 DIAGNOSIS — I672 Cerebral atherosclerosis: Secondary | ICD-10-CM | POA: Diagnosis not present

## 2024-04-04 DIAGNOSIS — I69318 Other symptoms and signs involving cognitive functions following cerebral infarction: Secondary | ICD-10-CM | POA: Diagnosis not present

## 2024-04-04 DIAGNOSIS — F0154 Vascular dementia, unspecified severity, with anxiety: Secondary | ICD-10-CM | POA: Diagnosis not present

## 2024-04-04 DIAGNOSIS — F0153 Vascular dementia, unspecified severity, with mood disturbance: Secondary | ICD-10-CM | POA: Diagnosis not present

## 2024-04-04 DIAGNOSIS — R634 Abnormal weight loss: Secondary | ICD-10-CM | POA: Diagnosis not present

## 2024-04-04 DIAGNOSIS — I10 Essential (primary) hypertension: Secondary | ICD-10-CM | POA: Diagnosis not present

## 2024-04-11 DIAGNOSIS — F0153 Vascular dementia, unspecified severity, with mood disturbance: Secondary | ICD-10-CM | POA: Diagnosis not present

## 2024-04-11 DIAGNOSIS — I672 Cerebral atherosclerosis: Secondary | ICD-10-CM | POA: Diagnosis not present

## 2024-04-11 DIAGNOSIS — R634 Abnormal weight loss: Secondary | ICD-10-CM | POA: Diagnosis not present

## 2024-04-11 DIAGNOSIS — F0154 Vascular dementia, unspecified severity, with anxiety: Secondary | ICD-10-CM | POA: Diagnosis not present

## 2024-04-11 DIAGNOSIS — I10 Essential (primary) hypertension: Secondary | ICD-10-CM | POA: Diagnosis not present

## 2024-04-11 DIAGNOSIS — I69318 Other symptoms and signs involving cognitive functions following cerebral infarction: Secondary | ICD-10-CM | POA: Diagnosis not present

## 2024-04-18 DIAGNOSIS — I672 Cerebral atherosclerosis: Secondary | ICD-10-CM | POA: Diagnosis not present

## 2024-04-18 DIAGNOSIS — R634 Abnormal weight loss: Secondary | ICD-10-CM | POA: Diagnosis not present

## 2024-04-18 DIAGNOSIS — I10 Essential (primary) hypertension: Secondary | ICD-10-CM | POA: Diagnosis not present

## 2024-04-18 DIAGNOSIS — I69318 Other symptoms and signs involving cognitive functions following cerebral infarction: Secondary | ICD-10-CM | POA: Diagnosis not present

## 2024-04-18 DIAGNOSIS — F0154 Vascular dementia, unspecified severity, with anxiety: Secondary | ICD-10-CM | POA: Diagnosis not present

## 2024-04-18 DIAGNOSIS — F0153 Vascular dementia, unspecified severity, with mood disturbance: Secondary | ICD-10-CM | POA: Diagnosis not present

## 2024-04-20 ENCOUNTER — Non-Acute Institutional Stay (SKILLED_NURSING_FACILITY): Admitting: Adult Health

## 2024-04-20 ENCOUNTER — Encounter: Payer: Self-pay | Admitting: Adult Health

## 2024-04-20 DIAGNOSIS — N3281 Overactive bladder: Secondary | ICD-10-CM

## 2024-04-20 DIAGNOSIS — R251 Tremor, unspecified: Secondary | ICD-10-CM | POA: Diagnosis not present

## 2024-04-20 DIAGNOSIS — F0150 Vascular dementia without behavioral disturbance: Secondary | ICD-10-CM | POA: Diagnosis not present

## 2024-04-20 DIAGNOSIS — R609 Edema, unspecified: Secondary | ICD-10-CM | POA: Diagnosis not present

## 2024-04-20 DIAGNOSIS — F411 Generalized anxiety disorder: Secondary | ICD-10-CM | POA: Diagnosis not present

## 2024-04-20 DIAGNOSIS — R635 Abnormal weight gain: Secondary | ICD-10-CM

## 2024-04-20 DIAGNOSIS — J9811 Atelectasis: Secondary | ICD-10-CM | POA: Diagnosis not present

## 2024-04-20 DIAGNOSIS — R051 Acute cough: Secondary | ICD-10-CM | POA: Diagnosis not present

## 2024-04-20 DIAGNOSIS — M48062 Spinal stenosis, lumbar region with neurogenic claudication: Secondary | ICD-10-CM | POA: Diagnosis not present

## 2024-04-20 DIAGNOSIS — E039 Hypothyroidism, unspecified: Secondary | ICD-10-CM

## 2024-04-20 DIAGNOSIS — J8410 Pulmonary fibrosis, unspecified: Secondary | ICD-10-CM | POA: Diagnosis not present

## 2024-04-20 MED ORDER — CEFDINIR 300 MG PO CAPS: 300.0000 mg | ORAL_CAPSULE | Freq: Two times a day (BID) | ORAL | Status: AC

## 2024-04-20 NOTE — Addendum Note (Signed)
 Addended by: DARLEAN TAWNI CROME on: 04/20/2024 06:31 PM   Modules accepted: Orders

## 2024-04-20 NOTE — Progress Notes (Addendum)
 Location:  [facility] Room Number: 146/A Place of Service:  SNF (31) hospice  Provider:  Darlean Maus, NP    Patient Care Team: Charlanne Fredia CROME, MD as PCP - General (Internal Medicine) Nahser, Aleene PARAS, MD (Inactive) as PCP - Cardiology (Cardiology) Rosalie Kitchens, MD as Consulting Physician (Gastroenterology) Onita Duos, MD as Consulting Physician (Neurology) Colon Shove, MD as Consulting Physician (Neurosurgery) [place], Amy, MD as Consulting Physician (Dermatology) Cleotilde Ronal RAMAN, MD as Consulting Physician (Gynecology) [place], Heather, OD (Optometry) Charlanne Fredia CROME, MD (Internal Medicine)  Extended Emergency Contact Information Primary Emergency Contact: Vandeusen,Robert J Address: [address]          [place], [place] [address] [place] Home Phone: [phone] Work Phone: [phone] Mobile Phone: [phone] Relation: Spouse Secondary Emergency Contact: Rea Mickey Lamar PARAS  [place] Home Phone: [phone] Mobile Phone: [phone] Relation: Son  Code Status:  DNR Goals of care: Advanced Directive information    04/20/2024   11:14 AM  Advanced Directives  Does Patient Have a Medical Advance Directive? Yes  Type of Estate agent of [place];Out of facility DNR (pink MOST or yellow form);Living will  Does patient want to make changes to medical advance directive? No - Patient declined  Copy of Healthcare Power of Attorney in Chart? Yes - validated most recent copy scanned in chart (See row information)  Pre-existing out of facility DNR order (yellow form or pink MOST form) Pink MOST/Yellow Form most recent copy in chart - Physician notified to receive inpatient order     Chief Complaint  Patient presents with   Medical Management of Chronic Issues    Routine Visit    HPI:  Pt is a 83 y.o. female seen today for medical management of chronic diseases.    Here for a routine  follow up Nurse reports a cough x 1 month. Some brown and bloody sputum. No sob or chest pain Also having edema in her legs. Has gained 8 lbs in the past couple of months. Hospice ordered compression hose. No fever. Normal 02 sats 94% Enrolled in hospice care due to progressive dementia and chronic pain related issues. Needs assistance with all ADLs. Able to communicate needs but needs cuing and prompting. Spends most of the time in her [facility]  Anxiety: has periods of anxiety and prn xanax  ordered. Also started on depakote by hospice due to agitation in the afternoon hours. Behaviors have improved per staff. Vascular dementia: Hx of TIA off plavix  per hospice, now on asa.   Off aricept  due to weight loss. On namenda MMSE 13/30 08/13/23.    Has chronic back and hip pain. Prior hx of laminectomy 2022. March 2024 MRI of hips Tear of the left gluteus minimus tendon High grade parital near complete tear of the right gluteus minimus tendon insertion  Also has a hx of sciatica with prior surgery and injections.  Currently on neurontin  BID. No reports of pain.  Hypothyroidism: levothyroxine  decreased to 75 mcg Lab Results  Component Value Date   TSH 0.76 10/13/2023   Constipation: bowels moving on miralax .   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 Street of back injections by Dr. Letha 2016 with good relief, history of cervical DD with possiblity of surgery by Dr. Malcolm.   GERD (gastroesophageal reflux disease)    IBS with moderate refluc seen on upper GI study form January 2011 Dr. Rosalie   HTN  (hypertension)    no meds   Hyperlipidemia    Hypothyroidism    IBS (irritable bowel syndrome)    Imbalance    Impingement syndrome of right shoulder 01/05/2013   Mass of left side of neck    Memory loss    Mood disorder (HCC)    MVP (mitral valve prolapse)    Hx of    Osteopenia    Peripheral edema    Right rotator cuff tear 01/05/2013   Sciatic pain    recurrent   Seasonal allergies    Stroke [facility])    Tension headache    TIA (transient ischemic attack)    Tremor    Past Surgical History:  Procedure Laterality Date   ANTERIOR CERVICAL DECOMP/DISCECTOMY FUSION  04/2015    Dr. Samule, [facility]   CARDIAC CATHETERIZATION  2004   which revealed smooth and normal coronary arteries   CARPAL TUNNEL RELEASE  2000   lt   CATARACT EXTRACTION W/ INTRAOCULAR LENS  IMPLANT, BILATERAL  1/17   Dr. Meridee   COSMETIC SURGERY  2010   eyes-facial   DENTAL SURGERY  1960   LASIK     LOOP RECORDER INSERTION N/A 09/22/2017   Procedure: LOOP RECORDER INSERTION;  Surgeon: Inocencio Soyla Lunger, MD;  Location: [facility] INVASIVE CV LAB;  Service: Cardiovascular;  Laterality: N/A;   LUMBAR LAMINECTOMY/DECOMPRESSION MICRODISCECTOMY N/A 06/08/2021   Procedure: Laminectomy and Foraminotomy - L4-L5, L5-S1;  Surgeon: Louis Shove, MD;  Location: [facility] OR;  Service: Neurosurgery;  Laterality: N/A;   SHOULDER ARTHROSCOPY WITH ROTATOR CUFF REPAIR Right 01/05/2013   Procedure: SHOULDER ARTHROSCOPY WITH ARTHROSCOPIC  ROTATOR CUFF REPAIR, ACROMIOPLASTY, EXTENSIVE DEBRIDEMENT;  Surgeon: Fonda SHAUNNA Olmsted, MD;  Location:  SURGERY CENTER;  Service: Orthopedics;  Laterality: Right;   TEE WITHOUT CARDIOVERSION N/A 09/22/2017   Procedure: TRANSESOPHAGEAL ECHOCARDIOGRAM (TEE);  Surgeon: Francyne Headland, MD;  Location: [facility] ENDOSCOPY;  Service: Cardiovascular;  Laterality: N/A;   TONSILLECTOMY     TUBAL LIGATION      Allergies  Allergen Reactions   Fluticasone Other (See Comments)    DRY EYES   Aciphex  [Rabeprazole] Other (See Comments)    Critical    Codeine Nausea Only   Codeine Other (See Comments)    unknown   Cymbalta [Duloxetine Hcl] Other (See Comments)    Critical    Lexapro [Escitalopram] Other (See Comments)    critical   Lipitor [Atorvastatin  Calcium ] Other (See Comments)    Intolerant    Morphine  Sulfate     Per matrix   Pneumovax [Pneumococcal Polysaccharide Vaccine] Other (See Comments)    moderate   Prevacid [Lansoprazole] Other (See Comments)    Unknown, listed on [facility]   Rosuvastatin  Other (See Comments)    Intolerant    Sertraline  Other (See Comments)    critical   Statins Other (See Comments)    Critical    Sulfa Antibiotics Nausea And Vomiting   Tetracyclines & Related Other (See Comments)    Morphine  Sulfate   Welchol [Colesevelam] Other (See Comments)    Unknown, listed on [facility]  Pneumovax 23 [Pneumococcal Vac Polyvalent] Rash    Injection site reaction    Outpatient Encounter Medications as of 04/20/2024  Medication Sig   acetaminophen  (TYLENOL ) 325 MG tablet Take 650 mg by mouth every 6 (six) hours as needed.   ALPRAZolam  (XANAX ) 0.25 MG tablet Take 1 tablet (0.25 mg total) by mouth 2 (two) times daily as needed for anxiety.   aspirin  81 MG chewable tablet Chew 81 mg by mouth daily.   diclofenac Sodium (VOLTAREN) 1 % GEL Apply 2 g topically 2 (two) times daily as needed (pain).   divalproex (DEPAKOTE SPRINKLE) 125 MG capsule Take 125 mg by mouth at bedtime.   gabapentin  (NEURONTIN ) 100 MG capsule Take 100 mg by mouth 2 (two) times daily.   levothyroxine  (SYNTHROID ) 75 MCG tablet Take 75 mcg by mouth daily before breakfast.   memantine (NAMENDA) 5 MG tablet Take 5 mg by mouth 2 (two) times daily.   omeprazole  (PRILOSEC) 40 MG capsule Take 40 mg by mouth daily.   oxybutynin  (DITROPAN ) 5 MG tablet Take 1 tablet (5 mg total) by mouth daily.   polyethylene glycol (MIRALAX ) 17 g packet Take 17 g by mouth daily.   propranolol  (INDERAL ) 20 MG tablet Take  20 mg by mouth 2 (two) times daily. 1 tablet (20 mg) oral, Twice A Day   No facility-administered encounter medications on file as of 04/20/2024.    Review of Systems  Unable to perform ROS: Dementia    Immunization History  Administered Date(s) Administered   Fluad Quad(high Dose 65+) 07/02/2022   Fluad Trivalent(High Dose 65+) 07/19/2023   Influenza, High Dose Seasonal PF 07/23/2017, 07/03/2019   Influenza-Unspecified 05/27/2010, 08/03/2010, 07/22/2011, 07/01/2012, 06/22/2013, 07/18/2020, 07/11/2021, 07/02/2022, 07/19/2023   Janssen (J&J) SARS-COV-2 Vaccination 11/06/2019   Moderna Covid-19 Vaccine  Bivalent Booster 87yrs & up 07/19/2023   Moderna SARS-COV2 Booster Vaccination 08/05/2021   Moderna Sars-Covid-2 Vaccination 08/07/2020   PFIZER(Purple Top)SARS-COV-2 Vaccination 10/09/2019, 08/07/2020   Pfizer Covid-19 Vaccine Bivalent Booster 64yrs & up 01/21/2023   Pneumococcal Conjugate-13 02/08/2015   Pneumococcal Polysaccharide-23 05/28/2006, 10/13/2011   Pneumococcal-Unspecified 10/17/2013   Td 03/29/2002   Tdap 09/27/2001, 04/25/2002, 08/18/2012, 10/17/2013, 02/07/2024   Zoster Recombinant(Shingrix) 05/23/2021, 07/23/2021   Zoster, Live 08/30/2006   Zoster, Unspecified 08/30/2006   Pertinent  Health Maintenance Due  Topic Date Due   INFLUENZA VACCINE  04/27/2024   DEXA SCAN  Completed      08/07/2022   11:17 AM 08/07/2022   11:03 PM 08/08/2022    8:00 PM 08/09/2022    8:00 AM 08/17/2022   10:59 AM  Fall Risk  Falls in the past year?     1  Was there an injury with Fall?     1  Fall Risk Category Calculator     3  Fall Risk Category (Retired)     High   (RETIRED) Patient Fall Risk Level High fall risk  High fall risk  High fall risk  High fall risk  High fall risk   Patient at Risk for Falls Due to     History of fall(s);Impaired balance/gait;Impaired mobility  Fall risk Follow up     Falls evaluation completed      Data saved with a previous flowsheet row  definition   Functional Status Survey:    There were no vitals filed for this visit. There is no height or weight on file to calculate BMI. Physical Exam Vitals reviewed.  Constitutional:      General: She is not in  acute distress.    Appearance: She is not diaphoretic.  HENT:     Head: Normocephalic and atraumatic.     Nose: Nose normal.     Mouth/Throat:     Mouth: Mucous membranes are moist.     Pharynx: Oropharynx is clear.  Eyes:     Conjunctiva/sclera: Conjunctivae normal.     Pupils: Pupils are equal, round, and reactive to light.  Neck:     Vascular: No JVD.  Cardiovascular:     Rate and Rhythm: Normal rate and regular rhythm.     Heart sounds: No murmur heard. Pulmonary:     Effort: Pulmonary effort is normal. No respiratory distress.     Breath sounds: No wheezing.     Comments: Decreased bases Abdominal:     General: Bowel sounds are normal. There is no distension.     Palpations: Abdomen is soft.     Tenderness: There is no abdominal tenderness.  Musculoskeletal:     Cervical back: No rigidity or tenderness.     Right lower leg: Edema present.     Left lower leg: Edema present.  Lymphadenopathy:     Cervical: No cervical adenopathy.  Skin:    General: Skin is warm and dry.  Neurological:     General: No focal deficit present.     Mental Status: She is alert. Mental status is at baseline.     Labs reviewed: No results for input(s): NA, K, CL, CO2, GLUCOSE, BUN, CREATININE, CALCIUM , MG, PHOS in the last 8760 hours. No results for input(s): AST, ALT, ALKPHOS, BILITOT, PROT, ALBUMIN in the last 8760 hours. No results for input(s): WBC, NEUTROABS, HGB, HCT, MCV, PLT in the last 8760 hours. Lab Results  Component Value Date   TSH 0.76 10/13/2023   Lab Results  Component Value Date   HGBA1C 5.6 09/21/2017   Lab Results  Component Value Date   CHOL 174 03/23/2022   HDL 66 03/23/2022   LDLCALC 81 03/23/2022    TRIG 134 03/23/2022   CHOLHDL 2.1 09/21/2017    Significant Diagnostic Results in last 30 days:  No results found.  Assessment/Plan  1. Acute cough (Primary) Present for 1 month with brownish reddish sputum small amts No chest pain, no sob, no low 02 sats Will obtain CXR ?CHF vs PNA  2. Weight gain With edema Check xray and labs Continue compression hose On depakote which can cause weight gain  3. Lumbar stenosis with neurogenic claudication Chronic pain is controlled with tylenol  and gabapentin   4. Hypothyroidism, unspecified type On levothyroxine   5. Generalized anxiety disorder Using depakote and prn xanax  Mood is stable   6. Overactive bladder No current issues on ditropan    7. Tremor Controlled with propranolol    8. Vascular dementia without behavioral disturbance (HCC) Progressive decline in cognition and physical function c/w the disease. Continue supportive care in the skilled environment. On namenda On asa    Family/ staff Communication: Discussed with her son Dr Octaviano Sabal. He would like in house treatment for reversible issues. No hospitalizations.   Labs/tests ordered:  CBC CMP BNP CXR   Addendum CBC CMP BNP normal CXR possible granuloma in the right lung. No infiltrate or effusion. No acute findings. No indication of CHF Edema may be due to immobility and taking depakote.  Recommend Omnicef for cough present for 1 month

## 2024-04-21 DIAGNOSIS — F0153 Vascular dementia, unspecified severity, with mood disturbance: Secondary | ICD-10-CM | POA: Diagnosis not present

## 2024-04-21 DIAGNOSIS — I10 Essential (primary) hypertension: Secondary | ICD-10-CM | POA: Diagnosis not present

## 2024-04-21 DIAGNOSIS — I672 Cerebral atherosclerosis: Secondary | ICD-10-CM | POA: Diagnosis not present

## 2024-04-21 DIAGNOSIS — F0154 Vascular dementia, unspecified severity, with anxiety: Secondary | ICD-10-CM | POA: Diagnosis not present

## 2024-04-21 DIAGNOSIS — I69318 Other symptoms and signs involving cognitive functions following cerebral infarction: Secondary | ICD-10-CM | POA: Diagnosis not present

## 2024-04-21 DIAGNOSIS — R634 Abnormal weight loss: Secondary | ICD-10-CM | POA: Diagnosis not present

## 2024-04-25 DIAGNOSIS — R634 Abnormal weight loss: Secondary | ICD-10-CM | POA: Diagnosis not present

## 2024-04-25 DIAGNOSIS — F0153 Vascular dementia, unspecified severity, with mood disturbance: Secondary | ICD-10-CM | POA: Diagnosis not present

## 2024-04-25 DIAGNOSIS — I672 Cerebral atherosclerosis: Secondary | ICD-10-CM | POA: Diagnosis not present

## 2024-04-25 DIAGNOSIS — I10 Essential (primary) hypertension: Secondary | ICD-10-CM | POA: Diagnosis not present

## 2024-04-25 DIAGNOSIS — I69318 Other symptoms and signs involving cognitive functions following cerebral infarction: Secondary | ICD-10-CM | POA: Diagnosis not present

## 2024-04-25 DIAGNOSIS — F0154 Vascular dementia, unspecified severity, with anxiety: Secondary | ICD-10-CM | POA: Diagnosis not present

## 2024-04-27 DIAGNOSIS — F0154 Vascular dementia, unspecified severity, with anxiety: Secondary | ICD-10-CM | POA: Diagnosis not present

## 2024-04-27 DIAGNOSIS — K219 Gastro-esophageal reflux disease without esophagitis: Secondary | ICD-10-CM | POA: Diagnosis not present

## 2024-04-27 DIAGNOSIS — E785 Hyperlipidemia, unspecified: Secondary | ICD-10-CM | POA: Diagnosis not present

## 2024-04-27 DIAGNOSIS — M48061 Spinal stenosis, lumbar region without neurogenic claudication: Secondary | ICD-10-CM | POA: Diagnosis not present

## 2024-04-27 DIAGNOSIS — F0153 Vascular dementia, unspecified severity, with mood disturbance: Secondary | ICD-10-CM | POA: Diagnosis not present

## 2024-04-27 DIAGNOSIS — I672 Cerebral atherosclerosis: Secondary | ICD-10-CM | POA: Diagnosis not present

## 2024-04-27 DIAGNOSIS — S2243XD Multiple fractures of ribs, bilateral, subsequent encounter for fracture with routine healing: Secondary | ICD-10-CM | POA: Diagnosis not present

## 2024-04-27 DIAGNOSIS — I69318 Other symptoms and signs involving cognitive functions following cerebral infarction: Secondary | ICD-10-CM | POA: Diagnosis not present

## 2024-04-27 DIAGNOSIS — R634 Abnormal weight loss: Secondary | ICD-10-CM | POA: Diagnosis not present

## 2024-04-27 DIAGNOSIS — I10 Essential (primary) hypertension: Secondary | ICD-10-CM | POA: Diagnosis not present

## 2024-04-27 DIAGNOSIS — Z9181 History of falling: Secondary | ICD-10-CM | POA: Diagnosis not present

## 2024-04-27 DIAGNOSIS — E039 Hypothyroidism, unspecified: Secondary | ICD-10-CM | POA: Diagnosis not present

## 2024-05-02 DIAGNOSIS — F0153 Vascular dementia, unspecified severity, with mood disturbance: Secondary | ICD-10-CM | POA: Diagnosis not present

## 2024-05-02 DIAGNOSIS — I672 Cerebral atherosclerosis: Secondary | ICD-10-CM | POA: Diagnosis not present

## 2024-05-02 DIAGNOSIS — R634 Abnormal weight loss: Secondary | ICD-10-CM | POA: Diagnosis not present

## 2024-05-02 DIAGNOSIS — I69318 Other symptoms and signs involving cognitive functions following cerebral infarction: Secondary | ICD-10-CM | POA: Diagnosis not present

## 2024-05-02 DIAGNOSIS — F0154 Vascular dementia, unspecified severity, with anxiety: Secondary | ICD-10-CM | POA: Diagnosis not present

## 2024-05-02 DIAGNOSIS — I10 Essential (primary) hypertension: Secondary | ICD-10-CM | POA: Diagnosis not present

## 2024-05-04 ENCOUNTER — Other Ambulatory Visit: Payer: Self-pay | Admitting: Adult Health

## 2024-05-04 MED ORDER — ALPRAZOLAM 0.25 MG PO TABS: 0.2500 mg | ORAL_TABLET | Freq: Two times a day (BID) | ORAL | 2 refills | Status: DC

## 2024-05-09 DIAGNOSIS — F0153 Vascular dementia, unspecified severity, with mood disturbance: Secondary | ICD-10-CM | POA: Diagnosis not present

## 2024-05-09 DIAGNOSIS — F0154 Vascular dementia, unspecified severity, with anxiety: Secondary | ICD-10-CM | POA: Diagnosis not present

## 2024-05-09 DIAGNOSIS — I672 Cerebral atherosclerosis: Secondary | ICD-10-CM | POA: Diagnosis not present

## 2024-05-09 DIAGNOSIS — I69318 Other symptoms and signs involving cognitive functions following cerebral infarction: Secondary | ICD-10-CM | POA: Diagnosis not present

## 2024-05-09 DIAGNOSIS — R634 Abnormal weight loss: Secondary | ICD-10-CM | POA: Diagnosis not present

## 2024-05-09 DIAGNOSIS — I10 Essential (primary) hypertension: Secondary | ICD-10-CM | POA: Diagnosis not present

## 2024-05-15 ENCOUNTER — Non-Acute Institutional Stay (SKILLED_NURSING_FACILITY): Payer: Self-pay | Admitting: Internal Medicine

## 2024-05-15 ENCOUNTER — Encounter: Payer: Self-pay | Admitting: Internal Medicine

## 2024-05-15 DIAGNOSIS — R251 Tremor, unspecified: Secondary | ICD-10-CM

## 2024-05-15 DIAGNOSIS — F411 Generalized anxiety disorder: Secondary | ICD-10-CM | POA: Diagnosis not present

## 2024-05-15 DIAGNOSIS — K219 Gastro-esophageal reflux disease without esophagitis: Secondary | ICD-10-CM

## 2024-05-15 DIAGNOSIS — N3281 Overactive bladder: Secondary | ICD-10-CM

## 2024-05-15 DIAGNOSIS — E039 Hypothyroidism, unspecified: Secondary | ICD-10-CM | POA: Diagnosis not present

## 2024-05-15 DIAGNOSIS — F0150 Vascular dementia without behavioral disturbance: Secondary | ICD-10-CM

## 2024-05-15 NOTE — Progress Notes (Unsigned)
 Location:  [facility] Room Number: 146-P Place of Service:  SNF [phone]) Provider:  Charlanne Fredia CROME, MD  Patient Care Team: Charlanne Fredia CROME, MD as PCP - General (Internal Medicine) Nahser, Aleene PARAS, MD (Inactive) as PCP - Cardiology (Cardiology) Rosalie Kitchens, MD as Consulting Physician (Gastroenterology) Onita Duos, MD as Consulting Physician (Neurology) Colon Shove, MD as Consulting Physician (Neurosurgery) [place], Amy, MD as Consulting Physician (Dermatology) Cleotilde Ronal RAMAN, MD as Consulting Physician (Gynecology) [place], Heather, OD (Optometry) Charlanne Fredia CROME, MD (Internal Medicine)  Extended Emergency Contact Information Primary Emergency Contact: Kunath,Robert J Address: [address]          [place], [place] [address] [place] Home Phone: [phone] Work Phone: [phone] Mobile Phone: [phone] Relation: Spouse Secondary Emergency Contact: Rea Mickey Lamar PARAS  [place] Home Phone: [phone] Mobile Phone: [phone] Relation: Son  Code Status:  DNR/hospice Goals of care: Advanced Directive information    04/20/2024   11:14 AM  Advanced Directives  Does Patient Have a Medical Advance Directive? Yes  Type of Estate agent of [place];Out of facility DNR (pink MOST or yellow form);Living will  Does patient want to make changes to medical advance directive? No - Patient declined  Copy of Healthcare Power of Attorney in Chart? Yes - validated most recent copy scanned in chart (See row information)  Pre-existing out of facility DNR order (yellow form or pink MOST form) Pink MOST/Yellow Form most recent copy in chart - Physician notified to receive inpatient order     Chief Complaint  Patient presents with   Medical Management of Chronic Issues    HPI:  Pt is a 83 y.o. female seen today for medical management of chronic diseases.   Lives in [place] in SNF   Patient has h/o   Vascular dementia Worsening Cognition Enrolled in hospice Chronic Pain in  neck and back pain Depression and Anxiety S/p CVA in 2018  Essential Tremor, Hypothyroidism, Hyperlipidemia,  S/P L4-5, L5-S1 decompressive laminectomy with foraminotomies due to Severe Back pain Done on 06/08/21   Continues to be stable No New Nursing issues Stays in her Wheelchair  Needs Xanax  for Anxiety  No Falls Has Gained some weight Wt Readings from Last 3 Encounters:  05/15/24 160 lb (72.6 kg)  03/01/24 150 lb (68 kg)  02/03/24 150 lb (68 kg)    Past Medical History:  Diagnosis Date   Anxiety    Arthritis    Biceps tendonitis 01/05/2013   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 Street of back injections by Dr. Letha 2016 with good relief, history of cervical DD with possiblity of surgery by Dr. Malcolm.   GERD (gastroesophageal reflux disease)    IBS with moderate refluc seen on upper GI study form January 2011 Dr. Rosalie   HTN (hypertension)    no meds   Hyperlipidemia    Hypothyroidism    IBS (irritable bowel syndrome)    Imbalance    Impingement syndrome of right shoulder 01/05/2013   Mass of left side of neck    Memory loss    Mood disorder (HCC)    MVP (mitral valve prolapse)    Hx of    Osteopenia    Peripheral edema    Right  rotator cuff tear 01/05/2013   Sciatic pain    recurrent   Seasonal allergies    Stroke [facility])    Tension headache    TIA (transient ischemic attack)    Tremor    Past Surgical History:  Procedure Laterality Date   ANTERIOR CERVICAL DECOMP/DISCECTOMY FUSION  04/2015    Dr. Samule, [facility]   CARDIAC CATHETERIZATION  2004   which revealed smooth and normal coronary arteries   CARPAL TUNNEL RELEASE  2000   lt   CATARACT EXTRACTION W/ INTRAOCULAR LENS  IMPLANT, BILATERAL   1/17   Dr. Meridee   COSMETIC SURGERY  2010   eyes-facial   DENTAL SURGERY  1960   LASIK     LOOP RECORDER INSERTION N/A 09/22/2017   Procedure: LOOP RECORDER INSERTION;  Surgeon: Inocencio Soyla Lunger, MD;  Location: [facility] INVASIVE CV LAB;  Service: Cardiovascular;  Laterality: N/A;   LUMBAR LAMINECTOMY/DECOMPRESSION MICRODISCECTOMY N/A 06/08/2021   Procedure: Laminectomy and Foraminotomy - L4-L5, L5-S1;  Surgeon: Louis Shove, MD;  Location: [facility] OR;  Service: Neurosurgery;  Laterality: N/A;   SHOULDER ARTHROSCOPY WITH ROTATOR CUFF REPAIR Right 01/05/2013   Procedure: SHOULDER ARTHROSCOPY WITH ARTHROSCOPIC  ROTATOR CUFF REPAIR, ACROMIOPLASTY, EXTENSIVE DEBRIDEMENT;  Surgeon: Fonda SHAUNNA Olmsted, MD;  Location: [facility];  Service: Orthopedics;  Laterality: Right;   TEE WITHOUT CARDIOVERSION N/A 09/22/2017   Procedure: TRANSESOPHAGEAL ECHOCARDIOGRAM (TEE);  Surgeon: Francyne Headland, MD;  Location: [facility] ENDOSCOPY;  Service: Cardiovascular;  Laterality: N/A;   TONSILLECTOMY     TUBAL LIGATION      Allergies  Allergen Reactions   Fluticasone Other (See Comments)    DRY EYES   Aciphex [Rabeprazole] Other (See Comments)    Critical    Codeine Nausea Only   Codeine Other (See Comments)    unknown   Cymbalta [Duloxetine Hcl] Other (See Comments)    Critical    Lexapro [Escitalopram] Other (See Comments)    critical   Lipitor [Atorvastatin  Calcium ] Other (See Comments)    Intolerant    Morphine  Sulfate     Per matrix   Pneumovax [Pneumococcal Polysaccharide Vaccine] Other (See Comments)    moderate   Prevacid [Lansoprazole] Other (See Comments)    Unknown, listed on MAR   Rosuvastatin  Other (See Comments)    Intolerant    Sertraline  Other (See Comments)    critical   Statins Other (See Comments)    Critical    Sulfa Antibiotics Nausea And Vomiting   Tetracyclines & Related Other (See Comments)    Morphine  Sulfate   Welchol [Colesevelam] Other (See Comments)     Unknown, listed on MAR   Pneumovax 23 [Pneumococcal Vac Polyvalent] Rash    Injection site reaction    Outpatient Encounter Medications as of 05/15/2024  Medication Sig   ALPRAZolam  (XANAX ) 0.25 MG tablet Take 1 tablet (0.25 mg total) by mouth 2 (two) times daily.   aspirin  81 MG chewable tablet Chew 81 mg by mouth daily.   divalproex (DEPAKOTE SPRINKLE) 125 MG capsule Take 125 mg by mouth at bedtime.   gabapentin  (NEURONTIN ) 100 MG capsule Take 100 mg by mouth 2 (two) times daily.   levothyroxine  (SYNTHROID ) 75 MCG tablet Take 75 mcg by mouth daily before breakfast.   memantine (NAMENDA) 5 MG tablet Take 5 mg by mouth 2 (two) times daily.   omeprazole  (PRILOSEC) 40 MG capsule Take 40 mg by mouth daily.   oxybutynin  (DITROPAN ) 5 MG tablet Take 1 tablet (5 mg total) by mouth  daily.   polyethylene glycol (MIRALAX ) 17 g packet Take 17 g by mouth daily.   propranolol  (INDERAL ) 20 MG tablet Take 20 mg by mouth 2 (two) times daily. 1 tablet (20 mg) oral, Twice A Day   acetaminophen  (TYLENOL ) 325 MG tablet Take 650 mg by mouth every 6 (six) hours as needed.   ALPRAZolam  (XANAX ) 0.25 MG tablet Take 1 tablet (0.25 mg total) by mouth 2 (two) times daily as needed for anxiety.   diclofenac Sodium (VOLTAREN) 1 % GEL Apply 2 g topically 2 (two) times daily as needed (pain).   No facility-administered encounter medications on file as of 05/15/2024.    Review of Systems  Unable to perform ROS: Dementia    Immunization History  Administered Date(s) Administered   Fluad Quad(high Dose 65+) 07/02/2022   Fluad Trivalent(High Dose 65+) 07/19/2023   Influenza, High Dose Seasonal PF 07/23/2017, 07/03/2019   Influenza-Unspecified 05/27/2010, 08/03/2010, 07/22/2011, 07/01/2012, 06/22/2013, 07/18/2020, 07/11/2021, 07/02/2022, 07/19/2023   Janssen (J&J) SARS-COV-2 Vaccination 11/06/2019   Moderna Covid-19 Vaccine  Bivalent Booster 5yrs & up 07/19/2023   Moderna SARS-COV2 Booster Vaccination 08/05/2021    Moderna Sars-Covid-2 Vaccination 08/07/2020   PFIZER(Purple Top)SARS-COV-2 Vaccination 10/09/2019, 08/07/2020   Pfizer Covid-19 Vaccine Bivalent Booster 58yrs & up 01/21/2023   Pneumococcal Conjugate-13 02/08/2015   Pneumococcal Polysaccharide-23 05/28/2006, 10/13/2011   Pneumococcal-Unspecified 10/17/2013   Td 03/29/2002   Tdap 09/27/2001, 04/25/2002, 08/18/2012, 10/17/2013, 02/07/2024   Zoster Recombinant(Shingrix) 05/23/2021, 07/23/2021   Zoster, Live 08/30/2006   Zoster, Unspecified 08/30/2006   Pertinent  Health Maintenance Due  Topic Date Due   INFLUENZA VACCINE  04/27/2024   DEXA SCAN  Completed      08/07/2022   11:17 AM 08/07/2022   11:03 PM 08/08/2022    8:00 PM 08/09/2022    8:00 AM 08/17/2022   10:59 AM  Fall Risk  Falls in the past year?     1  Was there an injury with Fall?     1  Fall Risk Category Calculator     3  Fall Risk Category (Retired)     High   (RETIRED) Patient Fall Risk Level High fall risk  High fall risk  High fall risk  High fall risk  High fall risk   Patient at Risk for Falls Due to     History of fall(s);Impaired balance/gait;Impaired mobility  Fall risk Follow up     Falls evaluation completed      Data saved with a previous flowsheet row definition   Functional Status Survey:    Vitals:   05/15/24 1302 05/15/24 1303  BP: (!) 142/69 (!) 146/97  Pulse: 63   Temp: (!) 97.3 F (36.3 C)   SpO2: 95%   Weight: 160 lb (72.6 kg)   Height: 5' 1 (1.549 m)    Body mass index is 30.23 kg/m. Physical Exam Vitals reviewed.  Constitutional:      Appearance: Normal appearance.  HENT:     Head: Normocephalic.     Nose: Nose normal.     Mouth/Throat:     Mouth: Mucous membranes are moist.     Pharynx: Oropharynx is clear.  Eyes:     Pupils: Pupils are equal, round, and reactive to light.  Cardiovascular:     Rate and Rhythm: Normal rate and regular rhythm.     Pulses: Normal pulses.     Heart sounds: Normal heart sounds. No murmur  heard. Pulmonary:     Effort: Pulmonary effort is normal.  Breath sounds: Normal breath sounds.  Abdominal:     General: Abdomen is flat. Bowel sounds are normal.     Palpations: Abdomen is soft.  Musculoskeletal:        General: No swelling.     Cervical back: Neck supple.  Skin:    General: Skin is warm.  Neurological:     General: No focal deficit present.     Mental Status: She is alert.     Comments: Pleasantly confused Has Aphasia    Psychiatric:        Mood and Affect: Mood normal.        Thought Content: Thought content normal.     Labs reviewed: No results for input(s): NA, K, CL, CO2, GLUCOSE, BUN, CREATININE, CALCIUM , MG, PHOS in the last 8760 hours. No results for input(s): AST, ALT, ALKPHOS, BILITOT, PROT, ALBUMIN in the last 8760 hours. No results for input(s): WBC, NEUTROABS, HGB, HCT, MCV, PLT in the last 8760 hours. Lab Results  Component Value Date   TSH 0.76 10/13/2023   Lab Results  Component Value Date   HGBA1C 5.6 09/21/2017   Lab Results  Component Value Date   CHOL 174 03/23/2022   HDL 66 03/23/2022   LDLCALC 81 03/23/2022   TRIG 134 03/23/2022   CHOLHDL 2.1 09/21/2017    Significant Diagnostic Results in last 30 days:  No results found.  Assessment/Plan 1. Vascular dementia With Anxiety Full care for ADLS Namenda which helps with her behaviors also Depakote and Xanax  for behaviors Also on Aspirin   2. Hypothyroidism, unspecified type TSH normal in 1/25  3. Generalized anxiety disorder Xanax  and Depakote  4. Tremor Inderal   5. Overactive bladder Ditropan   6. Gastroesophageal reflux disease without esophagitis Prilosec 7 Chronic Back Pain On Gabapentin    Family/ staff Communication:   Labs/tests ordered:

## 2024-05-16 DIAGNOSIS — I672 Cerebral atherosclerosis: Secondary | ICD-10-CM | POA: Diagnosis not present

## 2024-05-16 DIAGNOSIS — R634 Abnormal weight loss: Secondary | ICD-10-CM | POA: Diagnosis not present

## 2024-05-16 DIAGNOSIS — F0153 Vascular dementia, unspecified severity, with mood disturbance: Secondary | ICD-10-CM | POA: Diagnosis not present

## 2024-05-16 DIAGNOSIS — I69318 Other symptoms and signs involving cognitive functions following cerebral infarction: Secondary | ICD-10-CM | POA: Diagnosis not present

## 2024-05-16 DIAGNOSIS — I10 Essential (primary) hypertension: Secondary | ICD-10-CM | POA: Diagnosis not present

## 2024-05-16 DIAGNOSIS — F0154 Vascular dementia, unspecified severity, with anxiety: Secondary | ICD-10-CM | POA: Diagnosis not present

## 2024-05-23 DIAGNOSIS — I69318 Other symptoms and signs involving cognitive functions following cerebral infarction: Secondary | ICD-10-CM | POA: Diagnosis not present

## 2024-05-23 DIAGNOSIS — I672 Cerebral atherosclerosis: Secondary | ICD-10-CM | POA: Diagnosis not present

## 2024-05-23 DIAGNOSIS — F0153 Vascular dementia, unspecified severity, with mood disturbance: Secondary | ICD-10-CM | POA: Diagnosis not present

## 2024-05-23 DIAGNOSIS — F0154 Vascular dementia, unspecified severity, with anxiety: Secondary | ICD-10-CM | POA: Diagnosis not present

## 2024-05-23 DIAGNOSIS — I10 Essential (primary) hypertension: Secondary | ICD-10-CM | POA: Diagnosis not present

## 2024-05-23 DIAGNOSIS — R634 Abnormal weight loss: Secondary | ICD-10-CM | POA: Diagnosis not present

## 2024-05-26 DIAGNOSIS — I10 Essential (primary) hypertension: Secondary | ICD-10-CM | POA: Diagnosis not present

## 2024-05-26 DIAGNOSIS — F0154 Vascular dementia, unspecified severity, with anxiety: Secondary | ICD-10-CM | POA: Diagnosis not present

## 2024-05-26 DIAGNOSIS — I672 Cerebral atherosclerosis: Secondary | ICD-10-CM | POA: Diagnosis not present

## 2024-05-26 DIAGNOSIS — I69318 Other symptoms and signs involving cognitive functions following cerebral infarction: Secondary | ICD-10-CM | POA: Diagnosis not present

## 2024-05-26 DIAGNOSIS — F0153 Vascular dementia, unspecified severity, with mood disturbance: Secondary | ICD-10-CM | POA: Diagnosis not present

## 2024-05-26 DIAGNOSIS — R634 Abnormal weight loss: Secondary | ICD-10-CM | POA: Diagnosis not present

## 2024-05-28 DIAGNOSIS — K219 Gastro-esophageal reflux disease without esophagitis: Secondary | ICD-10-CM | POA: Diagnosis not present

## 2024-05-28 DIAGNOSIS — E039 Hypothyroidism, unspecified: Secondary | ICD-10-CM | POA: Diagnosis not present

## 2024-05-28 DIAGNOSIS — F0153 Vascular dementia, unspecified severity, with mood disturbance: Secondary | ICD-10-CM | POA: Diagnosis not present

## 2024-05-28 DIAGNOSIS — I69318 Other symptoms and signs involving cognitive functions following cerebral infarction: Secondary | ICD-10-CM | POA: Diagnosis not present

## 2024-05-28 DIAGNOSIS — F0154 Vascular dementia, unspecified severity, with anxiety: Secondary | ICD-10-CM | POA: Diagnosis not present

## 2024-05-28 DIAGNOSIS — M48061 Spinal stenosis, lumbar region without neurogenic claudication: Secondary | ICD-10-CM | POA: Diagnosis not present

## 2024-05-28 DIAGNOSIS — R634 Abnormal weight loss: Secondary | ICD-10-CM | POA: Diagnosis not present

## 2024-05-28 DIAGNOSIS — S2243XD Multiple fractures of ribs, bilateral, subsequent encounter for fracture with routine healing: Secondary | ICD-10-CM | POA: Diagnosis not present

## 2024-05-28 DIAGNOSIS — Z9181 History of falling: Secondary | ICD-10-CM | POA: Diagnosis not present

## 2024-05-28 DIAGNOSIS — I10 Essential (primary) hypertension: Secondary | ICD-10-CM | POA: Diagnosis not present

## 2024-05-28 DIAGNOSIS — I672 Cerebral atherosclerosis: Secondary | ICD-10-CM | POA: Diagnosis not present

## 2024-05-28 DIAGNOSIS — E785 Hyperlipidemia, unspecified: Secondary | ICD-10-CM | POA: Diagnosis not present

## 2024-05-29 DIAGNOSIS — I69318 Other symptoms and signs involving cognitive functions following cerebral infarction: Secondary | ICD-10-CM | POA: Diagnosis not present

## 2024-05-29 DIAGNOSIS — R634 Abnormal weight loss: Secondary | ICD-10-CM | POA: Diagnosis not present

## 2024-05-29 DIAGNOSIS — I10 Essential (primary) hypertension: Secondary | ICD-10-CM | POA: Diagnosis not present

## 2024-05-29 DIAGNOSIS — F0153 Vascular dementia, unspecified severity, with mood disturbance: Secondary | ICD-10-CM | POA: Diagnosis not present

## 2024-05-29 DIAGNOSIS — I672 Cerebral atherosclerosis: Secondary | ICD-10-CM | POA: Diagnosis not present

## 2024-05-29 DIAGNOSIS — F0154 Vascular dementia, unspecified severity, with anxiety: Secondary | ICD-10-CM | POA: Diagnosis not present

## 2024-05-30 DIAGNOSIS — I10 Essential (primary) hypertension: Secondary | ICD-10-CM | POA: Diagnosis not present

## 2024-05-30 DIAGNOSIS — F0154 Vascular dementia, unspecified severity, with anxiety: Secondary | ICD-10-CM | POA: Diagnosis not present

## 2024-05-30 DIAGNOSIS — I69318 Other symptoms and signs involving cognitive functions following cerebral infarction: Secondary | ICD-10-CM | POA: Diagnosis not present

## 2024-05-30 DIAGNOSIS — F0153 Vascular dementia, unspecified severity, with mood disturbance: Secondary | ICD-10-CM | POA: Diagnosis not present

## 2024-05-30 DIAGNOSIS — R634 Abnormal weight loss: Secondary | ICD-10-CM | POA: Diagnosis not present

## 2024-05-30 DIAGNOSIS — I672 Cerebral atherosclerosis: Secondary | ICD-10-CM | POA: Diagnosis not present

## 2024-06-06 DIAGNOSIS — I69318 Other symptoms and signs involving cognitive functions following cerebral infarction: Secondary | ICD-10-CM | POA: Diagnosis not present

## 2024-06-06 DIAGNOSIS — I10 Essential (primary) hypertension: Secondary | ICD-10-CM | POA: Diagnosis not present

## 2024-06-06 DIAGNOSIS — F0154 Vascular dementia, unspecified severity, with anxiety: Secondary | ICD-10-CM | POA: Diagnosis not present

## 2024-06-06 DIAGNOSIS — F0153 Vascular dementia, unspecified severity, with mood disturbance: Secondary | ICD-10-CM | POA: Diagnosis not present

## 2024-06-06 DIAGNOSIS — R634 Abnormal weight loss: Secondary | ICD-10-CM | POA: Diagnosis not present

## 2024-06-06 DIAGNOSIS — I672 Cerebral atherosclerosis: Secondary | ICD-10-CM | POA: Diagnosis not present

## 2024-06-13 DIAGNOSIS — R634 Abnormal weight loss: Secondary | ICD-10-CM | POA: Diagnosis not present

## 2024-06-13 DIAGNOSIS — I69318 Other symptoms and signs involving cognitive functions following cerebral infarction: Secondary | ICD-10-CM | POA: Diagnosis not present

## 2024-06-13 DIAGNOSIS — F0154 Vascular dementia, unspecified severity, with anxiety: Secondary | ICD-10-CM | POA: Diagnosis not present

## 2024-06-13 DIAGNOSIS — I10 Essential (primary) hypertension: Secondary | ICD-10-CM | POA: Diagnosis not present

## 2024-06-13 DIAGNOSIS — F0153 Vascular dementia, unspecified severity, with mood disturbance: Secondary | ICD-10-CM | POA: Diagnosis not present

## 2024-06-13 DIAGNOSIS — I672 Cerebral atherosclerosis: Secondary | ICD-10-CM | POA: Diagnosis not present

## 2024-06-14 ENCOUNTER — Encounter: Payer: Self-pay | Admitting: Adult Health

## 2024-06-14 ENCOUNTER — Non-Acute Institutional Stay (SKILLED_NURSING_FACILITY): Payer: Self-pay | Admitting: Adult Health

## 2024-06-14 DIAGNOSIS — I10 Essential (primary) hypertension: Secondary | ICD-10-CM

## 2024-06-14 DIAGNOSIS — M48062 Spinal stenosis, lumbar region with neurogenic claudication: Secondary | ICD-10-CM

## 2024-06-14 DIAGNOSIS — R635 Abnormal weight gain: Secondary | ICD-10-CM | POA: Insufficient documentation

## 2024-06-14 DIAGNOSIS — E039 Hypothyroidism, unspecified: Secondary | ICD-10-CM

## 2024-06-14 DIAGNOSIS — F411 Generalized anxiety disorder: Secondary | ICD-10-CM

## 2024-06-14 DIAGNOSIS — R251 Tremor, unspecified: Secondary | ICD-10-CM | POA: Diagnosis not present

## 2024-06-14 DIAGNOSIS — F0150 Vascular dementia without behavioral disturbance: Secondary | ICD-10-CM | POA: Diagnosis not present

## 2024-06-14 DIAGNOSIS — N3281 Overactive bladder: Secondary | ICD-10-CM | POA: Diagnosis not present

## 2024-06-14 NOTE — Assessment & Plan Note (Addendum)
 BNP normal Likely due to meds and sedentary lifestyle Monitor for now

## 2024-06-14 NOTE — Assessment & Plan Note (Signed)
 Chronic pain is controlled with tylenol  and gabapentin 

## 2024-06-14 NOTE — Assessment & Plan Note (Signed)
-  On levothyroxine

## 2024-06-14 NOTE — Assessment & Plan Note (Signed)
 No current issues on ditropan 

## 2024-06-14 NOTE — Assessment & Plan Note (Signed)
 Not currently on meds Bp slightly elevated but given her goals of care would not treat aggressively

## 2024-06-14 NOTE — Progress Notes (Signed)
 Location:  Medical illustrator of Service:  SNF (31) hospice  Provider:  Darlean Maus, NP    Patient Care Team: Charlanne Fredia CROME, MD as PCP - General (Internal Medicine) Nahser, Aleene PARAS, MD (Inactive) as PCP - Cardiology (Cardiology) Rosalie Kitchens, MD as Consulting Physician (Gastroenterology) Onita Duos, MD as Consulting Physician (Neurology) Colon Shove, MD as Consulting Physician (Neurosurgery) [place], Amy, MD as Consulting Physician (Dermatology) Cleotilde Ronal RAMAN, MD as Consulting Physician (Gynecology) [place], Heather, OD (Optometry) Charlanne Fredia CROME, MD (Internal Medicine)  Extended Emergency Contact Information Primary Emergency Contact: Gilkes,Robert J Address: [address]          [place], [place] [address] [place] Home Phone: [phone] Work Phone: [phone] Mobile Phone: [phone] Relation: Spouse Secondary Emergency Contact: Rea Mickey Lamar PARAS  [place] Home Phone: [phone] Mobile Phone: [phone] Relation: Son  Code Status:  DNR Goals of care: Advanced Directive information    04/20/2024   11:14 AM  Advanced Directives  Does Patient Have a Medical Advance Directive? Yes  Type of Estate agent of [place];Out of facility DNR (pink MOST or yellow form);Living will  Does patient want to make changes to medical advance directive? No - Patient declined  Copy of Healthcare Power of Attorney in Chart? Yes - validated most recent copy scanned in chart (See row information)  Pre-existing out of facility DNR order (yellow form or pink MOST form) Pink MOST/Yellow Form most recent copy in chart - Physician notified to receive inpatient order     Chief Complaint  Patient presents with   Medical Management of Chronic Issues    HPI:  Pt is a 83 y.o. female seen today for medical management of chronic diseases.    Resides in skilled care  Enrolled in hospice care due to progressive  dementia and chronic pain related issues. Needs assistance with all ADLs. Able to communicate needs but needs cuing and prompting. Spends most of the time in her WC. Progression in memory loss. Sleeping more.   Anxiety: has periods of anxiety and is using xanax  schedule and prn which is charted as effective. Also  on depakote by hospice due to agitation in the afternoon hours.  Vascular dementia: Hx of TIA off plavix  per hospice, now on asa.   Off aricept  due to weight loss. On namenda MMSE 13/30 08/13/23.    Has chronic back and hip pain. Prior hx of laminectomy 2022. March 2024 MRI of hips Tear of the left gluteus minimus tendon High grade parital near complete tear of the right gluteus minimus tendon insertion  Also has a hx of sciatica with prior surgery and injections.  Currently on neurontin  BID. No reports of pain.  Hypothyroidism: on levothyroxine   Lab Results  Component Value Date   TSH 0.76 10/13/2023   Constipation: bowels moving on miralax .   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 Street of back injections by Dr. Letha 2016 with good relief, history of cervical DD  with possiblity of surgery by Dr. Malcolm.   GERD (gastroesophageal reflux disease)    IBS with moderate refluc seen on upper GI study form January 2011 Dr. Rosalie   HTN (hypertension)    no meds   Hyperlipidemia    Hypothyroidism    IBS (irritable bowel syndrome)    Imbalance    Impingement syndrome of right shoulder 01/05/2013   Mass of left side of neck    Memory loss    Mood  disorder (HCC)    MVP (mitral valve prolapse)    Hx of    Osteopenia    Peripheral edema    Right rotator cuff tear 01/05/2013   Sciatic pain    recurrent   Seasonal allergies    Stroke [facility])    Tension headache    TIA (transient ischemic attack)    Tremor    Past Surgical History:  Procedure Laterality Date   ANTERIOR CERVICAL DECOMP/DISCECTOMY FUSION  04/2015    Dr. Samule, [facility]   CARDIAC CATHETERIZATION  2004   which revealed smooth and normal coronary arteries   CARPAL TUNNEL RELEASE  2000   lt   CATARACT EXTRACTION W/ INTRAOCULAR LENS  IMPLANT, BILATERAL  1/17   Dr. Meridee   COSMETIC SURGERY  2010   eyes-facial   DENTAL SURGERY  1960   LASIK     LOOP RECORDER INSERTION N/A 09/22/2017   Procedure: LOOP RECORDER INSERTION;  Surgeon: Inocencio Soyla Lunger, MD;  Location: [facility] INVASIVE CV LAB;  Service: Cardiovascular;  Laterality: N/A;   LUMBAR LAMINECTOMY/DECOMPRESSION MICRODISCECTOMY N/A 06/08/2021   Procedure: Laminectomy and Foraminotomy - L4-L5, L5-S1;  Surgeon: Louis Shove, MD;  Location: [facility] OR;  Service: Neurosurgery;  Laterality: N/A;   SHOULDER ARTHROSCOPY WITH ROTATOR CUFF REPAIR Right 01/05/2013   Procedure: SHOULDER ARTHROSCOPY WITH ARTHROSCOPIC  ROTATOR CUFF REPAIR, ACROMIOPLASTY, EXTENSIVE DEBRIDEMENT;  Surgeon: Fonda SHAUNNA Olmsted, MD;  Location:  SURGERY CENTER;  Service: Orthopedics;  Laterality: Right;   TEE WITHOUT CARDIOVERSION N/A 09/22/2017   Procedure: TRANSESOPHAGEAL ECHOCARDIOGRAM (TEE);  Surgeon: Francyne Headland, MD;  Location: [facility] ENDOSCOPY;  Service: Cardiovascular;  Laterality: N/A;   TONSILLECTOMY     TUBAL LIGATION      Allergies  Allergen Reactions   Fluticasone Other (See Comments)    DRY EYES   Aciphex [Rabeprazole] Other (See Comments)    Critical    Codeine Nausea Only   Codeine Other (See Comments)    unknown   Cymbalta [Duloxetine Hcl] Other (See Comments)    Critical    Lexapro [Escitalopram] Other (See Comments)     critical   Lipitor [Atorvastatin  Calcium ] Other (See Comments)    Intolerant    Morphine  Sulfate     Per matrix   Pneumovax [Pneumococcal Polysaccharide Vaccine] Other (See Comments)    moderate   Prevacid [Lansoprazole] Other (See Comments)    Unknown, listed on MAR   Rosuvastatin  Other (See Comments)    Intolerant    Sertraline  Other (See Comments)    critical   Statins Other (See Comments)    Critical    Sulfa Antibiotics Nausea And Vomiting   Tetracyclines & Related Other (See Comments)    Morphine  Sulfate   Welchol [Colesevelam] Other (See Comments)    Unknown, listed on MAR   Pneumovax 23 [Pneumococcal Vac Polyvalent] Rash    Injection site reaction    Outpatient Encounter Medications as of 06/14/2024  Medication Sig   acetaminophen  (TYLENOL ) 325 MG tablet Take 650 mg by mouth  every 6 (six) hours as needed.   ALPRAZolam  (XANAX ) 0.25 MG tablet Take 1 tablet (0.25 mg total) by mouth 2 (two) times daily as needed for anxiety.   ALPRAZolam  (XANAX ) 0.25 MG tablet Take 1 tablet (0.25 mg total) by mouth 2 (two) times daily.   aspirin  81 MG chewable tablet Chew 81 mg by mouth daily.   diclofenac Sodium (VOLTAREN) 1 % GEL Apply 2 g topically 2 (two) times daily as needed (pain).   divalproex (DEPAKOTE SPRINKLE) 125 MG capsule Take 125 mg by mouth at bedtime.   gabapentin  (NEURONTIN ) 100 MG capsule Take 100 mg by mouth 2 (two) times daily.   levothyroxine  (SYNTHROID ) 75 MCG tablet Take 75 mcg by mouth daily before breakfast.   memantine (NAMENDA) 5 MG tablet Take 5 mg by mouth 2 (two) times daily.   omeprazole  (PRILOSEC) 40 MG capsule Take 40 mg by mouth daily.   oxybutynin  (DITROPAN ) 5 MG tablet Take 1 tablet (5 mg total) by mouth daily.   polyethylene glycol (MIRALAX ) 17 g packet Take 17 g by mouth daily.   propranolol  (INDERAL ) 20 MG tablet Take 20 mg by mouth 2 (two) times daily. 1 tablet (20 mg) oral, Twice A Day   No facility-administered encounter medications on file as of  06/14/2024.    Review of Systems  Unable to perform ROS: Dementia    Immunization History  Administered Date(s) Administered   Fluad Quad(high Dose 65+) 07/02/2022   Fluad Trivalent(High Dose 65+) 07/19/2023   INFLUENZA, HIGH DOSE SEASONAL PF 07/23/2017, 07/03/2019   Influenza-Unspecified 05/27/2010, 08/03/2010, 07/22/2011, 07/01/2012, 06/22/2013, 07/18/2020, 07/11/2021, 07/02/2022, 07/19/2023   Janssen (J&J) SARS-COV-2 Vaccination 11/06/2019   Moderna Covid-19 Vaccine  Bivalent Booster 26yrs & up 07/19/2023   Moderna SARS-COV2 Booster Vaccination 08/05/2021   Moderna Sars-Covid-2 Vaccination 08/07/2020   PFIZER(Purple Top)SARS-COV-2 Vaccination 10/09/2019, 08/07/2020   Pfizer Covid-19 Vaccine Bivalent Booster 2yrs & up 01/21/2023   Pneumococcal Conjugate-13 02/08/2015   Pneumococcal Polysaccharide-23 05/28/2006, 10/13/2011   Pneumococcal-Unspecified 10/17/2013   Td 03/29/2002   Tdap 09/27/2001, 04/25/2002, 08/18/2012, 10/17/2013, 02/07/2024   Zoster Recombinant(Shingrix) 05/23/2021, 07/23/2021   Zoster, Live 08/30/2006   Zoster, Unspecified 08/30/2006   Pertinent  Health Maintenance Due  Topic Date Due   Influenza Vaccine  04/27/2024   DEXA SCAN  Completed      08/07/2022   11:17 AM 08/07/2022   11:03 PM 08/08/2022    8:00 PM 08/09/2022    8:00 AM 08/17/2022   10:59 AM  Fall Risk  Falls in the past year?     1  Was there an injury with Fall?     1  Fall Risk Category Calculator     3  Fall Risk Category (Retired)     High   (RETIRED) Patient Fall Risk Level High fall risk  High fall risk  High fall risk  High fall risk  High fall risk   Patient at Risk for Falls Due to     History of fall(s);Impaired balance/gait;Impaired mobility  Fall risk Follow up     Falls evaluation completed      Data saved with a previous flowsheet row definition   Functional Status Survey:    Vitals:   06/14/24 1535  BP: (!) 145/79  Pulse: 61  Resp: 16  Temp: 97.6 F (36.4 C)   SpO2: 95%  Weight: 161 lb 9.6 oz (73.3 kg)   Body mass index is 30.53 kg/m. Wt Readings from Last 3 Encounters:  06/14/24 161 lb 9.6 oz (  73.3 kg)  05/15/24 160 lb (72.6 kg)  03/01/24 150 lb (68 kg)    Physical Exam Vitals reviewed.  Constitutional:      General: She is not in acute distress.    Appearance: She is not diaphoretic.  HENT:     Head: Normocephalic and atraumatic.     Nose: Nose normal.     Mouth/Throat:     Mouth: Mucous membranes are moist.     Pharynx: Oropharynx is clear.  Eyes:     Conjunctiva/sclera: Conjunctivae normal.     Pupils: Pupils are equal, round, and reactive to light.  Neck:     Vascular: No JVD.  Cardiovascular:     Rate and Rhythm: Normal rate and regular rhythm.     Heart sounds: No murmur heard. Pulmonary:     Effort: Pulmonary effort is normal. No respiratory distress.     Breath sounds: No wheezing.     Comments: Decreased bases Abdominal:     General: Bowel sounds are normal. There is no distension.     Palpations: Abdomen is soft.     Tenderness: There is no abdominal tenderness.  Musculoskeletal:     Cervical back: No rigidity or tenderness.     Right lower leg: Edema present.     Left lower leg: Edema present.  Lymphadenopathy:     Cervical: No cervical adenopathy.  Skin:    General: Skin is warm and dry.  Neurological:     General: No focal deficit present.     Mental Status: She is alert. Mental status is at baseline.     Labs reviewed: No results for input(s): NA, K, CL, CO2, GLUCOSE, BUN, CREATININE, CALCIUM , MG, PHOS in the last 8760 hours. No results for input(s): AST, ALT, ALKPHOS, BILITOT, PROT, ALBUMIN in the last 8760 hours. No results for input(s): WBC, NEUTROABS, HGB, HCT, MCV, PLT in the last 8760 hours. Lab Results  Component Value Date   TSH 0.76 10/13/2023   Lab Results  Component Value Date   HGBA1C 5.6 09/21/2017   Lab Results  Component Value Date    CHOL 174 03/23/2022   HDL 66 03/23/2022   LDLCALC 81 03/23/2022   TRIG 134 03/23/2022   CHOLHDL 2.1 09/21/2017    Significant Diagnostic Results in last 30 days:  No results found.  Assessment/Plan  Vascular dementia without behavioral disturbance (HCC) Progressive decline in cognition and physical function c/w the disease. Continue supportive care in the skilled environment. On namenda On asa   Tremor Controlled with propranolol    Overactive bladder No current issues on ditropan    Generalized anxiety disorder Using depakote and prn xanax  Mood is stable   Essential hypertension Not currently on meds Bp slightly elevated but given her goals of care would not treat aggressively  Hypothyroidism On levothyroxine   Lumbar stenosis with neurogenic claudication Chronic pain is controlled with tylenol  and gabapentin   Weight gain BNP normal Likely due to meds and sedentary lifestyle Monitor for now      Labs/tests ordered: NA

## 2024-06-14 NOTE — Assessment & Plan Note (Signed)
 Using depakote and prn xanax  Mood is stable

## 2024-06-14 NOTE — Assessment & Plan Note (Signed)
 Progressive decline in cognition and physical function c/w the disease. Continue supportive care in the skilled environment. On namenda On asa

## 2024-06-14 NOTE — Assessment & Plan Note (Signed)
Controlled with propranolol.

## 2024-06-20 DIAGNOSIS — I10 Essential (primary) hypertension: Secondary | ICD-10-CM | POA: Diagnosis not present

## 2024-06-20 DIAGNOSIS — I69318 Other symptoms and signs involving cognitive functions following cerebral infarction: Secondary | ICD-10-CM | POA: Diagnosis not present

## 2024-06-20 DIAGNOSIS — I672 Cerebral atherosclerosis: Secondary | ICD-10-CM | POA: Diagnosis not present

## 2024-06-20 DIAGNOSIS — F0154 Vascular dementia, unspecified severity, with anxiety: Secondary | ICD-10-CM | POA: Diagnosis not present

## 2024-06-20 DIAGNOSIS — F0153 Vascular dementia, unspecified severity, with mood disturbance: Secondary | ICD-10-CM | POA: Diagnosis not present

## 2024-06-20 DIAGNOSIS — R634 Abnormal weight loss: Secondary | ICD-10-CM | POA: Diagnosis not present

## 2024-06-24 DIAGNOSIS — R634 Abnormal weight loss: Secondary | ICD-10-CM | POA: Diagnosis not present

## 2024-06-24 DIAGNOSIS — F0154 Vascular dementia, unspecified severity, with anxiety: Secondary | ICD-10-CM | POA: Diagnosis not present

## 2024-06-24 DIAGNOSIS — I672 Cerebral atherosclerosis: Secondary | ICD-10-CM | POA: Diagnosis not present

## 2024-06-24 DIAGNOSIS — F0153 Vascular dementia, unspecified severity, with mood disturbance: Secondary | ICD-10-CM | POA: Diagnosis not present

## 2024-06-24 DIAGNOSIS — I10 Essential (primary) hypertension: Secondary | ICD-10-CM | POA: Diagnosis not present

## 2024-06-24 DIAGNOSIS — I69318 Other symptoms and signs involving cognitive functions following cerebral infarction: Secondary | ICD-10-CM | POA: Diagnosis not present

## 2024-06-26 DIAGNOSIS — Z23 Encounter for immunization: Secondary | ICD-10-CM | POA: Diagnosis not present

## 2024-06-27 DIAGNOSIS — E039 Hypothyroidism, unspecified: Secondary | ICD-10-CM | POA: Diagnosis not present

## 2024-06-27 DIAGNOSIS — F0153 Vascular dementia, unspecified severity, with mood disturbance: Secondary | ICD-10-CM | POA: Diagnosis not present

## 2024-06-27 DIAGNOSIS — M48061 Spinal stenosis, lumbar region without neurogenic claudication: Secondary | ICD-10-CM | POA: Diagnosis not present

## 2024-06-27 DIAGNOSIS — F0154 Vascular dementia, unspecified severity, with anxiety: Secondary | ICD-10-CM | POA: Diagnosis not present

## 2024-06-27 DIAGNOSIS — E785 Hyperlipidemia, unspecified: Secondary | ICD-10-CM | POA: Diagnosis not present

## 2024-06-27 DIAGNOSIS — R634 Abnormal weight loss: Secondary | ICD-10-CM | POA: Diagnosis not present

## 2024-06-27 DIAGNOSIS — S2243XD Multiple fractures of ribs, bilateral, subsequent encounter for fracture with routine healing: Secondary | ICD-10-CM | POA: Diagnosis not present

## 2024-06-27 DIAGNOSIS — K219 Gastro-esophageal reflux disease without esophagitis: Secondary | ICD-10-CM | POA: Diagnosis not present

## 2024-06-27 DIAGNOSIS — Z9181 History of falling: Secondary | ICD-10-CM | POA: Diagnosis not present

## 2024-06-27 DIAGNOSIS — I672 Cerebral atherosclerosis: Secondary | ICD-10-CM | POA: Diagnosis not present

## 2024-06-27 DIAGNOSIS — I10 Essential (primary) hypertension: Secondary | ICD-10-CM | POA: Diagnosis not present

## 2024-06-27 DIAGNOSIS — I69318 Other symptoms and signs involving cognitive functions following cerebral infarction: Secondary | ICD-10-CM | POA: Diagnosis not present

## 2024-07-04 DIAGNOSIS — R634 Abnormal weight loss: Secondary | ICD-10-CM | POA: Diagnosis not present

## 2024-07-04 DIAGNOSIS — I672 Cerebral atherosclerosis: Secondary | ICD-10-CM | POA: Diagnosis not present

## 2024-07-04 DIAGNOSIS — F0154 Vascular dementia, unspecified severity, with anxiety: Secondary | ICD-10-CM | POA: Diagnosis not present

## 2024-07-04 DIAGNOSIS — F0153 Vascular dementia, unspecified severity, with mood disturbance: Secondary | ICD-10-CM | POA: Diagnosis not present

## 2024-07-04 DIAGNOSIS — I69318 Other symptoms and signs involving cognitive functions following cerebral infarction: Secondary | ICD-10-CM | POA: Diagnosis not present

## 2024-07-04 DIAGNOSIS — I10 Essential (primary) hypertension: Secondary | ICD-10-CM | POA: Diagnosis not present

## 2024-07-11 DIAGNOSIS — I10 Essential (primary) hypertension: Secondary | ICD-10-CM | POA: Diagnosis not present

## 2024-07-11 DIAGNOSIS — I69318 Other symptoms and signs involving cognitive functions following cerebral infarction: Secondary | ICD-10-CM | POA: Diagnosis not present

## 2024-07-11 DIAGNOSIS — I672 Cerebral atherosclerosis: Secondary | ICD-10-CM | POA: Diagnosis not present

## 2024-07-11 DIAGNOSIS — F0153 Vascular dementia, unspecified severity, with mood disturbance: Secondary | ICD-10-CM | POA: Diagnosis not present

## 2024-07-11 DIAGNOSIS — R634 Abnormal weight loss: Secondary | ICD-10-CM | POA: Diagnosis not present

## 2024-07-11 DIAGNOSIS — F0154 Vascular dementia, unspecified severity, with anxiety: Secondary | ICD-10-CM | POA: Diagnosis not present

## 2024-07-16 ENCOUNTER — Non-Acute Institutional Stay (SKILLED_NURSING_FACILITY): Payer: Self-pay | Admitting: Adult Health

## 2024-07-16 ENCOUNTER — Encounter: Payer: Self-pay | Admitting: Adult Health

## 2024-07-16 DIAGNOSIS — F411 Generalized anxiety disorder: Secondary | ICD-10-CM

## 2024-07-16 DIAGNOSIS — R251 Tremor, unspecified: Secondary | ICD-10-CM

## 2024-07-16 DIAGNOSIS — E039 Hypothyroidism, unspecified: Secondary | ICD-10-CM

## 2024-07-16 DIAGNOSIS — F0150 Vascular dementia without behavioral disturbance: Secondary | ICD-10-CM | POA: Diagnosis not present

## 2024-07-16 DIAGNOSIS — R5383 Other fatigue: Secondary | ICD-10-CM | POA: Diagnosis not present

## 2024-07-16 DIAGNOSIS — N3281 Overactive bladder: Secondary | ICD-10-CM | POA: Diagnosis not present

## 2024-07-16 DIAGNOSIS — K219 Gastro-esophageal reflux disease without esophagitis: Secondary | ICD-10-CM

## 2024-07-16 DIAGNOSIS — M48062 Spinal stenosis, lumbar region with neurogenic claudication: Secondary | ICD-10-CM | POA: Diagnosis not present

## 2024-07-16 NOTE — Assessment & Plan Note (Signed)
 ON PPI therapy Hx of IBS also has seen GI No current issues.

## 2024-07-16 NOTE — Assessment & Plan Note (Signed)
-  On levothyroxine

## 2024-07-16 NOTE — Assessment & Plan Note (Signed)
 Progressive decline in cognition and physical function c/w the disease. Continue supportive care in the skilled environment. On namenda On asa

## 2024-07-16 NOTE — Assessment & Plan Note (Signed)
 -  Continue propranolol

## 2024-07-16 NOTE — Assessment & Plan Note (Signed)
 Chronic pain is controlled with tylenol  and gabapentin 

## 2024-07-16 NOTE — Assessment & Plan Note (Signed)
 No current issues on ditropan 

## 2024-07-16 NOTE — Assessment & Plan Note (Addendum)
 Using depakote and xanax  Mood is stable

## 2024-07-16 NOTE — Progress Notes (Signed)
 Location:  [facility] Room Number: 146 P Place of Service:  SNF [phone]) Provider:  Tawni America, NP   Patient Care Team: Charlanne Fredia CROME, MD as PCP - General (Internal Medicine) Nahser, Aleene PARAS, MD (Inactive) as PCP - Cardiology (Cardiology) Rosalie Kitchens, MD as Consulting Physician (Gastroenterology) Onita Duos, MD as Consulting Physician (Neurology) Colon Shove, MD as Consulting Physician (Neurosurgery) [place], Amy, MD as Consulting Physician (Dermatology) Cleotilde Ronal RAMAN, MD as Consulting Physician (Gynecology) [place], Heather, OD (Optometry) Charlanne Fredia CROME, MD (Internal Medicine)  Extended Emergency Contact Information Primary Emergency Contact: Mccutchan,Robert J Address: [address]          [place], [place] [address] [place] Home Phone: [phone] Work Phone: [phone] Mobile Phone: [phone] Relation: Spouse Secondary Emergency Contact: Rea Mickey Lamar PARAS  [place] Home Phone: [phone] Mobile Phone: [phone] Relation: Son  Code Status: DNR Hospice  Goals of care: Advanced Directive information    04/20/2024   11:14 AM  Advanced Directives  Does Patient Have a Medical Advance Directive? Yes  Type of Estate agent of [place];Out of facility DNR (pink MOST or yellow form);Living will  Does patient want to make changes to medical advance directive? No - Patient declined  Copy of Healthcare Power of Attorney in Chart? Yes - validated most recent copy scanned in chart (See row information)  Pre-existing out of facility DNR order (yellow form or pink MOST form) Pink MOST/Yellow Form most recent copy in chart - Physician notified to receive inpatient order     Chief Complaint  Patient presents with   Routine Visit    HPI:  Pt is a 83 y.o. female seen today for medical management of chronic diseases.    Here for routine follow up Resides in skilled care and is  followed by hospice due to dementia She is reporting not feeling well. Mild sore throat and tired.  No cough, congestion etc  Enrolled in hospice care due to progressive dementia and chronic pain related issues. Needs assistance with all ADLs. Able to communicate needs but needs cuing and prompting. Spends most of the time in her WC. Progression in memory loss. Sleeping more.   Anxiety: has periods of anxiety and is using xanax  schedule and prn which is charted as effective. Also  on depakote by hospice due to agitation in the afternoon hours.  Vascular dementia: Hx of TIA off plavix  per hospice, now on asa.   Off aricept  due to weight loss. On namenda MMSE 13/30 08/13/23.    Has chronic back and hip pain. Prior hx of laminectomy 2022. March 2024 MRI of hips Tear of the left gluteus minimus tendon High grade parital near complete tear of the right gluteus minimus tendon insertion  Also has a hx of sciatica with prior surgery and injections.  Currently on neurontin  BID. No reports of pain.  Hypothyroidism: on levothyroxine   Lab Results  Component Value Date   TSH 0.76 10/13/2023   Constipation: bowels moving on miralax .   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 Street of back injections by Dr. Letha 2016 with good relief, history of cervical DD with possiblity of surgery by Dr. Malcolm.   GERD (gastroesophageal reflux disease)    IBS with moderate refluc seen on upper GI study form January 2011 Dr. Rosalie   HTN (hypertension)    no meds   Hyperlipidemia    Hypothyroidism    IBS (irritable  bowel syndrome)    Imbalance    Impingement syndrome of right shoulder 01/05/2013   Mass of left side of neck    Memory loss    Mood disorder    MVP (mitral valve prolapse)    Hx of    Osteopenia    Peripheral edema    Right rotator cuff tear 01/05/2013   Sciatic pain    recurrent   Seasonal allergies    Stroke [facility])    Tension headache    TIA (transient ischemic attack)    Tremor    Past Surgical History:  Procedure Laterality Date   ANTERIOR CERVICAL DECOMP/DISCECTOMY FUSION  04/2015    Dr. Samule, [facility]   CARDIAC CATHETERIZATION  2004   which revealed smooth and normal coronary arteries   CARPAL TUNNEL RELEASE  2000   lt   CATARACT EXTRACTION W/ INTRAOCULAR LENS  IMPLANT, BILATERAL  1/17   Dr. Meridee   COSMETIC SURGERY  2010   eyes-facial   DENTAL SURGERY  1960   LASIK     LOOP RECORDER INSERTION N/A 09/22/2017   Procedure: LOOP RECORDER INSERTION;  Surgeon: Inocencio Soyla Lunger, MD;  Location: [facility] INVASIVE CV LAB;  Service: Cardiovascular;  Laterality: N/A;   LUMBAR LAMINECTOMY/DECOMPRESSION MICRODISCECTOMY N/A 06/08/2021   Procedure: Laminectomy and Foraminotomy - L4-L5, L5-S1;  Surgeon: Louis Shove, MD;  Location: [facility] OR;  Service: Neurosurgery;  Laterality: N/A;   SHOULDER ARTHROSCOPY WITH ROTATOR CUFF REPAIR Right 01/05/2013   Procedure: SHOULDER ARTHROSCOPY WITH ARTHROSCOPIC  ROTATOR CUFF REPAIR, ACROMIOPLASTY, EXTENSIVE DEBRIDEMENT;  Surgeon: Fonda SHAUNNA Olmsted, MD;  Location: [facility];  Service: Orthopedics;  Laterality: Right;   TEE WITHOUT CARDIOVERSION N/A 09/22/2017   Procedure: TRANSESOPHAGEAL ECHOCARDIOGRAM (TEE);  Surgeon: Francyne Headland, MD;  Location: [facility] ENDOSCOPY;  Service: Cardiovascular;  Laterality: N/A;   TONSILLECTOMY     TUBAL LIGATION      Allergies  Allergen Reactions   Fluticasone Other (See Comments)    DRY EYES   Aciphex [Rabeprazole] Other (See Comments)    Critical    Codeine Nausea Only   Codeine Other (See  Comments)    unknown   Cymbalta [Duloxetine Hcl] Other (See Comments)    Critical    Lexapro [Escitalopram] Other (See Comments)    critical   Lipitor [Atorvastatin  Calcium ] Other (See Comments)    Intolerant    Morphine  Sulfate     Per matrix   Pneumovax [Pneumococcal Polysaccharide Vaccine] Other (See Comments)    moderate   Prevacid [Lansoprazole] Other (See Comments)    Unknown, listed on MAR   Rosuvastatin  Other (See Comments)    Intolerant    Sertraline  Other (See Comments)    critical   Statins Other (See Comments)    Critical    Sulfa Antibiotics Nausea And Vomiting   Tetracyclines & Related Other (See Comments)    Morphine  Sulfate   Welchol [Colesevelam] Other (See Comments)    Unknown, listed on MAR   Pneumovax 23 [Pneumococcal Vac Polyvalent] Rash    Injection site reaction  Outpatient Encounter Medications as of 07/16/2024  Medication Sig   acetaminophen  (TYLENOL ) 325 MG tablet Take 650 mg by mouth every 6 (six) hours as needed.   ALPRAZolam  (XANAX ) 0.25 MG tablet Take 1 tablet (0.25 mg total) by mouth 2 (two) times daily as needed for anxiety.   ALPRAZolam  (XANAX ) 0.25 MG tablet Take 1 tablet (0.25 mg total) by mouth 2 (two) times daily.   aspirin  81 MG chewable tablet Chew 81 mg by mouth daily.   diclofenac Sodium (VOLTAREN) 1 % GEL Apply 4 g topically every 8 (eight) hours as needed (pain).   divalproex (DEPAKOTE SPRINKLE) 125 MG capsule Take 125 mg by mouth at bedtime.   gabapentin  (NEURONTIN ) 100 MG capsule Take 100 mg by mouth 2 (two) times daily.   levothyroxine  (SYNTHROID ) 75 MCG tablet Take 75 mcg by mouth daily before breakfast.   memantine (NAMENDA) 5 MG tablet Take 5 mg by mouth 2 (two) times daily.   omeprazole  (PRILOSEC) 40 MG capsule Take 40 mg by mouth daily.   oxybutynin  (DITROPAN ) 5 MG tablet Take 1 tablet (5 mg total) by mouth daily.   polyethylene glycol (MIRALAX ) 17 g packet Take 17 g by mouth daily.   propranolol  (INDERAL ) 20 MG tablet  Take 20 mg by mouth 2 (two) times daily. 1 tablet (20 mg) oral, Twice A Day   No facility-administered encounter medications on file as of 07/16/2024.    Review of Systems  Constitutional:  Positive for fatigue. Negative for activity change, appetite change, chills, diaphoresis and fever.  HENT:  Positive for sore throat. Negative for congestion, ear pain, postnasal drip, rhinorrhea and trouble swallowing.   Respiratory:  Negative for cough, shortness of breath and wheezing.   Cardiovascular:  Negative for chest pain and leg swelling.  Gastrointestinal:  Negative for abdominal distention, constipation, diarrhea and vomiting.  Genitourinary:  Negative for difficulty urinating, flank pain, frequency and pelvic pain.  Musculoskeletal:  Positive for gait problem. Negative for arthralgias and back pain.  Skin:  Negative for rash and wound.  Psychiatric/Behavioral:  Positive for confusion. Negative for agitation and behavioral problems.     Immunization History  Administered Date(s) Administered   Fluad Quad(high Dose 65+) 07/02/2022   Fluad Trivalent(High Dose 65+) 07/19/2023   INFLUENZA, HIGH DOSE SEASONAL PF 07/23/2017, 07/03/2019   Influenza-Unspecified 05/27/2010, 08/03/2010, 07/22/2011, 07/01/2012, 06/22/2013, 07/18/2020, 07/11/2021, 07/02/2022, 07/19/2023, 06/26/2024   Janssen (J&J) SARS-COV-2 Vaccination 11/06/2019   Moderna Covid-19 Vaccine  Bivalent Booster 62yrs & up 07/19/2023   Moderna SARS-COV2 Booster Vaccination 08/05/2021   Moderna Sars-Covid-2 Vaccination 08/07/2020   PFIZER(Purple Top)SARS-COV-2 Vaccination 10/09/2019, 08/07/2020   Pfizer Covid-19 Vaccine Bivalent Booster 46yrs & up 01/21/2023   Pneumococcal Conjugate-13 02/08/2015   Pneumococcal Polysaccharide-23 05/28/2006, 10/13/2011   Pneumococcal-Unspecified 10/17/2013   Td 03/29/2002   Tdap 09/27/2001, 04/25/2002, 08/18/2012, 10/17/2013, 02/07/2024   Unspecified SARS-COV-2 Vaccination 06/26/2024   Zoster  Recombinant(Shingrix) 05/23/2021, 07/23/2021   Zoster, Live 08/30/2006   Zoster, Unspecified 08/30/2006   Pertinent  Health Maintenance Due  Topic Date Due   Influenza Vaccine  Completed   DEXA SCAN  Completed      08/07/2022   11:17 AM 08/07/2022   11:03 PM 08/08/2022    8:00 PM 08/09/2022    8:00 AM 08/17/2022   10:59 AM  Fall Risk  Falls in the past year?     1  Was there an injury with Fall?     1  Fall Risk Category Calculator     3  Fall  Risk Category (Retired)     High   (RETIRED) Patient Fall Risk Level High fall risk  High fall risk  High fall risk  High fall risk  High fall risk   Patient at Risk for Falls Due to     History of fall(s);Impaired balance/gait;Impaired mobility  Fall risk Follow up     Falls evaluation completed      Data saved with a previous flowsheet row definition   Functional Status Survey:    Vitals:   07/16/24 1102  BP: (!) 156/69  Pulse: 65  Resp: 16  Temp: (!) 97.5 F (36.4 C)  SpO2: 96%  Weight: 166 lb (75.3 kg)  Height: 5' 1 (1.549 m)   Body mass index is 31.37 kg/m. Physical Exam Vitals and nursing note reviewed.  Constitutional:      Appearance: Normal appearance.     Comments: [place] arouses  HENT:     Head: Normocephalic and atraumatic.     Nose: Nose normal.  Eyes:     Conjunctiva/sclera: Conjunctivae normal.     Pupils: Pupils are equal, round, and reactive to light.  Cardiovascular:     Rate and Rhythm: Normal rate and regular rhythm.     Heart sounds: No murmur heard. Pulmonary:     Effort: Pulmonary effort is normal. No respiratory distress.     Breath sounds: Normal breath sounds. No wheezing.  Abdominal:     General: Bowel sounds are normal. There is no distension.     Palpations: Abdomen is soft.     Tenderness: There is no abdominal tenderness.  Musculoskeletal:     Cervical back: No rigidity.     Right lower leg: No edema.     Left lower leg: No edema.  Lymphadenopathy:     Cervical: Cervical  adenopathy present.  Skin:    General: Skin is warm and dry.  Neurological:     General: No focal deficit present.     Mental Status: Mental status is at baseline.  Psychiatric:        Mood and Affect: Mood normal.     Labs reviewed: No results for input(s): NA, K, CL, CO2, GLUCOSE, BUN, CREATININE, CALCIUM , MG, PHOS in the last 8760 hours. No results for input(s): AST, ALT, ALKPHOS, BILITOT, PROT, ALBUMIN in the last 8760 hours. No results for input(s): WBC, NEUTROABS, HGB, HCT, MCV, PLT in the last 8760 hours. Lab Results  Component Value Date   TSH 0.76 10/13/2023   Lab Results  Component Value Date   HGBA1C 5.6 09/21/2017   Lab Results  Component Value Date   CHOL 174 03/23/2022   HDL 66 03/23/2022   LDLCALC 81 03/23/2022   TRIG 134 03/23/2022   CHOLHDL 2.1 09/21/2017    Significant Diagnostic Results in last 30 days:  No results found.  Assessment/Plan  Vascular dementia without behavioral disturbance (HCC) Progressive decline in cognition and physical function c/w the disease. Continue supportive care in the skilled environment. On namenda On asa   Overactive bladder No current issues on ditropan    Hypothyroidism On levothyroxine   Generalized anxiety disorder Using depakote and xanax  Mood is stable   Lumbar stenosis with neurogenic claudication Chronic pain is controlled with tylenol  and gabapentin   Gastroesophageal reflux disease without esophagitis ON PPI therapy Hx of IBS also has seen GI No current issues.   Tremor Continue propranolol     Fatigue and sore throat Covid and flu swab  Family/ staff Communication: discussed with her nurse.  Labs/tests ordered:  Covid and flu swab done and resulted negative.

## 2024-07-18 DIAGNOSIS — F0153 Vascular dementia, unspecified severity, with mood disturbance: Secondary | ICD-10-CM | POA: Diagnosis not present

## 2024-07-18 DIAGNOSIS — F0154 Vascular dementia, unspecified severity, with anxiety: Secondary | ICD-10-CM | POA: Diagnosis not present

## 2024-07-18 DIAGNOSIS — I69318 Other symptoms and signs involving cognitive functions following cerebral infarction: Secondary | ICD-10-CM | POA: Diagnosis not present

## 2024-07-18 DIAGNOSIS — I672 Cerebral atherosclerosis: Secondary | ICD-10-CM | POA: Diagnosis not present

## 2024-07-18 DIAGNOSIS — I10 Essential (primary) hypertension: Secondary | ICD-10-CM | POA: Diagnosis not present

## 2024-07-18 DIAGNOSIS — R634 Abnormal weight loss: Secondary | ICD-10-CM | POA: Diagnosis not present

## 2024-07-19 DIAGNOSIS — I672 Cerebral atherosclerosis: Secondary | ICD-10-CM | POA: Diagnosis not present

## 2024-07-19 DIAGNOSIS — F0153 Vascular dementia, unspecified severity, with mood disturbance: Secondary | ICD-10-CM | POA: Diagnosis not present

## 2024-07-19 DIAGNOSIS — I10 Essential (primary) hypertension: Secondary | ICD-10-CM | POA: Diagnosis not present

## 2024-07-19 DIAGNOSIS — I69318 Other symptoms and signs involving cognitive functions following cerebral infarction: Secondary | ICD-10-CM | POA: Diagnosis not present

## 2024-07-19 DIAGNOSIS — R634 Abnormal weight loss: Secondary | ICD-10-CM | POA: Diagnosis not present

## 2024-07-19 DIAGNOSIS — F0154 Vascular dementia, unspecified severity, with anxiety: Secondary | ICD-10-CM | POA: Diagnosis not present

## 2024-07-25 DIAGNOSIS — I10 Essential (primary) hypertension: Secondary | ICD-10-CM | POA: Diagnosis not present

## 2024-07-25 DIAGNOSIS — I672 Cerebral atherosclerosis: Secondary | ICD-10-CM | POA: Diagnosis not present

## 2024-07-25 DIAGNOSIS — R634 Abnormal weight loss: Secondary | ICD-10-CM | POA: Diagnosis not present

## 2024-07-25 DIAGNOSIS — F0153 Vascular dementia, unspecified severity, with mood disturbance: Secondary | ICD-10-CM | POA: Diagnosis not present

## 2024-07-25 DIAGNOSIS — F0154 Vascular dementia, unspecified severity, with anxiety: Secondary | ICD-10-CM | POA: Diagnosis not present

## 2024-07-25 DIAGNOSIS — I69318 Other symptoms and signs involving cognitive functions following cerebral infarction: Secondary | ICD-10-CM | POA: Diagnosis not present

## 2024-07-27 DIAGNOSIS — I10 Essential (primary) hypertension: Secondary | ICD-10-CM | POA: Diagnosis not present

## 2024-07-27 DIAGNOSIS — R634 Abnormal weight loss: Secondary | ICD-10-CM | POA: Diagnosis not present

## 2024-07-27 DIAGNOSIS — I672 Cerebral atherosclerosis: Secondary | ICD-10-CM | POA: Diagnosis not present

## 2024-07-27 DIAGNOSIS — I69318 Other symptoms and signs involving cognitive functions following cerebral infarction: Secondary | ICD-10-CM | POA: Diagnosis not present

## 2024-07-27 DIAGNOSIS — F0153 Vascular dementia, unspecified severity, with mood disturbance: Secondary | ICD-10-CM | POA: Diagnosis not present

## 2024-07-27 DIAGNOSIS — F0154 Vascular dementia, unspecified severity, with anxiety: Secondary | ICD-10-CM | POA: Diagnosis not present

## 2024-07-28 DIAGNOSIS — F0153 Vascular dementia, unspecified severity, with mood disturbance: Secondary | ICD-10-CM | POA: Diagnosis not present

## 2024-07-28 DIAGNOSIS — E785 Hyperlipidemia, unspecified: Secondary | ICD-10-CM | POA: Diagnosis not present

## 2024-07-28 DIAGNOSIS — R634 Abnormal weight loss: Secondary | ICD-10-CM | POA: Diagnosis not present

## 2024-07-28 DIAGNOSIS — I672 Cerebral atherosclerosis: Secondary | ICD-10-CM | POA: Diagnosis not present

## 2024-07-28 DIAGNOSIS — Z9181 History of falling: Secondary | ICD-10-CM | POA: Diagnosis not present

## 2024-07-28 DIAGNOSIS — F0154 Vascular dementia, unspecified severity, with anxiety: Secondary | ICD-10-CM | POA: Diagnosis not present

## 2024-07-28 DIAGNOSIS — I10 Essential (primary) hypertension: Secondary | ICD-10-CM | POA: Diagnosis not present

## 2024-07-28 DIAGNOSIS — E039 Hypothyroidism, unspecified: Secondary | ICD-10-CM | POA: Diagnosis not present

## 2024-07-28 DIAGNOSIS — K219 Gastro-esophageal reflux disease without esophagitis: Secondary | ICD-10-CM | POA: Diagnosis not present

## 2024-07-28 DIAGNOSIS — I69318 Other symptoms and signs involving cognitive functions following cerebral infarction: Secondary | ICD-10-CM | POA: Diagnosis not present

## 2024-07-28 DIAGNOSIS — M48061 Spinal stenosis, lumbar region without neurogenic claudication: Secondary | ICD-10-CM | POA: Diagnosis not present

## 2024-07-28 DIAGNOSIS — S2243XD Multiple fractures of ribs, bilateral, subsequent encounter for fracture with routine healing: Secondary | ICD-10-CM | POA: Diagnosis not present

## 2024-08-02 DIAGNOSIS — I10 Essential (primary) hypertension: Secondary | ICD-10-CM | POA: Diagnosis not present

## 2024-08-02 DIAGNOSIS — I69318 Other symptoms and signs involving cognitive functions following cerebral infarction: Secondary | ICD-10-CM | POA: Diagnosis not present

## 2024-08-02 DIAGNOSIS — F0153 Vascular dementia, unspecified severity, with mood disturbance: Secondary | ICD-10-CM | POA: Diagnosis not present

## 2024-08-02 DIAGNOSIS — I672 Cerebral atherosclerosis: Secondary | ICD-10-CM | POA: Diagnosis not present

## 2024-08-02 DIAGNOSIS — F0154 Vascular dementia, unspecified severity, with anxiety: Secondary | ICD-10-CM | POA: Diagnosis not present

## 2024-08-02 DIAGNOSIS — R634 Abnormal weight loss: Secondary | ICD-10-CM | POA: Diagnosis not present

## 2024-08-08 DIAGNOSIS — F0154 Vascular dementia, unspecified severity, with anxiety: Secondary | ICD-10-CM | POA: Diagnosis not present

## 2024-08-08 DIAGNOSIS — R634 Abnormal weight loss: Secondary | ICD-10-CM | POA: Diagnosis not present

## 2024-08-08 DIAGNOSIS — I10 Essential (primary) hypertension: Secondary | ICD-10-CM | POA: Diagnosis not present

## 2024-08-08 DIAGNOSIS — F0153 Vascular dementia, unspecified severity, with mood disturbance: Secondary | ICD-10-CM | POA: Diagnosis not present

## 2024-08-08 DIAGNOSIS — I672 Cerebral atherosclerosis: Secondary | ICD-10-CM | POA: Diagnosis not present

## 2024-08-08 DIAGNOSIS — I69318 Other symptoms and signs involving cognitive functions following cerebral infarction: Secondary | ICD-10-CM | POA: Diagnosis not present

## 2024-08-15 DIAGNOSIS — I69318 Other symptoms and signs involving cognitive functions following cerebral infarction: Secondary | ICD-10-CM | POA: Diagnosis not present

## 2024-08-15 DIAGNOSIS — F0154 Vascular dementia, unspecified severity, with anxiety: Secondary | ICD-10-CM | POA: Diagnosis not present

## 2024-08-15 DIAGNOSIS — I10 Essential (primary) hypertension: Secondary | ICD-10-CM | POA: Diagnosis not present

## 2024-08-15 DIAGNOSIS — F0153 Vascular dementia, unspecified severity, with mood disturbance: Secondary | ICD-10-CM | POA: Diagnosis not present

## 2024-08-15 DIAGNOSIS — R634 Abnormal weight loss: Secondary | ICD-10-CM | POA: Diagnosis not present

## 2024-08-15 DIAGNOSIS — I672 Cerebral atherosclerosis: Secondary | ICD-10-CM | POA: Diagnosis not present

## 2024-08-20 ENCOUNTER — Encounter: Payer: Self-pay | Admitting: Internal Medicine

## 2024-08-20 ENCOUNTER — Non-Acute Institutional Stay (SKILLED_NURSING_FACILITY): Payer: Self-pay | Admitting: Internal Medicine

## 2024-08-20 DIAGNOSIS — E039 Hypothyroidism, unspecified: Secondary | ICD-10-CM

## 2024-08-20 DIAGNOSIS — F0150 Vascular dementia without behavioral disturbance: Secondary | ICD-10-CM

## 2024-08-20 DIAGNOSIS — N3281 Overactive bladder: Secondary | ICD-10-CM

## 2024-08-20 DIAGNOSIS — K219 Gastro-esophageal reflux disease without esophagitis: Secondary | ICD-10-CM

## 2024-08-20 DIAGNOSIS — M48062 Spinal stenosis, lumbar region with neurogenic claudication: Secondary | ICD-10-CM

## 2024-08-20 DIAGNOSIS — I10 Essential (primary) hypertension: Secondary | ICD-10-CM | POA: Diagnosis not present

## 2024-08-20 DIAGNOSIS — R251 Tremor, unspecified: Secondary | ICD-10-CM | POA: Diagnosis not present

## 2024-08-20 NOTE — Progress Notes (Signed)
 Location:  [facility] Service:  SNF ([id number])  Provider:   Code Status: DNR/hospice Goals of Care:     04/20/2024   11:14 AM  Advanced Directives  Does Patient Have a Medical Advance Directive? Yes  Type of Estate Agent of [place];Out of facility DNR (pink MOST or yellow form);Living will  Does patient want to make changes to medical advance directive? No - Patient declined  Copy of Healthcare Power of Attorney in Chart? Yes - validated most recent copy scanned in chart (See row information)  Pre-existing out of facility DNR order (yellow form or pink MOST form) Pink MOST/Yellow Form most recent copy in chart - Physician notified to receive inpatient order     Chief Complaint  Patient presents with   Care Management    HPI: Patient is a 83 y.o. female seen today for medical management of chronic diseases.   Lives in [place] in SNF   Patient has h/o  Vascular dementia Worsening Cognition Enrolled in hospice Chronic Pain in  neck and back pain Depression and Anxiety S/p CVA in 2018  Essential Tremor, Hypothyroidism, Hyperlipidemia,  S/P L4-5, L5-S1 decompressive laminectomy with foraminotomies due to Severe Back pain Done on 06/08/21    She is stable. No new Nursing issues. 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 Street of back injections by Dr. Letha 2016 with good relief, history of cervical DD  with possiblity of surgery by Dr. Malcolm.   GERD (gastroesophageal reflux disease)    IBS with moderate refluc seen on upper GI study form January 2011 Dr. Rosalie   HTN (hypertension)    no meds   Hyperlipidemia    Hypothyroidism    IBS (irritable bowel syndrome)    Imbalance    Impingement syndrome of right shoulder 01/05/2013   Mass of left side of neck    Memory loss    Mood disorder    MVP (mitral valve prolapse)    Hx of    Osteopenia    Peripheral edema    Right rotator cuff tear 01/05/2013   Sciatic pain    recurrent   Seasonal allergies    Stroke [facility])    Tension headache    TIA (transient ischemic attack)    Tremor     Past Surgical History:  Procedure Laterality Date   ANTERIOR CERVICAL DECOMP/DISCECTOMY FUSION  04/2015    Dr. Samule, [facility]   CARDIAC CATHETERIZATION  2004   which revealed smooth and normal coronary arteries   CARPAL TUNNEL RELEASE  2000   lt   CATARACT EXTRACTION W/ INTRAOCULAR LENS  IMPLANT, BILATERAL  1/17   Dr. Meridee   COSMETIC SURGERY  2010   eyes-facial   DENTAL SURGERY  1960   LASIK     LOOP RECORDER INSERTION N/A 09/22/2017   Procedure: LOOP RECORDER INSERTION;  Surgeon: Inocencio Soyla Lunger, MD;  Location: [facility] INVASIVE CV LAB;  Service: Cardiovascular;  Laterality: N/A;   LUMBAR LAMINECTOMY/DECOMPRESSION MICRODISCECTOMY N/A 06/08/2021   Procedure: Laminectomy and Foraminotomy - L4-L5, L5-S1;  Surgeon: Louis Shove, MD;  Location: [facility] OR;  Service: Neurosurgery;  Laterality: N/A;   SHOULDER ARTHROSCOPY WITH ROTATOR CUFF REPAIR Right 01/05/2013   Procedure: SHOULDER ARTHROSCOPY WITH ARTHROSCOPIC  ROTATOR CUFF REPAIR, ACROMIOPLASTY, EXTENSIVE DEBRIDEMENT;  Surgeon: Fonda SHAUNNA Olmsted, MD;  Location: [facility];  Service: Orthopedics;  Laterality: Right;   TEE WITHOUT CARDIOVERSION N/A 09/22/2017   Procedure: TRANSESOPHAGEAL ECHOCARDIOGRAM (TEE);  Surgeon: Francyne Headland, MD;  [facility]: [facility] ENDOSCOPY;  Service: Cardiovascular;   Laterality: N/A;   TONSILLECTOMY     TUBAL LIGATION      Allergies  Allergen Reactions   Fluticasone Other (See Comments)    DRY EYES   Aciphex [Rabeprazole] Other (See Comments)    Critical    Codeine Nausea Only   Codeine Other (See Comments)    unknown   Cymbalta [Duloxetine Hcl] Other (See Comments)    Critical    Lexapro [Escitalopram] Other (See Comments)    critical   Lipitor [Atorvastatin  Calcium ] Other (See Comments)    Intolerant    Morphine  Sulfate     Per matrix   Pneumovax [Pneumococcal Polysaccharide Vaccine] Other (See Comments)    moderate   Prevacid [Lansoprazole] Other (See Comments)    Unknown, listed on MAR   Rosuvastatin  Other (See Comments)    Intolerant    Sertraline  Other (See Comments)    critical   Statins Other (See Comments)    Critical    Sulfa Antibiotics Nausea And Vomiting   Tetracyclines & Related Other (See Comments)    Morphine  Sulfate   Welchol [Colesevelam] Other (See Comments)    Unknown, listed on MAR   Pneumovax 23 [Pneumococcal Vac Polyvalent] Rash    Injection site reaction    Outpatient Encounter Medications as of 08/20/2024  Medication Sig   acetaminophen  (TYLENOL ) 325 MG tablet Take 650 mg by mouth every 6 (six) hours as needed.   ALPRAZolam  (XANAX ) 0.25 MG tablet Take 1 tablet (0.25 mg total) by mouth 2 (two) times daily as needed for anxiety.   ALPRAZolam  (XANAX ) 0.25 MG tablet Take 1 tablet (0.25 mg total) by mouth 2 (two) times daily.   aspirin  81 MG chewable tablet Chew 81 mg by mouth daily.   diclofenac Sodium (VOLTAREN) 1 % GEL Apply 4 g topically every 8 (eight) hours as needed (pain).   divalproex (DEPAKOTE SPRINKLE) 125 MG capsule Take 125 mg by mouth at bedtime.   gabapentin  (NEURONTIN ) 100 MG capsule Take 100 mg by mouth 2 (two) times daily.   levothyroxine  (SYNTHROID ) 75 MCG tablet Take 75 mcg by mouth daily before breakfast.   memantine (NAMENDA) 5 MG tablet Take 5 mg by mouth 2 (two) times daily.    omeprazole  (PRILOSEC) 40 MG capsule Take 40 mg by mouth daily.   oxybutynin  (DITROPAN ) 5 MG tablet Take 1 tablet (5 mg total) by mouth daily.   polyethylene glycol (MIRALAX ) 17 g packet Take 17 g by mouth daily.   propranolol  (INDERAL ) 20 MG tablet Take 20 mg by mouth 2 (two) times daily. 1 tablet (20 mg) oral, Twice A Day   No facility-administered encounter medications on file as of 08/20/2024.    Review of Systems:  Review of Systems  Unable to perform ROS: Dementia    Health Maintenance  Topic Date Due  COVID-19 Vaccine (9 - 2025-26 season) 12/24/2024   DTaP/Tdap/Td (7 - Td or Tdap) 02/06/2034   Pneumococcal Vaccine: 50+ Years  Completed   Influenza Vaccine  Completed   Bone Density Scan  Completed   Zoster Vaccines- Shingrix  Completed   Meningococcal B Vaccine  Aged Out    Physical Exam: Vitals:   08/20/24 1432  BP: 121/73  Pulse: 63  Resp: 18  Temp: (!) 97.3 F (36.3 C)  Weight: 166 lb (75.3 kg)   Body mass index is 31.37 kg/m. Physical Exam Vitals reviewed.  Constitutional:      Appearance: Normal appearance.  HENT:     Head: Normocephalic.     Nose: Nose normal.     Mouth/Throat:     Mouth: Mucous membranes are moist.     Pharynx: Oropharynx is clear.  Eyes:     Pupils: Pupils are equal, round, and reactive to light.  Cardiovascular:     Rate and Rhythm: Normal rate and regular rhythm.     Pulses: Normal pulses.     Heart sounds: Normal heart sounds. No murmur heard. Pulmonary:     Effort: Pulmonary effort is normal.     Breath sounds: Normal breath sounds.  Abdominal:     General: Abdomen is flat. Bowel sounds are normal.     Palpations: Abdomen is soft.  Musculoskeletal:        General: No swelling.     Cervical back: Neck supple.  Skin:    General: Skin is warm.  Neurological:     General: No focal deficit present.     Mental Status: She is alert.  Psychiatric:        Mood and Affect: Mood normal.        Thought Content: Thought content  normal.     Labs reviewed: Basic Metabolic Panel: Recent Labs    10/13/23 0000  TSH 0.76   Liver Function Tests: No results for input(s): AST, ALT, ALKPHOS, BILITOT, PROT, ALBUMIN in the last 8760 hours. No results for input(s): LIPASE, AMYLASE in the last 8760 hours. No results for input(s): AMMONIA in the last 8760 hours. CBC: No results for input(s): WBC, NEUTROABS, HGB, HCT, MCV, PLT in the last 8760 hours. Lipid Panel: No results for input(s): CHOL, HDL, LDLCALC, TRIG, CHOLHDL, LDLDIRECT in the last 8760 hours. Lab Results  Component Value Date   HGBA1C 5.6 09/21/2017    Procedures since last visit: No results found.  Assessment/Plan 1. Vascular dementia without behavioral disturbance (HCC) (Primary) Namenda and Depakote for behaviors Xanax  Prn for Anxiety   2. Hypothyroidism, unspecified type Will repeat TSH due to Weight gain  3. Generalized anxiety disorder Xanax  and Depakote  4. Gastroesophageal reflux disease without esophagitis Prilosec  5. Tremor Propanolol  6. Essential hypertension No meds   7. Overactive bladder Ditropan   8. Lumbar stenosis with neurogenic claudication Gabapentin     Labs/tests ordered:  TSH Next appt:  Visit date not found

## 2024-08-21 DIAGNOSIS — E039 Hypothyroidism, unspecified: Secondary | ICD-10-CM | POA: Diagnosis not present

## 2024-08-21 LAB — TSH: TSH: 3.01 (ref 0.41–5.90)

## 2024-08-22 DIAGNOSIS — F0153 Vascular dementia, unspecified severity, with mood disturbance: Secondary | ICD-10-CM | POA: Diagnosis not present

## 2024-08-22 DIAGNOSIS — I10 Essential (primary) hypertension: Secondary | ICD-10-CM | POA: Diagnosis not present

## 2024-08-22 DIAGNOSIS — I672 Cerebral atherosclerosis: Secondary | ICD-10-CM | POA: Diagnosis not present

## 2024-08-22 DIAGNOSIS — R634 Abnormal weight loss: Secondary | ICD-10-CM | POA: Diagnosis not present

## 2024-08-22 DIAGNOSIS — F0154 Vascular dementia, unspecified severity, with anxiety: Secondary | ICD-10-CM | POA: Diagnosis not present

## 2024-08-22 DIAGNOSIS — I69318 Other symptoms and signs involving cognitive functions following cerebral infarction: Secondary | ICD-10-CM | POA: Diagnosis not present

## 2024-08-26 DIAGNOSIS — R634 Abnormal weight loss: Secondary | ICD-10-CM | POA: Diagnosis not present

## 2024-08-26 DIAGNOSIS — I69318 Other symptoms and signs involving cognitive functions following cerebral infarction: Secondary | ICD-10-CM | POA: Diagnosis not present

## 2024-08-26 DIAGNOSIS — I10 Essential (primary) hypertension: Secondary | ICD-10-CM | POA: Diagnosis not present

## 2024-08-26 DIAGNOSIS — F0154 Vascular dementia, unspecified severity, with anxiety: Secondary | ICD-10-CM | POA: Diagnosis not present

## 2024-08-26 DIAGNOSIS — F0153 Vascular dementia, unspecified severity, with mood disturbance: Secondary | ICD-10-CM | POA: Diagnosis not present

## 2024-08-26 DIAGNOSIS — I672 Cerebral atherosclerosis: Secondary | ICD-10-CM | POA: Diagnosis not present

## 2024-09-13 ENCOUNTER — Non-Acute Institutional Stay (SKILLED_NURSING_FACILITY): Payer: Self-pay | Admitting: Adult Health

## 2024-09-13 DIAGNOSIS — E039 Hypothyroidism, unspecified: Secondary | ICD-10-CM | POA: Diagnosis not present

## 2024-09-13 DIAGNOSIS — F0150 Vascular dementia without behavioral disturbance: Secondary | ICD-10-CM

## 2024-09-13 DIAGNOSIS — F411 Generalized anxiety disorder: Secondary | ICD-10-CM | POA: Diagnosis not present

## 2024-09-13 DIAGNOSIS — N3281 Overactive bladder: Secondary | ICD-10-CM | POA: Diagnosis not present

## 2024-09-13 DIAGNOSIS — K219 Gastro-esophageal reflux disease without esophagitis: Secondary | ICD-10-CM | POA: Diagnosis not present

## 2024-09-13 DIAGNOSIS — M48062 Spinal stenosis, lumbar region with neurogenic claudication: Secondary | ICD-10-CM | POA: Diagnosis not present

## 2024-09-13 DIAGNOSIS — R635 Abnormal weight gain: Secondary | ICD-10-CM | POA: Diagnosis not present

## 2024-09-16 ENCOUNTER — Encounter: Payer: Self-pay | Admitting: Adult Health

## 2024-09-16 MED ORDER — POLYETHYLENE GLYCOL 3350 17 G PO PACK: 17.0000 g | PACK | ORAL | Status: AC

## 2024-09-16 MED ORDER — LOPERAMIDE HCL 2 MG PO TABS: 2.0000 mg | ORAL_TABLET | ORAL | Status: AC | PRN

## 2024-09-16 NOTE — Assessment & Plan Note (Signed)
 Progressive decline in cognition and physical function c/w the disease. Continue supportive care in the skilled environment. On namenda On asa  Continue depakote for sundowning.

## 2024-09-16 NOTE — Assessment & Plan Note (Signed)
 No current issues on ditropan 

## 2024-09-16 NOTE — Assessment & Plan Note (Signed)
 Using depakote and xanax  Mood is stable

## 2024-09-16 NOTE — Assessment & Plan Note (Signed)
 ON PPI therapy Hx of IBS also has seen GI No current issues.

## 2024-09-16 NOTE — Progress Notes (Signed)
 " Location:  [facility] of Service:  SNF (31) Provider:  Tawni America, NP   Patient Care Team: Charlanne Fredia CROME, MD as PCP - General (Internal Medicine) Nahser, Aleene PARAS, MD (Inactive) as PCP - Cardiology (Cardiology) Rosalie Kitchens, MD as Consulting Physician (Gastroenterology) Onita Duos, MD as Consulting Physician (Neurology) Colon Shove, MD as Consulting Physician (Neurosurgery) Jordan, Amy, MD as Consulting Physician (Dermatology) Cleotilde Ronal RAMAN, MD as Consulting Physician (Gynecology) Oman, Heather, OD (Optometry) Charlanne Fredia CROME, MD (Internal Medicine)  Extended Emergency Contact Information Primary Emergency Contact: Ishmael,Robert J Address: [address]          [place], [place] [address] [place] Home Phone: [phone] Work Phone: [phone] Mobile Phone: [phone] Relation: Spouse Secondary Emergency Contact: Rea Mickey Lamar PARAS  [place] Home Phone: [phone] Mobile Phone: [phone] Relation: Son  Code Status: DNR Hospice  Goals of care: Advanced Directive information    04/20/2024   11:14 AM  Advanced Directives  Does Patient Have a Medical Advance Directive? Yes  Type of Estate Agent of [place];Out of facility DNR (pink MOST or yellow form);Living will  Does patient want to make changes to medical advance directive? No - Patient declined  Copy of Healthcare Power of Attorney in Chart? Yes - validated most recent copy scanned in chart (See row information)  Pre-existing out of facility DNR order (yellow form or pink MOST form) Pink MOST/Yellow Form most recent copy in chart - Physician notified to receive inpatient order     Chief Complaint  Patient presents with   Medical Management of Chronic Issues    HPI:  Pt is a 83 y.o. female seen today for medical management of chronic diseases.    Enrolled in hospice care due to progressive dementia and chronic pain  related issues. Needs assistance with all ADLs. Able to communicate needs but needs cuing and prompting. Spends most of the time in her WC. Progression in memory loss.   Anxiety: has periods of anxiety and is using xanax  schedule. Also  on depakote by hospice due to agitation in the afternoon hours.   Vascular dementia: Hx of TIA off plavix  per hospice, now on asa.   Off aricept  due to weight loss. On namenda  MMSE 13/30 08/13/23.  BIMs 99 PHq2 0  Weight gain: has gained 20 lbs in the past year.   Has chronic back and hip pain. Prior hx of laminectomy 2022.  March 2024 MRI of hips Tear of the left gluteus minimus tendon High grade parital near complete tear of the right gluteus minimus tendon insertion  Also has a hx of sciatica with prior surgery and injections.  Currently on neurontin  BID. No reports of pain.  Hypothyroidism: on levothyroxine   TSH 3.01 08/21/24  Constipation: bowels moving on miralax . Changed to every other day due to loose stools in November. Imodium  not needed.   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 Street  of back injections by Dr. Letha 2016 with good relief, history of cervical DD with possiblity of surgery by Dr. Malcolm.   GERD (gastroesophageal reflux disease)    IBS with moderate refluc seen on upper GI study form January 2011 Dr. Rosalie   HTN (hypertension)    no meds   Hyperlipidemia    Hypothyroidism    IBS (irritable bowel syndrome)    Imbalance    Impingement syndrome of right  shoulder 01/05/2013   Mass of left side of neck    Memory loss    Mood disorder    MVP (mitral valve prolapse)    Hx of    Osteopenia    Peripheral edema    Right rotator cuff tear 01/05/2013   Sciatic pain    recurrent   Seasonal allergies    Stroke [facility])    Tension headache    TIA (transient ischemic attack)    Tremor    Past Surgical History:  Procedure Laterality Date   ANTERIOR CERVICAL DECOMP/DISCECTOMY FUSION  04/2015    Dr. Samule, [facility]   CARDIAC CATHETERIZATION  2004   which revealed smooth and normal coronary arteries   CARPAL TUNNEL RELEASE  2000   lt   CATARACT EXTRACTION W/ INTRAOCULAR LENS  IMPLANT, BILATERAL  1/17   Dr. Meridee   COSMETIC SURGERY  2010   eyes-facial   DENTAL SURGERY  1960   LASIK     LOOP RECORDER INSERTION N/A 09/22/2017   Procedure: LOOP RECORDER INSERTION;  Surgeon: Inocencio Soyla Lunger, MD;  Location: [facility] INVASIVE CV LAB;  Service: Cardiovascular;  Laterality: N/A;   LUMBAR LAMINECTOMY/DECOMPRESSION MICRODISCECTOMY N/A 06/08/2021   Procedure: Laminectomy and Foraminotomy - L4-L5, L5-S1;  Surgeon: Louis Shove, MD;  Location: [facility] OR;  Service: Neurosurgery;  Laterality: N/A;   SHOULDER ARTHROSCOPY WITH ROTATOR CUFF REPAIR Right 01/05/2013   Procedure: SHOULDER ARTHROSCOPY WITH ARTHROSCOPIC  ROTATOR CUFF REPAIR, ACROMIOPLASTY, EXTENSIVE DEBRIDEMENT;  Surgeon: Fonda SHAUNNA Olmsted, MD;  Location:  SURGERY CENTER;  Service: Orthopedics;  Laterality: Right;   TEE WITHOUT CARDIOVERSION N/A 09/22/2017   Procedure: TRANSESOPHAGEAL ECHOCARDIOGRAM (TEE);  Surgeon: Francyne Headland, MD;  Location: [facility] ENDOSCOPY;  Service: Cardiovascular;  Laterality: N/A;   TONSILLECTOMY     TUBAL LIGATION      Allergies  Allergen Reactions   Fluticasone Other (See Comments)    DRY EYES   Aciphex [Rabeprazole] Other (See Comments)    Critical    Codeine Nausea Only   Codeine Other (See Comments)    unknown   Cymbalta [Duloxetine Hcl] Other (See Comments)     Critical    Lexapro [Escitalopram] Other (See Comments)    critical   Lipitor [Atorvastatin  Calcium ] Other (See Comments)    Intolerant    Morphine  Sulfate     Per matrix   Pneumovax [Pneumococcal Polysaccharide Vaccine] Other (See Comments)    moderate   Prevacid [Lansoprazole] Other (See Comments)    Unknown, listed on MAR   Rosuvastatin  Other (See Comments)    Intolerant    Sertraline  Other (See Comments)    critical   Statins Other (See Comments)    Critical    Sulfa Antibiotics Nausea And Vomiting   Tetracyclines & Related Other (See Comments)    Morphine  Sulfate   Welchol [Colesevelam] Other (See Comments)    Unknown, listed on MAR   Pneumovax 23 [Pneumococcal Vac Polyvalent] Rash    Injection site reaction    Outpatient Encounter Medications as of 09/13/2024  Medication  Sig   acetaminophen  (TYLENOL ) 325 MG tablet Take 650 mg by mouth every 6 (six) hours as needed.   aspirin  81 MG chewable tablet Chew 81 mg by mouth daily.   diclofenac Sodium (VOLTAREN) 1 % GEL Apply 4 g topically every 8 (eight) hours as needed (pain).   divalproex (DEPAKOTE SPRINKLE) 125 MG capsule Take 125 mg by mouth at bedtime.   gabapentin  (NEURONTIN ) 100 MG capsule Take 100 mg by mouth 2 (two) times daily.   levothyroxine  (SYNTHROID ) 75 MCG tablet Take 75 mcg by mouth daily before breakfast.   loperamide  (IMODIUM  A-D) 2 MG tablet Take 1 tablet (2 mg total) by mouth every hour as needed for diarrhea or loose stools. For a max of 16 mg per day   memantine (NAMENDA) 5 MG tablet Take 5 mg by mouth 2 (two) times daily.   omeprazole  (PRILOSEC) 40 MG capsule Take 40 mg by mouth daily.   oxybutynin  (DITROPAN ) 5 MG tablet Take 1 tablet (5 mg total) by mouth daily.   polyethylene glycol (MIRALAX ) 17 g packet Take 17 g by mouth every other day.   propranolol  (INDERAL ) 20 MG tablet Take 20 mg by mouth 2 (two) times daily. 1 tablet (20 mg) oral, Twice A Day   [DISCONTINUED] ALPRAZolam  (XANAX ) 0.25 MG  tablet Take 1 tablet (0.25 mg total) by mouth 2 (two) times daily as needed for anxiety.   ALPRAZolam  (XANAX ) 0.25 MG tablet Take 1 tablet (0.25 mg total) by mouth 2 (two) times daily.   [DISCONTINUED] polyethylene glycol (MIRALAX ) 17 g packet Take 17 g by mouth daily.   No facility-administered encounter medications on file as of 09/13/2024.    Review of Systems  Constitutional:  Negative for activity change, appetite change, chills, diaphoresis, fatigue and fever.  HENT:  Negative for congestion, ear pain, postnasal drip, rhinorrhea, sore throat and trouble swallowing.   Respiratory:  Negative for cough, shortness of breath and wheezing.   Cardiovascular:  Positive for leg swelling. Negative for chest pain.  Gastrointestinal:  Negative for abdominal distention, constipation, diarrhea and vomiting.  Genitourinary:  Negative for difficulty urinating, flank pain, frequency and pelvic pain.  Musculoskeletal:  Positive for gait problem. Negative for arthralgias and back pain.  Skin:  Negative for rash and wound.  Psychiatric/Behavioral:  Positive for confusion. Negative for agitation and behavioral problems.     Immunization History  Administered Date(s) Administered   Fluad Quad(high Dose 65+) 07/02/2022   Fluad Trivalent(High Dose 65+) 07/19/2023   INFLUENZA, HIGH DOSE SEASONAL PF 07/23/2017, 07/03/2019   Influenza-Unspecified 05/27/2010, 08/03/2010, 07/22/2011, 07/01/2012, 06/22/2013, 07/18/2020, 07/11/2021, 07/02/2022, 07/19/2023, 06/26/2024   Janssen (J&J) SARS-COV-2 Vaccination 11/06/2019   Moderna Covid-19 Vaccine  Bivalent Booster 65yrs & up 07/19/2023   Moderna SARS-COV2 Booster Vaccination 08/05/2021   Moderna Sars-Covid-2 Vaccination 08/07/2020   PFIZER(Purple Top)SARS-COV-2 Vaccination 10/09/2019, 08/07/2020   Pfizer Covid-19 Vaccine Bivalent Booster 13yrs & up 01/21/2023   Pneumococcal Conjugate-13 02/08/2015   Pneumococcal Polysaccharide-23 05/28/2006, 10/13/2011    Pneumococcal-Unspecified 10/17/2013   Td 03/29/2002   Tdap 09/27/2001, 04/25/2002, 08/18/2012, 10/17/2013, 02/07/2024   Unspecified SARS-COV-2 Vaccination 06/26/2024   Zoster Recombinant(Shingrix) 05/23/2021, 07/23/2021   Zoster, Live 08/30/2006   Zoster, Unspecified 08/30/2006   Pertinent  Health Maintenance Due  Topic Date Due   Influenza Vaccine  Completed   Bone Density Scan  Completed      08/07/2022   11:17 AM 08/07/2022   11:03 PM 08/08/2022    8:00 PM 08/09/2022    8:00 AM 08/17/2022  10:59 AM  Fall Risk  Falls in the past year?     1  Was there an injury with Fall?     1   Fall Risk Category Calculator     3  Fall Risk Category (Retired)     High   (RETIRED) Patient Fall Risk Level High fall risk  High fall risk  High fall risk  High fall risk  High fall risk   Patient at Risk for Falls Due to     History of fall(s);Impaired balance/gait;Impaired mobility  Fall risk Follow up     Falls evaluation completed      Data saved with a previous flowsheet row definition   Functional Status Survey:    Vitals:   09/16/24 0727  BP: 119/75  Pulse: 71  Resp: 18  Temp: (!) 97.5 F (36.4 C)  SpO2: 94%  Weight: 168 lb 6.4 oz (76.4 kg)   Body mass index is 31.82 kg/m. Wt Readings from Last 3 Encounters:  09/16/24 168 lb 6.4 oz (76.4 kg)  08/20/24 166 lb (75.3 kg)  07/16/24 166 lb (75.3 kg)    Physical Exam Vitals and nursing note reviewed.  Constitutional:      Appearance: Normal appearance.  HENT:     Head: Normocephalic and atraumatic.  Cardiovascular:     Rate and Rhythm: Normal rate and regular rhythm.     Heart sounds: No murmur heard. Pulmonary:     Effort: Pulmonary effort is normal. No respiratory distress.     Breath sounds: Normal breath sounds. No wheezing.  Abdominal:     General: Bowel sounds are normal. There is no distension.     Palpations: Abdomen is soft.     Tenderness: There is no abdominal tenderness.  Musculoskeletal:     Cervical  back: No rigidity.     Comments: BLE edema slight non pitting   Lymphadenopathy:     Cervical: No cervical adenopathy.  Skin:    General: Skin is warm and dry.  Neurological:     General: No focal deficit present.     Mental Status: She is alert. Mental status is at baseline.  Psychiatric:        Mood and Affect: Mood normal.     Labs reviewed: No results for input(s): NA, K, CL, CO2, GLUCOSE, BUN, CREATININE, CALCIUM , MG, PHOS in the last 8760 hours. No results for input(s): AST, ALT, ALKPHOS, BILITOT, PROT, ALBUMIN in the last 8760 hours. No results for input(s): WBC, NEUTROABS, HGB, HCT, MCV, PLT in the last 8760 hours. Lab Results  Component Value Date   TSH 0.76 10/13/2023   Lab Results  Component Value Date   HGBA1C 5.6 09/21/2017   Lab Results  Component Value Date   CHOL 174 03/23/2022   HDL 66 03/23/2022   LDLCALC 81 03/23/2022   TRIG 134 03/23/2022   CHOLHDL 2.1 09/21/2017    Significant Diagnostic Results in last 30 days:  No results found.  Assessment/Plan  Weight gain BNP normal, TSH normal Likely due to meds and sedentary lifestyle     Vascular dementia without behavioral disturbance (HCC) Progressive decline in cognition and physical function c/w the disease. Continue supportive care in the skilled environment. On namenda On asa  Continue depakote for sundowning.   Overactive bladder No current issues on ditropan    Hypothyroidism On levothyroxine   Generalized anxiety disorder Using depakote and xanax  Mood is stable   Gastroesophageal reflux disease without esophagitis ON PPI therapy Hx of IBS  also has seen GI No current issues.   Lumbar stenosis with neurogenic claudication Chronic pain is controlled with tylenol  and gabapentin         "

## 2024-09-16 NOTE — Assessment & Plan Note (Signed)
 BNP normal, TSH normal Likely due to meds and sedentary lifestyle

## 2024-09-16 NOTE — Assessment & Plan Note (Signed)
-  On levothyroxine

## 2024-09-16 NOTE — Assessment & Plan Note (Signed)
 Chronic pain is controlled with tylenol  and gabapentin 

## 2024-10-01 ENCOUNTER — Other Ambulatory Visit: Payer: Self-pay | Admitting: Adult Health

## 2024-10-01 MED ORDER — ALPRAZOLAM 0.25 MG PO TABS: 0.2500 mg | ORAL_TABLET | Freq: Two times a day (BID) | ORAL | 5 refills | Status: AC

## 2024-10-15 ENCOUNTER — Encounter: Payer: Self-pay | Admitting: Adult Health

## 2024-10-15 ENCOUNTER — Non-Acute Institutional Stay (SKILLED_NURSING_FACILITY): Payer: Self-pay | Admitting: Adult Health

## 2024-10-15 DIAGNOSIS — F01B4 Vascular dementia, moderate, with anxiety: Secondary | ICD-10-CM

## 2024-10-15 DIAGNOSIS — F411 Generalized anxiety disorder: Secondary | ICD-10-CM

## 2024-10-15 DIAGNOSIS — R609 Edema, unspecified: Secondary | ICD-10-CM | POA: Insufficient documentation

## 2024-10-15 DIAGNOSIS — R600 Localized edema: Secondary | ICD-10-CM

## 2024-10-15 DIAGNOSIS — E039 Hypothyroidism, unspecified: Secondary | ICD-10-CM

## 2024-10-15 DIAGNOSIS — M48062 Spinal stenosis, lumbar region with neurogenic claudication: Secondary | ICD-10-CM | POA: Diagnosis not present

## 2024-10-15 NOTE — Progress Notes (Signed)
 " Location:  [facility] Room Number: 146 P Place of Service:  SNF ([phone]) Provider:  Tawni America, NP   Patient Care Team: Charlanne Fredia CROME, MD as PCP - General (Internal Medicine) Nahser, Aleene PARAS, MD (Inactive) as PCP - Cardiology (Cardiology) Rosalie Kitchens, MD as Consulting Physician (Gastroenterology) Onita Duos, MD as Consulting Physician (Neurology) Colon Shove, MD as Consulting Physician (Neurosurgery) Jordan, Amy, MD as Consulting Physician (Dermatology) Cleotilde Ronal RAMAN, MD as Consulting Physician (Gynecology) Oman, Heather, OD (Optometry) Charlanne Fredia CROME, MD (Internal Medicine)  Extended Emergency Contact Information Primary Emergency Contact: Sann,Robert J Address: [address]          [place], [place] [address] [place] Home Phone: [phone] Work Phone: [phone] Mobile Phone: [phone] Relation: Spouse Secondary Emergency Contact: Rea Mickey Lamar PARAS  [place] Home Phone: [phone] Mobile Phone: [phone] Relation: Son  Code Status: DNR Goals of care: Advanced Directive information    04/20/2024   11:14 AM  Advanced Directives  Does Patient Have a Medical Advance Directive? Yes  Type of Estate Agent of [place];Out of facility DNR (pink MOST or yellow form);Living will  Does patient want to make changes to medical advance directive? No - Patient declined  Copy of Healthcare Power of Attorney in Chart? Yes - validated most recent copy scanned in chart (See row information)  Pre-existing out of facility DNR order (yellow form or pink MOST form) Pink MOST/Yellow Form most recent copy in chart - Physician notified to receive inpatient order     Chief Complaint  Patient presents with   Routine Visit    HPI:  Pt is a 84 y.o. female seen today for medical management of chronic diseases.    Enrolled in hospice care due to progressive dementia and chronic pain  related issues. Needs assistance with all ADLs. Able to communicate needs but needs cuing and prompting. Spends most of the time in her WC. Progression in memory loss.   Anxiety: has periods of anxiety and is using xanax  schedule. Also  on depakote by hospice due to agitation in the afternoon hours.   Vascular dementia: Hx of TIA off plavix  per hospice, now on asa.   Off aricept  due to weight loss. On namenda  MMSE 13/30 08/12/24  Weight gain: progressive gain over the past year, stabilized now.   Has chronic back and hip pain. Prior hx of laminectomy 2022.  March 2024 MRI of hips Tear of the left gluteus minimus tendon High grade parital near complete tear of the right gluteus minimus tendon insertion  Also has a hx of sciatica with prior surgery and injections.  Currently on neurontin  BID. No reports of pain.  Hypothyroidism: on levothyroxine   TSH 3.01 08/21/24  Constipation: bowels moving on miralax . Changed to every other day due to loose stools in November. Imodium  not needed.   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   Volatren ordered prn for shoulder pain  Imodium  was ordered for diarrhea not used.   Edema to BLE, using compression hose.   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 Street of back injections by Dr. Letha 2016 with good relief, history of cervical DD with possiblity of surgery by Dr. Malcolm.   GERD (gastroesophageal reflux disease)    IBS with moderate refluc seen on upper GI study form January 2011 Dr. Rosalie   HTN (hypertension)    no meds   Hyperlipidemia    Hypothyroidism    IBS (irritable bowel  syndrome)    Imbalance    Impingement syndrome of right shoulder 01/05/2013   Mass of left side of neck    Memory loss    Mood disorder    MVP (mitral valve prolapse)    Hx of    Osteopenia    Peripheral edema    Right rotator cuff tear 01/05/2013   Sciatic pain    recurrent   Seasonal allergies    Stroke [facility])    Tension headache    TIA (transient ischemic attack)    Tremor    Past Surgical History:  Procedure Laterality Date   ANTERIOR CERVICAL DECOMP/DISCECTOMY FUSION  04/2015    Dr. Samule, [facility]   CARDIAC CATHETERIZATION  2004   which revealed smooth and normal coronary arteries   CARPAL TUNNEL RELEASE  2000   lt   CATARACT EXTRACTION W/ INTRAOCULAR LENS  IMPLANT, BILATERAL  1/17   Dr. Meridee   COSMETIC SURGERY  2010   eyes-facial   DENTAL SURGERY  1960   LASIK     LOOP RECORDER INSERTION N/A 09/22/2017   Procedure: LOOP RECORDER INSERTION;  Surgeon: Inocencio Soyla Lunger, MD;  Location: [facility] INVASIVE CV LAB;  Service: Cardiovascular;  Laterality: N/A;   LUMBAR LAMINECTOMY/DECOMPRESSION MICRODISCECTOMY N/A 06/08/2021   Procedure: Laminectomy and Foraminotomy - L4-L5, L5-S1;  Surgeon: Louis Shove, MD;  Location: [facility] OR;  Service: Neurosurgery;  Laterality: N/A;   SHOULDER ARTHROSCOPY WITH ROTATOR CUFF REPAIR Right 01/05/2013   Procedure: SHOULDER ARTHROSCOPY WITH ARTHROSCOPIC  ROTATOR CUFF REPAIR, ACROMIOPLASTY, EXTENSIVE DEBRIDEMENT;  Surgeon: Fonda SHAUNNA Olmsted, MD;  Location: [facility];  Service: Orthopedics;  Laterality: Right;   TEE WITHOUT CARDIOVERSION N/A 09/22/2017   Procedure: TRANSESOPHAGEAL ECHOCARDIOGRAM (TEE);  Surgeon: Francyne Headland, MD;  Location: [facility] ENDOSCOPY;  Service: Cardiovascular;  Laterality: N/A;   TONSILLECTOMY     TUBAL LIGATION      Allergies[1]  Outpatient Encounter Medications as of 10/15/2024  Medication Sig   acetaminophen  (TYLENOL ) 325 MG tablet Take 650 mg by mouth every 6 (six) hours as needed.   ALPRAZolam  (XANAX )  0.25 MG tablet Take 1 tablet (0.25 mg total) by mouth 2 (two) times daily.   ALPRAZolam  (XANAX ) 0.25 MG tablet Take 0.25 mg by mouth every 12 (twelve) hours as needed for anxiety. Give 0.25 mg by mouth every 12 hours as needed for anxiety Alprazolam  0.25 mg PO BID PRN   aspirin  81 MG chewable tablet Chew 81 mg by mouth daily.   diclofenac Sodium (VOLTAREN) 1 % GEL Apply 4 g topically every 8 (eight) hours as needed (pain).   divalproex (DEPAKOTE SPRINKLE) 125 MG capsule Take 125 mg by mouth at bedtime.   gabapentin  (NEURONTIN ) 100 MG capsule Take 100 mg by mouth 2 (two) times daily.   levothyroxine  (SYNTHROID ) 75 MCG tablet Take 75 mcg by mouth daily before breakfast.   loperamide  (IMODIUM  A-D) 2 MG tablet Take 1 tablet (2 mg total) by mouth every hour as needed for diarrhea or loose stools. For a max of 16 mg per day   memantine (NAMENDA) 5  MG tablet Take 5 mg by mouth 2 (two) times daily.   omeprazole  (PRILOSEC) 40 MG capsule Take 40 mg by mouth daily.   oxybutynin  (DITROPAN ) 5 MG tablet Take 1 tablet (5 mg total) by mouth daily.   polyethylene glycol (MIRALAX ) 17 g packet Take 17 g by mouth every other day.   propranolol  (INDERAL ) 20 MG tablet Take 20 mg by mouth 2 (two) times daily. 1 tablet (20 mg) oral, Twice A Day   No facility-administered encounter medications on file as of 10/15/2024.    Review of Systems  Constitutional:  Negative for activity change, appetite change, chills, diaphoresis, fatigue and fever.  HENT:  Negative for congestion.   Respiratory:  Negative for cough, shortness of breath and wheezing.   Cardiovascular:  Positive for leg swelling. Negative for chest pain.  Gastrointestinal:  Negative for abdominal distention, abdominal pain, constipation, diarrhea, nausea and vomiting.  Genitourinary:  Negative for difficulty urinating, dysuria and urgency.  Musculoskeletal:  Positive for arthralgias, back pain and gait problem. Negative for myalgias and neck pain.  Skin:   Negative for rash.  Neurological:  Negative for dizziness and weakness.  Psychiatric/Behavioral:  Negative for confusion. The patient is nervous/anxious.     Immunization History  Administered Date(s) Administered   Fluad Quad(high Dose 65+) 07/02/2022   Fluad Trivalent(High Dose 65+) 07/19/2023   INFLUENZA, HIGH DOSE SEASONAL PF 07/23/2017, 07/03/2019   Influenza-Unspecified 05/27/2010, 08/03/2010, 07/22/2011, 07/01/2012, 06/22/2013, 07/18/2020, 07/11/2021, 07/02/2022, 07/19/2023, 06/26/2024   Janssen (J&J) SARS-COV-2 Vaccination 11/06/2019   Moderna Covid-19 Vaccine  Bivalent Booster 37yrs & up 07/19/2023   Moderna SARS-COV2 Booster Vaccination 08/05/2021   Moderna Sars-Covid-2 Vaccination 08/07/2020   PFIZER(Purple Top)SARS-COV-2 Vaccination 10/09/2019, 08/07/2020   Pfizer Covid-19 Vaccine Bivalent Booster 78yrs & up 01/21/2023   Pneumococcal Conjugate-13 02/08/2015   Pneumococcal Polysaccharide-23 05/28/2006, 10/13/2011   Pneumococcal-Unspecified 10/17/2013   Td 03/29/2002   Tdap 09/27/2001, 04/25/2002, 08/18/2012, 10/17/2013, 02/07/2024   Unspecified SARS-COV-2 Vaccination 06/26/2024   Zoster Recombinant(Shingrix) 05/23/2021, 07/23/2021   Zoster, Live 08/30/2006   Zoster, Unspecified 08/30/2006   Pertinent  Health Maintenance Due  Topic Date Due   Influenza Vaccine  Completed   Bone Density Scan  Completed      08/07/2022   11:17 AM 08/07/2022   11:03 PM 08/08/2022    8:00 PM 08/09/2022    8:00 AM 08/17/2022   10:59 AM  Fall Risk  Falls in the past year?     1  Was there an injury with Fall?     1   Fall Risk Category Calculator     3  Fall Risk Category (Retired)     High   (RETIRED) Patient Fall Risk Level High fall risk  High fall risk  High fall risk  High fall risk  High fall risk   Patient at Risk for Falls Due to     History of fall(s);Impaired balance/gait;Impaired mobility  Fall risk Follow up     Falls evaluation completed      Data saved with a previous  flowsheet row definition   Functional Status Survey:    Vitals:   10/15/24 1149  BP: 132/80  Pulse: 67  Resp: 20  Temp: (!) 97.1 F (36.2 C)  SpO2: 92%  Weight: 168 lb 3.2 oz (76.3 kg)  Height: 5' 1 (1.549 m)   Body mass index is 31.78 kg/m. Wt Readings from Last 3 Encounters:  10/15/24 168 lb 3.2 oz (76.3 kg)  09/16/24 168 lb 6.4 oz (76.4 kg)  08/20/24 166 lb (75.3 kg)    Physical Exam Vitals and nursing note reviewed.  Constitutional:      General: She is not in acute distress.    Appearance: Normal appearance. She is not diaphoretic.  HENT:     Head: Normocephalic and atraumatic.  Neck:     Thyroid : No thyromegaly.     Vascular: No carotid bruit or JVD.  Cardiovascular:     Rate and Rhythm: Normal rate and regular rhythm.     Heart sounds: Normal heart sounds. No murmur heard. Pulmonary:     Effort: Pulmonary effort is normal. No respiratory distress.     Breath sounds: Normal breath sounds. No stridor.  Abdominal:     General: Bowel sounds are normal. There is no distension.     Palpations: Abdomen is soft.     Tenderness: There is no abdominal tenderness.  Musculoskeletal:     Cervical back: No rigidity. No muscular tenderness.     Right lower leg: Edema present.     Left lower leg: Edema present.  Lymphadenopathy:     Cervical: No cervical adenopathy.  Skin:    General: Skin is warm and dry.  Neurological:     General: No focal deficit present.     Mental Status: She is alert. Mental status is at baseline.     Cranial Nerves: No cranial nerve deficit.  Psychiatric:        Mood and Affect: Mood normal.     Labs reviewed: No results for input(s): NA, K, CL, CO2, GLUCOSE, BUN, CREATININE, CALCIUM , MG, PHOS in the last 8760 hours. No results for input(s): AST, ALT, ALKPHOS, BILITOT, PROT, ALBUMIN in the last 8760 hours. No results for input(s): WBC, NEUTROABS, HGB, HCT, MCV, PLT in the last 8760 hours. Lab  Results  Component Value Date   TSH 3.01 08/21/2024   Lab Results  Component Value Date   HGBA1C 5.6 09/21/2017   Lab Results  Component Value Date   CHOL 174 03/23/2022   HDL 66 03/23/2022   LDLCALC 81 03/23/2022   TRIG 134 03/23/2022   CHOLHDL 2.1 09/21/2017    Significant Diagnostic Results in last 30 days:  No results found.  Assessment/Plan  Vascular dementia with anxiety (HCC) Progressive decline in cognition and physical function c/w the disease. Continue supportive care in the skilled environment. On namenda On asa  Continue depakote for sundowning.  Continue xanax  per hospice  Lumbar stenosis with neurogenic claudication Chronic pain is controlled with tylenol  and gabapentin   Hypothyroidism On levothyroxine   Generalized anxiety disorder Using depakote and xanax  Mood is stable   Edema Compression hose.         [1]  Allergies Allergen Reactions   Fluticasone Other (See Comments)    DRY EYES   Aciphex [Rabeprazole] Other (See Comments)    Critical    Codeine Nausea Only   Codeine Other (See Comments)    unknown   Cymbalta [Duloxetine Hcl] Other (See Comments)    Critical    Lexapro [Escitalopram] Other (See Comments)    critical   Lipitor [Atorvastatin  Calcium ] Other (See Comments)    Intolerant    Morphine  Sulfate     Per matrix   Pneumovax [Pneumococcal Polysaccharide Vaccine] Other (See Comments)    moderate   Prevacid [Lansoprazole] Other (See Comments)    Unknown, listed on MAR   Rosuvastatin  Other (See Comments)    Intolerant    Sertraline  Other (See Comments)    critical  Statins Other (See Comments)    Critical    Sulfa Antibiotics Nausea And Vomiting   Tetracyclines & Related Other (See Comments)    Morphine  Sulfate   Welchol [Colesevelam] Other (See Comments)    Unknown, listed on MAR   Pneumovax 23 [Pneumococcal Vac Polyvalent] Rash    Injection site reaction   "

## 2024-10-15 NOTE — Assessment & Plan Note (Signed)
 On levothyroxine

## 2024-10-15 NOTE — Assessment & Plan Note (Signed)
 Using depakote and xanax  Mood is stable

## 2024-10-15 NOTE — Assessment & Plan Note (Signed)
Compression hose.   

## 2024-10-15 NOTE — Assessment & Plan Note (Signed)
 Chronic pain is controlled with tylenol  and gabapentin 

## 2024-10-15 NOTE — Assessment & Plan Note (Signed)
 Progressive decline in cognition and physical function c/w the disease. Continue supportive care in the skilled environment. On namenda On asa  Continue depakote for sundowning.  Continue xanax  per hospice
